# Patient Record
Sex: Male | Born: 1940
Health system: Southern US, Community
[De-identification: ages and names within clinical notes are randomized; demographics above are authoritative.]

## PROBLEM LIST (undated history)

## (undated) DIAGNOSIS — Z950 Presence of cardiac pacemaker: Secondary | ICD-10-CM

## (undated) DIAGNOSIS — K635 Polyp of colon: Secondary | ICD-10-CM

## (undated) DIAGNOSIS — K5790 Diverticulosis of intestine, part unspecified, without perforation or abscess without bleeding: Secondary | ICD-10-CM

## (undated) DIAGNOSIS — I4891 Unspecified atrial fibrillation: Secondary | ICD-10-CM

## (undated) DIAGNOSIS — N184 Chronic kidney disease, stage 4 (severe): Secondary | ICD-10-CM

## (undated) DIAGNOSIS — N400 Enlarged prostate without lower urinary tract symptoms: Secondary | ICD-10-CM

## (undated) DIAGNOSIS — C649 Malignant neoplasm of unspecified kidney, except renal pelvis: Secondary | ICD-10-CM

## (undated) DIAGNOSIS — E785 Hyperlipidemia, unspecified: Secondary | ICD-10-CM

## (undated) DIAGNOSIS — K219 Gastro-esophageal reflux disease without esophagitis: Secondary | ICD-10-CM

## (undated) DIAGNOSIS — C679 Malignant neoplasm of bladder, unspecified: Secondary | ICD-10-CM

## (undated) DIAGNOSIS — E8729 Other acidosis: Secondary | ICD-10-CM

## (undated) DIAGNOSIS — I10 Essential (primary) hypertension: Secondary | ICD-10-CM

## (undated) DIAGNOSIS — I447 Left bundle-branch block, unspecified: Secondary | ICD-10-CM

## (undated) DIAGNOSIS — I2510 Atherosclerotic heart disease of native coronary artery without angina pectoris: Secondary | ICD-10-CM

## (undated) DIAGNOSIS — I495 Sick sinus syndrome: Secondary | ICD-10-CM

## (undated) DIAGNOSIS — I480 Paroxysmal atrial fibrillation: Secondary | ICD-10-CM

## (undated) HISTORY — DX: Benign prostatic hyperplasia without lower urinary tract symptoms: N40.0

## (undated) HISTORY — DX: Essential (primary) hypertension: I10

## (undated) HISTORY — DX: Gastro-esophageal reflux disease without esophagitis: K21.9

## (undated) HISTORY — DX: Polyp of colon: K63.5

## (undated) HISTORY — DX: Malignant neoplasm of bladder, unspecified: C67.9

## (undated) HISTORY — PX: BOWEL RESECTION: SHX1257

## (undated) HISTORY — PX: ROBOT ASSISTED LAPAROSCOPIC COMPLETE CYSTECT ILEAL CONDUIT: SHX5139

## (undated) HISTORY — DX: Diverticulosis of intestine, part unspecified, without perforation or abscess without bleeding: K57.90

## (undated) HISTORY — DX: Malignant neoplasm of unspecified kidney, except renal pelvis: C64.9

## (undated) HISTORY — DX: Unspecified atrial fibrillation: I48.91

## (undated) HISTORY — DX: Hyperlipidemia, unspecified: E78.5

## (undated) HISTORY — PX: APPENDECTOMY: SHX54

---

## 2001-08-11 ENCOUNTER — Ambulatory Visit (HOSPITAL_COMMUNITY): Admission: RE | Admit: 2001-08-11 | Discharge: 2001-08-11 | Payer: Self-pay | Admitting: Gastroenterology

## 2003-09-24 ENCOUNTER — Encounter: Admission: RE | Admit: 2003-09-24 | Discharge: 2003-09-24 | Payer: Self-pay | Admitting: Internal Medicine

## 2005-01-25 HISTORY — PX: NEPHRECTOMY: SHX65

## 2006-01-25 DIAGNOSIS — C649 Malignant neoplasm of unspecified kidney, except renal pelvis: Secondary | ICD-10-CM

## 2006-01-25 HISTORY — PX: BLADDER SURGERY: SHX569

## 2006-01-25 HISTORY — DX: Malignant neoplasm of unspecified kidney, except renal pelvis: C64.9

## 2006-01-27 ENCOUNTER — Observation Stay (HOSPITAL_COMMUNITY): Admission: RE | Admit: 2006-01-27 | Discharge: 2006-01-28 | Payer: Self-pay | Admitting: Urology

## 2006-01-27 ENCOUNTER — Encounter (INDEPENDENT_AMBULATORY_CARE_PROVIDER_SITE_OTHER): Payer: Self-pay | Admitting: Specialist

## 2006-03-23 ENCOUNTER — Encounter: Admission: RE | Admit: 2006-03-23 | Discharge: 2006-03-23 | Payer: Self-pay | Admitting: Urology

## 2006-04-22 ENCOUNTER — Encounter (INDEPENDENT_AMBULATORY_CARE_PROVIDER_SITE_OTHER): Payer: Self-pay | Admitting: Specialist

## 2006-04-22 ENCOUNTER — Ambulatory Visit (HOSPITAL_BASED_OUTPATIENT_CLINIC_OR_DEPARTMENT_OTHER): Admission: RE | Admit: 2006-04-22 | Discharge: 2006-04-22 | Payer: Self-pay | Admitting: Urology

## 2006-04-23 ENCOUNTER — Inpatient Hospital Stay (HOSPITAL_COMMUNITY): Admission: AD | Admit: 2006-04-23 | Discharge: 2006-04-24 | Payer: Self-pay | Admitting: Urology

## 2006-07-19 ENCOUNTER — Encounter (INDEPENDENT_AMBULATORY_CARE_PROVIDER_SITE_OTHER): Payer: Self-pay | Admitting: Urology

## 2006-07-19 ENCOUNTER — Inpatient Hospital Stay (HOSPITAL_COMMUNITY): Admission: RE | Admit: 2006-07-19 | Discharge: 2006-07-22 | Payer: Self-pay | Admitting: Urology

## 2006-07-27 ENCOUNTER — Ambulatory Visit (HOSPITAL_COMMUNITY): Admission: RE | Admit: 2006-07-27 | Discharge: 2006-07-27 | Payer: Self-pay | Admitting: Urology

## 2006-07-28 ENCOUNTER — Emergency Department (HOSPITAL_COMMUNITY): Admission: EM | Admit: 2006-07-28 | Discharge: 2006-07-29 | Payer: Self-pay | Admitting: Emergency Medicine

## 2006-08-10 ENCOUNTER — Ambulatory Visit (HOSPITAL_COMMUNITY): Admission: RE | Admit: 2006-08-10 | Discharge: 2006-08-10 | Payer: Self-pay | Admitting: Urology

## 2006-08-24 ENCOUNTER — Ambulatory Visit (HOSPITAL_COMMUNITY): Admission: RE | Admit: 2006-08-24 | Discharge: 2006-08-24 | Payer: Self-pay | Admitting: Urology

## 2007-12-06 ENCOUNTER — Ambulatory Visit: Payer: Self-pay | Admitting: Internal Medicine

## 2007-12-11 ENCOUNTER — Telehealth: Payer: Self-pay | Admitting: Internal Medicine

## 2007-12-13 ENCOUNTER — Encounter: Payer: Self-pay | Admitting: Internal Medicine

## 2007-12-13 ENCOUNTER — Ambulatory Visit: Payer: Self-pay | Admitting: Internal Medicine

## 2007-12-15 ENCOUNTER — Encounter: Payer: Self-pay | Admitting: Internal Medicine

## 2009-01-16 DIAGNOSIS — K219 Gastro-esophageal reflux disease without esophagitis: Secondary | ICD-10-CM | POA: Diagnosis present

## 2009-01-25 HISTORY — PX: NEPHROSTOMY: SHX1014

## 2009-01-29 DIAGNOSIS — E785 Hyperlipidemia, unspecified: Secondary | ICD-10-CM | POA: Diagnosis present

## 2010-01-25 HISTORY — PX: OTHER SURGICAL HISTORY: SHX169

## 2010-06-09 NOTE — H&P (Signed)
NAME:  Jordan Oconnell, Jordan Oconnell             ACCOUNT NO.:  [id number]   MEDICAL RECORD NO.:  [id number]          PATIENT TYPE:  INP   LOCATION:  1228                         FACILITY:  [facility]   PHYSICIAN:  Domingo Pulse, M.D.  DATE OF BIRTH:  Apr 02, 1940   DATE OF ADMISSION:  07/19/2006  DATE OF DISCHARGE:                              HISTORY & PHYSICAL   CHIEF COMPLAINT:  1. Renal mass.  2. History of bladder cancer.   HISTORY OF PRESENT ILLNESS:  Mr. Dottery is a 70 year old gentleman who  has a history of bladder cancer.  He is status post resection and  treatment with BCG treatment.  His most recent resection was negative  for any residual tumor.  The patient has had a staging evaluation which  revealed the presence of a small tumor on the upper pole of the left  kidney which is worrisome for a renal cell carcinoma.  He is admitted  this hospitalization for surgical management.   The patient states he has been doing well with no recent changes in his  medical history.  He has not had any significant hematuria or trouble  urinating.  He has not had any back or flank pain.   ALLERGIES:  None.   MEDICATIONS:  1. Lisinopril 20 mg daily.  2. Prilosec 20 mg daily.  3. Detrol 4 mg daily.  4. Potassium gluconate 550 mg daily.  5. Ibuprofen as needed.   PAST MEDICAL HISTORY:  1. Bladder cancer.  2. Hypertension.  3. Gastroesophageal reflux disease.   PAST SURGICAL HISTORY:  1. TURBT.  2. Rotator cuff surgery.   FAMILY MEDICAL HISTORY:  Significant for colon cancer in his father and  ovarian cancer in his mother.  There is no history of GU malignancy or  kidney stones.   SOCIAL HISTORY:  The patient denies any current or past use of tobacco  products.  Denies any alcohol.   REVIEW OF SYSTEMS:  Multisystem review was performed is as above in the  history of present illness.  Otherwise reviewed and negative.   EXAMINATION ON ADMISSION:  VITAL SIGNS:  Vital signs were reviewed.   The  patient is afebrile with stable vital signs.  GENERAL:  Well-developed, well-nourished white male in no acute  distress.  HEENT:  Normocephalic, atraumatic.  NECK:  Supple without lymphadenopathy.  CARDIOVASCULAR:  Regular rate and rhythm.  PULMONARY:  Clear to auscultation bilaterally.  ABDOMEN:  Soft, nontender, nondistended.  Normoactive bowel sounds.  GENITOURINARY:  Reveals a normal circumcised phallus.  Both testicles  are descended bilaterally.  EXTREMITIES:  There is no cyanosis, clubbing or edema.  PSYCHIATRIC:  Alert and oriented x3.   IMAGING STUDIES:  CT scan reveals presence of an approximately 2-1/2 cm  upper pole lesion in the left kidney which is enhancing.  There is also  mild left hydronephrosis.  There is some mild left bladder wall  thickening which has decreased in size.  Otherwise negative.   ASSESSMENT/PLAN:  [age]-year-old gentleman with left renal mass,  history of bladder cancer, hypertension, gastroesophageal reflux  disease.   PLAN:  1. The patient  will be taken to the operating room for an attempt at      left partial nephrectomy.  The patient be admitted postoperatively      for routine care.     ______________________________  Peterson Lombard, MD      Domingo Pulse, M.D.  Electronically Signed    JH/MEDQ  D:  07/19/2006  T:  07/19/2006  Job:  [id number]

## 2010-06-09 NOTE — Op Note (Signed)
NAME:  Jordan Oconnell, Jordan Oconnell             ACCOUNT NO.:  [id number]   MEDICAL RECORD NO.:  [id number]          PATIENT TYPE:  INP   LOCATION:  1228                         FACILITY:  [facility]   PHYSICIAN:  Domingo Pulse, M.D.  DATE OF BIRTH:  28-Jun-1940   DATE OF PROCEDURE:  07/19/2006  DATE OF DISCHARGE:                               OPERATIVE REPORT   PREOPERATIVE DIAGNOSIS:  Left renal mass.   POSTOPERATIVE DIAGNOSES:  1. Left renal mass.  2. Bladder neck contracture.   PROCEDURES:  1. Cystoscopy.  2. Dilation of bladder neck contracture.  3. Left partial nephrectomy.   SURGEON:  Domingo Pulse, M.D.   ASSISTANT:  Peterson Lombard, MD.   ANESTHESIA:  General.   SPECIMEN:  Left renal mass for frozen and permanent analysis.   ESTIMATED BLOOD LOSS:  300 mL.   COMPLICATIONS:  None.   DRAINS:  18-French coude catheter, JP to bulb suction.   DISPOSITION:  Stable to post anesthesia care unit.   INDICATIONS FOR PROCEDURE:  Jordan Oconnell is a 70 year old gentleman who  has a history of bladder cancer.  He is status post TURBT with induction  of BCG.  His most recent re-resection was negative for any evidence of  tumor.  As part of his staging evaluation, he was found to have an  approximate 2.5 cm lesion in the medial aspect of the upper pole left  kidney which enhanced and was worrisome for a renal cell carcinoma.  He  was brought to the operating room today for surgical management of his  renal mass.  Full benefits and risks of the procedure were explained and  he has agreed to proceed.  Consent was obtained.   DESCRIPTION OF PROCEDURE:  The patient was brought to the operating room  and was properly identified.  Time-out was performed to confirm the  correct patient, procedure and side.  He was administered general  anesthesia given preoperative antibiotics.  Attempts at placement of  Foley catheter were unsuccessful.  It appeared that the catheter was  becoming blocked at the level  of the bladder neck.  Attempts with a  coude catheter also met with resistance.  We then performed flexible  cystoscopy to assess the urethra and bladder neck.  The anterior urethra  was normal.  Posterior urethra and bladder neck did exhibit a bladder  neck contracture which was approximately 14-French.  This would not  accept the scope.  We then placed a sensor wire under direct vision into  the bladder and the scope was removed.  We then serially dilated the  bladder neck using the male sounds over the wire to 28-French.  The  cystoscope was then reintroduced into the bladder.  We did obtain an  adequate dilation with minimal trauma.  We then performed a cystoscopic  examination of the bladder, given the patient's history of bladder  cancer.  There were no obvious recurrence of the tumor.  There were post  resection changes in the left lateral wall extending posteriorly.  There  did not appear to be any other evidence of tumor.  At this  point, the  cystoscope was removed and a coude catheter was placed easily with  return of clear urine.   The patient was then positioned in the left flank position and properly  padded to prevent compartment syndrome or peripheral neuropathy.  He was  then prepped and draped in the usual sterile manner.  We then began the  partial nephrectomy.  An incision was made just over the eleventh rib.  Dissection was carried down through the subcutaneous tissue and onto the  eleventh rib.  The 3 cm of the rib was freed, cleared off and the rib  was divided with the rib cutter.  We then continued through the  remaining layers.  Peritoneum was swept medially and we entered the  retroperitoneal space.  Kidney was identified as the retroperitoneal  space was developed.  We entered Gerota's fascia and dissected the  perinephric fat off of the kidney.  The kidney was mobilized inferiorly.  We were able to identify the mass coming off the medial aspect of the  upper  pole of the kidney.  This appeared to be about 3 cm in size.  The  adrenal gland and surrounding fat were reflected medially off of the  mass.  We then scored the surrounding capsule with the Bovie cautery.  The mass was then enucleated using the Bovie to remove the mass.  Bleeders were controlled with cautery as well as 3-0 Vicryl suture.  We  then sent the mass to pathology for frozen section.  All margins were  negative.  Preliminary results were consistent with a clear cell renal  cell carcinoma.  We then treated the resection bed with the argon beam.  Hemostasis was excellent at this point.  We then applied FloSeal,  Surgicel and the kidney edges were loosely reapproximated using 2-0  Vicryl over the Surgicel FloSeal bolster.  Tisseel was then applied over  the resection site.  The kidney was examined for several minutes with  excellent hemostasis.  The kidney was then returned to its anatomic  position.  The wound was filled with irrigant and on deep inspiration of  the lung, we did note a small pleurotomy.  This was oversewn using a 3-0  Vicryl suture to close after evacuating all the air in the pleural  space.  The irrigant test was then repeated without any further evidence  of pleural violation.  We then placed a 10 French Jackson-Pratt drain  through a separate stab incision and this was placed in the  retroperitoneum.  The incision was then closed in three layers.  First  layer with a 0 Vicryl.  Then the remaining internal and external layers  were closed with #1 PDS.  The wound was irrigated and the skin was  closed with staples.  Sterile dressing was applied.  The patient was  awoken from anesthesia and transported to the recovery room in stable  condition.  There was no complications.  Please note Dr. Amalia Hailey was  present and participated in all aspects of this procedure as he was  primary surgeon.     ______________________________  Peterson Lombard, MD      Domingo Pulse, M.D.  Electronically Signed    JH/MEDQ  D:  07/19/2006  T:  07/19/2006  Job:  [id number]

## 2010-06-09 NOTE — Discharge Summary (Signed)
NAME:  Jordan Oconnell, Jordan Oconnell             ACCOUNT NO.:  [id number]   MEDICAL RECORD NO.:  [id number]          PATIENT TYPE:  INP   LOCATION:  1426                         FACILITY:  [facility]   PHYSICIAN:  Domingo Pulse, M.D.  DATE OF BIRTH:  05-30-1940   DATE OF ADMISSION:  07/19/2006  DATE OF DISCHARGE:  07/22/2006                               DISCHARGE SUMMARY   ADMISSION DIAGNOSES:  1. Left renal mass.  2. History of bladder cancer.   DISCHARGE DIAGNOSES:  1. Left renal mass.  2. History of bladder cancer.   PROCEDURE PERFORMED:  1. Left partial nephrectomy  2. Cystoscopy with dilation of bladder neck contracture   HISTORY OF PRESENT ILLNESS:  For full details please see dictated  history and physical.  Briefly, Jordan Oconnell is a 70 year old male has a  history of bladder cancer as well as a left-sided renal mass.  He has  been treated with TURBT and BCG for his bladder cancer.  The most recent  resection was negative for evidence of residual tumor.  He has also had  a small approximately 2.5-cm tumor in the upper pole of the left kidney.  He is admitted this hospitalization for management of the renal mass.   HOSPITAL COURSE:  The patient was brought to the hospital on 06/24 and  taken to the operating room for left partial nephrectomy.  The patient  had difficulty with placement of Foley catheter and was found to have a  bladder neck contracture on cystoscopy.  This was dilated and a Foley  catheter was placed.  Cystoscopy at that time did not reveal any  evidence of recurrent tumor.  The patient underwent uneventful open left  partial nephrectomy.  He did have a small pneumothorax postop which was  managed conservatively with resolution on chest x-ray.  His  postoperative course was uncomplicated.  Diet was slowly advanced. He  ambulated without difficulty.  Pain was well controlled.  He did have  continued small amount of drainage from his a JP drain. It was elected  to leave  this in at discharge.   At the time of discharge the patient was afebrile with stable vital  signs.  GENERAL: He was in no acute distress.  HEART regular.  LUNGS clear.  ABDOMEN was soft, nontender.  INCISION was clean, dry and intact with staples in place.  The JP drain  had minimal amount of serous appearing fluid.  EXTREMITIES:  There is no cyanosis, clubbing or edema   DISCHARGE INSTRUCTIONS:  Mr. Yeck was discharged to home in stable and  improved condition.  He is instructed to resume his previous diet.  Instructed not to lift anything heavier than a gallon of milk for  approximately 6 weeks, instructed not to drive for 4 weeks.  Instructed  otherwise to increase his activity slowly and shower.  Instructed how to  record his a JP output and was instructed to call with any significant  increase in drainage amounts.  Was also instructed to call with any  fever greater than 101 or increasing redness or drainage from his  incision or  uncontrolled abdominal or flank pain, nausea, vomiting.  He  understood and agreed to all these instructions.   He will return to see Dr. Amalia Hailey in 1 week.   MEDICATIONS AT DISCHARGE:  1. Lisinopril 20 mg daily  2. Prilosec 20 mg daily.  3. Detrol LA 4 mg daily.  4. Potassium gluconate daily.  5. Ibuprofen as needed.  6. Percocet as needed for pain.  7. Thorazine as needed for hiccups.  8. Laxatives such as Dulcolax or milk of magnesia as needed.   Final pathology was clear cell renal cell carcinoma grade 4/4 with  negative margins.     ______________________________  Peterson Lombard, MD      Domingo Pulse, M.D.  Electronically Signed    JH/MEDQ  D:  07/22/2006  T:  07/22/2006  Job:  [id number]

## 2010-06-09 NOTE — Discharge Summary (Signed)
NAME:  Jordan Oconnell, Jordan Oconnell             ACCOUNT NO.:  [id number]   MEDICAL RECORD NO.:  [id number]          PATIENT TYPE:  INP   LOCATION:  1426                         FACILITY:  [facility]   PHYSICIAN:  Domingo Pulse, M.D.  DATE OF BIRTH:  06-09-1940   DATE OF ADMISSION:  07/19/2006  DATE OF DISCHARGE:  07/22/2006                               DISCHARGE SUMMARY   DISCHARGE DIAGNOSES:  1. Renal cell carcinoma, left kidney.  2. History of bladder cancer.  3. Hypertension.  4. Esophageal reflux disease.   PROCEDURE:  Left partial nephrectomy done on July 19, 2006.   HISTORY:  This 70 year old male has a history of bladder cancer.  As  part of his staging, he was found to have a tumor in the upper pole of  the left kidney felt to be suspicious for renal cell carcinoma.  The  patient has had a successful resection of the tumor and was treated with  BCG therapy.  He had a second look procedure with additional biopsies  taken with no signs of recurrent tumor.  His followup studies have not  shown any change in the size of the kidney tumor.  He is now to have  this resected.  We did give consideration to doing this laparoscopically  but since it is quite high on the left upper pole, we felt it was best  that this was done with an open procedure.  Full informed consent was  obtained.  The patient had the procedure done on the day of admission is  to be admitted following that procedure.   PAST MEDICAL HISTORY:  Is remarkable for:  1. Hypertension.  2. Esophageal reflux disease.   MEDICATIONS:  1. Lisinopril 20 mg daily.  .  Prilosec 20 mg daily.  1. Detrol LA 4 mg daily.  2. Potassium 550 mg daily.  3. Occasional ibuprofen.   Aside from his TURBT, he has had rotator cuff surgery.   FAMILY HISTORY:  Colon cancer and ovarian cancer.   SOCIAL HISTORY:  Unremarkable.  He does not use tobacco or alcohol.   REVIEW OF SYSTEMS:  Was negative and is on the office chart.   The patient was  taken to the operating room, where he underwent  successful exploration with removal of the left renal mass.  As part of  the procedure, he was found to have a tight bladder neck contracture  which was successfully dilated.  Cystoscopic examination showed no  evidence of recurrent tumor.  There were some inflammatory changes in  the area where the patient previously was resected but it was not felt  that there was any tumor present.  The patient's postoperative course  was unremarkable.  We noted on his followup chest x-ray there was a small pneumothorax but  this has begun to resolve.  An additional chest x-ray showed that this  was better and he has one followup x-ray which has not yet been read.  His laboratory studies were unremarkable.  His blood loss was minimally  and he did not require transfusion.  He had no changes in his  electrolytes.  The patient's Jackson-Pratt drain was minimal.  At one point there was a  notation from nursing that there had been a lot of drainage, however, I  checked with the patient and he assured me that there was no change and  this appears to have been placed in error.  In order to be certain that  there was no uncontrolled leakage from his kidney, he will be sent home  with the JP drain so that can be carefully monitored.  It appears that  the drainage right now is less than 100 mL per shift and certainly this  can be removed soon.  The drainage appears to be serous in nature with  no evidence of blood or urine.  The patient's vital signs remained  stable throughout.  He had excellent oxygenation on room air and we felt  that he was ready for discharge by July 22, 2006.  His pain was well-  controlled.  He had no problems with his incision.  He had not yet moved  his bowels, but there was really no signs of problems with that either.  The patient was sent home on July 22, 2006.   He has a prescription for Percocet.  I have also given him Thorazine  for  some hiccups that he has had since the procedure most likely due to some  irritation of the diaphragm, when he was operated upon.   1. He was given instructions in how to use the Jackson-Pratt drain.  2. He will be allowed to shower and resume regular activities,      although he will not do any heavy lifting or driving for several      weeks.  3. He was given prescriptions for the Thorazine and the Percocet as      noted above.  His other medications remain the same including      lisinopril, Prilosec Detrol, potassium, and his over-the-counter      ibuprofen.  4. He has also been given instructions about the use of laxatives.      Domingo Pulse, M.D.  Electronically Signed     RJE/MEDQ  D:  07/22/2006  T:  07/22/2006  Job:  [id number]

## 2010-06-09 NOTE — Op Note (Signed)
NAME:  Jordan Oconnell, Jordan Oconnell             ACCOUNT NO.:  [id number]   MEDICAL RECORD NO.:  [id number]          PATIENT TYPE:  AMB   LOCATION:  DAY                          FACILITY:  [facility]   PHYSICIAN:  Domingo Pulse, M.D.  DATE OF BIRTH:  May 11, 1940   DATE OF PROCEDURE:  DATE OF DISCHARGE:                               OPERATIVE REPORT   PREOPERATIVE DIAGNOSIS:  Urinoma, status post left partial nephrectomy.   POSTOPERATIVE DIAGNOSIS:  Urinoma, status post left partial nephrectomy.   PROCEDURES:  1. Cystoscopy.  2. Left retrograde.  3. Left double-J catheter insertion.   SURGEON:  Domingo Pulse, M.D.   ANESTHESIA:  General.   COMPLICATIONS:  None.   DRAIN:  6-French x 26 cm double-J catheter.   BRIEF HISTORY:  This 70 year old male underwent a recent left partial  nephrectomy.  He had very modest JP drainage although it was noted to  have increased slightly before discharge and for that reason was sent  home with a JP drain in place.  Over the past few days the JP output has  increased significantly, suggesting that the patient has a urinoma.  A  CT scan was obtained, which showed that there did appear to be urinary  extravasation from the upper pole of the kidney coming out through the  JP drain.  The patient is now to undergo stent placement to decompress  the kidney and, hopefully, eliminate these problems.  Consideration was  given to the possible need for a percutaneous nephrostomy, but it was  felt that the had double-J would be easier and would, hopefully, allow  that to dry up and heal.  The patient understands the risks and benefits  of the procedure and gave full informed consent.   PROCEDURE:  After successful induction of general anesthesia, the  patient was placed in the dorsal lithotomy position, prepped with  Betadine and draped in the usual sterile fashion.  Cystoscopy was  formed.  The urethra was visualized in it entirety and was found to be  normal.   Beyond the verumontanum there was a wide-open prostatic fossa.  At the time of the nephrectomy the patient has a bladder neck  contracture following previous TUR biopsies of the prostatic urethra;  however, that area was successfully dilated.  At this point in time it  is wide-open with no obstruction.  The bladder was carefully inspected.  No tumors or stones could be seen.  The right ureteral orifice was  normal in configuration and location.  The left orifice was in an area  that had been involved by that the patient's previous bladder cancer.  That ureter was wide-open and scarred in a somewhat opened position and  because of the previous resection was somewhat irregular in its  appearance.  The area where the patient had had his most recent  resection was healing nicely and no signs of recurrent tumor could be  identified.  The retrograde study was then performed.  A 6-French  ureteral catheter was inserted into the ureter and contrast was  injected.  The ureter was wide open and slightly dilated  from the  previous transurethral surgery.  The collecting system showed that there  was a small leak out of the upper pole.  A guidewire was passed up to  the kidney and was directed towards the lower pole.  The wire was not  directed towards the upper pole out of concern that it might lead to  perforation or extravasation.  A sensor wire was utilized to minimize  trauma.  The 6-French x 26 cm double-J catheter was then passed up over  that guidewire and coiled normally within the lower pole segment.  The  bladder was drained.  The patient's staples were removed and the Steri-  Strips were applied.  He tolerated the procedure well, was taken to the  recovery room in good condition.  He will be monitored carefully for  this over the next week or so.  If there is no real decrease in the  Jackson-Pratt output, a nephrostomy tube will be placed in order to  decompress the kidney further.  It is  certainly felt to be high likely  unlikely that any exploration would be required.      Domingo Pulse, M.D.  Electronically Signed     RJE/MEDQ  D:  07/27/2006  T:  07/27/2006  Job:  [id number]

## 2010-06-12 NOTE — Op Note (Signed)
NAME:  Jordan Oconnell, Jordan Oconnell             ACCOUNT NO.:  [id number]   MEDICAL RECORD NO.:  [id number]          PATIENT TYPE:  AMB   LOCATION:  [facility]                         FACILITY:  [facility]   PHYSICIAN:  Domingo Pulse, M.D.  DATE OF BIRTH:  [birth year]   DATE OF PROCEDURE:  04/22/2006  DATE OF DISCHARGE:                               OPERATIVE REPORT   PREOPERATIVE DIAGNOSIS:  History of transitional cell carcinoma of the  bladder, status post completion of BCG therapy.   POSTOPERATIVE DIAGNOSIS:  History of transitional cell carcinoma of the  bladder, status post completion of BCG therapy.   PROCEDURE:  Repeat transurethral resection of bladder tumor including  resection of prostatic urethra for purposes of restaging.   SURGEON:  Domingo Pulse, M.D.   ANESTHESIA:  General.   COMPLICATIONS:  None.   DRAINS:  A 20 Foley catheter.   BRIEF HISTORY:  This 70 year old male was found to have a fairly  extensive tumor on the left-hand side of the bladder.  He underwent  successful resection of that area.  He has been treated with BCG therapy  with the plan to repeat the resection of that region to determine if  there is any residual tumor present.  The patient did have a CT scan  done at the time of the initial surgery and there was no evidence of  adenopathy, but there certainly was irregularity noted on the bladder  itself.  It was unclear at that time whether this represented any  residual tumor that had not been resected or if it was simply artifact  from the TURBT itself.  An incidental finding of a small lesion on the  upper pole of the left kidney was also noted; this has been placed on  hold until a decision is made as to whether that the patient will  require additional surgery on the bladder.  He is now to undergo a  repeat TURBT including resection of portions of the prostatic urethra  and deep resection of the muscle.  If the patient is found to have  muscle-invasive  disease, he knows that he will have to consider a  cystectomy.  If the patient has involvement of the prostatic urethra,  cystectomy will also be indicated.  If on the other hand there is no  evidence of invasive tumor, he may require additional BCG therapy and  will require a separate procedure to handle the upper pole lesion on the  left-hand side.  The patient understands the risks and benefits of the  procedure including the above-described plan.  He gave full informed  consent.   PROCEDURE:  After successful induction of general anesthesia, the  patient was placed in the dorsal position, prepped with Betadine and  draped in the usual sterile fashion.  The Olympus continuous-flow  resectoscope sheath was inserted using a Timberlake obturator.  The  Stern-McCarthy resectoscope was then inserted with a 12-degree lens in  place.  Inspection showed that the right ureter was normal in its  appearance.  The left ureter was found close to the area of somewhat  irregular tissue  where previous resection had been performed.  The  patient did not have any pedunculated tumor or frond-like masses, but  there were some flat lesions that were certainly red and irregular and  felt to be suspicious for possible carcinoma in situ.  All suspicious  areas were resected with much of the resection going deep into the  muscle.  The area of highest suspicion was on the right-hand side  laterally; this area was fully resected.  Additional chips were also  taken in the prostatic urethra for staging.  Hemostasis was obtained  with the electrocautery.  The chips were all sent for analysis with the  determination being whether there is evidence of tumor in the muscle.  The patient had a 20-French Foley catheter placed; if this drains  normally and he prefers, he may go home this afternoon.  Otherwise we  will keep him overnight and consider removal of the Foley catheter  tomorrow if the urine is clear.            ______________________________  Domingo Pulse, M.D.  Electronically Signed     RJE/MEDQ  D:  04/22/2006  T:  04/22/2006  Job:  [id number]   cc:   Burnard Bunting, M.D.  Fax: [phone]

## 2010-06-12 NOTE — Procedures (Signed)
[place]  Patient:    EDD, ROWDEN Visit Number: [id number] MRN: [id number]          Service Type: END Location: ENDO Attending Physician:  Sherrin Daisy Dictated by:   Joyice Faster. [name], M.D. Proc. Date: 08/11/01 Admit Date:  08/11/2001 Discharge Date: 08/11/2001   CC:         Richard A. Reynaldo Minium, M.D.   Procedure Report  DATE OF BIRTH:  [birth year]  PROCEDURE PERFORMED:  Colonoscopy.  ENDOSCOPIST:  Joyice Faster. [name], M.D.  MEDICATIONS USED:  Fentanyl 80 mcg, Versed 8 mg IV.  INSTRUMENT:  INDICATIONS:  Strong family history of colon cancer.  Brothers, mother and father both had colon cancer.  This is done as a five year follow-up.  DESCRIPTION OF PROCEDURE:  The procedure had been explained to the patient and consent obtained.  With the patient in the left lateral decubitus position, the Olympus pediatric video colonoscope was inserted and advanced under direct visualization.  The prep was excellent and we were able to advance to the cecum using abdominal pressure and position changes.  The ileocecal valve and appendiceal orifice were seen.  The scope was withdrawn.  The cecum, ascending colon, hepatic flexure, transverse colon, splenic flexure, descending and sigmoid colon were seen well upon withdrawal.  No polyps or other lesions seen.  No significant diverticular disease.  Scope withdrawn, patient tolerated the procedure well.  ASSESSMENT:  Strong family history of colon cancer, but no polyps on this procedure.  PLAN:  Will recommend yearly Hemoccults and a five-year follow-up colonoscopy. Dictated by:   Joyice Faster. [name], M.D. Attending Physician:  Sherrin Daisy DD:  08/11/01 TD:  08/16/01 Job: [id number]

## 2010-06-12 NOTE — Op Note (Signed)
NAME:  Jordan Oconnell, Babar Kingstyn             ACCOUNT NO.:  [id number]   MEDICAL RECORD NO.:  [id number]          PATIENT TYPE:  INP   LOCATION:  [id number]                         FACILITY:  [facility]   PHYSICIAN:  Domingo Pulse, M.D.  DATE OF BIRTH:  1940-11-18   DATE OF PROCEDURE:  04/22/2006  DATE OF DISCHARGE:  04/24/2006                               OPERATIVE REPORT   PREOPERATIVE DIAGNOSIS:  History of transurethral resection of bladder  tumor with possible residual tumor.   POSTOPERATIVE DIAGNOSIS:  History of transurethral resection of bladder  tumor with possible residual tumor.   PROCEDURE:  TURBT including TUR of the prostatic fossa.   SURGEON:  Alona Bene, M.D.   ANESTHESIA:  General.   COMPLICATIONS:  None.   DRAINS:  A Foley catheter.   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  CT scan showed no  adenopathy but did show some thickening of the bladder wall as well as  an unexpected finding of a tumor in the upper pole of the left kidney.  The patient is now to undergo repeat TURBT to make sure all residual  tumor has been resected.  He understands the risks and benefits of the  procedure and gave full informed consent.   PROCEDURE:  After successful induction of general anesthesia the patient  was placed in the dorsal lithotomy position, prepped with Betadine and  draped in the usual sterile fashion.  The urethra was dilated to 30-  [place] with [employer] sounds.  The continuous flow resectoscope was then  inserted using a Timberlake obturator.  The resectoscope was inserted.  The patient had inflammatory areas within the left side of the bladder  that might represent simple BCG effect, but certainly could be recurrent  carcinoma in situ.  This area was carefully inspected and full specimens  were sent.  This also included some resection as part of the prostatic  fossa.   All the area was  carefully fulgurated. A Foley catheter was placed.  The  patient will have 23-hour observation status.  He will return to see me  in follow-up for a discussion of the renal mass as well as review of the  pathology.           ______________________________  Domingo Pulse, M.D.  Electronically Signed     RJE/MEDQ  D:  05/19/2006  T:  05/19/2006  Job:  [id number]   cc:   Burnard Bunting, M.D.  Fax: [phone]

## 2010-06-12 NOTE — Op Note (Signed)
NAME:  Jordan Oconnell, Jordan Oconnell             ACCOUNT NO.:  [id number]   MEDICAL RECORD NO.:  [id number]          PATIENT TYPE:  AMB   LOCATION:  [facility]                         FACILITY[facility]   PHYSICIAN:  Domingo Pulse, M.D.  DATE OF BIRTH:  1940-06-01   DATE OF PROCEDURE:  01/27/2006  DATE OF DISCHARGE:                               OPERATIVE REPORT   PREOPERATIVE DIAGNOSIS:  Transitional cell carcinoma of the bladder.   POSTOPERATIVE DIAGNOSIS:  Transitional cell carcinoma of the bladder.   PROCEDURE:  Cystoscopy and transurethral resection bladder tumor.   SURGEON:  Domingo Pulse, MD   ANESTHESIA:  General.   COMPLICATIONS:  None.   DRAINS:  20-French Foley catheter for administration of mitomycin C.   HISTORY:  This 70 year old male developed gross hematuria. As far as his  evaluation, cystoscopy was performed which revealed fairly extensive  tumor on the left-hand side of the bladder, coming up close to the  bladder neck and also appearing to involve the area of the right ureter.  The patient is now to undergo TURBT.  He understands the risks and  benefits of the procedure and gave full informed consent.   PROCEDURE:  After successful induction of general anesthesia, the  patient was placed in the dorsal lithotomy position, prepped with  Betadine and draped in the usual sterile fashion.  Because of concerns  that this would be close the obturator nerve, it was requested that  anesthesia administered muscle relaxants.  The urethra was dilated to 30-  [place] with [employer] sounds.  The continuous flow resectoscope sheath  was then inserted using a Timberlake obturator.  The Iglesias  resectoscope was then inserted with the 12 degrees lens in place.  Careful inspection showed that the right ureter was normal in its  appearance.  The remainder of the bladder appeared quite normal. The  only area in question that was irregular was this fairly extensive air  on the left-hand side.   This did seem to involve the area of the bladder  neck slightly, although there was not significant extension down into  the prostatic fossa.  The left ureter was difficult to identify as it  did appear to be somewhat covered by tumor.  The prostate itself was not  particularly enlarge and the distance between the bladder neck and  verumontanum was quite small.  The patient underwent successful  resection of all the tumor. This was taken down to a flat base and it  did not have the appearance of muscle invasion, as it appears to be very  superficial.  There did appear to be some superficial involvement  extending towards the bladder neck and where necessary, the resection  did extend slightly into the prostate, however, formal resection of  prostate was not performed since the patient did have concerns about  incontinence and ejaculatory dysfunction, and did not wish to have this  done.  It may prove to be necessary in the future depending on pathology  report and follow-up cystoscopy, but was not felt to be required at this  time.  At the end of  the procedure all chips were irrigated from the  bladder.  The area was fulgurated and some of fulguration did extend  slightly into the prostatic fossa, but as noted above the prostate was  left intact as much as possible.  The final inspection showed no  residual tumor was left.  The left ureter was difficult to identify and  will require project imaging studies for further evaluation.  The right  ureter, however, was completely normal.  The hemostasis was acceptable.  The cystoscope was removed.  A 20-French catheter was inserted.  Mitomycin was instilled as we  allowed it to indwell, and then the patient will be left with a catheter  and leg bag.  We sent him home with Lorcet Plus, Pyridium Plus and  Septra DS.  He will return in follow-up for review of pathology report  as well as the initiation of BCG therapy.            ______________________________  Domingo Pulse, M.D.  Electronically Signed     RJE/MEDQ  D:  01/27/2006  T:  01/27/2006  Job:  [id number]   cc:   Burnard Bunting, M.D.  Fax: [phone]

## 2010-09-04 ENCOUNTER — Inpatient Hospital Stay (HOSPITAL_COMMUNITY): Payer: Medicare Other

## 2010-09-04 ENCOUNTER — Inpatient Hospital Stay (HOSPITAL_COMMUNITY)
Admission: AD | Admit: 2010-09-04 | Discharge: 2010-09-05 | DRG: 310 | Disposition: A | Discharge status: Home / Self Care | Payer: Medicare Other | Source: Ambulatory Visit | Attending: Internal Medicine | Admitting: Internal Medicine

## 2010-09-04 DIAGNOSIS — N189 Chronic kidney disease, unspecified: Secondary | ICD-10-CM | POA: Diagnosis present

## 2010-09-04 DIAGNOSIS — Z79899 Other long term (current) drug therapy: Secondary | ICD-10-CM

## 2010-09-04 DIAGNOSIS — K219 Gastro-esophageal reflux disease without esophagitis: Secondary | ICD-10-CM | POA: Diagnosis present

## 2010-09-04 DIAGNOSIS — I129 Hypertensive chronic kidney disease with stage 1 through stage 4 chronic kidney disease, or unspecified chronic kidney disease: Secondary | ICD-10-CM | POA: Diagnosis present

## 2010-09-04 DIAGNOSIS — R0602 Shortness of breath: Secondary | ICD-10-CM

## 2010-09-04 DIAGNOSIS — Z8551 Personal history of malignant neoplasm of bladder: Secondary | ICD-10-CM

## 2010-09-04 DIAGNOSIS — I4891 Unspecified atrial fibrillation: Principal | ICD-10-CM | POA: Diagnosis present

## 2010-09-04 DIAGNOSIS — Z85528 Personal history of other malignant neoplasm of kidney: Secondary | ICD-10-CM

## 2010-09-04 LAB — D-DIMER, QUANTITATIVE: D-Dimer, Quant: 3.85 ug/mL-FEU — ABNORMAL HIGH (ref 0.00–0.48)

## 2010-09-04 LAB — BASIC METABOLIC PANEL
CO2: 25 mEq/L (ref 19–32)
Calcium: 9.8 mg/dL (ref 8.4–10.5)
Glucose, Bld: 113 mg/dL — ABNORMAL HIGH (ref 70–99)
Potassium: 4.7 mEq/L (ref 3.5–5.1)

## 2010-09-04 LAB — HEPATIC FUNCTION PANEL
Albumin: 3.5 g/dL (ref 3.5–5.2)
Indirect Bilirubin: 0.5 mg/dL (ref 0.3–0.9)

## 2010-09-04 LAB — TSH: TSH: 1.381 u[IU]/mL (ref 0.350–4.500)

## 2010-09-04 LAB — APTT: aPTT: 32 seconds (ref 24–37)

## 2010-09-04 LAB — CBC
HCT: 44.1 % (ref 39.0–52.0)
MCV: 88.9 fL (ref 78.0–100.0)
RDW: 12.8 % (ref 11.5–15.5)

## 2010-09-04 LAB — T4, FREE: Free T4: 1.26 ng/dL (ref 0.80–1.80)

## 2010-09-04 MED ORDER — TECHNETIUM TO 99M ALBUMIN AGGREGATED
6.0000 | Freq: Once | INTRAVENOUS | Status: AC | PRN
  Administered 2010-09-04: 6 via INTRAVENOUS

## 2010-09-04 MED ORDER — XENON XE 133 GAS
10.0000 | GAS_FOR_INHALATION | Freq: Once | RESPIRATORY_TRACT | Status: AC | PRN
  Administered 2010-09-04: 10 via RESPIRATORY_TRACT

## 2010-09-05 LAB — URINALYSIS, MICROSCOPIC ONLY
Bilirubin Urine: NEGATIVE
Glucose, UA: NEGATIVE mg/dL
Ketones, ur: NEGATIVE mg/dL
Nitrite: NEGATIVE
Specific Gravity, Urine: 1.013 (ref 1.005–1.030)
pH: 6 (ref 5.0–8.0)

## 2010-09-08 HISTORY — PX: NM MYOCAR PERF WALL MOTION: HXRAD629

## 2010-09-08 HISTORY — PX: US ECHOCARDIOGRAPHY: HXRAD669

## 2010-09-11 NOTE — Discharge Summary (Signed)
Jordan Oconnell, Jordan Oconnell             ACCOUNT NO.:  [id number]  MEDICAL RECORD NO.:  [id number]  LOCATION:  [id number]                         FACILITY:  [facility]  PHYSICIAN:  Burnard Bunting, M.D.  DATE OF BIRTH:  01-May-1940  DATE OF ADMISSION:  09/04/2010 DATE OF DISCHARGE:  09/05/2010                              DISCHARGE SUMMARY   DIAGNOSES AT THE TIME OF DISCHARGE: 1. Paroxysmal atrial fibrillation with a rapid ventricular response,     currently converted to normal sinus rhythm. 2. Essential hypertension. 3. Chronic kidney disease. 4. Renal cell carcinoma, status post partial nephrectomy, followed at     [facility]:  [name] is a pleasant 70 year old gentleman well known to myself with renal cell carcinoma and reoccurrence, hypertension, presenting at this time with breathlessness and found to be in atrial fibrillation with rapid ventricular response in the office.  Symptoms have been going on for 1-2 weeks precipitated by recurring after a protracted road trip up and down the [employer] by car and 20-mile bike ride.  He has had no chest pain, nausea, or vomiting, but does relate to be breathless.  He was seen in the office, found to be in atrial fibrillation with rapid ventricular response, and is admitted for further management.  For details, please see our electronically printed record/H and P placed on the chart and sent over from the hospital.  LABORATORY DATA:  Nuclear medicine pulmonary ventilation scan very low probability, no evidence of PE.  Chest x-ray, no active cardiopulmonary disease.  Urinalysis:  Large leukocytes, negative nitrites, 3-6 red cells.  T3 of 1.38 and T4 of 1.26.  CBC:  Hemoglobin 15.2, hematocrit 44, white blood count is 12.0, and platelet count 298,000.  Chemistry: Sodium 135, potassium 4.7, chloride 100, CO2 of 26, glucose 113, BUN 34, creatinine 1.81, calcium 9.8.  HOSPITAL COURSE:  The patient was admitted, placed on IV  Cardizem, and ultimately following 2 boluses using an upward titration converted during the early morning hours to sinus rhythm.  He has remained in sinus rhythm since that time and has been converted to oral Cardizem. We did perform a V/Q scan given his history of malignancy with no evidence of pulmonary embolus.  Thyroid function testing was negative. Further testing to include echocardiogram and Myoview will be conducted in the outpatient setting.  He has been seen by Cardiology in consultation, [place] and [facility] and they have approved discharge with again further to occur in the outpatient setting.  He is ambulating without difficulty and back to baseline with no concerns.  DISCHARGE MEDICATIONS: 1. Aspirin 81 mg daily. 2. Diltiazem CD 180 one daily. 3. Lisinopril 20 mg daily. 4. K-Dur 20 daily. 5. Prilosec 20 daily. 6. Over-the-counter vitamin D.  Again, he will follow up with [facility] and Vascular within the next week for his outpatient testing.  He is currently no restrictions. He is discharged in improved and stable condition, further outpatient. He does have some pyuria, but he is currently asymptomatic, therefore fluids will be encouraged.          ______________________________ Burnard Bunting, M.D.     RA/MEDQ  D:  09/05/2010  T:  09/05/2010  Job:  [id number]  cc:   [facility]  Electronically Signed by Burnard Bunting M.D. on 09/11/2010 10:50:47 AM

## 2010-11-10 LAB — BASIC METABOLIC PANEL
BUN: 24 — ABNORMAL HIGH
CO2: 29
Calcium: 9.1
Chloride: 94 — ABNORMAL LOW
Creatinine, Ser: 1.56 — ABNORMAL HIGH
GFR calc Af Amer: 54 — ABNORMAL LOW
GFR calc non Af Amer: 45 — ABNORMAL LOW
Glucose, Bld: 120 — ABNORMAL HIGH
Potassium: 4.9
Sodium: 129 — ABNORMAL LOW

## 2010-11-10 LAB — DIFFERENTIAL
Basophils Absolute: 0.1
Basophils Relative: 0
Eosinophils Absolute: 1 — ABNORMAL HIGH
Eosinophils Relative: 8 — ABNORMAL HIGH
Lymphocytes Relative: 11 — ABNORMAL LOW
Lymphs Abs: 1.4
Monocytes Absolute: 0.8 — ABNORMAL HIGH
Monocytes Relative: 7
Neutro Abs: 9 — ABNORMAL HIGH
Neutrophils Relative %: 74

## 2010-11-10 LAB — POCT CARDIAC MARKERS
CKMB, poc: 1.7
CKMB, poc: 3.3
Myoglobin, poc: 174
Operator id: 1192
Troponin i, poc: <0.05

## 2010-11-10 LAB — CBC
HCT: 38 — ABNORMAL LOW
Hemoglobin: 13.2
MCHC: 34.8
MCV: 86.8
Platelets: 346
RBC: 4.38
RDW: 13.2
WBC: 12.2 — ABNORMAL HIGH

## 2010-11-10 LAB — D-DIMER, QUANTITATIVE: D-Dimer, Quant: 1.33 — ABNORMAL HIGH

## 2010-11-11 LAB — CBC
HCT: 38.4 — ABNORMAL LOW
HCT: 39.8
HCT: 47.2
Hemoglobin: 13.6
Hemoglobin: 16
MCHC: 34.6
MCV: 87.6
MCV: 87.6
Platelets: 182
RDW: 13.3
RDW: 13.6
WBC: 12.3 — ABNORMAL HIGH

## 2010-11-11 LAB — BASIC METABOLIC PANEL
BUN: 10
CO2: 28
Chloride: 104
Chloride: 107
Chloride: 110
GFR calc non Af Amer: 60 — ABNORMAL LOW
GFR calc non Af Amer: >60
Glucose, Bld: 104 — ABNORMAL HIGH
Glucose, Bld: 172 — ABNORMAL HIGH
Glucose, Bld: 188 — ABNORMAL HIGH
Potassium: 3.9
Potassium: 4
Potassium: 4.8
Sodium: 140
Sodium: 143

## 2010-11-11 LAB — TYPE AND SCREEN

## 2012-02-25 ENCOUNTER — Encounter: Payer: Self-pay | Admitting: Internal Medicine

## 2012-03-24 ENCOUNTER — Ambulatory Visit: Payer: BC Managed Care – PPO | Admitting: Internal Medicine

## 2012-04-10 ENCOUNTER — Ambulatory Visit: Payer: BC Managed Care – PPO | Admitting: Internal Medicine

## 2012-05-08 ENCOUNTER — Ambulatory Visit: Payer: BC Managed Care – PPO | Admitting: Internal Medicine

## 2012-05-19 ENCOUNTER — Encounter: Payer: Self-pay | Admitting: Internal Medicine

## 2012-05-19 ENCOUNTER — Ambulatory Visit (INDEPENDENT_AMBULATORY_CARE_PROVIDER_SITE_OTHER): Payer: Medicare Other | Admitting: Internal Medicine

## 2012-05-19 VITALS — BP 138/70 | HR 80 | Ht 72.0 in | Wt 194.8 lb

## 2012-05-19 DIAGNOSIS — R195 Other fecal abnormalities: Secondary | ICD-10-CM

## 2012-05-19 DIAGNOSIS — Z800 Family history of malignant neoplasm of digestive organs: Secondary | ICD-10-CM

## 2012-05-19 MED ORDER — MOVIPREP 100 G PO SOLR: 1.0000 | Freq: Once | ORAL | Status: DC

## 2012-05-19 NOTE — Progress Notes (Signed)
HISTORY OF PRESENT ILLNESS:  Jordan Oconnell is a 72 y.o. male with hypertension, bladder cancer, GERD, hyperlipidemia, atrial for ablation, and hyperplastic colon polyps. The patient is sent today for Hemoccult-positive stool as part of routine annual evaluation. Last saw the patient in November of 2009 for screening colonoscopy. That examination revealed colon polyps x3 and sigmoid diverticulosis. The polyps were hyperplastic and followup in 5 years recommended due to a family history of colon cancer in his parent at age 13 and sibling in the 104s patient underwent his routine annual by the way she in January. His chronic medical problems are stable. Aside from the Hemoccult-positive stool, all other laboratories were unremarkable loading normal hemoglobin of 15.8. His GI review of systems is remarkably unremarkable. In particular, no change in bowel habits, bleeding, abdominal pain, or weight loss. He does have a history of hemorrhoids for which he has rarely seen red blood on the tissue with wiping. 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  REVIEW OF SYSTEMS:  All non-GI ROS negative on extensive review  Past Medical History  Diagnosis Date  . Bladder cancer   . Hypertension   . GERD (gastroesophageal reflux disease)   . Renal cell carcinoma 2008    left  . BPH (benign prostatic hyperplasia)   . Hyperlipidemia   . A-fib     with ventricular response  . Diverticulosis   . Colon polyps     Past Surgical History  Procedure Laterality Date  . Bladder surgery  2008    transurethral resection/resection of prostatic urethra  . Nephrectomy  2007    partial, left  . Nephrostomy  2011    stent  . Laparoscopic surgery  2012    laser   (?)    Social History Jordan Oconnell  reports that he has never smoked. He has never used smokeless tobacco. He reports that  drinks alcohol. He reports that he does not use illicit drugs.  family history includes Breast cancer in his  daughter; Colon cancer in his brother, father, and mother; Crohn's disease in his son; and Ovarian cancer in his mother.  Allergies  Allergen Reactions  . Doxycycline Hives       PHYSICAL EXAMINATION: Vital signs: BP 138/70  Pulse 80  Ht 6' (1.829 m)  Wt 194 lb 12.8 oz (88.361 kg)  BMI 26.41 kg/m2 General: Well-developed, well-nourished, no acute distress HEENT: Sclerae are anicteric, conjunctiva pink. Oral mucosa intact Lungs: Clear Heart: Regular Abdomen: soft, nontender, nondistended, no obvious ascites, no peritoneal signs, normal bowel sounds. No organomegaly. Extremities: No edema Psychiatric: alert and oriented x3. Cooperative   ASSESSMENT:  #1. A symptomatically Hemoccult-positive stool with normal hemoglobin #2. Personal history of hyperplastic colon polyps #3. Strong family history of colon cancer in his parent at 13 and sibling at 67 #4. Multiple general medical problems. Stable  PLAN:  #1. Schedule colonoscopy.The nature of the procedure, as well as the risks, benefits, and alternatives were carefully and thoroughly reviewed with the patient. Ample time for discussion and questions allowed. The patient understood, was satisfied, and agreed to proceed. The Movi prep prescribed. Patient instructed on his use

## 2012-05-19 NOTE — Patient Instructions (Addendum)
You have been scheduled for an endoscopy and colonoscopy with propofol. Please follow the written instructions given to you at your visit today. Please pick up your prep at the pharmacy within the next 1-3 days. If you use inhalers (even only as needed), please bring them with you on the day of your procedure.    

## 2012-05-22 ENCOUNTER — Encounter: Payer: Self-pay | Admitting: Internal Medicine

## 2012-06-24 ENCOUNTER — Other Ambulatory Visit: Payer: Self-pay | Admitting: Cardiovascular Disease

## 2012-07-04 ENCOUNTER — Ambulatory Visit (AMBULATORY_SURGERY_CENTER): Payer: Medicare Other | Admitting: Internal Medicine

## 2012-07-04 ENCOUNTER — Encounter: Payer: Self-pay | Admitting: Internal Medicine

## 2012-07-04 VITALS — BP 132/73 | HR 60 | Temp 97.9°F | Resp 16 | Ht 72.0 in | Wt 194.0 lb

## 2012-07-04 DIAGNOSIS — D126 Benign neoplasm of colon, unspecified: Secondary | ICD-10-CM

## 2012-07-04 DIAGNOSIS — Z1211 Encounter for screening for malignant neoplasm of colon: Secondary | ICD-10-CM

## 2012-07-04 DIAGNOSIS — R195 Other fecal abnormalities: Secondary | ICD-10-CM

## 2012-07-04 DIAGNOSIS — Z800 Family history of malignant neoplasm of digestive organs: Secondary | ICD-10-CM

## 2012-07-04 MED ORDER — SODIUM CHLORIDE 0.9 % IV SOLN: 500.0000 mL | INTRAVENOUS | Status: DC

## 2012-07-04 NOTE — Progress Notes (Signed)
Called to room to assist during endoscopic procedure.  Patient ID and intended procedure confirmed with present staff. Received instructions for my participation in the procedure from the performing physician.  

## 2012-07-04 NOTE — Op Note (Signed)
[facility]  [address] [place], [id number]   COLONOSCOPY PROCEDURE REPORT  PATIENT: Jordan Oconnell, Jordan Oconnell  MR#: [id number] BIRTHDATE: 04-10-1940 , 72  yrs. old GENDER: Male ENDOSCOPIST: Eustace Quail, MD REFERRED [id number] Program Recall PROCEDURE DATE:  07/04/2012 PROCEDURE:   Colonoscopy with snare polypectomy    x 1 ASA CLASS:   Class II INDICATIONS:Patient's immediate family history of colon cancer (parent 71; sibling 48's) and Patient's personal history of colon polyps (multiple prior colonoscopies; last exam here 11-2007  with small nonadenomas). MEDICATIONS: MAC sedation, administered by CRNA and propofol (Diprivan) 300mg  IV  DESCRIPTION OF PROCEDURE:   After the risks benefits and alternatives of the procedure were thoroughly explained, informed consent was obtained.  A digital rectal exam revealed no abnormalities of the rectum.   The [id number]  endoscope was introduced through the anus and advanced to the cecum, which was identified by both the appendix and ileocecal valve. No adverse events experienced.   The quality of the prep was excellent, using MoviPrep  The instrument was then slowly withdrawn as the colon was fully examined.      COLON FINDINGS: A diminutive polyp was found in the ascending colon. A polypectomy was performed with a cold snare.  The resection was complete and the polyp tissue was completely retrieved.   Mild diverticulosis was noted The finding was in the left colon.   The colon mucosa was otherwise normal.  Retroflexed views revealed internal hemorrhoids. The time to cecum=2 minutes 32 seconds. Withdrawal time=15 minutes 36 seconds.  The scope was withdrawn and the procedure completed. COMPLICATIONS: There were no complications.  ENDOSCOPIC IMPRESSION: 1.   Diminutive polyp was found in the ascending colon; polypectomy was performed with a cold snare 2.   Mild diverticulosis was noted in the left  colon 3.   The colon mucosa was otherwise normal  RECOMMENDATIONS: 1. Follow up colonoscopy in 5 years   eSigned:  Eustace Quail, MD 07/04/2012 1:49 PM   cc: Burnard Bunting, MD and The Patient   PATIENT NAME:  Jordan Oconnell, Jordan Oconnell MR#: [id number]

## 2012-07-04 NOTE — Progress Notes (Signed)
Patient did not have preoperative order for IV antibiotic SSI prophylaxis. (G8918)  Patient did not experience any of the following events: a burn prior to discharge; a fall within the facility; wrong site/side/patient/procedure/implant event; or a hospital transfer or hospital admission upon discharge from the facility. (G8907)  

## 2012-07-04 NOTE — Patient Instructions (Addendum)
YOU HAD AN ENDOSCOPIC PROCEDURE TODAY AT THE [facility]: Refer to the procedure report that was given to you for any specific questions about what was found during the examination.  If the procedure report does not answer your questions, please call your gastroenterologist to clarify.  If you requested that your care partner not be given the details of your procedure findings, then the procedure report has been included in a sealed envelope for you to review at your convenience later.  YOU SHOULD EXPECT: Some feelings of bloating in the abdomen. Passage of more gas than usual.  Walking can help get rid of the air that was put into your GI tract during the procedure and reduce the bloating. If you had a lower endoscopy (such as a colonoscopy or flexible sigmoidoscopy) you may notice spotting of blood in your stool or on the toilet paper. If you underwent a bowel prep for your procedure, then you may not have a normal bowel movement for a few days.  DIET: Your first meal following the procedure should be a light meal and then it is ok to progress to your normal diet.  A half-sandwich or bowl of soup is an example of a good first meal.  Heavy or fried foods are harder to digest and may make you feel nauseous or bloated.  Likewise meals heavy in dairy and vegetables can cause extra gas to form and this can also increase the bloating.  Drink plenty of fluids but you should avoid alcoholic beverages for 24 hours.  ACTIVITY: Your care partner should take you home directly after the procedure.  You should plan to take it easy, moving slowly for the rest of the day.  You can resume normal activity the day after the procedure however you should NOT DRIVE or use heavy machinery for 24 hours (because of the sedation medicines used during the test).    SYMPTOMS TO REPORT IMMEDIATELY: A gastroenterologist can be reached at any hour.  During normal business hours, 8:30 AM to 5:00 PM Monday through Friday,  call [phone].  After hours and on weekends, please call the GI answering service at [phone] who will take a message and have the physician on call contact you.   Following lower endoscopy (colonoscopy or flexible sigmoidoscopy):  Excessive amounts of blood in the stool  Significant tenderness or worsening of abdominal pains  Swelling of the abdomen that is new, acute  Fever of 100F or higher  Following upper endoscopy (EGD)  Vomiting of blood or coffee ground material  New chest pain or pain under the shoulder blades  Painful or persistently difficult swallowing  New shortness of breath  Fever of 100F or higher  Black, tarry-looking stools  FOLLOW UP: If any biopsies were taken you will be contacted by phone or by letter within the next 1-3 weeks.  Call your gastroenterologist if you have not heard about the biopsies in 3 weeks.  Our staff will call the home number listed on your records the next business day following your procedure to check on you and address any questions or concerns that you may have at that time regarding the information given to you following your procedure. This is a courtesy call and so if there is no answer at the home number and we have not heard from you through the emergency physician on call, we will assume that you have returned to your regular daily activities without incident.  SIGNATURES/CONFIDENTIALITY: You and/or your care   partner have signed paperwork which will be entered into your electronic medical record.  These signatures attest to the fact that that the information above on your After Visit Summary has been reviewed and is understood.  Full responsibility of the confidentiality of this discharge information lies with you and/or your care-partner.   Follow up colonoscopy in 5 years

## 2012-07-04 NOTE — Progress Notes (Signed)
A/ox3 pleased with MAC, report toA/ox3 pleased with MAC, report toWendy Therapist, sports

## 2012-07-05 ENCOUNTER — Telehealth: Payer: Self-pay | Admitting: *Deleted

## 2012-07-05 NOTE — Telephone Encounter (Signed)
  Follow up Call-  Call back number 07/04/2012  Post procedure Call Back phone  # [phone]  Permission to leave phone message Yes     Patient questions:  Do you have a fever, pain , or abdominal swelling? no Pain Score  0 *  Have you tolerated food without any problems? yes  Have you been able to return to your normal activities? yes  Do you have any questions about your discharge instructions: Diet   no Medications  no Follow up visit  no  Do you have questions or concerns about your Care? no  Actions: * If pain score is 4 or above: No action needed, pain <4.

## 2012-07-10 ENCOUNTER — Encounter: Payer: Self-pay | Admitting: Internal Medicine

## 2012-08-25 ENCOUNTER — Encounter: Payer: Self-pay | Admitting: *Deleted

## 2012-08-31 ENCOUNTER — Ambulatory Visit (INDEPENDENT_AMBULATORY_CARE_PROVIDER_SITE_OTHER): Payer: Medicare Other | Admitting: Cardiovascular Disease

## 2012-08-31 ENCOUNTER — Encounter: Payer: Self-pay | Admitting: Cardiovascular Disease

## 2012-08-31 VITALS — BP 144/84 | HR 80 | Resp 14 | Ht 73.0 in | Wt 192.2 lb

## 2012-08-31 DIAGNOSIS — I4891 Unspecified atrial fibrillation: Secondary | ICD-10-CM

## 2012-08-31 DIAGNOSIS — F4321 Adjustment disorder with depressed mood: Secondary | ICD-10-CM

## 2012-08-31 DIAGNOSIS — C689 Malignant neoplasm of urinary organ, unspecified: Secondary | ICD-10-CM

## 2012-08-31 DIAGNOSIS — I10 Essential (primary) hypertension: Secondary | ICD-10-CM

## 2012-08-31 NOTE — Patient Instructions (Addendum)
Your physician recommends that you schedule a follow-up appointment in: One year.  

## 2012-09-03 DIAGNOSIS — I480 Paroxysmal atrial fibrillation: Secondary | ICD-10-CM | POA: Insufficient documentation

## 2012-09-03 DIAGNOSIS — C689 Malignant neoplasm of urinary organ, unspecified: Secondary | ICD-10-CM | POA: Insufficient documentation

## 2012-09-03 DIAGNOSIS — F4320 Adjustment disorder, unspecified: Secondary | ICD-10-CM | POA: Insufficient documentation

## 2012-09-03 DIAGNOSIS — I10 Essential (primary) hypertension: Secondary | ICD-10-CM | POA: Insufficient documentation

## 2012-09-03 DIAGNOSIS — I4891 Unspecified atrial fibrillation: Secondary | ICD-10-CM | POA: Insufficient documentation

## 2012-09-03 DIAGNOSIS — F4321 Adjustment disorder with depressed mood: Secondary | ICD-10-CM | POA: Insufficient documentation

## 2012-09-03 NOTE — Assessment & Plan Note (Signed)
Clearly this is the dominant issue in his life at this time. My perception is that he will spend more time in [place] with his late daughter's family. He mentions taking a medical leave of absence from work.

## 2012-09-03 NOTE — Progress Notes (Signed)
Patient ID: Jordan Oconnell, male   DOB: December 20, 1940, 72 y.o.   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  He has two major negative events in his life. His daughter has succumbed to breast cancer. He has been diagnosed with recurrent urothelial cancer. From a cardiovascular standpoint he is doing very well. He is still walking to 5 miles per day,  until recently was playing tennis.  He is clearly grief stricken and cried today, which is quite unusual for this stoic gentleman.    Allergies  Allergen Reactions  . Doxycycline Hives  . Flomax (Tamsulosin Hcl)     Dizzy     Current Outpatient Prescriptions  Medication Sig Dispense Refill  . ALPRAZolam (XANAX) 0.25 MG tablet Take 0.25 mg by mouth at bedtime as needed for sleep.      Marland Kitchen aspirin 81 MG tablet Take 162 mg by mouth daily.      Marland Kitchen diltiazem (TIAZAC) 180 MG 24 hr capsule Take 180 mg by mouth daily.      . flecainide (TAMBOCOR) 50 MG tablet TAKE ONE TABLET BY MOUTH TWICE DAILY  60 tablet  11  . lisinopril (PRINIVIL,ZESTRIL) 20 MG tablet Take 20 mg by mouth daily.      Marland Kitchen omeprazole (PRILOSEC) 20 MG capsule Take 20 mg by mouth daily.       No current facility-administered medications for this visit.    Past Medical History  Diagnosis Date  . Bladder cancer   . Hypertension   . GERD (gastroesophageal reflux disease)   . Renal cell carcinoma 2008    left  . BPH (benign prostatic hyperplasia)   . A-fib     with ventricular response  . Diverticulosis   . Colon polyps   . Hyperlipidemia     pt denies    Past Surgical History  Procedure Laterality Date  . Bladder surgery  2008    transurethral resection/resection of prostatic urethra  . Nephrectomy  2007    partial, left  . Nephrostomy  2011   stent  . Laparoscopic surgery  2012    laser   (?)  . US echocardiography  0000000    mild diastolic dysfunction,mild dilated LA,mild MR,AI,mildly dilated aortic root  . Nm myocar perf wall motion  09/08/2010    Normal    Family History  Problem Relation Age of Onset  . Ovarian cancer Mother   . Colon cancer Mother   . Colon cancer Father   . Crohn's disease Son   . Breast cancer Daughter   . Colon cancer Brother     History   Social History  . Marital Status: Divorced    Spouse Name: N/A    Number of Children: 2  . Years of Education: N/A   Occupational History  . teacher    Social History Main Topics  . Smoking status: Never Smoker   . Smokeless tobacco: Never Used  . Alcohol Use: Yes     Comment: occasionally  . Drug Use: No  . Sexually Active: Not on file   Other Topics Concern  . Not on file   Social History Narrative  . No narrative on file    Review of systems: The patient specifically denies any chest pain at rest or with exertion, dyspnea at rest or with exertion, orthopnea,  paroxysmal nocturnal dyspnea, syncope, palpitations, focal neurological deficits, intermittent claudication, lower extremity edema, unexplained weight gain, cough, hemoptysis or wheezing.  The patient also denies abdominal pain, nausea, vomiting, dysphagia, diarrhea, constipation, polyuria, polydipsia, dysuria, hematuria, frequency, urgency, abnormal bleeding or bruising, fever, chills, unexpected weight changes, mood swings, change in skin or hair texture, change in voice quality, auditory or visual problems, allergic reactions or rashes, new musculoskeletal complaints other than usual "aches and pains".   PHYSICAL EXAM BP 144/84  Pulse 80  Resp 14  Ht 6\' 1"  (1.854 m)  Wt 192 lb 3.2 oz (87.181 kg)  BMI 25.36 kg/m2  General: Alert, oriented x3, no distress Head: no evidence of trauma, PERRL, EOMI, no exophtalmos or lid lag, no myxedema, no xanthelasma; normal ears, nose and  oropharynx Neck: normal jugular venous pulsations and no hepatojugular reflux; brisk carotid pulses without delay and no carotid bruits Chest: clear to auscultation, no signs of consolidation by percussion or palpation, normal fremitus, symmetrical and full respiratory excursions Cardiovascular: normal position and quality of the apical impulse, regular rhythm, normal first and second heart sounds, no murmurs, rubs or gallops Abdomen: no tenderness or distention, no masses by palpation, no abnormal pulsatility or arterial bruits, normal bowel sounds, no hepatosplenomegaly Extremities: no clubbing, cyanosis or edema; 2+ radial, ulnar and brachial pulses bilaterally; 2+ right femoral, posterior tibial and dorsalis pedis pulses; 2+ left femoral, posterior tibial and dorsalis pedis pulses; no subclavian or femoral bruits Neurological: grossly nonfocal   EKG:  sinus rhythm minor ST segment changes  BMET    Component Value Date/Time   NA 135 09/04/2010 1341   K 4.7 09/04/2010 1341   CL 100 09/04/2010 1341   CO2 25 09/04/2010 1341   GLUCOSE 113* 09/04/2010 1341   BUN 34* 09/04/2010 1341   CREATININE 1.81* 09/04/2010 1341   CALCIUM 9.8 09/04/2010 1341   GFRNONAA 37* 09/04/2010 1341   GFRAA 45* 09/04/2010 1341     ASSESSMENT AND PLAN HTN (hypertension) Excellent control.  Urothelial cancer This is not his first recurrence, but it came as a significant blow since he had thought of himself as being free of cancer for the last 3 years.   Grief reaction Clearly this is the dominant issue in his life at this time. My perception is that he will spend more time in [place] with his late daughter's family. He mentions taking a medical leave of absence from work.  Atrial fibrillation, paroxysmal No recent symptomatic recurrences. Note that he is over 68 years old and is taking a potentially pro arrhythmic agent. He is very active and has no symptoms of angina pectoris. He has undergone stress testing within  the last couple of years with normal results. He has normal left ventricular systolic function and no significant valvular or myocardial problems. Think it is still safe to continue flecainide which has served him well now for a number of years. He is encouraged report any symptoms of chest pain or exertional dyspnea promptly. His CHADS2Vasc2 score is intermediate. It is only slightly elevated secondary to hypertension. His blood pressure is excellent on low dose of anti-hypertensives (one agent actually prescribed as rate control medication). As far as his age concerned from a biological standpoint he is considerably younger. Will continue aspirin for stroke prophylaxis.    Orders Placed This Encounter  Procedures  . EKG 12-Lead     Jordan Oconnell  Sanda Klein, MD, [facility] [phone] office [phone] pager

## 2012-09-03 NOTE — Assessment & Plan Note (Signed)
Excellent control.   

## 2012-09-03 NOTE — Assessment & Plan Note (Signed)
No recent symptomatic recurrences. Note that he is over 72 years old and is taking a potentially pro arrhythmic agent. He is very active and has no symptoms of angina pectoris. He has undergone stress testing within the last couple of years with normal results. He has normal left ventricular systolic function and no significant valvular or myocardial problems. Think it is still safe to continue flecainide which has served him well now for a number of years. He is encouraged report any symptoms of chest pain or exertional dyspnea promptly. His CHADS2Vasc2 score is intermediate. It is only slightly elevated secondary to hypertension. His blood pressure is excellent on low dose of anti-hypertensives (one agent actually prescribed as rate control medication). As far as his age concerned from a biological standpoint he is considerably younger. Will continue aspirin for stroke prophylaxis.

## 2012-09-03 NOTE — Assessment & Plan Note (Signed)
This is not his first recurrence, but it came as a significant blow since he had thought of himself as being free of cancer for the last 3 years.

## 2012-09-14 ENCOUNTER — Ambulatory Visit (HOSPITAL_COMMUNITY)
Admission: RE | Admit: 2012-09-14 | Discharge: 2012-09-14 | Disposition: A | Discharge status: Home / Self Care | Payer: Medicare Other | Source: Ambulatory Visit | Attending: Cardiology | Admitting: Cardiology

## 2012-09-14 ENCOUNTER — Telehealth: Payer: Self-pay | Admitting: Cardiovascular Disease

## 2012-09-14 DIAGNOSIS — I4891 Unspecified atrial fibrillation: Secondary | ICD-10-CM | POA: Insufficient documentation

## 2012-09-14 NOTE — Telephone Encounter (Signed)
Please call asap-Needs an echo today or tomorrow-needs this before he can start chemo-they want him to start chemo next week.

## 2012-09-14 NOTE — Telephone Encounter (Signed)
L. Dorene Ar, NP notified as Dr. Sallyanne Kuster is out of the office today.  VO for echo asap.  Order placed and scheduling notified.  Available appts today at 2pm and tomorrow 7am, 8am, 9am, 1pm.  Returned call and pt informed message received.  Informed of available appts.  Pt wanted to schedule today at 2pm.  Pt also informed of change in billing process r/t hospital outpatient dept.  Pt informed RN can give him the code to give to his insurance company to find out how much out-of-pocket he will have to pay.  Pt stated he wanted to schedule appt and will call [facility] to see if he can get it there.  Pt will call back if he's canceling.  Scheduling notified and appt set for today at 2pm.

## 2012-09-14 NOTE — Progress Notes (Signed)
2D Echo Performed 09/14/2012    [name], RCS

## 2012-10-16 ENCOUNTER — Encounter (HOSPITAL_COMMUNITY): Payer: Self-pay | Admitting: Emergency Medicine

## 2012-10-16 ENCOUNTER — Emergency Department (HOSPITAL_COMMUNITY)
Admission: EM | Admit: 2012-10-16 | Discharge: 2012-10-16 | Disposition: A | Discharge status: Home / Self Care | Payer: Medicare Other | Attending: Emergency Medicine | Admitting: Emergency Medicine

## 2012-10-16 DIAGNOSIS — C649 Malignant neoplasm of unspecified kidney, except renal pelvis: Secondary | ICD-10-CM | POA: Insufficient documentation

## 2012-10-16 DIAGNOSIS — I4891 Unspecified atrial fibrillation: Secondary | ICD-10-CM | POA: Insufficient documentation

## 2012-10-16 DIAGNOSIS — Z9221 Personal history of antineoplastic chemotherapy: Secondary | ICD-10-CM | POA: Insufficient documentation

## 2012-10-16 DIAGNOSIS — I10 Essential (primary) hypertension: Secondary | ICD-10-CM | POA: Insufficient documentation

## 2012-10-16 DIAGNOSIS — E860 Dehydration: Secondary | ICD-10-CM | POA: Insufficient documentation

## 2012-10-16 DIAGNOSIS — N390 Urinary tract infection, site not specified: Secondary | ICD-10-CM

## 2012-10-16 DIAGNOSIS — Z792 Long term (current) use of antibiotics: Secondary | ICD-10-CM | POA: Insufficient documentation

## 2012-10-16 DIAGNOSIS — K219 Gastro-esophageal reflux disease without esophagitis: Secondary | ICD-10-CM | POA: Insufficient documentation

## 2012-10-16 DIAGNOSIS — Z860100 Personal history of colon polyps, unspecified: Secondary | ICD-10-CM | POA: Insufficient documentation

## 2012-10-16 DIAGNOSIS — Z7982 Long term (current) use of aspirin: Secondary | ICD-10-CM | POA: Insufficient documentation

## 2012-10-16 DIAGNOSIS — Z8551 Personal history of malignant neoplasm of bladder: Secondary | ICD-10-CM | POA: Insufficient documentation

## 2012-10-16 DIAGNOSIS — Z79899 Other long term (current) drug therapy: Secondary | ICD-10-CM | POA: Insufficient documentation

## 2012-10-16 LAB — URINALYSIS, ROUTINE W REFLEX MICROSCOPIC
Bilirubin Urine: NEGATIVE
Glucose, UA: NEGATIVE mg/dL
Nitrite: NEGATIVE
Specific Gravity, Urine: 1.016 (ref 1.005–1.030)
pH: 6.5 (ref 5.0–8.0)

## 2012-10-16 LAB — COMPREHENSIVE METABOLIC PANEL
ALT: 44 U/L (ref 0–53)
Albumin: 2.7 g/dL — ABNORMAL LOW (ref 3.5–5.2)
BUN: 43 mg/dL — ABNORMAL HIGH (ref 6–23)
Calcium: 9 mg/dL (ref 8.4–10.5)
Chloride: 91 mEq/L — ABNORMAL LOW (ref 96–112)
Creatinine, Ser: 1.67 mg/dL — ABNORMAL HIGH (ref 0.50–1.35)
Potassium: 4.7 mEq/L (ref 3.5–5.1)
Sodium: 127 mEq/L — ABNORMAL LOW (ref 135–145)
Total Bilirubin: 0.3 mg/dL (ref 0.3–1.2)
Total Protein: 6.5 g/dL (ref 6.0–8.3)

## 2012-10-16 LAB — CBC WITH DIFFERENTIAL/PLATELET
Basophils Absolute: 0 10*3/uL (ref 0.0–0.1)
Eosinophils Absolute: 0 10*3/uL (ref 0.0–0.7)
HCT: 36.6 % — ABNORMAL LOW (ref 39.0–52.0)
Lymphocytes Relative: 11 % — ABNORMAL LOW (ref 12–46)
Lymphs Abs: 1.1 10*3/uL (ref 0.7–4.0)
MCHC: 33.6 g/dL (ref 30.0–36.0)
Neutro Abs: 8 10*3/uL — ABNORMAL HIGH (ref 1.7–7.7)
RDW: 13.4 % (ref 11.5–15.5)

## 2012-10-16 LAB — URINE MICROSCOPIC-ADD ON

## 2012-10-16 MED ORDER — CIPROFLOXACIN HCL 500 MG PO TABS: 500.0000 mg | ORAL_TABLET | Freq: Two times a day (BID) | ORAL | Status: DC

## 2012-10-16 MED ORDER — SODIUM CHLORIDE 0.9 % IV BOLUS (SEPSIS)
1000.0000 mL | Freq: Once | INTRAVENOUS | Status: AC
  Administered 2012-10-16: 1000 mL via INTRAVENOUS

## 2012-10-16 MED ORDER — CIPROFLOXACIN HCL 500 MG PO TABS
500.0000 mg | ORAL_TABLET | Freq: Once | ORAL | Status: AC
  Administered 2012-10-16: 500 mg via ORAL
  Filled 2012-10-16: qty 1

## 2012-10-16 NOTE — Progress Notes (Signed)
EDCM spoke to patient at bedside.  As per patient, he lives alone but has many friends.  Patient reports he does not have any medical equipment at home.  He also reports he is able to walk, feed and dress himself without difficulty.  He currently does not have a home health agency coming to his home.  Patient reports he has a daughter named Claiborne Billings who will be coming to see him on Thursday, and son named Higher education careers adviser.  Patient reports that if he is discharged he will stay with his good friend [name] or she will stay with him.  Patient's friend [name] at bedside confirmed this was true.  EDCM offered support to patient and his friend.  No further needs at this time.

## 2012-10-16 NOTE — ED Notes (Signed)
Pt has been unable to eat or drink in last week. Called oncologist (kidney cancer) at [facility] and they advised that he come in to evaluate for dehydration. Hypotensive- 96/48. Has been lying flat for approx. 1 week. Last chemo was 9/10. Pt gets 4 different kinds of chemo every two weeks. Due on Wednesday this week. Also has ulcerations in mouth that are painful.

## 2012-10-16 NOTE — ED Notes (Signed)
Bed: WA14 Expected date:  Expected time:  Means of arrival:  Comments: No bed, no monitor 

## 2012-10-16 NOTE — ED Provider Notes (Signed)
CSN: [id number]     Arrival date & time 10/16/12  1808 History   First MD Initiated Contact with Patient 10/16/12 1854     Chief Complaint  Patient presents with  . Cancer  . Dehydration   (Consider location/radiation/quality/duration/timing/severity/associated sxs/prior Treatment) HPI This is a 72 year old male with a history of bladder cancer who presents with dysphasia and dehydration. Patient states that he has not felt well since his last chemotherapy treatment which was approximately 10 days ago. He reports a low-grade fever last Monday to 100.2. He endorses chills since that time but no other documented fevers. He reports decreased by mouth intake and mouth soreness. He is taking Magic mouthwash. He has a history of urinary tract infections and endorses frequency without dysuria. He otherwise denies any chest pain, shortness of breath, abdominal pain, focal weakness or numbness. Past Medical History  Diagnosis Date  . Bladder cancer   . Hypertension   . GERD (gastroesophageal reflux disease)   . Renal cell carcinoma 2008    left  . BPH (benign prostatic hyperplasia)   . A-fib     with ventricular response  . Diverticulosis   . Colon polyps   . Hyperlipidemia     pt denies  . Cancer of kidney    Past Surgical History  Procedure Laterality Date  . Bladder surgery  2008    transurethral resection/resection of prostatic urethra  . Nephrectomy  2007    partial, left  . Nephrostomy  2011    stent  . Laparoscopic surgery  2012    laser   (?)  . US echocardiography  0000000    mild diastolic dysfunction,mild dilated LA,mild MR,AI,mildly dilated aortic root  . Nm myocar perf wall motion  09/08/2010    Normal   Family History  Problem Relation Age of Onset  . Ovarian cancer Mother   . Colon cancer Mother   . Colon cancer Father   . Crohn's disease Son   . Breast cancer Daughter   . Colon cancer Brother    History  Substance Use Topics  . Smoking status: Never Smoker    . Smokeless tobacco: Never Used  . Alcohol Use: Yes     Comment: occasionally    Review of Systems  Constitutional: Positive for fever and appetite change.  HENT: Positive for mouth sores.   Respiratory: Negative.  Negative for chest tightness and shortness of breath.   Cardiovascular: Negative.  Negative for chest pain.  Gastrointestinal: Negative.  Negative for abdominal pain.  Genitourinary: Positive for frequency. Negative for dysuria.  Musculoskeletal: Negative for gait problem.  Skin: Negative for rash.  Neurological: Negative for headaches.  All other systems reviewed and are negative.    Allergies  Doxycycline and Flomax  Home Medications   Current Outpatient Rx  Name  Route  Sig  Dispense  Refill  . Alum & Mag Hydroxide-Simeth (MAGIC MOUTHWASH W/LIDOCAINE) SOLN   Oral   Take 2 mLs by mouth every 6 (six) hours.         Marland Kitchen aspirin 81 MG tablet   Oral   Take 162 mg by mouth daily.         Marland Kitchen diltiazem (TIAZAC) 180 MG 24 hr capsule   Oral   Take 180 mg by mouth daily.         . flecainide (TAMBOCOR) 50 MG tablet      TAKE ONE TABLET BY MOUTH TWICE DAILY   60 tablet   11   .  lisinopril (PRINIVIL,ZESTRIL) 20 MG tablet   Oral   Take 20 mg by mouth daily.         Marland Kitchen omeprazole (PRILOSEC) 20 MG capsule   Oral   Take 20 mg by mouth daily.         . ciprofloxacin (CIPRO) 500 MG tablet   Oral   Take 1 tablet (500 mg total) by mouth every 12 (twelve) hours.   14 tablet   0    BP 134/62  Pulse 73  Temp(Src) 101 F (38.3 C) (Rectal)  Resp 23  SpO2 95% Physical Exam  Nursing note and vitals reviewed. Constitutional: He is oriented to person, place, and time. He appears well-developed and well-nourished. No distress.  HENT:  Head: Normocephalic and atraumatic.  Mucositis noted in the oral cavity, no thrush noted  Eyes: Pupils are equal, round, and reactive to light.  Neck: Neck supple.  Cardiovascular: Normal rate, regular rhythm and normal  heart sounds.   No murmur heard. Pulmonary/Chest: Effort normal and breath sounds normal. No respiratory distress. He has no wheezes.  Abdominal: Soft. Bowel sounds are normal. There is no tenderness. There is no rebound.  Musculoskeletal: He exhibits no edema.  Lymphadenopathy:    He has cervical adenopathy.  Neurological: He is alert and oriented to person, place, and time.  Skin: Skin is warm and dry. No rash noted.  Psychiatric: He has a normal mood and affect.    ED Course  Procedures (including critical care time) Labs Review Labs Reviewed  CBC WITH DIFFERENTIAL - Abnormal; Notable for the following:    Hemoglobin 12.3 (*)    HCT 36.6 (*)    Neutrophils Relative % 81 (*)    Lymphocytes Relative 11 (*)    Neutro Abs 8.0 (*)    All other components within normal limits  COMPREHENSIVE METABOLIC PANEL - Abnormal; Notable for the following:    Sodium 127 (*)    Chloride 91 (*)    Glucose, Bld 112 (*)    BUN 43 (*)    Creatinine, Ser 1.67 (*)    Albumin 2.7 (*)    GFR calc non Af Amer 39 (*)    GFR calc Af Amer 45 (*)    All other components within normal limits  URINALYSIS, ROUTINE W REFLEX MICROSCOPIC - Abnormal; Notable for the following:    APPearance TURBID (*)    Hgb urine dipstick LARGE (*)    Protein, ur 30 (*)    Leukocytes, UA LARGE (*)    All other components within normal limits  URINE MICROSCOPIC-ADD ON - Abnormal; Notable for the following:    Bacteria, UA MANY (*)    All other components within normal limits  CULTURE, BLOOD (ROUTINE X 2)  CULTURE, BLOOD (ROUTINE X 2)  URINE CULTURE   Imaging Review No results found.  MDM   1. Urinary tract infection   2. Dehydration    This a 72 year old male who presents with decreased by mouth intake, dehydration, and mouth sores. Initial vital signs were notable for a blood pressure of 96/48. Patient has a history of a remote fever one week ago but denies any fevers since then and is afebrile here.  Basic  labwork was obtained including blood cultures. Lab work is notable for evidence of UTI in the urine. Patient has a history of UTIs and susceptible to Cipro in the past.  Patient was given 2 L of fluid. He was given one dose of aspirin here. Otherwise lab  work is remarkable for a sodium of 127. Creatinine is at baseline.  Low NA may be secondary to dehydration. Blood pressure improved to within normal range while in the ER. Patient had improvement of his symptoms. Patient would like to be discharged home. He is to followup with his primary Dr. and continue Magic mouthwash for symptomatic relief of his mucositis.  After history, exam, and medical workup I feel the patient has been appropriately medically screened and is safe for discharge home. Pertinent diagnoses were discussed with the patient. Patient was given return precautions.    Merryl Hacker, MD 10/16/12 2340

## 2012-10-18 LAB — URINE CULTURE

## 2012-10-20 ENCOUNTER — Telehealth (HOSPITAL_COMMUNITY): Payer: Self-pay | Admitting: Emergency Medicine

## 2012-10-20 NOTE — ED Notes (Signed)
Post ED Visit - Positive Culture Follow-up  Culture report reviewed by antimicrobial stewardship pharmacist: []  Wes Dulaney, Pharm.D., BCPS [x]  Heide Guile, Pharm.D., BCPS []  Alycia Rossetti, Pharm.D., BCPS []  [place], Pharm.D., BCPS, AAHIVP []  Legrand Como, Pharm.D., BCPS, AAHIVP  Positive urine culture Treated with Cipro, organism sensitive to the same and no further patient follow-up is required at this time.  Kylie A Holland 10/20/2012, 1:06 PM

## 2012-10-23 LAB — CULTURE, BLOOD (ROUTINE X 2): Culture: NO GROWTH

## 2012-11-14 ENCOUNTER — Encounter: Payer: Self-pay | Admitting: Cardiovascular Disease

## 2012-11-19 ENCOUNTER — Encounter (HOSPITAL_COMMUNITY): Payer: Self-pay | Admitting: Emergency Medicine

## 2012-11-19 ENCOUNTER — Emergency Department (HOSPITAL_COMMUNITY)
Admission: EM | Admit: 2012-11-19 | Discharge: 2012-11-19 | Disposition: A | Discharge status: Home / Self Care | Payer: Medicare Other | Attending: Emergency Medicine | Admitting: Emergency Medicine

## 2012-11-19 ENCOUNTER — Emergency Department (HOSPITAL_COMMUNITY): Payer: Medicare Other

## 2012-11-19 DIAGNOSIS — I10 Essential (primary) hypertension: Secondary | ICD-10-CM | POA: Insufficient documentation

## 2012-11-19 DIAGNOSIS — I4891 Unspecified atrial fibrillation: Secondary | ICD-10-CM | POA: Insufficient documentation

## 2012-11-19 DIAGNOSIS — R5381 Other malaise: Secondary | ICD-10-CM | POA: Insufficient documentation

## 2012-11-19 DIAGNOSIS — Z8551 Personal history of malignant neoplasm of bladder: Secondary | ICD-10-CM | POA: Insufficient documentation

## 2012-11-19 DIAGNOSIS — R531 Weakness: Secondary | ICD-10-CM

## 2012-11-19 DIAGNOSIS — K5900 Constipation, unspecified: Secondary | ICD-10-CM | POA: Insufficient documentation

## 2012-11-19 DIAGNOSIS — Z7982 Long term (current) use of aspirin: Secondary | ICD-10-CM | POA: Insufficient documentation

## 2012-11-19 DIAGNOSIS — E876 Hypokalemia: Secondary | ICD-10-CM

## 2012-11-19 DIAGNOSIS — Z79899 Other long term (current) drug therapy: Secondary | ICD-10-CM | POA: Insufficient documentation

## 2012-11-19 DIAGNOSIS — Z87448 Personal history of other diseases of urinary system: Secondary | ICD-10-CM | POA: Insufficient documentation

## 2012-11-19 DIAGNOSIS — Z860100 Personal history of colon polyps, unspecified: Secondary | ICD-10-CM | POA: Insufficient documentation

## 2012-11-19 DIAGNOSIS — Z85528 Personal history of other malignant neoplasm of kidney: Secondary | ICD-10-CM | POA: Insufficient documentation

## 2012-11-19 DIAGNOSIS — IMO0002 Reserved for concepts with insufficient information to code with codable children: Secondary | ICD-10-CM | POA: Insufficient documentation

## 2012-11-19 LAB — POCT I-STAT, CHEM 8
Chloride: 89 mEq/L — ABNORMAL LOW (ref 96–112)
Glucose, Bld: 154 mg/dL — ABNORMAL HIGH (ref 70–99)
HCT: 35 % — ABNORMAL LOW (ref 39.0–52.0)
Hemoglobin: 11.9 g/dL — ABNORMAL LOW (ref 13.0–17.0)
Potassium: 3.4 mEq/L — ABNORMAL LOW (ref 3.5–5.1)
Sodium: 127 mEq/L — ABNORMAL LOW (ref 135–145)

## 2012-11-19 LAB — CBC
HCT: 26.4 % — ABNORMAL LOW (ref 39.0–52.0)
Hemoglobin: 9.2 g/dL — ABNORMAL LOW (ref 13.0–17.0)
RDW: 14.8 % (ref 11.5–15.5)
WBC: 7.7 10*3/uL (ref 4.0–10.5)

## 2012-11-19 LAB — OCCULT BLOOD, POC DEVICE: Fecal Occult Bld: NEGATIVE

## 2012-11-19 MED ORDER — FENTANYL CITRATE 0.05 MG/ML IJ SOLN: 50.0000 ug | INTRAMUSCULAR | Status: DC | PRN

## 2012-11-19 MED ORDER — ONDANSETRON HCL 4 MG/2ML IJ SOLN: 4.0000 mg | Freq: Once | INTRAMUSCULAR | Status: DC

## 2012-11-19 MED ORDER — POLYETHYLENE GLYCOL 3350 17 G PO PACK: 17.0000 g | PACK | Freq: Every day | ORAL | Status: DC

## 2012-11-19 MED ORDER — FLEET ENEMA 7-19 GM/118ML RE ENEM
1.0000 | ENEMA | Freq: Once | RECTAL | Status: AC
  Administered 2012-11-19: 1 via RECTAL
  Filled 2012-11-19: qty 1

## 2012-11-19 MED ORDER — POTASSIUM CHLORIDE CRYS ER 20 MEQ PO TBCR
40.0000 meq | EXTENDED_RELEASE_TABLET | Freq: Once | ORAL | Status: AC
  Administered 2012-11-19: 40 meq via ORAL
  Filled 2012-11-19: qty 2

## 2012-11-19 MED ORDER — SODIUM CHLORIDE 0.9 % IV SOLN
INTRAVENOUS | Status: DC
  Administered 2012-11-19: 1000 mL via INTRAVENOUS

## 2012-11-19 NOTE — ED Notes (Signed)
Pt has kidney and bladder cancer has been in intensive treatment,  He feels very weak and hasn't had bowel movement in 3 days,  He drank magnesium citrate and has had no relief,

## 2012-11-19 NOTE — ED Notes (Signed)
Attempting to get urine, patient has unrinal at bedside. MD is aware of unsuccessful attempts a this time, will cont to monitor

## 2012-11-19 NOTE — ED Notes (Signed)
Pt. i-stat Chem 8 results, Potassium 3.4, Hemoglobin 11.9, and Sodium 127. RN,[place] made aware.

## 2012-11-19 NOTE — ED Provider Notes (Signed)
CSN: [id number]     Arrival date & time 11/19/12  [id number] History   First MD Initiated Contact with Patient 11/19/12 [phone]     Chief Complaint  Patient presents with  . Weakness   (Consider location/radiation/quality/duration/timing/severity/associated sxs/prior Treatment) HPI History provided by patient. Presents with generalized weakness and no bowel movement in 3 days. Complains of discomfort related to constipation but denies any abdominal pain. No nausea vomiting. Is followed at [facility] for renal cancer/kidney cancer. No hematuria or difficulty urinating. Patient requesting an enema something to help him have bowel movement. No fevers or chills. He tried magnesium citrate at home without relief and presents here. Last chemotherapy 8 days ago. Past Medical History  Diagnosis Date  . Bladder cancer   . Hypertension   . GERD (gastroesophageal reflux disease)   . Renal cell carcinoma 2008    left  . BPH (benign prostatic hyperplasia)   . A-fib     with ventricular response  . Diverticulosis   . Colon polyps   . Hyperlipidemia     pt denies  . Cancer of kidney    Past Surgical History  Procedure Laterality Date  . Bladder surgery  2008    transurethral resection/resection of prostatic urethra  . Nephrectomy  2007    partial, left  . Nephrostomy  2011    stent  . Laparoscopic surgery  2012    laser   (?)  . US echocardiography  0000000    mild diastolic dysfunction,mild dilated LA,mild MR,AI,mildly dilated aortic root  . Nm myocar perf wall motion  09/08/2010    Normal   Family History  Problem Relation Age of Onset  . Ovarian cancer Mother   . Colon cancer Mother   . Colon cancer Father   . Crohn's disease Son   . Breast cancer Daughter   . Colon cancer Brother    History  Substance Use Topics  . Smoking status: Never Smoker   . Smokeless tobacco: Never Used  . Alcohol Use: Yes     Comment: occasionally    Review of Systems  Constitutional: Negative for fever and  chills.  Respiratory: Negative for shortness of breath.   Cardiovascular: Negative for chest pain.  Gastrointestinal: Positive for constipation. Negative for vomiting.  Genitourinary: Negative for dysuria.  Musculoskeletal: Negative for back pain and neck pain.  Skin: Negative for rash.  Neurological: Negative for dizziness and headaches.  All other systems reviewed and are negative.    Allergies  Doxycycline and Flomax  Home Medications   Current Outpatient Rx  Name  Route  Sig  Dispense  Refill  . aspirin 81 MG tablet   Oral   Take 162 mg by mouth every morning.          Marland Kitchen dexamethasone (DECADRON) 4 MG tablet   Oral   Take 2 tablets by mouth 2 (two) times daily. Take the day after chemo and for two days following Neulasta injection         . diltiazem (TIAZAC) 180 MG 24 hr capsule   Oral   Take 180 mg by mouth every morning.          . escitalopram (LEXAPRO) 10 MG tablet   Oral   Take 1 tablet by mouth every morning.         . flecainide (TAMBOCOR) 50 MG tablet   Oral   Take 50 mg by mouth 2 (two) times daily.         Marland Kitchen  lisinopril (PRINIVIL,ZESTRIL) 20 MG tablet   Oral   Take 20 mg by mouth every morning.           BP 106/70  Pulse 86  Resp 22  SpO2 100% Physical Exam  Constitutional: He is oriented to person, place, and time. He appears well-developed and well-nourished.  HENT:  Head: Normocephalic and atraumatic.  Eyes: EOM are normal. Pupils are equal, round, and reactive to light. No scleral icterus.  Neck: Neck supple.  Cardiovascular: Normal rate, regular rhythm and intact distal pulses.   Pulmonary/Chest: Effort normal and breath sounds normal. No respiratory distress. He exhibits no tenderness.  Abdominal: Soft. Bowel sounds are normal. He exhibits no distension. There is no rebound and no guarding.  Mild tenderness suprapubic but no tenderness otherwise.  Musculoskeletal: Normal range of motion. He exhibits no edema.  Neurological: He  is alert and oriented to person, place, and time.  Skin: Skin is warm and dry.  Pale    ED Course  Procedures (including critical care time) Labs Review Labs Reviewed  CBC - Abnormal; Notable for the following:    RBC 3.19 (*)    Hemoglobin 9.2 (*)    HCT 26.4 (*)    Platelets 147 (*)    All other components within normal limits  POCT I-STAT, CHEM 8 - Abnormal; Notable for the following:    Sodium 127 (*)    Potassium 3.4 (*)    Chloride 89 (*)    Glucose, Bld 154 (*)    Calcium, Ion 1.07 (*)    Hemoglobin 11.9 (*)    HCT 35.0 (*)    All other components within normal limits  URINALYSIS, ROUTINE W REFLEX MICROSCOPIC  OCCULT BLOOD, POC DEVICE   Imaging Review Dg Abd Acute W/chest  11/19/2012   *RADIOLOGY REPORT*  Clinical Data: Constipation for 2-3 days.  ACUTE ABDOMEN SERIES (ABDOMEN 2 VIEW & CHEST 1 VIEW)  Comparison: Chest radiograph performed 09/04/2010, and CT of the abdomen performed 06/23/2007  Findings: The lungs are well-aerated.  Minimal right midlung atelectasis is noted.  There is no evidence of focal opacification, pleural effusion or pneumothorax.  The cardiomediastinal silhouette is within normal limits.  A linear density along the right hemithorax is thought to reflect a skin fold.  The visualized bowel gas pattern is unremarkable.  Scattered stool and air are seen within the colon; there is no evidence of small bowel dilatation to suggest obstruction.  No free intra-abdominal air is identified on the provided decubitus view.  No acute osseous abnormalities are seen; the sacroiliac joints are unremarkable in appearance.  IMPRESSION:  1.  Unremarkable bowel gas pattern; no free intra-abdominal air seen. 2.  Minimal right midlung atelectasis noted.   Original Report Authenticated By: Santa Lighter, M.D.    IV fluids Fleet enema with small bowel movement  7:15 AM on recheck, continues to deny any significant abdominal pain,  States he feels uncomfortable and Would like  to rest but declines admission. Labs pending.  Labs reviewed, is anemic without clinical evidence of active bleeding, has low sodium, potassium, possible dehydration, cont IVFs, potassium provided.  ABD exam remains s/nt/nd.  PT having small BMs, he continues to declines admit, plan discharge home and close PCP f/u. He agrees to strict return precautions, I encouraged miralax and inc fiber in his diet.  MDM  Dx: dehydration, low potassium, dehydration  IVFs, Potassium Treated for constipation Serial evaluations VS and nurses notes revoewed   Teressa Lower, MD 11/20/12 1046

## 2013-03-26 ENCOUNTER — Other Ambulatory Visit (HOSPITAL_COMMUNITY): Payer: Self-pay | Admitting: Internal Medicine

## 2013-03-26 ENCOUNTER — Ambulatory Visit (HOSPITAL_COMMUNITY)
Admission: RE | Admit: 2013-03-26 | Discharge: 2013-03-26 | Disposition: A | Discharge status: Home / Self Care | Payer: Medicare Other | Source: Ambulatory Visit | Attending: Internal Medicine | Admitting: Internal Medicine

## 2013-03-26 ENCOUNTER — Encounter (HOSPITAL_COMMUNITY): Payer: Self-pay

## 2013-03-26 DIAGNOSIS — R52 Pain, unspecified: Secondary | ICD-10-CM

## 2013-03-26 DIAGNOSIS — Z8559 Personal history of malignant neoplasm of other urinary tract organ: Secondary | ICD-10-CM | POA: Insufficient documentation

## 2013-04-20 ENCOUNTER — Ambulatory Visit (HOSPITAL_COMMUNITY)
Admission: RE | Admit: 2013-04-20 | Discharge: 2013-04-20 | Disposition: A | Discharge status: Home / Self Care | Payer: Medicare Other | Source: Ambulatory Visit | Attending: Internal Medicine | Admitting: Internal Medicine

## 2013-04-20 DIAGNOSIS — D649 Anemia, unspecified: Secondary | ICD-10-CM | POA: Insufficient documentation

## 2013-04-20 LAB — PREPARE RBC (CROSSMATCH)

## 2013-04-20 LAB — ABO/RH: ABO/RH(D): O POS

## 2013-04-20 MED ORDER — FUROSEMIDE 10 MG/ML IJ SOLN
20.0000 mg | Freq: Once | INTRAMUSCULAR | Status: AC
  Administered 2013-04-20: 20 mg via INTRAVENOUS
  Filled 2013-04-20 (×2): qty 2

## 2013-04-20 NOTE — Discharge Instructions (Signed)
Blood Transfusion  A blood transfusion replaces your blood or some of its parts. Blood is replaced when you have lost blood because of surgery, an accident, or for severe blood conditions like anemia. You can donate blood to be used on yourself if you have a planned surgery. If you lose blood during that surgery, your own blood can be given back to you. Any blood given to you is checked to make sure it matches your blood type. Your temperature, blood pressure, and heart rate (vital signs) will be checked often.  GET HELP RIGHT AWAY IF:   You feel sick to your stomach (nauseous) or throw up (vomit).  You have watery poop (diarrhea).  You have shortness of breath or trouble breathing.  You have blood in your pee (urine) or have dark colored pee.  You have chest pain or tightness.  Your eyes or skin turn yellow (jaundice).  You have a temperature by mouth above 102 F (38.9 C), not controlled by medicine.  You start to shake and have chills.  You develop a a red rash (hives) or feel itchy.  You develop lightheadedness or feel confused.  You develop back, joint, or muscle pain.  You do not feel hungry (lost appetite).  You feel tired, restless, or nervous.  You develop belly (abdominal) cramps. Document Released: 04/09/2008 Document Revised: 04/05/2011 Document Reviewed: 04/09/2008 Exit[employer] Patient Information 2014 [employer].  

## 2013-04-20 NOTE — Progress Notes (Signed)
Ok to give Lasix with history of dizziness with Flomax

## 2013-04-21 LAB — TYPE AND SCREEN
ABO/RH(D): O POS
Antibody Screen: NEGATIVE
Unit division: 0
Unit division: 0

## 2013-06-06 DIAGNOSIS — F329 Major depressive disorder, single episode, unspecified: Secondary | ICD-10-CM | POA: Diagnosis present

## 2013-07-16 ENCOUNTER — Other Ambulatory Visit: Payer: Self-pay

## 2013-07-16 MED ORDER — FLECAINIDE ACETATE 50 MG PO TABS: 50.0000 mg | ORAL_TABLET | Freq: Two times a day (BID) | ORAL | Status: DC

## 2013-07-16 NOTE — Telephone Encounter (Signed)
Rx was sent to pharmacy electronically. 

## 2013-07-17 ENCOUNTER — Inpatient Hospital Stay (HOSPITAL_COMMUNITY): Payer: Medicare Other

## 2013-07-17 ENCOUNTER — Inpatient Hospital Stay (HOSPITAL_COMMUNITY)
Admission: EM | Admit: 2013-07-17 | Discharge: 2013-07-27 | DRG: 640 | Disposition: A | Discharge status: Skilled Nursing Facility | Payer: Medicare Other | Attending: Internal Medicine | Admitting: Internal Medicine

## 2013-07-17 ENCOUNTER — Encounter (HOSPITAL_COMMUNITY): Payer: Self-pay | Admitting: Emergency Medicine

## 2013-07-17 ENCOUNTER — Emergency Department (HOSPITAL_COMMUNITY): Payer: Medicare Other

## 2013-07-17 DIAGNOSIS — Z803 Family history of malignant neoplasm of breast: Secondary | ICD-10-CM

## 2013-07-17 DIAGNOSIS — E875 Hyperkalemia: Secondary | ICD-10-CM | POA: Diagnosis present

## 2013-07-17 DIAGNOSIS — C674 Malignant neoplasm of posterior wall of bladder: Secondary | ICD-10-CM

## 2013-07-17 DIAGNOSIS — Z85528 Personal history of other malignant neoplasm of kidney: Secondary | ICD-10-CM

## 2013-07-17 DIAGNOSIS — K5730 Diverticulosis of large intestine without perforation or abscess without bleeding: Secondary | ICD-10-CM | POA: Diagnosis present

## 2013-07-17 DIAGNOSIS — C679 Malignant neoplasm of bladder, unspecified: Secondary | ICD-10-CM | POA: Diagnosis present

## 2013-07-17 DIAGNOSIS — K209 Esophagitis, unspecified without bleeding: Secondary | ICD-10-CM | POA: Diagnosis present

## 2013-07-17 DIAGNOSIS — Z800 Family history of malignant neoplasm of digestive organs: Secondary | ICD-10-CM

## 2013-07-17 DIAGNOSIS — F4321 Adjustment disorder with depressed mood: Secondary | ICD-10-CM

## 2013-07-17 DIAGNOSIS — E785 Hyperlipidemia, unspecified: Secondary | ICD-10-CM | POA: Diagnosis present

## 2013-07-17 DIAGNOSIS — N3020 Other chronic cystitis without hematuria: Secondary | ICD-10-CM | POA: Diagnosis present

## 2013-07-17 DIAGNOSIS — R040 Epistaxis: Secondary | ICD-10-CM | POA: Diagnosis present

## 2013-07-17 DIAGNOSIS — K208 Other esophagitis without bleeding: Secondary | ICD-10-CM | POA: Diagnosis present

## 2013-07-17 DIAGNOSIS — R042 Hemoptysis: Secondary | ICD-10-CM | POA: Diagnosis present

## 2013-07-17 DIAGNOSIS — Z860100 Personal history of colon polyps, unspecified: Secondary | ICD-10-CM

## 2013-07-17 DIAGNOSIS — K5900 Constipation, unspecified: Secondary | ICD-10-CM | POA: Diagnosis present

## 2013-07-17 DIAGNOSIS — F4320 Adjustment disorder, unspecified: Secondary | ICD-10-CM

## 2013-07-17 DIAGNOSIS — Z7982 Long term (current) use of aspirin: Secondary | ICD-10-CM

## 2013-07-17 DIAGNOSIS — I10 Essential (primary) hypertension: Secondary | ICD-10-CM | POA: Diagnosis present

## 2013-07-17 DIAGNOSIS — R066 Hiccough: Secondary | ICD-10-CM | POA: Diagnosis present

## 2013-07-17 DIAGNOSIS — R195 Other fecal abnormalities: Secondary | ICD-10-CM

## 2013-07-17 DIAGNOSIS — Z905 Acquired absence of kidney: Secondary | ICD-10-CM

## 2013-07-17 DIAGNOSIS — E860 Dehydration: Secondary | ICD-10-CM | POA: Diagnosis present

## 2013-07-17 DIAGNOSIS — R55 Syncope and collapse: Secondary | ICD-10-CM | POA: Diagnosis present

## 2013-07-17 DIAGNOSIS — D649 Anemia, unspecified: Secondary | ICD-10-CM | POA: Diagnosis present

## 2013-07-17 DIAGNOSIS — D500 Iron deficiency anemia secondary to blood loss (chronic): Secondary | ICD-10-CM

## 2013-07-17 DIAGNOSIS — E878 Other disorders of electrolyte and fluid balance, not elsewhere classified: Secondary | ICD-10-CM

## 2013-07-17 DIAGNOSIS — E871 Hypo-osmolality and hyponatremia: Principal | ICD-10-CM | POA: Diagnosis present

## 2013-07-17 DIAGNOSIS — I4891 Unspecified atrial fibrillation: Secondary | ICD-10-CM | POA: Diagnosis present

## 2013-07-17 DIAGNOSIS — K219 Gastro-esophageal reflux disease without esophagitis: Secondary | ICD-10-CM | POA: Diagnosis present

## 2013-07-17 DIAGNOSIS — Z681 Body mass index (BMI) 19 or less, adult: Secondary | ICD-10-CM

## 2013-07-17 DIAGNOSIS — D509 Iron deficiency anemia, unspecified: Secondary | ICD-10-CM | POA: Diagnosis present

## 2013-07-17 DIAGNOSIS — I480 Paroxysmal atrial fibrillation: Secondary | ICD-10-CM | POA: Diagnosis present

## 2013-07-17 DIAGNOSIS — C689 Malignant neoplasm of urinary organ, unspecified: Secondary | ICD-10-CM

## 2013-07-17 DIAGNOSIS — E43 Unspecified severe protein-calorie malnutrition: Secondary | ICD-10-CM | POA: Diagnosis present

## 2013-07-17 DIAGNOSIS — K449 Diaphragmatic hernia without obstruction or gangrene: Secondary | ICD-10-CM | POA: Diagnosis present

## 2013-07-17 DIAGNOSIS — Z8041 Family history of malignant neoplasm of ovary: Secondary | ICD-10-CM

## 2013-07-17 DIAGNOSIS — Z79899 Other long term (current) drug therapy: Secondary | ICD-10-CM

## 2013-07-17 DIAGNOSIS — J189 Pneumonia, unspecified organism: Secondary | ICD-10-CM | POA: Diagnosis not present

## 2013-07-17 LAB — BASIC METABOLIC PANEL
BUN: 72 mg/dL — ABNORMAL HIGH (ref 6–23)
BUN: 69 mg/dL — ABNORMAL HIGH (ref 6–23)
CALCIUM: 10.7 mg/dL — AB (ref 8.4–10.5)
CALCIUM: 9.9 mg/dL (ref 8.4–10.5)
CO2: 20 mEq/L (ref 19–32)
CO2: 20 mEq/L (ref 19–32)
Chloride: 86 mEq/L — ABNORMAL LOW (ref 96–112)
Chloride: 90 mEq/L — ABNORMAL LOW (ref 96–112)
Creatinine, Ser: 1.32 mg/dL (ref 0.50–1.35)
Creatinine, Ser: 1.28 mg/dL (ref 0.50–1.35)
GFR calc Af Amer: 62 mL/min — ABNORMAL LOW (ref >90)
GFR calc non Af Amer: 51 mL/min — ABNORMAL LOW (ref >90)
GFR, EST AFRICAN AMERICAN: 60 mL/min — AB (ref >90)
GFR, EST NON AFRICAN AMERICAN: 53 mL/min — AB (ref >90)
Glucose, Bld: 125 mg/dL — ABNORMAL HIGH (ref 70–99)
Glucose, Bld: 100 mg/dL — ABNORMAL HIGH (ref 70–99)
POTASSIUM: 6.8 meq/L — AB (ref 3.7–5.3)
POTASSIUM: 6.4 meq/L — AB (ref 3.7–5.3)
SODIUM: 123 meq/L — AB (ref 137–147)
SODIUM: 124 meq/L — AB (ref 137–147)

## 2013-07-17 LAB — URINALYSIS, ROUTINE W REFLEX MICROSCOPIC
BILIRUBIN URINE: NEGATIVE
Glucose, UA: NEGATIVE mg/dL
KETONES UR: NEGATIVE mg/dL
Nitrite: NEGATIVE
PROTEIN: 100 mg/dL — AB
Specific Gravity, Urine: 1.012 (ref 1.005–1.030)
UROBILINOGEN UA: 0.2 mg/dL (ref 0.0–1.0)
pH: 7.5 (ref 5.0–8.0)

## 2013-07-17 LAB — CBC WITH DIFFERENTIAL/PLATELET
BASOS PCT: 0 % (ref 0–1)
Basophils Absolute: 0 10*3/uL (ref 0.0–0.1)
EOS ABS: 0.1 10*3/uL (ref 0.0–0.7)
Eosinophils Relative: 1 % (ref 0–5)
HCT: 32.7 % — ABNORMAL LOW (ref 39.0–52.0)
Hemoglobin: 10.8 g/dL — ABNORMAL LOW (ref 13.0–17.0)
Lymphocytes Relative: 14 % (ref 12–46)
Lymphs Abs: 1.4 10*3/uL (ref 0.7–4.0)
MCH: 28.6 pg (ref 26.0–34.0)
MCHC: 33 g/dL (ref 30.0–36.0)
MCV: 86.7 fL (ref 78.0–100.0)
MONOS PCT: 5 % (ref 3–12)
Monocytes Absolute: 0.5 10*3/uL (ref 0.1–1.0)
Neutro Abs: 8.6 10*3/uL — ABNORMAL HIGH (ref 1.7–7.7)
Neutrophils Relative %: 80 % — ABNORMAL HIGH (ref 43–77)
PLATELETS: 165 10*3/uL (ref 150–400)
RBC: 3.77 MIL/uL — ABNORMAL LOW (ref 4.22–5.81)
RDW: 14.9 % (ref 11.5–15.5)
WBC: 10.7 10*3/uL — ABNORMAL HIGH (ref 4.0–10.5)

## 2013-07-17 LAB — I-STAT CG4 LACTIC ACID, ED: Lactic Acid, Venous: 3.62 mmol/L — ABNORMAL HIGH (ref 0.5–2.2)

## 2013-07-17 LAB — URINE MICROSCOPIC-ADD ON

## 2013-07-17 LAB — POTASSIUM: Potassium: 6.4 mEq/L — ABNORMAL HIGH (ref 3.7–5.3)

## 2013-07-17 MED ORDER — SODIUM CHLORIDE 0.9 % IV BOLUS (SEPSIS)
1000.0000 mL | Freq: Once | INTRAVENOUS | Status: AC
  Administered 2013-07-17: 1000 mL via INTRAVENOUS

## 2013-07-17 MED ORDER — SODIUM CHLORIDE 0.9 % IV SOLN
INTRAVENOUS | Status: DC
  Administered 2013-07-17 – 2013-07-19 (×7): via INTRAVENOUS

## 2013-07-17 MED ORDER — IOHEXOL 300 MG/ML  SOLN
100.0000 mL | Freq: Once | INTRAMUSCULAR | Status: AC | PRN
  Administered 2013-07-17: 100 mL via INTRAVENOUS

## 2013-07-17 MED ORDER — ASPIRIN 81 MG PO CHEW
162.0000 mg | CHEWABLE_TABLET | Freq: Every morning | ORAL | Status: DC
  Administered 2013-07-18 – 2013-07-24 (×7): 162 mg via ORAL
  Filled 2013-07-17 (×9): qty 2

## 2013-07-17 MED ORDER — PANTOPRAZOLE SODIUM 40 MG PO TBEC
40.0000 mg | DELAYED_RELEASE_TABLET | Freq: Every day | ORAL | Status: DC
  Administered 2013-07-18 – 2013-07-24 (×7): 40 mg via ORAL
  Filled 2013-07-17 (×10): qty 1

## 2013-07-17 MED ORDER — FLECAINIDE ACETATE 50 MG PO TABS
50.0000 mg | ORAL_TABLET | Freq: Two times a day (BID) | ORAL | Status: DC
  Administered 2013-07-17 – 2013-07-27 (×20): 50 mg via ORAL
  Filled 2013-07-17 (×24): qty 1

## 2013-07-17 MED ORDER — METHYLPREDNISOLONE SODIUM SUCC 125 MG IJ SOLR
80.0000 mg | Freq: Four times a day (QID) | INTRAMUSCULAR | Status: DC
  Administered 2013-07-17 – 2013-07-18 (×3): 80 mg via INTRAVENOUS
  Filled 2013-07-17 (×7): qty 1.28

## 2013-07-17 MED ORDER — ENOXAPARIN SODIUM 40 MG/0.4ML ~~LOC~~ SOLN
40.0000 mg | Freq: Every morning | SUBCUTANEOUS | Status: DC
  Administered 2013-07-18 – 2013-07-23 (×6): 40 mg via SUBCUTANEOUS
  Filled 2013-07-17 (×8): qty 0.4

## 2013-07-17 MED ORDER — SODIUM POLYSTYRENE SULFONATE 15 GM/60ML PO SUSP
15.0000 g | Freq: Once | ORAL | Status: AC
  Administered 2013-07-17: 15 g via ORAL
  Filled 2013-07-17: qty 60

## 2013-07-17 MED ORDER — MIRTAZAPINE 7.5 MG PO TABS
7.5000 mg | ORAL_TABLET | Freq: Every day | ORAL | Status: DC
  Administered 2013-07-17 – 2013-07-26 (×10): 7.5 mg via ORAL
  Filled 2013-07-17 (×11): qty 1

## 2013-07-17 MED ORDER — CHLORPROMAZINE HCL 25 MG PO TABS
25.0000 mg | ORAL_TABLET | Freq: Three times a day (TID) | ORAL | Status: DC | PRN
  Administered 2013-07-17 – 2013-07-18 (×3): 25 mg via ORAL
  Filled 2013-07-17 (×4): qty 1

## 2013-07-17 MED ORDER — ESCITALOPRAM OXALATE 10 MG PO TABS
10.0000 mg | ORAL_TABLET | Freq: Every morning | ORAL | Status: DC
  Administered 2013-07-18 – 2013-07-27 (×10): 10 mg via ORAL
  Filled 2013-07-17 (×13): qty 1

## 2013-07-17 MED ORDER — CIPROFLOXACIN IN D5W 400 MG/200ML IV SOLN
400.0000 mg | Freq: Two times a day (BID) | INTRAVENOUS | Status: DC
  Administered 2013-07-17 – 2013-07-18 (×2): 400 mg via INTRAVENOUS
  Filled 2013-07-17 (×3): qty 200

## 2013-07-17 MED ORDER — SODIUM CHLORIDE 0.9 % IV BOLUS (SEPSIS)
500.0000 mL | Freq: Once | INTRAVENOUS | Status: AC
  Administered 2013-07-17: 500 mL via INTRAVENOUS

## 2013-07-17 NOTE — Progress Notes (Signed)
  CARE MANAGEMENT ED NOTE 07/17/2013  Patient:  Jordan Oconnell, Jordan Oconnell   Account Number:  [id number]  Date Initiated:  07/17/2013  Documentation initiated by:  Livia Snellen  Subjective/Objective Assessment:   Patient presents to ED with syncopal episode     Subjective/Objective Assessment Detail:   sodium 123, potassium 6.8 BUN 72.     Action/Plan:   Action/Plan Detail:   Anticipated DC Date:       Status Recommendation to Physician:   Result of Recommendation:    Other ED Services  Consult Working [place]  CM consult  Other    Choice offered to / List presented to:  C-1 Patient         [place]    Status of service:  Completed, signed off  ED Comments:   ED Comments Detail:  EDCM spoke to patient at bedside.  Patient reports he lives at home.  Patient stated his son and daughter in law live with him. As per patient, he was in [facility] for rehab after a surgery he had at [facility] and was discharged from [facility] on May 12 2013.  When he went home he has [place] for home health services.  Patient is interested in have physical therapy at home with [facility] upon discharge. Patient's son is Thomasjames [phone] and patient's partners name is [name] [phone].  Patient reports he has a walker , cane, bedside commode at home.  Patient states he uses his bedside commode in the shower for a shower chair.  Patient stated, "I am safe and well taken care of at home."  Patient confirms his pcp is Dr. Reynaldo Minium. [facility] provided patient with a list of home health agencies in [place] highlighting [place].  EDCM explained to patient that he may receive a visiting RN, PT, OT, and social worker if needed.  Patient thankful for assistance. No further EDCM needs at this time.

## 2013-07-17 NOTE — ED Notes (Signed)
Patient refused blood draw for second blood culture.RNs Lanelle Bal and [place] made aware.

## 2013-07-17 NOTE — Progress Notes (Signed)
Utilization Review completed.  Amy Ferrero RN CM  

## 2013-07-17 NOTE — ED Notes (Signed)
Per pt, passed out in bathroom. Fall witnessed and son caught fall.  Pt recently had surgery for bladder/kidney cancer.  Pt has stopped pain meds and was originally having constipation.  LBM yesterday.  Per friend, states he has not been eating or drinking well.  No n/v/d

## 2013-07-17 NOTE — Consult Note (Signed)
CARDIOLOGY CONSULT NOTE  Patient ID: Jordan Oconnell MRN: [id number] DOB/AGE: 02-04-1940 73 y.o.  Admit date: 07/17/2013 Primary Physician [name], MD Primary Cardiologist Dr. Sallyanne Kuster Chief Complaint   Syncope  HPI:  The patient presents with an episode of sycnope.  He has a history of atrial fibrillation with RVR.   Atrial fib has been paroxysmal.  He was treated with flecainide.  He has had usual evaluation with negative stress testing in 2012 and mild concentric hypertrophy and mild AS on echo 08/2012.  He has had a recent complicated history secondary to urethral cancer with hospitalizations and debilitation.    Of note he is found to have a sodium of 123 with potassium elevated at 6.4.  He also has a urinary tract infection.    baskically in bed since last week     Progressively weaker over  Constipation     Got up ok this am  Got u to front room  Felt presycnoepal      Staring eyes open  20 seconds    Recent mild orthostasis but no severe   No again    Past Medical History  Diagnosis Date  . Bladder cancer   . Hypertension   . GERD (gastroesophageal reflux disease)   . Renal cell carcinoma 2008    left  . BPH (benign prostatic hyperplasia)   . A-fib     with ventricular response  . Diverticulosis   . Colon polyps   . Hyperlipidemia     pt denies  . Cancer of kidney     Past Surgical History  Procedure Laterality Date  . Bladder surgery  2008    transurethral resection/resection of prostatic urethra  . Nephrectomy  2007    partial, left  . Nephrostomy  2011    stent  . Laparoscopic surgery  2012    laser   (?)  . US echocardiography  0000000    mild diastolic dysfunction,mild dilated LA,mild MR,AI,mildly dilated aortic root  . Nm myocar perf wall motion  09/08/2010    Normal  . Robot assisted laparoscopic complete cystect ileal conduit    . Appendectomy    . Bowel resection      Allergies  Allergen Reactions  . Doxycycline Hives  . Flomax  [Tamsulosin Hcl] Other (See Comments)    Dizzy    Prescriptions prior to admission  Medication Sig Dispense Refill  . ALPRAZolam (XANAX) 0.25 MG tablet Take 0.25 mg by mouth 3 (three) times daily as needed for anxiety.      Marland Kitchen aspirin 81 MG tablet Take 162 mg by mouth every morning.       . chlorproMAZINE (THORAZINE) 25 MG tablet Take 25 mg by mouth 3 (three) times daily as needed for hiccoughs.      . diltiazem (TIAZAC) 180 MG 24 hr capsule Take 180 mg by mouth every morning.       . enoxaparin (LOVENOX) 40 MG/0.4ML injection Inject 40 mg into the skin every morning.       . escitalopram (LEXAPRO) 10 MG tablet Take 1 tablet by mouth every morning.      . ferrous fumarate (HEMOCYTE - 106 MG FE) 325 (106 FE) MG TABS tablet Take 1 tablet by mouth every evening.      . flecainide (TAMBOCOR) 50 MG tablet Take 50 mg by mouth 2 (two) times daily.      . mirtazapine (REMERON) 7.5 MG tablet Take 7.5 mg by mouth at bedtime.      Marland Kitchen  omeprazole (PRILOSEC) 20 MG capsule Take 20 mg by mouth every morning.      . polyethylene glycol (MIRALAX / GLYCOLAX) packet Take 17 g by mouth daily as needed for mild constipation.      . senna (SENOKOT) 8.6 MG TABS tablet Take 2 tablets by mouth 2 (two) times daily.      . traMADol (ULTRAM) 50 MG tablet Take 50 mg by mouth every 6 (six) hours as needed for moderate pain.      . vitamin C (ASCORBIC ACID) 500 MG tablet Take 500 mg by mouth every morning.       Family History  Problem Relation Age of Onset  . Ovarian cancer Mother   . Colon cancer Mother   . Colon cancer Father   . Crohn's disease Son   . Breast cancer Daughter   . Colon cancer Brother     History   Social History  . Marital Status: Divorced    Spouse Name: N/A    Number of Children: 2  . Years of Education: N/A   Occupational History  . teacher    Social History Main Topics  . Smoking status: Never Smoker   . Smokeless tobacco: Never Used  . Alcohol Use: Yes     Comment: occasionally    . Drug Use: No  . Sexual Activity: Not on file   Other Topics Concern  . Not on file   Social History Narrative  . No narrative on file     ROS:    As stated in the HPI and negative for all other systems.  Physical Exam: Blood pressure 126/67, pulse 96, temperature 97.5 F (36.4 C), temperature source Oral, resp. rate 18, height 6\' 1"  (1.854 m), weight 145 lb 15.1 oz (66.2 kg), SpO2 100.00%.  GENERAL:  Frail appearing HEENT:  Pupils equal round and reactive, fundi not visualized, oral mucosa unremarkable NECK:  No jugular venous distention, waveform within normal limits, carotid upstroke brisk and symmetric, no bruits, no thyromegaly LYMPHATICS:  No cervical, inguinal adenopathy LUNGS:  Clear to auscultation bilaterally BACK:  No CVA tenderness CHEST:  Unremarkable HEART:  PMI not displaced or sustained,S1 and S2 within normal limits, no S3, no S4, no clicks, no rubs, no murmurs ABD:  Flat, positive bowel sounds normal in frequency in pitch, no bruits, no rebound, no guarding, no midline pulsatile mass, no hepatomegaly, no splenomegaly, urostomy EXT:  2 plus pulses throughout, no edema, no cyanosis no clubbing SKIN:  No rashes no nodules NEURO:  Cranial nerves II through XII grossly intact, motor grossly intact throughout, diffuse muscle wasting PSYCH:  Cognitively intact, oriented to person place and time  Labs: Lab Results  Component Value Date   BUN 72* 07/17/2013   Lab Results  Component Value Date   CREATININE 1.32 07/17/2013   Lab Results  Component Value Date   NA 123* 07/17/2013   K 6.4* 07/17/2013   CL 86* 07/17/2013   CO2 20 07/17/2013   No results found for this basename: TROPONINI   Lab Results  Component Value Date   WBC 10.7* 07/17/2013   HGB 10.8* 07/17/2013   HCT 32.7* 07/17/2013   MCV 86.7 07/17/2013   PLT 165 07/17/2013     Radiology:   CXR: Cardiac silhouette is normal in size. Aorta is mildly uncoiled no mediastinal or hilar masses. Lungs are clear.  No pleural effusion.  No pneumothorax.  Bony thorax is demineralized but grossly intact.   EKG:  NSR, rate  84, incomplete RBBB, LAFB, no acute ST T wave changes.  No change from previous.    ASSESSMENT AND PLAN:   SYNCOPE:    Head CT is pending.  Given the description of the event along with evidence of dehydration and marked electrolyte abnormalities, I am less inclined to think that this is a primary arrhythmic event.  He will be monitored in hospital on telemetry.  I will continue the flecainide.  Check orthostatic BPs.    ATRIAL FIB:    As above.    SignedMinus Breeding 07/17/2013, 6:11 PM

## 2013-07-17 NOTE — H&P (Addendum)
PCP:   Jordan Lyons, MD   Chief Complaint:  syncope  HPI: This is a 36 white male with history of recent surgery for urothelial carcinoma and a prolonged difficult postoperative period. He has had a stay both at [facility] and at the nursing home and back to [facility] and now has been home for couple of weeks. His oral intake of liquids has been good but has not been eating as many solids. He's had some significant constipation has been resistant to medications. This morning, he got up and was walking and suddenly became weak and was helped to the floor by his son. He had what appeared to be a staring episode for 20 seconds or so but had no incontinence or jerking movements. He had no prodrome of dizziness pain or other symptoms. His son reported his heart was very fast right after this episode. He had no vomiting. he's had no fevers or chills or sweats. At the present he feels fairly well. He notes no visual change or headache. He has no pain anywhere. He is breathing well. He notes no chest pain. He does feel somewhat hungry. He has no numbness or focal weakness. He's not had syncope in the past. He does have a history of paroxysmal atrial fibrillation and is maintained on diltiazem and flecainide chronically. The last cardiac note I can find is from 2013. An echocardiogram from 2012 showed an preserved ejection fraction and a mildly dilated left atrium. Overall he was making progress up until this episode.  he has had a recurrence of the hiccups which he has been fighting with  Review of Systems:  Review of Systems -  Past Medical History: Past Medical History  Diagnosis Date  . Bladder cancer, urothelial cancer with nephrectomy and diverting urostomy 2014    . Hypertension   . GERD (gastroesophageal reflux disease)   . Renal cell carcinoma 2008    left  . BPH (benign prostatic hyperplasia)   . A-fib     with ventricular response  . Diverticulosis   . Colon polyps   . Hyperlipidemia     pt  denies  . Cancer of kidney    Past Surgical History  Procedure Laterality Date  . Bladder surgery  2008    transurethral resection/resection of prostatic urethra  . Nephrectomy  2007    partial, left  . Nephrostomy  2011    stent  . Laparoscopic surgery  2012    laser   (?)  . US echocardiography  0000000    mild diastolic dysfunction,mild dilated LA,mild MR,AI,mildly dilated aortic root  . Nm myocar perf wall motion  09/08/2010    Normal  . Robot assisted laparoscopic complete cystect ileal conduit    . Appendectomy    . Bowel resection Cystourethroscopy W/Insertion/Exchange Ureteral Stent 08/24/2012 Procedure: CYSTOURETHROSCOPY WITH INSERTION / EXCHANGE URETERAL STENT; Surgeon: Raquel Sarna, MD; Location: [facility]; Service: Urology; Laterality: Bilateral; Cystourethroscopy W/Ureteral Cathization W/Wo Retrograde Pyelogram 08/24/2012 Procedure: CYSTOURETHROSCOPY WITH URETERAL CATHETERIZATION W/WO RETROGRADE PYELOGRAM; Surgeon: Raquel Sarna, MD; Location: [place]; Service: Urology; Laterality: Bilateral; Cystectomy W/Ureteroileal Conduit/Sigmoid Bladder & Lymphadenectomy 02/26/2013 Procedure: CYSTECTOMY WITH REVERSE 7 URINARY DIVERSION; Surgeon: Lysle Rubens, MD; Location: [place]; Service: Urology; Laterality: N/A; ERASEpidural Prostatectomy Retropubic 02/26/2013 Procedure: PROSTATECTOMY; Surgeon: Lysle Rubens, MD; Location: [place]; Service: Urology; Laterality: N/A; Ureterectomy W/Bladder Cuff 02/26/2013 Procedure: URETERECTOMY, WITH BLADDER CUFF; Surgeon: Lysle Rubens, MD; Location: [place]; Service: Urology; Laterality: Bilateral; Abdominal  Lymphadenectomy 02/26/2013 Procedure: ABDOMINAL LYMPHADENECTOMY; Surgeon: Lysle Rubens, MD; Location: [place]; Service: Urology; Laterality: Bilateral; Creation Ileal Conduit Bricker Operation 06/25/2013 Procedure: REVERSE 7 URINARY DIVERSION; Surgeon: Lysle Rubens, MD; Location: [place];  Service: Urology; Laterality: Bilateral; Appendectomy 06/25/2013 Procedure: APPENDECTOMY; Surgeon: Lysle Rubens, MD; Location: [place]; Service: Urology; Laterality: N/A; Cystourethroscopy W/Insertion/Exchange Ureteral Stent 06/25/2013 Procedure: URETERAL STENT; Surgeon: Lysle Rubens, MD; Location: [place]; Service: Urology; Laterality: Bilateral; Lysis Adhesions 06/25/2013 Procedure: ENTEROLYSIS; Surgeon: Lysle Rubens, MD; Location: [place]; Service: Urology; Laterality: N/A; Urological history #1 Urothelial carcinoma of the bladder  -TURBT (03/2006): chronic cystitis  -TURBT (11/2007): papillary hyperplasia  -BCG  -TURBT (05/2008): Ta, low grade, prostatic urethral involvement  -BCG  -TURBT (10/2008): Ta, low grade  -BCG  -TURBT (08/24/2012) Ta, high grade, 2 foci - PET/CT (A999333) hypermetabolic activity in right pelvic sidewall nodes and prostate gland -Cystectomy + bilateral ureterectomy + reverse 7 diversion (02/26/2013) [id number](2/38)[id number]  #2 Left renal pelvic and ureteral urothelial carcinoma  -left ureteroscopy with laser fulguration (09/2009)low grade Ta  -left percutaneous gemcitabine (10/2009) six week induction course. -left ureteroscopy (08/26/2011) negative -left ureteroscopy (08/24/2012) multifocal recurrence in ureter, HG, laser ablation, incomplete -left ureterectomy (02/26/2013)   #3 Right ureteral urothelial carcinoma  -right ureteroscopy (08/26/2011) negative -right ureteroscopy (08/24/2012) multifocal recurrence in ureter, HG, laser ablation, incomplete  -right ureterectomy (02/26/2013)   #3 Clear cell renal cell carcinoma  -Left partial nephrectomy (06/2006): stage/grade unknown        Medications: Prior to Admission medications   Medication Sig Start Date End Date Taking? Authorizing Provider  ALPRAZolam (XANAX) 0.25 MG tablet Take 0.25 mg by mouth 3 (three) times daily as needed for anxiety.   Yes Historical Provider, MD  aspirin 81 MG tablet Take  162 mg by mouth every morning.    Yes Historical Provider, MD  chlorproMAZINE (THORAZINE) 25 MG tablet Take 25 mg by mouth 3 (three) times daily as needed for hiccoughs.   Yes Historical Provider, MD  diltiazem (TIAZAC) 180 MG 24 hr capsule Take 180 mg by mouth every morning.    Yes Historical Provider, MD  enoxaparin (LOVENOX) 40 MG/0.4ML injection Inject 40 mg into the skin every morning.    Yes Historical Provider, MD  escitalopram (LEXAPRO) 10 MG tablet Take 1 tablet by mouth every morning. 11/08/12  Yes Historical Provider, MD  ferrous fumarate (HEMOCYTE - 106 MG FE) 325 (106 FE) MG TABS tablet Take 1 tablet by mouth every evening.   Yes Historical Provider, MD  flecainide (TAMBOCOR) 50 MG tablet Take 50 mg by mouth 2 (two) times daily.   Yes Historical Provider, MD  mirtazapine (REMERON) 7.5 MG tablet Take 7.5 mg by mouth at bedtime.   Yes Historical Provider, MD  omeprazole (PRILOSEC) 20 MG capsule Take 20 mg by mouth every morning.   Yes Historical Provider, MD  polyethylene glycol (MIRALAX / GLYCOLAX) packet Take 17 g by mouth daily as needed for mild constipation.   Yes Historical Provider, MD  senna (SENOKOT) 8.6 MG TABS tablet Take 2 tablets by mouth 2 (two) times daily.   Yes Historical Provider, MD  traMADol (ULTRAM) 50 MG tablet Take 50 mg by mouth every 6 (six) hours as needed for moderate pain.   Yes Historical Provider, MD  vitamin C (ASCORBIC ACID) 500 MG tablet Take 500 mg by mouth every morning.   Yes Historical Provider, MD    Allergies:   Allergies  Allergen Reactions  .  Doxycycline Hives  . Flomax [Tamsulosin Hcl] Other (See Comments)    Dizzy     Social History:  reports that he has never smoked. He has never used smokeless tobacco. He reports that he drinks alcohol. He reports that he does not use illicit drugs. He is divorced with 2 children. He's worked as a Psychologist, counselling at [employer]   Family History: Family History  Problem Relation Age  of Onset  . Ovarian cancer Mother   . Colon cancer Mother   . Colon cancer Father   . Crohn's disease Son   . Breast cancer Daughter   . Colon cancer Brother     Physical Exam: Filed Vitals:   07/17/13 1500 07/17/13 1515 07/17/13 1545 07/17/13 1615  BP:   134/74 126/67  Pulse: 88 87 87 96  Temp:    97.5 F (36.4 C)  TempSrc:      Resp: 24 23 17 18   Height:    6\' 1"  (1.854 m)  Weight:    66.2 kg (145 lb 15.1 oz)  SpO2:    100%   General appearance: thin and a bit frail but nontoxic  Face is symmetric extraocular movements are intact , no nystagmus , gaze is conjugate  Neck: no adenopathy, no carotid bruit, no JVD and thyroid not enlarged, symmetric, no tenderness/mass/nodules no evidence of oral trauma is present  Resp: clear with no wheezes rales or rhonchi. No accessory muscles are in use Cardio: regular and distant and what appears to be sinus rhythm  GI:  right-sided well-healed urostomy is present soft, non-tender; bowel sounds normal; no masses,  no organomegaly, well-healed scars are present  Extremities: extremities normal, atraumatic, no cyanosis or edema Pulses: 2+ and symmetric Neurologic: awake,  Alert and oriented X 3,  Good grip bilaterally. Speech is clear and fluent. No tremor is present.      Labs on Admission:   Recent Labs  07/17/13 1106 07/17/13 1341  NA 123*  --   K 6.8* 6.4*  CL 86*  --   CO2 20  --   GLUCOSE 125*  --   BUN 72*  --   CREATININE 1.32  --   CALCIUM 10.7*  --       Recent Labs  07/17/13 1106  WBC 10.7*  NEUTROABS 8.6*  HGB 10.8*  HCT 32.7*  MCV 86.7  PLT 165   Results for Hantz, Jordan Oconnell (MRN [id number]) as of 07/17/2013 17:46  Ref. Range 07/17/2013 11:46  Color, Urine Latest Range: YELLOW  YELLOW  APPearance Latest Range: CLEAR  TURBID (A)  Specific Gravity, Urine Latest Range: 1.005-1.030  1.012  pH Latest Range: 5.0-8.0  7.5  Glucose Latest Range: NEGATIVE mg/dL NEGATIVE  Bilirubin Urine Latest Range: NEGATIVE   NEGATIVE  Ketones, ur Latest Range: NEGATIVE mg/dL NEGATIVE  Protein Latest Range: NEGATIVE mg/dL 100 (A)  Urobilinogen, UA Latest Range: 0.0-1.0 mg/dL 0.2  Nitrite Latest Range: NEGATIVE  NEGATIVE  Leukocytes, UA Latest Range: NEGATIVE  LARGE (A)  Hgb urine dipstick Latest Range: NEGATIVE  MODERATE (A)  WBC, UA Latest Range: <3 WBC/hpf FIELD OBSCURED BY WBC'S  Bacteria, UA Latest Range: RARE  MANY (A)                Radiological Exams on Admission: Dg Chest Portable 1 View  07/17/2013   CLINICAL DATA:  Loss of consciousness.  Hypertension.  EXAM: PORTABLE CHEST - 1 VIEW  COMPARISON:  11/19/2012.  FINDINGS: Cardiac silhouette is  normal in size. Aorta is mildly uncoiled no mediastinal or hilar masses. Lungs are clear. No pleural effusion. No pneumothorax.  Bony thorax is demineralized but grossly intact.  IMPRESSION: No active disease.   Electronically Signed   By: Lajean Manes M.D.   On: 07/17/2013 12:28   Orders placed during the hospital encounter of 07/17/13  . EKG 12-LEAD: 17-Jul-2013 10:31:00  Sinus rhythm  Prolonged PR interval Incomplete RBBB and LAFB Low voltage, precordial leads Consider anterior infarct since last good quality tracing, new Prolonged PR and incomplete RBBB and LAFB and and and and   .    .   .   .   .   .   .    Labs from [facility] 07/02/13 showed a Na of 132 and K of 3.8 On June 2, Na was 127 and K was 5.3  Assessment/Plan Principal Problem:   Syncope and collapse: This is somewhat of an unusual presentation. Clearly is weak and deconditioned and this may have led to the difficulty. This does not appear to be a pure orthostatic event. The fast heart rate noted by his son make the possibility of a vasovagal episode less likely. He is on some proarrhythmic potential medications for his A. fib. His EKG is fairly unremarkable but does have some conduction delays. This could be a neurogenic syncope as well. A noncontrast CT of the head seems reasonable with  his history of malignancy recently. He is on monitor and is going to be seen by cardiology. His marked electrolyte difficulties may also be playing a role. His neurologic exam is unremarkablen at  the present and I will get not get neurology involved yet. Active Problems:   HTN (hypertension): Blood pressure seems okay. I will check orthostatics   Atrial fibrillation: He is in sinus rhythm   Hyperkalemia: This is quite exaggerated and a repeat will be checked. He could have a cortisol deficiency.   Hyponatremia: This is also quite exaggerated, it was down about 3 weeks ago at [facility] Need to see thyroid and Cortisol status Will draw a cortisol now (6pm) and with the stress of illness, I would expect it to be elevated. Will cover with steroids    Dehydration: will hydrate, BUN is up quite a bit UTI: Rx Cipro 500 BID IV Anemia: a bit worse, follow serially Constipation   Sohan Potvin ALAN 07/17/2013, 5:32 PM

## 2013-07-17 NOTE — ED Provider Notes (Signed)
CSN: [id number]     Arrival date & time 07/17/13  1003 History   First MD Initiated Contact with Patient 07/17/13 1033     Chief Complaint  Patient presents with  . Loss of Consciousness     (Consider location/radiation/quality/duration/timing/severity/associated sxs/prior Treatment) Patient is a 73 y.o. male presenting with syncope. The history is provided by the patient and a friend.  Loss of Consciousness  The patient was sitting on commode, felt weak, stood up and passed out. His son was with him at the time, and assisted him to the floor. The patient was unconscious for 20-30 seconds. He has had decreased appetite for several days, decreased stooling, and anorexia. He had surgery about 3 weeks ago for complications related to prostate cancer. For this, he is followed at [facility]. He denies recent fever, chills, cough, shortness of breath, abdominal pain, back pain or paresthesia. He's taking his regular medications, without relief. He's tried cutting back on narcotic pain medicine because of a sensation of constipation. There are no other known modifying factors.   Past Medical History  Diagnosis Date  . Bladder cancer   . Hypertension   . GERD (gastroesophageal reflux disease)   . Renal cell carcinoma 2008    left  . BPH (benign prostatic hyperplasia)   . A-fib     with ventricular response  . Diverticulosis   . Colon polyps   . Hyperlipidemia     pt denies  . Cancer of kidney    Past Surgical History  Procedure Laterality Date  . Bladder surgery  2008    transurethral resection/resection of prostatic urethra  . Nephrectomy  2007    partial, left  . Nephrostomy  2011    stent  . Laparoscopic surgery  2012    laser   (?)  . US echocardiography  0000000    mild diastolic dysfunction,mild dilated LA,mild MR,AI,mildly dilated aortic root  . Nm myocar perf wall motion  09/08/2010    Normal  . Robot assisted laparoscopic complete cystect ileal conduit    .  Appendectomy    . Bowel resection     Family History  Problem Relation Age of Onset  . Ovarian cancer Mother   . Colon cancer Mother   . Colon cancer Father   . Crohn's disease Son   . Breast cancer Daughter   . Colon cancer Brother    History  Substance Use Topics  . Smoking status: Never Smoker   . Smokeless tobacco: Never Used  . Alcohol Use: Yes     Comment: occasionally    Review of Systems  Cardiovascular: Positive for syncope.  All other systems reviewed and are negative.     Allergies  Doxycycline and Flomax  Home Medications   Prior to Admission medications   Medication Sig Start Date End Date Taking? Authorizing Provider  ALPRAZolam (XANAX) 0.25 MG tablet Take 0.25 mg by mouth 3 (three) times daily as needed for anxiety.   Yes Historical Provider, MD  aspirin 81 MG tablet Take 162 mg by mouth every morning.    Yes Historical Provider, MD  chlorproMAZINE (THORAZINE) 25 MG tablet Take 25 mg by mouth 3 (three) times daily as needed for hiccoughs.   Yes Historical Provider, MD  diltiazem (TIAZAC) 180 MG 24 hr capsule Take 180 mg by mouth every morning.    Yes Historical Provider, MD  enoxaparin (LOVENOX) 40 MG/0.4ML injection Inject 40 mg into the skin every morning.    Yes Historical  Provider, MD  escitalopram (LEXAPRO) 10 MG tablet Take 1 tablet by mouth every morning. 11/08/12  Yes Historical Provider, MD  ferrous fumarate (HEMOCYTE - 106 MG FE) 325 (106 FE) MG TABS tablet Take 1 tablet by mouth every evening.   Yes Historical Provider, MD  flecainide (TAMBOCOR) 50 MG tablet Take 50 mg by mouth 2 (two) times daily.   Yes Historical Provider, MD  mirtazapine (REMERON) 7.5 MG tablet Take 7.5 mg by mouth at bedtime.   Yes Historical Provider, MD  omeprazole (PRILOSEC) 20 MG capsule Take 20 mg by mouth every morning.   Yes Historical Provider, MD  polyethylene glycol (MIRALAX / GLYCOLAX) packet Take 17 g by mouth daily as needed for mild constipation.   Yes Historical  Provider, MD  senna (SENOKOT) 8.6 MG TABS tablet Take 2 tablets by mouth 2 (two) times daily.   Yes Historical Provider, MD  traMADol (ULTRAM) 50 MG tablet Take 50 mg by mouth every 6 (six) hours as needed for moderate pain.   Yes Historical Provider, MD  vitamin C (ASCORBIC ACID) 500 MG tablet Take 500 mg by mouth every morning.   Yes Historical Provider, MD   BP 136/59  Pulse 87  Temp(Src) 98 F (36.7 C) (Oral)  Resp 18  SpO2 100% Physical Exam  Nursing note and vitals reviewed. Constitutional: He is oriented to person, place, and time. He appears well-developed.  Frail, elderly  HENT:  Head: Normocephalic and atraumatic.  Right Ear: External ear normal.  Left Ear: External ear normal.  Eyes: Conjunctivae and EOM are normal. Pupils are equal, round, and reactive to light.  Neck: Normal range of motion and phonation normal. Neck supple.  Cardiovascular: Normal rate, regular rhythm, normal heart sounds and intact distal pulses.   Pulmonary/Chest: Effort normal and breath sounds normal. He exhibits no bony tenderness.  Abdominal: Soft. There is no tenderness.  Musculoskeletal: Normal range of motion.  Neurological: He is alert and oriented to person, place, and time. No cranial nerve deficit or sensory deficit. He exhibits normal muscle tone. Coordination normal.  Skin: Skin is warm, dry and intact.  Psychiatric: He has a normal mood and affect. His behavior is normal. Judgment and thought content normal.    ED Course  Procedures (including critical care time)  Medications  0.9 %  sodium chloride infusion ( Intravenous New Bag/Given 07/17/13 1113)  sodium chloride 0.9 % bolus 500 mL (not administered)  sodium polystyrene (KAYEXALATE) 15 GM/60ML suspension 15 g (not administered)  chlorproMAZINE (THORAZINE) tablet 25 mg (not administered)  sodium chloride 0.9 % bolus 1,000 mL (1,000 mLs Intravenous New Bag/Given 07/17/13 1113)    Patient Vitals for the past 24 hrs:  BP Temp Temp  src Pulse Resp SpO2  07/17/13 1413 - 98 F (36.7 C) Oral - - -  07/17/13 1246 136/59 mmHg 97.7 F (36.5 C) Oral 87 18 100 %  07/17/13 1245 136/59 mmHg - - 88 - -  07/17/13 1230 133/68 mmHg - - 84 - -  07/17/13 1215 105/72 mmHg - - 87 - -  07/17/13 1200 139/69 mmHg - - 79 19 -  07/17/13 1145 137/72 mmHg - - 88 17 -  07/17/13 1130 133/72 mmHg - - 86 16 -  07/17/13 1115 125/71 mmHg - - 83 19 -  07/17/13 1100 126/60 mmHg - - 85 18 -  07/17/13 1045 122/73 mmHg - - 86 19 -  07/17/13 1030 116/69 mmHg - - 84 16 -  07/17/13 1010 101/59  mmHg 96.9 F (36.1 C) Axillary 98 18 100 %    12:42- initial potassium elevated, reported to not have hemolysis, will repeat. Creatinine, pending at this time. 12:52- Creat. normal  2:40 PM Reevaluation with update and discussion. After initial assessment and treatment, an updated evaluation reveals he is comfortable. He is thirsty. He is having hiccups and requests, Thorazine, which he is currently taking. Findings discussed with patient, all questions answered.Daleen Bo L    2:44 PM-Consult complete with Dr. Forde Dandy. Patient case explained and discussed. He agrees to admit patient for further evaluation and treatment. Call ended at 14:48  CRITICAL CARE Performed by: Richarda Blade Total critical care time: 45 minutes Critical care time was exclusive of separately billable procedures and treating other patients. Critical care was necessary to treat or prevent imminent or life-threatening deterioration. Critical care was time spent personally by me on the following activities: development of treatment plan with patient and/or surrogate as well as nursing, discussions with consultants, evaluation of patient's response to treatment, examination of patient, obtaining history from patient or surrogate, ordering and performing treatments and interventions, ordering and review of laboratory studies, ordering and review of radiographic studies, pulse oximetry and  re-evaluation of patient's condition.  Labs Review Labs Reviewed  CBC WITH DIFFERENTIAL - Abnormal; Notable for the following:    WBC 10.7 (*)    RBC 3.77 (*)    Hemoglobin 10.8 (*)    HCT 32.7 (*)    Neutrophils Relative % 80 (*)    Neutro Abs 8.6 (*)    All other components within normal limits  BASIC METABOLIC PANEL - Abnormal; Notable for the following:    Sodium 123 (*)    Potassium 6.8 (*)    Chloride 86 (*)    Glucose, Bld 125 (*)    BUN 72 (*)    Calcium 10.7 (*)    GFR calc non Af Amer 51 (*)    GFR calc Af Amer 60 (*)    All other components within normal limits  URINALYSIS, ROUTINE W REFLEX MICROSCOPIC - Abnormal; Notable for the following:    APPearance TURBID (*)    Hgb urine dipstick MODERATE (*)    Protein, ur 100 (*)    Leukocytes, UA LARGE (*)    All other components within normal limits  URINE MICROSCOPIC-ADD ON - Abnormal; Notable for the following:    Bacteria, UA MANY (*)    All other components within normal limits  POTASSIUM - Abnormal; Notable for the following:    Potassium 6.4 (*)    All other components within normal limits  I-STAT CG4 LACTIC ACID, ED - Abnormal; Notable for the following:    Lactic Acid, Venous 3.62 (*)    All other components within normal limits  URINE CULTURE  CULTURE, BLOOD (ROUTINE X 2)  CULTURE, BLOOD (ROUTINE X 2)    Imaging Review Dg Chest Portable 1 View  07/17/2013   CLINICAL DATA:  Loss of consciousness.  Hypertension.  EXAM: PORTABLE CHEST - 1 VIEW  COMPARISON:  11/19/2012.  FINDINGS: Cardiac silhouette is normal in size. Aorta is mildly uncoiled no mediastinal or hilar masses. Lungs are clear. No pleural effusion. No pneumothorax.  Bony thorax is demineralized but grossly intact.  IMPRESSION: No active disease.   Electronically Signed   By: Lajean Manes M.D.   On: 07/17/2013 12:28     EKG Interpretation   Date/Time:  Tuesday July 17 2013 10:31:00 EDT Ventricular Rate:  84 PR Interval:  224 QRS Duration:  114 QT Interval:  367 QTC Calculation: 434 R Axis:   -103 Text Interpretation:  Sinus rhythm Prolonged PR interval Incomplete RBBB  and LAFB Low voltage, precordial leads Consider anterior infarct since  last good quality tracing, new Prolonged PR and incomplete RBBB and LAFB  Confirmed by Eulis Foster  MD, Vira Agar [id number]) on 07/17/2013 1:14:51 PM      MDM   Final diagnoses:  Vasovagal syncope  Hyperkalemia  Hyponatremia  Hypochloremia  Hiccups    Syncope, with apparent dehydration. He was metabolic abnormalities, consistent with dehydration, complicated by hyperkaylemia. He has azotemia, without elevation of his creatinine. Doubt sepsis, serious bacterial infection, impending vascular collapse.  Nursing Notes Reviewed/ Care Coordinated Applicable Imaging Reviewed Interpretation of Laboratory Data incorporated into ED treatment   Plan: Admit, to PCP. Service, medical telemetry bed    Richarda Blade, MD 07/17/13 [phone]

## 2013-07-18 DIAGNOSIS — R066 Hiccough: Secondary | ICD-10-CM

## 2013-07-18 DIAGNOSIS — I10 Essential (primary) hypertension: Secondary | ICD-10-CM

## 2013-07-18 DIAGNOSIS — E860 Dehydration: Secondary | ICD-10-CM

## 2013-07-18 LAB — BASIC METABOLIC PANEL
BUN: 59 mg/dL — ABNORMAL HIGH (ref 6–23)
CALCIUM: 9.9 mg/dL (ref 8.4–10.5)
CO2: 20 mEq/L (ref 19–32)
Chloride: 94 mEq/L — ABNORMAL LOW (ref 96–112)
Creatinine, Ser: 1.05 mg/dL (ref 0.50–1.35)
GFR calc non Af Amer: 68 mL/min — ABNORMAL LOW (ref >90)
GFR, EST AFRICAN AMERICAN: 79 mL/min — AB (ref >90)
Glucose, Bld: 171 mg/dL — ABNORMAL HIGH (ref 70–99)
Potassium: 5.3 mEq/L (ref 3.7–5.3)
SODIUM: 130 meq/L — AB (ref 137–147)

## 2013-07-18 LAB — URINE CULTURE: Colony Count: >=100000

## 2013-07-18 LAB — CORTISOL: Cortisol, Plasma: 24 ug/dL

## 2013-07-18 MED ORDER — CIPROFLOXACIN IN D5W 400 MG/200ML IV SOLN
400.0000 mg | Freq: Two times a day (BID) | INTRAVENOUS | Status: DC
  Administered 2013-07-18: 400 mg via INTRAVENOUS
  Filled 2013-07-18 (×2): qty 200

## 2013-07-18 MED ORDER — CHLORPROMAZINE HCL 50 MG PO TABS
50.0000 mg | ORAL_TABLET | Freq: Three times a day (TID) | ORAL | Status: DC | PRN
  Administered 2013-07-18 – 2013-07-22 (×8): 50 mg via ORAL
  Filled 2013-07-18 (×6): qty 1

## 2013-07-18 MED ORDER — ENSURE COMPLETE PO LIQD
237.0000 mL | Freq: Three times a day (TID) | ORAL | Status: DC
  Administered 2013-07-18 – 2013-07-26 (×22): 237 mL via ORAL

## 2013-07-18 MED ORDER — MAGNESIUM CITRATE PO SOLN
1.0000 | Freq: Two times a day (BID) | ORAL | Status: DC | PRN
  Administered 2013-07-18 – 2013-07-20 (×3): 1 via ORAL

## 2013-07-18 MED ORDER — POLYETHYLENE GLYCOL 3350 17 G PO PACK
17.0000 g | PACK | Freq: Every day | ORAL | Status: DC
  Administered 2013-07-18 – 2013-07-19 (×2): 17 g via ORAL
  Filled 2013-07-18 (×3): qty 1

## 2013-07-18 MED ORDER — DILTIAZEM HCL 60 MG PO TABS
60.0000 mg | ORAL_TABLET | Freq: Three times a day (TID) | ORAL | Status: DC
  Administered 2013-07-18 – 2013-07-19 (×4): 60 mg via ORAL
  Filled 2013-07-18 (×7): qty 1

## 2013-07-18 NOTE — Progress Notes (Signed)
INITIAL NUTRITION ASSESSMENT  DOCUMENTATION CODES Per approved criteria  -Severe malnutrition in the context of chronic illness  Pt meets criteria for severe MALNUTRITION in the context of chronic illness as evidenced by 9.3% body weight loss in 3 months, PO intake <75% for > one month.   INTERVENTION: -Recommend Ensure Complete po TID, each supplement provides 350 kcal and 13 grams of protein -Provided pt with nutrition educations regarding high protein/kcal foods -RD to provide pt with nutrition supplement coupons -Will continue to monitor  NUTRITION DIAGNOSIS: Inadequate oral intake related to decreased appetite as evidenced by decreased PO intake, 9.3% body weight loss in 3 months.   Goal: Pt to meet >/= 90% of their estimated nutrition needs    Monitor:  Total protein/energy intake, labs, weights  Reason for Assessment: MST  73 y.o. male  Admitting Dx: Syncope and collapse  ASSESSMENT: This is a 10 white male with history of recent surgery for urothelial carcinoma and a prolonged difficult postoperative period. He has had a stay both at [facility] and at the nursing home and back to [facility] and now has been home for couple of weeks. His oral intake of liquids has been good but has not been eating as many solids. He's had some significant constipation has been resistant to medications  -Pt reported decreased appetite since surgery in early 06/2013 -Has been tolerating small amounts of soft foods, and drinks Ensure Complete three times daily. Consuming water, orange juice and coffee for fluids at home. Will order to continue to supplement regimen and provide supplement coupons -Endorsed unintentional wt loss, usual weight around 160-170 lbs. Provided pt with nutrition education handouts from the Academy of [employer]etics:  "Increasing Calories and Protein Nutrition Therapy" and "High Protein, High Calorie Recipes"  -Appetite improving during admit, pt noted to be a "picky" eater  but understood importance of adequate kcal/proteins. Appreciative of handout -Pt also w/constipation, which is likely also contributing to sub-optimal intake. Receiving Miralax  Height: Ht Readings from Last 1 Encounters:  07/17/13 6\' 1"  (1.854 m)    Weight: Wt Readings from Last 1 Encounters:  07/17/13 145 lb 15.1 oz (66.2 kg)    Ideal Body Weight: 184 lbs  % Ideal Body Weight: 79%  Wt Readings from Last 10 Encounters:  07/17/13 145 lb 15.1 oz (66.2 kg)  04/20/13 160 lb (72.576 kg)  08/31/12 192 lb 3.2 oz (87.181 kg)  07/04/12 194 lb (87.998 kg)  05/19/12 194 lb 12.8 oz (88.361 kg)    Usual Body Weight: 160-170  % Usual Body Weight: 91%  BMI:  Body mass index is 19.26 kg/(m^2).  Estimated Nutritional Needs: Kcal: 2000-2200 Protein: 100-110 gram Fluid: >/=2000 ml/daily  Skin: WDL  Diet Order: General  EDUCATION NEEDS: -Education needs addressed   Intake/Output Summary (Last 24 hours) at 07/18/13 1204 Last data filed at 07/18/13 1030  Gross per 24 hour  Intake 3112.92 ml  Output   1000 ml  Net 2112.92 ml    Last BM: 6/22   Labs:   Recent Labs Lab 07/17/13 1106 07/17/13 1341 07/18/13 0823  NA 123* 124* 130*  K 6.8* 6.4*  6.4* 5.3  CL 86* 90* 94*  CO2 20 20 20   BUN 72* 69* 59*  CREATININE 1.32 1.28 1.05  CALCIUM 10.7* 9.9 9.9  GLUCOSE 125* 100* 171*    CBG (last 3)  No results found for this basename: GLUCAP,  in the last 72 hours  Scheduled Meds: . aspirin  162 mg Oral  q morning - 10a  . ciprofloxacin  400 mg Intravenous Q12H  . diltiazem  60 mg Oral 3 times per day  . enoxaparin  40 mg Subcutaneous q morning - 10a  . escitalopram  10 mg Oral q morning - 10a  . flecainide  50 mg Oral BID  . mirtazapine  7.5 mg Oral QHS  . pantoprazole  40 mg Oral Daily  . polyethylene glycol  17 g Oral Daily    Continuous Infusions: . sodium chloride 125 mL/hr at 07/18/13 0415    Past Medical History  Diagnosis Date  . Bladder cancer   .  Hypertension   . GERD (gastroesophageal reflux disease)   . Renal cell carcinoma 2008    left  . BPH (benign prostatic hyperplasia)   . A-fib     with ventricular response  . Diverticulosis   . Colon polyps   . Hyperlipidemia     pt denies    Past Surgical History  Procedure Laterality Date  . Bladder surgery  2008    transurethral resection/resection of prostatic urethra  . Nephrectomy  2007    partial, left  . Nephrostomy  2011    stent  . Laparoscopic surgery  2012    laser   (?)  . US echocardiography  0000000    mild diastolic dysfunction,mild dilated LA,mild MR,AI,mildly dilated aortic root  . Nm myocar perf wall motion  09/08/2010    Normal  . Robot assisted laparoscopic complete cystect ileal conduit    . Appendectomy    . Bowel resection      Atlee Abide MS RD LDN Clinical Dietitian [id number]

## 2013-07-18 NOTE — Progress Notes (Signed)
Subjective: Had a good night. Some minor sweats. No fever. Rhythm stable on monitor. Weak but no pain. Still constipated. No CP or SOB  Objective: Vital signs in last 24 hours: Temp:  [96.9 F (36.1 C)-98.3 F (36.8 C)] 98 F (36.7 C) (06/24 0653) Pulse Rate:  [79-115] 86 (06/24 0653) Resp:  [15-24] 20 (06/24 0653) BP: (91-139)/(58-74) 131/72 mmHg (06/24 0653) SpO2:  [100 %] 100 % (06/24 0653) Weight:  [66.2 kg (145 lb 15.1 oz)] 66.2 kg (145 lb 15.1 oz) (06/23 1615)  Intake/Output from previous day: 06/23 0701 - 06/24 0700 In: 1672.9 [I.V.:1472.9; IV Piggyback:200] Out: 1000 [Urine:1000] Intake/Output this shift: Total I/O In: 1472.9 [I.V.:1472.9] Out: 600 [Urine:600]  Thin and a bit frail but lying flat without dyspnea. Oral membranes moist. Lungs clear ht regular abd soft NT, urostomy. extrems good pulses no edema. Awake, mentating well  Lab Results   Recent Labs  07/17/13 1106  WBC 10.7*  RBC 3.77*  HGB 10.8*  HCT 32.7*  MCV 86.7  MCH 28.6  RDW 14.9  PLT 165    Recent Labs  07/17/13 1106 07/17/13 1341  NA 123* 124*  K 6.8* 6.4*  6.4*  CL 86* 90*  CO2 20 20  GLUCOSE 125* 100*  BUN 72* 69*  CREATININE 1.32 1.28  CALCIUM 10.7* 9.9    Studies/Results: Ct Head W Wo Contrast  07/17/2013   CLINICAL DATA:  Syncope.  Urothelial carcinoma.  EXAM: CT HEAD WITHOUT AND WITH CONTRAST  TECHNIQUE: Contiguous axial images were obtained from the base of the skull through the vertex without and with intravenous contrast  CONTRAST:  119mL OMNIPAQUE IOHEXOL 300 MG/ML  SOLN  COMPARISON:  None.  FINDINGS: There is no evidence of acute cortical infarct, intracranial hemorrhage, mass, midline shift, or extra-axial fluid collection. There is mild generalized cerebral atrophy. 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 Postcontrast images are mildly degraded by motion. No abnormal enhancement is identified.  Orbits are unremarkable.  Mastoid air cells are clear. There is mild bilateral ethmoid air cell mucosal thickening.  IMPRESSION: No evidence of acute intracranial abnormality or mass/metastatic disease.   Electronically Signed   By: Logan Bores   On: 07/17/2013 20:31   Dg Chest Portable 1 View  07/17/2013   CLINICAL DATA:  Loss of consciousness.  Hypertension.  EXAM: PORTABLE CHEST - 1 VIEW  COMPARISON:  11/19/2012.  FINDINGS: Cardiac silhouette is normal in size. Aorta is mildly uncoiled no mediastinal or hilar masses. Lungs are clear. No pleural effusion. No pneumothorax.  Bony thorax is demineralized but grossly intact.  IMPRESSION: No active disease.   Electronically Signed   By: Lajean Manes M.D.   On: 07/17/2013 12:28    Scheduled Meds: . aspirin  162 mg Oral q morning - 10a  . ciprofloxacin  400 mg Intravenous Q12H  . enoxaparin  40 mg Subcutaneous q morning - 10a  . escitalopram  10 mg Oral q morning - 10a  . flecainide  50 mg Oral BID  . methylPREDNISolone (SOLU-MEDROL) injection  80 mg Intravenous 4 times per day  . mirtazapine  7.5 mg Oral QHS  . pantoprazole  40 mg Oral Daily   Continuous Infusions: . sodium chloride 125 mL/hr at 07/18/13 0415   PRN Meds:chlorproMAZINE  Assessment/Plan: SYNCOPE: CT head OK. No rhythm issues. Orthostatics to be done. HYPERKALEMIA/HYPONATREMIA; repeat labs pending DEHYDRATION: getting fluids, repeat labs pending ANEMIA: repeat labs pending AFIB: in SR, cards help appreciated  BLADDER CA : CT shows no intracranial spread HYPERTENSION: OK this AM CONSTIPATION: add cathartics   LOS: 1 day   Jordan Oconnell,Jordan Oconnell 07/18/2013, 6:59 AM

## 2013-07-18 NOTE — Care Management Note (Addendum)
    Page 1 of 2   07/22/2013     4:56:16 PM CARE MANAGEMENT NOTE 07/22/2013  Patient:  Jordan Oconnell, Jordan Oconnell   Account Number:  [id number]  Date Initiated:  07/18/2013  Documentation initiated by:  Dessa Phi  Subjective/Objective Assessment:   73 Y/O M ADMITTED W/SYNCOPE.FZ:[id number] CA,ILEAL CONDUIT.     Action/Plan:   FROM HOME W/[place].HAS PCP,HAS CANE,RW,3N1.USED [facility] IN PAST.   Anticipated DC Date:  07/22/2013   Anticipated DC Plan:  [place]  CM consult      Choice offered to / List presented to:  C-1 Patient        [place] arranged  [place]   Status of service:  Completed, signed off Medicare Important Message given?  YES (If response is "NO", the following Medicare IM given date fields will be blank) Date Medicare IM given:  07/20/2013 Date Additional Medicare IM given:    Discharge Disposition:  [place]  Per UR Regulation:  Reviewed for med. necessity/level of care/duration of stay  If discussed at [place] of Stay Meetings, dates discussed:    Comments:  07/22/13 09:10 CM spoke with pt to confirm [facility] as his choice for HHPT/SLP.  Pt confirms.  CM notified [facility] rep, Gerhard Perches of impending dishcarge today.  No other CM needs were communicated.  Mariane Masters, BSN, [place].  07/20/13 KATHY MAHABIR RN,BSN NCM [phone] AWAIT FINAL HHPT ORDER.[facility] ALREADY FOLLOWING.NOTED PALLIATIVE NOTE-RECOMMENDING GRIEF COUNSELING BY SW.SW NOTIFIED.  07/19/13 KATHY MAHABIR RN,BSN NCM [phone] PT-HH.SPOKE TO DTR IN LAW ON PHONE(PATIENT AGREED) ABOUT D/C PLANS.HER CONCERNS WERE OF HOSPICE SERVICES-I EXPLAINED THAT UNTIL THE DOCTOR HAD THAT DISCUSSION WITH PATIENT I COULDN'T HAVE THAT CONVERSATION WITH THE PATIENT.SHE VOICED UNDERSTANDING.INFORMED HER OF AWAITING PT CONS FOR RECOMMENDATIONS,& [facility] WAS CHOSEN BY PATIENT FOR HHC.NURSE UPDATED.  07/18/13  KATHY MAHABIR RN,BSN NCM [phone] RECOMMEND PT CONS.PATIENT CHOSE [facility] IF HHPT NEEDED, REP DEBBIE CONTACTED TO FOLLOW.

## 2013-07-18 NOTE — Progress Notes (Signed)
Patient ID: Jordan Oconnell, male   DOB: 01-12-1941, 73 y.o.   MRN: [id number] Labs are back Results for Jordan Oconnell, Jordan Oconnell (MRN [id number]) as of 07/18/2013 09:22  Ref. Range 07/17/2013 19:06 07/17/2013 20:18 07/18/2013 08:23  Sodium Latest Range: 137-147 mEq/L   130 (L)  Potassium Latest Range: 3.7-5.3 mEq/L   5.3  Chloride Latest Range: 96-112 mEq/L   94 (L)  CO2 Latest Range: 19-32 mEq/L   20  BUN Latest Range: 6-23 mg/dL   59 (H)  Creatinine Latest Range: 0.50-1.35 mg/dL   1.05  Calcium Latest Range: 8.4-10.5 mg/dL   9.9  GFR calc non Af Amer Latest Range: >90 mL/min   68 (L)  GFR calc Af Amer Latest Range: >90 mL/min   79 (L)  Glucose Latest Range: 70-99 mg/dL   171 (H)  Cortisol, Plasma No range found 24.0     Making progress. Cortisol is fine, stop Rx GLUCOSE is noted to be up  [place]

## 2013-07-18 NOTE — Progress Notes (Signed)
    Subjective:  Overall stable night.  No F/C.  Weak & constipated.   Hiccups  Objective:  Vital Signs in the last 24 hours: Temp:  [96.9 F (36.1 C)-98.3 F (36.8 C)] 98 F (36.7 C) (06/24 0653) Pulse Rate:  [79-115] 86 (06/24 0653) Resp:  [15-24] 20 (06/24 0653) BP: (91-139)/(58-74) 131/72 mmHg (06/24 0653) SpO2:  [100 %] 100 % (06/24 0653) Weight:  [145 lb 15.1 oz (66.2 kg)] 145 lb 15.1 oz (66.2 kg) (06/23 1615)  Intake/Output from previous day: 06/23 0701 - 06/24 0700 In: 2872.9 [I.V.:2472.9; IV Piggyback:400] Out: 1000 [Urine:1000] Intake/Output from this shift:    Physical Exam: General appearance: alert, cooperative and no distress Neck: no adenopathy, no carotid bruit and no JVD Lungs: clear to auscultation bilaterally and normal percussion bilaterally Heart: regular rate and rhythm, S1, S2 normal, no murmur, click, rub or gallop and normal apical impulse Abdomen: soft, non-tender; bowel sounds normal; no masses,  no organomegaly Extremities: extremities normal, atraumatic, no cyanosis or edema Pulses: 2+ and symmetric Neurologic: Grossly normal  Lab Results:  Recent Labs  07/17/13 1106  WBC 10.7*  HGB 10.8*  PLT 165    Recent Labs  07/17/13 1106 07/17/13 1341  NA 123* 124*  K 6.8* 6.4*  6.4*  CL 86* 90*  CO2 20 20  GLUCOSE 125* 100*  BUN 72* 69*  CREATININE 1.32 1.28   No results found for this basename: TROPONINI, CK, MB,  in the last 72 hours Hepatic Function Panel No results found for this basename: PROT, ALBUMIN, AST, ALT, ALKPHOS, BILITOT, BILIDIR, IBILI,  in the last 72 hours No results found for this basename: CHOL,  in the last 72 hours No results found for this basename: PROTIME,  in the last 72 hours  Imaging:   CT Head - OK  Cardiac Studies:  No new EKG today; Tele shows NSR  Assessment/Plan:  Principal Problem:   Syncope and collapse Active Problems:   HTN (hypertension)   Atrial fibrillation   Hyperkalemia  Hyponatremia   Bladder cancer   Dehydration  Remains in NSR.  Afib is an unlikely cause of syncope regardless. Some concern with HyperKalemia & Flecainide.  Watch for potential ventricular arrythmias. -- was on diltiazem @ home - needs AVN agent with flecainide.  Will restart @ low dose (BP is OK). (just not lon acting)  Treat underlying illness - if no arrhythmias on tele - consider OP 30 d Monitor.  Will follow.    LOS: 1 day    Jordan Oconnell,Jordan Oconnell 07/18/2013, 8:26 AM

## 2013-07-19 DIAGNOSIS — E43 Unspecified severe protein-calorie malnutrition: Secondary | ICD-10-CM | POA: Diagnosis present

## 2013-07-19 LAB — COMPREHENSIVE METABOLIC PANEL
ALT: 9 U/L (ref 0–53)
AST: 11 U/L (ref 0–37)
Albumin: 2.1 g/dL — ABNORMAL LOW (ref 3.5–5.2)
Alkaline Phosphatase: 44 U/L (ref 39–117)
BUN: 66 mg/dL — ABNORMAL HIGH (ref 6–23)
CO2: 20 mEq/L (ref 19–32)
Calcium: 8.5 mg/dL (ref 8.4–10.5)
Chloride: 99 mEq/L (ref 96–112)
Creatinine, Ser: 0.96 mg/dL (ref 0.50–1.35)
GFR calc Af Amer: >90 mL/min (ref >90)
GFR calc non Af Amer: 80 mL/min — ABNORMAL LOW (ref >90)
Glucose, Bld: 116 mg/dL — ABNORMAL HIGH (ref 70–99)
Potassium: 5.2 mEq/L (ref 3.7–5.3)
Sodium: 129 mEq/L — ABNORMAL LOW (ref 137–147)
Total Bilirubin: <0.2 mg/dL — ABNORMAL LOW (ref 0.3–1.2)
Total Protein: 4.4 g/dL — ABNORMAL LOW (ref 6.0–8.3)

## 2013-07-19 MED ORDER — DILTIAZEM HCL ER COATED BEADS 180 MG PO CP24
180.0000 mg | ORAL_CAPSULE | Freq: Every day | ORAL | Status: DC
  Administered 2013-07-19 – 2013-07-27 (×9): 180 mg via ORAL
  Filled 2013-07-19 (×10): qty 1

## 2013-07-19 MED ORDER — MENTHOL 3 MG MT LOZG
1.0000 | LOZENGE | OROMUCOSAL | Status: DC | PRN
  Administered 2013-07-19: 3 mg via ORAL
  Filled 2013-07-19: qty 9

## 2013-07-19 MED ORDER — OXYCODONE-ACETAMINOPHEN 5-325 MG PO TABS
1.0000 | ORAL_TABLET | Freq: Four times a day (QID) | ORAL | Status: DC | PRN
  Administered 2013-07-19 – 2013-07-26 (×8): 2 via ORAL
  Filled 2013-07-19 (×8): qty 2

## 2013-07-19 MED ORDER — DOCUSATE SODIUM 100 MG PO CAPS
100.0000 mg | ORAL_CAPSULE | Freq: Two times a day (BID) | ORAL | Status: DC
  Administered 2013-07-19 – 2013-07-27 (×16): 100 mg via ORAL
  Filled 2013-07-19 (×18): qty 1

## 2013-07-19 MED ORDER — POTASSIUM CHLORIDE CRYS ER 20 MEQ PO TBCR
20.0000 meq | EXTENDED_RELEASE_TABLET | Freq: Two times a day (BID) | ORAL | Status: DC
  Administered 2013-07-19 – 2013-07-22 (×7): 20 meq via ORAL
  Filled 2013-07-19 (×8): qty 1

## 2013-07-19 MED ORDER — CIPROFLOXACIN HCL 500 MG PO TABS
500.0000 mg | ORAL_TABLET | Freq: Two times a day (BID) | ORAL | Status: DC
  Administered 2013-07-19 – 2013-07-22 (×7): 500 mg via ORAL
  Filled 2013-07-19 (×9): qty 1

## 2013-07-19 NOTE — Plan of Care (Signed)
Problem: Phase III Progression Outcomes Goal: Voiding independently Outcome: Not Applicable Date Met:  07/19/13 Has Ileal conduit     

## 2013-07-19 NOTE — Progress Notes (Signed)
Subjective: No dizziness  No SOB Objective: Filed Vitals:   07/18/13 0653 07/18/13 1434 07/18/13 2102 07/19/13 0623  BP: 131/72 137/68 139/62 113/63  Pulse: 86 101 98 79  Temp: 98 F (36.7 C) 97.7 F (36.5 C) 98 F (36.7 C) 97.6 F (36.4 C)  TempSrc:  Oral Oral Oral  Resp: 20 20 20 18   Height:      Weight:      SpO2: 100% 100% 99% 99%   Weight change:   Intake/Output Summary (Last 24 hours) at 07/19/13 0708 Last data filed at 07/19/13 [id number]  Gross per 24 hour  Intake   4265 ml  Output   3650 ml  Net    615 ml    General: Alert, awake, oriented x3, in no acute distress Neck:  JVP is normal Heart: Regular rate and rhythm, without murmurs, rubs, gallops.  Lungs: Clear to auscultation.  No rales or wheezes. Exemities:  No edema.   Neuro: Grossly intact, nonfocal.  Tele:  SR  Few beats with aberrancy,  Lab Results: Results for orders placed during the hospital encounter of 07/17/13 (from the past 24 hour(s))  BASIC METABOLIC PANEL     Status: Abnormal   Collection Time    07/18/13  8:23 AM      Result Value Ref Range   Sodium 130 (*) 137 - 147 mEq/L   Potassium 5.3  3.7 - 5.3 mEq/L   Chloride 94 (*) 96 - 112 mEq/L   CO2 20  19 - 32 mEq/L   Glucose, Bld 171 (*) 70 - 99 mg/dL   BUN 59 (*) 6 - 23 mg/dL   Creatinine, Ser 1.05  0.50 - 1.35 mg/dL   Calcium 9.9  8.4 - 10.5 mg/dL   GFR calc non Af Amer 68 (*) >90 mL/min   GFR calc Af Amer 79 (*) >90 mL/min  COMPREHENSIVE METABOLIC PANEL     Status: Abnormal   Collection Time    07/19/13  4:13 AM      Result Value Ref Range   Sodium 129 (*) 137 - 147 mEq/L   Potassium 5.2  3.7 - 5.3 mEq/L   Chloride 99  96 - 112 mEq/L   CO2 20  19 - 32 mEq/L   Glucose, Bld 116 (*) 70 - 99 mg/dL   BUN 66 (*) 6 - 23 mg/dL   Creatinine, Ser 0.96  0.50 - 1.35 mg/dL   Calcium 8.5  8.4 - 10.5 mg/dL   Total Protein 4.4 (*) 6.0 - 8.3 g/dL   Albumin 2.1 (*) 3.5 - 5.2 g/dL   AST 11  0 - 37 U/L   ALT 9  0 - 53 U/L   Alkaline Phosphatase 44   39 - 117 U/L   Total Bilirubin <0.2 (*) 0.3 - 1.2 mg/dL   GFR calc non Af Amer 80 (*) >90 mL/min   GFR calc Af Amer >90  >90 mL/min    Studies/Results: No results found.  Medications: Reviewed   @PROBHOSP @  1/  Syncope  Remains asymptomatic.  No evid of arrhythmia.  Will make sure has f/u as outpatient  Encouraged hydration.    2.  Mild AS  3.  PAF  Remains in SR  Would continue flecandie and dilt  Switch to long acting   4.  Electrolyte abnormalities  Improved  Encouraged hydration  LOS: 2 days   Dorris Carnes 07/19/2013, 7:08 AM

## 2013-07-19 NOTE — Evaluation (Signed)
Physical Therapy Evaluation Patient Details Name: Jordan Oconnell MRN: [id number] DOB: [birth year] Today's Date: 07/19/2013   History of Present Illness  73 yo male admitted with syncope, collapse. Hx of bladder/kidney cancer-surgery, HTN, Afib. Recent d/c from [facility] 07/12/13  Clinical Impression  On eval, pt required supervision level assist for mobility-able to ambulate ~400 feet with walker. Dyspnea and fatigue with activity noted. Encouraged continued ambulation with nursing supervision during hospital stay.     Follow Up Recommendations Home health PT    Equipment Recommendations  None recommended by PT    Recommendations for Other Services OT consult     Precautions / Restrictions Precautions Precautions: Fall Restrictions Weight Bearing Restrictions: No      Mobility  Bed Mobility Overal bed mobility: Modified Independent                Transfers Overall transfer level: Needs assistance   Transfers: Sit to/from Stand Sit to Stand: Supervision            Ambulation/Gait Ambulation/Gait assistance: Supervision Ambulation Distance (Feet): 400 Feet Assistive device: Rolling walker (2 wheeled) Gait Pattern/deviations: Step-through pattern;Decreased stride length     General Gait Details: dyspnea 2/4. fatigues fairly easily.   Stairs            Wheelchair Mobility    Modified Rankin (Stroke Patients Only)       Balance                                             Pertinent Vitals/Pain No c/o pain    Home Living Family/patient expects to be discharged to:: Private residence Living Arrangements: Other relatives   Type of Home: House Home Access: Stairs to enter   Technical brewer of Steps: 1-2 [place]: One level;Other (Comment);Able to live on main level with bedroom/bathroom (loft) Home Equipment: Kasandra Knudsen - single point;Walker - 2 wheels;Bedside commode      Prior Function Level of Independence:  Independent with assistive device(s)         Comments: uses cane     Hand Dominance        Extremity/Trunk Assessment   Upper Extremity Assessment: Overall WFL for tasks assessed           Lower Extremity Assessment: Generalized weakness      Cervical / Trunk Assessment: Normal  Communication   Communication: No difficulties  Cognition Arousal/Alertness: Awake/alert Behavior During Therapy: WFL for tasks assessed/performed Overall Cognitive Status: Within Functional Limits for tasks assessed                      General Comments      Exercises General Exercises - Lower Extremity Hip ABduction/ADduction: AROM;10 reps;Standing Hip Flexion/Marching: AROM;10 reps;Standing Heel Raises: AROM;10 reps;Standing Mini-Sqauts: AROM;10 reps;Standing Other Exercises Other Exercises: sit tio stand transition without UE support x 5      Assessment/Plan    PT Assessment Patient needs continued PT services  PT Diagnosis Difficulty walking;Generalized weakness   PT Problem List Decreased activity tolerance;Decreased balance;Decreased mobility;Decreased strength  PT Treatment Interventions DME instruction;Gait training;Functional mobility training;Therapeutic activities;Therapeutic exercise;Patient/family education;Balance training   PT Goals (Current goals can be found in the Care Plan section) Acute Rehab PT Goals Patient Stated Goal: regain strength, stamina PT Goal Formulation: With patient Time For Goal Achievement: 07/26/13 Potential to Achieve Goals: Good    Frequency  Min 3X/week   Barriers to discharge        Co-evaluation               End of Session Equipment Utilized During Treatment: Gait belt Activity Tolerance: Patient limited by fatigue Patient left: in bed;with call bell/phone within reach           Time: 1135-1154 PT Time Calculation (min): 19 min   Charges:   PT Evaluation $Initial PT Evaluation Tier I: 1 Procedure PT  Treatments $Gait Training: 8-22 mins   PT G Codes:          Weston Anna, MPT Pager: [phone]

## 2013-07-19 NOTE — Progress Notes (Addendum)
Subjective: Still no BM. Took mag Citrate this AM. Still plagued by hiccoughs. Weak but no pain. Some PVC's on monitor. Walked a little yesterday  Objective: Vital signs in last 24 hours: Temp:  [97.6 F (36.4 C)-98 F (36.7 C)] 97.6 F (36.4 C) (06/25 0623) Pulse Rate:  [79-101] 79 (06/25 0623) Resp:  [18-20] 18 (06/25 0623) BP: (113-139)/(62-68) 113/63 mmHg (06/25 0623) SpO2:  [99 %-100 %] 99 % (06/25 0623)  Intake/Output from previous day: 06/24 0701 - 06/25 0700 In: 4265 [P.O.:1140; I.V.:2925; IV Piggyback:200] Out: [id number] [Urine:3650] Intake/Output this shift:    Frail but non toxic. Anicteric, moist oral membranes. Lungs clear Ht regular abd soft NT, urostomy. Awake, mentating well, pulses good no edema  Lab Results   Recent Labs  07/17/13 1106  WBC 10.7*  RBC 3.77*  HGB 10.8*  HCT 32.7*  MCV 86.7  MCH 28.6  RDW 14.9  PLT 165    Recent Labs  07/18/13 0823 07/19/13 0413  NA 130* 129*  K 5.3 5.2  CL 94* 99  CO2 20 20  GLUCOSE 171* 116*  BUN 59* 66*  CREATININE 1.05 0.96  CALCIUM 9.9 8.5   URINE, CLEAN CATCH Special Requests NONE Culture Setup Time 07/17/2013 14:23 Performed at [facility] >=100,000 COLONIES/ML Performed at [facility] Multiple bacterial morphotypes present, none predominant. Suggest appropriate recollection if clinically indicated. Performed at [facility] Report Status 07/18/2013 FINAL  Studies/Results: Ct Head W Wo Contrast  07/17/2013   CLINICAL DATA:  Syncope.  Urothelial carcinoma.  EXAM: CT HEAD WITHOUT AND WITH CONTRAST  TECHNIQUE: Contiguous axial images were obtained from the base of the skull through the vertex without and with intravenous contrast  CONTRAST:  132mL OMNIPAQUE IOHEXOL 300 MG/ML  SOLN  COMPARISON:  None.  FINDINGS: There is no evidence of acute cortical infarct, intracranial hemorrhage, mass, midline shift, or extra-axial fluid collection. There is mild generalized  cerebral atrophy. 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 Postcontrast images are mildly degraded by motion. No abnormal enhancement is identified.  Orbits are unremarkable. Mastoid air cells are clear. There is mild bilateral ethmoid air cell mucosal thickening.  IMPRESSION: No evidence of acute intracranial abnormality or mass/metastatic disease.   Electronically Signed   By: Logan Bores   On: 07/17/2013 20:31   Dg Chest Portable 1 View  07/17/2013   CLINICAL DATA:  Loss of consciousness.  Hypertension.  EXAM: PORTABLE CHEST - 1 VIEW  COMPARISON:  11/19/2012.  FINDINGS: Cardiac silhouette is normal in size. Aorta is mildly uncoiled no mediastinal or hilar masses. Lungs are clear. No pleural effusion. No pneumothorax.  Bony thorax is demineralized but grossly intact.  IMPRESSION: No active disease.   Electronically Signed   By: Lajean Manes M.D.   On: 07/17/2013 12:28    Scheduled Meds: . aspirin  162 mg Oral q morning - 10a  . ciprofloxacin  400 mg Intravenous Q12H  . diltiazem  180 mg Oral Daily  . enoxaparin  40 mg Subcutaneous q morning - 10a  . escitalopram  10 mg Oral q morning - 10a  . feeding supplement (ENSURE COMPLETE)  237 mL Oral TID WC  . flecainide  50 mg Oral BID  . mirtazapine  7.5 mg Oral QHS  . pantoprazole  40 mg Oral Daily  . polyethylene glycol  17 g Oral Daily   Continuous Infusions: . sodium chloride 125 mL/hr at 07/19/13 0630  PRN Meds:chlorproMAZINE, magnesium citrate  Assessment/Plan:   SYNCOPE: CT head OK. No rhythm issues of note. Has been up some without issues.  HYPERKALEMIA/HYPONATREMIA; K problems resolved. Na a bit low at 129 DEHYDRATION: improved ANEMIA: no new data AFIB: in SR, cards help appreciated , still on Rx BLADDER CA : CT shows no intracranial spread  HYPERTENSION: OK this AM  CONSTIPATION: no progress yet  UTI: mixed flora, change to PO cipro   LOS: 2 days   Jordan Oconnell  ALAN 07/19/2013, 7:34 AM

## 2013-07-20 ENCOUNTER — Inpatient Hospital Stay (HOSPITAL_COMMUNITY): Payer: Medicare Other

## 2013-07-20 DIAGNOSIS — E875 Hyperkalemia: Secondary | ICD-10-CM

## 2013-07-20 DIAGNOSIS — E43 Unspecified severe protein-calorie malnutrition: Secondary | ICD-10-CM

## 2013-07-20 LAB — CBC
HCT: 29.5 % — ABNORMAL LOW (ref 39.0–52.0)
HEMOGLOBIN: 9.3 g/dL — AB (ref 13.0–17.0)
MCH: 28.9 pg (ref 26.0–34.0)
MCHC: 31.5 g/dL (ref 30.0–36.0)
MCV: 91.6 fL (ref 78.0–100.0)
Platelets: 192 10*3/uL (ref 150–400)
RBC: 3.22 MIL/uL — AB (ref 4.22–5.81)
RDW: 15.8 % — ABNORMAL HIGH (ref 11.5–15.5)
WBC: 9.4 10*3/uL (ref 4.0–10.5)

## 2013-07-20 LAB — BASIC METABOLIC PANEL
BUN: 55 mg/dL — ABNORMAL HIGH (ref 6–23)
CHLORIDE: 102 meq/L (ref 96–112)
CO2: 23 meq/L (ref 19–32)
Calcium: 8.6 mg/dL (ref 8.4–10.5)
Creatinine, Ser: 0.99 mg/dL (ref 0.50–1.35)
GFR calc Af Amer: >90 mL/min (ref >90)
GFR calc non Af Amer: 79 mL/min — ABNORMAL LOW (ref >90)
Glucose, Bld: 135 mg/dL — ABNORMAL HIGH (ref 70–99)
Potassium: 4.4 mEq/L (ref 3.7–5.3)
SODIUM: 134 meq/L — AB (ref 137–147)

## 2013-07-20 LAB — OCCULT BLOOD X 1 CARD TO LAB, STOOL: FECAL OCCULT BLD: POSITIVE — AB

## 2013-07-20 MED ORDER — ALUM & MAG HYDROXIDE-SIMETH 200-200-20 MG/5ML PO SUSP
30.0000 mL | Freq: Three times a day (TID) | ORAL | Status: DC
Start: 2013-07-20 — End: 2013-07-27
  Administered 2013-07-20 – 2013-07-27 (×17): 30 mL via ORAL
  Filled 2013-07-20 (×18): qty 30

## 2013-07-20 MED ORDER — MAGNESIUM CITRATE PO SOLN: 1.0000 | Freq: Once | ORAL | Status: DC

## 2013-07-20 MED ORDER — POTASSIUM CHLORIDE IN NACL 20-0.9 MEQ/L-% IV SOLN
INTRAVENOUS | Status: DC
  Administered 2013-07-20 – 2013-07-27 (×13): via INTRAVENOUS
  Filled 2013-07-20 (×24): qty 1000

## 2013-07-20 MED ORDER — POLYETHYLENE GLYCOL 3350 17 G PO PACK
17.0000 g | PACK | Freq: Four times a day (QID) | ORAL | Status: DC
  Administered 2013-07-20 (×2): 17 g via ORAL
  Filled 2013-07-20 (×8): qty 1

## 2013-07-20 NOTE — Progress Notes (Signed)
    Subjective:  Main complaint is constipation No further syncope, nausea etc.  Objective:  Vital Signs in the last 24 hours: Temp:  [97.4 F (36.3 C)-97.8 F (36.6 C)] 97.4 F (36.3 C) (06/26 0658) Pulse Rate:  [80-91] 80 (06/26 0658) Resp:  [16-18] 16 (06/26 0658) BP: (107-130)/(58-65) 130/64 mmHg (06/26 0658) SpO2:  [97 %-100 %] 100 % (06/26 0658)  Intake/Output from previous day: 06/25 0701 - 06/26 0700 In: 2536.3 [P.O.:1080; I.V.:1456.3] Out: 2600 [Urine:2600] Intake/Output from this shift:    Physical Exam: General appearance: alert, cooperative and no distress Neck: no adenopathy, no carotid bruit and no JVD Lungs: clear to auscultation bilaterally and normal percussion bilaterally Heart: regular rate and rhythm, S1, S2 normal, no murmur, click, rub or gallop and normal apical impulse Abdomen: soft, non-tender; bowel sounds normal; no masses,  no organomegaly Extremities: extremities normal, atraumatic, no cyanosis or edema Pulses: 2+ and symmetric Neurologic: Grossly normal  Lab Results:  Recent Labs  07/17/13 1106  WBC 10.7*  HGB 10.8*  PLT 165    Recent Labs  07/18/13 0823 07/19/13 0413  NA 130* 129*  K 5.3 5.2  CL 94* 99  CO2 20 20  GLUCOSE 171* 116*  BUN 59* 66*  CREATININE 1.05 0.96   No results found for this basename: TROPONINI, CK, MB,  in the last 72 hours Hepatic Function Panel  Recent Labs  07/19/13 0413  PROT 4.4*  ALBUMIN 2.1*  AST 11  ALT 9  ALKPHOS 44  BILITOT <0.2*   No results found for this basename: CHOL,  in the last 72 hours No results found for this basename: PROTIME,  in the last 72 hours  Imaging:   No new imaging.  Cardiac Studies:  No new EKG today; Tele shows NSR  Assessment/Plan:  Principal Problem:   Syncope and collapse Active Problems:   HTN (hypertension)   Atrial fibrillation   Hyperkalemia   Hyponatremia   Bladder cancer   Dehydration   Protein-calorie malnutrition, severe   No  further Sx. electrolytes have improved.  Remains in NSR.  Afib is an unlikely cause of syncope regardless. Some concern with HyperKalemia & Flecainide.  Watch for potential ventricular arrythmias. -- was on diltiazem @ home - needs AVN agent with flecainide.  Back on PO Diltiazem  @ home dose.  Treat underlying illness - if no arrhythmias on tele - consider OP 30 d Monitor.  Will follow.    LOS: 3 days    HARDING,DAVID W 07/20/2013, 7:01 AM

## 2013-07-20 NOTE — Progress Notes (Signed)
NUTRITION FOLLOW UP/CONSULT  Intervention:   -Continue to recommend Ensure Complete po TID, each supplement provides 350 kcal and 13 grams of protein -Reviewed nutrition therapy to assist with constipation -Encouraged appetite, intake of high kcal/protein foods -Will continue to monitor   Nutrition Dx:   Inadequate oral intake related to decreased appetite as evidenced by decreased PO intake, 9.3% body weight loss in 3 months- progressing   Goal:   Pt to meet >/= 90% of their estimated nutrition needs-progessing   Monitor:   Total protein/energy intake, labs, weights, GI profile   Assessment:   6/24: -Pt reported decreased appetite since surgery in early 06/2013  -Has been tolerating small amounts of soft foods, and drinks Ensure Complete three times daily. Consuming water, orange juice and coffee for fluids at home. Will order to continue to supplement regimen and provide supplement coupons  -Endorsed unintentional wt loss, usual weight around 160-170 lbs. Provided pt with nutrition education handouts from the [employer]: "Increasing Calories and Protein Nutrition Therapy" and "High Protein, High Calorie Recipes"  -Appetite improving during admit, pt noted to be a "picky" eater but understood importance of adequate kcal/proteins. Appreciative of handout  -Pt also w/constipation, which is likely also contributing to sub-optimal intake. Receiving Miralax    6/25: -Received consult for further evaluation of nutritional status -Provided pt with nutritional supplement coupons for d/c needs -Pt reported fair appetite, has been eating 50-100% of meals. Drinking approximately 2 Ensure/day. Has also been consuming food from outside source brought in from family (milkshakes) -Continues with constipation. Reviewed intake of high fiber foods, fluids, and physical activity to assist with motility. Also receiving docusate and mag citrate -No additional questions at this  time, recommended to continue with nutrition supplement 2-3 times/daily upon d/c for nutrient replenishment   Height: Ht Readings from Last 1 Encounters:  07/17/13 6\' 1"  (1.854 m)    Weight Status:   Wt Readings from Last 1 Encounters:  07/17/13 145 lb 15.1 oz (66.2 kg)    Re-estimated needs:  Kcal: 2000-2200  Protein: 100-110 gram  Fluid: >/=2000 ml/daily  Skin: WDL  Diet Order: General   Intake/Output Summary (Last 24 hours) at 07/20/13 0925 Last data filed at 07/20/13 0658  Gross per 24 hour  Intake 2176.25 ml  Output   2600 ml  Net -423.75 ml    Last BM: 6/22   Labs:   Recent Labs Lab 07/18/13 0823 07/19/13 0413 07/20/13 0808  NA 130* 129* 134*  K 5.3 5.2 4.4  CL 94* 99 102  CO2 20 20 23   BUN 59* 66* 55*  CREATININE 1.05 0.96 0.99  CALCIUM 9.9 8.5 8.6  GLUCOSE 171* 116* 135*    CBG (last 3)  No results found for this basename: GLUCAP,  in the last 72 hours  Scheduled Meds: . alum & mag hydroxide-simeth  30 mL Oral TID  . aspirin  162 mg Oral q morning - 10a  . ciprofloxacin  500 mg Oral BID  . diltiazem  180 mg Oral Daily  . docusate sodium  100 mg Oral BID  . enoxaparin  40 mg Subcutaneous q morning - 10a  . escitalopram  10 mg Oral q morning - 10a  . feeding supplement (ENSURE COMPLETE)  237 mL Oral TID WC  . flecainide  50 mg Oral BID  . magnesium citrate  1 Bottle Oral Once  . mirtazapine  7.5 mg Oral QHS  . pantoprazole  40 mg Oral Daily  .  polyethylene glycol  17 g Oral QID  . potassium chloride  20 mEq Oral BID    Continuous Infusions: . 0.9 % NaCl with KCl 20 mEq / L 125 mL/hr at 07/20/13 [place] LDN Clinical Dietitian [id number]

## 2013-07-20 NOTE — Progress Notes (Signed)
Patient [id number] Jordan DILLOW      DOB: May 13, 1940      [id number]  Consult received for grief counseling after the loss of a daughter.  The Palliative Medicine Team social work position remains unfilled at this time will make a referral to hospital social worker for assessment and referal.  If we can assist with goals of care further please reconsult by calling [phone].  Will sign off at this time.  Melissa L. Lovena Le, MD MBA The Palliative Medicine Team at [facility] Phone: [phone] Pager: [phone]

## 2013-07-20 NOTE — Evaluation (Signed)
Occupational Therapy Evaluation Patient Details Name: Jordan Oconnell MRN: [id number] DOB: 1940-10-31 Today's Date: 07/20/2013    History of Present Illness 73 yo male admitted with syncope, collapse. Hx of bladder/kidney cancer-surgery, HTN, Afib. Recent d/c from [facility] 07/12/13   Clinical Impression   Pt doing well. Began education for energy conservation and safety with ADL. Will continue to follow while on acute for further education and progress ADL independence.     Follow Up Recommendations  No OT follow up    Equipment Recommendations  None recommended by OT    Recommendations for Other Services       Precautions / Restrictions Precautions Precautions: Fall Restrictions Weight Bearing Restrictions: No      Mobility Bed Mobility Overal bed mobility: Modified Independent                Transfers Overall transfer level: Needs assistance Equipment used: Rolling walker (2 wheeled) Transfers: Sit to/from Stand Sit to Stand: Supervision              Balance                                            ADL Overall ADL's : Needs assistance/impaired Eating/Feeding: Independent;Sitting   Grooming: Wash/dry hands;Set up;Sitting   Upper Body Bathing: Set up;Sitting   Lower Body Bathing: Min guard;Sit to/from stand   Upper Body Dressing : Set up;Sitting   Lower Body Dressing: Min guard;Sit to/from stand   Toilet Transfer: Supervision/safety;Ambulation;Comfort height toilet;Grab bars   Toileting- Clothing Manipulation and Hygiene: Min guard;Sit to/from stand         General ADL Comments: Discussed energy conservation techniques including not using really hot/warm water to bathe in the shower, ventilation with door open while showering, purse lip breathing and taking rest breaks PRN.      Vision                     Perception     Praxis      Pertinent Vitals/Pain 1-24 dyspnea with activity; encouraged PLB and taking  rest breaks.     Hand Dominance     Extremity/Trunk Assessment Upper Extremity Assessment Upper Extremity Assessment: Overall WFL for tasks assessed           Communication Communication Communication: No difficulties   Cognition Arousal/Alertness: Awake/alert Behavior During Therapy: WFL for tasks assessed/performed Overall Cognitive Status: Within Functional Limits for tasks assessed                     General Comments       Exercises       Shoulder Instructions      Home Living Family/patient expects to be discharged to:: Private residence Living Arrangements: Other relatives Available Help at Discharge: Family Type of Home: House Home Access: Stairs to enter [employer] of Steps: 1-2   Home Layout: One level;Other (Comment);Able to live on main level with bedroom/bathroom (loft)     Bathroom Shower/Tub: Occupational psychologist: Standard     Home Equipment: Cane - single point;Walker - 2 wheels;Bedside commode          Prior Functioning/Environment Level of Independence: Independent with assistive device(s)        Comments: uses cane    OT Diagnosis: Generalized weakness   OT Problem List: Decreased strength;Decreased knowledge of  use of DME or AE   OT Treatment/Interventions: Self-care/ADL training;Patient/family education;Therapeutic activities;DME and/or AE instruction    OT Goals(Current goals can be found in the care plan section) Acute Rehab OT Goals Patient Stated Goal: regain strength OT Goal Formulation: With patient Time For Goal Achievement: 08/03/13 Potential to Achieve Goals: Good  OT Frequency: Min 2X/week   Barriers to D/C:            Co-evaluation              End of Session Equipment Utilized During Treatment: Rolling walker  Activity Tolerance: Patient tolerated treatment well Patient left: in chair;with call bell/phone within reach   Time: 0900-0925 OT Time Calculation (min):  25 min Charges:  OT General Charges $OT Visit: 1 Procedure OT Evaluation $Initial OT Evaluation Tier I: 1 Procedure OT Treatments $Therapeutic Activity: 8-22 mins G-Codes:    Jules Schick [id number] 07/20/2013, 9:34 AM

## 2013-07-20 NOTE — Progress Notes (Signed)
Subjective: He has not had a bowel movement in at least a week, appetite is fair, no dyspnea or chest pain, weak with walking  Objective: Vital signs in last 24 hours: Temp:  [97.4 F (36.3 C)-97.8 F (36.6 C)] 97.4 F (36.3 C) (06/26 0658) Pulse Rate:  [80-91] 80 (06/26 0658) Resp:  [16-18] 16 (06/26 0658) BP: (107-130)/(58-65) 130/64 mmHg (06/26 0658) SpO2:  [97 %-100 %] 100 % (06/26 0658) Weight change:    Intake/Output from previous day: 06/25 0701 - 06/26 0700 In: 2536.3 [P.O.:1080; I.V.:1456.3] Out: 2600 [Urine:2600]   General appearance: alert, cooperative and no distress Resp: clear to auscultation bilaterally Cardio: regular rate and rhythm GI: soft, non-tender; bowel sounds normal; no masses,  no organomegaly and mild distension Extremities: extremities normal, atraumatic, no cyanosis or edema  Lab Results:  Recent Labs  07/17/13 1106  WBC 10.7*  HGB 10.8*  HCT 32.7*  PLT 165   BMET  Recent Labs  07/18/13 0823 07/19/13 0413  NA 130* 129*  K 5.3 5.2  CL 94* 99  CO2 20 20  GLUCOSE 171* 116*  BUN 59* 66*  CREATININE 1.05 0.96  CALCIUM 9.9 8.5   CMET CMP     Component Value Date/Time   NA 129* 07/19/2013 0413   K 5.2 07/19/2013 0413   CL 99 07/19/2013 0413   CO2 20 07/19/2013 0413   GLUCOSE 116* 07/19/2013 0413   BUN 66* 07/19/2013 0413   CREATININE 0.96 07/19/2013 0413   CALCIUM 8.5 07/19/2013 0413   PROT 4.4* 07/19/2013 0413   ALBUMIN 2.1* 07/19/2013 0413   AST 11 07/19/2013 0413   ALT 9 07/19/2013 0413   ALKPHOS 44 07/19/2013 0413   BILITOT <0.2* 07/19/2013 0413   GFRNONAA 80* 07/19/2013 0413   GFRAA >90 07/19/2013 0413    CBG (last 3)  No results found for this basename: GLUCAP,  in the last 72 hours  INR RESULTS:   Lab Results  Component Value Date   INR 1.00 09/04/2010     Studies/Results: No results found.  Medications: I have reviewed the patient's current medications.  Assessment/Plan: #1 Syncope: stable with no significant  arrhythmia on telemetry #2 Elevated BUN level: likely due to dehydration, less likely due to occult GI bleed. Will increase IVF and check stool hemoccult test. #3 Hiccups: chronic and will add a liquid antacid for this #4 Constipation: severe and will check abdomen X-rays, and adjust meds accordingly   LOS: 3 days   [name],[name] 07/20/2013, 7:47 AM

## 2013-07-21 LAB — BASIC METABOLIC PANEL
BUN: 43 mg/dL — AB (ref 6–23)
CHLORIDE: 105 meq/L (ref 96–112)
CO2: 24 mEq/L (ref 19–32)
CREATININE: 0.92 mg/dL (ref 0.50–1.35)
Calcium: 7.6 mg/dL — ABNORMAL LOW (ref 8.4–10.5)
GFR calc Af Amer: >90 mL/min (ref >90)
GFR calc non Af Amer: 81 mL/min — ABNORMAL LOW (ref >90)
Glucose, Bld: 111 mg/dL — ABNORMAL HIGH (ref 70–99)
Potassium: 5.2 mEq/L (ref 3.7–5.3)
Sodium: 136 mEq/L — ABNORMAL LOW (ref 137–147)

## 2013-07-21 LAB — GLUCOSE, CAPILLARY: Glucose-Capillary: 230 mg/dL — ABNORMAL HIGH (ref 70–99)

## 2013-07-21 MED ORDER — POLYETHYLENE GLYCOL 3350 17 G PO PACK
17.0000 g | PACK | Freq: Every day | ORAL | Status: DC
  Administered 2013-07-22 – 2013-07-27 (×5): 17 g via ORAL
  Filled 2013-07-21 (×6): qty 1

## 2013-07-21 MED ORDER — METOCLOPRAMIDE HCL 5 MG/5ML PO SOLN
5.0000 mg | Freq: Three times a day (TID) | ORAL | Status: DC
  Administered 2013-07-21 – 2013-07-24 (×8): 5 mg via ORAL
  Administered 2013-07-24: 09:00:00 via ORAL
  Administered 2013-07-25 – 2013-07-27 (×8): 5 mg via ORAL
  Filled 2013-07-21 (×21): qty 5

## 2013-07-21 NOTE — Progress Notes (Signed)
Subjective:  No recurrent syncope. Major complaint is hiccups. Not short of breath except when he is having hiccups. No chest pain  Objective:  Vital Signs in the last 24 hours: BP 135/61  Pulse 109  Temp(Src) 97.7 F (36.5 C) (Oral)  Resp 20  Ht 6\' 1"  (1.854 m)  Wt 66.2 kg (145 lb 15.1 oz)  BMI 19.26 kg/m2  SpO2 100%  Physical Exam: Pleasant male, polite who is having hiccups Lungs:  Clear  Cardiac:  Regular rhythm with irregular beats noted, normal S1 and S2, no S3 Extremities:  No edema present  Intake/Output from previous day: 06/26 0701 - 06/27 0700 In: 3776.3 [P.O.:1170; I.V.:2606.3] Out: 2178 [Urine:2175; Stool:3] Weight Filed Weights   07/17/13 1615  Weight: 66.2 kg (145 lb 15.1 oz)    Lab Results: Basic Metabolic Panel:  Recent Labs  07/20/13 0808 07/21/13 0715  NA 134* 136*  K 4.4 5.2  CL 102 105  CO2 23 24  GLUCOSE 135* 111*  BUN 55* 43*  CREATININE 0.99 0.92    CBC:  Recent Labs  07/20/13 0808  WBC 9.4  HGB 9.3*  HCT 29.5*  MCV 91.6  PLT 192    Telemetry: Appears in sinus with first degree AV block with PVCs noted  Assessment/Plan:  1. History of syncope with no recurrent arrhythmias 2. Atrial fibrillation 3. Treatment with flecainide and diltiazem  Recommendations:  Continue current antiarrhythmics. No further cardiology recommendations other than continued telemetry while he is in the hospital.     W. Doristine Church  MD [facility]  07/21/2013, 8:26 AM

## 2013-07-21 NOTE — Progress Notes (Signed)
Subjective: Having worsening of hiccups making it difficult to swallow or eat, constipation has resolved  Objective: Vital signs in last 24 hours: Temp:  [97.7 F (36.5 C)-97.9 F (36.6 C)] 97.7 F (36.5 C) (06/26 2055) Pulse Rate:  [100-109] 109 (06/26 2055) Resp:  [20] 20 (06/26 2055) BP: (124-135)/(61) 135/61 mmHg (06/26 2055) SpO2:  [100 %] 100 % (06/26 2055) Weight change:    Intake/Output from previous day: 06/26 0701 - 06/27 0700 In: 3776.3 [P.O.:1170; I.V.:2606.3] Out: 2178 [Urine:2175; Stool:3]   General appearance: alert, cooperative, mild distress and with frequent hiccups and regurgitation of ingested liquids Resp: clear to auscultation bilaterally Cardio: regular rate and rhythm GI: bilateral 1+ leg edema Extremities: extremities normal, atraumatic, no cyanosis or edema  Lab Results:  Recent Labs  07/20/13 0808  WBC 9.4  HGB 9.3*  HCT 29.5*  PLT 192   BMET  Recent Labs  07/20/13 0808 07/21/13 0715  NA 134* 136*  K 4.4 5.2  CL 102 105  CO2 23 24  GLUCOSE 135* 111*  BUN 55* 43*  CREATININE 0.99 0.92  CALCIUM 8.6 7.6*   CMET CMP     Component Value Date/Time   NA 136* 07/21/2013 0715   K 5.2 07/21/2013 0715   CL 105 07/21/2013 0715   CO2 24 07/21/2013 0715   GLUCOSE 111* 07/21/2013 0715   BUN 43* 07/21/2013 0715   CREATININE 0.92 07/21/2013 0715   CALCIUM 7.6* 07/21/2013 0715   PROT 4.4* 07/19/2013 0413   ALBUMIN 2.1* 07/19/2013 0413   AST 11 07/19/2013 0413   ALT 9 07/19/2013 0413   ALKPHOS 44 07/19/2013 0413   BILITOT <0.2* 07/19/2013 0413   GFRNONAA 81* 07/21/2013 0715   GFRAA >90 07/21/2013 0715    CBG (last 3)  No results found for this basename: GLUCAP,  in the last 72 hours  INR RESULTS:   Lab Results  Component Value Date   INR 1.00 09/04/2010     Studies/Results: Dg Abd 1 View  07/20/2013   CLINICAL DATA:  Constipation for 1 week  EXAM: ABDOMEN - 1 VIEW  COMPARISON:  CT abdomen 03/26/2013  FINDINGS: Moderate amount of stool  throughout the colon. There is no bowel dilatation to suggest obstruction. There is no evidence of pneumoperitoneum, portal venous gas or pneumatosis. There are no pathologic calcifications along the expected course of the ureters. Left ureteral stent is present. Multiple surgical clips along the pelvic side wall from prior cystectomy and prostatectomy. The osseous structures are unremarkable.  IMPRESSION: 1. Moderate amount of stool throughout the colon.   Electronically Signed   By: Kathreen Devoid   On: 07/20/2013 09:52    Medications: I have reviewed the patient's current medications.  Assessment/Plan: #1 Syncope: resolved and likely due to dehydration from dysphagia related to persistent hiccups. #2 Constipation: resolved #3 Hiccups: worsening and interfering with ability to maintain adequate nutrition intake and hydration, so will try to obtain a GI consult for this, may need EGD. Will also add reglan ac tid.    LOS: 4 days   [name],[name] 07/21/2013, 12:29 PM

## 2013-07-22 DIAGNOSIS — D649 Anemia, unspecified: Secondary | ICD-10-CM | POA: Diagnosis present

## 2013-07-22 DIAGNOSIS — R066 Hiccough: Secondary | ICD-10-CM | POA: Diagnosis present

## 2013-07-22 LAB — HEMOGLOBIN AND HEMATOCRIT, BLOOD
HCT: 24.5 % — ABNORMAL LOW (ref 39.0–52.0)
HEMOGLOBIN: 7.8 g/dL — AB (ref 13.0–17.0)

## 2013-07-22 LAB — BASIC METABOLIC PANEL
BUN: 34 mg/dL — ABNORMAL HIGH (ref 6–23)
CALCIUM: 8.2 mg/dL — AB (ref 8.4–10.5)
CO2: 23 meq/L (ref 19–32)
Chloride: 100 mEq/L (ref 96–112)
Creatinine, Ser: 1.08 mg/dL (ref 0.50–1.35)
GFR calc Af Amer: 76 mL/min — ABNORMAL LOW (ref >90)
GFR calc non Af Amer: 66 mL/min — ABNORMAL LOW (ref >90)
GLUCOSE: 134 mg/dL — AB (ref 70–99)
Potassium: 4.9 mEq/L (ref 3.7–5.3)
SODIUM: 134 meq/L — AB (ref 137–147)

## 2013-07-22 LAB — IRON AND TIBC
Iron: <10 ug/dL — ABNORMAL LOW (ref 42–135)
UIBC: 135 ug/dL (ref 125–400)

## 2013-07-22 MED ORDER — POLYETHYLENE GLYCOL 3350 17 G PO PACK: 17.0000 g | PACK | Freq: Two times a day (BID) | ORAL | Status: DC | PRN

## 2013-07-22 MED ORDER — METOCLOPRAMIDE HCL 5 MG/5ML PO SOLN: 5.0000 mg | Freq: Three times a day (TID) | ORAL | Status: DC

## 2013-07-22 MED ORDER — NYSTATIN 100000 UNIT/ML MT SUSP
5.0000 mL | Freq: Four times a day (QID) | OROMUCOSAL | Status: DC
  Administered 2013-07-22 – 2013-07-27 (×19): 500000 [IU] via ORAL
  Filled 2013-07-22 (×23): qty 5

## 2013-07-22 MED ORDER — ENSURE COMPLETE PO LIQD
237.0000 mL | Freq: Three times a day (TID) | ORAL | Status: DC
Start: 2013-07-22 — End: 2014-05-13

## 2013-07-22 NOTE — Progress Notes (Signed)
Patient has a walker at home. Patient does not need another walker. Thanks nursing. Lenise Herald

## 2013-07-22 NOTE — Progress Notes (Signed)
Lab came to draw blood from patient this morning and was unsuccessful. A second attempt was made to draw a BMET and the patient asked what the lab was for and I explained it was to show his electrolytes. He then asked what he latest hemoglbin was and I let him know it had not been drawn for two days. Patient demanded that his henglobin be drawn so I paged Dr. Philip Aspen who is on call for Dr. Reynaldo Minium and asked if it was okay to add on and he stated yes. Patient lab came back and MD was paged. Hemoglobin had dropped to 7.8. Patient is now refusing to go home until he is 100%. Let MD know and will follow up.

## 2013-07-22 NOTE — Progress Notes (Signed)
Met with Pt to provide grief counseling information.  Pt stated that he is currently receiving counseling through [facility].  He stated that, once he is d/c'd from the hospital, he intends to resume counseling through this company.  Pt's son called while CSW was in the room.  Son requesting that CSW call him.  Spoke with Pt's son via phone.  Pt's son stated that Pt is currently linked with [facility] and that he hopes his father will resume counseling there upon d/c.  Pt's son asking if a medical provider can discuss with Pt the reasons why his body isn't recovering as quickly as Pt would have hoped. CSW encouraged Pt's son to contact Pt's doctor to discuss his concerns.  In the meantime, CSW to notify RN to contact Pt's son to discuss Pt's son's concerns.  Pt's son happy with this arrangement.  CSW thanked Pt's son for his time.  RN aware that son is requesting a call.  No further CSW needs identified.   Bernita Raisin, [facility] Social Work [phone]

## 2013-07-22 NOTE — Progress Notes (Signed)
Subjective:  Hiccups resolved overnight. In in bed resting quietly now. No shortness of breath or chest pain.  Objective:  Vital Signs in the last 24 hours: BP 104/52  Pulse 104  Temp(Src) 99.7 F (37.6 C) (Oral)  Resp 21  Ht 6\' 1"  (1.854 m)  Wt 66.2 kg (145 lb 15.1 oz)  BMI 19.26 kg/m2  SpO2 94%  Physical Exam: Pleasant male in no acute distress  Lungs:  Clear  Cardiac:  Regular rhythm with irregular beats noted, normal S1 and S2, no S3 Extremities:  No edema present  Intake/Output from previous day: 06/27 0701 - 06/28 0700 In: 2340 [P.O.:840; I.V.:1500] Out: 6375 [Urine:6375] Weight Filed Weights   07/17/13 1615  Weight: 66.2 kg (145 lb 15.1 oz)    Lab Results: Basic Metabolic Panel:  Recent Labs  07/20/13 0808 07/21/13 0715  NA 134* 136*  K 4.4 5.2  CL 102 105  CO2 23 24  GLUCOSE 135* 111*  BUN 55* 43*  CREATININE 0.99 0.92    CBC:  Recent Labs  07/20/13 0808  WBC 9.4  HGB 9.3*  HCT 29.5*  MCV 91.6  PLT 192    Telemetry: Appears in sinus with first degree AV block   Assessment/Plan:  1. History of syncope with no recurrent arrhythmias 2. Atrial fibrillation 3. Treatment with flecainide and diltiazem  Recommendations:  Continue telemetry. No additional cardiac workup needed at this time.     Kerry Hough  MD [facility]  07/22/2013, 8:22 AM

## 2013-07-22 NOTE — Progress Notes (Signed)
Patient will be staying one more day to have repeat labs drawn in the am per MD.

## 2013-07-22 NOTE — Progress Notes (Signed)
Today the patient is having more difficulty speaking. This could be from recurrent thrush or a side effect of reglan. We will restart nystatin swish and swallow today, and consider stopping reglan if speech continues to be a problem.

## 2013-07-22 NOTE — Discharge Summary (Addendum)
Physician Discharge Summary  Patient ID: Jordan Oconnell MRN: [id number] DOB/AGE: [birth year] 74 y.o.  Admit date: 07/17/2013 Discharge date: 07/27/2013   Discharge Diagnoses:  Principal Problem:   Syncope and collapse Active Problems:   Bladder cancer   Dehydration   Protein-calorie malnutrition, severe   Anemia   Pneumonia, organism unspecified   Hemoptysis, unspecified   Pneumonia   Acute esophagitis   HTN (hypertension)   Atrial fibrillation   Hyperkalemia   Hyponatremia   Intractable hiccups   Discharged Condition: good  Hospital Course: This is a 73 white male with history of recent surgery for urothelial carcinoma and a prolonged difficult postoperative period. He has had a stay both at [facility] and at the nursing home and back to [facility] and now has been home for couple of weeks. His oral intake of liquids has been good but has not been eating as many solids. He's had some significant constipation has been resistant to medications. This morning, he got up and was walking and suddenly became weak and was helped to the floor by his son. He had what appeared to be a staring episode for 20 seconds or so but had no incontinence or jerking movements. He had no prodrome of dizziness pain or other symptoms. His son reported his heart was very fast right after this episode. He had no vomiting. he's had no fevers or chills or sweats.  At the time of admission he felt fairly well. He noted no visual change or headache. He had no pain anywhere and he had been breathing well. He notes no chest pain. He does feel somewhat hungry. He has no numbness or focal weakness. He's not had syncope in the past. He does have a history of paroxysmal atrial fibrillation and is maintained on diltiazem and flecainide chronically. The last cardiac note I can find is from 2013. An echocardiogram from 2012 showed an preserved ejection fraction and a mildly dilated left atrium. Overall he was making progress up until  this episode.  He has had a recurrence of the hiccups which he has been fighting with.  He was admitted to a telemetry bed for further evaluation.  He was seen by consultants from cardiology as well as palliative care (regarding the death of his daughter). Initial laboratory studies were significant for a serum sodium of 124 with potassium 6.4 and BUN 69 with creatinine 1.28, serum lactate level of 3.62 (0.5-2.2), and serum albumin 2.1. He had a random serum cortisol level that was normal, indicating no evidence of adrenal insufficiency. With IV fluids his electrolytes rapidly improved as well as his estimated renal function. Telemetry showed no evidence of a serious arrhythmia and he tended to stay in a sinus rhythm. He was troubled by persistent hiccups that interfered with his ability to maintain hydration and nutrition status, and this improved temporarily with the addition of reglan to his regimen. He was given a transfusion of PRBCs for iron deficiency anemia with hemoglobin in the 6 range. On the day of discharge he is to have an EGD for evaluation of anemia and persistent hiccups with hemoccult positive stool that showed moderate esophagitis. He also had a CT scan of the abdomen and pelvis that showed bibasilar airspace disease (right more than left) felt to be due to infection versus hemorrhage. He was also seen by a pulmonary medicine consultant who agree with starting broad spectrum antibiotics and did not recommend bronchoscopy. He had a PICC line placed for administration of IV antibiotics. He  had no complications from his hospitalization. He was also followed closely by physical therapy and occupational therapy personnel who noted that he had physical deconditioning, and that he was able to ambulate with a walker. On the day of discharge he felt fine with no dyspnea, fever, or hiccups, and his appetite had improved. Procedure during his hospitalization included:  CT head, CT abd/pelvis, EGD,  transfusion 2 units PRBC, PICC line placement. Consultant included: Glenetta Hew (card), Scarlette Shorts (GI), Jennet Maduro (pulm). There were no complications associated with his hospitalization.   Consults: cardiology and Palliative care medicine and GI Scarlette Shorts), cards Ula Lingo), pulm Michaelene Song Nelda Marseille).  Significant Diagnostic Studies:  Dg Chest 2 View  07/26/2013   CLINICAL DATA:  Pneumonia  EXAM: CHEST  2 VIEW  COMPARISON:  Chest radiograph July 17, 2013; CT abdomen and pelvis which included lung bases July 24, 2013  FINDINGS: There is patchy infiltrate in both lung bases with significant partial clearing on the right and a lesser degree of partial clearing on the left compared to recent prior study. Elsewhere lungs are clear. Heart size and pulmonary vascularity are within normal limits. No adenopathy. There is degenerative change in the thoracic spine.  IMPRESSION: There is consolidation in both lung bases, more on the right than on the left. There is dense some clearing compared to recent prior study, although there still remains moderate infiltrate, particularly in the right lower lobe.   Electronically Signed   By: Lowella Grip M.D.   On: 07/26/2013 14:20    Labs: Lab Results  Component Value Date   WBC 9.7 07/26/2013   HGB 8.8* 07/26/2013   HCT 26.8* 07/26/2013   MCV 86.5 07/26/2013   PLT 228 07/26/2013      Recent Labs Lab 07/26/13 0440  NA 138  K 4.4  CL 102  CO2 27  BUN 24*  CREATININE 0.92  CALCIUM 8.2*  GLUCOSE 93       Lab Results  Component Value Date   INR 1.00 09/04/2010     Recent Results (from the past 240 hour(s))  CULTURE, BLOOD (ROUTINE X 2)     Status: None   Collection Time    07/17/13 11:43 AM      Result Value Ref Range Status   Specimen Description BLOOD LEFT FOREARM   Final   Special Requests BOTTLES DRAWN AEROBIC ONLY 1 CC   Final   Culture  Setup Time     Final   Value: 07/17/2013 14:57     Performed at [facility]   Culture      Final   Value: NO GROWTH 5 DAYS     Performed at [facility]   Report Status 07/23/2013 FINAL   Final  URINE CULTURE     Status: None   Collection Time    07/17/13 11:46 AM      Result Value Ref Range Status   Specimen Description URINE, CLEAN CATCH   Final   Special Requests NONE   Final   Culture  Setup Time     Final   Value: 07/17/2013 14:23     Performed at [place]     Final   Value: >=100,000 COLONIES/ML     Performed at [facility]   Culture     Final   Value: Multiple bacterial morphotypes present, none predominant. Suggest appropriate recollection if clinically indicated.     Performed at [facility]  Report Status 07/18/2013 FINAL   Final  CULTURE, BLOOD (ROUTINE X 2)     Status: None   Collection Time    07/17/13 12:37 PM      Result Value Ref Range Status   Specimen Description BLOOD   Final   Special Requests     Final   Value: BOTTLES DRAWN AEROBIC AND ANAEROBIC RIGHT FOOT 3.5 CC   Culture  Setup Time     Final   Value: 07/18/2013 07:52     Performed at [facility]   Culture     Final   Value: NO GROWTH 5 DAYS     Performed at [facility]   Report Status 07/24/2013 FINAL   Final    Serum iron was less than 10, before transfusion hemoglobin was 6.2 with Hct 19.4, WBC 10.2, platelets 181, BUN 27 with creatinine 0.97  Hosptial EKGs showed a normal sinus rhythm with 1st degree AV block, LAFB, with delayed precordial R wave progression  Discharge Exam: Blood pressure 129/74, pulse 98, temperature 97.7 F (36.5 C), temperature source Oral, resp. rate 18, height 6\' 1"  (1.854 m), weight 66.2 kg (145 lb 15.1 oz), SpO2 98.00%.  Physical Exam: In general, the patient is an elderly white man who was comfortable lying partially upright in bed. HEENT exam was within normal limits, neck was supple without jugular venous distention or carotid bruit, chest had crackles lower 1/2 right hemithorax, heart  had a regular rate and rhythm without significant murmur or gallop, abdomen had normal bowel sounds no tenderness, he had bilateral trace leg edema. He was alert and well oriented with normal affect and could move all extremities well.  Disposition:  He will be discharged from the hospital to go to a skilled nursing home to complete IV antibiotic treatment and rehabilitation for extreme physical deconditioning with gait instability and severe protein calorie malnutrition.       Discharge Instructions   Call MD for:    Complete by:  As directed   Call for fever, chills, difficulty breathing, diarrhea or constipation, or any other concerning symptoms     Call MD for:    Complete by:  As directed   Call for fever, chills, dizziness, vomiting, fatigue, chest pain, or any other concerning symptoms.     Diet - low sodium heart healthy    Complete by:  As directed      Diet - low sodium heart healthy    Complete by:  As directed      Discharge instructions    Complete by:  As directed   Try to improve nutrition status by having frequent small meals, check body weights every day and call if  Weight increases or decreases by 3 pounds from todays weight, use a walker to safely get around     Discharge instructions    Complete by:  As directed   Use your walker at all times to prevent falls, take copy of hospital discharge summary to your planned postop visit at [facility].     Increase activity slowly    Complete by:  As directed      Increase activity slowly    Complete by:  As directed             Medication List    STOP taking these medications       aspirin 81 MG tablet     enoxaparin 40 MG/0.4ML injection  Commonly known as:  LOVENOX  TAKE these medications       0.9 % NaCl with KCl 20 mEq / L 20-0.9 MEQ/L-%  Inject 20 mL/hr into the vein continuous.     ALPRAZolam 0.25 MG tablet  Commonly known as:  XANAX  Take 0.25 mg by mouth 3 (three) times daily as  needed for anxiety.     chlorproMAZINE 25 MG tablet  Commonly known as:  THORAZINE  Take 25 mg by mouth 3 (three) times daily as needed for hiccoughs.     diltiazem 180 MG 24 hr capsule  Commonly known as:  TIAZAC  Take 180 mg by mouth every morning.     escitalopram 10 MG tablet  Commonly known as:  LEXAPRO  Take 1 tablet by mouth every morning.     feeding supplement (ENSURE COMPLETE) Liqd  Take 237 mLs by mouth 3 (three) times daily with meals.     ferrous fumarate 325 (106 FE) MG Tabs tablet  Commonly known as:  HEMOCYTE - 106 mg FE  Take 1 tablet (106 mg of iron total) by mouth 2 (two) times daily.     flecainide 50 MG tablet  Commonly known as:  TAMBOCOR  Take 50 mg by mouth 2 (two) times daily.     metoCLOPramide 5 MG/5ML solution  Commonly known as:  REGLAN  Take 5 mLs (5 mg total) by mouth every morning.     mirtazapine 7.5 MG tablet  Commonly known as:  REMERON  Take 7.5 mg by mouth at bedtime.     nystatin 100000 UNIT/ML suspension  Commonly known as:  MYCOSTATIN  Take 5 mLs (500,000 Units total) by mouth 4 (four) times daily.     omeprazole 20 MG capsule  Commonly known as:  PRILOSEC  Take 2 capsules (40 mg total) by mouth daily.     piperacillin-tazobactam 3.375 GM/50ML IVPB  Commonly known as:  ZOSYN  Inject 50 mLs (3.375 g total) into the vein every 8 (eight) hours.     polyethylene glycol packet  Commonly known as:  MIRALAX / GLYCOLAX  Take 17 g by mouth 2 (two) times daily as needed for mild constipation.     senna 8.6 MG Tabs tablet  Commonly known as:  SENOKOT  Take 2 tablets by mouth 2 (two) times daily.     traMADol 50 MG tablet  Commonly known as:  ULTRAM  Take 50 mg by mouth every 6 (six) hours as needed for moderate pain.     Vancomycin 750 MG/150ML Soln  Commonly known as:  VANCOCIN  Inject 150 mLs (750 mg total) into the vein every 12 (twelve) hours.     vitamin C 500 MG tablet  Commonly known as:  ASCORBIC ACID  Take 1 tablet  (500 mg total) by mouth 2 (two) times daily.       Follow-up Information   Follow up with ARONSON,RICHARD A, MD. Schedule an appointment as soon as possible for a visit in 1 week.   Specialty:  Internal Medicine   Contact information:   [facility] [place] [address] [phone]       Follow up with [facility] (home health physical therapy and speech therapy)    Contact information:   [address] [place] [address] [phone]          Specialty:    Contact information:          Signed: Donnajean Lopes 07/26/2013, 5:37 PM

## 2013-07-23 DIAGNOSIS — R195 Other fecal abnormalities: Secondary | ICD-10-CM

## 2013-07-23 DIAGNOSIS — D500 Iron deficiency anemia secondary to blood loss (chronic): Secondary | ICD-10-CM

## 2013-07-23 LAB — BASIC METABOLIC PANEL
BUN: 33 mg/dL — ABNORMAL HIGH (ref 6–23)
CO2: 21 meq/L (ref 19–32)
Calcium: 8 mg/dL — ABNORMAL LOW (ref 8.4–10.5)
Chloride: 102 mEq/L (ref 96–112)
Creatinine, Ser: 1.06 mg/dL (ref 0.50–1.35)
GFR calc Af Amer: 78 mL/min — ABNORMAL LOW (ref >90)
GFR calc non Af Amer: 67 mL/min — ABNORMAL LOW (ref >90)
GLUCOSE: 118 mg/dL — AB (ref 70–99)
POTASSIUM: 4.6 meq/L (ref 3.7–5.3)
Sodium: 136 mEq/L — ABNORMAL LOW (ref 137–147)

## 2013-07-23 LAB — CBC
HCT: 25 % — ABNORMAL LOW (ref 39.0–52.0)
HEMOGLOBIN: 8 g/dL — AB (ref 13.0–17.0)
MCH: 28.5 pg (ref 26.0–34.0)
MCHC: 32 g/dL (ref 30.0–36.0)
MCV: 89 fL (ref 78.0–100.0)
Platelets: UNDETERMINED 10*3/uL (ref 150–400)
RBC: 2.81 MIL/uL — AB (ref 4.22–5.81)
RDW: 16.4 % — ABNORMAL HIGH (ref 11.5–15.5)
WBC: 22 10*3/uL — ABNORMAL HIGH (ref 4.0–10.5)

## 2013-07-23 LAB — CULTURE, BLOOD (ROUTINE X 2): Culture: NO GROWTH

## 2013-07-23 NOTE — Progress Notes (Signed)
Clinical Social Work Department CLINICAL SOCIAL WORK PLACEMENT NOTE 07/23/2013  Patient:  Jordan Oconnell, Jordan Oconnell  Account Number:  [id number] Admit date:  07/17/2013  Clinical Social Worker:  Renold Genta  Date/time:  07/23/2013 03:17 PM  Clinical Social Work is seeking post-discharge placement for this patient at the following level of care:   SKILLED NURSING   (*CSW will update this form in Epic as items are completed)   07/23/2013  Patient/family provided with [place] Department of Clinical Social Work's list of facilities offering this level of care within the geographic area requested by the patient (or if unable, by the patient's family).  07/23/2013  Patient/family informed of their freedom to choose among providers that offer the needed level of care, that participate in Medicare, Medicaid or managed care program needed by the patient, have an available bed and are willing to accept the patient.  07/23/2013  Patient/family informed of [facility]' ownership interest in [facility], as well as of the fact that they are under no obligation to receive care at this facility.  PASARR submitted to EDS on 07/23/2013 PASARR number received on 07/23/2013  FL2 transmitted to all facilities in geographic area requested by pt/family on  07/23/2013 FL2 transmitted to all facilities within larger geographic area on   Patient informed that his/her managed care company has contracts with or will negotiate with  certain facilities, including the following:     Patient/family informed of bed offers received:  07/23/2013 Patient chooses bed at  Physician recommends and patient chooses bed at    Patient to be transferred to  on   Patient to be transferred to facility by  Patient and family notified of transfer on  Name of family member notified:    The following physician request were entered in Epic:   Additional Comments:   Raynaldo Opitz, [place] Social Worker cell #: [phone]

## 2013-07-23 NOTE — Progress Notes (Signed)
Subjective: He has frequent hiccups, voice is normal, feels pervasive weakness and is unable to ambulate more than a few feet with a walker.  Objective: Vital signs in last 24 hours: Temp:  [97.9 F (36.6 C)-98.9 F (37.2 C)] 98.9 F (37.2 C) (06/29 0654) Pulse Rate:  [96-107] 97 (06/29 0654) Resp:  [18-20] 18 (06/29 0654) BP: (113-131)/(45-57) 131/57 mmHg (06/29 0654) SpO2:  [95 %] 95 % (06/29 0654) Weight change:    Intake/Output from previous day: 06/28 0701 - 06/29 0700 In: 3230 [P.O.:480; I.V.:2750] Out: 4300 [Urine:4300]   General appearance: alert, cooperative, mild distress and with frequent hiccups Resp: clear to auscultation bilaterally Cardio: regular rate and rhythm GI: soft, non-tender; bowel sounds normal; no masses,  no organomegaly Extremities: bilateral 1+ leg edema  Lab Results:  Recent Labs  07/20/13 0808 07/22/13 0900  WBC 9.4  --   HGB 9.3* 7.8*  HCT 29.5* 24.5*  PLT 192  --    BMET  Recent Labs  07/21/13 0715 07/22/13 0900  NA 136* 134*  K 5.2 4.9  CL 105 100  CO2 24 23  GLUCOSE 111* 134*  BUN 43* 34*  CREATININE 0.92 1.08  CALCIUM 7.6* 8.2*   CMET CMP     Component Value Date/Time   NA 134* 07/22/2013 0900   K 4.9 07/22/2013 0900   CL 100 07/22/2013 0900   CO2 23 07/22/2013 0900   GLUCOSE 134* 07/22/2013 0900   BUN 34* 07/22/2013 0900   CREATININE 1.08 07/22/2013 0900   CALCIUM 8.2* 07/22/2013 0900   PROT 4.4* 07/19/2013 0413   ALBUMIN 2.1* 07/19/2013 0413   AST 11 07/19/2013 0413   ALT 9 07/19/2013 0413   ALKPHOS 44 07/19/2013 0413   BILITOT <0.2* 07/19/2013 0413   GFRNONAA 66* 07/22/2013 0900   GFRAA 76* 07/22/2013 0900    CBG (last 3)   Recent Labs  07/21/13 1804  GLUCAP 230*    INR RESULTS:   Lab Results  Component Value Date   INR 1.00 09/04/2010     Studies/Results: No results found.  Medications: I have reviewed the patient's current medications.  Assessment/Plan: #1 Syncope: stable and likely due to  dehydration #2 Anemia: moderate with repeat CBC and iron studies pending. Will again try to contact his GI physician for consultation today. #3 Failure to thrive: will plan on transfer to a SNF for planned shortterm rehabilitation.  LOS: 6 days   Oconnell,Jordan G 07/23/2013, 8:06 AM

## 2013-07-23 NOTE — Progress Notes (Signed)
Clinical Social Work Department BRIEF PSYCHOSOCIAL ASSESSMENT 07/23/2013  Patient:  CASMIER, TARDIF     Account Number:  [id number]     Admit date:  07/17/2013  Clinical Social Worker:  Renold Genta  Date/Time:  07/23/2013 03:11 PM  Referred by:  Physician  Date Referred:  07/23/2013 Referred for  SNF Placement   Other Referral:   Interview type:  Patient Other interview type:   friend, [name] at bedside & son, sahith whitehall via phone    PSYCHOSOCIAL DATA Living Status:  WITH ADULT CHILDREN Admitted from facility:   Level of care:   Primary support name:  Merlene Pulling (friend) h#: [phone] c#: [phone] Primary support relationship to patient:  FRIEND Degree of support available:   good    CURRENT CONCERNS Current Concerns  Post-Acute Placement   Other Concerns:    SOCIAL WORK ASSESSMENT / PLAN CSW received consult from Dr. Philip Aspen for SNF placement. CSW also reviewed PT evaluation recommending home health on Thurs 6/25 but when PT worked with patient today, Mon 6/29 - they recommended Home Health vs. SNF.   Assessment/plan status:  Information/Referral to [employer] Other assessment/ plan:   Information/referral to community resources:   CSW completed FL2 and faxed information out to [facility] - provided bed offers to patient & friend at bedside.    PATIENT'S/FAMILY'S RESPONSE TO PLAN OF CARE: Patient & friend informed CSW that patient's son, Lamere Ahlert. "Corky" has recently moved him & his family down from [place] to live with patient and keep an eye on him. Patient had been to [facility] in February and went to [facility] through April 18th. CSW spoke with Narda Rutherford @ [facility] to confirm that they would be able to take patient once balance patient has remaining has been paid off.    Patient & friend state that they would like to speak with the MD & will let CSW know tomorrow of discharge plan.       Raynaldo Opitz, [facility] Clinical Social Worker cell #: [phone]

## 2013-07-23 NOTE — Progress Notes (Signed)
OT Cancellation Note  Patient Details Name: Jordan Oconnell MRN: [id number] DOB: 25-Aug-1940   Cancelled Treatment:    Reason Eval/Treat Not Completed: Fatigue/lethargy limiting ability to participate Pt sleeping soundly- pt awoke for a few seconds and would drift back off. Per chart ( MD note)  plan is now SNF Will recheck on pt as time allows.  Betsy Pries 07/23/2013, 12:21 PM

## 2013-07-23 NOTE — Progress Notes (Signed)
Physical Therapy Treatment Patient Details Name: Jordan Oconnell MRN: [id number] DOB: October 11, 1940 Today's Date: 07/23/2013    History of Present Illness 73 yo male admitted with syncope, collapse. Hx of bladder/kidney cancer-surgery, HTN, Afib. Recent d/c from [facility] 07/12/13    PT Comments    Pt appears slightly weaker this session compared to last. Still able to ambulate ~400 feet with walker however pt required 2 brief standing rest breaks. Discussed d/c plan-pt stated he was undecided if he was going home or to rehab.   Follow Up Recommendations  SNF vs Home health PT;Supervision/Assistance - 24 hour (pt states he is undecided about d/c plan. May benefit from [place] rehab at SNF if assist available at home is questionable. )     Equipment Recommendations  None recommended by PT    Recommendations for Other Services OT consult     Precautions / Restrictions Precautions Precautions: Fall Restrictions Weight Bearing Restrictions: No    Mobility  Bed Mobility Overal bed mobility: Modified Independent                Transfers   Equipment used: Rolling walker (2 wheeled) Transfers: Sit to/from Stand Sit to Stand: Min guard         General transfer comment: close guard for safety. Sit to stand x 2. Pt stood up on 1st attempt for ~10-15 seconds before having to sit and rest briefly. Stood again and was able to continue on with ambulation  Ambulation/Gait Ambulation/Gait assistance: Min guard Ambulation Distance (Feet): 400 Feet Assistive device: Rolling walker (2 wheeled) Gait Pattern/deviations: Step-through pattern;Trunk flexed     General Gait Details: dyspnea 2/4. fatigues fairly easily. slower gait speed than on last session. 2 brief standing rest breaks.    Stairs            Wheelchair Mobility    Modified Rankin (Stroke Patients Only)       Balance                                    Cognition Arousal/Alertness:  Awake/alert Behavior During Therapy: WFL for tasks assessed/performed Overall Cognitive Status: Within Functional Limits for tasks assessed                      Exercises General Exercises - Lower Extremity Hip Flexion/Marching: AROM;10 reps;Standing Heel Raises: AROM;10 reps;Standing Mini-Sqauts: AROM;10 reps;Standing    General Comments        Pertinent Vitals/Pain Pt denied pain    Home Living                      Prior Function            PT Goals (current goals can now be found in the care plan section) Progress towards PT goals: Progressing toward goals    Frequency  Min 3X/week    PT Plan Current plan remains appropriate    Co-evaluation             End of Session Equipment Utilized During Treatment: Gait belt Activity Tolerance: Patient limited by fatigue Patient left: in bed;with call bell/phone within reach (pt requested return to bed) Made nurse tech aware of pt's request for pitcher refill.      Time: [id number] PT Time Calculation (min): 25 min  Charges:  $Gait Training: 8-22 mins $Therapeutic Activity: 8-22 mins  G Codes:      Weston Anna, MPT Pager: [phone]

## 2013-07-23 NOTE — Progress Notes (Signed)
[facility] is providing the following services: Patient declined rw - already has one at home.   If patient discharges after hours, please call [phone].   Linward Headland 07/23/2013, 3:14 PM

## 2013-07-23 NOTE — Progress Notes (Signed)
     No additional cardiac issues. Rhythm is stable. Will sign off.  Call for questions.     Thayer Headings, Brooke Bonito., MD, [facility] 07/23/2013, 7:59 AM [address],  [place] Pager [phone]

## 2013-07-23 NOTE — Consult Note (Signed)
Referring Provider: No ref. provider found Primary Care Physician:  [name], MD Primary Gastroenterologist:  Dr. Henrene Pastor  Reason for Consultation:  IDA and hiccups  HPI: Jordan Oconnell is a 73 y.o. male with hypertension, bladder cancer, GERD, hyperlipidemia, and atrial fibrillation.  Known to Dr. Henrene Pastor.  Last colonoscopy was in 06/2012 at which time he was found to have a diminutive polyp found in the ascending colon; pathology actually ended up identifying benign colorectal mucosa.  He also had diverticulosis in the left colon.  It was still recommended that he have another colonoscopy in 5 years from that time due to a family history of colon cancer in his mother and father as well as a brother diagnosed in his 38's.   Patient was admitted to hospital on 6/23 due to syncopal episode and weakness.  Had recent surgery for bladder CA with ileal conduit formation.  Hgb was 10.8 grams on admission.  He was going to be discharged yesterday, 6/28, but recheck of Hgb was 7.8 grams.  He was not transfused, but refused to be discharged.  Hgb today was 8.0 grams.  MCV is normal at 89 but iron studies are low with iron <10.  Three days ago patient was FOBT positive.  He says that he does see occasional bright red blood on the toilet paper when he has to strain for a BM.  Denies any dark/black stools.  Denies NSAID use.  He has bee battling hiccups since February (since one of his surgeries).  Says that he has the hiccups all day every day.  Has been taking omeprazole 20 mg daily for a while (is on pantoprazole 40 mg daily while he is here).  Recently Reglan TID and thorazine prn was added to help with the hiccups.  Was on iron daily prior to admission.   Past Medical History  Diagnosis Date  . Bladder cancer   . Hypertension   . GERD (gastroesophageal reflux disease)   . Renal cell carcinoma 2008    left  . BPH (benign prostatic hyperplasia)   . A-fib     with ventricular response  .  Diverticulosis   . Colon polyps   . Hyperlipidemia     pt denies    Past Surgical History  Procedure Laterality Date  . Bladder surgery  2008    transurethral resection/resection of prostatic urethra  . Nephrectomy  2007    partial, left  . Nephrostomy  2011    stent  . Laparoscopic surgery  2012    laser   (?)  . US echocardiography  0000000    mild diastolic dysfunction,mild dilated LA,mild MR,AI,mildly dilated aortic root  . Nm myocar perf wall motion  09/08/2010    Normal  . Robot assisted laparoscopic complete cystect ileal conduit    . Appendectomy    . Bowel resection      Prior to Admission medications   Medication Sig Start Date End Date Taking? Authorizing Provider  ALPRAZolam (XANAX) 0.25 MG tablet Take 0.25 mg by mouth 3 (three) times daily as needed for anxiety.   Yes Historical Provider, MD  aspirin 81 MG tablet Take 162 mg by mouth every morning.    Yes Historical Provider, MD  chlorproMAZINE (THORAZINE) 25 MG tablet Take 25 mg by mouth 3 (three) times daily as needed for hiccoughs.   Yes Historical Provider, MD  diltiazem (TIAZAC) 180 MG 24 hr capsule Take 180 mg by mouth every morning.    Yes Historical  Provider, MD  enoxaparin (LOVENOX) 40 MG/0.4ML injection Inject 40 mg into the skin every morning.    Yes Historical Provider, MD  escitalopram (LEXAPRO) 10 MG tablet Take 1 tablet by mouth every morning. 11/08/12  Yes Historical Provider, MD  ferrous fumarate (HEMOCYTE - 106 MG FE) 325 (106 FE) MG TABS tablet Take 1 tablet by mouth every evening.   Yes Historical Provider, MD  flecainide (TAMBOCOR) 50 MG tablet Take 50 mg by mouth 2 (two) times daily.   Yes Historical Provider, MD  mirtazapine (REMERON) 7.5 MG tablet Take 7.5 mg by mouth at bedtime.   Yes Historical Provider, MD  omeprazole (PRILOSEC) 20 MG capsule Take 20 mg by mouth every morning.   Yes Historical Provider, MD  senna (SENOKOT) 8.6 MG TABS tablet Take 2 tablets by mouth 2 (two) times daily.    Yes Historical Provider, MD  traMADol (ULTRAM) 50 MG tablet Take 50 mg by mouth every 6 (six) hours as needed for moderate pain.   Yes Historical Provider, MD  vitamin C (ASCORBIC ACID) 500 MG tablet Take 500 mg by mouth every morning.   Yes Historical Provider, MD  feeding supplement, ENSURE COMPLETE, (ENSURE COMPLETE) LIQD Take 237 mLs by mouth 3 (three) times daily with meals. 07/22/13   Leanna Battles, MD  metoCLOPramide (REGLAN) 5 MG/5ML solution Take 5 mLs (5 mg total) by mouth 3 (three) times daily before meals. 07/22/13   Leanna Battles, MD  polyethylene glycol [facility] / Floria Raveling) packet Take 17 g by mouth 2 (two) times daily as needed for mild constipation. 07/22/13   Leanna Battles, MD    Current Facility-Administered Medications  Medication Dose Route Frequency Provider Last Rate Last Dose  . 0.9 % NaCl with KCl 20 mEq/ L  infusion   Intravenous Continuous Leanna Battles, MD 125 mL/hr at 07/23/13 1003    . alum & mag hydroxide-simeth (MAALOX/MYLANTA) 200-200-20 MG/5ML suspension 30 mL  30 mL Oral TID Leanna Battles, MD   30 mL at 07/23/13 0959  . aspirin chewable tablet 162 mg  162 mg Oral q morning - Dillard, MD   162 mg at 07/23/13 0810  . chlorproMAZINE (THORAZINE) tablet 50 mg  50 mg Oral TID PRN Sheela Stack, MD   50 mg at 07/22/13 0900  . diltiazem (CARDIZEM CD) 24 hr capsule 180 mg  180 mg Oral Daily Fay Records, MD   180 mg at 07/23/13 0959  . docusate sodium (COLACE) capsule 100 mg  100 mg Oral BID Sheela Stack, MD   100 mg at 07/23/13 0959  . enoxaparin (LOVENOX) injection 40 mg  40 mg Subcutaneous q morning - Reading, MD   40 mg at 07/23/13 0810  . escitalopram (LEXAPRO) tablet 10 mg  10 mg Oral q morning - 10a Sheela Stack, MD   10 mg at 07/23/13 0959  . feeding supplement (ENSURE COMPLETE) (ENSURE COMPLETE) liquid 237 mL  237 mL Oral TID WC Hazle Coca, RD   237 mL at 07/23/13 1330  . flecainide (TAMBOCOR) tablet  50 mg  50 mg Oral BID Sheela Stack, MD   50 mg at 07/23/13 0810  . magnesium citrate solution 1 Bottle  1 Bottle Oral BID PRN Sheela Stack, MD   1 Bottle at 07/20/13 [phone]  . magnesium citrate solution 1 Bottle  1 Bottle Oral Once Leanna Battles, MD      . menthol-cetylpyridinium (CEPACOL) lozenge 3 mg  1 lozenge Oral PRN [name], MD   3 mg at 07/19/13 1423  . metoCLOPramide (REGLAN) 5 MG/5ML solution 5 mg  5 mg Oral TID [facility] Leanna Battles, MD   5 mg at 07/23/13 1329  . mirtazapine (REMERON) tablet 7.5 mg  7.5 mg Oral QHS Sheela Stack, MD   7.5 mg at 07/22/13 2059  . nystatin (MYCOSTATIN) 100000 UNIT/ML suspension 500,000 Units  5 mL Oral QID Leanna Battles, MD   500,000 Units at 07/23/13 1332  . oxyCODONE-acetaminophen (PERCOCET/ROXICET) 5-325 MG per tablet 1-2 tablet  1-2 tablet Oral Q6H PRN Velna Hatchet, MD   2 tablet at 07/22/13 0101  . pantoprazole (PROTONIX) EC tablet 40 mg  40 mg Oral Daily Sheela Stack, MD   40 mg at 07/23/13 0810  . polyethylene glycol (MIRALAX / GLYCOLAX) packet 17 g  17 g Oral Daily Leanna Battles, MD   17 g at 07/23/13 0959    Allergies as of 07/17/2013 - Review Complete 07/17/2013  Allergen Reaction Noted  . Doxycycline Hives 05/19/2012  . Flomax [tamsulosin hcl] Other (See Comments) 08/31/2012    Family History  Problem Relation Age of Onset  . Ovarian cancer Mother   . Colon cancer Mother   . Colon cancer Father   . Crohn's disease Son   . Breast cancer Daughter   . Colon cancer Brother     History   Social History  . Marital Status: Divorced    Spouse Name: N/A    Number of Children: 2  . Years of Education: N/A   Occupational History  . teacher    Social History Main Topics  . Smoking status: Never Smoker   . Smokeless tobacco: Never Used  . Alcohol Use: Yes     Comment: occasionally  . Drug Use: No  . Sexual Activity: Not on file   Other Topics Concern  . Not on file   Social History Narrative    . No narrative on file    Review of Systems: Ten point ROS is O/W negative except as mentioned in HPI.  Physical Exam: Vital signs in last 24 hours: Temp:  [97.9 F (36.6 C)-98.9 F (37.2 C)] 98.6 F (37 C) (06/29 1450) Pulse Rate:  [94-97] 94 (06/29 1450) Resp:  [18-20] 20 (06/29 1450) BP: (115-135)/(55-58) 135/58 mmHg (06/29 1450) SpO2:  [95 %-99 %] 99 % (06/29 1450) Last BM Date: 07/21/13 General:  Alert, Well-developed, well-nourished, pleasant and cooperative in NAD Head:  Normocephalic and atraumatic. Eyes:  Sclera clear, no icterus.  Conjunctiva pink. Ears:  Normal auditory acuity. Mouth:  No deformity or lesions.   Lungs:  Clear throughout to auscultation.  No wheezes, crackles, or rhonchi.  Heart:  Regular rate and rhythm; no murmurs, clicks, rubs, or gallops. Abdomen:  Soft, non-distended.  BS present.  Non-tender.  Ileal conduit noted in RLQ without bleeding.   Rectal:  Deferred  Msk:  Symmetrical without gross deformities. Pulses:  Normal pulses noted. Extremities:  Slight edema noted in B/L LE's. Neurologic:  Alert and  oriented x4;  grossly normal neurologically. Skin:  Intact without significant lesions or rashes. Psych:  Alert and cooperative. Normal mood and affect.  Intake/Output from previous day: 06/28 0701 - 06/29 0700 In: 3230 [P.O.:480; I.V.:2750] Out: 4300 [Urine:4300] Intake/Output this shift: Total I/O In: 360 [P.O.:360] Out: 1701 [Urine:1700; Stool:1]  Lab Results:  Recent Labs  07/22/13 0900 07/23/13 0748  WBC  --  22.0*  HGB 7.8* 8.0*  HCT  24.5* 25.0*  PLT  --  PLATELET CLUMPS NOTED ON SMEAR, UNABLE TO ESTIMATE   BMET  Recent Labs  07/21/13 0715 07/22/13 0900 07/23/13 0748  NA 136* 134* 136*  K 5.2 4.9 4.6  CL 105 100 102  CO2 24 23 21   GLUCOSE 111* 134* 118*  BUN 43* 34* 33*  CREATININE 0.92 1.08 1.06  CALCIUM 7.6* 8.2* 8.0*   IMPRESSION:  -IDA:  Recent 3 gram drop in Hgb.  Had a heme positive stool, but no overt  bleeding (occasional BRB with hard stool/straining). -Hiccups:  ? Source.   -Bladder CA s/p several surgeries recently with ileal conduit  PLAN: -Would consider transfusion particularly for Hgb <8 grams. -Will plan for EGD tomorrow, 6/30, to rule out any source of GI bleeding and cause for hiccups.  Will discontinue lovenox until after procedure (receives one dose daily at 10 am). -Continue iron supplementation upon discharge. -Would discharge him on higher dose PPI (omeprazole 40 mg instead of 20 mg).  Is taking pantoprazole 40 mg daily here. -Continue with Reglan 5 mg TID for now and thorazine prn.   ZEHR, JESSICA D.  07/23/2013, 4:19 PM  Pager number [phone]  GI ATTENDING  History, labs, x-rays reviewed. Agree with above H&P. Patient know to me for multiple prior colonoscopies due to family history. Last exam one year ago was negative for neoplasia. Problems with Urologic cancer. Anemic since at least last fall. Normocytic with low iron and + stool. Chronic hiccups as noted. As above, plan EGD in am. Transfuse as needed for fatigue and increase PPI to see if this helps hiccups. Thank you.  Docia Chuck. Geri Seminole., M.D. [facility] Division of Gastroenterology

## 2013-07-24 ENCOUNTER — Inpatient Hospital Stay (HOSPITAL_COMMUNITY): Payer: Medicare Other

## 2013-07-24 ENCOUNTER — Encounter (HOSPITAL_COMMUNITY): Payer: Self-pay | Admitting: *Deleted

## 2013-07-24 ENCOUNTER — Encounter (HOSPITAL_COMMUNITY)
Admission: EM | Disposition: A | Discharge status: Skilled Nursing Facility | Payer: Self-pay | Source: Home / Self Care | Attending: Internal Medicine

## 2013-07-24 ENCOUNTER — Encounter (HOSPITAL_COMMUNITY): Payer: Medicare Other | Admitting: Anesthesiology

## 2013-07-24 ENCOUNTER — Inpatient Hospital Stay (HOSPITAL_COMMUNITY): Payer: Medicare Other | Admitting: Anesthesiology

## 2013-07-24 HISTORY — PX: ESOPHAGOGASTRODUODENOSCOPY: SHX5428

## 2013-07-24 LAB — CBC
HEMATOCRIT: 19.4 % — AB (ref 39.0–52.0)
HEMOGLOBIN: 6.2 g/dL — AB (ref 13.0–17.0)
MCH: 28.3 pg (ref 26.0–34.0)
MCHC: 32 g/dL (ref 30.0–36.0)
MCV: 88.6 fL (ref 78.0–100.0)
Platelets: 181 10*3/uL (ref 150–400)
RBC: 2.19 MIL/uL — ABNORMAL LOW (ref 4.22–5.81)
RDW: 16.2 % — ABNORMAL HIGH (ref 11.5–15.5)
WBC: 10.2 10*3/uL (ref 4.0–10.5)

## 2013-07-24 LAB — BASIC METABOLIC PANEL
BUN: 27 mg/dL — AB (ref 6–23)
CHLORIDE: 105 meq/L (ref 96–112)
CO2: 24 meq/L (ref 19–32)
Calcium: 7.7 mg/dL — ABNORMAL LOW (ref 8.4–10.5)
Creatinine, Ser: 0.97 mg/dL (ref 0.50–1.35)
GFR calc Af Amer: >90 mL/min (ref >90)
GFR calc non Af Amer: 79 mL/min — ABNORMAL LOW (ref >90)
GLUCOSE: 84 mg/dL (ref 70–99)
POTASSIUM: 4.9 meq/L (ref 3.7–5.3)
Sodium: 137 mEq/L (ref 137–147)

## 2013-07-24 LAB — CULTURE, BLOOD (ROUTINE X 2): CULTURE: NO GROWTH

## 2013-07-24 LAB — HEMOGLOBIN AND HEMATOCRIT, BLOOD
HCT: 28.3 % — ABNORMAL LOW (ref 39.0–52.0)
Hemoglobin: 9 g/dL — ABNORMAL LOW (ref 13.0–17.0)

## 2013-07-24 LAB — PREPARE RBC (CROSSMATCH)

## 2013-07-24 SURGERY — EGD (ESOPHAGOGASTRODUODENOSCOPY)
Anesthesia: Monitor Anesthesia Care

## 2013-07-24 MED ORDER — FERROUS FUMARATE 325 (106 FE) MG PO TABS: 1.0000 | ORAL_TABLET | Freq: Two times a day (BID) | ORAL | Status: DC

## 2013-07-24 MED ORDER — SODIUM CHLORIDE 0.9 % IV SOLN: INTRAVENOUS | Status: DC

## 2013-07-24 MED ORDER — VITAMIN C 500 MG PO TABS: 500.0000 mg | ORAL_TABLET | Freq: Two times a day (BID) | ORAL | Status: DC

## 2013-07-24 MED ORDER — LIDOCAINE HCL 1 % IJ SOLN
INTRAMUSCULAR | Status: DC | PRN
  Administered 2013-07-24: 40 mg via INTRADERMAL

## 2013-07-24 MED ORDER — METOCLOPRAMIDE HCL 5 MG/5ML PO SOLN: 5.0000 mg | Freq: Every morning | ORAL | Status: DC

## 2013-07-24 MED ORDER — PROPOFOL 10 MG/ML IV BOLUS
INTRAVENOUS | Status: AC
  Filled 2013-07-24: qty 20

## 2013-07-24 MED ORDER — PANTOPRAZOLE SODIUM 40 MG PO TBEC
40.0000 mg | DELAYED_RELEASE_TABLET | Freq: Two times a day (BID) | ORAL | Status: DC
  Administered 2013-07-24 – 2013-07-27 (×6): 40 mg via ORAL
  Filled 2013-07-24 (×7): qty 1

## 2013-07-24 MED ORDER — PROPOFOL INFUSION 10 MG/ML OPTIME
INTRAVENOUS | Status: DC | PRN
  Administered 2013-07-24: 140 ug/kg/min via INTRAVENOUS

## 2013-07-24 MED ORDER — MIDAZOLAM HCL 2 MG/2ML IJ SOLN
INTRAMUSCULAR | Status: AC
  Filled 2013-07-24: qty 2

## 2013-07-24 MED ORDER — MIDAZOLAM HCL 5 MG/5ML IJ SOLN
INTRAMUSCULAR | Status: DC | PRN
  Administered 2013-07-24: 1 mg via INTRAVENOUS

## 2013-07-24 MED ORDER — LACTATED RINGERS IV SOLN
INTRAVENOUS | Status: DC | PRN
  Administered 2013-07-24: 14:00:00 via INTRAVENOUS

## 2013-07-24 MED ORDER — LIDOCAINE HCL (CARDIAC) 20 MG/ML IV SOLN
INTRAVENOUS | Status: AC
  Filled 2013-07-24: qty 5

## 2013-07-24 MED ORDER — OMEPRAZOLE 20 MG PO CPDR
40.0000 mg | DELAYED_RELEASE_CAPSULE | Freq: Every day | ORAL | Status: DC
Start: 2013-07-24 — End: 2021-02-20

## 2013-07-24 MED ORDER — NYSTATIN 100000 UNIT/ML MT SUSP: 5.0000 mL | Freq: Four times a day (QID) | OROMUCOSAL | Status: DC

## 2013-07-24 MED ORDER — BUTAMBEN-TETRACAINE-BENZOCAINE 2-2-14 % EX AERO
INHALATION_SPRAY | CUTANEOUS | Status: DC | PRN
  Administered 2013-07-24: 1 via TOPICAL

## 2013-07-24 NOTE — Progress Notes (Signed)
Patient is coughing up pinkish sputum this morning.

## 2013-07-24 NOTE — Op Note (Signed)
[facility] [place], [address]    ENDOSCOPY PROCEDURE REPORT PATIENT: Jordan Oconnell, Jordan Oconnell  MR#: [id number] BIRTHDATE: 11-26-1940 , 73  yrs. old GENDER: Male ENDOSCOPIST: Eustace Quail, MD REFERRED BY:  Leanna Battles, M.D. PROCEDURE DATE:  07/24/2013 PROCEDURE:  EGD, diagnostic ASA CLASS:     Class III INDICATIONS:  Iron deficiency anemia.   Heme positive stool. MEDICATIONS: MAC sedation, administered by CRNA and See Anesthesia Report. TOPICAL ANESTHETIC: Cetacaine Spray DESCRIPTION OF PROCEDURE: After the risks benefits and alternatives of the procedure were thoroughly explained, informed consent was obtained.  The [place] [id number] endoscope was introduced through the mouth and advanced to the second portion of the duodenum. Without limitations.  The instrument was slowly withdrawn as the mucosa was fully examined.  EXAM:The esophagus revealed erosive esophagitis involving the distal third.  There was linear ulceration and mucosal friability.  Benign stricture (large-caliber) at the gastroesophageal junction.  The stomach revealed moderate size sliding hiatal hernia but was otherwise normal.  The duodenum was normal.  Retroflexed views revealed a hiatal hernia.     The scope was then withdrawn from the patient and the procedure completed.  COMPLICATIONS: There were no complications. ENDOSCOPIC IMPRESSION: 1. Moderately severe erosive esophagitis (reflux-induced). Certainly explains Hemoccult-positive stool. May contribute to anemia, but would not account for significant drop in hemoglobin during this hospital stay. May also explain chronic hiccups.  RECOMMENDATIONS: 1.  Anti-reflux regimen to be followed 2.  Increase omeprazole from 20 mg daily to 40 mg twice a day 3. Noncontrast CT of the abdomen and pelvis. "Rule out hematoma"  Discussed with patient and daughter.  REPEAT EXAM:  eSigned:  Eustace Quail, MD 07/24/2013  2:21 PM   [id number] Reynaldo Minium, MD, Leanna Battles, MD, and The Patient  PATIENT NAME:  Jordan Oconnell, Jordan Oconnell MR#: [id number]

## 2013-07-24 NOTE — Transfer of Care (Signed)
Immediate Anesthesia Transfer of Care Note  Patient: Jordan Oconnell  Procedure(s) Performed: Procedure(s): ESOPHAGOGASTRODUODENOSCOPY (EGD) (N/A)  Patient Location: PACU and Endoscopy Unit  Anesthesia Type:MAC  Level of Consciousness: awake  Airway & Oxygen Therapy: Patient Spontanous Breathing and Patient connected to nasal cannula oxygen  Post-op Assessment: Report given to PACU RN, Post -op Vital signs reviewed and stable and Patient moving all extremities  Post vital signs: Reviewed and stable  Complications: No apparent anesthesia complications

## 2013-07-24 NOTE — Progress Notes (Signed)
Note that the patients case was discussed with his PCP, Dr. Reynaldo Minium, with a plan for discharge to home today after GI evaluation, with patient to hand carry a copy of hospital discharge summary to his planned followup visit with Dr. [name]. His son requests a direct transfer to [facility]. I will contact Dr. [name] to see if this is possible, and will leave a note on chart as to this issue.

## 2013-07-24 NOTE — Progress Notes (Signed)
GI ATTENDING  EGD showed erosive esophagitis (see report). CT scan did not demonstrate intra-abdominal hematoma.  Recommend high-dose PPI (ordered). PPI should heal erosive esophagitis and possibly hiccups. No further recommendations or plans from GI standpoint.  Discussed with patient and daughter (in person earlier and again by telephone tonight). Will sign off.  Docia Chuck. Geri Seminole., M.D. [facility] Division of Gastroenterology

## 2013-07-24 NOTE — Anesthesia Preprocedure Evaluation (Addendum)
Anesthesia Evaluation  Patient identified by MRN, date of birth, ID band Patient awake    Reviewed: Allergy & Precautions, H&P , NPO status , Patient's Chart, lab work & pertinent test results  Airway Mallampati: II TM Distance: >3 FB Neck ROM: Full    Dental  (+) Dental Advisory Given   Pulmonary neg pulmonary ROS,  breath sounds clear to auscultation        Cardiovascular hypertension, Pt. on medications + dysrhythmias Atrial Fibrillation + Valvular Problems/Murmurs AI and MR Rhythm:Regular Rate:Normal     Neuro/Psych negative neurological ROS  negative psych ROS   GI/Hepatic Neg liver ROS, GERD-  Medicated,  Endo/Other  negative endocrine ROS  Renal/GU Renal disease     Musculoskeletal negative musculoskeletal ROS (+)   Abdominal   Peds  Hematology  (+) anemia ,   Anesthesia Other Findings   Reproductive/Obstetrics negative OB ROS                         Anesthesia Physical Anesthesia Plan  ASA: III  Anesthesia Plan: MAC   Post-op Pain Management:    Induction: Intravenous  Airway Management Planned:   Additional Equipment:   Intra-op Plan:   Post-operative Plan:   Informed Consent: I have reviewed the patients History and Physical, chart, labs and discussed the procedure including the risks, benefits and alternatives for the proposed anesthesia with the patient or authorized representative who has indicated his/her understanding and acceptance.   Dental advisory given  Plan Discussed with: CRNA  Anesthesia Plan Comments:         Anesthesia Quick Evaluation

## 2013-07-24 NOTE — Progress Notes (Signed)
CRITICAL VALUE ALERT  Critical value received:  Hgb 6.2  Date of notification:  6/30  Time of notification:  0500  Critical value read back:Yes.    Nurse who received alert:  Virgina [place]   MD notified (1st page):    Time of first page:  [phone]  MD notified (2nd page):  Time of second page:  Responding MD: [place]   Time MD responded:  0518  No signs of active bleeding. MD on call made aware. No new orders placed. Will continue to monitor closely.

## 2013-07-24 NOTE — Progress Notes (Signed)
     [facility] Progress Note  Subjective:  Had a heme positive stool again yesterday, but no overt GI bleeding.  Spitting up some blood tinged sputum/phlegm since last evening.    Objective:  Vital signs in last 24 hours: Temp:  [98.1 F (36.7 C)-99.4 F (37.4 C)] 98.1 F (36.7 C) (06/30 0556) Pulse Rate:  [94-100] 100 (06/30 0556) Resp:  [18-20] 18 (06/30 0556) BP: (129-135)/(51-58) 130/56 mmHg (06/30 0556) SpO2:  [97 %-99 %] 98 % (06/30 0556) Last BM Date: 07/23/13 General:  Alert, Well-developed, in NAD Heart:  Regular rate and rhythm; no murmurs Pulm:  CTAB.  No W/R/R. Abdomen:  Soft, non-distended. Normal bowel sounds.  Non-tender.    Extremities:  Some edema noted in B/L LE's. Neurologic:  Alert and  oriented x4;  grossly normal neurologically. Psych:  Alert and cooperative. Normal mood and affect.  Intake/Output from previous day: 06/29 0701 - 06/30 0700 In: 3730 [P.O.:480; I.V.:3250] Out: 4176 [Urine:4175; Stool:1]  Lab Results:  Recent Labs  07/22/13 0900 07/23/13 0748 07/24/13 0430  WBC  --  22.0* 10.2  HGB 7.8* 8.0* 6.2*  HCT 24.5* 25.0* 19.4*  PLT  --  PLATELET CLUMPS NOTED ON SMEAR, UNABLE TO ESTIMATE 181   BMET  Recent Labs  07/22/13 0900 07/23/13 0748 07/24/13 0430  NA 134* 136* 137  K 4.9 4.6 4.9  CL 100 102 105  CO2 23 21 24   GLUCOSE 134* 118* 84  BUN 34* 33* 27*  CREATININE 1.08 1.06 0.97  CALCIUM 8.2* 8.0* 7.7*   Assessment / Plan: -IDA: Recent 3 gram drop in Hgb with 2 grams more decrease acutely overnight. Had a heme positive stool again yesterday, but no overt GI bleeding.  -Hiccups: ? Source.  -Bladder CA s/p several surgeries recently with ileal conduit   -Receiving two units of blood today.  I have asked his nurse to place a second IV site so that he can continue to be transfused while having EGD later today.  -Will plan for EGD today at 1300.  Lovenox is on hold. -Continue iron supplementation upon discharge.    -Would discharge him on higher dose PPI (omeprazole 40 mg instead of 20 mg). Is taking pantoprazole 40 mg daily here.  -Continue with Reglan 5 mg TID for now and thorazine prn.    LOS: 7 days   ZEHR, JESSICA D.  07/24/2013, 9:50 AM  Pager number [phone]  GI ATTENDING  Interval history and data reviewed. Agree with H&P as above. Significant drop in hemoglobin without obvious GI bleeding. Does complain of increased lower abdominal pressure. Daughter at bedside. Plan EGD today as previously discussed. The nature of the procedure, as well as the risks, benefits, and alternatives were carefully and thoroughly reviewed with the patient. Ample time for discussion and questions allowed. The patient understood, was satisfied, and agreed to proceed.. If EGD negative, would do a noncontrast enhanced CT of the abdomen and pelvis to rule out interval development of hematoma. Discussed with patient and daughter.  Docia Chuck. Geri Seminole., M.D. [facility] Division of Gastroenterology

## 2013-07-24 NOTE — Progress Notes (Signed)
Patient would like to be transferred to [facility]. Son Suhaas Gauer stated that he feels h feel more comfortable if he was under the care of Dr. [name] his oncology surgeon. Will notify Dr. Philip Aspen. Will continue to monitor patient.  Lenise Herald   Dr. [name] (surgeon) at [facility]

## 2013-07-24 NOTE — Progress Notes (Signed)
CSW spoke with patient & friend, [name] at bedside re: discharge plan. [name] states that plan is to either transfer to [facility] or discharge home and will go to [facility] for his appointment on Wednesday. CSW explained that if patient feels as though he needs to go to SNF after [facility] - he would need to go through his PCP.   No further CSW needs identified - CSW signing off.   Raynaldo Opitz, [facility] Clinical Social Worker cell #: [phone]

## 2013-07-24 NOTE — Anesthesia Postprocedure Evaluation (Signed)
Anesthesia Post Note  Patient: Jordan Oconnell  Procedure(s) Performed: Procedure(s) (LRB): ESOPHAGOGASTRODUODENOSCOPY (EGD) (N/A)  Anesthesia type: MAC  Patient location: PACU  Post pain: Pain level controlled  Post assessment: Post-op Vital signs reviewed  Last Vitals: BP 132/65  Pulse 83  Temp(Src) 36.7 C (Oral)  Resp 20  Ht 6\' 1"  (1.854 m)  Wt 145 lb 15.1 oz (66.2 kg)  BMI 19.26 kg/m2  SpO2 94%  Post vital signs: Reviewed  Level of consciousness: awake  Complications: No apparent anesthesia complications

## 2013-07-24 NOTE — Progress Notes (Signed)
OT Cancellation Note  Patient Details Name: Jordan Oconnell MRN: [id number] DOB: July 11, 1940   Cancelled Treatment:    Reason Eval/Treat Not Completed: Other (comment)  Pt is s/p endoscopy.  Will check back tomorrow as schedule allows.  [name],[name] 07/24/2013, 3:15 PM Lesle Chris, OTR/L [phone] 07/24/2013

## 2013-07-25 ENCOUNTER — Encounter (HOSPITAL_COMMUNITY): Payer: Self-pay | Admitting: Internal Medicine

## 2013-07-25 DIAGNOSIS — J189 Pneumonia, unspecified organism: Secondary | ICD-10-CM | POA: Diagnosis not present

## 2013-07-25 DIAGNOSIS — R042 Hemoptysis: Secondary | ICD-10-CM | POA: Diagnosis present

## 2013-07-25 LAB — BASIC METABOLIC PANEL
BUN: 24 mg/dL — AB (ref 6–23)
CALCIUM: 7.9 mg/dL — AB (ref 8.4–10.5)
CHLORIDE: 103 meq/L (ref 96–112)
CO2: 25 meq/L (ref 19–32)
CREATININE: 0.94 mg/dL (ref 0.50–1.35)
GFR calc Af Amer: >90 mL/min (ref >90)
GFR calc non Af Amer: 80 mL/min — ABNORMAL LOW (ref >90)
Glucose, Bld: 123 mg/dL — ABNORMAL HIGH (ref 70–99)
Potassium: 4.5 mEq/L (ref 3.7–5.3)
Sodium: 138 mEq/L (ref 137–147)

## 2013-07-25 LAB — TYPE AND SCREEN
ABO/RH(D): O POS
Antibody Screen: NEGATIVE
UNIT DIVISION: 0
Unit division: 0

## 2013-07-25 LAB — CBC
HEMATOCRIT: 24.4 % — AB (ref 39.0–52.0)
Hemoglobin: 8.3 g/dL — ABNORMAL LOW (ref 13.0–17.0)
MCH: 29.5 pg (ref 26.0–34.0)
MCHC: 34 g/dL (ref 30.0–36.0)
MCV: 86.8 fL (ref 78.0–100.0)
Platelets: 182 10*3/uL (ref 150–400)
RBC: 2.81 MIL/uL — ABNORMAL LOW (ref 4.22–5.81)
RDW: 16.7 % — AB (ref 11.5–15.5)
WBC: 8.3 10*3/uL (ref 4.0–10.5)

## 2013-07-25 MED ORDER — PIPERACILLIN-TAZOBACTAM 3.375 G IVPB
3.3750 g | Freq: Three times a day (TID) | INTRAVENOUS | Status: DC
  Administered 2013-07-25 – 2013-07-27 (×7): 3.375 g via INTRAVENOUS
  Filled 2013-07-25 (×8): qty 50

## 2013-07-25 MED ORDER — VANCOMYCIN HCL 10 G IV SOLR
1500.0000 mg | Freq: Once | INTRAVENOUS | Status: AC
  Administered 2013-07-25: 1500 mg via INTRAVENOUS
  Filled 2013-07-25: qty 1500

## 2013-07-25 MED ORDER — VANCOMYCIN HCL IN DEXTROSE 750-5 MG/150ML-% IV SOLN
750.0000 mg | Freq: Two times a day (BID) | INTRAVENOUS | Status: DC
  Administered 2013-07-25 – 2013-07-27 (×4): 750 mg via INTRAVENOUS
  Filled 2013-07-25 (×4): qty 150

## 2013-07-25 NOTE — Progress Notes (Signed)
Subjective: He is no longer having burping, and continues to have mild hemoptysis, no purulent sputum or fever or dyspnea Objective: Vital signs in last 24 hours: Temp:  [97.6 F (36.4 C)-98.8 F (37.1 C)] 97.6 F (36.4 C) (07/01 0646) Pulse Rate:  [80-96] 91 (07/01 0646) Resp:  [18-24] 18 (07/01 0646) BP: (116-150)/(50-81) 148/65 mmHg (07/01 0646) SpO2:  [93 %-98 %] 96 % (07/01 0646) Weight change:    Intake/Output from previous day: 06/30 0701 - 07/01 0700 In: 1764.6 [P.O.:600; I.V.:500; Blood:664.6] Out: 5900 [Urine:5900]   General appearance: alert, cooperative and no distress Resp: crackles lower 1/2 of right hemithorax Cardio: regular rate and rhythm GI: soft, non-tender; bowel sounds normal; no masses,  no organomegaly Extremities: extremities normal, atraumatic, no cyanosis or edema  Lab Results:  Recent Labs  07/24/13 0430 07/24/13 1927 07/25/13 0001  WBC 10.2  --  8.3  HGB 6.2* 9.0* 8.3*  HCT 19.4* 28.3* 24.4*  PLT 181  --  182   BMET  Recent Labs  07/24/13 0430 07/25/13 0001  NA 137 138  K 4.9 4.5  CL 105 103  CO2 24 25  GLUCOSE 84 123*  BUN 27* 24*  CREATININE 0.97 0.94  CALCIUM 7.7* 7.9*   CMET CMP     Component Value Date/Time   NA 138 07/25/2013 0001   K 4.5 07/25/2013 0001   CL 103 07/25/2013 0001   CO2 25 07/25/2013 0001   GLUCOSE 123* 07/25/2013 0001   BUN 24* 07/25/2013 0001   CREATININE 0.94 07/25/2013 0001   CALCIUM 7.9* 07/25/2013 0001   PROT 4.4* 07/19/2013 0413   ALBUMIN 2.1* 07/19/2013 0413   AST 11 07/19/2013 0413   ALT 9 07/19/2013 0413   ALKPHOS 44 07/19/2013 0413   BILITOT <0.2* 07/19/2013 0413   GFRNONAA 80* 07/25/2013 0001   GFRAA >90 07/25/2013 0001    CBG (last 3)  No results found for this basename: GLUCAP,  in the last 72 hours  INR RESULTS:   Lab Results  Component Value Date   INR 1.00 09/04/2010     Studies/Results: Ct Abdomen Pelvis Wo Contrast  07/24/2013   CLINICAL DATA:  Bladder cancer, question hematoma. History  of bladder cancer with several surgeries, recently with ileal conduit. Dropping hemoglobin. EGD on 07/24/2013 showed erosive esophagitis with ulceration and mucosal friability, with a moderate-sized hiatal hernia.  EXAM: CT ABDOMEN AND PELVIS WITHOUT CONTRAST  TECHNIQUE: Multidetector CT imaging of the abdomen and pelvis was performed following the standard protocol without IV contrast.  COMPARISON:  COMPARISON 03/26/2013  FINDINGS: Airspace opacities with some volume loss in the lingula. Right lower lobe airspace opacity may reflect pneumonia or pulmonary hemorrhage. Atelectasis in the left lower lobe. Small left and trace right pleural effusions.  Coronary atherosclerosis.  Low-density blood pool favoring anemia.  Small type 1 hiatal hernia.  Small hypodense lesions in the left hepatic lobe.  Accounting for the motion artifact, the gallbladder appears unremarkable. Adrenal glands normal.  There is stent extending from the left collecting system, crossing the midline in the dilated left ureter to the right collecting system, which thin extends caudad and down through end ileostomy site. The functional ureters are dilated and may actually represent components of an ileal pouch.  Prior nephrostomy tubes have been removed. Small nonobstructive bilateral renal calculi are observed in the collecting systems.  Bilateral perirenal stranding is increased compared to prior. No pathologic upper abdominal adenopathy is observed. Aortoiliac atherosclerotic vascular disease.  No pathologic pelvic adenopathy  is observed. Prior pelvic lymph node dissection noted.  No muscular compartment hematoma is identified. No ascites or unexpected hematoma in the abdomen or pelvis.  IMPRESSION: 1. No hematoma identified. Of note, there is airspace opacity in the right lower lobe which appears consolidated, and could represent pulmonary hemorrhage or pneumonia. 2. Scattered atelectasis in the left lung, with small left and trace right pleural  effusions. 3. Dilated functional ureters, some of which may actually represent ileum, with left-sided crossing to the right in joining near the right UPJ, with a right sac connecting to an ileostomy. Left double-J ureteral stent extends over into the right collecting system 4. Ancillary findings include hypodense left hepatic lobe lesions; coronary atherosclerosis; low-density blood pool favoring anemia; increased bilateral perirenal stranding ; and small nonobstructive bilateral renal calculi.   Electronically Signed   By: Sherryl Barters M.D.   On: 07/24/2013 16:31    Medications: I have reviewed the patient's current medications.  Assessment/Plan: #1 Anemia: hemoglobin continues to trend downward after transfusion, could be due to hemodilution or pulmonary hemorrhage. CT findings are nonspecific and could be due to pulmonary hemorrrhage or pneumonia. Lovenox and apirin were stopped yesterday. Will request pulmonary specialist evaluation today. Will start broad spectrum antibiotics for now. #2 Weakness: moderate with gait instability. Will plan on transfer to a SNF when the anemia and pulmonary issues have resolved.   LOS: 8 days   Ahnya Akre G 07/25/2013, 7:15 AM

## 2013-07-25 NOTE — Progress Notes (Signed)
ANTIBIOTIC CONSULT NOTE - INITIAL  Pharmacy Consult for vancomycin/zosyn Indication: rule out pneumonia  Allergies  Allergen Reactions  . Doxycycline Hives  . Flomax [Tamsulosin Hcl] Other (See Comments)    Dizzy     Patient Measurements: Height: 6\' 1"  (185.4 cm) Weight: 145 lb 15.1 oz (66.2 kg) IBW/kg (Calculated) : 79.9 Adjusted Body Weight:   Vital Signs: Temp: 97.6 F (36.4 C) (07/01 0646) Temp src: Oral (07/01 0646) BP: 148/65 mmHg (07/01 0646) Pulse Rate: 91 (07/01 0646) Intake/Output from previous day: 06/30 0701 - 07/01 0700 In: 1764.6 [P.O.:600; I.V.:500; Blood:664.6] Out: 5900 [Urine:5900] Intake/Output from this shift:    Labs:  Recent Labs  07/23/13 0748 07/24/13 0430 07/24/13 1927 07/25/13 0001  WBC 22.0* 10.2  --  8.3  HGB 8.0* 6.2* 9.0* 8.3*  PLT PLATELET CLUMPS NOTED ON SMEAR, UNABLE TO ESTIMATE 181  --  182  CREATININE 1.06 0.97  --  0.94   Estimated Creatinine Clearance: 64.6 ml/min (by C-G formula based on Cr of 0.94). No results found for this basename: VANCOTROUGH, Corlis Leak, VANCORANDOM, GENTTROUGH, GENTPEAK, GENTRANDOM, TOBRATROUGH, TOBRAPEAK, TOBRARND, AMIKACINPEAK, AMIKACINTROU, AMIKACIN,  in the last 72 hours   Microbiology: Recent Results (from the past 720 hour(s))  CULTURE, BLOOD (ROUTINE X 2)     Status: None   Collection Time    07/17/13 11:43 AM      Result Value Ref Range Status   Specimen Description BLOOD LEFT FOREARM   Final   Special Requests BOTTLES DRAWN AEROBIC ONLY 1 CC   Final   Culture  Setup Time     Final   Value: 07/17/2013 14:57     Performed at [facility]   Culture     Final   Value: NO GROWTH 5 DAYS     Performed at [facility]   Report Status 07/23/2013 FINAL   Final  URINE CULTURE     Status: None   Collection Time    07/17/13 11:46 AM      Result Value Ref Range Status   Specimen Description URINE, CLEAN CATCH   Final   Special Requests NONE   Final   Culture  Setup Time      Final   Value: 07/17/2013 14:23     Performed at [place]     Final   Value: >=100,000 COLONIES/ML     Performed at [facility]   Culture     Final   Value: Multiple bacterial morphotypes present, none predominant. Suggest appropriate recollection if clinically indicated.     Performed at [facility]   Report Status 07/18/2013 FINAL   Final  CULTURE, BLOOD (ROUTINE X 2)     Status: None   Collection Time    07/17/13 12:37 PM      Result Value Ref Range Status   Specimen Description BLOOD   Final   Special Requests     Final   Value: BOTTLES DRAWN AEROBIC AND ANAEROBIC RIGHT FOOT 3.5 CC   Culture  Setup Time     Final   Value: 07/18/2013 07:52     Performed at [facility]   Culture     Final   Value: NO GROWTH 5 DAYS     Performed at [facility]   Report Status 07/24/2013 FINAL   Final    Medical History: Past Medical History  Diagnosis Date  . Bladder cancer   . Hypertension   .  GERD (gastroesophageal reflux disease)   . Renal cell carcinoma 2008    left  . BPH (benign prostatic hyperplasia)   . A-fib     with ventricular response  . Diverticulosis   . Colon polyps   . Hyperlipidemia     pt denies   Assessment: 89 YOM admitted 6/23 with syncope (determined to be most likely from dehydration).  He has h/o urothelial carcinoma. CT from 6/30 revealed RLL airspace opacity possibly from pneumonia or pulmonary hemorrhage. EGD 6/30 = erosive esophagitis.  Starting antibiotics for possible pneumonia and consult pulmonary  7/1 >> vancomycin  >> 7/1 >> zosyn  >>    Tmax: afebrile WBCs: WNL Renal: SCr WNL, CrCL (N) = 44ml/min  6/23 blood: NG x 5 days 6/23 urine: >100K multiple morphologies  Goal of Therapy:  Vancomycin trough level 15-20 mcg/ml Adjust zosyn for renal function as indicated  Plan:   Vancomycin 1500mg  IV x 1 now then 750mg  IV q12h  Check vancomycin level as steady state as  indicated  Zosyn 3.375gm IV q8h over 4h infusion  Doreene Eland, PharmD, BCPS.   Pager: [phone] 07/25/2013,7:33 AM

## 2013-07-25 NOTE — Progress Notes (Addendum)
Occupational Therapy Treatment Patient Details Name: Jordan Oconnell MRN: [id number] DOB: [birth year] Today's Date: 07/25/2013    History of present illness 73 yo male admitted with syncope, collapse. Hx of bladder/kidney cancer-surgery, HTN, Afib. Recent d/c from [facility] 07/12/13   OT comments    Follow Up Recommendations  SNF    Equipment Recommendations  None recommended by OT    Recommendations for Other Services      Precautions / Restrictions Precautions Precautions: Fall       Mobility Bed Mobility Overal bed mobility: Modified Independent HOB raised                Transfers       Sit to Stand: Min guard         General transfer comment: for safety.      Balance                                   ADL       Grooming: Oral care;Sitting;Brushing hair;Wash/dry face          Toilet transfer:  Min A, 3:1 commode, ambulation                       General ADL Comments: ambulated to bathroom with cane and sat on 3:1 commode to perform grooming.Goal is for standing, but pt requested to sit.  Pt also has an energy conservation goal, and sitting is in line with this.    Min A ambulating.  Pt reported feeling good and moved feet to dance with cane.  Pt disconnected catheter from bag.  Will rehook once bag is emptied:  asked NT to do this ASAP      Vision                     Perception     Praxis      Cognition   Behavior During Therapy: [facility] for tasks assessed/performed Overall Cognitive Status: Within Functional Limits for tasks assessed                       Extremity/Trunk Assessment               Exercises     Shoulder Instructions       General Comments      Pertinent Vitals/ Pain       No c/o pain  Home Living                                          Prior Functioning/Environment              Frequency       Progress Toward Goals  OT Goals(current goals can  now be found in the care plan section)  Progress towards OT goals: Progressing toward goals     Plan      Co-evaluation                 End of Session     Activity Tolerance Patient tolerated treatment well   Patient Left in chair;with call bell/phone within reach;with chair alarm set;with family/visitor present   Nurse Communication          Time: [id number] OT Time Calculation (min): 18 min  Charges:  OT General Charges $OT Visit: 1 Procedure OT Treatments $Self Care/Home Management : 8-22 mins  Michael Ventresca 07/25/2013, 10:11 AM Lesle Chris, OTR/L [phone] 07/25/2013

## 2013-07-25 NOTE — Consult Note (Signed)
Name: Jordan Oconnell MRN: [id number] DOB: Dec 13, 1940    ADMISSION DATE:  07/17/2013 CONSULTATION DATE:  07/25/2013  REFERRING MD :  Reynaldo Minium PRIMARY SERVICE:  IM  CHIEF COMPLAINT:  Hemoptysis and PNA.  BRIEF PATIENT DESCRIPTION: 73 year old male with extensive PMH presenting with frequent infections since February but primarily severe malnutrition driving these frequent infection.  Patient has been coughing quite vigorously since this recent diagnosis of PNA and has been on lovenox.  On the day of presentation to PCCM reported a large blood clot in his nose then subsequently some blood mixed in with sputum.  No frank blood and seems to be improving at this point.  "Hemoptysis" was after an endoscopy.  SIGNIFICANT EVENTS / STUDIES:  7/1 hemoptysis  LINES / TUBES: PIV  CULTURES: Blood 6/23>>>NTD Urine 6/23>>>>100K colonies with multiple bacterial morphotypes.  ANTIBIOTICS: Vancomycin Start date unknown>>> Zosyn start date unknown>>>  PAST MEDICAL HISTORY :  Past Medical History  Diagnosis Date  . Bladder cancer   . Hypertension   . GERD (gastroesophageal reflux disease)   . Renal cell carcinoma 2008    left  . BPH (benign prostatic hyperplasia)   . A-fib     with ventricular response  . Diverticulosis   . Colon polyps   . Hyperlipidemia     pt denies   Past Surgical History  Procedure Laterality Date  . Bladder surgery  2008    transurethral resection/resection of prostatic urethra  . Nephrectomy  2007    partial, left  . Nephrostomy  2011    stent  . Laparoscopic surgery  2012    laser   (?)  . US echocardiography  0000000    mild diastolic dysfunction,mild dilated LA,mild MR,AI,mildly dilated aortic root  . Nm myocar perf wall motion  09/08/2010    Normal  . Robot assisted laparoscopic complete cystect ileal conduit    . Appendectomy    . Bowel resection    . Esophagogastroduodenoscopy N/A 07/24/2013    Procedure: ESOPHAGOGASTRODUODENOSCOPY (EGD);   Surgeon: [name], MD;  Location: [name];  Service: Endoscopy;  Laterality: N/A;   Prior to Admission medications   Medication Sig Start Date End Date Taking? Authorizing Provider  ALPRAZolam (XANAX) 0.25 MG tablet Take 0.25 mg by mouth 3 (three) times daily as needed for anxiety.   Yes Historical Provider, MD  aspirin 81 MG tablet Take 162 mg by mouth every morning.    Yes Historical Provider, MD  chlorproMAZINE (THORAZINE) 25 MG tablet Take 25 mg by mouth 3 (three) times daily as needed for hiccoughs.   Yes Historical Provider, MD  diltiazem (TIAZAC) 180 MG 24 hr capsule Take 180 mg by mouth every morning.    Yes Historical Provider, MD  enoxaparin (LOVENOX) 40 MG/0.4ML injection Inject 40 mg into the skin every morning.    Yes Historical Provider, MD  escitalopram (LEXAPRO) 10 MG tablet Take 1 tablet by mouth every morning. 11/08/12  Yes Historical Provider, MD  flecainide (TAMBOCOR) 50 MG tablet Take 50 mg by mouth 2 (two) times daily.   Yes Historical Provider, MD  mirtazapine (REMERON) 7.5 MG tablet Take 7.5 mg by mouth at bedtime.   Yes Historical Provider, MD  senna (SENOKOT) 8.6 MG TABS tablet Take 2 tablets by mouth 2 (two) times daily.   Yes Historical Provider, MD  traMADol (ULTRAM) 50 MG tablet Take 50 mg by mouth every 6 (six) hours as needed for moderate pain.   Yes Historical Provider,  MD  feeding supplement, ENSURE COMPLETE, (ENSURE COMPLETE) LIQD Take 237 mLs by mouth 3 (three) times daily with meals. 07/22/13   Leanna Battles, MD  ferrous fumarate (HEMOCYTE - 106 MG FE) 325 (106 FE) MG TABS tablet Take 1 tablet (106 mg of iron total) by mouth 2 (two) times daily. 07/24/13   Leanna Battles, MD  metoCLOPramide (REGLAN) 5 MG/5ML solution Take 5 mLs (5 mg total) by mouth every morning. 07/24/13   Leanna Battles, MD  nystatin (MYCOSTATIN) 100000 UNIT/ML suspension Take 5 mLs (500,000 Units total) by mouth 4 (four) times daily. 07/24/13   Leanna Battles, MD  omeprazole  (PRILOSEC) 20 MG capsule Take 2 capsules (40 mg total) by mouth daily. 07/24/13   Leanna Battles, MD  polyethylene glycol [facility] / Floria Raveling) packet Take 17 g by mouth 2 (two) times daily as needed for mild constipation. 07/22/13   Leanna Battles, MD  vitamin C (ASCORBIC ACID) 500 MG tablet Take 1 tablet (500 mg total) by mouth 2 (two) times daily. 07/24/13   Leanna Battles, MD   Allergies  Allergen Reactions  . Doxycycline Hives  . Flomax [Tamsulosin Hcl] Other (See Comments)    Dizzy     FAMILY HISTORY:  Family History  Problem Relation Age of Onset  . Ovarian cancer Mother   . Colon cancer Mother   . Colon cancer Father   . Crohn's disease Son   . Breast cancer Daughter   . Colon cancer Brother    SOCIAL HISTORY:  reports that he has never smoked. He has never used smokeless tobacco. He reports that he drinks alcohol. He reports that he does not use illicit drugs.  REVIEW OF SYSTEMS:   Constitutional: Negative for fever, chills, weight loss, malaise/fatigue and diaphoresis.  HENT: Negative for hearing loss, ear pain, nosebleeds, congestion, sore throat, neck pain, tinnitus and ear discharge.   Eyes: Negative for blurred vision, double vision, photophobia, pain, discharge and redness.  Respiratory: Negative for cough, hemoptysis, sputum production, shortness of breath, wheezing and stridor.   Cardiovascular: Negative for chest pain, palpitations, orthopnea, claudication, leg swelling and PND.  Gastrointestinal: Negative for heartburn, nausea, vomiting, abdominal pain, diarrhea, constipation, blood in stool and melena.  Genitourinary: Negative for dysuria, urgency, frequency, hematuria and flank pain.  Musculoskeletal: Negative for myalgias, back pain, joint pain and falls.  Skin: Negative for itching and rash.  Neurological: Negative for dizziness, tingling, tremors, sensory change, speech change, focal weakness, seizures, loss of consciousness, weakness and headaches.    Endo/Heme/Allergies: Negative for environmental allergies and polydipsia. Does not bruise/bleed easily.  SUBJECTIVE:   VITAL SIGNS: Temp:  [97.6 F (36.4 C)-98.8 F (37.1 C)] 98.2 F (36.8 C) (07/01 1328) Pulse Rate:  [80-93] 89 (07/01 1328) Resp:  [18-20] 18 (07/01 1328) BP: (130-148)/(50-80) 135/68 mmHg (07/01 1328) SpO2:  [95 %-97 %] 96 % (07/01 1328)  PHYSICAL EXAMINATION: General:  Chronically ill appearing emaciated man, resting comfortably in exam bed. Neuro:  Alert and interactive, moving all ext to command. HEENT:  [place]/AT, PERRL, EOM-I and MMM Neck:  Supple, -LAN and -thyromegally. Cardiovascular:  RRR, Nl S1/S2, -M/R/G. Lungs:  Bibasilar crackles, R>L. Abdomen:  Soft, NT, ND and +BS.  Scars noted. Musculoskeletal:  -edema and -tenderness. Skin:  Intact but thin.   Recent Labs Lab 07/23/13 0748 07/24/13 0430 07/25/13 0001  NA 136* 137 138  K 4.6 4.9 4.5  CL 102 105 103  CO2 21 24 25   BUN 33* 27* 24*  CREATININE 1.06 0.97 0.94  GLUCOSE 118* 84 123*    Recent Labs Lab 07/23/13 0748 07/24/13 0430 07/24/13 1927 07/25/13 0001  HGB 8.0* 6.2* 9.0* 8.3*  HCT 25.0* 19.4* 28.3* 24.4*  WBC 22.0* 10.2  --  8.3  PLT PLATELET CLUMPS NOTED ON SMEAR, UNABLE TO ESTIMATE 181  --  182   Ct Abdomen Pelvis Wo Contrast  07/24/2013   CLINICAL DATA:  Bladder cancer, question hematoma. History of bladder cancer with several surgeries, recently with ileal conduit. Dropping hemoglobin. EGD on 07/24/2013 showed erosive esophagitis with ulceration and mucosal friability, with a moderate-sized hiatal hernia.  EXAM: CT ABDOMEN AND PELVIS WITHOUT CONTRAST  TECHNIQUE: Multidetector CT imaging of the abdomen and pelvis was performed following the standard protocol without IV contrast.  COMPARISON:  COMPARISON 03/26/2013  FINDINGS: Airspace opacities with some volume loss in the lingula. Right lower lobe airspace opacity may reflect pneumonia or pulmonary hemorrhage. Atelectasis in the left  lower lobe. Small left and trace right pleural effusions.  Coronary atherosclerosis.  Low-density blood pool favoring anemia.  Small type 1 hiatal hernia.  Small hypodense lesions in the left hepatic lobe.  Accounting for the motion artifact, the gallbladder appears unremarkable. Adrenal glands normal.  There is stent extending from the left collecting system, crossing the midline in the dilated left ureter to the right collecting system, which thin extends caudad and down through end ileostomy site. The functional ureters are dilated and may actually represent components of an ileal pouch.  Prior nephrostomy tubes have been removed. Small nonobstructive bilateral renal calculi are observed in the collecting systems.  Bilateral perirenal stranding is increased compared to prior. No pathologic upper abdominal adenopathy is observed. Aortoiliac atherosclerotic vascular disease.  No pathologic pelvic adenopathy is observed. Prior pelvic lymph node dissection noted.  No muscular compartment hematoma is identified. No ascites or unexpected hematoma in the abdomen or pelvis.  IMPRESSION: 1. No hematoma identified. Of note, there is airspace opacity in the right lower lobe which appears consolidated, and could represent pulmonary hemorrhage or pneumonia. 2. Scattered atelectasis in the left lung, with small left and trace right pleural effusions. 3. Dilated functional ureters, some of which may actually represent ileum, with left-sided crossing to the right in joining near the right UPJ, with a right sac connecting to an ileostomy. Left double-J ureteral stent extends over into the right collecting system 4. Ancillary findings include hypodense left hepatic lobe lesions; coronary atherosclerosis; low-density blood pool favoring anemia; increased bilateral perirenal stranding ; and small nonobstructive bilateral renal calculi.   Electronically Signed   By: Sherryl Barters M.D.   On: 07/24/2013 16:31    ASSESSMENT /  PLAN:  73 year old male with extensive recent surgical history with multiple infections noted.  Now with pneumonia and hemoptysis as well as severe malnutrition that I suspect is driving much of his physical deterioration.    Hemoptysis: I am less than concerned about the hemoptysis.  Would not recommend any bronchoscopy for now. Plan: - D/C anticoagulation and place SCD's.  - No bronchoscopy.  - If recurs then will reconsider depending on amount of blood.  - Will need a f/u chest CT in 6-8 wks.  Pneumonia: HCAP. Plan: - Agree with vanc and zosyn.  - Reculture.  - May consider involving ID given frequent infection.  - F/U CT as above to follow up.  Malnutrition: I suspect this is a major culprit here. Plan: - Recommend involving a dietitian.  - I spent >20 minutes talking to patient and  family about importance of nutrition as I suspect he will make no significant improvement without strong nutritional support.   Rush Farmer, M.D. [facility] Pulmonary/Critical Care Medicine. Pager: [phone]. After hours pager: [phone].  07/25/2013, 5:07 PM

## 2013-07-25 NOTE — Progress Notes (Addendum)
Physical Therapy Treatment Patient Details Name: Jordan Oconnell MRN: [id number] DOB: [birth year] Today's Date: 07/25/2013    History of Present Illness 73 yo male admitted with syncope, collapse. Hx of bladder/kidney cancer-surgery, HTN, Afib. Recent d/c from [facility] 07/12/13    PT Comments    *Pt ambulated 400' with cane today, min A for balance, distance limited by fatigue. **  Follow Up Recommendations  SNF;Home health PT;Supervision/Assistance - 24 hour (pt states he is undecided about d/c plan. May benfit from [place] rehab at SNF if assist available at home is questionable. )     Equipment Recommendations  None recommended by PT    Recommendations for Other Services OT consult     Precautions / Restrictions Precautions Precautions: Fall Restrictions Weight Bearing Restrictions: No    Mobility  Bed Mobility Overal bed mobility: Modified Independent             General bed mobility comments: with rail  Transfers Overall transfer level: Needs assistance Equipment used: Straight cane Transfers: Sit to/from Stand Sit to Stand: Min guard         General transfer comment: for safety.    Ambulation/Gait Ambulation/Gait assistance: Min assist;Min guard Ambulation Distance (Feet): 400 Feet Assistive device: Straight cane     Gait velocity interpretation: at or above normal speed for age/gender General Gait Details: pt wanted to try [facility] today, rather than RW. Min assist for mild LOB x 3, including one episode of scissoring. Pt fatigued after walking.   Stairs            Wheelchair Mobility    Modified Rankin (Stroke Patients Only)       Balance                                    Cognition Arousal/Alertness: Awake/alert Behavior During Therapy: WFL for tasks assessed/performed Overall Cognitive Status: Within Functional Limits for tasks assessed                      Exercises      General Comments        Pertinent  Vitals/Pain *0/10 pain**    Home Living                      Prior Function            PT Goals (current goals can now be found in the care plan section) Acute Rehab PT Goals Patient Stated Goal: regain strength, stamina PT Goal Formulation: With patient Time For Goal Achievement: 07/26/13 Potential to Achieve Goals: Good Progress towards PT goals: Progressing toward goals    Frequency  Min 3X/week    PT Plan Current plan remains appropriate    Co-evaluation             End of Session Equipment Utilized During Treatment: Gait belt Activity Tolerance: Patient limited by fatigue Patient left: in bed;with call bell/phone within reach;with family/visitor present (pt requested return to bed)     Time: 1247-1300 PT Time Calculation (min): 13 min  Charges:  $Gait Training: 8-22 mins                    G Codes:      Jordan Oconnell 07/25/2013, 1:09 PM [phone]

## 2013-07-26 ENCOUNTER — Inpatient Hospital Stay (HOSPITAL_COMMUNITY): Payer: Medicare Other

## 2013-07-26 ENCOUNTER — Telehealth: Payer: Self-pay | Admitting: *Deleted

## 2013-07-26 DIAGNOSIS — J189 Pneumonia, unspecified organism: Secondary | ICD-10-CM

## 2013-07-26 DIAGNOSIS — K209 Esophagitis, unspecified without bleeding: Secondary | ICD-10-CM | POA: Diagnosis present

## 2013-07-26 LAB — BASIC METABOLIC PANEL
Anion gap: 9 (ref 5–15)
BUN: 24 mg/dL — ABNORMAL HIGH (ref 6–23)
CHLORIDE: 102 meq/L (ref 96–112)
CO2: 27 mEq/L (ref 19–32)
Calcium: 8.2 mg/dL — ABNORMAL LOW (ref 8.4–10.5)
Creatinine, Ser: 0.92 mg/dL (ref 0.50–1.35)
GFR calc Af Amer: >90 mL/min (ref >90)
GFR, EST NON AFRICAN AMERICAN: 81 mL/min — AB (ref >90)
GLUCOSE: 93 mg/dL (ref 70–99)
Potassium: 4.4 mEq/L (ref 3.7–5.3)
SODIUM: 138 meq/L (ref 137–147)

## 2013-07-26 LAB — CBC
HEMATOCRIT: 26.8 % — AB (ref 39.0–52.0)
Hemoglobin: 8.8 g/dL — ABNORMAL LOW (ref 13.0–17.0)
MCH: 28.4 pg (ref 26.0–34.0)
MCHC: 32.8 g/dL (ref 30.0–36.0)
MCV: 86.5 fL (ref 78.0–100.0)
Platelets: 228 10*3/uL (ref 150–400)
RBC: 3.1 MIL/uL — ABNORMAL LOW (ref 4.22–5.81)
RDW: 16.2 % — AB (ref 11.5–15.5)
WBC: 9.7 10*3/uL (ref 4.0–10.5)

## 2013-07-26 MED ORDER — PIPERACILLIN-TAZOBACTAM 3.375 G IVPB: 3.3750 g | Freq: Three times a day (TID) | INTRAVENOUS | Status: DC

## 2013-07-26 MED ORDER — VANCOMYCIN HCL IN DEXTROSE 750-5 MG/150ML-% IV SOLN: 750.0000 mg | Freq: Two times a day (BID) | INTRAVENOUS | Status: DC

## 2013-07-26 MED ORDER — SODIUM CHLORIDE 0.9 % IJ SOLN
10.0000 mL | INTRAMUSCULAR | Status: DC | PRN
  Administered 2013-07-27: 10 mL

## 2013-07-26 MED ORDER — POTASSIUM CHLORIDE IN NACL 20-0.9 MEQ/L-% IV SOLN: 20.0000 mL/h | INTRAVENOUS | Status: DC

## 2013-07-26 NOTE — Consult Note (Signed)
PULMONARY / CRITICAL CARE MEDICINE   Name: Jordan Oconnell MRN: [id number] DOB: [birth year]    ADMISSION DATE:  07/17/2013 CONSULTATION DATE: 7/02  REFERRING MD : Reynaldo Minium  PRIMARY SERVICE: IM  CHIEF COMPLAINT: Hemoptysis and PNA.  BRIEF PATIENT DESCRIPTION: 73 year old male with h/o hypertension, bladder cancer, GERD, hyperlipidemia, atrial fibrillation and recent surgery in early June for bladder CA with ileal conduit formation who was admitted on 6/23 after a syncopal episode and weakness. Was found to have pneumonia on CT abd pelvis 6/30. He had an endoscopy on 6/30 and developed epistaxis and "hemoptysis" afterwards. PCCM consulted on 7/01 and there was no evidence of frank blood hemoptysis and patient seemed to be improving. PCCM following up 7/02 for diagnosis of pneumonia.   SIGNIFICANT EVENTS / STUDIES:  6/30 CT Abd pelvis >> right lower lobe opacity suggesting pneumonia 7/01 Hemoptysis associated with endoscopy and possible epistaxis 7/02 PCCM to follow-up on pneumonia  LINES / TUBES: PIVS  CULTURES: Blood 6/23>>>NTD  Urine 6/23>>>>100K colonies with multiple bacterial morphotypes.  ANTIBIOTICS: Vancomycin 7/1 >>> Zosyn 7/1 >>>  SUBJECTIVE/OVERNIGHT: Patient afebrile and with scant amount of blood tinged sputum. Denies CP or SOB, appears to be in NAD on RA.   VITAL SIGNS: Temp:  [97.8 F (36.6 C)-98.2 F (36.8 C)] 97.8 F (36.6 C) (07/02 0459) Pulse Rate:  [85-91] 85 (07/02 0459) Resp:  [18] 18 (07/02 0459) BP: (134-154)/(68-78) 154/78 mmHg (07/02 0459) SpO2:  [94 %-99 %] 99 % (07/02 0459)   INTAKE / OUTPUT: Intake/Output     07/01 0701 - 07/02 0700 07/02 0701 - 07/03 0700   P.O.     I.V. (mL/kg) 80 (1.2)    Blood     IV Piggyback 850    Total Intake(mL/kg) 930 (14)    Urine (mL/kg/hr) 4450 (2.8)    Total Output 4450     Net -3520           PHYSICAL EXAMINATION: General: Chronically ill appearing male in NAD Neuro: A/Ox4, pleasant affect, follows  commands, MAEs HEENT: PERRL, no blood noted in mouth or nares Cardiovascular: RRR, no murmurs Lungs: CTA Abdomen: Soft, NT, ND and +BS. Scars noted Musculoskeletal: -edema and -tenderness.  Skin: Intact, multiple bruises of varying healing on UEs from IVs and blood draws  CXR: 6/23 no active disease  ASSESSMENT / PLAN:  73 year old male with extensive recent surgical history with multiple infections noted. Now with pneumonia and severe malnutrition most likely driving much of his physical deterioration.   Pneumonia: HCAP, recent hospitalization, afebrile, improving leukocytosis, resolved hemoptysis.  Question whether true hemoptysis vs epistaxis that pt experienced following endoscopy 6/30 ? Plan:  - Repeat CXR today - Follow-up in office in 6 weeks with Dr. Lake Bells for progression of pneumonia and f/u Chest CT (to be scheduled). - Agree with vanc and zosyn and PICC line   Pulmonary Critical Care to sign off at this time, please re-consult as needed.   Lalla Brothers ACNP student    Montey Hora, [place] Pgr: [phone]  or [phone]  Attending:  I have seen and examined the patient with nurse practitioner/resident and agree with the note above.   Hemoptysis resolved, think this may have been epistaxis based on history Have arranged f/u CT 6 weeks with office visit He knows to call us sooner if hemoptysis returns Antibiotics per primary team  Roselie Awkward, MD [place] [facility] Pager:  [phone] Cell: [phone] If no response, call [phone]   07/26/2013, 10:20 AM

## 2013-07-26 NOTE — Progress Notes (Signed)
Subjective: Strength is improved and he has been able to walk short distances with assistance, no dyspnea or hiccups today.   Objective: Vital signs in last 24 hours: Temp:  [97.8 F (36.6 C)-98.2 F (36.8 C)] 97.8 F (36.6 C) (07/02 0459) Pulse Rate:  [85-91] 85 (07/02 0459) Resp:  [18] 18 (07/02 0459) BP: (134-154)/(68-78) 154/78 mmHg (07/02 0459) SpO2:  [94 %-99 %] 99 % (07/02 0459) Weight change:    Intake/Output from previous day: 07/01 0701 - 07/02 0700 In: 930 [I.V.:80; IV Piggyback:850] Out: 4450 [Urine:4450]   General appearance: alert, cooperative and no distress Resp: crackles lower 1/2 right lung Cardio: regular rate and rhythm GI: soft, non-tender; bowel sounds normal; no masses,  no organomegaly and urinary diversion stoma has a normal appearance Extremities: extremities normal, atraumatic, no cyanosis or edema  Lab Results:  Recent Labs  07/25/13 0001 07/26/13 0440  WBC 8.3 9.7  HGB 8.3* 8.8*  HCT 24.4* 26.8*  PLT 182 228   BMET  Recent Labs  07/25/13 0001 07/26/13 0440  NA 138 138  K 4.5 4.4  CL 103 102  CO2 25 27  GLUCOSE 123* 93  BUN 24* 24*  CREATININE 0.94 0.92  CALCIUM 7.9* 8.2*   CMET CMP     Component Value Date/Time   NA 138 07/26/2013 0440   K 4.4 07/26/2013 0440   CL 102 07/26/2013 0440   CO2 27 07/26/2013 0440   GLUCOSE 93 07/26/2013 0440   BUN 24* 07/26/2013 0440   CREATININE 0.92 07/26/2013 0440   CALCIUM 8.2* 07/26/2013 0440   PROT 4.4* 07/19/2013 0413   ALBUMIN 2.1* 07/19/2013 0413   AST 11 07/19/2013 0413   ALT 9 07/19/2013 0413   ALKPHOS 44 07/19/2013 0413   BILITOT <0.2* 07/19/2013 0413   GFRNONAA 81* 07/26/2013 0440   GFRAA >90 07/26/2013 0440    CBG (last 3)  No results found for this basename: GLUCAP,  in the last 72 hours  INR RESULTS:   Lab Results  Component Value Date   INR 1.00 09/04/2010     Studies/Results: Ct Abdomen Pelvis Wo Contrast  07/24/2013   CLINICAL DATA:  Bladder cancer, question hematoma. History of  bladder cancer with several surgeries, recently with ileal conduit. Dropping hemoglobin. EGD on 07/24/2013 showed erosive esophagitis with ulceration and mucosal friability, with a moderate-sized hiatal hernia.  EXAM: CT ABDOMEN AND PELVIS WITHOUT CONTRAST  TECHNIQUE: Multidetector CT imaging of the abdomen and pelvis was performed following the standard protocol without IV contrast.  COMPARISON:  COMPARISON 03/26/2013  FINDINGS: Airspace opacities with some volume loss in the lingula. Right lower lobe airspace opacity may reflect pneumonia or pulmonary hemorrhage. Atelectasis in the left lower lobe. Small left and trace right pleural effusions.  Coronary atherosclerosis.  Low-density blood pool favoring anemia.  Small type 1 hiatal hernia.  Small hypodense lesions in the left hepatic lobe.  Accounting for the motion artifact, the gallbladder appears unremarkable. Adrenal glands normal.  There is stent extending from the left collecting system, crossing the midline in the dilated left ureter to the right collecting system, which thin extends caudad and down through end ileostomy site. The functional ureters are dilated and may actually represent components of an ileal pouch.  Prior nephrostomy tubes have been removed. Small nonobstructive bilateral renal calculi are observed in the collecting systems.  Bilateral perirenal stranding is increased compared to prior. No pathologic upper abdominal adenopathy is observed. Aortoiliac atherosclerotic vascular disease.  No pathologic pelvic adenopathy is  observed. Prior pelvic lymph node dissection noted.  No muscular compartment hematoma is identified. No ascites or unexpected hematoma in the abdomen or pelvis.  IMPRESSION: 1. No hematoma identified. Of note, there is airspace opacity in the right lower lobe which appears consolidated, and could represent pulmonary hemorrhage or pneumonia. 2. Scattered atelectasis in the left lung, with small left and trace right pleural  effusions. 3. Dilated functional ureters, some of which may actually represent ileum, with left-sided crossing to the right in joining near the right UPJ, with a right sac connecting to an ileostomy. Left double-J ureteral stent extends over into the right collecting system 4. Ancillary findings include hypodense left hepatic lobe lesions; coronary atherosclerosis; low-density blood pool favoring anemia; increased bilateral perirenal stranding ; and small nonobstructive bilateral renal calculi.   Electronically Signed   By: Sherryl Barters M.D.   On: 07/24/2013 16:31    Medications: I have reviewed the patient's current medications.  Assessment/Plan: #1 Anemia: stabilized after transfusion #2 Pneumonia: stable and I appreciated pulmonary consultant evaluation. Will place PICC line today, then possibly transfer to a SNF for shortterm rehab and completion of IV antibiotics as early as tomorrow. #3 Protein calorie malnutrition: severe and improving with resolution of hiccups and improved food intake  LOS: 9 days   Kiel Cockerell G 07/26/2013, 7:35 AM

## 2013-07-26 NOTE — Telephone Encounter (Signed)
Message copied by Inge Rise on Thu Jul 26, 2013  5:16 PM ------      Message from: Simonne Maffucci B      Created: Thu Jul 26, 2013 10:09 AM       Please arrange f/u with me (elam) in 6 weeks with a noncontrast CT chest prior to that visit for f/u pneumonia      thanks ------

## 2013-07-26 NOTE — Progress Notes (Signed)
NUTRITION FOLLOW UP/CONSULT  Intervention:   -Continue to recommend Ensure Complete po TID, each supplement provides 350 kcal and 13 grams of protein -Reviewed nutrition therapy for high kcal/protein foods with pt's family -Provided pt and pt's family with supplement recipes -Family to continue to bring in outside foods pt can tolerate -Will continue to monitor    Nutrition Dx:   Inadequate oral intake related to decreased appetite as evidenced by decreased PO intake, 9.3% body weight loss in 3 months- improving   Goal:   Pt to meet >/= 90% of their estimated nutrition needs- improving   Monitor:   Total protein/energy intake, labs, weights, GI profile   Assessment:   6/24: -Pt reported decreased appetite since surgery in early 06/2013  -Has been tolerating small amounts of soft foods, and drinks Ensure Complete three times daily. Consuming water, orange juice and coffee for fluids at home. Will order to continue to supplement regimen and provide supplement coupons  -Endorsed unintentional wt loss, usual weight around 160-170 lbs. Provided pt with nutrition education handouts from the [employer]: "Increasing Calories and Protein Nutrition Therapy" and "High Protein, High Calorie Recipes"  -Appetite improving during admit, pt noted to be a "picky" eater but understood importance of adequate kcal/proteins. Appreciative of handout  -Pt also w/constipation, which is likely also contributing to sub-optimal intake. Receiving Miralax    6/25: -Received consult for further evaluation of nutritional status -Provided pt with nutritional supplement coupons for d/c needs -Pt reported fair appetite, has been eating 50-100% of meals. Drinking approximately 2 Ensure/day. Has also been consuming food from outside source brought in from family (milkshakes) -Continues with constipation. Reviewed intake of high fiber foods, fluids, and physical activity to assist with motility.  Also receiving docusate and mag citrate -No additional questions at this time, recommended to continue with nutrition supplement 2-3 times/daily upon d/c for nutrient replenishment  7/02: -Recommend new weight as pt without new since admit, and suspect improvement in weight status with increased intake -Pt reported tolerating 2-3 Ensure Complete daily. Provided pt with supplement recipes for additional ideas on incorporating high kcal/protein foods -Family has been bringing in food for pt since 7/01 as pt unable to tolerate hospital foods. Food log in pt's room indicate family providing variety of protein sources and starches. Encouraged pt to inform RN of outside meals being brought to pt for food safety purposes -Daughter expressed concern for diet upon d/c. Reviewed suggestions to improve taste, improve appetite, suggestions for incorporating high kcal/protein foods, and goal estimated intake  Height: Ht Readings from Last 1 Encounters:  07/17/13 6\' 1"  (1.854 m)    Weight Status:   Wt Readings from Last 1 Encounters:  07/17/13 145 lb 15.1 oz (66.2 kg)    Re-estimated needs using usual body weight of 155 lbs as pt w/out updated weight: Kcal: 2000-2200  Protein: 85-100 gram  Fluid: >/=2000 ml/daily  Skin: WDL  Diet Order: General   Intake/Output Summary (Last 24 hours) at 07/26/13 1428 Last data filed at 07/26/13 0549  Gross per 24 hour  Intake    930 ml  Output   3700 ml  Net  -2770 ml    Last BM: 6/30   Labs:   Recent Labs Lab 07/24/13 0430 07/25/13 0001 07/26/13 0440  NA 137 138 138  K 4.9 4.5 4.4  CL 105 103 102  CO2 24 25 27   BUN 27* 24* 24*  CREATININE 0.97 0.94 0.92  CALCIUM 7.7* 7.9* 8.2*  GLUCOSE 84 123* 93    CBG (last 3)  No results found for this basename: GLUCAP,  in the last 72 hours  Scheduled Meds: . alum & mag hydroxide-simeth  30 mL Oral TID  . diltiazem  180 mg Oral Daily  . docusate sodium  100 mg Oral BID  . escitalopram  10 mg  Oral q morning - 10a  . feeding supplement (ENSURE COMPLETE)  237 mL Oral TID WC  . flecainide  50 mg Oral BID  . magnesium citrate  1 Bottle Oral Once  . metoCLOPramide  5 mg Oral TID AC  . mirtazapine  7.5 mg Oral QHS  . nystatin  5 mL Oral QID  . pantoprazole  40 mg Oral BID  . piperacillin-tazobactam (ZOSYN)  IV  3.375 g Intravenous 3 times per day  . polyethylene glycol  17 g Oral Daily  . vancomycin  750 mg Intravenous Q12H    Continuous Infusions: . 0.9 % NaCl with KCl 20 mEq / L 125 mL/hr at 07/26/13 [place] MS RD LDN Clinical Dietitian [id number]

## 2013-07-26 NOTE — Telephone Encounter (Signed)
Order has been placed for CT. Called pt and LMTCB x1 to schedule OV.

## 2013-07-26 NOTE — Progress Notes (Signed)
Peripherally Inserted Central Catheter/Midline Placement  The IV Nurse has discussed with the patient and/or persons authorized to consent for the patient, the purpose of this procedure and the potential benefits and risks involved with this procedure.  The benefits include less needle sticks, lab draws from the catheter and patient may be discharged home with the catheter.  Risks include, but not limited to, infection, bleeding, blood clot (thrombus formation), and puncture of an artery; nerve damage and irregular heat beat.  Alternatives to this procedure were also discussed.  PICC/Midline Placement Documentation        Darlyn Read 07/26/2013, 6:38 PM

## 2013-07-26 NOTE — Progress Notes (Signed)
CSW spoke with patient & son at bedside re: discharge planning. Patient now agreeable with plan for SNF - hoping to go to [facility], patient & SNF to make resolution from a previous balance from stay in April. Patient to get a PICC line and receive IV antibiotics. CSW provided other SNF bed offers to patient & son incase [facility] is not an option.   CSW will follow-up in the morning.   Raynaldo Opitz, [facility] Clinical Social Worker cell #: [phone]

## 2013-07-26 NOTE — Progress Notes (Signed)
Assumed care of patient at 2300. Received report from Reid Hope King, Therapist, sports. Agree with previous assessment. Will continue to monitor. Blanchard Kelch, RN

## 2013-07-26 NOTE — Progress Notes (Signed)
Occupational Therapy Treatment Patient Details Name: Jordan Oconnell MRN: [id number] DOB: [birth year] Today's Date: 07/26/2013    History of present illness 73 yo male admitted with syncope, collapse. Hx of bladder/kidney cancer-surgery, HTN, Afib. Recent d/c from [facility] 07/12/13   OT comments  Pt able to verbalize 2 energy conservation techniques with OT today. Worked on functional transfers into the bathroom and pt sat on [facility] for grooming which he requested as one of the ways to conserve his energy.   Follow Up Recommendations  SNF;Supervision/Assistance - 24 hour    Equipment Recommendations  None recommended by OT    Recommendations for Other Services      Precautions / Restrictions Precautions Precautions: Fall       Mobility Bed Mobility Overal bed mobility: Modified Independent                Transfers   Equipment used: Straight cane Transfers: Sit to/from Stand Sit to Stand: Min guard         General transfer comment: tends to have weight shifted posteriorly and mostly on his heels.    Balance                                   ADL       Grooming: Oral care;Sitting;Wash/dry face;Set up                   Toilet Transfer: Minimal assistance;Ambulation;BSC Toilet Transfer Details (indicate cue type and reason): with use of cane to transfer onto [facility] to perform grooming           General ADL Comments: Pt able to verbalize 2 energy conservation techniques on his own. Min assist with cane for functional transfer into bathroom to [facility] to sit for grooming. Pt min guard to stand but a little unsteady with weight mostly on heels upon standing.       Vision                     Perception     Praxis      Cognition   Behavior During Therapy: WFL for tasks assessed/performed Overall Cognitive Status: Within Functional Limits for tasks assessed                       Extremity/Trunk Assessment                Exercises     Shoulder Instructions       General Comments      Pertinent Vitals/ Pain       No complaint of  Home Living                                          Prior Functioning/Environment              Frequency Min 2X/week     Progress Toward Goals  OT Goals(current goals can now be found in the care plan section)  Progress towards OT goals: Progressing toward goals     Plan Discharge plan remains appropriate    Co-evaluation                 End of Session Equipment Utilized During Treatment: Other (comment) (cane)   Activity Tolerance Patient tolerated treatment well  Patient Left in chair;with call bell/phone within reach;with chair alarm set   Nurse Communication          Time: [id number] OT Time Calculation (min): 18 min  Charges: OT General Charges $OT Visit: 1 Procedure OT Treatments $Therapeutic Activity: 8-22 mins  Jules Schick [id number] 07/26/2013, 11:22 AM

## 2013-07-27 LAB — BASIC METABOLIC PANEL
ANION GAP: 9 (ref 5–15)
BUN: 23 mg/dL (ref 6–23)
CO2: 25 meq/L (ref 19–32)
Calcium: 8.5 mg/dL (ref 8.4–10.5)
Chloride: 104 mEq/L (ref 96–112)
Creatinine, Ser: 0.91 mg/dL (ref 0.50–1.35)
GFR calc Af Amer: >90 mL/min (ref >90)
GFR calc non Af Amer: 81 mL/min — ABNORMAL LOW (ref >90)
GLUCOSE: 104 mg/dL — AB (ref 70–99)
Potassium: 4.7 mEq/L (ref 3.7–5.3)
SODIUM: 138 meq/L (ref 137–147)

## 2013-07-27 LAB — CBC
HCT: 30 % — ABNORMAL LOW (ref 39.0–52.0)
HEMOGLOBIN: 9.8 g/dL — AB (ref 13.0–17.0)
MCH: 28.4 pg (ref 26.0–34.0)
MCHC: 32.7 g/dL (ref 30.0–36.0)
MCV: 87 fL (ref 78.0–100.0)
PLATELETS: 270 10*3/uL (ref 150–400)
RBC: 3.45 MIL/uL — ABNORMAL LOW (ref 4.22–5.81)
RDW: 15.9 % — ABNORMAL HIGH (ref 11.5–15.5)
WBC: 10.4 10*3/uL (ref 4.0–10.5)

## 2013-07-27 LAB — VANCOMYCIN, TROUGH: VANCOMYCIN TR: 13.6 ug/mL (ref 10.0–20.0)

## 2013-07-27 MED ORDER — HEPARIN SOD (PORK) LOCK FLUSH 100 UNIT/ML IV SOLN
250.0000 [IU] | INTRAVENOUS | Status: AC | PRN
  Administered 2013-07-27: 250 [IU]

## 2013-07-27 NOTE — Progress Notes (Signed)
Physical Therapy Treatment Patient Details Name: Jordan Oconnell MRN: [id number] DOB: 1940/11/19 Today's Date: 07/27/2013    History of Present Illness 73 yo male admitted with syncope, collapse. Hx of bladder/kidney cancer-surgery, HTN, Afib. Recent d/c from [facility] 07/12/13    PT Comments    Pt feeling better.  Assisted OOB to amb in hallway then back to bed.  Pt demon limited activity tolerance and fatigues quickly.  Pt insisted he amb with cane vs walker which required increased assist due to decreased balance.  Follow Up Recommendations  SNF (pt has agreed to SNF)     Equipment Recommendations       Recommendations for Other Services       Precautions / Restrictions Precautions Precautions: Fall Restrictions Weight Bearing Restrictions: No    Mobility  Bed Mobility Overal bed mobility: Modified Independent                Transfers Overall transfer level: Needs assistance Equipment used: Straight cane   Sit to Stand: Min guard         General transfer comment: tends to have weight shifted posteriorly and mostly on his heels.  Ambulation/Gait Ambulation/Gait assistance: Min assist;Min guard Ambulation Distance (Feet): 245 Feet Assistive device: Straight cane Gait Pattern/deviations: Step-to pattern;Trunk flexed     General Gait Details: limited activity tolerance as pt fatigues quickly.  Mod unsteady with cane and pt declined to use RW.     Stairs            Wheelchair Mobility    Modified Rankin (Stroke Patients Only)       Balance                                    Cognition                            Exercises      General Comments        Pertinent Vitals/Pain     Home Living                      Prior Function            PT Goals (current goals can now be found in the care plan section) Progress towards PT goals: Progressing toward goals    Frequency  Min 3X/week    PT Plan       Co-evaluation             End of Session Equipment Utilized During Treatment: Gait belt Activity Tolerance: Patient limited by fatigue Patient left: in bed;with call bell/phone within reach;with family/visitor present     Time: 1002-1030 PT Time Calculation (min): 28 min  Charges:  $Gait Training: 8-22 mins $Therapeutic Activity: 8-22 mins                    G Codes:      Rica Koyanagi  PTA [facility]  Acute  Rehab Pager      [phone]

## 2013-07-27 NOTE — Progress Notes (Signed)
Report called to Lurline Hare LPN @ [facility].

## 2013-07-27 NOTE — Progress Notes (Addendum)
ANTIBIOTIC CONSULT NOTE - Follow-up  Pharmacy Consult for vancomycin/zosyn Indication: rule out pneumonia  Allergies  Allergen Reactions  . Doxycycline Hives  . Flomax [Tamsulosin Hcl] Other (See Comments)    Dizzy     Patient Measurements: Height: 6\' 1"  (185.4 cm) Weight: 145 lb 15.1 oz (66.2 kg) IBW/kg (Calculated) : 79.9 Adjusted Body Weight:   Vital Signs: Temp: 97.8 F (36.6 C) (07/03 0550) Temp src: Oral (07/03 0550) BP: 142/74 mmHg (07/03 0550) Pulse Rate: 88 (07/03 0550) Intake/Output from previous day: 07/02 0701 - 07/03 0700 In: 9375 [I.V.:8875; IV Piggyback:500] Out: [id number] [Urine:5150] Intake/Output from this shift: Total I/O In: -  Out: 900 [Urine:900]  Labs:  Recent Labs  07/25/13 0001 07/26/13 0440 07/27/13 0815  WBC 8.3 9.7 10.4  HGB 8.3* 8.8* 9.8*  PLT 182 228 270  CREATININE 0.94 0.92 0.91   Estimated Creatinine Clearance: 66.7 ml/min (by C-G formula based on Cr of 0.91).  Recent Labs  07/27/13 0815  [place] 13.6     Microbiology: Recent Results (from the past 720 hour(s))  CULTURE, BLOOD (ROUTINE X 2)     Status: None   Collection Time    07/17/13 11:43 AM      Result Value Ref Range Status   Specimen Description BLOOD LEFT FOREARM   Final   Special Requests BOTTLES DRAWN AEROBIC ONLY 1 CC   Final   Culture  Setup Time     Final   Value: 07/17/2013 14:57     Performed at [facility]   Culture     Final   Value: NO GROWTH 5 DAYS     Performed at [facility]   Report Status 07/23/2013 FINAL   Final  URINE CULTURE     Status: None   Collection Time    07/17/13 11:46 AM      Result Value Ref Range Status   Specimen Description URINE, CLEAN CATCH   Final   Special Requests NONE   Final   Culture  Setup Time     Final   Value: 07/17/2013 14:23     Performed at [facility]     Final   Value: >=100,000 COLONIES/ML     Performed at [facility]   Culture     Final   Value:  Multiple bacterial morphotypes present, none predominant. Suggest appropriate recollection if clinically indicated.     Performed at [facility]   Report Status 07/18/2013 FINAL   Final  CULTURE, BLOOD (ROUTINE X 2)     Status: None   Collection Time    07/17/13 12:37 PM      Result Value Ref Range Status   Specimen Description BLOOD   Final   Special Requests     Final   Value: BOTTLES DRAWN AEROBIC AND ANAEROBIC RIGHT FOOT 3.5 CC   Culture  Setup Time     Final   Value: 07/18/2013 07:52     Performed at [facility]   Culture     Final   Value: NO GROWTH 5 DAYS     Performed at [facility]   Report Status 07/24/2013 FINAL   Final    Medical History: Past Medical History  Diagnosis Date  . Bladder cancer   . Hypertension   . GERD (gastroesophageal reflux disease)   . Renal cell carcinoma 2008    left  . BPH (benign prostatic hyperplasia)   . A-fib  with ventricular response  . Diverticulosis   . Colon polyps   . Hyperlipidemia     pt denies   Assessment: 58 YOM admitted 6/23 with syncope (determined to be most likely from dehydration).  He has h/o urothelial carcinoma. CT from 6/30 revealed RLL airspace opacity possibly from pneumonia or pulmonary hemorrhage. EGD 6/30 = erosive esophagitis.  Starting antibiotics for possible pneumonia and consult pulmonary  7/1 >> vancomycin  >> 7/1 >> zosyn  >>    Tmax: afebrile WBCs: WNL Renal: SCr WNL, CrCL (N) = 104ml/min  6/23 blood: NG x 5 days 6/23 urine: >100K multiple morphologies  Drug level / dose changes info: 7/3: 0730 VT = 13.6 mcg/ml on 750mg  IV q12h (prior to 5th overall dose)  Goal of Therapy:  Vancomycin trough level 15-20 mcg/ml Adjust zosyn for renal function as indicated  Plan:  Day #3 vancomycin/zosyn  Continue Vancomycin 750mg  IV q12h, trough slightly subtherapeutic but anticipate concentrations will increase to low end goal with subsequent doses  Continue Zosyn 3.375gm IV  q8h over 4h infusion  Can be changed to 3.375gm IV q6h over 27min infusion at discharge, if wish to continue or change to cefepime 1gm IV q8h or 2gm IV q12h.  Doreene Eland, PharmD, BCPS.   Pager: [phone] 07/27/2013,9:31 AM

## 2013-07-27 NOTE — Progress Notes (Signed)
Patient is set to discharge to [facility] today. Patient & friend, [name] aware. Discharge packet given to RN, Arbie Cookey. Patient to transport to SNF via friend's car.   Clinical Social Work Department CLINICAL SOCIAL WORK PLACEMENT NOTE 07/27/2013  Patient:  Jordan Oconnell, Jordan Oconnell  Account Number:  [id number] Admit date:  07/17/2013  Clinical Social Worker:  Renold Genta  Date/time:  07/23/2013 03:17 PM  Clinical Social Work is seeking post-discharge placement for this patient at the following level of care:   SKILLED NURSING   (*CSW will update this form in Epic as items are completed)   07/23/2013  Patient/family provided with [facility] Department of Clinical Social Work's list of facilities offering this level of care within the geographic area requested by the patient (or if unable, by the patient's family).  07/23/2013  Patient/family informed of their freedom to choose among providers that offer the needed level of care, that participate in Medicare, Medicaid or managed care program needed by the patient, have an available bed and are willing to accept the patient.  07/23/2013  Patient/family informed of [facility]' ownership interest in [facility], as well as of the fact that they are under no obligation to receive care at this facility.  PASARR submitted to EDS on 07/23/2013 PASARR number received on 07/23/2013  FL2 transmitted to all facilities in geographic area requested by pt/family on  07/23/2013 FL2 transmitted to all facilities within larger geographic area on   Patient informed that his/her managed care company has contracts with or will negotiate with  certain facilities, including the following:     Patient/family informed of bed offers received:  07/23/2013 Patient chooses bed at [facility] Physician recommends and patient chooses bed at    Patient to be transferred to [facility] on   07/27/2013 Patient to be transferred to facility by friend, Rockie Neighbours car Patient and family notified of transfer on 07/27/2013 Name of family member notified:  patient's friend, [name] & son, Quentel Whary.  The following physician request were entered in Epic:   Additional Comments:   Raynaldo Opitz, [place] Social Worker cell #: [phone]

## 2013-07-27 NOTE — Progress Notes (Signed)
Subjective: Patient feels okay. No sob. Did cough up some what appears to be blood tinged mucous earlier this a.m. Which i have inspected.  Feels ready to go to snf.  Objective: Vital signs in last 24 hours: Temp:  [97.7 F (36.5 C)-97.9 F (36.6 C)] 97.8 F (36.6 C) (07/03 0550) Pulse Rate:  [88-98] 88 (07/03 0550) Resp:  [18] 18 (07/03 0550) BP: (123-142)/(69-74) 142/74 mmHg (07/03 0550) SpO2:  [95 %-98 %] 97 % (07/03 0550) Weight change:  Last BM Date: 07/24/13  Intake/Output from previous day: 07/02 0701 - 07/03 0700 In: 9375 [I.V.:8875; IV Piggyback:500] Out: 5150 [Urine:5150] Intake/Output this shift: Total I/O In: -  Out: 900 [Urine:900]  General appearance: alert, cooperative and appears stated age Resp: clear to auscultation bilaterally, decreased at the bases. Cardio: regular rate and rhythm, S1, S2 normal, no murmur, click, rub or gallop GI: soft, non-tender; bowel sounds normal; no masses,  no organomegaly Extremities: extremities normal, atraumatic, no cyanosis or edema Neurologic: Grossly normal   Lab Results:  Recent Labs  07/26/13 0440 07/27/13 0815  WBC 9.7 10.4  HGB 8.8* 9.8*  HCT 26.8* 30.0*  PLT 228 270   BMET  Recent Labs  07/26/13 0440 07/27/13 0815  NA 138 138  K 4.4 4.7  CL 102 104  CO2 27 25  GLUCOSE 93 104*  BUN 24* 23  CREATININE 0.92 0.91  CALCIUM 8.2* 8.5   CMET CMP     Component Value Date/Time   NA 138 07/27/2013 0815   K 4.7 07/27/2013 0815   CL 104 07/27/2013 0815   CO2 25 07/27/2013 0815   GLUCOSE 104* 07/27/2013 0815   BUN 23 07/27/2013 0815   CREATININE 0.91 07/27/2013 0815   CALCIUM 8.5 07/27/2013 0815   PROT 4.4* 07/19/2013 0413   ALBUMIN 2.1* 07/19/2013 0413   AST 11 07/19/2013 0413   ALT 9 07/19/2013 0413   ALKPHOS 44 07/19/2013 0413   BILITOT <0.2* 07/19/2013 0413   GFRNONAA 81* 07/27/2013 0815   GFRAA >90 07/27/2013 0815     Studies/Results: Dg Chest 2 View  07/26/2013   CLINICAL DATA:  Pneumonia  EXAM: CHEST  2 VIEW   COMPARISON:  Chest radiograph July 17, 2013; CT abdomen and pelvis which included lung bases July 24, 2013  FINDINGS: There is patchy infiltrate in both lung bases with significant partial clearing on the right and a lesser degree of partial clearing on the left compared to recent prior study. Elsewhere lungs are clear. Heart size and pulmonary vascularity are within normal limits. No adenopathy. There is degenerative change in the thoracic spine.  IMPRESSION: There is consolidation in both lung bases, more on the right than on the left. There is dense some clearing compared to recent prior study, although there still remains moderate infiltrate, particularly in the right lower lobe.   Electronically Signed   By: Lowella Grip M.D.   On: 07/26/2013 14:20    Medications: I have reviewed the patient's current medications.  Marland Kitchen alum & mag hydroxide-simeth  30 mL Oral TID  . diltiazem  180 mg Oral Daily  . docusate sodium  100 mg Oral BID  . escitalopram  10 mg Oral q morning - 10a  . feeding supplement (ENSURE COMPLETE)  237 mL Oral TID WC  . flecainide  50 mg Oral BID  . magnesium citrate  1 Bottle Oral Once  . metoCLOPramide  5 mg Oral TID AC  . mirtazapine  7.5 mg Oral QHS  .  nystatin  5 mL Oral QID  . pantoprazole  40 mg Oral BID  . piperacillin-tazobactam (ZOSYN)  IV  3.375 g Intravenous 3 times per day  . polyethylene glycol  17 g Oral Daily  . vancomycin  750 mg Intravenous Q12H     Assessment/Plan:  Principal Problem:   Syncope and collapse-resolved. Active Problems:   HTN (hypertension)-stable.   Atrial fibrillation-off anticoagulation for now.   Hyperkalemia-resolved.   Hyponatremia-resolved.   Bladder cancer-urostomy working well.   Dehydration-resolved.   Protein-calorie malnutrition, severe-continue to encourage nutritional intake.   Intractable hiccups-none now.   Anemia-moderate. Not worse today.   Pneumonia, organism unspecified-continue course of antibiotics, PICC  line.  No changes suggested to meds as they are listed in D/C summary by dr. Philip Aspen done yesterday. See this d/c summary for d/c meds and plan.   Hemoptysis, unspecified   Pneumonia-see above   Acute esophagitis-doubt the blood he is producing is from this.   LOS: 10 days   Jerlyn Ly, MD 07/27/2013, 10:04 AM

## 2013-07-31 NOTE — Telephone Encounter (Signed)
lmtcb to make OV after CT.  CT scheduled for 09/11/2013

## 2013-08-01 ENCOUNTER — Telehealth: Payer: Self-pay | Admitting: *Deleted

## 2013-08-01 DIAGNOSIS — C679 Malignant neoplasm of bladder, unspecified: Secondary | ICD-10-CM

## 2013-08-01 NOTE — Telephone Encounter (Signed)
Pt scheduled appt to see BQ 09/11/13

## 2013-08-01 NOTE — Addendum Note (Signed)
Addended by: Parke Poisson E on: 08/01/2013 04:55 PM   Modules accepted: Orders

## 2013-08-01 NOTE — Telephone Encounter (Signed)
Message copied by Rinaldo Ratel on Wed Aug 01, 2013  4:41 PM ------      Message from: Simonne Maffucci B      Created: Wed Aug 01, 2013  4:22 PM       Hi,            I am supposed to see this guy in 6 weeks.  His urologist from [facility] called me today asking that we add on a CT abdomen/pelvis with contrast when we do a CT of his chest at that time.  Indication: bladder and ureter cancer.  Please add this on to his current study.            Thanks      B ------

## 2013-08-01 NOTE — Telephone Encounter (Signed)
Called spoke with Rose at CT > pt is scheduled for his CT Chest on 8.18.15.  Per Rose, the CT Abd/pelvis can be done same day but an additional order will need to be placed. This has been done, Kalman Shan is aware.  Will sign and forward to BQ as FYI.

## 2013-08-01 NOTE — Telephone Encounter (Signed)
Rose w/ CT called again Pt will need a BMET for these tests.  She will call pt once she contacts [facility] to help with precertification. Order for BMET placed.

## 2013-08-27 ENCOUNTER — Other Ambulatory Visit: Payer: Self-pay | Admitting: Dermatology

## 2013-09-11 ENCOUNTER — Ambulatory Visit (INDEPENDENT_AMBULATORY_CARE_PROVIDER_SITE_OTHER)
Admission: RE | Admit: 2013-09-11 | Discharge: 2013-09-11 | Disposition: A | Discharge status: Home / Self Care | Payer: Medicare Other | Source: Ambulatory Visit | Attending: Pulmonary Disease | Admitting: Pulmonary Disease

## 2013-09-11 ENCOUNTER — Encounter: Payer: Self-pay | Admitting: Pulmonary Disease

## 2013-09-11 ENCOUNTER — Ambulatory Visit (INDEPENDENT_AMBULATORY_CARE_PROVIDER_SITE_OTHER): Payer: Medicare Other | Admitting: Pulmonary Disease

## 2013-09-11 VITALS — BP 124/66 | HR 89 | Ht 73.0 in | Wt 174.0 lb

## 2013-09-11 DIAGNOSIS — C671 Malignant neoplasm of dome of bladder: Secondary | ICD-10-CM

## 2013-09-11 DIAGNOSIS — R042 Hemoptysis: Secondary | ICD-10-CM

## 2013-09-11 DIAGNOSIS — C679 Malignant neoplasm of bladder, unspecified: Secondary | ICD-10-CM

## 2013-09-11 DIAGNOSIS — J189 Pneumonia, unspecified organism: Secondary | ICD-10-CM

## 2013-09-11 MED ORDER — IOHEXOL 300 MG/ML  SOLN
100.0000 mL | Freq: Once | INTRAMUSCULAR | Status: AC | PRN
  Administered 2013-09-11: 100 mL via INTRAVENOUS

## 2013-09-11 NOTE — Patient Instructions (Signed)
We will forward the CT report to Dr. [name] We will see you back on an as needed basis

## 2013-09-11 NOTE — Assessment & Plan Note (Signed)
Stable interval. We will forward the result of the CT Ab/pelvis to Dr. [name] who requested the study.

## 2013-09-11 NOTE — Assessment & Plan Note (Signed)
This has resolved.  From history, I think that this was only due to epistaxis.  Today's CT scan shows very slight scarring in the R lung related to his pneumonia, but this is not worrisome in appearance.  From my standpoint he can follow up with [place] on an as needed basis.

## 2013-09-11 NOTE — Progress Notes (Signed)
Subjective:    Patient ID: Jordan Oconnell, male    DOB: 1940/04/29, 73 y.o.   MRN: [id number]  Synopsis: Mr. Oppegard was seen in the hospital in 2015 for hemoptysis while he had HCAP after surgery for bladder cancer.  HPI  09/11/2013 ROV> MR. Lucker is here to seem again after his recent hospitalizeon.  He was discharged to a nursing home and he has gotten a lot stronger.  He is now exercising regularly.  He is actually walking 3 miles yesterday.  He walks in [place] often.  He has not coughed up blood but he has noted.  He has not had trouble breathing.  He has gained back about 25 pounds since his nadir low in the hospital.  Past Medical History  Diagnosis Date  . Bladder cancer   . Hypertension   . GERD (gastroesophageal reflux disease)   . Renal cell carcinoma 2008    left  . BPH (benign prostatic hyperplasia)   . A-fib     with ventricular response  . Diverticulosis   . Colon polyps   . Hyperlipidemia     pt denies     Review of Systems  Constitutional: Negative for fever, chills and fatigue.  HENT: Negative for postnasal drip, rhinorrhea and sinus pressure.   Respiratory: Negative for cough, shortness of breath and wheezing.   Cardiovascular: Negative for chest pain, palpitations and leg swelling.       Objective:   Physical Exam Filed Vitals:   09/11/13 1331  BP: 124/66  Pulse: 89  Height: 6\' 1"  (1.854 m)  Weight: 174 lb (78.926 kg)  SpO2: 100%   RA  Gen: well appearing, no acute distress HEENT: NCAT, EOMi, OP clear PULM: CTA B CV: RRR, no mgr, no JVD AB: BS+, soft, nontender, no hsm Ext: warm, no edema, no clubbing, no cyanosis Derm: no rash or skin breakdown Neuro: A&Ox4, MAEW  09/11/2013 CT chest > slight scar R base, other     Assessment & Plan:   Hemoptysis, unspecified This has resolved.  From history, I think that this was only due to epistaxis.  Today's CT scan shows very slight scarring in the R lung related to his pneumonia, but  this is not worrisome in appearance.  From my standpoint he can follow up with [place] on an as needed basis.  Bladder cancer Stable interval. We will forward the result of the CT Ab/pelvis to Dr. [name] who requested the study.    Updated Medication List Outpatient Encounter Prescriptions as of 09/11/2013  Medication Sig  . ALPRAZolam (XANAX) 0.25 MG tablet Take 0.25 mg by mouth 3 (three) times daily as needed for anxiety.  . baclofen (LIORESAL) 10 MG tablet Take 10 mg by mouth 2 (two) times daily.  . chlorproMAZINE (THORAZINE) 25 MG tablet Take 25 mg by mouth 3 (three) times daily as needed for hiccoughs.  . diltiazem (TIAZAC) 180 MG 24 hr capsule Take 180 mg by mouth every morning.   . escitalopram (LEXAPRO) 10 MG tablet Take 1 tablet by mouth every morning.  . feeding supplement, ENSURE COMPLETE, (ENSURE COMPLETE) LIQD Take 237 mLs by mouth 3 (three) times daily with meals.  . ferrous sulfate 325 (65 FE) MG tablet Take 325 mg by mouth 2 (two) times daily with a meal.  . flecainide (TAMBOCOR) 50 MG tablet Take 50 mg by mouth 2 (two) times daily.  . metoCLOPramide (REGLAN) 5 MG/5ML solution Take 5 mLs (5 mg total) by  mouth every morning.  . mirtazapine (REMERON) 7.5 MG tablet Take 7.5 mg by mouth at bedtime.  Marland Kitchen omeprazole (PRILOSEC) 20 MG capsule Take 2 capsules (40 mg total) by mouth daily.  Marland Kitchen senna (SENOKOT) 8.6 MG TABS tablet Take 2 tablets by mouth 2 (two) times daily.  . traMADol (ULTRAM) 50 MG tablet Take 50 mg by mouth every 6 (six) hours as needed for moderate pain.  . vitamin C (ASCORBIC ACID) 500 MG tablet Take 1 tablet (500 mg total) by mouth 2 (two) times daily.  Marland Kitchen nystatin (MYCOSTATIN) 100000 UNIT/ML suspension Take 5 mLs (500,000 Units total) by mouth 4 (four) times daily.  . [DISCONTINUED] ferrous fumarate (HEMOCYTE - 106 MG FE) 325 (106 FE) MG TABS tablet Take 1 tablet (106 mg of iron total) by mouth 2 (two) times daily.  . [DISCONTINUED] piperacillin-tazobactam (ZOSYN)  3.375 GM/50ML IVPB Inject 50 mLs (3.375 g total) into the vein every 8 (eight) hours.  . [DISCONTINUED] polyethylene glycol (MIRALAX / GLYCOLAX) packet Take 17 g by mouth 2 (two) times daily as needed for mild constipation.  . [DISCONTINUED] Potassium Chloride in NaCl (0.9 % NACL WITH KCL 20 MEQ / L) 20-0.9 MEQ/L-% Inject 20 mL/hr into the vein continuous.  . [DISCONTINUED] Vancomycin (VANCOCIN) 750 MG/150ML SOLN Inject 150 mLs (750 mg total) into the vein every 12 (twelve) hours.

## 2013-09-27 ENCOUNTER — Ambulatory Visit (INDEPENDENT_AMBULATORY_CARE_PROVIDER_SITE_OTHER): Payer: Medicare Other | Admitting: Cardiovascular Disease

## 2013-09-27 ENCOUNTER — Encounter: Payer: Self-pay | Admitting: Cardiovascular Disease

## 2013-09-27 VITALS — BP 144/86 | HR 89 | Resp 16 | Ht 73.0 in | Wt 176.0 lb

## 2013-09-27 DIAGNOSIS — I480 Paroxysmal atrial fibrillation: Secondary | ICD-10-CM

## 2013-09-27 DIAGNOSIS — I4891 Unspecified atrial fibrillation: Secondary | ICD-10-CM

## 2013-09-27 NOTE — Progress Notes (Signed)
Patient ID: Jordan Oconnell, male   DOB: 10/19/1940, 73 y.o.   MRN: [id number]     Reason for office visit Paroxysmal atrial fibrillation  Jordan Oconnell is recovering from multiple surgeries related to bladder cancer. He had initial urostomy performed in February, complicated by sepsis and requiring a one-month hospitalization fall but almost 2 months of rehabilitation. In June he had to undergo a second surgery for necrosis of the urostomy. Not long after returning home he had a syncopal event apparently related to dehydration and electrolyte abnormalities and he developed pneumonia. He was rehospitalized and then again went to rehabilitation until late July. His son and son's family have moved in with him. His son is a respiratory therapist and was actually present when Jordan Oconnell had a syncopal event.   Despite all the setbacks, he has returned to regular physical activity and he is now walking 3 miles a day at [facility]. After losing a lot of weight he has managed to gain most of it back..  As far as he knows during these hospitalizations he never had problems with atrial fibrillation. As not had any symptoms of rapid palpitations. He denies other cardiac complaints.  Allergies  Allergen Reactions  . Doxycycline Hives  . Flomax [Tamsulosin Hcl] Other (See Comments)    Dizzy     Current Outpatient Prescriptions  Medication Sig Dispense Refill  . ALPRAZolam (XANAX) 0.25 MG tablet Take 0.25 mg by mouth 3 (three) times daily as needed for anxiety.      . baclofen (LIORESAL) 10 MG tablet Take 10 mg by mouth 2 (two) times daily.      Marland Kitchen diltiazem (TIAZAC) 180 MG 24 hr capsule Take 180 mg by mouth every morning.       . escitalopram (LEXAPRO) 10 MG tablet Take 1 tablet by mouth every morning.      . feeding supplement, ENSURE COMPLETE, (ENSURE COMPLETE) LIQD Take 237 mLs by mouth 3 (three) times daily with meals.  60 Bottle  1  . ferrous sulfate 325 (65 FE) MG tablet Take 325 mg by mouth 2  (two) times daily with a meal.      . flecainide (TAMBOCOR) 50 MG tablet Take 50 mg by mouth 2 (two) times daily.      . mirtazapine (REMERON) 7.5 MG tablet Take 7.5 mg by mouth at bedtime.      Marland Kitchen omeprazole (PRILOSEC) 20 MG capsule Take 2 capsules (40 mg total) by mouth daily.  60 capsule  12  . senna (SENOKOT) 8.6 MG TABS tablet Take 2 tablets by mouth 2 (two) times daily.      . traMADol (ULTRAM) 50 MG tablet Take 50 mg by mouth every 6 (six) hours as needed for moderate pain.      . vitamin C (ASCORBIC ACID) 500 MG tablet Take 1 tablet (500 mg total) by mouth 2 (two) times daily.  100 tablet  2   No current facility-administered medications for this visit.    Past Medical History  Diagnosis Date  . Bladder cancer   . Hypertension   . GERD (gastroesophageal reflux disease)   . Renal cell carcinoma 2008    left  . BPH (benign prostatic hyperplasia)   . A-fib     with ventricular response  . Diverticulosis   . Colon polyps   . Hyperlipidemia     pt denies    Past Surgical History  Procedure Laterality Date  . Bladder surgery  2008  transurethral resection/resection of prostatic urethra  . Nephrectomy  2007    partial, left  . Nephrostomy  2011    stent  . Laparoscopic surgery  2012    laser   (?)  . US echocardiography  0000000    mild diastolic dysfunction,mild dilated LA,mild MR,AI,mildly dilated aortic root  . Nm myocar perf wall motion  09/08/2010    Normal  . Robot assisted laparoscopic complete cystect ileal conduit    . Appendectomy    . Bowel resection    . Esophagogastroduodenoscopy N/A 07/24/2013    Procedure: ESOPHAGOGASTRODUODENOSCOPY (EGD);  Surgeon: [name], MD;  Location: [name];  Service: Endoscopy;  Laterality: N/A;    Family History  Problem Relation Age of Onset  . Ovarian cancer Mother   . Colon cancer Mother   . Colon cancer Father   . Crohn's disease Son   . Breast cancer Daughter   . Colon cancer Brother     History    Social History  . Marital Status: Divorced    Spouse Name: N/A    Number of Children: 2  . Years of Education: N/A   Occupational History  . teacher    Social History Main Topics  . Smoking status: Never Smoker   . Smokeless tobacco: Never Used  . Alcohol Use: Yes     Comment: occasionally  . Drug Use: No  . Sexual Activity: Not on file   Other Topics Concern  . Not on file   Social History Narrative  . No narrative on file    Review of systems: The patient specifically denies any chest pain at rest or with exertion, dyspnea at rest or with exertion, orthopnea, paroxysmal nocturnal dyspnea, syncope, palpitations, focal neurological deficits, intermittent claudication, lower extremity edema, unexplained weight gain, cough, hemoptysis or wheezing.  The patient also denies abdominal pain, nausea, vomiting, dysphagia, diarrhea, constipation, polyuria, polydipsia, dysuria, hematuria, frequency, urgency, abnormal bleeding or bruising, fever, chills, unexpected weight changes, mood swings, change in skin or hair texture, change in voice quality, auditory or visual problems, allergic reactions or rashes, new musculoskeletal complaints other than usual "aches and pains".   PHYSICAL EXAM BP 144/86  Pulse 89  Resp 16  Ht 6\' 1"  (1.854 m)  Wt 176 lb (79.833 kg)  BMI 23.23 kg/m2  General: Alert, oriented x3, no distress Head: no evidence of trauma, PERRL, EOMI, no exophtalmos or lid lag, no myxedema, no xanthelasma; normal ears, nose and oropharynx Neck: normal jugular venous pulsations and no hepatojugular reflux; brisk carotid pulses without delay and no carotid bruits Chest: clear to auscultation, no signs of consolidation by percussion or palpation, normal fremitus, symmetrical and full respiratory excursions Cardiovascular: normal position and quality of the apical impulse, regular rhythm, normal first and second heart sounds, no murmurs, rubs or gallops Abdomen: no tenderness  or distention, no masses by palpation, no abnormal pulsatility or arterial bruits, normal bowel sounds, no hepatosplenomegaly. Urostomy Extremities: no clubbing, cyanosis or edema; 2+ radial, ulnar and brachial pulses bilaterally; 2+ right femoral, posterior tibial and dorsalis pedis pulses; 2+ left femoral, posterior tibial and dorsalis pedis pulses; no subclavian or femoral bruits Neurological: grossly nonfocal   EKG: NSR, LAFB  BMET    Component Value Date/Time   NA 138 07/27/2013 0815   K 4.7 07/27/2013 0815   CL 104 07/27/2013 0815   CO2 25 07/27/2013 0815   GLUCOSE 104* 07/27/2013 0815   BUN 23 07/27/2013 0815   CREATININE 0.91 07/27/2013 0815  CALCIUM 8.5 07/27/2013 0815   GFRNONAA 81* 07/27/2013 0815   GFRAA >90 07/27/2013 0815     ASSESSMENT AND PLAN  Atrial fibrillation, paroxysmal  No recent symptomatic recurrences. Note that he is over 58 years old and is taking a potentially pro arrhythmic agent. He is very active and has no symptoms of angina pectoris. He has undergone stress testing within the last couple of years with normal results. He has normal left ventricular systolic function and no significant valvular or myocardial problems. No evidence of proarrhythmia even when he had major electrolyte abnormalities and serious medical illness or His CHADS2Vasc2 score is intermediate. It is only slightly elevated secondary to hypertension.   Holli Humbles, MD, [place] [phone] office [phone] pager

## 2013-09-27 NOTE — Patient Instructions (Signed)
Dr. Croitoru recommends that you schedule a follow-up appointment in: 6 months    

## 2013-12-02 ENCOUNTER — Other Ambulatory Visit: Payer: Self-pay | Admitting: Cardiovascular Disease

## 2013-12-03 NOTE — Telephone Encounter (Signed)
E sent to pharmacy 

## 2014-03-22 ENCOUNTER — Telehealth: Payer: Self-pay | Admitting: Cardiovascular Disease

## 2014-03-27 ENCOUNTER — Other Ambulatory Visit: Payer: Self-pay | Admitting: Dermatology

## 2014-03-27 NOTE — Telephone Encounter (Signed)
Closed encounter °

## 2014-04-03 ENCOUNTER — Ambulatory Visit: Payer: Medicare Other | Admitting: Cardiovascular Disease

## 2014-05-13 ENCOUNTER — Ambulatory Visit (INDEPENDENT_AMBULATORY_CARE_PROVIDER_SITE_OTHER): Payer: Medicare Other | Admitting: Cardiovascular Disease

## 2014-05-13 ENCOUNTER — Encounter: Payer: Self-pay | Admitting: Cardiovascular Disease

## 2014-05-13 VITALS — BP 154/86 | HR 68 | Ht 73.0 in | Wt 213.4 lb

## 2014-05-13 DIAGNOSIS — I4891 Unspecified atrial fibrillation: Secondary | ICD-10-CM

## 2014-05-13 NOTE — Progress Notes (Signed)
Patient ID: ULYESS BOICE, male   DOB: Jul 24, 1940, 74 y.o.   MRN: [id number]      Cardiology Office Note   Date:  05/13/2014   ID:  Agron, Nicholes Sep 06, 1940, MRN [id number]  PCP:  [name], MD  Cardiologist:   Sanda Klein, MD   Chief Complaint  Patient presents with  . Follow-up    6 months:  No chest pain, SOB, edema or dizziness.  Weight has increased since last visit that he is concerned about.      History of Present Illness: LYNELL MANSOOR is a 74 y.o. male who presents for f/u for paroxysmal atrial fibrillation and mild HTN. Last year he had critical illness with multiorgan failure in the setting of sepsis. He has bounced back remarkably well from that. He is back to walking in [employer] 4 miles a day. He is slowly working back to his previous standard of more than 6 miles a day. He has gained back the weight he had lost and no longer appears malnourished. He has not had any bouts of atrial fibrillation or palpitations that he is aware of. He has not had bleeding problems. No syncope has occurred. He has developed a large abdominal incisional hernia in his right lower quadrant.  His son and daughter-in-law are planning to move back to [place] soon. He is moving out of this large home into a smaller one.    Past Medical History  Diagnosis Date  . Bladder cancer   . Hypertension   . GERD (gastroesophageal reflux disease)   . Renal cell carcinoma 2008    left  . BPH (benign prostatic hyperplasia)   . A-fib     with ventricular response  . Diverticulosis   . Colon polyps   . Hyperlipidemia     pt denies    Past Surgical History  Procedure Laterality Date  . Bladder surgery  2008    transurethral resection/resection of prostatic urethra  . Nephrectomy  2007    partial, left  . Nephrostomy  2011    stent  . Laparoscopic surgery  2012    laser   (?)  . US echocardiography  0000000    mild diastolic dysfunction,mild dilated LA,mild  MR,AI,mildly dilated aortic root  . Nm myocar perf wall motion  09/08/2010    Normal  . Robot assisted laparoscopic complete cystect ileal conduit    . Appendectomy    . Bowel resection    . Esophagogastroduodenoscopy N/A 07/24/2013    Procedure: ESOPHAGOGASTRODUODENOSCOPY (EGD);  Surgeon: [name], MD;  Location: [name];  Service: Endoscopy;  Laterality: N/A;     Current Outpatient Prescriptions  Medication Sig Dispense Refill  . ALPRAZolam (XANAX) 0.25 MG tablet Take 0.25 mg by mouth 3 (three) times daily as needed for anxiety.    . baclofen (LIORESAL) 10 MG tablet Take 10 mg by mouth 2 (two) times daily.    Marland Kitchen diltiazem (TIAZAC) 180 MG 24 hr capsule Take 180 mg by mouth every morning.     . escitalopram (LEXAPRO) 10 MG tablet Take 1 tablet by mouth every morning.    . ferrous sulfate 325 (65 FE) MG tablet Take 325 mg by mouth 2 (two) times daily with a meal.    . flecainide (TAMBOCOR) 50 MG tablet TAKE ONE TABLET BY MOUTH TWICE DAILY 60 tablet 6  . mirtazapine (REMERON) 7.5 MG tablet Take 7.5 mg by mouth at bedtime.    Marland Kitchen omeprazole (PRILOSEC) 20 MG  capsule Take 2 capsules (40 mg total) by mouth daily. 60 capsule 12  . senna (SENOKOT) 8.6 MG TABS tablet Take 2 tablets by mouth 2 (two) times daily.    . traMADol (ULTRAM) 50 MG tablet Take 50 mg by mouth every 6 (six) hours as needed for moderate pain.    . vitamin C (ASCORBIC ACID) 500 MG tablet Take 1 tablet (500 mg total) by mouth 2 (two) times daily. 100 tablet 2   No current facility-administered medications for this visit.    Allergies:   Doxycycline and Flomax    Social History:  The patient  reports that he has never smoked. He has never used smokeless tobacco. He reports that he drinks alcohol. He reports that he does not use illicit drugs.   Family History:  The patient's family history includes Breast cancer in his daughter; Colon cancer in his brother, father, and mother; Crohn's disease in his son; Ovarian cancer  in his mother.    ROS:  Please see the history of present illness.    The patient specifically denies any chest pain at rest or with exertion, dyspnea at rest or with exertion, orthopnea, paroxysmal nocturnal dyspnea, syncope, palpitations, focal neurological deficits, intermittent claudication, lower extremity edema, unexplained weight gain, cough, hemoptysis or wheezing.  The patient also denies abdominal pain, nausea, vomiting, dysphagia, diarrhea, constipation, polyuria, polydipsia, dysuria, hematuria, frequency, urgency, abnormal bleeding or bruising, fever, chills, unexpected weight changes, mood swings, change in skin or hair texture, change in voice quality, auditory or visual problems, allergic reactions or rashes, new musculoskeletal complaints other than usual "aches and pains".   All other systems are reviewed and negative.    PHYSICAL EXAM: VS:  BP 154/86 mmHg  Pulse 68  Ht 6\' 1"  (1.854 m)  Wt 213 lb 6.4 oz (96.798 kg)  BMI 28.16 kg/m2 , BMI Body mass index is 28.16 kg/(m^2).  General: Alert, oriented x3, no distress Head: no evidence of trauma, PERRL, EOMI, no exophtalmos or lid lag, no myxedema, no xanthelasma; normal ears, nose and oropharynx Neck: normal jugular venous pulsations and no hepatojugular reflux; brisk carotid pulses without delay and no carotid bruits Chest: clear to auscultation, no signs of consolidation by percussion or palpation, normal fremitus, symmetrical and full respiratory excursions Cardiovascular: normal position and quality of the apical impulse, regular rhythm, normal first and second heart sounds, no murmurs, rubs or gallops Abdomen: large nonincarcerated LLQ incisional hernia, urostomy RLQ, no masses by palpation, no abnormal pulsatility or arterial bruits, normal bowel sounds, no hepatosplenomegaly Extremities: no clubbing, cyanosis or edema; 2+ radial, ulnar and brachial pulses bilaterally; 2+ right femoral, posterior tibial and dorsalis pedis  pulses; 2+ left femoral, posterior tibial and dorsalis pedis pulses; no subclavian or femoral bruits Neurological: grossly nonfocal Psych: euthymic mood, full affect   EKG:  EKG is ordered today. The ekg ordered today demonstrates NSR, left axis/ LAFB, QRS 222 ms, QTc 480 ms   Recent Labs: 07/19/2013: ALT 9 07/27/2013: BUN 23; Creatinine 0.91; Hemoglobin 9.8*; Platelets 270; Potassium 4.7; Sodium 138    Lipid Panel No results found for: CHOL, TRIG, HDL, CHOLHDL, VLDL, LDLCALC, LDLDIRECT    Wt Readings from Last 3 Encounters:  05/13/14 213 lb 6.4 oz (96.798 kg)  09/27/13 176 lb (79.833 kg)  09/11/13 174 lb (78.926 kg)      ASSESSMENT AND PLAN:  Mr. Gailes has recovered well from his episode of critical illness/sepsis and has gained back the weight he has lost. He looks much  healthier and fitter. In fact he is moderately overweight. He is walking 4 miles a day and has no cardiac symptoms. He has not been aware of atrial fibrillation/palpitations. He has not had syncope, angina or exertional dyspnea.  His estimated embolic risk is low, mostly conferred by his age. The risk of bleeding is felt to be substantially higher than in the average patient since he has active problems with urological cancer and has had hematuria. He will continue taking aspirin for embolism prevention.   CHADSVasc score is now 3 - he just turned 74 a couple of weeks ago. Consider switching to anticoagulants in the future if the bleeding risk is felt to be lower/stable neoplasm.   Current medicines are reviewed at length with the patient today.  The patient does not have concerns regarding medicines.  The following changes have been made:  no change  Labs/ tests ordered today include:  Orders Placed This Encounter  Procedures  . EKG 12-Lead    Patient Instructions  Dr. Sallyanne Kuster recommends that you schedule a follow-up appointment in: 6 months.       Mikael Spray, MD  05/13/2014 12:36 PM     Sanda Klein, MD, [facility] [phone] office [phone] pager

## 2014-05-13 NOTE — Patient Instructions (Signed)
Dr. Croitoru recommends that you schedule a follow-up appointment in: 6 months    

## 2014-07-05 ENCOUNTER — Other Ambulatory Visit: Payer: Self-pay | Admitting: Cardiovascular Disease

## 2014-09-26 ENCOUNTER — Encounter: Payer: Self-pay | Admitting: Internal Medicine

## 2014-11-11 ENCOUNTER — Other Ambulatory Visit: Payer: Self-pay | Admitting: Cardiovascular Disease

## 2014-12-06 ENCOUNTER — Encounter: Payer: Self-pay | Admitting: Cardiovascular Disease

## 2014-12-06 ENCOUNTER — Ambulatory Visit (INDEPENDENT_AMBULATORY_CARE_PROVIDER_SITE_OTHER): Payer: Medicare Other | Admitting: Cardiovascular Disease

## 2014-12-06 VITALS — BP 146/90 | HR 66 | Ht 73.0 in | Wt 217.6 lb

## 2014-12-06 DIAGNOSIS — I10 Essential (primary) hypertension: Secondary | ICD-10-CM | POA: Diagnosis not present

## 2014-12-06 DIAGNOSIS — I480 Paroxysmal atrial fibrillation: Secondary | ICD-10-CM

## 2014-12-06 MED ORDER — FLECAINIDE ACETATE 50 MG PO TABS: 25.0000 mg | ORAL_TABLET | Freq: Two times a day (BID) | ORAL | Status: DC

## 2014-12-06 NOTE — Patient Instructions (Signed)
Your physician has recommended you make the following change in your medication: DECREASE FLECAINIDE TO 50 MG TABLETS - TAKE 1/2 TABLET TWICE A DAY.  Dr. Sallyanne Kuster recommends that you schedule a follow-up appointment in: [place]

## 2014-12-06 NOTE — Progress Notes (Signed)
Patient ID: Jordan Oconnell, male   DOB: [birth year], 74 y.o.   MRN: [id number]     Cardiology Office Note   Date:  12/07/2014   ID:  Jordan Oconnell Feb 25, 1940, MRN [id number]  PCP:  [name], MD  Cardiologist:   Sanda Klein, MD   Chief Complaint  Patient presents with  . Follow-up    no chest pain, no shortness of breath, no edema, no pain in legs, occassional cramping in legs, no lightheadedness, no dizziness      History of Present Illness: Jordan Oconnell is a 74 y.o. male who presents for  Follow-up for paroxysmal atrial fibrillation. He is doing better than his last appointment. He has gained some weight, appropriately. In fact he is now a little bit overweight. His urostomy is functioning well. 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 He works 3 hours a day. This has made a big difference in his morale.  He continues to walk 4 miles a day 3-5 days a week. He sees Dr. [name] at [facility] every 6 months for metastatic bladder cancer, bilateral ureteral cancer and kidney cancer, s/p  Cystectomy and bilateral ureterectomy in 2015.  He has a very large abdominal incisional hernia in the right lower quadrant , but this is not really producing untoward symptoms although he finds it unseemly at times.  He has not had any palpitations.  No further hematuria.  He has occasional tingling in his feet sounds neuropathic. Denies exertional dyspnea, chest pain, syncope , claudication, leg edema.  Past Medical History  Diagnosis Date  . Bladder cancer ([place])   . Hypertension   . GERD (gastroesophageal reflux disease)   . Renal cell carcinoma ([place]) 2008    left  . BPH (benign prostatic hyperplasia)   . A-fib ([place])     with ventricular response  . Diverticulosis   . Colon polyps   . Hyperlipidemia     pt denies    Past Surgical History  Procedure Laterality Date  . Bladder surgery  2008    transurethral  resection/resection of prostatic urethra  . Nephrectomy  2007    partial, left  . Nephrostomy  2011    stent  . Laparoscopic surgery  2012    laser   (?)  . US echocardiography  0000000    mild diastolic dysfunction,mild dilated LA,mild MR,AI,mildly dilated aortic root  . Nm myocar perf wall motion  09/08/2010    Normal  . Robot assisted laparoscopic complete cystect ileal conduit    . Appendectomy    . Bowel resection    . Esophagogastroduodenoscopy N/A 07/24/2013    Procedure: ESOPHAGOGASTRODUODENOSCOPY (EGD);  Surgeon: [name], MD;  Location: [name];  Service: Endoscopy;  Laterality: N/A;     Current Outpatient Prescriptions  Medication Sig Dispense Refill  . ALPRAZolam (XANAX) 0.25 MG tablet Take 0.25 mg by mouth 3 (three) times daily as needed for anxiety.    Marland Kitchen diltiazem (TIAZAC) 180 MG 24 hr capsule Take 180 mg by mouth every morning.     . escitalopram (LEXAPRO) 10 MG tablet Take 1 tablet by mouth every morning.    . flecainide (TAMBOCOR) 50 MG tablet Take 0.5 tablets (25 mg total) by mouth 2 (two) times daily. 30 tablet 11  . mirtazapine (REMERON) 7.5 MG tablet Take 7.5 mg by mouth at bedtime.    Marland Kitchen omeprazole (PRILOSEC) 20 MG capsule Take 2 capsules (40  mg total) by mouth daily. 60 capsule 12   No current facility-administered medications for this visit.    Allergies:   Doxycycline and Flomax    Social History:  The patient  reports that he has never smoked. He has never used smokeless tobacco. He reports that he drinks alcohol. He reports that he does not use illicit drugs.   Family History:  The patient's family history includes Breast cancer in his daughter; Colon cancer in his brother, father, and mother; Crohn's disease in his son; Ovarian cancer in his mother.    ROS:  Please see the history of present illness.    Otherwise, review of systems positive for none.   All other systems are reviewed and negative.    PHYSICAL EXAM: VS:  BP 146/90 mmHg   Pulse 66  Ht 6\' 1"  (1.854 m)  Wt 217 lb 9 oz (98.686 kg)  BMI 28.71 kg/m2 , BMI Body mass index is 28.71 kg/(m^2).  General: Alert, oriented x3, no distress Head: no evidence of trauma, PERRL, EOMI, no exophtalmos or lid lag, no myxedema, no xanthelasma; normal ears, nose and oropharynx Neck: normal jugular venous pulsations and no hepatojugular reflux; brisk carotid pulses without delay and no carotid bruits Chest: clear to auscultation, no signs of consolidation by percussion or palpation, normal fremitus, symmetrical and full respiratory excursions Cardiovascular: normal position and quality of the apical impulse, regular rhythm, normal first and second heart sounds, no murmurs, rubs or gallops Abdomen: no tenderness or distention, no masses by palpation, no abnormal pulsatility or arterial bruits, normal bowel sounds, no hepatosplenomegaly.  Large hernia in the right lower quadrant in the area of a previous surgical scar Extremities: no clubbing, cyanosis or edema; 2+ radial, ulnar and brachial pulses bilaterally; 2+ right femoral, posterior tibial and dorsalis pedis pulses; 2+ left femoral, posterior tibial and dorsalis pedis pulses; no subclavian or femoral bruits Neurological: grossly nonfocal Psych: euthymic mood, full affect   LABS:  hemoglobin 14.7,  Creatinine 1.6 , BUN 41 on July 27  EKG:  EKG is ordered today. The ekg ordered today demonstrates  Normal sinus rhythm, left anterior fascicular block   Wt Readings from Last 3 Encounters:  12/06/14 217 lb 9 oz (98.686 kg)  05/13/14 213 lb 6.4 oz (96.798 kg)  09/27/13 176 lb (79.833 kg)     ASSESSMENT AND PLAN:  1.  Paroxysmal atrial fibrillation with very infrequent events. Try to reduce his flecainide to 25 mg twice a day. His embolic risk is at least intermediate since he is now 74 years old. The diagnosis of hypertension is a little questionable. CHADS-Vasc score is 2-3. He is not on anticoagulation due to problems with  hematuria and Hemoccult positive stools , but these appear to have stabilized.  I think aspirin 81 mg daily as a good compromise for now , but I'm reluctant to recommend starting anticoagulants.  2.  Systolic hypertension , often situational. More recently his systolic blood pressure has been consistently over 140 and this may be related to his weight gain.  No change in meds recommended. Try to lose a little weight.  Might need to increase the diltiazem dose  3.  Widespread urothelial cancer , followed in the oncology clinic at [facility] by Dr. [name]  Current medicines are reviewed at length with the patient today.  The patient does not have concerns regarding medicines.   Labs/ tests ordered today include:  Orders Placed This Encounter  Procedures  . EKG 12-Lead  Patient Instructions  Your physician has recommended you make the following change in your medication: DECREASE FLECAINIDE TO 50 MG TABLETS - TAKE 1/2 TABLET TWICE A DAY.  Dr. Sallyanne Kuster recommends that you schedule a follow-up appointment in: ONE YEAR        SignedSanda Klein, MD  12/07/2014 9:21 AM    Sanda Klein, MD, [facility] [phone] office [phone] pager

## 2015-12-19 ENCOUNTER — Other Ambulatory Visit: Payer: Self-pay | Admitting: Cardiovascular Disease

## 2015-12-22 NOTE — Telephone Encounter (Signed)
Rx request sent to pharmacy.  

## 2016-01-30 ENCOUNTER — Encounter: Payer: Self-pay | Admitting: Cardiovascular Disease

## 2016-01-30 ENCOUNTER — Ambulatory Visit (INDEPENDENT_AMBULATORY_CARE_PROVIDER_SITE_OTHER): Payer: Medicare Other | Admitting: Cardiovascular Disease

## 2016-01-30 VITALS — BP 140/84 | HR 77 | Ht 73.0 in | Wt 220.0 lb

## 2016-01-30 DIAGNOSIS — I4891 Unspecified atrial fibrillation: Secondary | ICD-10-CM

## 2016-01-30 DIAGNOSIS — R030 Elevated blood-pressure reading, without diagnosis of hypertension: Secondary | ICD-10-CM

## 2016-01-30 DIAGNOSIS — C689 Malignant neoplasm of urinary organ, unspecified: Secondary | ICD-10-CM

## 2016-01-30 NOTE — Patient Instructions (Signed)
Dr Croitoru recommends that you schedule a follow-up appointment in 1 year. You will receive a reminder letter in the mail two months in advance. If you don't receive a letter, please call our office to schedule the follow-up appointment.  If you need a refill on your cardiac medications before your next appointment, please call your pharmacy. 

## 2016-01-30 NOTE — Progress Notes (Signed)
Cardiology Office Note    Date:  01/30/2016   ID:  Jordan Oconnell, DOB Jan 19, 1941, MRN [id number]  PCP:  Jordan Lyons, MD  Cardiologist:   Jordan Klein, MD   Chief Complaint  Patient presents with  . Follow-up    History of Present Illness:  Jordan Oconnell is a 76 y.o. male with paroxysmal atrial fibrillation and multiple sites of urothelial cancer involving his bladder ureters and renal pelvises bilaterally (history of partial left nephrectomy and complete cystectomy with urostomy. He last saw his urologist, Dr. Lorine Oconnell in September and received a generally good report, but still had one suspicious spot on his scans. He'll be going back to see his urologist later this month.  Jordan Oconnell remains very active and socially engaged. 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 He walks on a daily basis. He has not had any of the chest heaviness that seemed to be his only symptom of atrial fibrillation when initially diagnosed. He has not been aware of palpitations or irregularities in his heartbeat. He denies dyspnea, leg edema or exertional angina. He has not had any recent hematuria or other bleeding problems. He has not been able to take anticoagulation for atrial fibrillation due to recurrent episodes of urinary bleeding. He has never had a stroke or TIA.  Past Medical History:  Diagnosis Date  . A-fib ([place])    with ventricular response  . Bladder cancer ([place])   . BPH (benign prostatic hyperplasia)   . Colon polyps   . Diverticulosis   . GERD (gastroesophageal reflux disease)   . Hyperlipidemia    pt denies  . Hypertension   . Renal cell carcinoma ([place]) 2008   left    Past Surgical History:  Procedure Laterality Date  . APPENDECTOMY    . BLADDER SURGERY  2008   transurethral resection/resection of prostatic urethra  . BOWEL RESECTION    . ESOPHAGOGASTRODUODENOSCOPY N/A 07/24/2013   Procedure: ESOPHAGOGASTRODUODENOSCOPY (EGD);  Surgeon: Jordan Shipper, MD;  Location: [name];  Service: Endoscopy;  Laterality: N/A;  . laparoscopic surgery  2012   laser   (?)  . NEPHRECTOMY  2007   partial, left  . NEPHROSTOMY  2011   stent  . NM MYOCAR PERF WALL MOTION  09/08/2010   Normal  . ROBOT ASSISTED LAPAROSCOPIC COMPLETE CYSTECT ILEAL CONDUIT    . US ECHOCARDIOGRAPHY  4/49/6759   mild diastolic dysfunction,mild dilated LA,mild MR,AI,mildly dilated aortic root    Current Medications: Outpatient Medications Prior to Visit  Medication Sig Dispense Refill  . ALPRAZolam (XANAX) 0.25 MG tablet Take 0.25 mg by mouth at bedtime as needed for anxiety.     Marland Kitchen diltiazem (TIAZAC) 180 MG 24 hr capsule Take 180 mg by mouth every morning.     . escitalopram (LEXAPRO) 10 MG tablet Take 1 tablet by mouth every morning.    . flecainide (TAMBOCOR) 50 MG tablet TAKE ONE-HALF TABLET BY MOUTH TWICE DAILY 90 tablet 1  . mirtazapine (REMERON) 7.5 MG tablet Take 7.5 mg by mouth at bedtime.    Marland Kitchen omeprazole (PRILOSEC) 20 MG capsule Take 2 capsules (40 mg total) by mouth daily. (Patient taking differently: Take 20 mg by mouth daily. ) 60 capsule 12   No facility-administered medications prior to visit.      Allergies:   Doxycycline and Flomax [tamsulosin hcl]   Social History   Social History  . Marital  status: Divorced    Spouse name: N/A  . Number of children: 2  . Years of education: N/A   Occupational History  . teacher    Social History Main Topics  . Smoking status: Never Smoker  . Smokeless tobacco: Never Used  . Alcohol use Yes     Comment: occasionally  . Drug use: No  . Sexual activity: Not Asked   Other Topics Concern  . None   Social History Narrative  . None     Family History:  The patient's family history includes Breast cancer in his daughter; Colon cancer in his brother, father, and mother; Crohn's disease in his son; Ovarian cancer in his mother.   ROS:     Please see the history of present illness.    ROS All other systems reviewed and are negative.   PHYSICAL EXAM:   VS:  BP 140/84   Pulse 77   Ht 6\' 1"  (1.854 m)   Wt 220 lb (99.8 kg)   BMI 29.03 kg/m    GEN: Well nourished, well developed, in no acute distress  HEENT: normal  Neck: no JVD, carotid bruits, or masses Cardiac: RRR; no murmurs, rubs, or gallops,no edema  Respiratory:  clear to auscultation bilaterally, normal work of breathing GI: soft, nontender, nondistended, + BS. Right lower quadrant urostomy MS: no deformity or atrophy  Skin: warm and dry, no rash Neuro:  Alert and Oriented x 3, Strength and sensation are intact Psych: euthymic mood, full affect  Wt Readings from Last 3 Encounters:  01/30/16 220 lb (99.8 kg)  12/06/14 217 lb 9 oz (98.7 kg)  05/13/14 213 lb 6.4 oz (96.8 kg)      Studies/Labs Reviewed:   EKG:  EKG is ordered today.  The ekg ordered today demonstrates Sinus rhythm with mild first-degree AV block, incomplete right bundle branch block and left anterior fascicular block (PR 212 ms, QRS 100 ms, QTC 452 ms)  Recent Labs: Normal LFTs, creatinine 1.5, normal electrolytes, hemoglobin 15.5  Lipid Panel No results found for: CHOL, TRIG, HDL, CHOLHDL, VLDL, LDLCALC, LDLDIRECT  Additional studies/ records that were reviewed today include:  Notes from Urology, imaging studies.    ASSESSMENT:    1. Atrial fibrillation, unspecified type ([place])   2. Elevated BP without diagnosis of hypertension   3. Urothelial cancer ([place])      PLAN:  In order of problems listed above:  1. AFib: Asymptomatic on treatment with flecainide and diltiazem. CHADSVasc 2 (age only) and unable to take anticoagulants due to recurrent problems with hematuria. 2. Borderline elevation in BP: Avoid additional weight gain, try to lose some weight actually. Avoid sodium rich foods. 3.   Widespread urothelial cancer , followed in the oncology clinic at [facility] by Dr.  Dimas Oconnell    Medication Adjustments/Labs and Tests Ordered: Current medicines are reviewed at length with the patient today.  Concerns regarding medicines are outlined above.  Medication changes, Labs and Tests ordered today are listed in the Patient Instructions below. Patient Instructions  Dr Sallyanne Kuster recommends that you schedule a follow-up appointment in 1 year. You will receive a reminder letter in the mail two months in advance. If you don't receive a letter, please call our office to schedule the follow-up appointment.  If you need a refill on your cardiac medications before your next appointment, please call your pharmacy.    Signed, Jordan Klein, MD  01/30/2016 3:05 PM    [facility] [address], [place], [place]  [address] Phone: [phone]; Fax: [phone]

## 2016-05-31 ENCOUNTER — Telehealth: Payer: Self-pay | Admitting: Cardiovascular Disease

## 2016-05-31 DIAGNOSIS — Z01810 Encounter for preprocedural cardiovascular examination: Secondary | ICD-10-CM

## 2016-05-31 NOTE — Telephone Encounter (Signed)
Returned call to patient.   He states he is supposed to have surgery on May 28 w/ Dr. [name] @ [facility] to remove remainder of kidney. He states he needs an exercise stress test prior to this.   Message routed to MD

## 2016-05-31 NOTE — Telephone Encounter (Signed)
New Message  Pt states he is havign the remaining part his kidney removed and states he was told he needs to have a stress time complete before he goes in for his procedure. Please call back to discuss

## 2016-05-31 NOTE — Telephone Encounter (Signed)
Please schedule for exercise stress test first available. Do not stop diltiazem or flecainide please.

## 2016-06-01 NOTE — Telephone Encounter (Signed)
Stress test ordered LM for patient with info that MD ordered test and he should take all meds as directed Staff message sent to [facility] scheduling pool to arrange test for patient.

## 2016-06-04 ENCOUNTER — Telehealth (HOSPITAL_COMMUNITY): Payer: Self-pay

## 2016-06-04 NOTE — Telephone Encounter (Signed)
Encounter complete. 

## 2016-06-06 ENCOUNTER — Other Ambulatory Visit: Payer: Self-pay | Admitting: Cardiovascular Disease

## 2016-06-07 NOTE — Telephone Encounter (Signed)
Rx(s) sent to pharmacy electronically.  

## 2016-06-08 IMAGING — CR DG CHEST 2V
2 series · 2 of 2 positions shown · non-contrast
Comparison: Chest radiograph July 17, 2013; CT abdomen and pelvis
which included lung bases July 24, 2013

CLINICAL DATA: Pneumonia

EXAM:
CHEST  2 VIEW

[w chest lat]
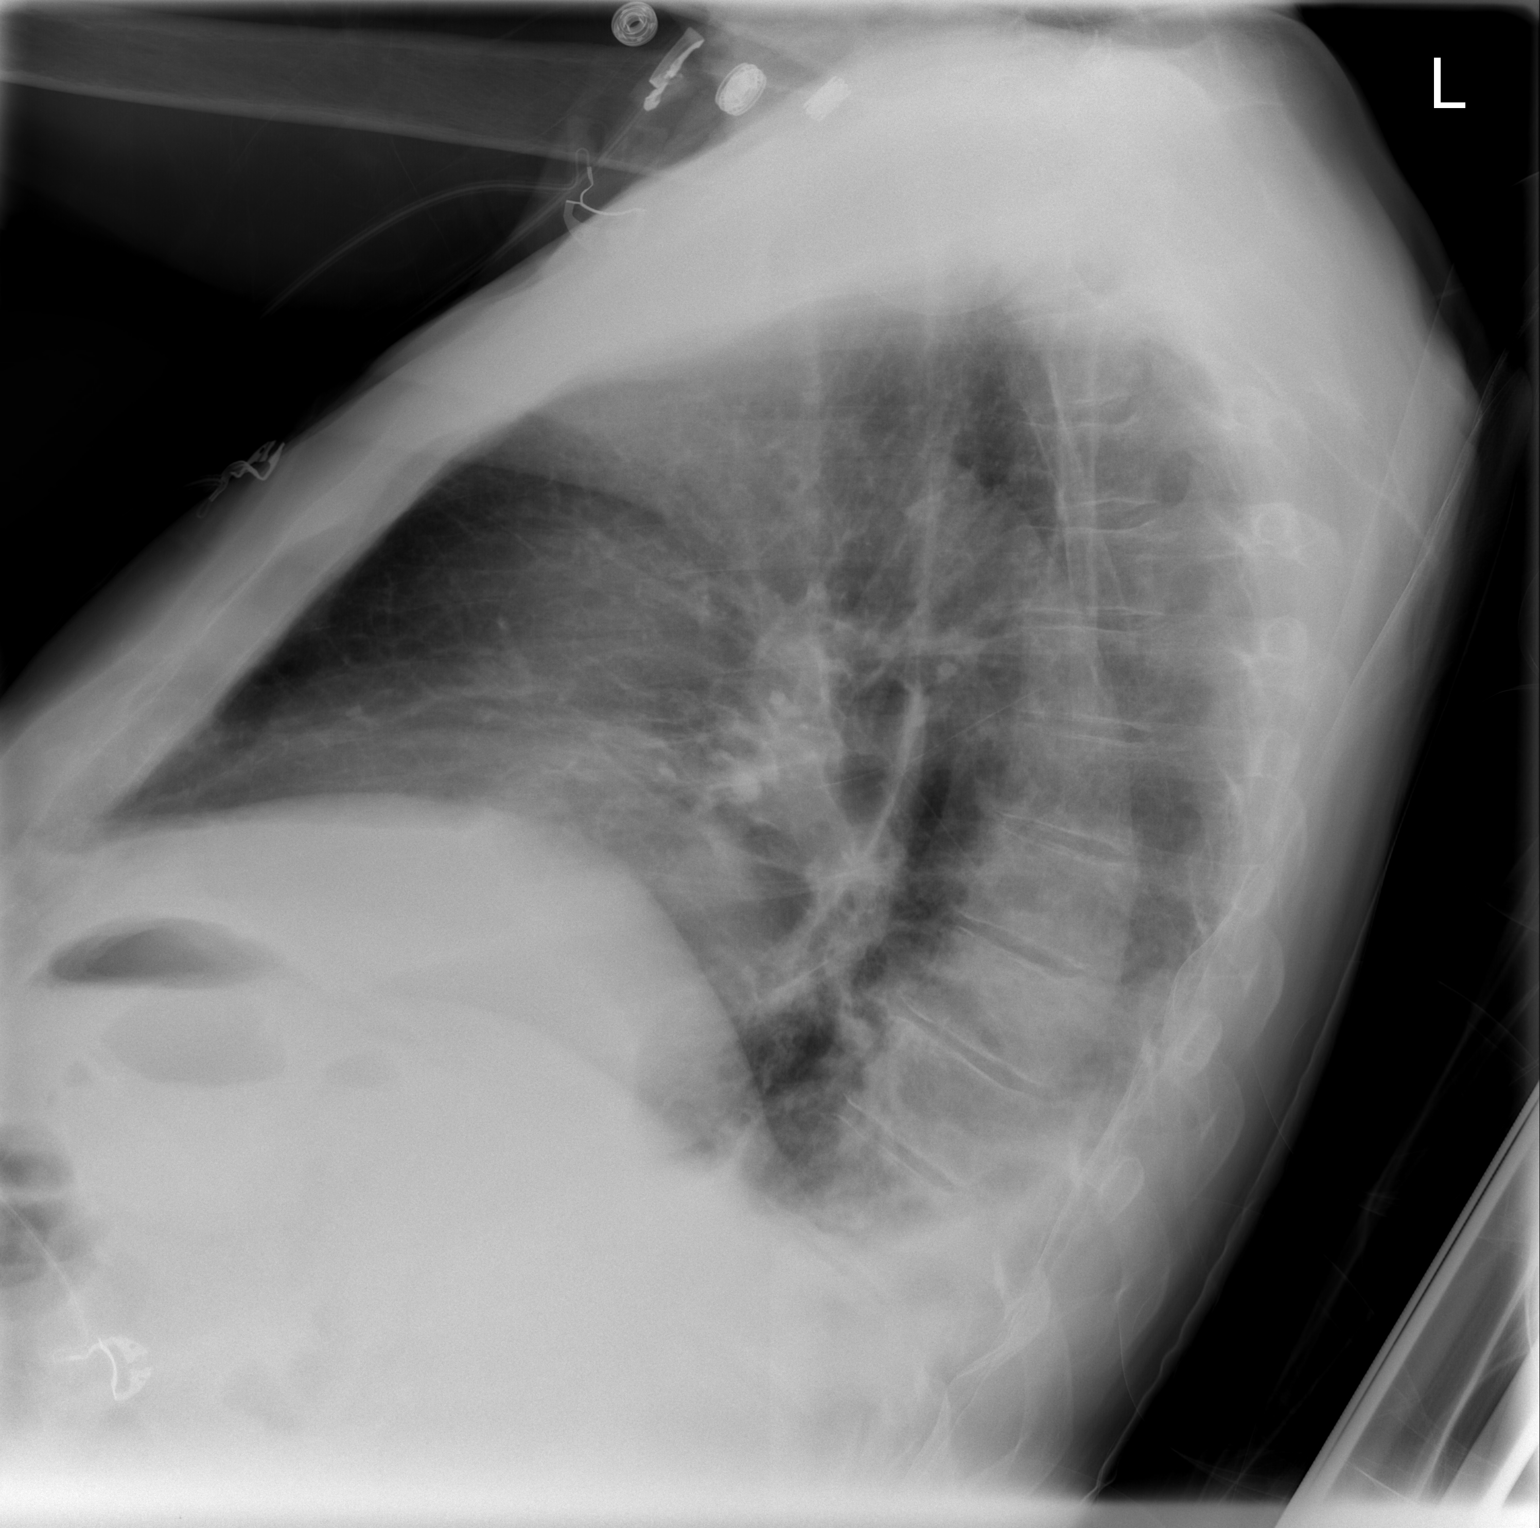

[view not recorded]
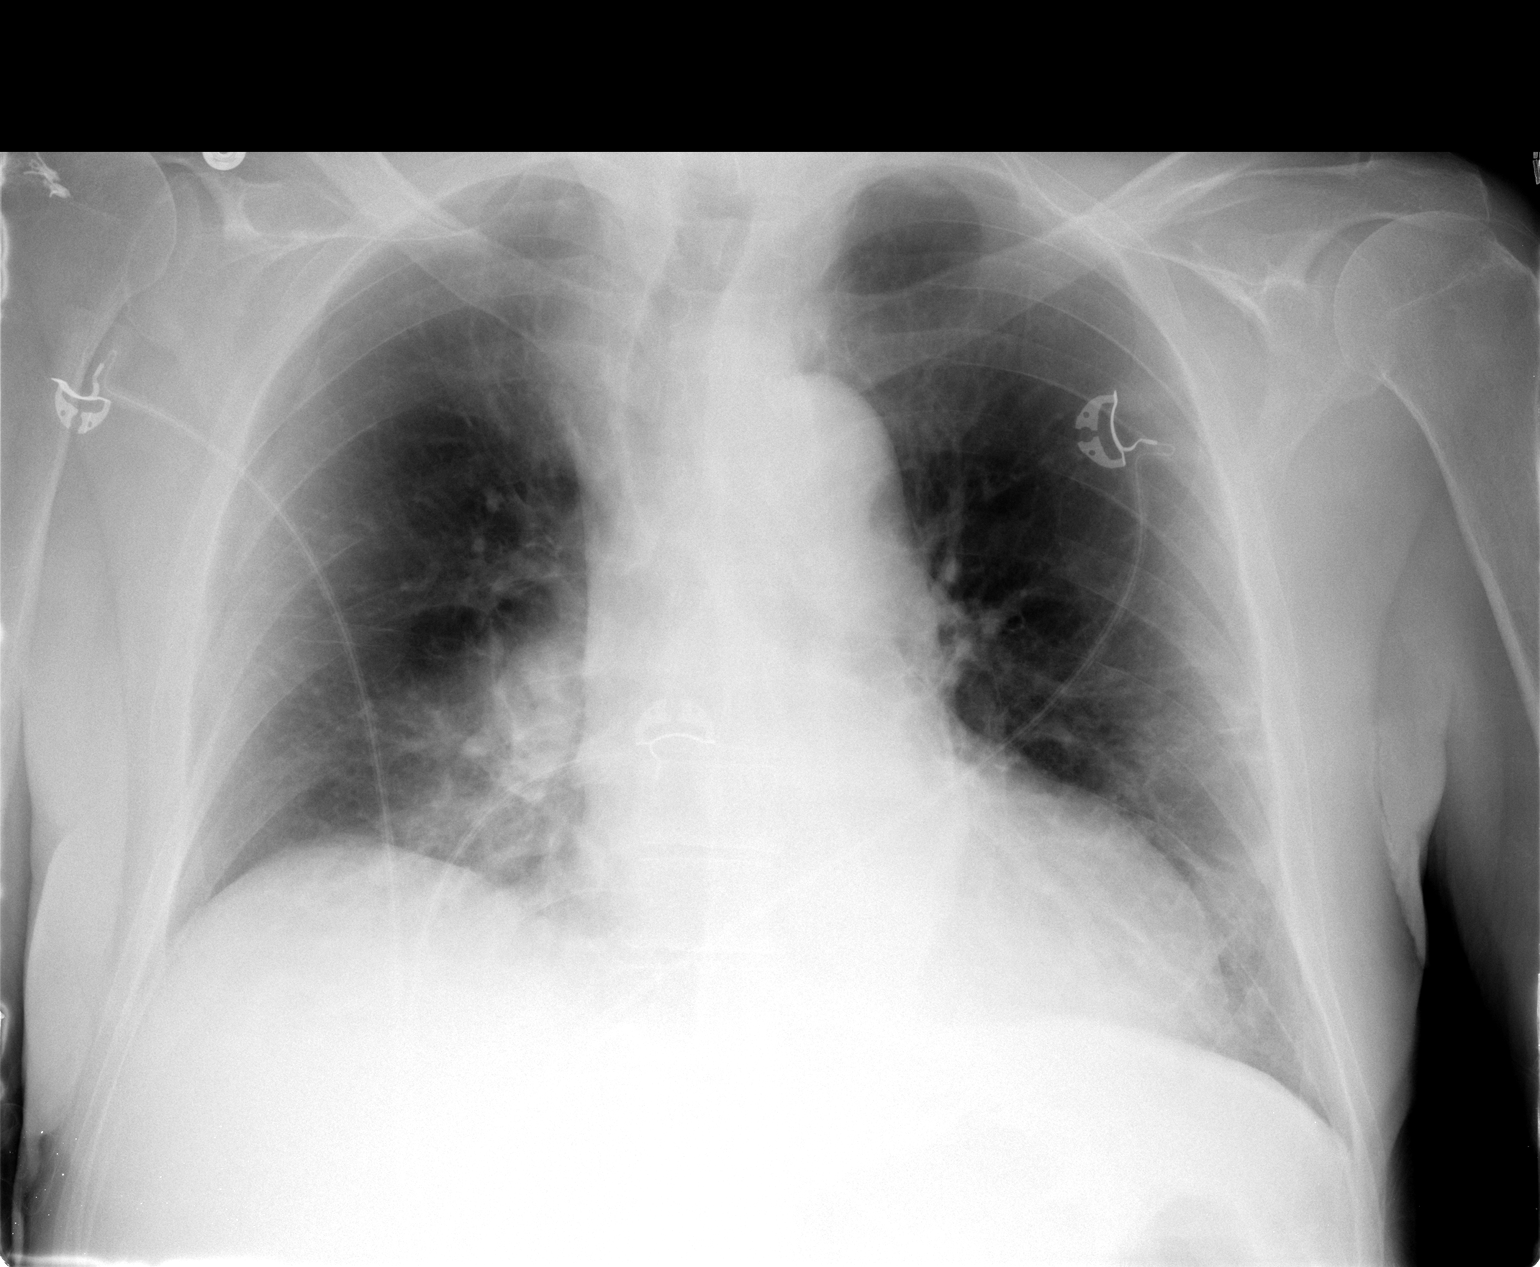

[2 of 2 positions shown; findings below may reference images not displayed]

FINDINGS: There is patchy infiltrate in both lung bases with significant
partial clearing on the right and a lesser degree of partial
clearing on the left compared to recent prior study. Elsewhere lungs
are clear. Heart size and pulmonary vascularity are within normal
limits. No adenopathy. There is degenerative change in the thoracic
spine.
IMPRESSION: There is consolidation in both lung bases, more on the right than on
the left. There is dense some clearing compared to recent prior
study, although there still remains moderate infiltrate,
particularly in the right lower lobe.

## 2016-06-09 ENCOUNTER — Ambulatory Visit (HOSPITAL_COMMUNITY)
Admission: RE | Admit: 2016-06-09 | Discharge: 2016-06-09 | Disposition: A | Discharge status: Home / Self Care | Payer: Medicare Other | Source: Ambulatory Visit | Attending: Cardiovascular Disease | Admitting: Cardiovascular Disease

## 2016-06-09 DIAGNOSIS — Z01810 Encounter for preprocedural cardiovascular examination: Secondary | ICD-10-CM | POA: Insufficient documentation

## 2016-06-09 LAB — EXERCISE TOLERANCE TEST
CHL RATE OF PERCEIVED EXERTION: 18
CSEPED: 9 min
CSEPHR: 90 %
Estimated workload: 10.4 METS
Exercise duration (sec): 7 s
MPHR: 143 {beats}/min
Peak HR: 130 {beats}/min
Rest HR: 78 {beats}/min

## 2016-06-11 ENCOUNTER — Telehealth: Payer: Self-pay | Admitting: Cardiovascular Disease

## 2016-06-11 NOTE — Telephone Encounter (Signed)
Notes recorded by Diana Eves, CMA on 06/11/2016 at 3:22 PM EDT Called patient with results. Patient verbalized understanding.  Patient requested copy of stress test be sent to Dr Rogelia Rohrer office and a copy for himself. Patient's copy left at front desk for pickup and test faxed to Dr Rogelia Rohrer office via [facility].

## 2016-06-11 NOTE — Telephone Encounter (Signed)
°  Follow Up   Pt is returning following up on results. Please call.

## 2016-07-02 ENCOUNTER — Telehealth: Payer: Self-pay | Admitting: Cardiovascular Disease

## 2016-07-02 NOTE — Telephone Encounter (Signed)
New message   Patient calling stating someone called him today - returning call back    No notation under encounter, notes, nor labs who had called patient

## 2016-07-02 NOTE — Telephone Encounter (Signed)
I returned Jordan Oconnell call - noted we didn't have anything recently on file as far as tests, results, appt recalls, etc - had spoken w Dr. Sallyanne Kuster and [place] and neither had called though Dr. Sallyanne Kuster mentioned sending clearance 2 weeks ago for procedure. Pt voiced thanks for return call, states he is doing well and had nephrectomy yesterday and recovering favorably from this. Asked me to pass along this piece of information to Dr. Sallyanne Kuster. Pt aware I will route msg, and if any needs or concerns to call.

## 2016-07-02 NOTE — Telephone Encounter (Signed)
Last interaction was the stress tests performed about 3 weeks ago, but I think we already called him with those results back then. I have not called him with other concerns. I'm glad he is doing well after his surgery. If you speak with him again, please send him my best wishes. MCr

## 2016-07-05 NOTE — Telephone Encounter (Signed)
Spoke w patient again to communicate Dr. Victorino December best wishes and support, patient verbalized appreciation for return call.

## 2016-09-22 DIAGNOSIS — Z936 Other artificial openings of urinary tract status: Secondary | ICD-10-CM

## 2017-02-01 ENCOUNTER — Encounter: Payer: Self-pay | Admitting: Cardiovascular Disease

## 2017-02-01 ENCOUNTER — Ambulatory Visit: Payer: Medicare Other | Admitting: Cardiovascular Disease

## 2017-02-01 VITALS — BP 142/70 | HR 84 | Ht 73.0 in | Wt 198.0 lb

## 2017-02-01 DIAGNOSIS — I480 Paroxysmal atrial fibrillation: Secondary | ICD-10-CM

## 2017-02-01 DIAGNOSIS — N184 Chronic kidney disease, stage 4 (severe): Secondary | ICD-10-CM | POA: Diagnosis not present

## 2017-02-01 DIAGNOSIS — C689 Malignant neoplasm of urinary organ, unspecified: Secondary | ICD-10-CM

## 2017-02-01 DIAGNOSIS — I350 Nonrheumatic aortic (valve) stenosis: Secondary | ICD-10-CM | POA: Insufficient documentation

## 2017-02-01 DIAGNOSIS — R030 Elevated blood-pressure reading, without diagnosis of hypertension: Secondary | ICD-10-CM | POA: Insufficient documentation

## 2017-02-01 NOTE — Patient Instructions (Signed)
Dr Croitoru recommends that you continue on your current medications as directed. Please refer to the Current Medication list given to you today.  Your physician has requested that you have an echocardiogram. Echocardiography is a painless test that uses sound waves to create images of your heart. It provides your doctor with information about the size and shape of your heart and how well your heart's chambers and valves are working. This procedure takes approximately one hour. There are no restrictions for this procedure. This will be performed at our [address] location - [address], [address].  Dr Croitoru recommends that you schedule a follow-up appointment in 12 months. You will receive a reminder letter in the mail two months in advance. If you don't receive a letter, please call our office to schedule the follow-up appointment.  If you need a refill on your cardiac medications before your next appointment, please call your pharmacy. 

## 2017-02-01 NOTE — Progress Notes (Signed)
Cardiology Office Note    Date:  02/01/2017   ID:  Oday, Ridings 1940/06/15, MRN [id number]  PCP:  Burnard Bunting, MD  Cardiologist:   Sanda Klein, MD   Chief Complaint  Patient presents with  . Follow-up    12 months    History of Present Illness:  Jordan Oconnell is a 77 y.o. male with paroxysmal atrial fibrillation and multiple sites of urothelial cancer involving his bladder ureters and renal pelvises bilaterally (history of partial left nephrectomy and complete cystectomy with urostomy.   He underwent a left nephrectomy in June 2018 for a 4 cm high-grade urothelial carcinoma. He last saw his urologist, Dr. [name] in September. His creatinine had abrupt increase to 4.3 from a baseline of about 2.5. Follow-up creatinine performed December 4 was 3.0  Prior to the winter snow he was walking 3 miles a day without restrictions. He went on vacation to [place] to see his family and while there had problems with weakness and even an episode of collapse, possibly an episode of pneumonia. He improved after treatment with azithromycin and is now back to baseline. He has lost about 20 pounds since last year.  He has not been controlled by palpitations and has not been aware of any atrial fibrillation. He was seen by at least 2 different medical professionals while in [place] and there was no mention of any rhythm irregularities.  He had a normal treadmill stress test last year. An echocardiogram in 2014 showed normal EF of 55-60% and mild aortic stenosis (mean gradient 10 mmHg, estimated aortic valve area around 1.6 cm), mildly dilated left atrium.  Past Medical History:  Diagnosis Date  . A-fib ([place])    with ventricular response  . Bladder cancer ([place])   . BPH (benign prostatic hyperplasia)   . Colon polyps   . Diverticulosis   . GERD (gastroesophageal reflux disease)   . Hyperlipidemia    pt denies  . Hypertension   . Renal cell carcinoma ([place]) 2008   left     Past Surgical History:  Procedure Laterality Date  . APPENDECTOMY    . BLADDER SURGERY  2008   transurethral resection/resection of prostatic urethra  . BOWEL RESECTION    . ESOPHAGOGASTRODUODENOSCOPY N/A 07/24/2013   Procedure: ESOPHAGOGASTRODUODENOSCOPY (EGD);  Surgeon: [name], MD;  Location: [name];  Service: Endoscopy;  Laterality: N/A;  . laparoscopic surgery  2012   laser   (?)  . NEPHRECTOMY  2007   partial, left  . NEPHROSTOMY  2011   stent  . NM MYOCAR PERF WALL MOTION  09/08/2010   Normal  . ROBOT ASSISTED LAPAROSCOPIC COMPLETE CYSTECT ILEAL CONDUIT    . US ECHOCARDIOGRAPHY  09/21/32   mild diastolic dysfunction,mild dilated LA,mild MR,AI,mildly dilated aortic root    Current Medications: Outpatient Medications Prior to Visit  Medication Sig Dispense Refill  . ALPRAZolam (XANAX) 0.25 MG tablet Take 0.25 mg by mouth at bedtime as needed for anxiety.     Marland Kitchen diltiazem (TIAZAC) 180 MG 24 hr capsule Take 180 mg by mouth every morning.     . escitalopram (LEXAPRO) 10 MG tablet Take 1 tablet by mouth every morning.    . flecainide (TAMBOCOR) 50 MG tablet TAKE ONE-HALF TABLET BY MOUTH TWICE DAILY 90 tablet 2  . hydrALAZINE (APRESOLINE) 50 MG tablet Take 50 mg by mouth 2 (two) times daily.    . mirtazapine (REMERON) 7.5 MG tablet Take 7.5 mg by mouth at bedtime.    Marland Kitchen  omeprazole (PRILOSEC) 20 MG capsule Take 2 capsules (40 mg total) by mouth daily. (Patient taking differently: Take 20 mg by mouth daily. ) 60 capsule 12   No facility-administered medications prior to visit.      Allergies:   Doxycycline and Flomax [tamsulosin hcl]   Social History   Socioeconomic History  . Marital status: Divorced    Spouse name: None  . Number of children: 2  . Years of education: None  . Highest education level: None  Social Needs  . Financial resource strain: None  . Food insecurity - worry: None  . Food insecurity - inability: None  . Transportation needs -  medical: None  . Transportation needs - non-medical: None  Occupational History  . Occupation: Pharmacist, hospital  Tobacco Use  . Smoking status: Never Smoker  . Smokeless tobacco: Never Used  Substance and Sexual Activity  . Alcohol use: Yes    Comment: occasionally  . Drug use: No  . Sexual activity: None  Other Topics Concern  . None  Social History Narrative  . None     Family History:  The patient's family history includes Breast cancer in his daughter; Colon cancer in his brother, father, and mother; Crohn's disease in his son; Ovarian cancer in his mother.   ROS:   Please see the history of present illness.    ROS All other systems reviewed and are negative.   PHYSICAL EXAM:   VS:  BP (!) 142/70   Pulse 84   Ht 6\' 1"  (1.854 m)   Wt 198 lb (89.8 kg)   BMI 26.12 kg/m     General: Alert, oriented x3, no distress, appears fit Head: no evidence of trauma, PERRL, EOMI, no exophtalmos or lid lag, no myxedema, no xanthelasma; normal ears, nose and oropharynx Neck: normal jugular venous pulsations and no hepatojugular reflux; brisk carotid pulses without delay and no carotid bruits Chest: clear to auscultation, no signs of consolidation by percussion or palpation, normal fremitus, symmetrical and full respiratory excursions Cardiovascular: normal position and quality of the apical impulse, regular rhythm, normal first and second heart sounds, no rubs or gallops, 3/6 mid peaking aortic ejection murmur, 1/6 decrescendo aortic insufficiency murmur heard best at the left lower sternal border Abdomen: no tenderness or distention, no masses by palpation, no abnormal pulsatility or arterial bruits, normal bowel sounds, no hepatosplenomegaly Extremities: no clubbing, cyanosis or edema; 2+ radial, ulnar and brachial pulses bilaterally; 2+ right femoral, posterior tibial and dorsalis pedis pulses; 2+ left femoral, posterior tibial and dorsalis pedis pulses; no subclavian or femoral  bruits Neurological: grossly nonfocal Psych: Normal mood and affect   Wt Readings from Last 3 Encounters:  02/01/17 198 lb (89.8 kg)  01/30/16 220 lb (99.8 kg)  12/06/14 217 lb 9 oz (98.7 kg)      Studies/Labs Reviewed:   EKG:  EKG is ordered today. It shows normal sinus rhythm with first-degree A-V block (PR 238 ms), minimal prolongation of the QRS at 104 ms, QTC 460 ms. In 2018 the QRS was 90 ms, and 2016 the QRS was 106 ms.  Recent Labs: 12/28/2016 creatinine 3.0, BUN 36, potassium 4.3, glucose 108 Hemoglobin 13.2 Additional studies/ records that were reviewed today include:  Notes from Dr. [name], [facility]  ASSESSMENT:    1. Paroxysmal atrial fibrillation (HCC)   2. Nonrheumatic aortic valve stenosis   3. Elevated blood pressure reading   4. CKD (chronic kidney disease) stage 4, GFR 15-29 ml/min (HCC)   5.  Urothelial cancer ([place])      PLAN:  In order of problems listed above:  1. AFib: Remains asymptomatic on well-tolerated treatment with flecainide and diltiazem. Minimal QRS interval prolongation is a chronic finding. Despite presence of prolonged PR interval has not had any evidence of higher grade AV block. CHADSVasc 2 (age only) and unable to take anticoagulants due to recurrent problems with hematuria. 2. AS: His murmur substantially louder than I recall and there is now also an accompanying murmur of aortic insufficiency. Recommended that he repeat an echocardiogram to compare with the one from 2014. 3. Borderline elevation in BP: Elevated blood pressure today is minimal and an isolated finding. Systolic blood pressure has been in the 110-120 range consistently at his other recent office visits and while he was being seen in [place]. 4. CKD4: Following right nephrectomy and partial left nephrectomy for widespread urothelial cancer. Creatinine seems to have stabilized around 3.0/GFR 20 5. Widespread urothelial cancer , followed in the oncology clinic at [facility] by Dr. [name], Nephrology Dr. [name].    Medication Adjustments/Labs and Tests Ordered: Current medicines are reviewed at length with the patient today.  Concerns regarding medicines are outlined above.  Medication changes, Labs and Tests ordered today are listed in the Patient Instructions below. Patient Instructions  Dr Sallyanne Kuster recommends that you continue on your current medications as directed. Please refer to the Current Medication list given to you today.  Your physician has requested that you have an echocardiogram. Echocardiography is a painless test that uses sound waves to create images of your heart. It provides your doctor with information about the size and shape of your heart and how well your heart's chambers and valves are working. This procedure takes approximately one hour. There are no restrictions for this procedure. >>This will be performed at our [facility] location - [address], [address]  Dr Sallyanne Kuster recommends that you schedule a follow-up appointment in 12 months. You will receive a reminder letter in the mail two months in advance. If you don't receive a letter, please call our office to schedule the follow-up appointment.  If you need a refill on your cardiac medications before your next appointment, please call your pharmacy.    Signed, Sanda Klein, MD  02/01/2017 9:22 AM    [facility] [place], [place], [place]  [address] Phone: [phone]; Fax: [phone]

## 2017-02-07 ENCOUNTER — Ambulatory Visit (HOSPITAL_COMMUNITY): Payer: Medicare Other | Attending: Cardiovascular Disease

## 2017-02-07 ENCOUNTER — Other Ambulatory Visit: Payer: Self-pay

## 2017-02-07 DIAGNOSIS — I253 Aneurysm of heart: Secondary | ICD-10-CM | POA: Insufficient documentation

## 2017-02-07 DIAGNOSIS — I350 Nonrheumatic aortic (valve) stenosis: Secondary | ICD-10-CM | POA: Insufficient documentation

## 2017-02-15 ENCOUNTER — Telehealth: Payer: Self-pay | Admitting: Cardiovascular Disease

## 2017-02-15 NOTE — Telephone Encounter (Signed)
Returned call to patient. Results reviewed. Patient verbalized understanding and agreed with plan.

## 2017-02-15 NOTE — Telephone Encounter (Signed)
Patient dropped by office stating that he never received a call regarding his echo results. Please advise.

## 2017-02-22 ENCOUNTER — Other Ambulatory Visit: Payer: Self-pay | Admitting: Cardiovascular Disease

## 2017-02-24 ENCOUNTER — Other Ambulatory Visit: Payer: Self-pay | Admitting: Family Medicine

## 2017-02-24 DIAGNOSIS — K469 Unspecified abdominal hernia without obstruction or gangrene: Secondary | ICD-10-CM

## 2017-02-28 ENCOUNTER — Ambulatory Visit
Admission: RE | Admit: 2017-02-28 | Discharge: 2017-02-28 | Disposition: A | Discharge status: Home / Self Care | Payer: Medicare Other | Source: Ambulatory Visit | Attending: Family Medicine | Admitting: Family Medicine

## 2017-02-28 DIAGNOSIS — K469 Unspecified abdominal hernia without obstruction or gangrene: Secondary | ICD-10-CM

## 2017-06-13 ENCOUNTER — Encounter: Payer: Self-pay | Admitting: Internal Medicine

## 2017-11-29 ENCOUNTER — Other Ambulatory Visit: Payer: Self-pay | Admitting: Cardiovascular Disease

## 2017-11-29 NOTE — Telephone Encounter (Signed)
Rx request sent to pharmacy.  

## 2018-03-17 ENCOUNTER — Encounter: Payer: Self-pay | Admitting: Cardiovascular Disease

## 2018-03-17 ENCOUNTER — Ambulatory Visit: Payer: Medicare Other | Admitting: Cardiovascular Disease

## 2018-03-17 VITALS — BP 144/69 | HR 80 | Ht 73.0 in | Wt 204.4 lb

## 2018-03-17 DIAGNOSIS — I480 Paroxysmal atrial fibrillation: Secondary | ICD-10-CM

## 2018-03-17 DIAGNOSIS — C689 Malignant neoplasm of urinary organ, unspecified: Secondary | ICD-10-CM

## 2018-03-17 DIAGNOSIS — I10 Essential (primary) hypertension: Secondary | ICD-10-CM

## 2018-03-17 DIAGNOSIS — I350 Nonrheumatic aortic (valve) stenosis: Secondary | ICD-10-CM

## 2018-03-17 DIAGNOSIS — N184 Chronic kidney disease, stage 4 (severe): Secondary | ICD-10-CM | POA: Diagnosis not present

## 2018-03-17 NOTE — Patient Instructions (Signed)
Medication Instructions:  NO CHANGE If you need a refill on your cardiac medications before your next appointment, please call your pharmacy.   Lab work: If you have labs (blood work) drawn today and your tests are completely normal, you will receive your results only by: Marland Kitchen MyChart Message (if you have MyChart) OR . A paper copy in the mail If you have any lab test that is abnormal or we need to change your treatment, we will call you to review the results.  Testing/Procedures: Your physician has requested that you have an echocardiogram. Echocardiography is a painless test that uses sound waves to create images of your heart. It provides your doctor with information about the size and shape of your heart and how well your heart's chambers and valves are working. This procedure takes approximately one hour. There are no restrictions for this procedure.  [place] 1 YEAR  Follow-Up: At [facility], you and your health needs are our priority.  As part of our continuing mission to provide you with exceptional heart care, we have created designated Provider Care Teams.  These Care Teams include your primary Cardiologist (physician) and Advanced Practice Providers (APPs -  Physician Assistants and Nurse Practitioners) who all work together to provide you with the care you need, when you need it. You will need a follow up appointment in 12 months AFTER ECHOCARDIOGRAM IS COMPLETE  Please call our office 2 months in advance to schedule this appointment.  You may see Sanda Klein, MD or one of the following Advanced Practice Providers on your designated Care Team: [place], [place] . Fabian Sharp, PA-C

## 2018-03-17 NOTE — Progress Notes (Signed)
Cardiology Office Note    Date:  03/17/2018   ID:  Jordan, Oconnell February 15, 1940, MRN [id number]  PCP:  Burnard Bunting, MD  Cardiologist:   Sanda Klein, MD   No chief complaint on file.   History of Present Illness:  Jordan Oconnell is a 78 y.o. male with paroxysmal atrial fibrillation and multiple sites of urothelial cancer involving his bladder, ureters and renal pelvises bilaterally (history of partial left nephrectomy and complete cystectomy with urostomy), chronic kidney disease with a baseline creatinine around 3.0..   Followed at [facility] by his urologist, Dr. Lorine Oconnell, most recent evaluation with no evidence of cancer recurrence by MRI in October 2019.  He saw his nephrologist, Dr. Morrison Oconnell the same day.  His blood pressure was 140/70, no changes were made to his medications.  He walks daily. The patient specifically denies any chest pain at rest exertion, dyspnea at rest or with exertion, orthopnea, paroxysmal nocturnal dyspnea, syncope, palpitations, focal neurological deficits, intermittent claudication, lower extremity edema, unexplained weight gain, cough, hemoptysis or wheezing.  He had a normal treadmill stress test last year. An echocardiogram in January 2019 showed normal EF of 60-65% and moderate aortic stenosis (mean gradient 2 39mmHg, estimated aortic valve area around 1.6 cm), mild AI, incidental note of atrial septal aneurysm.  The aortic stenosis had worsened compared to the echo from 2015 and the mean gradient was only 10 mmHg.  Past Medical History:  Diagnosis Date  . A-fib ([place])    with ventricular response  . Bladder cancer ([place])   . BPH (benign prostatic hyperplasia)   . Colon polyps   . Diverticulosis   . GERD (gastroesophageal reflux disease)   . Hyperlipidemia    pt denies  . Hypertension   . Renal cell carcinoma ([place]) 2008   left    Past Surgical History:  Procedure Laterality Date  . APPENDECTOMY    . BLADDER SURGERY  2008    transurethral resection/resection of prostatic urethra  . BOWEL RESECTION    . ESOPHAGOGASTRODUODENOSCOPY N/A 07/24/2013   Procedure: ESOPHAGOGASTRODUODENOSCOPY (EGD);  Surgeon: [name], MD;  Location: [name];  Service: Endoscopy;  Laterality: N/A;  . laparoscopic surgery  2012   laser   (?)  . NEPHRECTOMY  2007   partial, left  . NEPHROSTOMY  2011   stent  . NM MYOCAR PERF WALL MOTION  09/08/2010   Normal  . ROBOT ASSISTED LAPAROSCOPIC COMPLETE CYSTECT ILEAL CONDUIT    . US ECHOCARDIOGRAPHY  6/44/0347   mild diastolic dysfunction,mild dilated LA,mild MR,AI,mildly dilated aortic root    Current Medications: Outpatient Medications Prior to Visit  Medication Sig Dispense Refill  . ALPRAZolam (XANAX) 0.25 MG tablet Take 0.25 mg by mouth at bedtime as needed for anxiety.     Marland Kitchen diltiazem (TIAZAC) 180 MG 24 hr capsule Take 180 mg by mouth every morning.     . escitalopram (LEXAPRO) 10 MG tablet Take 1 tablet by mouth every morning.    . flecainide (TAMBOCOR) 50 MG tablet TAKE 1/2 (ONE-HALF) TABLET BY MOUTH TWICE DAILY 90 tablet 2  . hydrALAZINE (APRESOLINE) 50 MG tablet Take 50 mg by mouth 2 (two) times daily.    . mirtazapine (REMERON) 7.5 MG tablet Take 7.5 mg by mouth at bedtime.    Marland Kitchen omeprazole (PRILOSEC) 20 MG capsule Take 2 capsules (40 mg total) by mouth daily. (Patient taking differently: Take 20 mg by mouth daily. ) 60 capsule 12   No facility-administered  medications prior to visit.      Allergies:   Amoxicillin; Tamsulosin; Doxycycline; and Flomax [tamsulosin hcl]   Social History   Socioeconomic History  . Marital status: Divorced    Spouse name: Not on file  . Number of children: 2  . Years of education: Not on file  . Highest education level: Not on file  Occupational History  . Occupation: Pharmacist, hospital  Social Needs  . Financial resource strain: Not on file  . Food insecurity:    Worry: Not on file    Inability: Not on file  . Transportation needs:     Medical: Not on file    Non-medical: Not on file  Tobacco Use  . Smoking status: Never Smoker  . Smokeless tobacco: Never Used  Substance and Sexual Activity  . Alcohol use: Yes    Comment: occasionally  . Drug use: No  . Sexual activity: Not on file  Lifestyle  . Physical activity:    Days per week: Not on file    Minutes per session: Not on file  . Stress: Not on file  Relationships  . Social connections:    Talks on phone: Not on file    Gets together: Not on file    Attends religious service: Not on file    Active member of club or organization: Not on file    Attends meetings of clubs or organizations: Not on file    Relationship status: Not on file  Other Topics Concern  . Not on file  Social History Narrative  . Not on file     Family History:  The patient's family history includes Breast cancer in his daughter; Colon cancer in his brother, father, and mother; Crohn's disease in his son; Ovarian cancer in his mother.   ROS:   Please see the history of present illness.    ROS all other systems are reviewed and are negative PHYSICAL EXAM:   VS:  BP (!) 152/60   Pulse 80   Ht 6\' 1"  (1.854 m)   Wt 204 lb 6.4 oz (92.7 kg)   BMI 26.97 kg/m      General: Alert, oriented x3, no distress, overweight Head: no evidence of trauma, PERRL, EOMI, no exophtalmos or lid lag, no myxedema, no xanthelasma; normal ears, nose and oropharynx Neck: normal jugular venous pulsations and no hepatojugular reflux; brisk carotid pulses without delay and no carotid bruits Chest: clear to auscultation, no signs of consolidation by percussion or palpation, normal fremitus, symmetrical and full respiratory excursions Cardiovascular: normal position and quality of the apical impulse, regular rhythm, normal first and distinct second heart sounds, 3/6 early peaking systolic ejection murmur at the aortic focus, 1/6 decrescendo diastolic murmur at the left lower sternal border, no rubs or  gallops Abdomen: no tenderness or distention, no masses by palpation, no abnormal pulsatility or arterial bruits, normal bowel sounds, no hepatosplenomegaly.  Urostomy. Extremities: no clubbing, cyanosis or edema; 2+ radial, ulnar and brachial pulses bilaterally; 2+ right femoral, posterior tibial and dorsalis pedis pulses; 2+ left femoral, posterior tibial and dorsalis pedis pulses; no subclavian or femoral bruits Neurological: grossly nonfocal Psych: Normal mood and affect    Wt Readings from Last 3 Encounters:  03/17/18 204 lb 6.4 oz (92.7 kg)  02/01/17 198 lb (89.8 kg)  01/30/16 220 lb (99.8 kg)      Studies/Labs Reviewed:   EKG:  EKG is ordered today.  It shows normal sinus rhythm with first-degree AV block, poor R wave  progression left axis deviation just meeting criteria for left anterior fascicular block, QS pattern in V1 V2, QTC 435 ms.  There is stable minimal prolongation of the QRS duration.  Recent Labs: January 27, 2018 creatinine 2.9, Hemoglobin 14.2 Total cholesterol 153, HDL 38, LDL 94, triglycerides 103 Additional studies/ records that were reviewed today include:  Notes from Dr. [name] and Dr. Morrison Oconnell, [facility] Jan 2019  ASSESSMENT:    No diagnosis found.   PLAN:  In order of problems listed above:  1. AFib: He has not had symptomatic recurrence for years.  Remains asymptomatic on well-tolerated treatment with flecainide and diltiazem.  Mild first-degree AV block and minimum prolongation of the QRS complex are chronic and stable findings.  CHADSVasc 3 (age, HTN), but unable to take anticoagulants due to recurrent problems with hematuria. 2. AS: Moderate aortic stenosis, progressed that expected pacing since 2014.  Discussed the symptoms of exertional angina, exertional dyspnea, exertional syncope that would herald the need for intervention. 3. HTN: Mildly elevated systolic blood pressure, similar to findings when he saw his nephrologist a few months ago.  I  did not make any changes to his medications. 4. CKD4: Following right nephrectomy and partial left nephrectomy for widespread urothelial cancer.  Baseline creatinine seems to be around 3.0/GFR 20, stable. 5. Widespread urothelial cancer , followed in the oncology clinic at [facility] by Dr. [name], Nephrology Dr. Morrison Oconnell.  Good report at his follow-up MRI in October 2019.    Medication Adjustments/Labs and Tests Ordered: Current medicines are reviewed at length with the patient today.  Concerns regarding medicines are outlined above.  Medication changes, Labs and Tests ordered today are listed in the Patient Instructions below. There are no Patient Instructions on file for this visit.   Signed, Sanda Klein, MD  03/17/2018 10:06 AM    [facility] [address], [place], [place]  [address] Phone: [phone]; Fax: [phone]

## 2018-03-18 ENCOUNTER — Encounter: Payer: Self-pay | Admitting: Cardiovascular Disease

## 2018-08-27 ENCOUNTER — Other Ambulatory Visit: Payer: Self-pay | Admitting: Cardiovascular Disease

## 2019-02-14 ENCOUNTER — Ambulatory Visit: Payer: Medicare PPO | Attending: Internal Medicine

## 2019-02-14 DIAGNOSIS — Z23 Encounter for immunization: Secondary | ICD-10-CM

## 2019-02-14 NOTE — Progress Notes (Signed)
   Covid-19 Vaccination Clinic  Name:  Jordan Oconnell    MRN: [id number] DOB: 1940/12/24  02/14/2019  Jordan Oconnell was observed post Covid-19 immunization for 15 minutes without incidence. He was provided with Vaccine Information Sheet and instruction to access the V-Safe system.   Jordan Oconnell was instructed to call 911 with any severe reactions post vaccine: Marland Kitchen Difficulty breathing  . Swelling of your face and throat  . A fast heartbeat  . A bad rash all over your body  . Dizziness and weakness    Immunizations Administered    Name Date Dose VIS Date Route   Pfizer COVID-19 Vaccine 02/14/2019  1:27 PM 0.3 mL 01/05/2019 Intramuscular   Manufacturer: [place]   Lot: [id number]   [place]: [id number]

## 2019-02-20 ENCOUNTER — Other Ambulatory Visit: Payer: Self-pay | Admitting: Cardiovascular Disease

## 2019-02-24 ENCOUNTER — Ambulatory Visit: Payer: Medicare Other

## 2019-02-26 ENCOUNTER — Ambulatory Visit: Payer: Medicare Other

## 2019-03-04 ENCOUNTER — Ambulatory Visit: Payer: Medicare PPO | Attending: Internal Medicine

## 2019-03-04 DIAGNOSIS — Z23 Encounter for immunization: Secondary | ICD-10-CM | POA: Insufficient documentation

## 2019-03-04 NOTE — Progress Notes (Signed)
   Covid-19 Vaccination Clinic  Name:  Jordan Oconnell    MRN: [id number] DOB: 20-Jan-1941  03/04/2019  [name] was observed post Covid-19 immunization for 15 minutes without incidence. He was provided with Vaccine Information Sheet and instruction to access the V-Safe system.   [name] was instructed to call 911 with any severe reactions post vaccine: Marland Kitchen Difficulty breathing  . Swelling of your face and throat  . A fast heartbeat  . A bad rash all over your body  . Dizziness and weakness    Immunizations Administered    Name Date Dose VIS Date Route   Pfizer COVID-19 Vaccine 03/04/2019 10:40 AM 0.3 mL 01/05/2019 Intramuscular   Manufacturer: [place]   Lot: [id number]   [place]: [id number]

## 2019-03-27 ENCOUNTER — Other Ambulatory Visit: Payer: Self-pay | Admitting: Cardiovascular Disease

## 2019-04-04 ENCOUNTER — Ambulatory Visit (HOSPITAL_COMMUNITY): Payer: Medicare PPO | Attending: Cardiovascular Disease

## 2019-04-04 ENCOUNTER — Other Ambulatory Visit: Payer: Self-pay

## 2019-04-04 DIAGNOSIS — I350 Nonrheumatic aortic (valve) stenosis: Secondary | ICD-10-CM | POA: Insufficient documentation

## 2019-04-20 ENCOUNTER — Ambulatory Visit: Payer: Medicare PPO | Admitting: Cardiovascular Disease

## 2019-04-20 DIAGNOSIS — H40023 Open angle with borderline findings, high risk, bilateral: Secondary | ICD-10-CM | POA: Diagnosis not present

## 2019-04-20 DIAGNOSIS — H35033 Hypertensive retinopathy, bilateral: Secondary | ICD-10-CM | POA: Diagnosis not present

## 2019-04-20 DIAGNOSIS — H25813 Combined forms of age-related cataract, bilateral: Secondary | ICD-10-CM | POA: Diagnosis not present

## 2019-05-09 ENCOUNTER — Encounter: Payer: Self-pay | Admitting: Cardiovascular Disease

## 2019-05-09 ENCOUNTER — Ambulatory Visit: Payer: Medicare PPO | Admitting: Cardiovascular Disease

## 2019-05-09 ENCOUNTER — Other Ambulatory Visit: Payer: Self-pay

## 2019-05-09 VITALS — BP 140/80 | HR 76 | Temp 97.2°F | Ht 72.0 in | Wt 201.0 lb

## 2019-05-09 DIAGNOSIS — Z5181 Encounter for therapeutic drug level monitoring: Secondary | ICD-10-CM

## 2019-05-09 DIAGNOSIS — I350 Nonrheumatic aortic (valve) stenosis: Secondary | ICD-10-CM | POA: Diagnosis not present

## 2019-05-09 DIAGNOSIS — C689 Malignant neoplasm of urinary organ, unspecified: Secondary | ICD-10-CM

## 2019-05-09 DIAGNOSIS — Z79899 Other long term (current) drug therapy: Secondary | ICD-10-CM | POA: Diagnosis not present

## 2019-05-09 DIAGNOSIS — N184 Chronic kidney disease, stage 4 (severe): Secondary | ICD-10-CM | POA: Diagnosis not present

## 2019-05-09 DIAGNOSIS — I480 Paroxysmal atrial fibrillation: Secondary | ICD-10-CM

## 2019-05-09 DIAGNOSIS — I10 Essential (primary) hypertension: Secondary | ICD-10-CM

## 2019-05-09 NOTE — Patient Instructions (Signed)
Medication Instructions:  No changes *If you need a refill on your cardiac medications before your next appointment, please call your pharmacy*   Lab Work: None ordered If you have labs (blood work) drawn today and your tests are completely normal, you will receive your results only by: . MyChart Message (if you have MyChart) OR . A paper copy in the mail If you have any lab test that is abnormal or we need to change your treatment, we will call you to review the results.   Testing/Procedures: Your physician has requested that you have an echocardiogram in 12 months. Echocardiography is a painless test that uses sound waves to create images of your heart. It provides your doctor with information about the size and shape of your heart and how well your heart's chambers and valves are working. You may receive an ultrasound enhancing agent through an IV if needed to better visualize your heart during the echo.This procedure takes approximately one hour. There are no restrictions for this procedure. This will take place at the [address], [address].     Follow-Up: At [facility], you and your health needs are our priority.  As part of our continuing mission to provide you with exceptional heart care, we have created designated Provider Care Teams.  These Care Teams include your primary Cardiologist (physician) and Advanced Practice Providers (APPs -  Physician Assistants and Nurse Practitioners) who all work together to provide you with the care you need, when you need it.  We recommend signing up for the patient portal called "MyChart".  Sign up information is provided on this After Visit Summary.  MyChart is used to connect with patients for Virtual Visits (Telemedicine).  Patients are able to view lab/test results, encounter notes, upcoming appointments, etc.  Non-urgent messages can be sent to your provider as well.   To learn more about what you can do with MyChart, go to  [email or web address].mychart.com.    Your next appointment:   12 month(s)  The format for your next appointment:   In Person  Provider:   You may see Mihai Croitoru, MD or one of the following Advanced Practice Providers on your designated Care Team:    Hao Meng, PA-C  Angela [facility], PA-C or   Krista Kroeger, PA-C   

## 2019-05-09 NOTE — Progress Notes (Signed)
Cardiology Office Note    Date:  05/09/2019   ID:  Oconnell, Jordan [birth year], MRN [id number]  PCP:  Burnard Bunting, MD  Cardiologist:   Sanda Klein, MD   Chief Complaint  Patient presents with  . Cardiac Valve Problem  . Atrial Fibrillation    History of Present Illness:  Jordan Oconnell is a 79 y.o. male with paroxysmal atrial fibrillation and multiple sites of urothelial cancer involving his bladder, ureters and renal pelvises bilaterally (history of partial left nephrectomy and complete cystectomy with urostomy), chronic kidney disease with a baseline creatinine around 2.8-3.0, with asymptomatic moderate to severe aortic stenosis.  He feels great.  He is walking 3.1 miles a day every day of the week without difficulty.  He denies shortness of breath, angina dizziness or syncope with activity.  He has rare brief palpitations.  These have actually not bother him in months.  He denies leg edema or claudication, focal neurological complaints or any bleeding problems.  Followed at [facility] by his urologist, Dr. [name], most recent evaluation in June 2020 with no evidence of cancer recurrence by imaging studies.  His creatinine has been stable at 2.8-3.0 over the last couple of years.  He had a normal treadmill stress test in 2018.   His follow-up echocardiogram in March of this year shows preserved LVEF at 60 to 65%, but aortic stenosis has worsened: Peak gradient 57, mean gradient 37, estimated valve area 1 cm.  Dimensionless index 0.25.  At least mild aortic regurgitation.  Mild dilation of the ascending aorta at 41 mm.  Past Medical History:  Diagnosis Date  . A-fib ([place])    with ventricular response  . Bladder cancer ([place])   . BPH (benign prostatic hyperplasia)   . Colon polyps   . Diverticulosis   . GERD (gastroesophageal reflux disease)   . Hyperlipidemia    pt denies  . Hypertension   . Renal cell carcinoma ([place]) 2008   left    Past Surgical History:   Procedure Laterality Date  . APPENDECTOMY    . BLADDER SURGERY  2008   transurethral resection/resection of prostatic urethra  . BOWEL RESECTION    . ESOPHAGOGASTRODUODENOSCOPY N/A 07/24/2013   Procedure: ESOPHAGOGASTRODUODENOSCOPY (EGD);  Surgeon: [name], MD;  Location: [name];  Service: Endoscopy;  Laterality: N/A;  . laparoscopic surgery  2012   laser   (?)  . NEPHRECTOMY  2007   partial, left  . NEPHROSTOMY  2011   stent  . NM MYOCAR PERF WALL MOTION  09/08/2010   Normal  . ROBOT ASSISTED LAPAROSCOPIC COMPLETE CYSTECT ILEAL CONDUIT    . US ECHOCARDIOGRAPHY  4/43/1540   mild diastolic dysfunction,mild dilated LA,mild MR,AI,mildly dilated aortic root    Current Medications: Outpatient Medications Prior to Visit  Medication Sig Dispense Refill  . ALPRAZolam (XANAX) 0.25 MG tablet Take 0.25 mg by mouth at bedtime as needed for anxiety.     Marland Kitchen diltiazem (TIAZAC) 180 MG 24 hr capsule Take 180 mg by mouth every morning.     . escitalopram (LEXAPRO) 10 MG tablet Take 1 tablet by mouth every morning.    . flecainide (TAMBOCOR) 50 MG tablet Take 1/2 (one-half) tablet by mouth twice daily 30 tablet 1  . hydrALAZINE (APRESOLINE) 50 MG tablet Take 50 mg by mouth 2 (two) times daily.    . mirtazapine (REMERON) 7.5 MG tablet Take 7.5 mg by mouth at bedtime.    Marland Kitchen omeprazole (PRILOSEC) 20 MG  capsule Take 2 capsules (40 mg total) by mouth daily. (Patient taking differently: Take 20 mg by mouth daily. ) 60 capsule 12   No facility-administered medications prior to visit.     Allergies:   Amoxicillin, Tamsulosin, Doxycycline, and Flomax [tamsulosin hcl]   Social History   Socioeconomic History  . Marital status: Divorced    Spouse name: Not on file  . Number of children: 2  . Years of education: Not on file  . Highest education level: Not on file  Occupational History  . Occupation: Pharmacist, hospital  Tobacco Use  . Smoking status: Never Smoker  . Smokeless tobacco: Never Used   Substance and Sexual Activity  . Alcohol use: Yes    Comment: occasionally  . Drug use: No  . Sexual activity: Not on file  Other Topics Concern  . Not on file  Social History Narrative  . Not on file   Social Determinants of Health   Financial Resource Strain:   . Difficulty of Paying Living Expenses:   Food Insecurity:   . Worried About Charity fundraiser in the Last Year:   . Arboriculturist in the Last Year:   Transportation Needs:   . Film/video editor (Medical):   Marland Kitchen Lack of Transportation (Non-Medical):   Physical Activity:   . Days of Exercise per Week:   . Minutes of Exercise per Session:   Stress:   . Feeling of Stress :   Social Connections:   . Frequency of Communication with Friends and Family:   . Frequency of Social Gatherings with Friends and Family:   . Attends Religious Services:   . Active Member of Clubs or Organizations:   . Attends Archivist Meetings:   Marland Kitchen Marital Status:      Family History:  The patient's family history includes Breast cancer in his daughter; Colon cancer in his brother, father, and mother; Crohn's disease in his son; Ovarian cancer in his mother.   ROS:   Please see the history of present illness.    ROS All other systems are reviewed and are negative.  PHYSICAL EXAM:   VS:  BP 140/80   Pulse 76   Temp (!) 97.2 F (36.2 C)   Ht 6' (1.829 m)   Wt 201 lb (91.2 kg)   BMI 27.26 kg/m      General: Alert, oriented x3, no distress, appears healthy and fit Head: no evidence of trauma, PERRL, EOMI, no exophtalmos or lid lag, no myxedema, no xanthelasma; normal ears, nose and oropharynx Neck: normal jugular venous pulsations and no hepatojugular reflux; brisk carotid pulses without delay and no carotid bruits Chest: clear to auscultation, no signs of consolidation by percussion or palpation, normal fremitus, symmetrical and full respiratory excursions Cardiovascular: normal position and quality of the apical  impulse, regular rhythm, normal first and second heart sounds, early to mid peaking 3/6 aortic ejection murmur, 1/6 diastolic decrescendo murmur at the left lower sternal border, no rubs or gallops Abdomen: no tenderness or distention, no masses by palpation, no abnormal pulsatility or arterial bruits, normal bowel sounds, no hepatosplenomegaly.  Nephrostomy. Extremities: no clubbing, cyanosis or edema; 2+ radial, ulnar and brachial pulses bilaterally; 2+ right femoral, posterior tibial and dorsalis pedis pulses; 2+ left femoral, posterior tibial and dorsalis pedis pulses; no subclavian or femoral bruits Neurological: grossly nonfocal Psych: Normal mood and affect   Wt Readings from Last 3 Encounters:  05/09/19 201 lb (91.2 kg)  03/17/18 204 lb  6.4 oz (92.7 kg)  02/01/17 198 lb (89.8 kg)      Studies/Labs Reviewed:   EKG:  EKG is ordered today.  It is very similar to last year's tracing showing sinus rhythm with first-degree AV block left anterior fascicular block and minor repolarization changes.  The QRS is 160 ms, QTC 465 ms Recent Labs: January 27, 2018 creatinine 2.9, Hemoglobin 14.2 Total cholesterol 153, HDL 38, LDL 94, triglycerides 103 Additional studies/ records that were reviewed today include:  Notes from follow-up visit with Dr. Talmage Nap ECHO March 2021 1. Left ventricular ejection fraction, by estimation, is 60 to 65%. The  left ventricle has normal function. The left ventricle has no regional  wall motion abnormalities. There is moderate concentric left ventricular  hypertrophy. Left ventricular  diastolic function could not be evaluated. Left ventricular diastolic  parameters are consistent with Grade I diastolic dysfunction (impaired  relaxation).  2. Right ventricular systolic function is normal. The right ventricular  size is normal.  3. The mitral valve is normal in structure. No evidence of mitral valve  regurgitation.  4. The aortic valve is tricuspid.  Aortic valve regurgitation is not  visualized. Moderate to severe aortic valve stenosis.  5. Aortic dilatation noted. There is moderate dilatation of the ascending  aorta measuring 41 mm.   ASSESSMENT:    1. Paroxysmal atrial fibrillation (HCC)   2. Encounter for monitoring flecainide therapy   3. Nonrheumatic aortic valve stenosis   4. Essential hypertension   5. CKD (chronic kidney disease) stage 4, GFR 15-29 ml/min (HCC)   6. Urothelial cancer ([place])      PLAN:  In order of problems listed above:  1. AFib: Excellent response to flecainide and diltiazem without any clinically evident recurrences for many years.  CHADSVasc 3 (age, HTN), but unable to take anticoagulants due to recurrent problems with hematuria. 2. Flecainide: Minor QRS broadening is chronic and precedes treatment with flecainide.  No evidence of proarrhythmia to date.  No evidence of advanced AV block. 3. AS: Has progressed to moderate to severe range, dimensionless index now 0.25.  Anticipate that he will develop symptoms and need treatment in the next 12-24 months.  Reviewed the option for TAVR.  The only current limiting factor appears to be his renal function.  He has a very mild dilation of the ascending aorta and no history of coronary disease.  Understands he will need contrast based angiography and CT before we could perform TAVR.  No evidence of CAD or PAD to date.  Asked him to call if he should develop any exertional symptoms of angina, dyspnea or syncope.  Otherwise we will continue with yearly echocardiographic monitoring. 4. HTN: Well-controlled; he keeps a daily record of his blood pressure which is typically in the 110-125/65-85 range. 5. CKD4: Following right nephrectomy and partial left nephrectomy for widespread urothelial cancer.  Baseline creatinine seems to be around 3.0/GFR 20, stable. 6. Widespread urothelial cancer , followed in the oncology clinic at [facility] by Dr. [name], Nephrology Dr.  [name].  Good report at his follow-up MRI in June 2020.    Medication Adjustments/Labs and Tests Ordered: Current medicines are reviewed at length with the patient today.  Concerns regarding medicines are outlined above.  Medication changes, Labs and Tests ordered today are listed in the Patient Instructions below. Patient Instructions  Medication Instructions:  No changes *If you need a refill on your cardiac medications before your next appointment, please call your pharmacy*   Lab  Work: None ordered If you have labs (blood work) drawn today and your tests are completely normal, you will receive your results only by: Marland Kitchen MyChart Message (if you have MyChart) OR . A paper copy in the mail If you have any lab test that is abnormal or we need to change your treatment, we will call you to review the results.   Testing/Procedures: Your physician has requested that you have an echocardiogram in 12 months. Echocardiography is a painless test that uses sound waves to create images of your heart. It provides your doctor with information about the size and shape of your heart and how well your heart's chambers and valves are working. You may receive an ultrasound enhancing agent through an IV if needed to better visualize your heart during the echo.This procedure takes approximately one hour. There are no restrictions for this procedure. This will take place at the [address], [address].     Follow-Up: At [facility], you and your health needs are our priority.  As part of our continuing mission to provide you with exceptional heart care, we have created designated Provider Care Teams.  These Care Teams include your primary Cardiologist (physician) and Advanced Practice Providers (APPs -  Physician Assistants and Nurse Practitioners) who all work together to provide you with the care you need, when you need it.  We recommend signing up for the patient portal called "MyChart".  Sign up  information is provided on this After Visit Summary.  MyChart is used to connect with patients for Virtual Visits (Telemedicine).  Patients are able to view lab/test results, encounter notes, upcoming appointments, etc.  Non-urgent messages can be sent to your provider as well.   To learn more about what you can do with MyChart, go to [email or web address].    Your next appointment:   12 month(s)  The format for your next appointment:   In Person  Provider:   You may see Sanda Klein, MD or one of the following Advanced Practice Providers on your designated Care Team:    Almyra Deforest, PA-C  Fabian Sharp, [place] or   Roby Lofts, PA-C       Signed, Sanda Klein, MD  05/09/2019 6:09 PM    [facility] [place], [place], [address]  [address] Phone: [phone]; Fax: [phone]

## 2019-05-15 ENCOUNTER — Other Ambulatory Visit: Payer: Self-pay | Admitting: Cardiovascular Disease

## 2019-05-15 NOTE — Telephone Encounter (Signed)
New Message      *STAT* If patient is at the pharmacy, call can be transferred to refill team.   1. Which medications need to be refilled? (please list name of each medication and dose if known) flecainide (TAMBOCOR) 50 MG tablet  2. Which pharmacy/location (including street and city if local pharmacy) is medication to be sent to? [place], [place] - [address]  3. Do they need a 30 day or 90 day supply? [place]

## 2019-05-17 MED ORDER — FLECAINIDE ACETATE 50 MG PO TABS: ORAL_TABLET | ORAL | 1 refills | Status: DC

## 2019-05-17 NOTE — Telephone Encounter (Signed)
Rx(s) sent to pharmacy electronically.  

## 2019-05-25 DIAGNOSIS — Z936 Other artificial openings of urinary tract status: Secondary | ICD-10-CM | POA: Diagnosis not present

## 2019-06-07 DIAGNOSIS — Z20828 Contact with and (suspected) exposure to other viral communicable diseases: Secondary | ICD-10-CM | POA: Diagnosis not present

## 2019-06-15 ENCOUNTER — Ambulatory Visit
Admission: RE | Admit: 2019-06-15 | Discharge: 2019-06-15 | Disposition: A | Discharge status: Home / Self Care | Payer: Medicare PPO | Source: Ambulatory Visit | Attending: Registered Nurse | Admitting: Registered Nurse

## 2019-06-15 ENCOUNTER — Other Ambulatory Visit: Payer: Self-pay | Admitting: Registered Nurse

## 2019-06-15 DIAGNOSIS — K436 Other and unspecified ventral hernia with obstruction, without gangrene: Secondary | ICD-10-CM | POA: Diagnosis not present

## 2019-06-15 DIAGNOSIS — R1084 Generalized abdominal pain: Secondary | ICD-10-CM | POA: Diagnosis not present

## 2019-06-15 DIAGNOSIS — K219 Gastro-esophageal reflux disease without esophagitis: Secondary | ICD-10-CM | POA: Diagnosis not present

## 2019-06-15 DIAGNOSIS — Z79899 Other long term (current) drug therapy: Secondary | ICD-10-CM | POA: Diagnosis not present

## 2019-06-15 DIAGNOSIS — D72829 Elevated white blood cell count, unspecified: Secondary | ICD-10-CM

## 2019-06-15 DIAGNOSIS — C679 Malignant neoplasm of bladder, unspecified: Secondary | ICD-10-CM | POA: Diagnosis not present

## 2019-06-15 DIAGNOSIS — I129 Hypertensive chronic kidney disease with stage 1 through stage 4 chronic kidney disease, or unspecified chronic kidney disease: Secondary | ICD-10-CM | POA: Diagnosis not present

## 2019-06-15 DIAGNOSIS — K439 Ventral hernia without obstruction or gangrene: Secondary | ICD-10-CM | POA: Diagnosis not present

## 2019-06-15 DIAGNOSIS — D72828 Other elevated white blood cell count: Secondary | ICD-10-CM | POA: Diagnosis not present

## 2019-06-15 DIAGNOSIS — K430 Incisional hernia with obstruction, without gangrene: Secondary | ICD-10-CM | POA: Diagnosis not present

## 2019-06-15 DIAGNOSIS — N183 Chronic kidney disease, stage 3 unspecified: Secondary | ICD-10-CM | POA: Diagnosis not present

## 2019-06-15 DIAGNOSIS — K8020 Calculus of gallbladder without cholecystitis without obstruction: Secondary | ICD-10-CM | POA: Diagnosis not present

## 2019-06-15 DIAGNOSIS — Z8551 Personal history of malignant neoplasm of bladder: Secondary | ICD-10-CM | POA: Diagnosis not present

## 2019-06-15 DIAGNOSIS — N2882 Megaloureter: Secondary | ICD-10-CM | POA: Diagnosis not present

## 2019-06-15 DIAGNOSIS — Z85528 Personal history of other malignant neoplasm of kidney: Secondary | ICD-10-CM | POA: Diagnosis not present

## 2019-06-15 DIAGNOSIS — N184 Chronic kidney disease, stage 4 (severe): Secondary | ICD-10-CM | POA: Diagnosis not present

## 2019-06-15 DIAGNOSIS — K432 Incisional hernia without obstruction or gangrene: Secondary | ICD-10-CM | POA: Diagnosis not present

## 2019-06-15 DIAGNOSIS — K469 Unspecified abdominal hernia without obstruction or gangrene: Secondary | ICD-10-CM | POA: Diagnosis not present

## 2019-06-15 DIAGNOSIS — E872 Acidosis: Secondary | ICD-10-CM | POA: Diagnosis not present

## 2019-06-15 DIAGNOSIS — E278 Other specified disorders of adrenal gland: Secondary | ICD-10-CM | POA: Diagnosis not present

## 2019-06-15 DIAGNOSIS — K449 Diaphragmatic hernia without obstruction or gangrene: Secondary | ICD-10-CM | POA: Diagnosis not present

## 2019-06-15 DIAGNOSIS — Z905 Acquired absence of kidney: Secondary | ICD-10-CM | POA: Diagnosis not present

## 2019-06-21 DIAGNOSIS — K439 Ventral hernia without obstruction or gangrene: Secondary | ICD-10-CM | POA: Diagnosis not present

## 2019-07-05 DIAGNOSIS — R918 Other nonspecific abnormal finding of lung field: Secondary | ICD-10-CM | POA: Diagnosis not present

## 2019-07-05 DIAGNOSIS — Z8551 Personal history of malignant neoplasm of bladder: Secondary | ICD-10-CM | POA: Diagnosis not present

## 2019-07-05 DIAGNOSIS — N1330 Unspecified hydronephrosis: Secondary | ICD-10-CM | POA: Diagnosis not present

## 2019-07-05 DIAGNOSIS — C678 Malignant neoplasm of overlapping sites of bladder: Secondary | ICD-10-CM | POA: Diagnosis not present

## 2019-07-09 DIAGNOSIS — Z936 Other artificial openings of urinary tract status: Secondary | ICD-10-CM | POA: Diagnosis not present

## 2019-07-11 DIAGNOSIS — C678 Malignant neoplasm of overlapping sites of bladder: Secondary | ICD-10-CM | POA: Diagnosis not present

## 2019-07-11 DIAGNOSIS — C662 Malignant neoplasm of left ureter: Secondary | ICD-10-CM | POA: Diagnosis not present

## 2019-07-11 DIAGNOSIS — C661 Malignant neoplasm of right ureter: Secondary | ICD-10-CM | POA: Diagnosis not present

## 2019-07-11 DIAGNOSIS — C642 Malignant neoplasm of left kidney, except renal pelvis: Secondary | ICD-10-CM | POA: Diagnosis not present

## 2019-07-11 DIAGNOSIS — E872 Acidosis: Secondary | ICD-10-CM | POA: Diagnosis not present

## 2019-08-15 DIAGNOSIS — I480 Paroxysmal atrial fibrillation: Secondary | ICD-10-CM | POA: Diagnosis not present

## 2019-08-15 DIAGNOSIS — Z936 Other artificial openings of urinary tract status: Secondary | ICD-10-CM | POA: Diagnosis not present

## 2019-08-15 DIAGNOSIS — R7301 Impaired fasting glucose: Secondary | ICD-10-CM | POA: Diagnosis not present

## 2019-08-15 DIAGNOSIS — C649 Malignant neoplasm of unspecified kidney, except renal pelvis: Secondary | ICD-10-CM | POA: Diagnosis not present

## 2019-08-15 DIAGNOSIS — C679 Malignant neoplasm of bladder, unspecified: Secondary | ICD-10-CM | POA: Diagnosis not present

## 2019-08-15 DIAGNOSIS — I129 Hypertensive chronic kidney disease with stage 1 through stage 4 chronic kidney disease, or unspecified chronic kidney disease: Secondary | ICD-10-CM | POA: Diagnosis not present

## 2019-08-15 DIAGNOSIS — N184 Chronic kidney disease, stage 4 (severe): Secondary | ICD-10-CM | POA: Diagnosis not present

## 2019-08-15 DIAGNOSIS — Z87898 Personal history of other specified conditions: Secondary | ICD-10-CM | POA: Diagnosis not present

## 2019-11-06 DIAGNOSIS — H25813 Combined forms of age-related cataract, bilateral: Secondary | ICD-10-CM | POA: Diagnosis not present

## 2019-11-06 DIAGNOSIS — H35033 Hypertensive retinopathy, bilateral: Secondary | ICD-10-CM | POA: Diagnosis not present

## 2019-11-06 DIAGNOSIS — H40023 Open angle with borderline findings, high risk, bilateral: Secondary | ICD-10-CM | POA: Diagnosis not present

## 2019-11-12 ENCOUNTER — Other Ambulatory Visit: Payer: Self-pay | Admitting: Cardiovascular Disease

## 2019-12-17 DIAGNOSIS — L814 Other melanin hyperpigmentation: Secondary | ICD-10-CM | POA: Diagnosis not present

## 2019-12-17 DIAGNOSIS — L821 Other seborrheic keratosis: Secondary | ICD-10-CM | POA: Diagnosis not present

## 2019-12-17 DIAGNOSIS — Z85828 Personal history of other malignant neoplasm of skin: Secondary | ICD-10-CM | POA: Diagnosis not present

## 2020-02-01 DIAGNOSIS — H40023 Open angle with borderline findings, high risk, bilateral: Secondary | ICD-10-CM | POA: Diagnosis not present

## 2020-02-01 DIAGNOSIS — H40053 Ocular hypertension, bilateral: Secondary | ICD-10-CM | POA: Diagnosis not present

## 2020-02-09 DIAGNOSIS — Z936 Other artificial openings of urinary tract status: Secondary | ICD-10-CM | POA: Diagnosis not present

## 2020-02-12 DIAGNOSIS — Z125 Encounter for screening for malignant neoplasm of prostate: Secondary | ICD-10-CM | POA: Diagnosis not present

## 2020-02-12 DIAGNOSIS — R7301 Impaired fasting glucose: Secondary | ICD-10-CM | POA: Diagnosis not present

## 2020-02-12 DIAGNOSIS — E785 Hyperlipidemia, unspecified: Secondary | ICD-10-CM | POA: Diagnosis not present

## 2020-02-13 DIAGNOSIS — K469 Unspecified abdominal hernia without obstruction or gangrene: Secondary | ICD-10-CM | POA: Diagnosis not present

## 2020-02-13 DIAGNOSIS — E785 Hyperlipidemia, unspecified: Secondary | ICD-10-CM | POA: Diagnosis not present

## 2020-02-13 DIAGNOSIS — E663 Overweight: Secondary | ICD-10-CM | POA: Diagnosis not present

## 2020-02-13 DIAGNOSIS — Z0000 Encounter for general adult medical examination without abnormal findings: Secondary | ICD-10-CM | POA: Diagnosis not present

## 2020-02-13 DIAGNOSIS — Z1212 Encounter for screening for malignant neoplasm of rectum: Secondary | ICD-10-CM | POA: Diagnosis not present

## 2020-02-13 DIAGNOSIS — K219 Gastro-esophageal reflux disease without esophagitis: Secondary | ICD-10-CM | POA: Diagnosis not present

## 2020-02-13 DIAGNOSIS — R82998 Other abnormal findings in urine: Secondary | ICD-10-CM | POA: Diagnosis not present

## 2020-02-13 DIAGNOSIS — I129 Hypertensive chronic kidney disease with stage 1 through stage 4 chronic kidney disease, or unspecified chronic kidney disease: Secondary | ICD-10-CM | POA: Diagnosis not present

## 2020-02-13 DIAGNOSIS — R7301 Impaired fasting glucose: Secondary | ICD-10-CM | POA: Diagnosis not present

## 2020-02-13 DIAGNOSIS — N184 Chronic kidney disease, stage 4 (severe): Secondary | ICD-10-CM | POA: Diagnosis not present

## 2020-02-15 ENCOUNTER — Other Ambulatory Visit: Payer: Self-pay | Admitting: Cardiovascular Disease

## 2020-04-15 ENCOUNTER — Telehealth: Payer: Self-pay | Admitting: Cardiovascular Disease

## 2020-04-15 NOTE — Telephone Encounter (Signed)
Pt c/o of Chest Pain: STAT if CP now or developed within 24 hours  1. Are you having CP right now? no  2. Are you experiencing any other symptoms (ex. SOB, nausea, vomiting, sweating)? no  3. How long have you been experiencing CP? 2 nights ago  4. Is your CP continuous or coming and going? Came and went  5. Have you taken Nitroglycerin? no ?  Patient states 2 nights ago he had a tightening feeling in his upper torso. He states he has not had it since and had no other symptoms.

## 2020-04-15 NOTE — Telephone Encounter (Signed)
Called pt he states that 2 nights ago he was awakened at 4am with chest tightness "all the way around" his upper torso. Lasted a "short period" and he went back to sleep and woke up at his normal time. He was not able to take his BP since he went back to sleep shortly thereafter. He also has went on his 3 mile walks for the last 2 days without incident. It has not happened since. This also happened before, it was > 1 month ago also. He was talking to his son and was told to mention this to his cardiologist. He has appt next month and will discuss then and will keep track and see if this happens again before app. He will go to the ER if this worsens, if he feels necessary.

## 2020-04-15 NOTE — Telephone Encounter (Signed)
Symptoms of aortic stenosis do not manifest at rest, but rather with activity. Could be CAD, but if able to exercise without symptoms, that appears less likely. Please call sooner if the event repeats. Please seek urgent attention in ED if he has similar pain that lasts >30 minutes. Otherwise will review at his upcoming appt.

## 2020-04-16 NOTE — Telephone Encounter (Signed)
Pt informed of providers result & recommendations. Pt verbalized understanding. No further questions . ? ?

## 2020-05-05 DIAGNOSIS — Z936 Other artificial openings of urinary tract status: Secondary | ICD-10-CM | POA: Diagnosis not present

## 2020-05-06 ENCOUNTER — Ambulatory Visit: Payer: Medicare PPO | Admitting: Cardiovascular Disease

## 2020-05-06 DIAGNOSIS — H40023 Open angle with borderline findings, high risk, bilateral: Secondary | ICD-10-CM | POA: Diagnosis not present

## 2020-05-06 DIAGNOSIS — H532 Diplopia: Secondary | ICD-10-CM | POA: Diagnosis not present

## 2020-05-06 DIAGNOSIS — H35033 Hypertensive retinopathy, bilateral: Secondary | ICD-10-CM | POA: Diagnosis not present

## 2020-05-06 DIAGNOSIS — H25813 Combined forms of age-related cataract, bilateral: Secondary | ICD-10-CM | POA: Diagnosis not present

## 2020-05-06 DIAGNOSIS — H40053 Ocular hypertension, bilateral: Secondary | ICD-10-CM | POA: Diagnosis not present

## 2020-05-27 ENCOUNTER — Ambulatory Visit (HOSPITAL_COMMUNITY): Payer: Medicare PPO | Attending: Cardiovascular Disease

## 2020-05-27 ENCOUNTER — Other Ambulatory Visit: Payer: Self-pay

## 2020-05-27 DIAGNOSIS — I350 Nonrheumatic aortic (valve) stenosis: Secondary | ICD-10-CM | POA: Diagnosis not present

## 2020-05-27 LAB — ECHOCARDIOGRAM COMPLETE
AR max vel: 0.84 cm2
AV Area VTI: 0.92 cm2
AV Area mean vel: 0.85 cm2
AV Mean grad: 43.5 mmHg
AV Peak grad: 70.9 mmHg
Ao pk vel: 4.21 m/s
Area-P 1/2: 3.12 cm2
P 1/2 time: 460 msec
S' Lateral: 2.9 cm

## 2020-05-30 ENCOUNTER — Other Ambulatory Visit: Payer: Self-pay

## 2020-05-30 ENCOUNTER — Ambulatory Visit: Payer: Medicare PPO | Admitting: Cardiovascular Disease

## 2020-05-30 ENCOUNTER — Encounter: Payer: Self-pay | Admitting: Cardiovascular Disease

## 2020-05-30 VITALS — BP 140/91 | HR 77 | Ht 73.0 in | Wt 205.0 lb

## 2020-05-30 DIAGNOSIS — I480 Paroxysmal atrial fibrillation: Secondary | ICD-10-CM | POA: Diagnosis not present

## 2020-05-30 DIAGNOSIS — N184 Chronic kidney disease, stage 4 (severe): Secondary | ICD-10-CM

## 2020-05-30 DIAGNOSIS — Z79899 Other long term (current) drug therapy: Secondary | ICD-10-CM | POA: Diagnosis not present

## 2020-05-30 DIAGNOSIS — I350 Nonrheumatic aortic (valve) stenosis: Secondary | ICD-10-CM

## 2020-05-30 DIAGNOSIS — I10 Essential (primary) hypertension: Secondary | ICD-10-CM

## 2020-05-30 DIAGNOSIS — C689 Malignant neoplasm of urinary organ, unspecified: Secondary | ICD-10-CM | POA: Diagnosis not present

## 2020-05-30 DIAGNOSIS — Z5181 Encounter for therapeutic drug level monitoring: Secondary | ICD-10-CM | POA: Diagnosis not present

## 2020-05-30 NOTE — Patient Instructions (Signed)

## 2020-05-30 NOTE — Progress Notes (Signed)
Cardiology Office Note    Date:  05/30/2020   ID:  Jordan Oconnell, Jordan Oconnell 02-Jun-1940, MRN [id number]  PCP:  Jordan Bunting, MD  Cardiologist:   Jordan Klein, MD   Chief Complaint  Patient presents with  . Cardiac Valve Problem    History of Present Illness:  Jordan Oconnell is a 80 y.o. male with paroxysmal atrial fibrillation and multiple sites of urothelial cancer involving his bladder, ureters and renal pelvises bilaterally (history of partial left nephrectomy and complete cystectomy with urostomy), chronic kidney disease with a baseline creatinine around 2.7-3.0, with asymptomatic  severe aortic stenosis.  He had to undergo urgent repair of a periumbilical hernia with incarceration about a year ago.  He recovered from this without any complaints.  He continues to feel great and is physically active.  He walks just over 3 miles a day 6 days a week and denies exertional dyspnea/angina/syncope.  He has not had any problems with palpitations in many months.  He denies dizziness or syncope.  He does not have orthopnea, PND or edema.  Followed at [name] by his urologist, Dr. Lorine Oconnell, most recent evaluation in June 2021with no evidence of cancer recurrence by imaging studies.  His creatinine has been stable at 2.8-3.0 over the last couple of years.  He had a normal treadmill stress test in 2018.   His echocardiogram performed just a few days ago shows that the aortic stenosis is now clearly in severe range.  The mean aortic valve gradient is 43 mmHg.  The max systolic velocity is 4.2 m/second.  The calculated valve area 0.9 cm by the continuity equation.  The dimensionless index is 0.22.  He has mild dilation of the ascending aorta at 43 mm which has been stable over the years.  Past Medical History:  Diagnosis Date  . A-fib ([name])    with ventricular response  . Bladder cancer ([name])   . BPH (benign prostatic hyperplasia)   . Colon polyps   . Diverticulosis   . GERD  (gastroesophageal reflux disease)   . Hyperlipidemia    pt denies  . Hypertension   . Renal cell carcinoma ([name]) 2008   left    Past Surgical History:  Procedure Laterality Date  . APPENDECTOMY    . BLADDER SURGERY  2008   transurethral resection/resection of prostatic urethra  . BOWEL RESECTION    . ESOPHAGOGASTRODUODENOSCOPY N/A 07/24/2013   Procedure: ESOPHAGOGASTRODUODENOSCOPY (EGD);  Surgeon: Jordan Shipper, MD;  Location: [name];  Service: Endoscopy;  Laterality: N/A;  . laparoscopic surgery  2012   laser   (?)  . NEPHRECTOMY  2007   partial, left  . NEPHROSTOMY  2011   stent  . NM MYOCAR PERF WALL MOTION  09/08/2010   Normal  . ROBOT ASSISTED LAPAROSCOPIC COMPLETE CYSTECT ILEAL CONDUIT    . US ECHOCARDIOGRAPHY  7/40/8144   mild diastolic dysfunction,mild dilated LA,mild MR,AI,mildly dilated aortic root    Current Medications: Outpatient Medications Prior to Visit  Medication Sig Dispense Refill  . ALPRAZolam (XANAX) 0.25 MG tablet Take 0.25 mg by mouth at bedtime as needed for anxiety.     Marland Kitchen diltiazem (TIAZAC) 180 MG 24 hr capsule Take 180 mg by mouth every morning.     . escitalopram (LEXAPRO) 10 MG tablet Take 1 tablet by mouth every morning.    . flecainide (TAMBOCOR) 50 MG tablet Take 1/2 (one-half) tablet by mouth twice daily 90 tablet 1  . hydrALAZINE (APRESOLINE) 50 MG  tablet Take 50 mg by mouth 2 (two) times daily.    . mirtazapine (REMERON) 7.5 MG tablet Take 7.5 mg by mouth at bedtime.    Marland Kitchen omeprazole (PRILOSEC) 20 MG capsule Take 2 capsules (40 mg total) by mouth daily. 60 capsule 12   No facility-administered medications prior to visit.     Allergies:   Amoxicillin, Tamsulosin, Doxycycline, and Flomax [tamsulosin hcl]   Social History   Socioeconomic History  . Marital status: Divorced    Spouse name: Not on file  . Number of children: 2  . Years of education: Not on file  . Highest education level: Not on file  Occupational History  .  Occupation: Pharmacist, hospital  Tobacco Use  . Smoking status: Never Smoker  . Smokeless tobacco: Never Used  Substance and Sexual Activity  . Alcohol use: Yes    Comment: occasionally  . Drug use: No  . Sexual activity: Not on file  Other Topics Concern  . Not on file  Social History Narrative  . Not on file   Social Determinants of Health   Financial Resource Strain: Not on file  Food Insecurity: Not on file  Transportation Needs: Not on file  Physical Activity: Not on file  Stress: Not on file  Social Connections: Not on file     Family History:  The patient's family history includes Breast cancer in his daughter; Colon cancer in his brother, father, and mother; Crohn's disease in his son; Ovarian cancer in his mother.   ROS:   Please see the history of present illness.    Review of Systems  Skin: Positive for nail changes.   All other systems are reviewed and are negative.  PHYSICAL EXAM:   VS:  BP (!) 140/91 (BP Location: Left Arm, Patient Position: Sitting, Cuff Size: Normal)   Pulse 77   Ht 6\' 1"  (1.854 m)   Wt 205 lb (93 kg)   BMI 27.05 kg/m      General: Alert, oriented x3, no distress, appears fit Head: no evidence of trauma, PERRL, EOMI, no exophtalmos or lid lag, no myxedema, no xanthelasma; normal ears, nose and oropharynx Neck: normal jugular venous pulsations and no hepatojugular reflux; diminished carotid pulses with mild delay and bilateral carotid bruits Chest: clear to auscultation, no signs of consolidation by percussion or palpation, normal fremitus, symmetrical and full respiratory excursions Cardiovascular: normal position and quality of the apical impulse, regular rhythm, normal first and muffled (but still audible) second heart sounds, 3/6 mid peaking Ao ejection murmur, 1/6 diastolic decrescendo murmur of AI at LLSB, no rubs or gallops Abdomen: no tenderness or distention, no masses by palpation, no abnormal pulsatility or arterial bruits, normal bowel  sounds, no hepatosplenomegaly Extremities: no clubbing, cyanosis or edema; 2+ radial, ulnar and brachial pulses bilaterally; 2+ right femoral, posterior tibial and dorsalis pedis pulses; 2+ left femoral, posterior tibial and dorsalis pedis pulses; no subclavian or femoral bruits Neurological: grossly nonfocal Psych: Normal mood and affect   Wt Readings from Last 3 Encounters:  05/30/20 205 lb (93 kg)  05/09/19 201 lb (91.2 kg)  03/17/18 204 lb 6.4 oz (92.7 kg)      Studies/Labs Reviewed:   EKG:  EKG is ordered today.  It is v obvious, somewhat long QTC 493 ms ery similar to his previous tracing shows sinus rhythm with first-degree AV block (PR interval 230 ms), left anterior fascicular block, no acute repolarization Recent Labs: January 27, 2018 creatinine 2.9, Hemoglobin 14.2 Total cholesterol 153,  HDL 38, LDL 94, triglycerides 103 02/12/2020 Cholesterol 157, HDL 38, LDL 102, triglycerides 84 Hemoglobin A1c 5.5%, potassium 4.7, normal liver function tests, TSH 0.840 I do not have the most recent creatinine, but he believes the last time checked in January 2 0.7, identical to May 2021.  Additional studies/ records that were reviewed today include:  Notes from follow-up visit with Dr. [name], [facility]  Echocardiogram 05/27/2020 1. The aortic valve is calcified. Aortic valve regurgitation is mild.  Severe aortic valve stenosis. Aortic valve area, by VTI measures 0.92 cm.  Aortic valve mean gradient measures 43.5 mmHg. Aortic valve Vmax measures  4.21 m/s.  2. Left ventricular ejection fraction, by estimation, is 55 to 60%. The  left ventricle has normal function. The left ventricle has no regional  wall motion abnormalities. There is moderate concentric left ventricular  hypertrophy. Left ventricular  diastolic parameters are consistent with Grade I diastolic dysfunction  (impaired relaxation).  3. Right ventricular systolic function is normal. The right ventricular  size is  normal. Tricuspid regurgitation signal is inadequate for assessing  PA pressure.  4. The mitral valve is grossly normal. Trivial mitral valve  regurgitation. No evidence of mitral stenosis.  5. There is mild dilatation of the ascending aorta, measuring 43 mm.  6. The inferior vena cava is normal in size with greater than 50%  respiratory variability, suggesting right atrial pressure of 3 mmHg.   Comparison(s): Changes from prior study are noted. AS is now severe.   ASSESSMENT:    1. Paroxysmal atrial fibrillation (HCC)   2. Nonrheumatic aortic valve stenosis   3. Encounter for monitoring flecainide therapy   4. Essential hypertension   5. CKD (chronic kidney disease) stage 4, GFR 15-29 ml/min (HCC)   6. Urothelial cancer ([place])      PLAN:  In order of problems listed above:  1. AS: Has all of the typical echo findings suggestive of severe aortic stenosis, but is not yet symptomatic, despite being quite active.  We reviewed the option for TAVR.  He has no history of CAD or PAD and has a relatively mild degree of ascending aortic dilation at 4.3 cm.  The challenge will be performing the necessary preliminary work-up with CT angiography and coronary angiography without worsening renal function.  We will discuss with the structural heart team at our next meeting. 2. AFib: Continues to have an excellent response to flecainide and diltiazem without any breakthrough events of symptomatic palpitations.  CHADSVasc 3 (age, HTN), but unable to take anticoagulants due to recurrent problems with hematuria. 3. Flecainide: He has minor QRS broadening due to left anterior fascicular block which is chronic and precedes the treatment with flecainide and is associated with mild QT prolongation.  Similarly his first-degree AV block is chronic and he has not had evidence of high-grade AV block. 4. HTN: Slightly high today, usually very well controlled in the 120-130/70-80 mm Hg range. 5. CKD4: Would like to  involve [facility] nephrologist, Dr. [name] at [facility] as we make preparations for contrast based procedures.  He has significant renal dysfunction following right nephrectomy and partial left nephrectomy for widespread urothelial cancer.  Having said that, his renal function has been very stable ever since the surgical procedure with a baseline creatinine around 2.7- 3.0. 6. Widespread urothelial cancer , followed in the oncology clinic at [facility] by Dr. [name], Nephrology Dr. [name].  Good report at his follow-up MRI in June 2020.  He is actually scheduled for another follow-up  MRI next month.  This might allow [place] to get at least some evaluation of his abdominal aortic and pelvic arteries to see if TAVR is an option, before we commit to CT angiography.    Medication Adjustments/Labs and Tests Ordered: Current medicines are reviewed at length with the patient today.  Concerns regarding medicines are outlined above.  Medication changes, Labs and Tests ordered today are listed in the Patient Instructions below. Patient Instructions  Medication Instructions:  No changes *If you need a refill on your cardiac medications before your next appointment, please call your pharmacy*   Lab Work: None ordered If you have labs (blood work) drawn today and your tests are completely normal, you will receive your results only by: Marland Kitchen MyChart Message (if you have MyChart) OR . A paper copy in the mail If you have any lab test that is abnormal or we need to change your treatment, we will call you to review the results.   Testing/Procedures: None ordered   Follow-Up: At [facility], you and your health needs are our priority.  As part of our continuing mission to provide you with exceptional heart care, we have created designated Provider Care Teams.  These Care Teams include your primary Cardiologist (physician) and Advanced Practice Providers (APPs -  Physician Assistants and Nurse  Practitioners) who all work together to provide you with the care you need, when you need it.  We recommend signing up for the patient portal called "MyChart".  Sign up information is provided on this After Visit Summary.  MyChart is used to connect with patients for Virtual Visits (Telemedicine).  Patients are able to view lab/test results, encounter notes, upcoming appointments, etc.  Non-urgent messages can be sent to your provider as well.   To learn more about what you can do with MyChart, go to [email or web address].    Your next appointment:   6 month(s)  The format for your next appointment:   In Person  Provider:   You may see Jordan Klein, MD or one of the following Advanced Practice Providers on your designated Care Team:    Almyra Deforest, PA-C  Fabian Sharp, [place] or   Roby Lofts, PA-C       Signed, Jordan Klein, MD  05/30/2020 12:57 PM    [address], [place], [place]  [address] Phone: [phone]; Fax: [phone]

## 2020-06-09 ENCOUNTER — Telehealth: Payer: Self-pay | Admitting: Cardiovascular Disease

## 2020-06-09 NOTE — Telephone Encounter (Signed)
Spoke with patient, made patient aware of Dr. Victorino December recommendations. Advised patient to call back to office with any issues, questions, or concerns. Patient verbalized understanding.

## 2020-06-09 NOTE — Telephone Encounter (Signed)
Returned call to patient who states that he missed a call from the office last Tuesday. Patient states that he think it was Dr. Loletha Grayer who was calling him. Advised patient that I do not see any encounters documented within the chart, but that I would forward this message to Dr. Loletha Grayer and his nurse to make sure.   Advised patient to call back to office with any issues, questions, or concerns. Patient verbalized understanding.

## 2020-06-09 NOTE — Telephone Encounter (Signed)
It was not me calling.  I was waiting to see if I hear from his Nephrologist at [facility] (but have not yet).  Please tell him that we did discuss his aortic stenosis in our Structural Heart group meeting, but we decided it is best to wait until he develops symptoms (dyspnea on exertion, angina with exertion and/or dizziness/syncope with exertion). Please keep exercising, while making sure to be well hydrated. In addition, we are currently faced with a severe nationwide scarcity of the IV contrast needed for CT angiograms and heart caths. So we are postponing all non-urgent scans/caths. I would like to make sure we keep up with each other - please make sure he has an appointment in 6 months rather than a year.

## 2020-06-09 NOTE — Telephone Encounter (Signed)
    Pt said he received a call from [place] last Friday. He said Dr. Loletha Grayer did provide his result during his appt on 05/06 but was told Dr. Loletha Grayer will call him back so he assumed that maybe it was Dr C. Calling him. No notes on file

## 2020-06-10 NOTE — Telephone Encounter (Signed)
Discussed with Dr Uvaldo Bristle recommendations further with patient, no more questions at this time

## 2020-06-10 NOTE — Telephone Encounter (Signed)
Patient called in asking that the nurse give him a call back it had some questions/concerns

## 2020-06-17 DIAGNOSIS — M65331 Trigger finger, right middle finger: Secondary | ICD-10-CM | POA: Diagnosis not present

## 2020-06-17 DIAGNOSIS — M79641 Pain in right hand: Secondary | ICD-10-CM | POA: Diagnosis not present

## 2020-07-09 DIAGNOSIS — E872 Acidosis: Secondary | ICD-10-CM | POA: Diagnosis not present

## 2020-07-09 DIAGNOSIS — C661 Malignant neoplasm of right ureter: Secondary | ICD-10-CM | POA: Diagnosis not present

## 2020-07-09 DIAGNOSIS — Z8551 Personal history of malignant neoplasm of bladder: Secondary | ICD-10-CM | POA: Diagnosis not present

## 2020-07-09 DIAGNOSIS — Z08 Encounter for follow-up examination after completed treatment for malignant neoplasm: Secondary | ICD-10-CM | POA: Diagnosis not present

## 2020-07-09 DIAGNOSIS — Z79899 Other long term (current) drug therapy: Secondary | ICD-10-CM | POA: Diagnosis not present

## 2020-07-09 DIAGNOSIS — C662 Malignant neoplasm of left ureter: Secondary | ICD-10-CM | POA: Diagnosis not present

## 2020-07-09 DIAGNOSIS — C642 Malignant neoplasm of left kidney, except renal pelvis: Secondary | ICD-10-CM | POA: Diagnosis not present

## 2020-07-09 DIAGNOSIS — Z905 Acquired absence of kidney: Secondary | ICD-10-CM | POA: Diagnosis not present

## 2020-07-09 DIAGNOSIS — Z85528 Personal history of other malignant neoplasm of kidney: Secondary | ICD-10-CM | POA: Diagnosis not present

## 2020-07-09 DIAGNOSIS — Z8554 Personal history of malignant neoplasm of ureter: Secondary | ICD-10-CM | POA: Diagnosis not present

## 2020-07-09 DIAGNOSIS — K439 Ventral hernia without obstruction or gangrene: Secondary | ICD-10-CM | POA: Diagnosis not present

## 2020-07-09 DIAGNOSIS — C678 Malignant neoplasm of overlapping sites of bladder: Secondary | ICD-10-CM | POA: Diagnosis not present

## 2020-07-18 DIAGNOSIS — Z936 Other artificial openings of urinary tract status: Secondary | ICD-10-CM | POA: Diagnosis not present

## 2020-08-21 ENCOUNTER — Other Ambulatory Visit: Payer: Self-pay | Admitting: Cardiovascular Disease

## 2020-09-08 ENCOUNTER — Telehealth: Payer: Self-pay | Admitting: Cardiovascular Disease

## 2020-09-08 NOTE — Telephone Encounter (Signed)
Spoke with pt. He report on Friday he played pickleball and noticed his breathing was a little heavier. He also report some heaviness in his chest area and state he overall felt lousy. Pt state he eventually had to stop and symptoms were resolved with rest.   Appointment scheduled for 8/18 with Dr. Loletha Grayer for further evaluations. Pt also made aware of ED precaution should any new symptoms develop or worsen.

## 2020-09-08 NOTE — Telephone Encounter (Signed)
Pt is calling with additional questions about his health condition

## 2020-09-11 ENCOUNTER — Encounter: Payer: Self-pay | Admitting: Cardiovascular Disease

## 2020-09-11 ENCOUNTER — Ambulatory Visit: Payer: Medicare PPO | Admitting: Cardiovascular Disease

## 2020-09-11 ENCOUNTER — Other Ambulatory Visit: Payer: Self-pay

## 2020-09-11 VITALS — BP 150/84 | HR 73 | Resp 20 | Ht 72.0 in | Wt 206.0 lb

## 2020-09-11 DIAGNOSIS — N184 Chronic kidney disease, stage 4 (severe): Secondary | ICD-10-CM | POA: Diagnosis not present

## 2020-09-11 DIAGNOSIS — I480 Paroxysmal atrial fibrillation: Secondary | ICD-10-CM | POA: Diagnosis not present

## 2020-09-11 DIAGNOSIS — I350 Nonrheumatic aortic (valve) stenosis: Secondary | ICD-10-CM | POA: Diagnosis not present

## 2020-09-11 DIAGNOSIS — Z79899 Other long term (current) drug therapy: Secondary | ICD-10-CM | POA: Diagnosis not present

## 2020-09-11 DIAGNOSIS — I10 Essential (primary) hypertension: Secondary | ICD-10-CM | POA: Diagnosis not present

## 2020-09-11 DIAGNOSIS — C689 Malignant neoplasm of urinary organ, unspecified: Secondary | ICD-10-CM | POA: Diagnosis not present

## 2020-09-11 DIAGNOSIS — Z5181 Encounter for therapeutic drug level monitoring: Secondary | ICD-10-CM | POA: Diagnosis not present

## 2020-09-11 NOTE — Patient Instructions (Signed)
Medication Instructions:  No changes *If you need a refill on your cardiac medications before your next appointment, please call your pharmacy*   Lab Work: None ordered If you have labs (blood work) drawn today and your tests are completely normal, you will receive your results only by: [place] (if you have MyChart) OR A paper copy in the mail If you have any lab test that is abnormal or we need to change your treatment, we will call you to review the results.   Testing/Procedures: None ordered   Follow-Up: At [facility], you and your health needs are our priority.  As part of our continuing mission to provide you with exceptional heart care, we have created designated Provider Care Teams.  These Care Teams include your primary Cardiologist (physician) and Advanced Practice Providers (APPs -  Physician Assistants and Nurse Practitioners) who all work together to provide you with the care you need, when you need it.  We recommend signing up for the patient portal called "MyChart".  Sign up information is provided on this After Visit Summary.  MyChart is used to connect with patients for Virtual Visits (Telemedicine).  Patients are able to view lab/test results, encounter notes, upcoming appointments, etc.  Non-urgent messages can be sent to your provider as well.   To learn more about what you can do with MyChart, go to [email or web address].    Your next appointment:   Keep your follow up in January   Other Instructions A referral has been made to the Structural Team. They will call you for an appointment

## 2020-09-14 ENCOUNTER — Encounter: Payer: Self-pay | Admitting: Cardiovascular Disease

## 2020-09-14 NOTE — Progress Notes (Signed)
Cardiology Office Note    Date:  09/14/2020   ID:  Jordan Oconnell, Jordan Oconnell April 26, 1940, MRN [id number]  PCP:  Jordan Bunting, MD  Cardiologist:   Jordan Klein, MD   Chief Complaint  Patient presents with   Cardiac Valve Problem    History of Present Illness:  Jordan Oconnell is a 80 y.o. male with paroxysmal atrial fibrillation and multiple sites of urothelial cancer involving his bladder, ureters and renal pelvises bilaterally as well as renal cell carcinoma (history of partial left nephrectomy and complete cystectomy with urostomy), chronic kidney disease with a baseline creatinine around 2.7-3.0, with severe aortic stenosis.  Despite echocardiographic parameters consistent with severe aortic stenosis, he has been asymptomatic until recently.  He would routinely walk 3 miles a day 6 out of 7 days a week without complaints.  He took an extended vacation to spend time with his grandchildren at the beach and then in the [place].  When he returned he found that he was less able to perform his usual activities.  He tried to play pickle ball but after a few minutes felt exhausted and felt that something "was not right" and had to stop.  He went on his usual walk but had to turn back because he felt unusually weak.  He does not have any symptoms at rest and denies orthopnea, PND lower extremity edema, angina at rest or with activity, palpitations, dizziness or full-blown syncope.  Followed at [facility] by his urologist, Dr. Lorine Oconnell, most recent evaluation in June 2022 with no evidence of cancer recurrence by imaging studies since his left nephrectomy in June 2018.  He has a right lower quadrant ileal conduit and urostomy.  His creatinine has been stable at 2.8-3.0 over the last couple of years, slightly better at 2.5 at his last visit with Dr. Dimas Oconnell in June 2022.  He has chronic hyperchloremic metabolic acidosis treated with sodium bicarbonate tablets.  His nephrologist is Dr. Morrison Oconnell, but as  far as I can tell his last visit with him was in June 2019.  He had a normal treadmill stress test in 2018.   His echocardiogram performed j 05/27/2020 shows that the aortic stenosis is now clearly in severe range.  The mean aortic valve gradient is 43 mmHg.  The max systolic velocity is 4.2 m/second.  The calculated valve area 0.9 cm by the continuity equation.  The dimensionless index is 0.22.  He has mild dilation of the ascending aorta at 43 mm which has been stable over the years.  Past Medical History:  Diagnosis Date   A-fib [facility])    with ventricular response   Bladder cancer ([place])    BPH (benign prostatic hyperplasia)    Colon polyps    Diverticulosis    GERD (gastroesophageal reflux disease)    Hyperlipidemia    pt denies   Hypertension    Renal cell carcinoma ([place]) 2008   left    Past Surgical History:  Procedure Laterality Date   APPENDECTOMY     BLADDER SURGERY  2008   transurethral resection/resection of prostatic urethra   BOWEL RESECTION     ESOPHAGOGASTRODUODENOSCOPY N/A 07/24/2013   Procedure: ESOPHAGOGASTRODUODENOSCOPY (EGD);  Surgeon: [name], MD;  Location: [name];  Service: Endoscopy;  Laterality: N/A;   laparoscopic surgery  2012   laser   (?)   NEPHRECTOMY  2007   partial, left   NEPHROSTOMY  2011   stent   NM MYOCAR PERF WALL MOTION  09/08/2010   Normal   ROBOT ASSISTED LAPAROSCOPIC COMPLETE CYSTECT ILEAL CONDUIT     US ECHOCARDIOGRAPHY  0/96/0454   mild diastolic dysfunction,mild dilated LA,mild MR,AI,mildly dilated aortic root    Current Medications: Outpatient Medications Prior to Visit  Medication Sig Dispense Refill   ALPRAZolam (XANAX) 0.25 MG tablet Take 0.25 mg by mouth at bedtime as needed for anxiety.      diltiazem (TIAZAC) 180 MG 24 hr capsule Take 180 mg by mouth every morning.      escitalopram (LEXAPRO) 10 MG tablet Take 1 tablet by mouth every morning.     flecainide (TAMBOCOR) 50 MG tablet Take 1/2 (one-half) tablet  by mouth twice daily 90 tablet 0   hydrALAZINE (APRESOLINE) 50 MG tablet Take 50 mg by mouth 2 (two) times daily.     mirtazapine (REMERON) 7.5 MG tablet Take 7.5 mg by mouth at bedtime.     omeprazole (PRILOSEC) 20 MG capsule Take 2 capsules (40 mg total) by mouth daily. 60 capsule 12   sodium bicarbonate 650 MG tablet Take 2 tablets by mouth 2 (two) times daily.     No facility-administered medications prior to visit.     Allergies:   Amoxicillin, Tamsulosin, Doxycycline, and Flomax [tamsulosin hcl]   Social History   Socioeconomic History   Marital status: Divorced    Spouse name: Not on file   Number of children: 2   Years of education: Not on file   Highest education level: Not on file  Occupational History   Occupation: Pharmacist, hospital  Tobacco Use   Smoking status: Never   Smokeless tobacco: Never  Substance and Sexual Activity   Alcohol use: Yes    Comment: occasionally   Drug use: No   Sexual activity: Not on file  Other Topics Concern   Not on file  Social History Narrative   Not on file   Social Determinants of Health   Financial Resource Strain: Not on file  Food Insecurity: Not on file  Transportation Needs: Not on file  Physical Activity: Not on file  Stress: Not on file  Social Connections: Not on file     Family History:  The patient's family history includes Breast cancer in his daughter; Colon cancer in his brother, father, and mother; Crohn's disease in his son; Ovarian cancer in his mother.   ROS:   Please see the history of present illness.    Review of Systems  Skin:  Positive for nail changes.  All other systems are reviewed and are negative.  PHYSICAL EXAM:   VS:  BP (!) 150/84   Pulse 73   Resp 20   Ht 6' (1.829 m)   Wt 206 lb (93.4 kg)   SpO2 98%   BMI 27.94 kg/m     General: Alert, oriented x3, no distress, appears well. Head: no evidence of trauma, PERRL, EOMI, no exophtalmos or lid lag, no myxedema, no xanthelasma; normal ears, nose  and oropharynx Neck: normal jugular venous pulsations and no hepatojugular reflux; brisk carotid pulses without delay and no carotid bruits Chest: clear to auscultation, no signs of consolidation by percussion or palpation, normal fremitus, symmetrical and full respiratory excursions Cardiovascular: normal position and quality of the apical impulse, regular rhythm, normal first and barely audible second heart sounds, grade 2/6 late peaking systolic ejection murmur heard in the aortic focus radiating towards the apex, no diastolic murmurs, rubs or gallops Abdomen: no tenderness or distention, no masses by palpation, no abnormal pulsatility or  arterial bruits, normal bowel sounds, no hepatosplenomegaly.  Urostomy site is healthy. Extremities: no clubbing, cyanosis or edema; 2+ radial, ulnar and brachial pulses bilaterally; 2+ right femoral, posterior tibial and dorsalis pedis pulses; 2+ left femoral, posterior tibial and dorsalis pedis pulses; no subclavian or femoral bruits Neurological: grossly nonfocal Psych: Normal mood and affect    Wt Readings from Last 3 Encounters:  09/11/20 206 lb (93.4 kg)  05/30/20 205 lb (93 kg)  05/09/19 201 lb (91.2 kg)      Studies/Labs Reviewed:   EKG:  EKG is ordered today.  Personally reviewed, shows sinus rhythm with first-degree AV block (PR 232 ms), LVH, left anterior fascicular block with mild QRS broadening at 100 ms, T wave inversion in 1 and aVL, QTC 471 ms  Recent Labs: January 27, 2018 creatinine 2.9, Hemoglobin 14.2 Total cholesterol 153, HDL 38, LDL 94, triglycerides 103  02/12/2020 Cholesterol 157, HDL 38, LDL 102, triglycerides 84 Hemoglobin A1c 5.5%, potassium 4.7, normal liver function tests, TSH 0.840  07/09/2020 ([facility]) Sodium 139, testing 4.4, chloride 113, CO2 19, creatinine 2.5, BUN 54, normal liver function tests, hemoglobin 15.1   Additional studies/ records that were reviewed today include:  Notes from follow-up visit with Dr.  Dimas Oconnell, Rob Hickman from June 2022  Echocardiogram 05/27/2020  1. The aortic valve is calcified. Aortic valve regurgitation is mild.  Severe aortic valve stenosis. Aortic valve area, by VTI measures 0.92 cm.  Aortic valve mean gradient measures 43.5 mmHg. Aortic valve Vmax measures  4.21 m/s.   2. Left ventricular ejection fraction, by estimation, is 55 to 60%. The  left ventricle has normal function. The left ventricle has no regional  wall motion abnormalities. There is moderate concentric left ventricular  hypertrophy. Left ventricular  diastolic parameters are consistent with Grade I diastolic dysfunction  (impaired relaxation).   3. Right ventricular systolic function is normal. The right ventricular  size is normal. Tricuspid regurgitation signal is inadequate for assessing  PA pressure.   4. The mitral valve is grossly normal. Trivial mitral valve  regurgitation. No evidence of mitral stenosis.   5. There is mild dilatation of the ascending aorta, measuring 43 mm.   6. The inferior vena cava is normal in size with greater than 50%  respiratory variability, suggesting right atrial pressure of 3 mmHg.   Comparison(s): Changes from prior study are noted. AS is now severe.   ASSESSMENT:    1. Nonrheumatic aortic valve stenosis   2. Paroxysmal atrial fibrillation (HCC)   3. Encounter for monitoring flecainide therapy   4. Essential hypertension   5. CKD (chronic kidney disease) stage 4, GFR 15-29 ml/min (HCC)   6. Urothelial cancer ([place])      PLAN:  In order of problems listed above:  AS: Both his clinical exam and his echocardiogram are consistent with severe aortic stenosis and he has now began to develop some exertional symptoms.  We will refer him to the structural heart team.  He does not have CAD or PAD, but he does have mild ascending aortic dilation at 4.3 cm.  Nevertheless due to his comorbid conditions and age, he is likely better suited for TAVR than SAVR.  The biggest  problem will be performing his prerequisite CT angiogram and cardiac catheterization without worsening kidney function.  We will send this note to his nephrologist, Dr. Morrison Oconnell and to Dr. Dimas [facility]connell at [facility].  Plan aggressive preprocedural hydration and using the minimal amount of necessary contrast.  AFib: Its been a  very long time since he has had any breakthrough symptoms on treatment with flecainide and diltiazem.  This treatment has worked very well for him.  Despite increased embolic risk, CHADSVasc 3 (age, HTN), we have not prescribed anticoagulants due to his problems with recurrent hematuria.  Consider evaluation for watchman after he has TAVR. Flecainide: He has chronic mild formulation of the QRS complex due to left anterior fascicular block and mild first-degree AV block, both of which proceed treatment with night.  He has not had any evidence of ventricular arrhythmia or of high-grade AV block.  He would be at increased risk for AV block with TAVR.   HTN: His blood pressure is a little high today, but I do not think we should increase antihypertensive medical patient's since he now has symptoms of severe aortic stenosis. CKD4: Would like to involve [facility] nephrolog[facility], Dr. Morrison Oconnell at [facility] as we make preparations for contrast based procedures.  He has significant renal dysfunction following right nephrectomy and partial left nephrectomy for widespread urothelial cancer.  Renal function has been very stable and in fact with most recent creatinine of 2.5 which is is better than previous baseline which was estimated at 2.7-3.0.  He does have chronic metabolic acidosis and takes sodium bicarbonate supplements. Widespread urothelial cancer , followed in the oncology clinic at [facility] by Dr. Dimas Oconnell, Nephrology Dr. Morrison Oconnell.  Good report at his follow-up MRI in June 2022.  CT of the abdomen and pelvis without contrast was performed in June 2022 at [facility]  Report is notable for the presence of  "severe coronary atherosclerotic calcifications" and reports "no abdominal aortic aneurysm" without any mention of atherosclerosis in the abdominal and pelvic arteries.    Medication Adjustments/Labs and Tests Ordered: Current medicines are reviewed at length with the patient today.  Concerns regarding medicines are outlined above.  Medication changes, Labs and Tests ordered today are listed in the Patient Instructions below. Patient Instructions  Medication Instructions:  No changes *If you need a refill on your cardiac medications before your next appointment, please call your pharmacy*   Lab Work: None ordered If you have labs (blood work) drawn today and your tests are completely normal, you will receive your results only by: [place] (if you have MyChart) OR A paper copy in the mail If you have any lab test that is abnormal or we need to change your treatment, we will call you to review the results.   Testing/Procedures: None ordered   Follow-Up: At [facility], you and your health needs are our priority.  As part of our continuing mission to provide you with exceptional heart care, we have created designated Provider Care Teams.  These Care Teams include your primary Cardiologist (physician) and Advanced Practice Providers (APPs -  Physician Assistants and Nurse Practitioners) who all work together to provide you with the care you need, when you need it.  We recommend signing up for the patient portal called "MyChart".  Sign up information is provided on this After Visit Summary.  MyChart is used to connect with patients for Virtual Visits (Telemedicine).  Patients are able to view lab/test results, encounter notes, upcoming appointments, etc.  Non-urgent messages can be sent to your provider as well.   To learn more about what you can do with MyChart, go to [email or web address].    Your next appointment:   Keep your follow up in January   Other Instructions A  referral has been made to the Structural Team. They  will call you for an appointment   Signed, Jordan Klein, MD  09/14/2020 8:38 PM    [address], [place], [place]  [address] Phone: [phone]; Fax: [phone]

## 2020-09-18 DIAGNOSIS — H6522 Chronic serous otitis media, left ear: Secondary | ICD-10-CM | POA: Diagnosis not present

## 2020-09-18 DIAGNOSIS — H90A32 Mixed conductive and sensorineural hearing loss, unilateral, left ear with restricted hearing on the contralateral side: Secondary | ICD-10-CM | POA: Diagnosis not present

## 2020-09-18 DIAGNOSIS — H90A21 Sensorineural hearing loss, unilateral, right ear, with restricted hearing on the contralateral side: Secondary | ICD-10-CM | POA: Diagnosis not present

## 2020-09-18 DIAGNOSIS — J3489 Other specified disorders of nose and nasal sinuses: Secondary | ICD-10-CM | POA: Diagnosis not present

## 2020-09-18 DIAGNOSIS — H903 Sensorineural hearing loss, bilateral: Secondary | ICD-10-CM | POA: Diagnosis not present

## 2020-09-18 DIAGNOSIS — Z974 Presence of external hearing-aid: Secondary | ICD-10-CM | POA: Diagnosis not present

## 2020-09-18 DIAGNOSIS — H6982 Other specified disorders of Eustachian tube, left ear: Secondary | ICD-10-CM | POA: Diagnosis not present

## 2020-09-23 DIAGNOSIS — H25813 Combined forms of age-related cataract, bilateral: Secondary | ICD-10-CM | POA: Diagnosis not present

## 2020-09-23 DIAGNOSIS — H40053 Ocular hypertension, bilateral: Secondary | ICD-10-CM | POA: Diagnosis not present

## 2020-09-23 DIAGNOSIS — H40023 Open angle with borderline findings, high risk, bilateral: Secondary | ICD-10-CM | POA: Diagnosis not present

## 2020-09-23 DIAGNOSIS — H35033 Hypertensive retinopathy, bilateral: Secondary | ICD-10-CM | POA: Diagnosis not present

## 2020-10-01 ENCOUNTER — Ambulatory Visit: Payer: Medicare PPO | Admitting: Cardiovascular Disease

## 2020-10-01 ENCOUNTER — Encounter: Payer: Self-pay | Admitting: Cardiovascular Disease

## 2020-10-01 ENCOUNTER — Other Ambulatory Visit: Payer: Self-pay

## 2020-10-01 VITALS — BP 146/100 | HR 93 | Ht 73.0 in | Wt 203.4 lb

## 2020-10-01 DIAGNOSIS — I480 Paroxysmal atrial fibrillation: Secondary | ICD-10-CM

## 2020-10-01 DIAGNOSIS — N184 Chronic kidney disease, stage 4 (severe): Secondary | ICD-10-CM | POA: Diagnosis not present

## 2020-10-01 DIAGNOSIS — I350 Nonrheumatic aortic (valve) stenosis: Secondary | ICD-10-CM

## 2020-10-01 LAB — CBC
Hematocrit: 46.2 % (ref 37.5–51.0)
Hemoglobin: 15.8 g/dL (ref 13.0–17.7)
MCH: 31 pg (ref 26.6–33.0)
MCHC: 34.2 g/dL (ref 31.5–35.7)
MCV: 91 fL (ref 79–97)
Platelets: 136 10*3/uL — ABNORMAL LOW (ref 150–450)
RBC: 5.09 x10E6/uL (ref 4.14–5.80)
RDW: 14.2 % (ref 11.6–15.4)
WBC: 6.4 10*3/uL (ref 3.4–10.8)

## 2020-10-01 LAB — BASIC METABOLIC PANEL
BUN/Creatinine Ratio: 22 (ref 10–24)
BUN: 51 mg/dL — ABNORMAL HIGH (ref 8–27)
CO2: 24 mmol/L (ref 20–29)
Calcium: 10 mg/dL (ref 8.6–10.2)
Chloride: 108 mmol/L — ABNORMAL HIGH (ref 96–106)
Creatinine, Ser: 2.35 mg/dL — ABNORMAL HIGH (ref 0.76–1.27)
Glucose: 101 mg/dL — ABNORMAL HIGH (ref 65–99)
Potassium: 4.9 mmol/L (ref 3.5–5.2)
Sodium: 140 mmol/L (ref 134–144)
eGFR: 27 mL/min/{1.73_m2} — ABNORMAL LOW (ref >59)

## 2020-10-01 NOTE — Patient Instructions (Signed)
Medication Instructions:  Your physician recommends that you continue on your current medications as directed. Please refer to the Current Medication list given to you today.  *If you need a refill on your cardiac medications before your next appointment, please call your pharmacy*   Lab Work: Lab work to be done today--CBC and BMP If you have labs (blood work) drawn today and your tests are completely normal, you will receive your results only by: [place] (if you have MyChart) OR A paper copy in the mail If you have any lab test that is abnormal or we need to change your treatment, we will call you to review the results.   Testing/Procedures: Your physician has requested that you have a cardiac catheterization. Cardiac catheterization is used to diagnose and/or treat various heart conditions. Doctors may recommend this procedure for a number of different reasons. The most common reason is to evaluate chest pain. Chest pain can be a symptom of coronary artery disease (CAD), and cardiac catheterization can show whether plaque is narrowing or blocking your heart's arteries. This procedure is also used to evaluate the valves, as well as measure the blood flow and oxygen levels in different parts of your heart. For further information please visit [email or web address]. Please follow instruction sheet, as given. Scheduled for 9/12.  You will be admitted to the hospital for observation on 9/11   Follow-Up: At [facility], you and your health needs are our priority.  As part of our continuing mission to provide you with exceptional heart care, we have created designated Provider Care Teams.  These Care Teams include your primary Cardiologist (physician) and Advanced Practice Providers (APPs -  Physician Assistants and Nurse Practitioners) who all work together to provide you with the care you need, when you need it.  We recommend signing up for the patient portal called "MyChart".  Sign up  information is provided on this After Visit Summary.  MyChart is used to connect with patients for Virtual Visits (Telemedicine).  Patients are able to view lab/test results, encounter notes, upcoming appointments, etc.  Non-urgent messages can be sent to your provider as well.   To learn more about what you can do with MyChart, go to [email or web address].      Other Instructions Admitting will call you on 9/11 with information regarding admission later that day.

## 2020-10-01 NOTE — H&P (View-Only) (Signed)
[facility]   MULTIDISCIPLINARY HEART VALVE TEAM  Date:  10/01/2020   ID:  LA DIBELLA, DOB January 28, 1940, MRN [id number]  PCP:  Burnard Bunting, MD   Chief Complaint  Patient presents with   Aortic Stenosis      HISTORY OF PRESENT ILLNESS: Jordan Oconnell is a 80 y.o. male who presents for evaluation of severe aortic stenosis, referred by Dr Sallyanne Kuster.  The patient has a history of paroxysmal atrial fibrillation, chronic kidney disease stage IV, and severe aortic stenosis.  The patient has been followed for several years with aortic stenosis and only recently has become symptomatic.  He has been physically active over time.  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  He also has a history of renal cell carcinoma with partial left nephrectomy.  He has a right lower quadrant ileal conduit and urostomy.  The patient was first told of having a heart murmur before one of his surgeries at [facility] several years ago. He has been followed closely by Dr Sallyanne Kuster. He was asymptomatic until recent months. He has noticed exertional dyspnea with walking, and also with playing pickle ball. He has been able to keep walking regularly without symptoms on most days. However, he recently developed substernal chest pressure one day during his walk. He has walked since then without problems. The patient reports that he has been active all of his life. He otherwise is doing very well and denies lightheadedness, syncope, orthopnea, or PND.    Past Medical History:  Diagnosis Date   A-fib [facility])    with ventricular response   Bladder cancer ([place])    BPH (benign prostatic hyperplasia)    Colon polyps    Diverticulosis    GERD (gastroesophageal reflux disease)    Hyperlipidemia    pt denies   Hypertension    Renal cell carcinoma ([place]) 2008   left    Current Outpatient Medications  Medication Sig Dispense Refill    ALPRAZolam (XANAX) 0.25 MG tablet Take 0.25 mg by mouth at bedtime as needed for anxiety.      diltiazem (TIAZAC) 180 MG 24 hr capsule Take 180 mg by mouth every morning.      escitalopram (LEXAPRO) 10 MG tablet Take 1 tablet by mouth every morning.     flecainide (TAMBOCOR) 50 MG tablet Take 1/2 (one-half) tablet by mouth twice daily 90 tablet 0   fluticasone (FLONASE) 50 MCG/ACT nasal spray Instill 2 puffs each nostril every night.     hydrALAZINE (APRESOLINE) 50 MG tablet Take 50 mg by mouth 2 (two) times daily.     latanoprost (XALATAN) 0.005 % ophthalmic solution INSTILL 1 DROP INTO EACH EYE AT BEDTIME     mirtazapine (REMERON) 7.5 MG tablet Take 7.5 mg by mouth at bedtime.     omeprazole (PRILOSEC) 20 MG capsule Take 2 capsules (40 mg total) by mouth daily. 60 capsule 12   sodium bicarbonate 650 MG tablet Take 2 tablets by mouth 2 (two) times daily.     No current facility-administered medications for this visit.    ALLERGIES:   Amoxicillin, Tamsulosin, Doxycycline, and Flomax [tamsulosin hcl]   SOCIAL HISTORY:  The patient  reports that he has never smoked. He has never used smokeless tobacco. He reports current alcohol use. He reports that he does not use drugs.   FAMILY HISTORY:  The patient's family history includes Breast cancer in his daughter; Colon cancer in  his brother, father, and mother; Crohn's disease in his son; Ovarian cancer in his mother.   REVIEW OF SYSTEMS:  Positive for mild gait instability that he feels is age-related, trigger finger.   All other systems are reviewed and negative.   PHYSICAL EXAM: VS:  BP (!) 146/100   Pulse 93   Ht 6\' 1"  (1.854 m)   Wt 203 lb 6.4 oz (92.3 kg)   SpO2 96%   BMI 26.84 kg/m  , BMI Body mass index is 26.84 kg/m. GEN: Well nourished, well developed, in no acute distress HEENT: normal Neck: No JVD. carotids 2+ with bilateral bruits Cardiac: The heart is RRR with 3/6 late peaking crescendo decrescendo murmur at the right upper  sternal border no edema. Pedal pulses 2+ = bilaterally  Respiratory:  clear to auscultation bilaterally GI: soft, nontender, nondistended, + BS MS: no deformity or atrophy Skin: warm and dry, no rash Neuro:  Strength and sensation are intact Psych: euthymic mood, full affect  EKG:  EKG from 09/11/2020 reviewed and demonstrates NSR with LAFB, HR 73 bpm  RECENT LABS: No results found for requested labs within last 8760 hours.  No results found for requested labs within last 8760 hours.   CrCl cannot be calculated (Patient's most recent lab result is older than the maximum 21 days allowed.).   Wt Readings from Last 3 Encounters:  10/01/20 203 lb 6.4 oz (92.3 kg)  09/11/20 206 lb (93.4 kg)  05/30/20 205 lb (93 kg)     CARDIAC STUDIES: Echo:  IMPRESSIONS     1. The aortic valve is calcified. Aortic valve regurgitation is mild.  Severe aortic valve stenosis. Aortic valve area, by VTI measures 0.92 cm.  Aortic valve mean gradient measures 43.5 mmHg. Aortic valve Vmax measures  4.21 m/s.   2. Left ventricular ejection fraction, by estimation, is 55 to 60%. The  left ventricle has normal function. The left ventricle has no regional  wall motion abnormalities. There is moderate concentric left ventricular  hypertrophy. Left ventricular  diastolic parameters are consistent with Grade I diastolic dysfunction  (impaired relaxation).   3. Right ventricular systolic function is normal. The right ventricular  size is normal. Tricuspid regurgitation signal is inadequate for assessing  PA pressure.   4. The mitral valve is grossly normal. Trivial mitral valve  regurgitation. No evidence of mitral stenosis.   5. There is mild dilatation of the ascending aorta, measuring 43 mm.   6. The inferior vena cava is normal in size with greater than 50%  respiratory variability, suggesting right atrial pressure of 3 mmHg.   Comparison(s): Changes from prior study are noted. AS is now severe.    FINDINGS   Left Ventricle: Left ventricular ejection fraction, by estimation, is 55  to 60%. The left ventricle has normal function. The left ventricle has no  regional wall motion abnormalities. Global longitudinal strain performed  but not reported based on  interpreter judgement due to suboptimal tracking. The left ventricular  internal cavity size was normal in size. There is moderate concentric left  ventricular hypertrophy. Left ventricular diastolic parameters are  consistent with Grade I diastolic  dysfunction (impaired relaxation).   Right Ventricle: The right ventricular size is normal. No increase in  right ventricular wall thickness. Right ventricular systolic function is  normal. Tricuspid regurgitation signal is inadequate for assessing PA  pressure.   Left Atrium: Left atrial size was normal in size.   Right Atrium: Right atrial size was normal in  size.   Pericardium: Trivial pericardial effusion is present. Presence of  pericardial fat pad.   Mitral Valve: The mitral valve is grossly normal. Trivial mitral valve  regurgitation. No evidence of mitral valve stenosis.   Tricuspid Valve: The tricuspid valve is grossly normal. Tricuspid valve  regurgitation is mild . No evidence of tricuspid stenosis.   Aortic Valve: The aortic valve is calcified. Aortic valve regurgitation is  mild. Aortic regurgitation PHT measures 460 msec. Severe aortic stenosis  is present. Aortic valve mean gradient measures 43.5 mmHg. Aortic valve  peak gradient measures 70.9 mmHg.  Aortic valve area, by VTI measures 0.92 cm.   Pulmonic Valve: The pulmonic valve was grossly normal. Pulmonic valve  regurgitation is not visualized. No evidence of pulmonic stenosis.   Aorta: The aortic root is normal in size and structure. There is mild  dilatation of the ascending aorta, measuring 43 mm.   Venous: The inferior vena cava is normal in size with greater than 50%  respiratory variability,  suggesting right atrial pressure of 3 mmHg.   IAS/Shunts: The atrial septum is grossly normal.      LEFT VENTRICLE  PLAX 2D  LVIDd:         4.50 cm  Diastology  LVIDs:         2.90 cm  LV e' medial:    3.32 cm/s  LV PW:         1.60 cm  LV E/e' medial:  17.8  LV IVS:        1.60 cm  LV e' lateral:   3.15 cm/s  LVOT diam:     2.30 cm  LV E/e' lateral: 18.8  LV SV:         85  LV SV Index:   40       2D Longitudinal Strain  LVOT Area:     4.15 cm 2D Strain GLS (A2C):   -18.1 %                          2D Strain GLS (A3C):   -18.1 %                          2D Strain GLS (A4C):   -10.2 %                          2D Strain GLS Avg:     -15.5 %   RIGHT VENTRICLE  RV Basal diam:  3.70 cm  RV S prime:     11.70 cm/s  TAPSE (M-mode): 1.9 cm   LEFT ATRIUM             Index       RIGHT ATRIUM           Index  LA diam:        4.40 cm 2.06 cm/m  RA Pressure: 3.00 mmHg  LA Vol (A2C):   55.3 ml 25.90 ml/m RA Area:     14.40 cm  LA Vol (A4C):   35.8 ml 16.77 ml/m RA Volume:   29.60 ml  13.86 ml/m  LA Biplane Vol: 44.5 ml 20.84 ml/m   AORTIC VALVE  AV Area (Vmax):    0.84 cm  AV Area (Vmean):   0.85 cm  AV Area (VTI):     0.92 cm  AV Vmax:  421.03 cm/s  AV Vmean:          286.080 cm/s  AV VTI:            0.930 m  AV Peak Grad:      70.9 mmHg  AV Mean Grad:      43.5 mmHg  LVOT Vmax:         85.50 cm/s  LVOT Vmean:        58.300 cm/s  LVOT VTI:          0.205 m  LVOT/AV VTI ratio: 0.22  AI PHT:            460 msec     AORTA  Ao Root diam: 4.00 cm  Ao Asc diam:  4.30 cm   MITRAL VALVE               TRICUSPID VALVE                             Estimated RAP:  3.00 mmHg     MV E velocity: 59.10 cm/s  SHUNTS  MV A velocity: 95.20 cm/s  Systemic VTI:  0.20 m  MV E/A ratio:  0.62        Systemic Diam: 2.30 cm     STS RISK CALCULATOR: Isolated AVR: Risk of Mortality: 1.706% Renal Failure: 4.919% Permanent Stroke: 0.996% Prolonged Ventilation: 6.852% DSW  Infection: 0.112% Reoperation: 5.006% Morbidity or Mortality: 16.762% Short Length of Stay: 26.380% Long Length of Stay: 5.690%  ASSESSMENT AND PLAN: 46.  80 year old male with history of bladder and renal cell cancer, stage IV chronic kidney disease, presenting with severe, stage D1 aortic stenosis associated with New York Heart Association functional class II symptoms of exertional dyspnea and chest pressure.  I have reviewed the natural history of aortic stenosis with the patient and their family members who are present today. We have discussed the limitations of medical therapy and the poor prognosis associated with symptomatic aortic stenosis. We have reviewed potential treatment options, including palliative medical therapy, conventional surgical aortic valve replacement, and transcatheter aortic valve replacement. We discussed treatment options in the context of the patient's specific comorbid medical conditions.  TAVR is an appropriate treatment option in this functional, elderly gentleman with comorbid medical problems outlined above.   The patient's 2D echo is personally reviewed and demonstrates calcification and restriction of the aortic valve with a mean transvalvular gradient of 44 mmHg.  The peak transaortic velocity is 4.2 m/s.  LV function is preserved with an LVEF of 55 to 60%.  The aortic valve dimensionless index is 0.22.  Calculated valve area 0.8 cm.  All of the echo parameters are consistent with severe aortic stenosis.  The patient will require right and left heart catheterization to assess hemodynamics and coronary anatomy prior to TAVR. I have reviewed the risks, indications, and alternatives to cardiac catheterization, possible angioplasty, and stenting with the patient. Risks include but are not limited to bleeding, infection, vascular injury, stroke, myocardial infection, arrhythmia, kidney injury, radiation-related injury in the case of prolonged fluoroscopy use,  emergency cardiac surgery, and death. The patient understands the risks of serious complication is 1-2 in 6503 with diagnostic cardiac cath and 1-2% or less with angioplasty/stenting.  Attempts will be made to minimize his risk of acute kidney injury on a background of his chronic kidney disease.  I will admit him to the hospital the night before the procedure for fluid  hydration.  Iodinated contrast will be minimized to the extent possible.  Once he has completed cardiac catheterization, he will need to undergo CT angiography studies of the chest, abdomen, and pelvis, as well as a gated CTA of the heart.  This study can generally be performed with renal protocol to minimize contrast as well.  This CT study will need to be staged at least 1 week after his cardiac catheterization is performed.  Once his CT angiography and cardiac catheterization studies are completed, the patient will be referred for formal cardiac surgical consultation as part of a multidisciplinary approach to his care.  As part of his evaluation today, I demonstrated a TAVR procedural animation, reviewed the procedural steps, and discussed expected recovery and outcomes.  Deatra James 10/01/2020 9:42 AM     [facility] [address] [place] [address]  [phone] (office) [phone] (fax)

## 2020-10-01 NOTE — Progress Notes (Signed)
[facility]   MULTIDISCIPLINARY HEART VALVE TEAM  Date:  10/01/2020   ID:  Jordan Oconnell, DOB 11-Oct-1940, MRN [id number]  PCP:  Burnard Bunting, MD   Chief Complaint  Patient presents with   Aortic Stenosis      HISTORY OF PRESENT ILLNESS: Jordan Oconnell is a 80 y.o. male who presents for evaluation of severe aortic stenosis, referred by Dr Sallyanne Kuster.  The patient has a history of paroxysmal atrial fibrillation, chronic kidney disease stage IV, and severe aortic stenosis.  The patient has been followed for several years with aortic stenosis and only recently has become symptomatic.  He has been physically active over time.  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  He also has a history of renal cell carcinoma with partial left nephrectomy.  He has a right lower quadrant ileal conduit and urostomy.  The patient was first told of having a heart murmur before one of his surgeries at [facility] several years ago. He has been followed closely by Dr Sallyanne Kuster. He was asymptomatic until recent months. He has noticed exertional dyspnea with walking, and also with playing pickle ball. He has been able to keep walking regularly without symptoms on most days. However, he recently developed substernal chest pressure one day during his walk. He has walked since then without problems. The patient reports that he has been active all of his life. He otherwise is doing very well and denies lightheadedness, syncope, orthopnea, or PND.    Past Medical History:  Diagnosis Date   A-fib [facility])    with ventricular response   Bladder cancer ([place])    BPH (benign prostatic hyperplasia)    Colon polyps    Diverticulosis    GERD (gastroesophageal reflux disease)    Hyperlipidemia    pt denies   Hypertension    Renal cell carcinoma ([place]) 2008   left    Current Outpatient Medications  Medication Sig Dispense Refill    ALPRAZolam (XANAX) 0.25 MG tablet Take 0.25 mg by mouth at bedtime as needed for anxiety.      diltiazem (TIAZAC) 180 MG 24 hr capsule Take 180 mg by mouth every morning.      escitalopram (LEXAPRO) 10 MG tablet Take 1 tablet by mouth every morning.     flecainide (TAMBOCOR) 50 MG tablet Take 1/2 (one-half) tablet by mouth twice daily 90 tablet 0   fluticasone (FLONASE) 50 MCG/ACT nasal spray Instill 2 puffs each nostril every night.     hydrALAZINE (APRESOLINE) 50 MG tablet Take 50 mg by mouth 2 (two) times daily.     latanoprost (XALATAN) 0.005 % ophthalmic solution INSTILL 1 DROP INTO EACH EYE AT BEDTIME     mirtazapine (REMERON) 7.5 MG tablet Take 7.5 mg by mouth at bedtime.     omeprazole (PRILOSEC) 20 MG capsule Take 2 capsules (40 mg total) by mouth daily. 60 capsule 12   sodium bicarbonate 650 MG tablet Take 2 tablets by mouth 2 (two) times daily.     No current facility-administered medications for this visit.    ALLERGIES:   Amoxicillin, Tamsulosin, Doxycycline, and Flomax [tamsulosin hcl]   SOCIAL HISTORY:  The patient  reports that he has never smoked. He has never used smokeless tobacco. He reports current alcohol use. He reports that he does not use drugs.   FAMILY HISTORY:  The patient's family history includes Breast cancer in his daughter; Colon cancer in  his brother, father, and mother; Crohn's disease in his son; Ovarian cancer in his mother.   REVIEW OF SYSTEMS:  Positive for mild gait instability that he feels is age-related, trigger finger.   All other systems are reviewed and negative.   PHYSICAL EXAM: VS:  BP (!) 146/100   Pulse 93   Ht 6\' 1"  (1.854 m)   Wt 203 lb 6.4 oz (92.3 kg)   SpO2 96%   BMI 26.84 kg/m  , BMI Body mass index is 26.84 kg/m. GEN: Well nourished, well developed, in no acute distress HEENT: normal Neck: No JVD. carotids 2+ with bilateral bruits Cardiac: The heart is RRR with 3/6 late peaking crescendo decrescendo murmur at the right upper  sternal border no edema. Pedal pulses 2+ = bilaterally  Respiratory:  clear to auscultation bilaterally GI: soft, nontender, nondistended, + BS MS: no deformity or atrophy Skin: warm and dry, no rash Neuro:  Strength and sensation are intact Psych: euthymic mood, full affect  EKG:  EKG from 09/11/2020 reviewed and demonstrates NSR with LAFB, HR 73 bpm  RECENT LABS: No results found for requested labs within last 8760 hours.  No results found for requested labs within last 8760 hours.   CrCl cannot be calculated (Patient's most recent lab result is older than the maximum 21 days allowed.).   Wt Readings from Last 3 Encounters:  10/01/20 203 lb 6.4 oz (92.3 kg)  09/11/20 206 lb (93.4 kg)  05/30/20 205 lb (93 kg)     CARDIAC STUDIES: Echo:  IMPRESSIONS     1. The aortic valve is calcified. Aortic valve regurgitation is mild.  Severe aortic valve stenosis. Aortic valve area, by VTI measures 0.92 cm.  Aortic valve mean gradient measures 43.5 mmHg. Aortic valve Vmax measures  4.21 m/s.   2. Left ventricular ejection fraction, by estimation, is 55 to 60%. The  left ventricle has normal function. The left ventricle has no regional  wall motion abnormalities. There is moderate concentric left ventricular  hypertrophy. Left ventricular  diastolic parameters are consistent with Grade I diastolic dysfunction  (impaired relaxation).   3. Right ventricular systolic function is normal. The right ventricular  size is normal. Tricuspid regurgitation signal is inadequate for assessing  PA pressure.   4. The mitral valve is grossly normal. Trivial mitral valve  regurgitation. No evidence of mitral stenosis.   5. There is mild dilatation of the ascending aorta, measuring 43 mm.   6. The inferior vena cava is normal in size with greater than 50%  respiratory variability, suggesting right atrial pressure of 3 mmHg.   Comparison(s): Changes from prior study are noted. AS is now severe.    FINDINGS   Left Ventricle: Left ventricular ejection fraction, by estimation, is 55  to 60%. The left ventricle has normal function. The left ventricle has no  regional wall motion abnormalities. Global longitudinal strain performed  but not reported based on  interpreter judgement due to suboptimal tracking. The left ventricular  internal cavity size was normal in size. There is moderate concentric left  ventricular hypertrophy. Left ventricular diastolic parameters are  consistent with Grade I diastolic  dysfunction (impaired relaxation).   Right Ventricle: The right ventricular size is normal. No increase in  right ventricular wall thickness. Right ventricular systolic function is  normal. Tricuspid regurgitation signal is inadequate for assessing PA  pressure.   Left Atrium: Left atrial size was normal in size.   Right Atrium: Right atrial size was normal in  size.   Pericardium: Trivial pericardial effusion is present. Presence of  pericardial fat pad.   Mitral Valve: The mitral valve is grossly normal. Trivial mitral valve  regurgitation. No evidence of mitral valve stenosis.   Tricuspid Valve: The tricuspid valve is grossly normal. Tricuspid valve  regurgitation is mild . No evidence of tricuspid stenosis.   Aortic Valve: The aortic valve is calcified. Aortic valve regurgitation is  mild. Aortic regurgitation PHT measures 460 msec. Severe aortic stenosis  is present. Aortic valve mean gradient measures 43.5 mmHg. Aortic valve  peak gradient measures 70.9 mmHg.  Aortic valve area, by VTI measures 0.92 cm.   Pulmonic Valve: The pulmonic valve was grossly normal. Pulmonic valve  regurgitation is not visualized. No evidence of pulmonic stenosis.   Aorta: The aortic root is normal in size and structure. There is mild  dilatation of the ascending aorta, measuring 43 mm.   Venous: The inferior vena cava is normal in size with greater than 50%  respiratory variability,  suggesting right atrial pressure of 3 mmHg.   IAS/Shunts: The atrial septum is grossly normal.      LEFT VENTRICLE  PLAX 2D  LVIDd:         4.50 cm  Diastology  LVIDs:         2.90 cm  LV e' medial:    3.32 cm/s  LV PW:         1.60 cm  LV E/e' medial:  17.8  LV IVS:        1.60 cm  LV e' lateral:   3.15 cm/s  LVOT diam:     2.30 cm  LV E/e' lateral: 18.8  LV SV:         85  LV SV Index:   40       2D Longitudinal Strain  LVOT Area:     4.15 cm 2D Strain GLS (A2C):   -18.1 %                          2D Strain GLS (A3C):   -18.1 %                          2D Strain GLS (A4C):   -10.2 %                          2D Strain GLS Avg:     -15.5 %   RIGHT VENTRICLE  RV Basal diam:  3.70 cm  RV S prime:     11.70 cm/s  TAPSE (M-mode): 1.9 cm   LEFT ATRIUM             Index       RIGHT ATRIUM           Index  LA diam:        4.40 cm 2.06 cm/m  RA Pressure: 3.00 mmHg  LA Vol (A2C):   55.3 ml 25.90 ml/m RA Area:     14.40 cm  LA Vol (A4C):   35.8 ml 16.77 ml/m RA Volume:   29.60 ml  13.86 ml/m  LA Biplane Vol: 44.5 ml 20.84 ml/m   AORTIC VALVE  AV Area (Vmax):    0.84 cm  AV Area (Vmean):   0.85 cm  AV Area (VTI):     0.92 cm  AV Vmax:  421.03 cm/s  AV Vmean:          286.080 cm/s  AV VTI:            0.930 m  AV Peak Grad:      70.9 mmHg  AV Mean Grad:      43.5 mmHg  LVOT Vmax:         85.50 cm/s  LVOT Vmean:        58.300 cm/s  LVOT VTI:          0.205 m  LVOT/AV VTI ratio: 0.22  AI PHT:            460 msec     AORTA  Ao Root diam: 4.00 cm  Ao Asc diam:  4.30 cm   MITRAL VALVE               TRICUSPID VALVE                             Estimated RAP:  3.00 mmHg     MV E velocity: 59.10 cm/s  SHUNTS  MV A velocity: 95.20 cm/s  Systemic VTI:  0.20 m  MV E/A ratio:  0.62        Systemic Diam: 2.30 cm     STS RISK CALCULATOR: Isolated AVR: Risk of Mortality: 1.706% Renal Failure: 4.919% Permanent Stroke: 0.996% Prolonged Ventilation: 6.852% DSW  Infection: 0.112% Reoperation: 5.006% Morbidity or Mortality: 16.762% Short Length of Stay: 26.380% Long Length of Stay: 5.690%  ASSESSMENT AND PLAN: 57.  80 year old male with history of bladder and renal cell cancer, stage IV chronic kidney disease, presenting with severe, stage D1 aortic stenosis associated with New York Heart Association functional class II symptoms of exertional dyspnea and chest pressure.  I have reviewed the natural history of aortic stenosis with the patient and their family members who are present today. We have discussed the limitations of medical therapy and the poor prognosis associated with symptomatic aortic stenosis. We have reviewed potential treatment options, including palliative medical therapy, conventional surgical aortic valve replacement, and transcatheter aortic valve replacement. We discussed treatment options in the context of the patient's specific comorbid medical conditions.  TAVR is an appropriate treatment option in this functional, elderly gentleman with comorbid medical problems outlined above.   The patient's 2D echo is personally reviewed and demonstrates calcification and restriction of the aortic valve with a mean transvalvular gradient of 44 mmHg.  The peak transaortic velocity is 4.2 m/s.  LV function is preserved with an LVEF of 55 to 60%.  The aortic valve dimensionless index is 0.22.  Calculated valve area 0.8 cm.  All of the echo parameters are consistent with severe aortic stenosis.  The patient will require right and left heart catheterization to assess hemodynamics and coronary anatomy prior to TAVR. I have reviewed the risks, indications, and alternatives to cardiac catheterization, possible angioplasty, and stenting with the patient. Risks include but are not limited to bleeding, infection, vascular injury, stroke, myocardial infection, arrhythmia, kidney injury, radiation-related injury in the case of prolonged fluoroscopy use,  emergency cardiac surgery, and death. The patient understands the risks of serious complication is 1-2 in 9924 with diagnostic cardiac cath and 1-2% or less with angioplasty/stenting.  Attempts will be made to minimize his risk of acute kidney injury on a background of his chronic kidney disease.  I will admit him to the hospital the night before the procedure for fluid  hydration.  Iodinated contrast will be minimized to the extent possible.  Once he has completed cardiac catheterization, he will need to undergo CT angiography studies of the chest, abdomen, and pelvis, as well as a gated CTA of the heart.  This study can generally be performed with renal protocol to minimize contrast as well.  This CT study will need to be staged at least 1 week after his cardiac catheterization is performed.  Once his CT angiography and cardiac catheterization studies are completed, the patient will be referred for formal cardiac surgical consultation as part of a multidisciplinary approach to his care.  As part of his evaluation today, I demonstrated a TAVR procedural animation, reviewed the procedural steps, and discussed expected recovery and outcomes.  Deatra James 10/01/2020 9:42 AM     [facility] [address] [name] [address]  [phone] (office) [phone] (fax)

## 2020-10-03 DIAGNOSIS — Z936 Other artificial openings of urinary tract status: Secondary | ICD-10-CM | POA: Diagnosis not present

## 2020-10-05 ENCOUNTER — Encounter: Payer: Self-pay | Admitting: Cardiovascular Disease

## 2020-10-05 ENCOUNTER — Encounter (HOSPITAL_COMMUNITY): Payer: Self-pay | Admitting: Cardiovascular Disease

## 2020-10-05 ENCOUNTER — Observation Stay (HOSPITAL_COMMUNITY)
Admission: RE | Admit: 2020-10-05 | Discharge: 2020-10-06 | Disposition: A | Discharge status: Home / Self Care | Payer: Medicare PPO | Attending: Cardiovascular Disease | Admitting: Cardiovascular Disease

## 2020-10-05 DIAGNOSIS — I129 Hypertensive chronic kidney disease with stage 1 through stage 4 chronic kidney disease, or unspecified chronic kidney disease: Secondary | ICD-10-CM | POA: Diagnosis not present

## 2020-10-05 DIAGNOSIS — N184 Chronic kidney disease, stage 4 (severe): Secondary | ICD-10-CM | POA: Diagnosis not present

## 2020-10-05 DIAGNOSIS — Z85528 Personal history of other malignant neoplasm of kidney: Secondary | ICD-10-CM | POA: Diagnosis not present

## 2020-10-05 DIAGNOSIS — Z79899 Other long term (current) drug therapy: Secondary | ICD-10-CM | POA: Diagnosis not present

## 2020-10-05 DIAGNOSIS — I350 Nonrheumatic aortic (valve) stenosis: Secondary | ICD-10-CM

## 2020-10-05 DIAGNOSIS — I2510 Atherosclerotic heart disease of native coronary artery without angina pectoris: Secondary | ICD-10-CM | POA: Diagnosis not present

## 2020-10-05 DIAGNOSIS — Z8551 Personal history of malignant neoplasm of bladder: Secondary | ICD-10-CM | POA: Insufficient documentation

## 2020-10-05 DIAGNOSIS — Z20822 Contact with and (suspected) exposure to covid-19: Secondary | ICD-10-CM | POA: Diagnosis not present

## 2020-10-05 HISTORY — DX: Nonrheumatic aortic (valve) stenosis: I35.0

## 2020-10-05 LAB — CBC WITH DIFFERENTIAL/PLATELET
Abs Immature Granulocytes: 0.02 10*3/uL (ref 0.00–0.07)
Basophils Absolute: 0.1 10*3/uL (ref 0.0–0.1)
Basophils Relative: 1 %
Eosinophils Absolute: 0.2 10*3/uL (ref 0.0–0.5)
Eosinophils Relative: 5 %
HCT: 44.1 % (ref 39.0–52.0)
Hemoglobin: 13.9 g/dL (ref 13.0–17.0)
Immature Granulocytes: 0 %
Lymphocytes Relative: 16 %
Lymphs Abs: 0.8 10*3/uL (ref 0.7–4.0)
MCH: 30.1 pg (ref 26.0–34.0)
MCHC: 31.5 g/dL (ref 30.0–36.0)
MCV: 95.5 fL (ref 80.0–100.0)
Monocytes Absolute: 0.6 10*3/uL (ref 0.1–1.0)
Monocytes Relative: 12 %
Neutro Abs: 3.5 10*3/uL (ref 1.7–7.7)
Neutrophils Relative %: 66 %
Platelets: 107 10*3/uL — ABNORMAL LOW (ref 150–400)
RBC: 4.62 MIL/uL (ref 4.22–5.81)
RDW: 13.8 % (ref 11.5–15.5)
WBC: 5.2 10*3/uL (ref 4.0–10.5)
nRBC: 0 % (ref 0.0–0.2)

## 2020-10-05 LAB — COMPREHENSIVE METABOLIC PANEL
ALT: 18 U/L (ref 0–44)
AST: 18 U/L (ref 15–41)
Albumin: 3.5 g/dL (ref 3.5–5.0)
Alkaline Phosphatase: 38 U/L (ref 38–126)
Anion gap: 11 (ref 5–15)
BUN: 44 mg/dL — ABNORMAL HIGH (ref 8–23)
CO2: 23 mmol/L (ref 22–32)
Calcium: 9.8 mg/dL (ref 8.9–10.3)
Chloride: 104 mmol/L (ref 98–111)
Creatinine, Ser: 2.49 mg/dL — ABNORMAL HIGH (ref 0.61–1.24)
GFR, Estimated: 25 mL/min — ABNORMAL LOW (ref >60)
Glucose, Bld: 103 mg/dL — ABNORMAL HIGH (ref 70–99)
Potassium: 4.2 mmol/L (ref 3.5–5.1)
Sodium: 138 mmol/L (ref 135–145)
Total Bilirubin: 0.7 mg/dL (ref 0.3–1.2)
Total Protein: 6.3 g/dL — ABNORMAL LOW (ref 6.5–8.1)

## 2020-10-05 MED ORDER — ALPRAZOLAM 0.25 MG PO TABS: 0.2500 mg | ORAL_TABLET | Freq: Two times a day (BID) | ORAL | Status: DC | PRN

## 2020-10-05 MED ORDER — DILTIAZEM HCL ER COATED BEADS 180 MG PO CP24
180.0000 mg | ORAL_CAPSULE | Freq: Every day | ORAL | Status: DC
  Administered 2020-10-06: 180 mg via ORAL
  Filled 2020-10-05 (×2): qty 1

## 2020-10-05 MED ORDER — LATANOPROST 0.005 % OP SOLN
1.0000 [drp] | Freq: Every day | OPHTHALMIC | Status: DC
  Administered 2020-10-05: 1 [drp] via OPHTHALMIC
  Filled 2020-10-05: qty 2.5

## 2020-10-05 MED ORDER — ALPRAZOLAM 0.25 MG PO TABS
0.2500 mg | ORAL_TABLET | Freq: Every day | ORAL | Status: DC
  Administered 2020-10-05: 0.25 mg via ORAL
  Filled 2020-10-05: qty 1

## 2020-10-05 MED ORDER — ACETAMINOPHEN 325 MG PO TABS: 650.0000 mg | ORAL_TABLET | ORAL | Status: DC | PRN

## 2020-10-05 MED ORDER — ESCITALOPRAM OXALATE 10 MG PO TABS
10.0000 mg | ORAL_TABLET | Freq: Every morning | ORAL | Status: DC
  Administered 2020-10-06: 10 mg via ORAL
  Filled 2020-10-05: qty 1

## 2020-10-05 MED ORDER — SODIUM CHLORIDE 0.9 % IV SOLN: INTRAVENOUS | Status: DC

## 2020-10-05 MED ORDER — HYDRALAZINE HCL 50 MG PO TABS
50.0000 mg | ORAL_TABLET | Freq: Two times a day (BID) | ORAL | Status: DC
  Administered 2020-10-06: 50 mg via ORAL
  Filled 2020-10-05 (×2): qty 1

## 2020-10-05 MED ORDER — ZOLPIDEM TARTRATE 5 MG PO TABS
5.0000 mg | ORAL_TABLET | Freq: Every evening | ORAL | Status: DC | PRN
Start: 2020-10-05 — End: 2020-10-06

## 2020-10-05 MED ORDER — SODIUM BICARBONATE 650 MG PO TABS
1300.0000 mg | ORAL_TABLET | Freq: Two times a day (BID) | ORAL | Status: DC
  Administered 2020-10-06: 1300 mg via ORAL
  Filled 2020-10-05 (×3): qty 2

## 2020-10-05 MED ORDER — ENOXAPARIN SODIUM 40 MG/0.4ML IJ SOSY
40.0000 mg | PREFILLED_SYRINGE | INTRAMUSCULAR | Status: DC
  Administered 2020-10-05: 40 mg via SUBCUTANEOUS
  Filled 2020-10-05: qty 0.4

## 2020-10-05 MED ORDER — FLUTICASONE PROPIONATE 50 MCG/ACT NA SUSP
2.0000 | Freq: Every morning | NASAL | Status: DC
  Filled 2020-10-05: qty 16

## 2020-10-05 MED ORDER — NITROGLYCERIN 0.4 MG SL SUBL: 0.4000 mg | SUBLINGUAL_TABLET | SUBLINGUAL | Status: DC | PRN

## 2020-10-05 MED ORDER — ONDANSETRON HCL 4 MG/2ML IJ SOLN: 4.0000 mg | Freq: Four times a day (QID) | INTRAMUSCULAR | Status: DC | PRN

## 2020-10-05 MED ORDER — MIRTAZAPINE 7.5 MG PO TABS
7.5000 mg | ORAL_TABLET | Freq: Every day | ORAL | Status: DC
  Administered 2020-10-05: 7.5 mg via ORAL
  Filled 2020-10-05 (×2): qty 1

## 2020-10-05 MED ORDER — SODIUM CHLORIDE 0.9 % IV SOLN
INTRAVENOUS | Status: DC
Start: 2020-10-06 — End: 2020-10-06

## 2020-10-05 MED ORDER — ASPIRIN 81 MG PO CHEW
81.0000 mg | CHEWABLE_TABLET | ORAL | Status: AC
  Administered 2020-10-06: 81 mg via ORAL
  Filled 2020-10-05: qty 1

## 2020-10-05 MED ORDER — SODIUM CHLORIDE 0.9% FLUSH: 3.0000 mL | Freq: Two times a day (BID) | INTRAVENOUS | Status: DC

## 2020-10-05 MED ORDER — PANTOPRAZOLE SODIUM 40 MG PO TBEC
40.0000 mg | DELAYED_RELEASE_TABLET | Freq: Every day | ORAL | Status: DC
  Administered 2020-10-06: 40 mg via ORAL
  Filled 2020-10-05 (×2): qty 1

## 2020-10-05 MED ORDER — FLECAINIDE ACETATE 50 MG PO TABS
25.0000 mg | ORAL_TABLET | Freq: Two times a day (BID) | ORAL | Status: DC
  Administered 2020-10-05 – 2020-10-06 (×2): 25 mg via ORAL
  Filled 2020-10-05 (×2): qty 1

## 2020-10-05 MED ORDER — SODIUM CHLORIDE 0.9 % IV SOLN: 250.0000 mL | INTRAVENOUS | Status: DC | PRN

## 2020-10-05 MED ORDER — SODIUM CHLORIDE 0.9% FLUSH: 3.0000 mL | INTRAVENOUS | Status: DC | PRN

## 2020-10-05 NOTE — H&P (Addendum)
[facility]   MULTIDISCIPLINARY HEART VALVE TEAM  Date:  10/05/2020   ID:  YOUSUF Oconnell, DOB Jun 11, 1940, MRN [id number]  PCP:  [facility] Bunting, MD   No chief complaint on file.     HISTORY OF PRESENT ILLNESS: [facility] is a 80 y.o. male who presents for evaluation of severe aortic stenosis, referred by Dr [facility].  The patient has a history of paroxysmal atrial fibrillation, chronic kidney disease stage IV, and severe aortic stenosis.  The patient has been followed for several years with aortic stenosis and only recently has become symptomatic.  He has been physically active over time.  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  He also has a history of renal cell carcinoma with partial left nephrectomy.  He has a right lower quadrant ileal conduit and urostomy.  The patient was first told of having a heart murmur before one of his surgeries at [facility] several years ago. He has been followed closely by Dr [facility]. He was asymptomatic until recent months. He has noticed exertional dyspnea with walking, and also with playing pickle ball. He has been able to keep walking regularly without symptoms on most days. However, he recently developed substernal chest pressure one day during his walk. He has walked since then without problems. The patient reports that he has been active all of his life. He otherwise is doing very well and denies lightheadedness, syncope, orthopnea, or PND.    Past Medical History:  Diagnosis Date   A-fib [facility])    with ventricular response   Bladder cancer (HCC)    BPH (benign prostatic hyperplasia)    Colon polyps    Diverticulosis    GERD (gastroesophageal reflux disease)    Hyperlipidemia    pt denies   Hypertension    Renal cell carcinoma ([place]) 2008   left   Severe aortic stenosis 10/05/2020    No current facility-administered medications for this encounter.     ALLERGIES:   Amoxicillin, Doxycycline, and Flomax [tamsulosin hcl]   SOCIAL HISTORY:  The patient  reports that he has never smoked. He has never used smokeless tobacco. He reports current alcohol use. He reports that he does not use drugs.   FAMILY HISTORY:  The patient's family history includes Breast cancer in his daughter; Colon cancer in his brother, father, and mother; Crohn's disease in his son; Ovarian cancer in his mother.   REVIEW OF SYSTEMS:  Positive for mild gait instability that he feels is age-related, trigger finger.   All other systems are reviewed and negative.   PHYSICAL EXAM: VS:  BP (!) 154/89 (BP Location: Right Arm)   Pulse 75   Temp 97.8 F (36.6 C) (Oral)   Resp (!) 22   Ht 6\' 1"  (1.854 m)   Wt 94.1 kg   SpO2 97%   BMI 27.37 kg/m  , BMI Body mass index is 27.37 kg/m. GEN: Well nourished, well developed, in no acute distress HEENT: normal Neck: No JVD. carotids 2+ with bilateral bruits Cardiac: The heart is RRR with 3/6 late peaking crescendo decrescendo murmur at the right upper sternal border no edema. Pedal pulses 2+ = bilaterally  Respiratory:  clear to auscultation bilaterally GI: soft, nontender, nondistended, + BS MS: no deformity or atrophy Skin: warm and dry, no rash Neuro:  Strength and sensation are intact Psych: euthymic mood, full affect  EKG:  EKG from 09/11/2020 reviewed and demonstrates  NSR with LAFB, HR 73 bpm  RECENT LABS: 10/01/2020: BUN 51; Creatinine, Ser 2.35; Hemoglobin 15.8; Platelets 136; Potassium 4.9; Sodium 140  No results found for requested labs within last 8760 hours.   Estimated Creatinine Clearance: 27.9 mL/min (A) (by C-G formula based on SCr of 2.35 mg/dL (H)).   Wt Readings from Last 3 Encounters:  10/05/20 94.1 kg  10/01/20 92.3 kg  09/11/20 93.4 kg     CARDIAC STUDIES: Echo:  IMPRESSIONS     1. The aortic valve is calcified. Aortic valve regurgitation is mild.  Severe aortic valve stenosis. Aortic  valve area, by VTI measures 0.92 cm.  Aortic valve mean gradient measures 43.5 mmHg. Aortic valve Vmax measures  4.21 m/s.   2. Left ventricular ejection fraction, by estimation, is 55 to 60%. The  left ventricle has normal function. The left ventricle has no regional  wall motion abnormalities. There is moderate concentric left ventricular  hypertrophy. Left ventricular  diastolic parameters are consistent with Grade I diastolic dysfunction  (impaired relaxation).   3. Right ventricular systolic function is normal. The right ventricular  size is normal. Tricuspid regurgitation signal is inadequate for assessing  PA pressure.   4. The mitral valve is grossly normal. Trivial mitral valve  regurgitation. No evidence of mitral stenosis.   5. There is mild dilatation of the ascending aorta, measuring 43 mm.   6. The inferior vena cava is normal in size with greater than 50%  respiratory variability, suggesting right atrial pressure of 3 mmHg.   Comparison(s): Changes from prior study are noted. AS is now severe.   FINDINGS   Left Ventricle: Left ventricular ejection fraction, by estimation, is 55  to 60%. The left ventricle has normal function. The left ventricle has no  regional wall motion abnormalities. Global longitudinal strain performed  but not reported based on  interpreter judgement due to suboptimal tracking. The left ventricular  internal cavity size was normal in size. There is moderate concentric left  ventricular hypertrophy. Left ventricular diastolic parameters are  consistent with Grade I diastolic  dysfunction (impaired relaxation).   Right Ventricle: The right ventricular size is normal. No increase in  right ventricular wall thickness. Right ventricular systolic function is  normal. Tricuspid regurgitation signal is inadequate for assessing PA  pressure.   Left Atrium: Left atrial size was normal in size.   Right Atrium: Right atrial size was normal in size.    Pericardium: Trivial pericardial effusion is present. Presence of  pericardial fat pad.   Mitral Valve: The mitral valve is grossly normal. Trivial mitral valve  regurgitation. No evidence of mitral valve stenosis.   Tricuspid Valve: The tricuspid valve is grossly normal. Tricuspid valve  regurgitation is mild . No evidence of tricuspid stenosis.   Aortic Valve: The aortic valve is calcified. Aortic valve regurgitation is  mild. Aortic regurgitation PHT measures 460 msec. Severe aortic stenosis  is present. Aortic valve mean gradient measures 43.5 mmHg. Aortic valve  peak gradient measures 70.9 mmHg.  Aortic valve area, by VTI measures 0.92 cm.   Pulmonic Valve: The pulmonic valve was grossly normal. Pulmonic valve  regurgitation is not visualized. No evidence of pulmonic stenosis.   Aorta: The aortic root is normal in size and structure. There is mild  dilatation of the ascending aorta, measuring 43 mm.   Venous: The inferior vena cava is normal in size with greater than 50%  respiratory variability, suggesting right atrial pressure of 3 mmHg.   IAS/Shunts:  The atrial septum is grossly normal.      LEFT VENTRICLE  PLAX 2D  LVIDd:         4.50 cm  Diastology  LVIDs:         2.90 cm  LV e' medial:    3.32 cm/s  LV PW:         1.60 cm  LV E/e' medial:  17.8  LV IVS:        1.60 cm  LV e' lateral:   3.15 cm/s  LVOT diam:     2.30 cm  LV E/e' lateral: 18.8  LV SV:         85  LV SV Index:   40       2D Longitudinal Strain  LVOT Area:     4.15 cm 2D Strain GLS (A2C):   -18.1 %                          2D Strain GLS (A3C):   -18.1 %                          2D Strain GLS (A4C):   -10.2 %                          2D Strain GLS Avg:     -15.5 %   RIGHT VENTRICLE  RV Basal diam:  3.70 cm  RV S prime:     11.70 cm/s  TAPSE (M-mode): 1.9 cm   LEFT ATRIUM             Index       RIGHT ATRIUM           Index  LA diam:        4.40 cm 2.06 cm/m  RA Pressure: 3.00 mmHg  LA Vol  (A2C):   55.3 ml 25.90 ml/m RA Area:     14.40 cm  LA Vol (A4C):   35.8 ml 16.77 ml/m RA Volume:   29.60 ml  13.86 ml/m  LA Biplane Vol: 44.5 ml 20.84 ml/m   AORTIC VALVE  AV Area (Vmax):    0.84 cm  AV Area (Vmean):   0.85 cm  AV Area (VTI):     0.92 cm  AV Vmax:           421.03 cm/s  AV Vmean:          286.080 cm/s  AV VTI:            0.930 m  AV Peak Grad:      70.9 mmHg  AV Mean Grad:      43.5 mmHg  LVOT Vmax:         85.50 cm/s  LVOT Vmean:        58.300 cm/s  LVOT VTI:          0.205 m  LVOT/AV VTI ratio: 0.22  AI PHT:            460 msec     AORTA  Ao Root diam: 4.00 cm  Ao Asc diam:  4.30 cm   MITRAL VALVE               TRICUSPID VALVE  Estimated RAP:  3.00 mmHg     MV E velocity: 59.10 cm/s  SHUNTS  MV A velocity: 95.20 cm/s  Systemic VTI:  0.20 m  MV E/A ratio:  0.62        Systemic Diam: 2.30 cm     STS RISK CALCULATOR: Isolated AVR: Risk of Mortality: 1.706% Renal Failure: 4.919% Permanent Stroke: 0.996% Prolonged Ventilation: 6.852% DSW Infection: 0.112% Reoperation: 5.006% Morbidity or Mortality: 16.762% Short Length of Stay: 26.380% Long Length of Stay: 5.690%  ASSESSMENT AND PLAN: 65.  80 year old male with history of bladder and renal cell cancer, stage IV chronic kidney disease, presenting with severe, stage D1 aortic stenosis associated with [place] Heart Association functional class II symptoms of exertional dyspnea and chest pressure.  I have reviewed the natural history of aortic stenosis with the patient and their family members who are present today. We have discussed the limitations of medical therapy and the poor prognosis associated with symptomatic aortic stenosis. We have reviewed potential treatment options, including palliative medical therapy, conventional surgical aortic valve replacement, and transcatheter aortic valve replacement. We discussed treatment options in the context of the  patient's specific comorbid medical conditions.  TAVR is an appropriate treatment option in this functional, elderly gentleman with comorbid medical problems outlined above.   The patient's 2D echo is personally reviewed and demonstrates calcification and restriction of the aortic valve with a mean transvalvular gradient of 44 mmHg.  The peak transaortic velocity is 4.2 m/s.  LV function is preserved with an LVEF of 55 to 60%.  The aortic valve dimensionless index is 0.22.  Calculated valve area 0.8 cm.  All of the echo parameters are consistent with severe aortic stenosis.  The patient will require right and left heart catheterization to assess hemodynamics and coronary anatomy prior to TAVR. I have reviewed the risks, indications, and alternatives to cardiac catheterization, possible angioplasty, and stenting with the patient. Risks include but are not limited to bleeding, infection, vascular injury, stroke, myocardial infection, arrhythmia, kidney injury, radiation-related injury in the case of prolonged fluoroscopy use, emergency cardiac surgery, and death. The patient understands the risks of serious complication is 1-2 in 6962 with diagnostic cardiac cath and 1-2% or less with angioplasty/stenting.  Attempts will be made to minimize his risk of acute kidney injury on a background of his chronic kidney disease.  I will admit him to the hospital the night before the procedure for fluid hydration.  Iodinated contrast will be minimized to the extent possible.  Once he has completed cardiac catheterization, he will need to undergo CT angiography studies of the chest, abdomen, and pelvis, as well as a gated CTA of the heart.  This study can generally be performed with renal protocol to minimize contrast as well.  This CT study will need to be staged at least 1 week after his cardiac catheterization is performed.  Once his CT angiography and cardiac catheterization studies are completed, the patient will be  referred for formal cardiac surgical consultation as part of a multidisciplinary approach to his care.  As part of his evaluation today, I demonstrated a TAVR procedural animation, reviewed the procedural steps, and discussed expected recovery and outcomes.  [facility] 10/05/2020 5:08 PM     [facility] [address] [place] [address]  [phone] (office) [phone] (fax)   Agree with above.  Sherren Mocha 10/06/2020 7:05 AM

## 2020-10-06 ENCOUNTER — Other Ambulatory Visit: Payer: Self-pay

## 2020-10-06 ENCOUNTER — Encounter (HOSPITAL_COMMUNITY): Payer: Self-pay | Admitting: Cardiovascular Disease

## 2020-10-06 ENCOUNTER — Encounter (HOSPITAL_COMMUNITY)
Admission: RE | Disposition: A | Discharge status: Home / Self Care | Payer: Medicare PPO | Source: Home / Self Care | Attending: Cardiovascular Disease

## 2020-10-06 DIAGNOSIS — I129 Hypertensive chronic kidney disease with stage 1 through stage 4 chronic kidney disease, or unspecified chronic kidney disease: Secondary | ICD-10-CM | POA: Diagnosis not present

## 2020-10-06 DIAGNOSIS — I350 Nonrheumatic aortic (valve) stenosis: Secondary | ICD-10-CM

## 2020-10-06 DIAGNOSIS — I2510 Atherosclerotic heart disease of native coronary artery without angina pectoris: Secondary | ICD-10-CM

## 2020-10-06 DIAGNOSIS — Z85528 Personal history of other malignant neoplasm of kidney: Secondary | ICD-10-CM | POA: Diagnosis not present

## 2020-10-06 DIAGNOSIS — N184 Chronic kidney disease, stage 4 (severe): Secondary | ICD-10-CM | POA: Diagnosis not present

## 2020-10-06 DIAGNOSIS — Z79899 Other long term (current) drug therapy: Secondary | ICD-10-CM | POA: Diagnosis not present

## 2020-10-06 DIAGNOSIS — Z8551 Personal history of malignant neoplasm of bladder: Secondary | ICD-10-CM | POA: Diagnosis not present

## 2020-10-06 DIAGNOSIS — Z20822 Contact with and (suspected) exposure to covid-19: Secondary | ICD-10-CM | POA: Diagnosis not present

## 2020-10-06 HISTORY — PX: RIGHT HEART CATH AND CORONARY ANGIOGRAPHY: CATH118264

## 2020-10-06 LAB — POCT I-STAT 7, (LYTES, BLD GAS, ICA,H+H)
Acid-base deficit: 3 mmol/L — ABNORMAL HIGH (ref 0.0–2.0)
Acid-base deficit: 4 mmol/L — ABNORMAL HIGH (ref 0.0–2.0)
Bicarbonate: 21 mmol/L (ref 20.0–28.0)
Bicarbonate: 22.9 mmol/L (ref 20.0–28.0)
Calcium, Ion: 1.27 mmol/L (ref 1.15–1.40)
Calcium, Ion: 1.28 mmol/L (ref 1.15–1.40)
HCT: 41 % (ref 39.0–52.0)
HCT: 42 % (ref 39.0–52.0)
Hemoglobin: 13.9 g/dL (ref 13.0–17.0)
Hemoglobin: 14.3 g/dL (ref 13.0–17.0)
O2 Saturation: 70 %
O2 Saturation: 97 %
Potassium: 4.2 mmol/L (ref 3.5–5.1)
Potassium: 4.3 mmol/L (ref 3.5–5.1)
Sodium: 140 mmol/L (ref 135–145)
Sodium: 141 mmol/L (ref 135–145)
TCO2: 22 mmol/L (ref 22–32)
TCO2: 24 mmol/L (ref 22–32)
pCO2 arterial: 35.6 mmHg (ref 32.0–48.0)
pCO2 arterial: 40.9 mmHg (ref 32.0–48.0)
pH, Arterial: 7.355 (ref 7.350–7.450)
pH, Arterial: 7.379 (ref 7.350–7.450)
pO2, Arterial: 39 mmHg — CL (ref 83.0–108.0)
pO2, Arterial: 91 mmHg (ref 83.0–108.0)

## 2020-10-06 LAB — BASIC METABOLIC PANEL WITH GFR
Anion gap: 7 (ref 5–15)
BUN: 43 mg/dL — ABNORMAL HIGH (ref 8–23)
CO2: 23 mmol/L (ref 22–32)
Calcium: 9.1 mg/dL (ref 8.9–10.3)
Chloride: 108 mmol/L (ref 98–111)
Creatinine, Ser: 2.33 mg/dL — ABNORMAL HIGH (ref 0.61–1.24)
GFR, Estimated: 27 mL/min — ABNORMAL LOW
Glucose, Bld: 110 mg/dL — ABNORMAL HIGH (ref 70–99)
Potassium: 4.3 mmol/L (ref 3.5–5.1)
Sodium: 138 mmol/L (ref 135–145)

## 2020-10-06 LAB — SARS CORONAVIRUS 2 BY RT PCR (HOSPITAL ORDER, PERFORMED IN ~~LOC~~ HOSPITAL LAB): SARS Coronavirus 2: NEGATIVE

## 2020-10-06 SURGERY — RIGHT HEART CATH AND CORONARY ANGIOGRAPHY
Anesthesia: LOCAL

## 2020-10-06 MED ORDER — SODIUM CHLORIDE 0.9% FLUSH: 3.0000 mL | INTRAVENOUS | Status: DC | PRN

## 2020-10-06 MED ORDER — SODIUM CHLORIDE 0.9 % WEIGHT BASED INFUSION: 1.0000 mL/kg/h | INTRAVENOUS | Status: DC

## 2020-10-06 MED ORDER — SODIUM CHLORIDE 0.9 % IV SOLN: 250.0000 mL | INTRAVENOUS | Status: DC | PRN

## 2020-10-06 MED ORDER — VERAPAMIL HCL 2.5 MG/ML IV SOLN
INTRAVENOUS | Status: AC
  Filled 2020-10-06: qty 2

## 2020-10-06 MED ORDER — HYDRALAZINE HCL 20 MG/ML IJ SOLN: 10.0000 mg | INTRAMUSCULAR | Status: AC | PRN

## 2020-10-06 MED ORDER — HEPARIN SODIUM (PORCINE) 1000 UNIT/ML IJ SOLN
INTRAMUSCULAR | Status: DC | PRN
  Administered 2020-10-06: 5000 [IU] via INTRAVENOUS

## 2020-10-06 MED ORDER — ENOXAPARIN SODIUM 30 MG/0.3ML IJ SOSY: 30.0000 mg | PREFILLED_SYRINGE | INTRAMUSCULAR | Status: DC

## 2020-10-06 MED ORDER — HEPARIN (PORCINE) IN NACL 1000-0.9 UT/500ML-% IV SOLN
INTRAVENOUS | Status: AC
  Filled 2020-10-06: qty 1000

## 2020-10-06 MED ORDER — SODIUM CHLORIDE 0.9% FLUSH: 3.0000 mL | Freq: Two times a day (BID) | INTRAVENOUS | Status: DC

## 2020-10-06 MED ORDER — MIDAZOLAM HCL 2 MG/2ML IJ SOLN
INTRAMUSCULAR | Status: AC
  Filled 2020-10-06: qty 2

## 2020-10-06 MED ORDER — IOHEXOL 350 MG/ML SOLN
INTRAVENOUS | Status: DC | PRN
  Administered 2020-10-06: 30 mL

## 2020-10-06 MED ORDER — HEPARIN (PORCINE) IN NACL 1000-0.9 UT/500ML-% IV SOLN
INTRAVENOUS | Status: DC | PRN
  Administered 2020-10-06 (×3): 500 mL

## 2020-10-06 MED ORDER — HEPARIN SODIUM (PORCINE) 1000 UNIT/ML IJ SOLN
INTRAMUSCULAR | Status: AC
  Filled 2020-10-06: qty 1

## 2020-10-06 MED ORDER — MIDAZOLAM HCL 2 MG/2ML IJ SOLN
INTRAMUSCULAR | Status: DC | PRN
  Administered 2020-10-06 (×2): 1 mg via INTRAVENOUS

## 2020-10-06 MED ORDER — LABETALOL HCL 5 MG/ML IV SOLN: 10.0000 mg | INTRAVENOUS | Status: AC | PRN

## 2020-10-06 MED ORDER — LIDOCAINE HCL (PF) 1 % IJ SOLN
INTRAMUSCULAR | Status: DC | PRN
  Administered 2020-10-06: 4 mL

## 2020-10-06 MED ORDER — VERAPAMIL HCL 2.5 MG/ML IV SOLN
INTRAVENOUS | Status: DC | PRN
  Administered 2020-10-06: 10 mL via INTRA_ARTERIAL

## 2020-10-06 MED ORDER — FENTANYL CITRATE (PF) 100 MCG/2ML IJ SOLN
INTRAMUSCULAR | Status: AC
  Filled 2020-10-06: qty 2

## 2020-10-06 MED ORDER — LIDOCAINE HCL (PF) 1 % IJ SOLN
INTRAMUSCULAR | Status: AC
  Filled 2020-10-06: qty 30

## 2020-10-06 MED ORDER — FENTANYL CITRATE (PF) 100 MCG/2ML IJ SOLN
INTRAMUSCULAR | Status: DC | PRN
  Administered 2020-10-06: 25 ug via INTRAVENOUS

## 2020-10-06 SURGICAL SUPPLY — 13 items
CATH 5FR JL3.5 JR4 ANG PIG MP (CATHETERS) ×2 IMPLANT
CATH BALLN WEDGE 5F 110CM (CATHETERS) ×2 IMPLANT
CATH LAUNCHER 5F EBU3.5 (CATHETERS) ×2 IMPLANT
DEVICE RAD COMP TR BAND LRG (VASCULAR PRODUCTS) ×2 IMPLANT
GLIDESHEATH SLEND SS 6F .021 (SHEATH) ×2 IMPLANT
GUIDEWIRE INQWIRE 1.5J.035X260 (WIRE) ×1 IMPLANT
INQWIRE 1.5J .035X260CM (WIRE) ×2
KIT HEART LEFT (KITS) ×2 IMPLANT
PACK CARDIAC CATHETERIZATION (CUSTOM PROCEDURE TRAY) ×2 IMPLANT
SHEATH GLIDE SLENDER 4/5FR (SHEATH) ×2 IMPLANT
SHEATH PROBE COVER 6X72 (BAG) ×2 IMPLANT
TRANSDUCER W/STOPCOCK (MISCELLANEOUS) ×2 IMPLANT
TUBING CIL FLEX 10 FLL-RA (TUBING) ×2 IMPLANT

## 2020-10-06 NOTE — Interval H&P Note (Signed)
History and Physical Interval Note:  10/06/2020 7:04 AM  [facility]  has presented today for surgery, with the diagnosis of aortic stenosis.  The various methods of treatment have been discussed with the patient and family. After consideration of risks, benefits and other options for treatment, the patient has consented to  Procedure(s): RIGHT/LEFT HEART CATH AND CORONARY ANGIOGRAPHY (N/A) as a surgical intervention.  The patient's history has been reviewed, patient examined, no change in status, stable for surgery.  I have reviewed the patient's chart and labs.  Questions were answered to the patient's satisfaction.     Sherren Mocha

## 2020-10-06 NOTE — Progress Notes (Signed)
  [facility]   MULTIDISCIPLINARY HEART VALVE TEAM  Pt underwent pre TAVR cardiac catheterization today.  I spoke with the pt in regards to next steps in TAVR evaluation.  The pt is due for TAVR CT scans and will require clearance from his nephrologist, Dr [name] at [facility], before he can proceed due to Creatinine >2.0 and GFR less than 30. Per review of Care Everywhere the pt was last seen in October 2019. I advised the pt that he needs to contact [facility] and arrange follow-up with nephrology. Pt agreed with plan.   The pt is also due to follow-up with his dentist for routine cleaning and evaluation.  The pt plans to call his dentist office tomorrow to get an appointment scheduled.

## 2020-10-06 NOTE — Discharge Summary (Signed)
[place] VALVE TEAM  Discharge Summary    Patient ID: Jordan [facility] MRN: [id number]; DOB: March 03, 1940  Admit date: 10/05/2020 Discharge date: 10/06/2020  Primary Care Provider: Burnard Bunting, MD  Primary Cardiologist: Sanda Klein, MD   Discharge Diagnoses    Principal Problem:   Severe aortic stenosis   Allergies Allergies  Allergen Reactions   Amoxicillin Other (See Comments)    Acute interstitial nephritis   Doxycycline Hives   Flomax [Tamsulosin Hcl] Other (See Comments)    Dizzy     Diagnostic Studies/Procedures   10/06/20 RIGHT HEART CATH AND CORONARY ANGIOGRAPHY   Conclusion      Ramus lesion is 60% stenosed.   1st Diag lesion is 90% stenosed.   2nd Diag lesion is 50% stenosed.   Prox LAD to Mid LAD lesion is 30% stenosed.   Mid Cx lesion is 30% stenosed.   1.  Nonobstructive coronary artery disease with separate LAD and left circumflex ostia, patent LAD with mild nonobstructive plaquing, severe ostial diagonal stenoses, patent left circumflex with moderate intermediate branch stenosis and no other significant stenoses, patent nondominant RCA 2.  Known severe aortic stenosis with heavy calcification and restriction of the aortic valve leaflets seen on plain fluoroscopy 3.  Normal right heart hemodynamics with preserved cardiac output, normal pulmonary pressures, and normal LVEDP   Recommendation: 4 hours of postprocedural IV fluid, discharge from the hospital today, staged CTA scans for TAVR planning as an outpatient, cardiac surgical evaluation after CAT scans are done, medical therapy for coronary artery disease.   _____________    History of Present Illness     Jordan [facility] is a 80 y.o. 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    Hospital Course     Consultants: none   L/RHC showed nonobstructive coronary artery disease with separate LAD and left circumflex ostia, patent LAD with mild nonobstructive plaquing, severe ostial diagonal stenoses, patent left circumflex with moderate intermediate branch stenosis and no other significant stenoses, patent nondominant RCA. There was heavy calcification and restriction of the aortic valve leaflets seen on plain fluoroscopy, normal right heart hemodynamics with preserved cardiac output, normal pulmonary pressures, and normal LVEDP. He received IV hydration overnight and post cath. Creat 2.33 today which is around his baseline. Patient has been instructed to follow up with outpatient nephrologist at [facility] (who he hasn't seen since 10/2017) to provide clearance for pre TAVR CT studies.  _____________  Discharge Vitals Blood pressure 135/69, pulse 70, temperature (!) 97.3 F (36.3 C), temperature source Oral, resp. rate 19, height 6\' 1"  (1.854 m), weight 90.9 kg, SpO2 94 %.  Filed Weights   10/05/20 1651 10/06/20 0613  Weight: 94.1 kg 90.9 kg    Labs & Radiologic Studies    CBC Recent Labs    10/05/20 1802  WBC 5.2  NEUTROABS 3.5  HGB 13.9  HCT 44.1  MCV 95.5  PLT 673*   Basic Metabolic Panel Recent Labs    10/05/20 1802 10/06/20 0502  NA 138 138  K 4.2 4.3  CL 104 108  CO2 23 23  GLUCOSE 103* 110*  BUN 44* 43*  CREATININE 2.49* 2.33*  CALCIUM 9.8 9.1   Liver Function Tests Recent Labs    10/05/20 1802  AST 18  ALT 18  ALKPHOS 38  BILITOT 0.7  PROT 6.3*  ALBUMIN 3.5   No results for input(s): LIPASE, AMYLASE in the last 72 hours. Cardiac Enzymes No results for input(s): CKTOTAL, CKMB, CKMBINDEX, TROPONINI in the last 72 hours. BNP Invalid input(s): POCBNP D-Dimer No  results for input(s): DDIMER in the last 72 hours. Hemoglobin A1C No results for input(s): HGBA1C in the last 72 hours. Fasting Lipid Panel No results for input(s): CHOL, HDL, LDLCALC, TRIG, CHOLHDL, LDLDIRECT in the last 72 hours. Thyroid Function Tests No results for input(s): TSH, T4TOTAL, T3FREE, THYROIDAB in the last 72 hours.  Invalid input(s): FREET3 _____________  CARDIAC CATHETERIZATION  Result Date: 10/06/2020   Ramus lesion is 60% stenosed.   1st Diag lesion is 90% stenosed.   2nd Diag lesion is 50% stenosed.   Prox LAD to Mid LAD lesion is 30% stenosed.   Mid Cx lesion is 30% stenosed. 1.  Nonobstructive coronary artery disease with separate LAD and left circumflex ostia, patent LAD with mild nonobstructive plaquing, severe ostial diagonal stenoses, patent left circumflex with moderate intermediate branch stenosis and no other significant stenoses, patent nondominant RCA 2.  Known severe aortic stenosis with heavy calcification and restriction of the aortic valve leaflets seen on plain fluoroscopy 3.  Normal right heart hemodynamics with preserved cardiac output, normal pulmonary pressures, and normal LVEDP Recommendation: 4 hours of postprocedural IV fluid, discharge from the hospital today, staged CTA scans for TAVR planning as an outpatient, cardiac surgical evaluation after CAT scans are done, medical therapy for coronary artery disease.   Disposition   Pt is being discharged home today in good condition.  Follow-up Plans & Appointments       Discharge Medications   Allergies as of 10/06/2020       Reactions   Amoxicillin Other (See Comments)   Acute interstitial nephritis   Doxycycline Hives   Flomax [tamsulosin Hcl] Other (See Comments)   Dizzy        Medication List     STOP taking these medications    ibuprofen 200 MG tablet Commonly known as: ADVIL       TAKE these medications    acetaminophen 500 MG tablet Commonly known as: TYLENOL Take 1,000  mg by mouth 3 (three) times daily as needed (pain).   ALPRAZolam 0.25 MG tablet Commonly known as: XANAX Take 0.25 mg by mouth at bedtime.   diltiazem 180 MG 24 hr capsule Commonly known as: CARDIZEM CD Take 180 mg by mouth daily.   escitalopram 10 MG tablet Commonly known as: LEXAPRO Take 10 mg by mouth every morning.   flecainide 50 MG tablet Commonly known as: TAMBOCOR Take 1/2 (one-half) tablet by mouth twice daily   fluticasone 50 MCG/ACT nasal spray Commonly known as: FLONASE Place 2 sprays into both nostrils in the morning.   hydrALAZINE 50 MG tablet Commonly known as: APRESOLINE Take 50 mg by mouth 2 (two) times daily.   latanoprost 0.005 % ophthalmic solution Commonly known as: XALATAN INSTILL 1 DROP INTO EACH EYE AT BEDTIME   mirtazapine 15 MG tablet Commonly known as: REMERON Take 7.5 mg by mouth at bedtime.   omeprazole 20 MG capsule Commonly known as: PRILOSEC Take 2 capsules (40 mg total) by mouth daily. What changed: how much to take   sodium bicarbonate 650 MG tablet Take 1,300 mg by  mouth 2 (two) times daily.         Outstanding Labs/Studies   none  Duration of Discharge Encounter   Greater than 30 minutes including physician time.  Signed, Angelena Form, PA-C 10/06/2020, 2:12 PM [phone]

## 2020-10-09 DIAGNOSIS — J069 Acute upper respiratory infection, unspecified: Secondary | ICD-10-CM | POA: Diagnosis not present

## 2020-10-09 DIAGNOSIS — R051 Acute cough: Secondary | ICD-10-CM | POA: Diagnosis not present

## 2020-10-10 DIAGNOSIS — Z1152 Encounter for screening for COVID-19: Secondary | ICD-10-CM | POA: Diagnosis not present

## 2020-10-10 DIAGNOSIS — J029 Acute pharyngitis, unspecified: Secondary | ICD-10-CM | POA: Diagnosis not present

## 2020-10-10 DIAGNOSIS — R5383 Other fatigue: Secondary | ICD-10-CM | POA: Diagnosis not present

## 2020-10-10 DIAGNOSIS — R051 Acute cough: Secondary | ICD-10-CM | POA: Diagnosis not present

## 2020-10-10 DIAGNOSIS — J069 Acute upper respiratory infection, unspecified: Secondary | ICD-10-CM | POA: Diagnosis not present

## 2020-10-10 DIAGNOSIS — N184 Chronic kidney disease, stage 4 (severe): Secondary | ICD-10-CM | POA: Diagnosis not present

## 2020-10-10 DIAGNOSIS — R0981 Nasal congestion: Secondary | ICD-10-CM | POA: Diagnosis not present

## 2020-10-10 DIAGNOSIS — I350 Nonrheumatic aortic (valve) stenosis: Secondary | ICD-10-CM | POA: Diagnosis not present

## 2020-10-14 ENCOUNTER — Other Ambulatory Visit: Payer: Self-pay

## 2020-10-14 NOTE — Progress Notes (Signed)
Attempted to enter [facility] Nephrology referral in [place] but I could not locate correct office.  I will fax the pt's records.

## 2020-10-15 ENCOUNTER — Encounter (HOSPITAL_COMMUNITY): Payer: Medicare PPO

## 2020-10-15 ENCOUNTER — Ambulatory Visit (HOSPITAL_COMMUNITY): Payer: Medicare PPO

## 2020-10-17 NOTE — Progress Notes (Signed)
Referral/ otes faxed to Dr. Anthony Sar office.  Fax: [phone]

## 2020-10-22 ENCOUNTER — Encounter (HOSPITAL_COMMUNITY): Payer: Medicare PPO

## 2020-10-22 ENCOUNTER — Telehealth: Payer: Self-pay

## 2020-10-22 DIAGNOSIS — N184 Chronic kidney disease, stage 4 (severe): Secondary | ICD-10-CM

## 2020-10-22 DIAGNOSIS — I350 Nonrheumatic aortic (valve) stenosis: Secondary | ICD-10-CM

## 2020-10-22 NOTE — Telephone Encounter (Signed)
Received fax from [place] that they could schedule next available that would be months. Referral faxed to [facility] at [phone] Called patient to inform him a referral to local nephrologist has been placed.  Left message to call back to confirm.

## 2020-10-25 DIAGNOSIS — Z23 Encounter for immunization: Secondary | ICD-10-CM | POA: Diagnosis not present

## 2020-10-27 DIAGNOSIS — H90A32 Mixed conductive and sensorineural hearing loss, unilateral, left ear with restricted hearing on the contralateral side: Secondary | ICD-10-CM | POA: Diagnosis not present

## 2020-10-27 DIAGNOSIS — H90A21 Sensorineural hearing loss, unilateral, right ear, with restricted hearing on the contralateral side: Secondary | ICD-10-CM | POA: Diagnosis not present

## 2020-10-27 DIAGNOSIS — H6522 Chronic serous otitis media, left ear: Secondary | ICD-10-CM | POA: Diagnosis not present

## 2020-10-27 DIAGNOSIS — H6982 Other specified disorders of Eustachian tube, left ear: Secondary | ICD-10-CM | POA: Diagnosis not present

## 2020-10-30 NOTE — Telephone Encounter (Signed)
The patient is scheduled for Nephrology evaluation 11/03/2020. Per request, will tentatively arrange scans after appointment and send instructions.

## 2020-10-31 ENCOUNTER — Ambulatory Visit (HOSPITAL_COMMUNITY)
Admission: RE | Admit: 2020-10-31 | Discharge: 2020-10-31 | Disposition: A | Discharge status: Home / Self Care | Payer: Medicare PPO | Source: Ambulatory Visit | Attending: Cardiovascular Disease | Admitting: Cardiovascular Disease

## 2020-10-31 ENCOUNTER — Other Ambulatory Visit: Payer: Self-pay

## 2020-10-31 DIAGNOSIS — I350 Nonrheumatic aortic (valve) stenosis: Secondary | ICD-10-CM | POA: Diagnosis not present

## 2020-10-31 NOTE — Progress Notes (Signed)
Carotid artery duplex has been completed. Preliminary results can be found in CV Proc through chart review.   10/31/20 10:07 AM [facility]nell RVT

## 2020-11-03 DIAGNOSIS — N390 Urinary tract infection, site not specified: Secondary | ICD-10-CM | POA: Diagnosis not present

## 2020-11-03 DIAGNOSIS — E872 Acidosis, unspecified: Secondary | ICD-10-CM | POA: Diagnosis not present

## 2020-11-03 DIAGNOSIS — N184 Chronic kidney disease, stage 4 (severe): Secondary | ICD-10-CM | POA: Diagnosis not present

## 2020-11-03 DIAGNOSIS — I350 Nonrheumatic aortic (valve) stenosis: Secondary | ICD-10-CM | POA: Diagnosis not present

## 2020-11-03 DIAGNOSIS — Z9889 Other specified postprocedural states: Secondary | ICD-10-CM | POA: Diagnosis not present

## 2020-11-03 DIAGNOSIS — I129 Hypertensive chronic kidney disease with stage 1 through stage 4 chronic kidney disease, or unspecified chronic kidney disease: Secondary | ICD-10-CM | POA: Diagnosis not present

## 2020-11-04 ENCOUNTER — Other Ambulatory Visit: Payer: Self-pay

## 2020-11-04 DIAGNOSIS — I350 Nonrheumatic aortic (valve) stenosis: Secondary | ICD-10-CM

## 2020-11-04 DIAGNOSIS — N289 Disorder of kidney and ureter, unspecified: Secondary | ICD-10-CM

## 2020-11-06 DIAGNOSIS — H6522 Chronic serous otitis media, left ear: Secondary | ICD-10-CM | POA: Diagnosis not present

## 2020-11-06 DIAGNOSIS — H6982 Other specified disorders of Eustachian tube, left ear: Secondary | ICD-10-CM | POA: Diagnosis not present

## 2020-11-06 MED ORDER — METOPROLOL TARTRATE 25 MG PO TABS: ORAL_TABLET | ORAL | 0 refills | Status: DC

## 2020-11-06 NOTE — Addendum Note (Signed)
Addended by: Harland German A on: 11/06/2020 03:51 PM   Modules accepted: Orders

## 2020-11-07 NOTE — Telephone Encounter (Signed)
Reviewed instructions for upcoming appointments in detail with patient. He was grateful for call and agrees with plan.

## 2020-11-12 ENCOUNTER — Encounter: Payer: Medicare PPO | Admitting: Surgery

## 2020-11-17 ENCOUNTER — Other Ambulatory Visit: Payer: Self-pay | Admitting: Cardiovascular Disease

## 2020-11-20 ENCOUNTER — Other Ambulatory Visit: Payer: Self-pay

## 2020-11-20 ENCOUNTER — Ambulatory Visit (HOSPITAL_COMMUNITY)
Admission: RE | Admit: 2020-11-20 | Discharge: 2020-11-20 | Disposition: A | Discharge status: Home / Self Care | Payer: Medicare PPO | Source: Ambulatory Visit | Attending: Cardiovascular Disease | Admitting: Cardiovascular Disease

## 2020-11-20 DIAGNOSIS — N289 Disorder of kidney and ureter, unspecified: Secondary | ICD-10-CM

## 2020-11-20 DIAGNOSIS — I350 Nonrheumatic aortic (valve) stenosis: Secondary | ICD-10-CM | POA: Insufficient documentation

## 2020-11-20 DIAGNOSIS — K8020 Calculus of gallbladder without cholecystitis without obstruction: Secondary | ICD-10-CM | POA: Diagnosis not present

## 2020-11-20 DIAGNOSIS — K449 Diaphragmatic hernia without obstruction or gangrene: Secondary | ICD-10-CM | POA: Diagnosis not present

## 2020-11-20 DIAGNOSIS — I517 Cardiomegaly: Secondary | ICD-10-CM | POA: Diagnosis not present

## 2020-11-20 LAB — BASIC METABOLIC PANEL
Anion gap: 9 (ref 5–15)
BUN: 50 mg/dL — ABNORMAL HIGH (ref 8–23)
CO2: 20 mmol/L — ABNORMAL LOW (ref 22–32)
Calcium: 8.8 mg/dL — ABNORMAL LOW (ref 8.9–10.3)
Chloride: 106 mmol/L (ref 98–111)
Creatinine, Ser: 2.27 mg/dL — ABNORMAL HIGH (ref 0.61–1.24)
GFR, Estimated: 28 mL/min — ABNORMAL LOW (ref >60)
Glucose, Bld: 107 mg/dL — ABNORMAL HIGH (ref 70–99)
Potassium: 4.3 mmol/L (ref 3.5–5.1)
Sodium: 135 mmol/L (ref 135–145)

## 2020-11-20 MED ORDER — IOHEXOL 350 MG/ML SOLN
80.0000 mL | Freq: Once | INTRAVENOUS | Status: AC | PRN
  Administered 2020-11-20: 80 mL via INTRAVENOUS

## 2020-11-20 MED ORDER — SODIUM CHLORIDE 0.9 % WEIGHT BASED INFUSION
3.0000 mL/kg/h | INTRAVENOUS | Status: AC
  Administered 2020-11-20: 3 mL/kg/h via INTRAVENOUS

## 2020-11-20 MED ORDER — SODIUM CHLORIDE 0.9 % WEIGHT BASED INFUSION: 1.0000 mL/kg/h | INTRAVENOUS | Status: DC

## 2020-11-21 ENCOUNTER — Telehealth: Payer: Self-pay

## 2020-11-21 NOTE — Telephone Encounter (Signed)
I contacted the pt and made him aware of incidental findings noted on TAVR CT scans.  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 Urologic consultation is recommended for further clinical evaluation.   The pt has a significant urologic history and is followed by Dr Sherre Lain ([facility]) at [facility].  I have called Dr Rogelia Rohrer office and left a message on Myrtie Hawk voicemail to review CT in [place] and arrange follow-up for the pt.  The pt is aware of this plan.  Currently the pt's is scheduled with Dr Cyndia Bent for TAVR evaluation on 12/24/20.

## 2020-11-24 NOTE — Telephone Encounter (Signed)
CT scan results also forwarded to Dr Pearson Grippe for review.

## 2020-11-30 DIAGNOSIS — S91302A Unspecified open wound, left foot, initial encounter: Secondary | ICD-10-CM | POA: Diagnosis not present

## 2020-12-04 ENCOUNTER — Other Ambulatory Visit: Payer: Self-pay

## 2020-12-04 ENCOUNTER — Other Ambulatory Visit: Payer: Medicare PPO | Admitting: *Deleted

## 2020-12-04 DIAGNOSIS — N289 Disorder of kidney and ureter, unspecified: Secondary | ICD-10-CM

## 2020-12-04 DIAGNOSIS — I350 Nonrheumatic aortic (valve) stenosis: Secondary | ICD-10-CM | POA: Diagnosis not present

## 2020-12-04 LAB — BASIC METABOLIC PANEL
BUN/Creatinine Ratio: 21 (ref 10–24)
BUN: 49 mg/dL — ABNORMAL HIGH (ref 8–27)
CO2: 21 mmol/L (ref 20–29)
Calcium: 10 mg/dL (ref 8.6–10.2)
Chloride: 107 mmol/L — ABNORMAL HIGH (ref 96–106)
Creatinine, Ser: 2.31 mg/dL — ABNORMAL HIGH (ref 0.76–1.27)
Glucose: 124 mg/dL — ABNORMAL HIGH (ref 70–99)
Potassium: 4.4 mmol/L (ref 3.5–5.2)
Sodium: 143 mmol/L (ref 134–144)
eGFR: 28 mL/min/{1.73_m2} — ABNORMAL LOW (ref >59)

## 2020-12-04 NOTE — Telephone Encounter (Signed)
Messages have been left today with Dr Adin Hector office as well as Dr Rogelia Rohrer office in regards to incidental findings noted on 10/27 CTA.  Will await return calls to determine next steps.

## 2020-12-05 NOTE — Telephone Encounter (Signed)
This encounter was created in error - please disregard.

## 2020-12-05 NOTE — Telephone Encounter (Signed)
Spoke with Caryl Pina, Dr. Adin Hector nurse. She reports Dr. Joelyn Oms personally called and spoke with the patient this morning. Caryl Pina is unaware of specifics during the conversation, but they did place an ASAP referral back to Dr. [name] for evaluation. She states she will ensure an appointment is made.

## 2020-12-24 ENCOUNTER — Ambulatory Visit: Payer: Medicare PPO

## 2020-12-24 ENCOUNTER — Encounter: Payer: Medicare PPO | Admitting: Surgery

## 2021-01-07 DIAGNOSIS — C661 Malignant neoplasm of right ureter: Secondary | ICD-10-CM | POA: Diagnosis not present

## 2021-01-07 DIAGNOSIS — I350 Nonrheumatic aortic (valve) stenosis: Secondary | ICD-10-CM | POA: Diagnosis not present

## 2021-01-07 DIAGNOSIS — C662 Malignant neoplasm of left ureter: Secondary | ICD-10-CM | POA: Diagnosis not present

## 2021-01-20 ENCOUNTER — Encounter: Payer: Self-pay | Admitting: Surgery

## 2021-01-20 ENCOUNTER — Other Ambulatory Visit: Payer: Self-pay

## 2021-01-20 ENCOUNTER — Institutional Professional Consult (permissible substitution): Payer: Medicare PPO | Admitting: Surgery

## 2021-01-20 VITALS — BP 192/94 | HR 75 | Resp 20 | Ht 73.0 in | Wt 204.0 lb

## 2021-01-20 DIAGNOSIS — I350 Nonrheumatic aortic (valve) stenosis: Secondary | ICD-10-CM

## 2021-01-20 NOTE — Progress Notes (Signed)
Pre Surgical Assessment: 5 M Walk Test  20M=16.87ft  5 Meter Walk Test- trial 1: 6.86 seconds 5 Meter Walk Test- trial 2: 6.47 seconds 5 Meter Walk Test- trial 3: 4.36 seconds 5 Meter Walk Test Average: 5.89 seconds

## 2021-01-20 NOTE — Progress Notes (Signed)
Patient ID: Jordan Oconnell, male   DOB: Oct 25, 1940, 80 y.o.   MRN: [id number]  [facility]IDISCIPLIN[facility] VALVE CLINIC         [address]       [place] [address]             [phone]          CARDIOTHORACIC SURGERY CONSULTATION REPORT  PCP is Burnard Bunting, MD Referring Provider is Dr. Sherren Mocha Primary Cardiologist is Sanda Klein, MD  Reason for consultation:  Severe aortic stenosis  HPI:  The patient is an 80 year old gentleman with a history of hypertension, hyperlipidemia, urothelial cancer status post cystectomy and bilateral ureterectomy in 2015 with ileal conduit followed by left nephrectomy in 2018, stage IV chronic kidney disease with 1 remaining kidney, paroxysmal atrial fibrillation not on anticoagulation due to recurrent hematuria, and severe aortic stenosis that has been followed for several years.  He is a very active 80 year old and walks at least 3 miles 6 days/week.  He was asymptomatic until recently but now notes some exertional upper chest tightness and shortness of breath over the past couple months.  He denies any fatigue.  He denies orthopnea.  Has had no peripheral edema.  He denies any dizziness or syncope.  He is divorced and here today with his friend Jordan Oconnell. Past Medical History:  Diagnosis Date   A-fib [facility])    with ventricular response   Bladder cancer ([place])    BPH (benign prostatic hyperplasia)    Colon polyps    Diverticulosis    GERD (gastroesophageal reflux disease)    Hyperlipidemia    pt denies   Hypertension    Renal cell carcinoma ([facility]) 2008   left   Severe aortic stenosis 10/05/2020    Past Surgical History:  Procedure Laterality Date   APPENDECTOMY     BLADDER SURGERY  2008   transurethral resection/resection of prostatic urethra   BOWEL RESECTION     ESOPHAGOGASTRODUODENOSCOPY N/A 07/24/2013   Procedure: ESOPHAGOGASTRODUODENOSCOPY (EGD);  Surgeon: [name], MD;   Location: [name];  Service: Endoscopy;  Laterality: N/A;   laparoscopic surgery  2012   laser   (?)   NEPHRECTOMY  2007   partial, left   NEPHROSTOMY  2011   stent   NM MYOCAR PERF WALL MOTION  09/08/2010   Normal   RIGHT HEART CATH AND CORONARY ANGIOGRAPHY N/A 10/06/2020   Procedure: RIGHT HEART CATH AND CORONARY ANGIOGRAPHY;  Surgeon: Sherren Mocha, MD;  Location: [facility];  Service: Cardiovascular;  Laterality: N/A;   ROBOT ASSISTED LAPAROSCOPIC COMPLETE CYSTECT ILEAL CONDUIT     US ECHOCARDIOGRAPHY  1/51/7616   mild diastolic dysfunction,mild dilated LA,mild MR,AI,mildly dilated aortic root    Family History  Problem Relation Age of Onset   Ovarian cancer Mother    Colon cancer Mother    Colon cancer Father    Crohn's disease Son    Breast cancer Daughter    Colon cancer Brother     Social History   Socioeconomic History   Marital status: Divorced    Spouse name: Not on file   Number of children: 2   Years of education: Not on file   Highest education level: Not on file  Occupational History   Occupation: Pharmacist, hospital  Tobacco Use   Smoking status: Never   Smokeless tobacco: Never  Substance and Sexual Activity   Alcohol use: Yes    Comment: occasionally  Drug use: No   Sexual activity: Not on file  Other Topics Concern   Not on file  Social History Narrative   Not on file   Social Determinants of Health   Financial Resource Strain: Not on file  Food Insecurity: Not on file  Transportation Needs: Not on file  Physical Activity: Not on file  Stress: Not on file  Social Connections: Not on file  Intimate Partner Violence: Not on file    Prior to Admission medications   Medication Sig Start Date End Date Taking? Authorizing Provider  ALPRAZolam (XANAX) 0.25 MG tablet Take 0.25 mg by mouth at bedtime.   Yes [provider]  diltiazem (CARDIZEM CD) 180 MG 24 hr capsule Take 180 mg by mouth daily. 09/29/20  Yes [provider]   escitalopram (LEXAPRO) 10 MG tablet Take 10 mg by mouth every morning. 11/08/12  Yes [provider]  flecainide (TAMBOCOR) 50 MG tablet Take 1/2 (one-half) tablet by mouth twice daily 11/17/20  Yes Croitoru, Mihai, MD  hydrALAZINE (APRESOLINE) 50 MG tablet Take 50 mg by mouth 2 (two) times daily.   Yes [provider]  latanoprost (XALATAN) 0.005 % ophthalmic solution INSTILL 1 DROP INTO EACH EYE AT BEDTIME 09/08/20  Yes [provider]  mirtazapine (REMERON) 15 MG tablet Take 7.5 mg by mouth at bedtime. 08/09/20  Yes [provider]  omeprazole (PRILOSEC) 20 MG capsule Take 2 capsules (40 mg total) by mouth daily. Patient taking differently: Take 20 mg by mouth daily. 07/24/13  Yes Leanna Battles, MD  sodium bicarbonate 650 MG tablet Take 1,300 mg by mouth 2 (two) times daily. 09/01/20  Yes [provider]    Current Outpatient Medications  Medication Sig Dispense Refill   ALPRAZolam (XANAX) 0.25 MG tablet Take 0.25 mg by mouth at bedtime.     diltiazem (CARDIZEM CD) 180 MG 24 hr capsule Take 180 mg by mouth daily.     escitalopram (LEXAPRO) 10 MG tablet Take 10 mg by mouth every morning.     flecainide (TAMBOCOR) 50 MG tablet Take 1/2 (one-half) tablet by mouth twice daily 90 tablet 0   hydrALAZINE (APRESOLINE) 50 MG tablet Take 50 mg by mouth 2 (two) times daily.     latanoprost (XALATAN) 0.005 % ophthalmic solution INSTILL 1 DROP INTO EACH EYE AT BEDTIME     mirtazapine (REMERON) 15 MG tablet Take 7.5 mg by mouth at bedtime.     omeprazole (PRILOSEC) 20 MG capsule Take 2 capsules (40 mg total) by mouth daily. (Patient taking differently: Take 20 mg by mouth daily.) 60 capsule 12   sodium bicarbonate 650 MG tablet Take 1,300 mg by mouth 2 (two) times daily.     No current facility-administered medications for this visit.    Allergies  Allergen Reactions   Amoxicillin Other (See Comments)    Acute interstitial nephritis   Doxycycline Hives    Flomax [Tamsulosin Hcl] Other (See Comments)    Dizzy       Review of Systems:   General:  normal appetite, normal energy, no weight gain, no weight loss, no fever  Cardiac:  + chest pain with exertion, no chest pain at rest, +SOB with moderate exertion, no resting SOB, no PND, no orthopnea, no palpitations, no arrhythmia, + atrial fibrillation, no LE edema, no dizzy spells, no syncope  Respiratory:  + exertional shortness of breath, no home oxygen, no productive cough, no dry cough, no bronchitis, no wheezing, no hemoptysis, no asthma, no  pain with inspiration or cough, no sleep apnea, no CPAP at night  GI:   no difficulty swallowing, no reflux, no frequent heartburn, no hiatal hernia, no abdominal pain, no constipation, no diarrhea, no hematochezia, no hematemesis, no melena  GU:   no dysuria,  no frequency, no urinary tract infection, + hematuria, no enlarged prostate, no kidney stones, + kidney disease  Vascular:  no pain suggestive of claudication, no pain in feet, no leg cramps, no varicose veins, no DVT, no non-healing foot ulcer  Neuro:   no stroke, no TIA's, no seizures, no headaches, no temporary blindness one eye,  no slurred speech, no peripheral neuropathy, no chronic pain, no instability of gait, no memory/cognitive dysfunction  Musculoskeletal: no arthritis, no joint swelling, no myalgias, no difficulty walking, normal mobility   Skin:   no rash, no itching, no skin infections, no pressure sores or ulcerations  Psych:   no anxiety, no depression, no nervousness, no unusual recent stress  Eyes:   no blurry vision, no floaters, no recent vision changes, no glasses or contacts  ENT:   + hearing loss, no loose or painful teeth, no dentures, last saw dentist October 2022  Hematologic:  + easy bruising, no abnormal bleeding, no clotting disorder, no frequent epistaxis  Endocrine:  no diabetes, does not check CBG's at home     Physical Exam:   BP (!) 192/94 (BP Location: Left  Arm, Patient Position: Sitting, Cuff Size: Normal)    Oconnell 74    Resp 20    Ht 6\' 1"  (1.854 m)    Wt 92.5 kg    SpO2 97% Comment: RA   BMI 26.91 kg/m   General:  Fit, well-appearing  HEENT:  Unremarkable, NCAT, PERLA, EOMI  Neck:   no JVD, no bruits, no adenopathy   Chest:   clear to auscultation, symmetrical breath sounds, no wheezes, no rhonchi   CV:   RRR, 3/6 systolic murmur RSB, no diastolic murmur  Abdomen:  soft, non-tender, no masses   Extremities:  warm, well-perfused, pulses palpable at ankle, no lower extremity edema  Rectal/GU  Deferred  Neuro:   Grossly non-focal and symmetrical throughout  Skin:   Clean and dry, no rashes, no breakdown  Diagnostic Tests:  ECHOCARDIOGRAM REPORT         Patient Name:   Hunter GRAE CANNATA Date of Exam: 05/27/2020  Medical Rec #:  [id number]         Height:       72.0 in  Accession #:    [id number]        Weight:       201.0 lb  Date of Birth:  November 23, 1940         BSA:          2.135 m  Patient Age:    71 years          BP:           130/80 mmHg  Patient Gender: M                 HR:           73 bpm.  Exam Location:  [address]   Procedure: 2D Echo, 3D Echo, Cardiac Doppler, Color Doppler and Strain  Analysis   Indications:    I35 Aortic stenosis     History:        Patient has prior history of Echocardiogram examinations,  most  recent 04/04/2019. Aortic Valve Disease, Arrythmias:Atrial                  Fibrillation, Signs/Symptoms:Syncope; Risk  Factors:Hypertension.                  CKD stage 4. Bladder cancer. Anemia.     Sonographer:    Jessee Avers, RDCS  Referring Phys: [place]     1. The aortic valve is calcified. Aortic valve regurgitation is mild.  Severe aortic valve stenosis. Aortic valve area, by VTI measures 0.92 cm.  Aortic valve mean gradient measures 43.5 mmHg. Aortic valve Vmax measures  4.21 m/s.   2. Left ventricular ejection fraction, by estimation, is 55 to  60%. The  left ventricle has normal function. The left ventricle has no regional  wall motion abnormalities. There is moderate concentric left ventricular  hypertrophy. Left ventricular  diastolic parameters are consistent with Grade I diastolic dysfunction  (impaired relaxation).   3. Right ventricular systolic function is normal. The right ventricular  size is normal. Tricuspid regurgitation signal is inadequate for assessing  PA pressure.   4. The mitral valve is grossly normal. Trivial mitral valve  regurgitation. No evidence of mitral stenosis.   5. There is mild dilatation of the ascending aorta, measuring 43 mm.   6. The inferior vena cava is normal in size with greater than 50%  respiratory variability, suggesting right atrial pressure of 3 mmHg.   Comparison(s): Changes from prior study are noted. AS is now severe.   FINDINGS   Left Ventricle: Left ventricular ejection fraction, by estimation, is 55  to 60%. The left ventricle has normal function. The left ventricle has no  regional wall motion abnormalities. Global longitudinal strain performed  but not reported based on  interpreter judgement due to suboptimal tracking. The left ventricular  internal cavity size was normal in size. There is moderate concentric left  ventricular hypertrophy. Left ventricular diastolic parameters are  consistent with Grade I diastolic  dysfunction (impaired relaxation).   Right Ventricle: The right ventricular size is normal. No increase in  right ventricular wall thickness. Right ventricular systolic function is  normal. Tricuspid regurgitation signal is inadequate for assessing PA  pressure.   Left Atrium: Left atrial size was normal in size.   Right Atrium: Right atrial size was normal in size.   Pericardium: Trivial pericardial effusion is present. Presence of  pericardial fat pad.   Mitral Valve: The mitral valve is grossly normal. Trivial mitral valve  regurgitation. No  evidence of mitral valve stenosis.   Tricuspid Valve: The tricuspid valve is grossly normal. Tricuspid valve  regurgitation is mild . No evidence of tricuspid stenosis.   Aortic Valve: The aortic valve is calcified. Aortic valve regurgitation is  mild. Aortic regurgitation PHT measures 460 msec. Severe aortic stenosis  is present. Aortic valve mean gradient measures 43.5 mmHg. Aortic valve  peak gradient measures 70.9 mmHg.  Aortic valve area, by VTI measures 0.92 cm.   Pulmonic Valve: The pulmonic valve was grossly normal. Pulmonic valve  regurgitation is not visualized. No evidence of pulmonic stenosis.   Aorta: The aortic root is normal in size and structure. There is mild  dilatation of the ascending aorta, measuring 43 mm.   Venous: The inferior vena cava is normal in size with greater than 50%  respiratory variability, suggesting right atrial pressure of 3 mmHg.   IAS/Shunts: The atrial septum is grossly normal.  LEFT VENTRICLE  PLAX 2D  LVIDd:         4.50 cm  Diastology  LVIDs:         2.90 cm  LV e' medial:    3.32 cm/s  LV PW:         1.60 cm  LV E/e' medial:  17.8  LV IVS:        1.60 cm  LV e' lateral:   3.15 cm/s  LVOT diam:     2.30 cm  LV E/e' lateral: 18.8  LV SV:         85  LV SV Index:   40       2D Longitudinal Strain  LVOT Area:     4.15 cm 2D Strain GLS (A2C):   -18.1 %                          2D Strain GLS (A3C):   -18.1 %                          2D Strain GLS (A4C):   -10.2 %                          2D Strain GLS Avg:     -15.5 %   RIGHT VENTRICLE  RV Basal diam:  3.70 cm  RV S prime:     11.70 cm/s  TAPSE (M-mode): 1.9 cm   LEFT ATRIUM             Index       RIGHT ATRIUM           Index  LA diam:        4.40 cm 2.06 cm/m  RA Pressure: 3.00 mmHg  LA Vol (A2C):   55.3 ml 25.90 ml/m RA Area:     14.40 cm  LA Vol (A4C):   35.8 ml 16.77 ml/m RA Volume:   29.60 ml  13.86 ml/m  LA Biplane Vol: 44.5 ml 20.84 ml/m   AORTIC VALVE  AV Area  (Vmax):    0.84 cm  AV Area (Vmean):   0.85 cm  AV Area (VTI):     0.92 cm  AV Vmax:           421.03 cm/s  AV Vmean:          286.080 cm/s  AV VTI:            0.930 m  AV Peak Grad:      70.9 mmHg  AV Mean Grad:      43.5 mmHg  LVOT Vmax:         85.50 cm/s  LVOT Vmean:        58.300 cm/s  LVOT VTI:          0.205 m  LVOT/AV VTI ratio: 0.22  AI PHT:            460 msec     AORTA  Ao Root diam: 4.00 cm  Ao Asc diam:  4.30 cm   MITRAL VALVE               TRICUSPID VALVE                             Estimated RAP:  3.00 mmHg     MV  E velocity: 59.10 cm/s  SHUNTS  MV A velocity: 95.20 cm/s  Systemic VTI:  0.20 m  MV E/A ratio:  0.62        Systemic Diam: 2.30 cm   Eleonore Chiquito MD  Electronically signed by Eleonore Chiquito MD  Signature Date/Time: 05/27/2020/2:39:57 PM         Final     Physicians  Panel Physicians Referring Physician Case Authorizing Physician  Sherren Mocha, MD (Primary)     Procedures  RIGHT HEART CATH AND CORONARY ANGIOGRAPHY   Conclusion      Ramus lesion is 60% stenosed.   1st Diag lesion is 90% stenosed.   2nd Diag lesion is 50% stenosed.   Prox LAD to Mid LAD lesion is 30% stenosed.   Mid Cx lesion is 30% stenosed.   1.  Nonobstructive coronary artery disease with separate LAD and left circumflex ostia, patent LAD with mild nonobstructive plaquing, severe ostial diagonal stenoses, patent left circumflex with moderate intermediate branch stenosis and no other significant stenoses, patent nondominant RCA 2.  Known severe aortic stenosis with heavy calcification and restriction of the aortic valve leaflets seen on plain fluoroscopy 3.  Normal right heart hemodynamics with preserved cardiac output, normal pulmonary pressures, and normal LVEDP   Recommendation: 4 hours of postprocedural IV fluid, discharge from the hospital today, staged CTA scans for TAVR planning as an outpatient, cardiac surgical evaluation after CAT scans are done, medical  therapy for coronary artery disease.   Procedural Details  Technical Details INDICATION: Severe symptomatic aortic stenosis.  Mr. Nuttall is an 80 year old gentleman with stage IV chronic kidney disease who was recently evaluated for treatment options for severe aortic stenosis.  He was brought in last night for prehydration for cardiac catheterization.  Creatinine is found to be stable.  He presents for right and left heart catheterization today.  PROCEDURAL DETAILS: Ultrasound guidance is used to access the right brachial vein via a front wall puncture.  Ultrasound images are digitally captured and stored in the patient's chart.  A 4/5 French slender sheath is inserted.  The right wrist was then prepped, draped, and anesthetized with 1% lidocaine. Using ultrasound guidance a 5/6 French Slender sheath was placed in the right radial artery. Intra-arterial verapamil was administered through the radial artery sheath. IV heparin was administered after a JR4 catheter was advanced into the central aorta. A Swan-Ganz catheter was used for the right heart catheterization. Standard protocol was followed for recording of right heart pressures and sampling of oxygen saturations. Fick cardiac output was calculated. Standard Judkins catheters were used for selective coronary angiography.  Attempts were not made to cross the aortic valve.  The right subclavian artery is tortuous and catheter manipulation was difficult.  There were no immediate procedural complications. The patient was transferred to the post catheterization recovery area for further monitoring.      Estimated blood loss <50 mL.   During this procedure medications were administered to achieve and maintain moderate conscious sedation while the patient's heart rate, blood pressure, and oxygen saturation were continuously monitored and I was present face-to-face 100% of this time.   Medications (Filter: Administrations occurring from 0722 to 0833 on  10/06/20)  important  Continuous medications are totaled by the amount administered until 10/06/20 0833.   Heparin (Porcine) in NaCl 1000-0.9 UT/500ML-% SOLN (mL) Total volume:  1,500 mL Date/Time Rate/Dose/Volume Action   10/06/20 0739 500 mL Given   0739 500 mL Given   0748 500 mL Given  fentaNYL (SUBLIMAZE) injection (mcg) Total dose:  25 mcg Date/Time Rate/Dose/Volume Action   10/06/20 0748 25 mcg Given    midazolam (VERSED) injection (mg) Total dose:  2 mg Date/Time Rate/Dose/Volume Action   10/06/20 0748 1 mg Given   0756 1 mg Given    lidocaine (PF) (XYLOCAINE) 1 % injection (mL) Total volume:  4 mL Date/Time Rate/Dose/Volume Action   10/06/20 0748 4 mL Given    Radial Cocktail/Verapamil only (mL) Total volume:  10 mL Date/Time Rate/Dose/Volume Action   10/06/20 0754 10 mL Given    heparin sodium (porcine) injection (Units) Total dose:  5,000 Units Date/Time Rate/Dose/Volume Action   10/06/20 0801 5,000 Units Given    iohexol (OMNIPAQUE) 350 MG/ML injection (mL) Total volume:  30 mL Date/Time Rate/Dose/Volume Action   10/06/20 0831 30 mL Given    acetaminophen (TYLENOL) tablet 650 mg (mg) Total dose:  Cannot be calculated* Dosing weight:  94.1 *Administration dose not documented Date/Time Rate/Dose/Volume Action   10/06/20 0722 *Not included in total MAR Hold    ALPRAZolam (XANAX) tablet 0.25 mg (mg) Total dose:  Cannot be calculated* Dosing weight:  94.1 *Administration dose not documented Date/Time Rate/Dose/Volume Action   10/06/20 0722 *Not included in total MAR Hold    ALPRAZolam (XANAX) tablet 0.25 mg (mg) Total dose:  Cannot be calculated* *Administration dose not documented Date/Time Rate/Dose/Volume Action   10/06/20 0722 *Not included in total MAR Hold    diltiazem (CARDIZEM CD) 24 hr capsule 180 mg (mg) Total dose:  Cannot be calculated* Dosing weight:  94.1 *Administration dose not documented Date/Time Rate/Dose/Volume Action    10/06/20 0722 *Not included in total MAR Hold    enoxaparin (LOVENOX) injection 40 mg (mg) Total dose:  Cannot be calculated* Dosing weight:  94.1 *Administration dose not documented Date/Time Rate/Dose/Volume Action   10/06/20 0722 *Not included in total MAR Hold    escitalopram (LEXAPRO) tablet 10 mg (mg) Total dose:  Cannot be calculated* *Administration dose not documented Date/Time Rate/Dose/Volume Action   10/06/20 0722 *Not included in total MAR Hold    flecainide (TAMBOCOR) tablet 25 mg (mg) Total dose:  Cannot be calculated* *Administration dose not documented Date/Time Rate/Dose/Volume Action   10/06/20 0722 *Not included in total MAR Hold    fluticasone (FLONASE) 50 MCG/ACT nasal spray 2 spray (spray) Total dose:  Cannot be calculated* *Administration dose not documented Date/Time Rate/Dose/Volume Action   10/06/20 0722 *Not included in total MAR Hold    hydrALAZINE (APRESOLINE) tablet 50 mg (mg) Total dose:  Cannot be calculated* Dosing weight:  94.1 *Administration dose not documented Date/Time Rate/Dose/Volume Action   10/06/20 0722 *Not included in total MAR Hold    latanoprost (XALATAN) 0.005 % ophthalmic solution 1 drop (drop) Total dose:  Cannot be calculated* *Administration dose not documented Date/Time Rate/Dose/Volume Action   10/06/20 0722 *Not included in total MAR Hold    mirtazapine (REMERON) tablet 7.5 mg (mg) Total dose:  Cannot be calculated* Dosing weight:  94.1 *Administration dose not documented Date/Time Rate/Dose/Volume Action   10/06/20 0722 *Not included in total MAR Hold    nitroGLYCERIN (NITROSTAT) SL tablet 0.4 mg (mg) Total dose:  Cannot be calculated* Dosing weight:  94.1 *Administration dose not documented Date/Time Rate/Dose/Volume Action   10/06/20 0722 *Not included in total MAR Hold    ondansetron (ZOFRAN) injection 4 mg (mg) Total dose:  Cannot be calculated* Dosing weight:  94.1 *Administration dose not  documented Date/Time Rate/Dose/Volume Action   10/06/20 0722 *Not included in total [facility]  Hold    pantoprazole (PROTONIX) EC tablet 40 mg (mg) Total dose:  Cannot be calculated* Dosing weight:  94.1 *Administration dose not documented Date/Time Rate/Dose/Volume Action   10/06/20 0722 *Not included in total MAR Hold    sodium bicarbonate tablet 1,300 mg (mg) Total dose:  Cannot be calculated* Dosing weight:  94.1 *Administration dose not documented Date/Time Rate/Dose/Volume Action   10/06/20 0722 *Not included in total MAR Hold    zolpidem (AMBIEN) tablet 5 mg (mg) Total dose:  Cannot be calculated* Dosing weight:  94.1 *Administration dose not documented Date/Time Rate/Dose/Volume Action   10/06/20 0722 *Not included in total MAR Hold    Sedation Time  Sedation Time Physician-1: 27 minutes 29 seconds Contrast  Medication Name Total Dose  iohexol (OMNIPAQUE) 350 MG/ML injection 30 mL   Radiation/Fluoro  Fluoro time: 7.3 (min) DAP: 12519 (mGycm2) Cumulative Air Kerma: 369 (mGy) Coronary Findings  Diagnostic Dominance: Left Left Main  Vessel is large. The vessel exhibits minimal luminal irregularities.    Left Anterior Descending  There is mild diffuse disease throughout the vessel. The LAD reaches the apex. The LAD is patent throughout its course with mild diffuse nonobstructive plaquing, calcified vessel. The first diagonal is very small in caliber and has a tight ostial stenosis of 90%. The second diagonal is a moderate caliber vessel with 50 to 60% ostial stenosis.  Prox LAD to Mid LAD lesion is 30% stenosed.    First Diagonal Branch  Vessel is small in size.  1st Diag lesion is 90% stenosed.    Second Diagonal Branch  2nd Diag lesion is 50% stenosed.    Ramus Intermedius  Ramus lesion is 60% stenosed.    Left Circumflex  There is mild diffuse disease throughout the vessel. The circumflex is a large, dominant vessel. There is an intermediate branch with  moderate diffuse 60% stenosis. The AV circumflex and its OM/PLA/PDA branches are all patent with mild nonobstructive plaquing.  Mid Cx lesion is 30% stenosed.    Right Coronary Artery  Vessel is small. There is mild diffuse disease throughout the vessel. The vessel is moderately calcified. Small, nondominant vessel    Intervention   No interventions have been documented.   Coronary Diagrams  Diagnostic Dominance: Left Intervention  Implants     No implant documentation for this case.   Syngo Images   Show images for CARDIAC CATHETERIZATION Images on Long Term Storage   Show images for Miguel, Christiana Link to Procedure Log  Procedure Log    Hemo Data  Flowsheet Row Most Recent Value  Fick Cardiac Output 5.69 L/min  Fick Cardiac Output Index 2.61 (L/min)/BSA  RA A Wave 6 mmHg  RA V Wave 5 mmHg  RA Mean 5 mmHg  RV Systolic Pressure 26 mmHg  RV Diastolic Pressure 1 mmHg  RV EDP 4 mmHg  PA Systolic Pressure 26 mmHg  PA Diastolic Pressure 9 mmHg  PA Mean 18 mmHg  PW A Wave 16 mmHg  PW V Wave 15 mmHg  PW Mean 14 mmHg  AO Systolic Pressure 829 mmHg  AO Diastolic Pressure 80 mmHg  AO Mean 111 mmHg  QP/QS 1  TPVR Index 6.91 HRUI  TSVR Index 43.39 HRUI  PVR SVR Ratio 0.04  TPVR/TSVR Ratio 0.16    ADDENDUM REPORT: 11/20/2020 12:45   CLINICAL DATA:  Severe Aortic Stenosis.   EXAM: Cardiac TAVR CT   TECHNIQUE: A non-contrast, gated CT scan was obtained with axial slices of 3 mm through the  heart for aortic valve calcium scoring. A 100 kV retrospective, gated, contrast cardiac scan was obtained. Gantry rotation speed was 250 msecs and collimation was 0.6 mm. Nitroglycerin was not given. The 3D data set was reconstructed in 5% intervals of the 0-95% of the R-R cycle. Systolic and diastolic phases were analyzed on a dedicated workstation using MPR, MIP, and VRT modes. The patient received 100 cc of contrast.   FINDINGS: Image quality: Excellent.   Noise  artifact is: Limited.   Valve Morphology: The aortic valve is tricuspid. There are severe calcifications and restricted movement in systole consistent with severe aortic stenosis. There is acquired fusion of the RCC/LCC with bulky commissural calcification.   Aortic Valve Calcium score: 6064   Aortic annular dimension:   Phase assessed: 20%   Annular area: 614 mm2   Annular perimeter: 90.4 mm   Max diameter: 31.6 mm   Min diameter: 25.6 mm   Annular and subannular calcification: Trivial annular calcium under the RCC and LCC.   Optimal coplanar projection: LAO 17 CAU 14   Coronary Artery Height above Annulus:   Left Main: 13.2 mm   Right Coronary: 18.9 mm   Sinus of Valsalva Measurements:   Non-coronary: 41 mm   Right-coronary: 43 mm   Left-coronary: 43 mm   Cusp to cusp (RCC to [facility]): 46 mm.   Sinus of Valsalva Height:   Non-coronary: 31.3 mm   Right-coronary: 29.3 mm   Left-coronary: 24.7 mm   Sinotubular Junction: 36 mm   Ascending Thoracic Aorta: 42 mm   Coronary Arteries: Normal coronary origin. Left dominance. The study was performed without use of NTG and is insufficient for plaque evaluation. Please refer to recent cardiac catheterization for coronary assessment. Coronary calcifications noted in the LAD/LCX.   Cardiac Morphology:   Right Atrium: Right atrial size is within normal limits.   Right Ventricle: The right ventricular cavity is within normal limits.   Left Atrium: Left atrial size is normal in size with no left atrial appendage filling defect.   Left Ventricle: The ventricular cavity size is within normal limits. There are no stigmata of prior infarction. There is no abnormal filling defect. Normal left ventricular function, EF=53%. No regional wall motion abnormalities.   Pulmonary arteries: Normal in size without proximal filling defect.   Pulmonary veins: Normal pulmonary venous drainage.   Pericardium: Normal thickness  with no significant effusion or calcium present.   Mitral Valve: The mitral valve is normal structure without significant calcification.   Extra-cardiac findings: See attached radiology report for non-cardiac structures.   IMPRESSION: 1. Tricuspid aortic valve with acquired fusion of the RCC/LCC. Bulky calcification noted at the commissure between the RCC/LCC.   2. Severe aortic stenosis (calcium score 6064).   3. Annular measurements appropriate for 29 mm S3 TAVR (615 mm2).   4. Trivial annular calcification.   5. Sufficient coronary to annulus distance.   6. Optimal Fluoroscopic Angle for Delivery: LAO 17 CAU 14   7. 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   [place] Bells T. Audie Box, MD     Electronically Signed   By: Eleonore Chiquito M.D.   On: 11/20/2020 12:45    Addended by Geralynn Rile, MD on 11/20/2020 12:47 PM   Study Result  Narrative & Impression  EXAM: OVER-READ INTERPRETATION  CT CHEST   The following report is an over-read performed by radiologist Dr. Vinnie Langton of [facility], [place] on 11/20/2020. This over-read  does not include interpretation of cardiac or coronary anatomy or pathology. The coronary calcium score/coronary CTA interpretation by the cardiologist is attached.   COMPARISON:  Chest CT 09/11/2013.   FINDINGS: Extracardiac findings will be described separately under dictation for contemporaneously obtained CTA chest, abdomen and pelvis.   IMPRESSION: Please see separate dictation for contemporaneously obtained CTA chest, abdomen and pelvis dated 11/20/2020 for full description of relevant extracardiac findings.   Electronically Signed: By: Vinnie Langton M.D. On: 11/20/2020 11:22    Narrative & Impression  CLINICAL DATA:  80 year old male with history of severe aortic stenosis. Preprocedural study prior to potential transcatheter aortic valve replacement (TAVR) procedure.    EXAM: CT ANGIOGRAPHY CHEST, ABDOMEN AND PELVIS   TECHNIQUE: Multidetector CT imaging through the chest, abdomen and pelvis was performed using the standard protocol during bolus administration of intravenous contrast. Multiplanar reconstructed images and MIPs were obtained and reviewed to evaluate the vascular anatomy.   CONTRAST:  59mL OMNIPAQUE IOHEXOL 350 MG/ML SOLN   COMPARISON:  CT the abdomen and pelvis 06/15/2019. Chest CT 09/11/2013.   FINDINGS: CTA CHEST FINDINGS   Cardiovascular: Heart size is mildly enlarged. There is no significant pericardial fluid, thickening or pericardial calcification. There is aortic atherosclerosis, as well as atherosclerosis of the great vessels of the mediastinum and the coronary arteries, including calcified atherosclerotic plaque in the left anterior descending and left circumflex coronary arteries. Severe thickening and calcification of the aortic valve.   Mediastinum/Lymph Nodes: No pathologically enlarged mediastinal or hilar lymph nodes. Small hiatal hernia. No axillary lymphadenopathy.   Lungs/Pleura: No acute consolidative airspace disease. No pleural effusions. No suspicious appearing pulmonary nodules or masses are noted.   Musculoskeletal/Soft Tissues: Old healed fracture of the right mid clavicle. There are no aggressive appearing lytic or blastic lesions noted in the visualized portions of the skeleton.   CTA ABDOMEN AND PELVIS FINDINGS   Hepatobiliary: Multiple low-attenuation lesions in the liver are compatible with simple cysts, largest of which measures 1.8 cm in diameter between segments 2 and 4A. Other subcentimeter low-attenuation lesions in the liver, too small to characterize, but statistically likely to represent tiny cysts and/or biliary hamartomas. Small calcified gallstones lying dependently in the gallbladder. No findings to suggest an acute cholecystitis at this time.   Pancreas: No pancreatic mass. No  pancreatic ductal dilatation. No pancreatic or peripancreatic fluid collections or inflammatory changes.   Spleen: Unremarkable.   Adrenals/Urinary Tract: Status post left radical nephrectomy. Extensive cortical thinning in the right kidney, most severe in the lower pole. Profound urothelial thickening and mass-like soft tissue enhancement in the right renal collecting system and right renal pelvis best appreciated on axial image 170 of series 7, coronal image 72 of series 9 and sagittal image 96 of series 10 where the bulkiest portion of this measures approximately 2.6 x 1.5 x 2.6 cm. 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 In the proximal aspect of what appears to be the ileal conduit there are additional areas of soft tissue thickening and enhancement (example on axial image 180 of series 7), which could represent additional tumor deposits. No hydroureteronephrosis. Right adrenal gland is normal in appearance. Left adrenal gland appears mildly thickened and irregular, without definite dominant nodule. Status post radical cystoprostatectomy.   Stomach/Bowel: The appearance of the stomach is normal. There is no pathologic dilatation of small bowel or colon. Large ventral  hernia containing multiple loops of small bowel and a portion of the cecum.   Vascular/Lymphatic: Aortic atherosclerosis, with vascular findings and measurements pertinent to potential TAVR procedure, as detailed below. 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 No lymphadenopathy noted in the abdomen or pelvis.   Reproductive: Status post radical cystoprostatectomy.   Other: No significant volume of ascites.  No pneumoperitoneum.   Musculoskeletal: There are no aggressive appearing lytic or blastic lesions noted in the visualized portions of  the skeleton.   VASCULAR MEASUREMENTS PERTINENT TO TAVR:   AORTA:   Minimal Aortic Diameter-18 x 18 mm   Severity of Aortic Calcification-mild-to-moderate   RIGHT PELVIS:   Right Common Iliac Artery -   Minimal Diameter-10.0 x 9.9 mm   Tortuosity-severe   Calcification-moderate   Right External Iliac Artery -   Minimal Diameter-11.1 x 10.7 mm   Tortuosity-severe   Calcification-mild   Right Common Femoral Artery -   Minimal Diameter-9.7 x 10.2 mm   Tortuosity-mild   Calcification-mild   LEFT PELVIS:   Left Common Iliac Artery -   Minimal Diameter-13.7 x 10.0 mm   Tortuosity-severe   Calcification-moderate   Left External Iliac Artery -   Minimal Diameter-12.2 x 9.5 mm   Tortuosity-moderate to severe   Calcification-mild   Left Common Femoral Artery -   Minimal Diameter-13.4 x 9.5 mm   Tortuosity-mild   Calcification-mild   Review of the MIP images confirms the above findings.   IMPRESSION: 1. Vascular findings and measurements pertinent to potential TAVR procedure, as detailed above. 2. Severe thickening and calcification of the aortic valve, compatible with reported clinical history of severe aortic stenosis. 3. 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 Urologic consultation is recommended for further clinical evaluation. 4. Aortic atherosclerosis, in addition to two vessel coronary artery disease. 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 5. Mild cardiomegaly. 6. Cholelithiasis without evidence of acute cholecystitis at this time. 7. Status post left radical nephrectomy. 8. Large ventral hernia containing multiple loops of small bowel and a portion of the cecum. 9. Additional incidental findings, as above.      Electronically Signed   By: Vinnie Langton M.D.   On: 11/20/2020 12:14    STS RISK CALCULATOR: Isolated AVR: Risk of Mortality: 1.706% Renal Failure: 4.919% Permanent Stroke: 0.996% Prolonged Ventilation: 6.852% DSW Infection: 0.112% Reoperation: 5.006% Morbidity or Mortality: 16.762% Short Length of Stay: 26.380% Long Length of Stay: 5.690%  Impression:  This 80 year old gentleman has stage D, severe, symptomatic aortic stenosis with New York Heart Association class II symptoms of exertional shortness of breath and chest pressure consistent with chronic diastolic congestive heart failure.  He is an active gentleman and just recently started noticing symptoms.  I have personally reviewed his 2D echocardiogram, cardiac catheterization, and CTA studies.  His last echo was in May 2022 and showed a severely calcified aortic valve with a mean gradient of 43 mmHg consistent with severe aortic stenosis.  There is mild aortic insufficiency.  Left ventricular ejection fraction was 55 to 60%.  Cardiac catheterization in September 2022 showed nonobstructive coronary disease.  I agree that aortic valve replacement is indicated in this patient for relief of his symptoms and to prevent left ventricular deterioration.  His recent abdominal CT scan also  showed a urothelial mass in the right renal pelvis that will require further urologic work-up once his severe aortic stenosis is taken care of.  Given his age and other comorbid risk factors I think that transcatheter aortic valve replacement would be the best treatment for him.  His gated cardiac CTA shows anatomy suitable for TAVR using a 29 mm Edwards SAPIEN 3 valve.  His abdominal and pelvic CTA shows adequate pelvic vascular anatomy to allow transfemoral insertion on the right side.  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  The patient was counseled at length regarding treatment alternatives for management of severe symptomatic aortic stenosis. The risks and benefits of surgical intervention has been discussed in detail. Long-term prognosis with medical therapy was discussed. Alternative approaches such as conventional surgical aortic valve replacement, transcatheter aortic valve replacement, and palliative medical therapy were compared and contrasted at length. This discussion was placed in the context of the patient's own specific clinical presentation and past medical history. All of his questions have been addressed.   Following the decision to proceed with transcatheter aortic valve replacement, a discussion was held regarding what types of management strategies would be attempted intraoperatively in the event of life-threatening complications, including whether or not the patient would be considered a candidate for the use of cardiopulmonary bypass and/or conversion to open sternotomy for attempted surgical intervention.  He is 80 years old but still in very good condition and active and I think he would be a candidate for emergent sternotomy to manage any intraoperative complications.  The patient is aware of the fact that transient use of cardiopulmonary bypass may be necessary. The patient has been advised of a variety of complications that might develop including but not limited to risks of death, stroke, paravalvular leak, aortic dissection or other major vascular complications, aortic annulus rupture, device embolization, cardiac rupture or perforation, mitral regurgitation, acute myocardial infarction, arrhythmia, heart block or bradycardia requiring permanent pacemaker placement, congestive heart failure, respiratory failure, renal failure, pneumonia, infection, other late complications related to structural valve deterioration or migration, or other complications  that might ultimately cause a temporary or permanent loss of functional independence or other long term morbidity. The patient provides full informed consent for the procedure as described and all questions were answered.      Plan:  He will be scheduled for transfemoral TAVR using a SAPIEN 3 valve on Tuesday, 01/27/2021.  I spent 60 minutes performing this consultation and > 50% of this time was spent face to face counseling and coordinating the care of this patient's severe symptomatic aortic stenosis.   Gaye Pollack, MD 01/20/2021 4:48 PM

## 2021-01-21 ENCOUNTER — Encounter: Payer: Self-pay | Admitting: Surgery

## 2021-01-21 ENCOUNTER — Encounter: Payer: Medicare PPO | Admitting: Surgery

## 2021-01-21 ENCOUNTER — Other Ambulatory Visit: Payer: Self-pay

## 2021-01-21 DIAGNOSIS — I350 Nonrheumatic aortic (valve) stenosis: Secondary | ICD-10-CM

## 2021-01-22 NOTE — Pre-Procedure Instructions (Addendum)
Surgical Instructions    Your procedure is scheduled on Tuesday, January 27, 2021 at 9:15 AM.  Report to [facility] Main Entrance "A" at 7:15 A.M., then check in with the Admitting office.  Call this number if you have problems the morning of surgery:  [phone]   If you have any questions prior to your surgery date call [phone]: Open Monday-Friday 8am-4pm    Remember:  Do not eat or drink after midnight the night before your surgery   Continue taking all medications without change through the day before surgery. On the morning of surgery do not take any medications.  As of today, STOP taking any Aspirin (unless otherwise instructed by your surgeon) Aleve, Naproxen, Ibuprofen, Motrin, Advil, Goody's, BC's, all herbal medications, fish oil, and all vitamins.                     Do NOT Smoke (Tobacco/Vaping) or drink Alcohol 24 hours prior to your procedure.  If you use a CPAP at night, you may bring all equipment for your overnight stay.   Contacts, glasses, piercing's, hearing aid's, dentures or partials may not be worn into surgery, please bring cases for these belongings.    For patients admitted to the hospital, discharge time will be determined by your treatment team.   Patients discharged the day of surgery will not be allowed to drive home, and someone needs to stay with them for 24 hours.  NO VISITORS WILL BE ALLOWED IN PRE-OP WHERE PATIENTS GET READY FOR SURGERY.  ONLY 1 SUPPORT PERSON MAY BE PRESENT IN THE WAITING ROOM WHILE YOU ARE IN SURGERY.  IF YOU ARE TO BE ADMITTED, ONCE YOU ARE IN YOUR ROOM YOU WILL BE ALLOWED TWO (2) VISITORS.  Minor children may have two parents present. Special consideration for safety and communication needs will be reviewed on a case by case basis.   Special instructions:   [facility]- Preparing For Surgery  Before surgery, you can play an important role. Because skin is not sterile, your skin needs to be as free of germs as possible.  You can reduce the number of germs on your skin by washing with CHG (chlorahexidine gluconate) Soap before surgery.  CHG is an antiseptic cleaner which kills germs and bonds with the skin to continue killing germs even after washing.    Oral Hygiene is also important to reduce your risk of infection.  Remember - BRUSH YOUR TEETH THE MORNING OF SURGERY WITH YOUR REGULAR TOOTHPASTE  Please do not use if you have an allergy to CHG or antibacterial soaps. If your skin becomes reddened/irritated stop using the CHG.  Do not shave (including legs and underarms) for at least 48 hours prior to first CHG shower. It is OK to shave your face.  Please follow these instructions carefully.   Shower the NIGHT BEFORE SURGERY and the MORNING OF SURGERY  If you chose to wash your hair, wash your hair first as usual with your normal shampoo.  After you shampoo, rinse your hair and body thoroughly to remove the shampoo.  Use CHG Soap as you would any other liquid soap. You can apply CHG directly to the skin and wash gently with a scrungie or a clean washcloth.   Apply the CHG Soap to your body ONLY FROM THE NECK DOWN.  Do not use on open wounds or open sores. Avoid contact with your eyes, ears, mouth and genitals (private parts). Wash Face and genitals (private parts)  with your normal soap.   Wash thoroughly, paying special attention to the area where your surgery will be performed.  Thoroughly rinse your body with warm water from the neck down.  DO NOT shower/wash with your normal soap after using and rinsing off the CHG Soap.  Pat yourself dry with a CLEAN TOWEL.  Wear CLEAN PAJAMAS to bed the night before surgery  Place CLEAN SHEETS on your bed the night before your surgery  DO NOT SLEEP WITH PETS.   Day of Surgery: Shower with CHG soap. Do not wear jewelry. Do not wear lotions, powders, colognes, or deodorant. Do not shave 48 hours prior to surgery.  Men may shave face and neck. Do not bring  valuables to the hospital. [facility] is not responsible for any belongings or valuables. Wear Clean/Comfortable clothing the morning of surgery Remember to brush your teeth WITH YOUR REGULAR TOOTHPASTE.   Please read over the following fact sheets that you were given.   3 days prior to your procedure or After your COVID test   You are not required to quarantine however you are required to wear a well-fitting mask when you are out and around people not in your household. If your mask becomes wet or soiled, replace with a new one.   Wash your hands often with soap and water for 20 seconds or clean your hands with an alcohol-based hand sanitizer that contains at least 60% alcohol.   Do not share personal items.   Notify your provider:  o if you are in close contact with someone who has COVID  o or if you develop a fever of 100.4 or greater, sneezing, cough, sore throat, shortness of breath or body aches.

## 2021-01-23 ENCOUNTER — Encounter (HOSPITAL_COMMUNITY): Payer: Self-pay

## 2021-01-23 ENCOUNTER — Other Ambulatory Visit: Payer: Self-pay

## 2021-01-23 ENCOUNTER — Ambulatory Visit (HOSPITAL_COMMUNITY)
Admission: RE | Admit: 2021-01-23 | Discharge: 2021-01-23 | Disposition: A | Discharge status: Home / Self Care | Payer: Medicare PPO | Source: Ambulatory Visit | Attending: Cardiovascular Disease | Admitting: Cardiovascular Disease

## 2021-01-23 ENCOUNTER — Encounter (HOSPITAL_COMMUNITY)
Admission: RE | Admit: 2021-01-23 | Discharge: 2021-01-23 | Disposition: A | Discharge status: Home / Self Care | Payer: Medicare PPO | Source: Ambulatory Visit | Attending: Cardiovascular Disease | Admitting: Cardiovascular Disease

## 2021-01-23 VITALS — BP 145/95 | HR 102 | Temp 98.7°F | Resp 18 | Ht 73.0 in | Wt 203.0 lb

## 2021-01-23 DIAGNOSIS — I350 Nonrheumatic aortic (valve) stenosis: Secondary | ICD-10-CM

## 2021-01-23 DIAGNOSIS — Z20822 Contact with and (suspected) exposure to covid-19: Secondary | ICD-10-CM | POA: Insufficient documentation

## 2021-01-23 DIAGNOSIS — Z01818 Encounter for other preprocedural examination: Secondary | ICD-10-CM

## 2021-01-23 LAB — COMPREHENSIVE METABOLIC PANEL
ALT: 13 U/L (ref 0–44)
AST: 24 U/L (ref 15–41)
Albumin: 3.8 g/dL (ref 3.5–5.0)
Alkaline Phosphatase: 42 U/L (ref 38–126)
Anion gap: 10 (ref 5–15)
BUN: 39 mg/dL — ABNORMAL HIGH (ref 8–23)
CO2: 15 mmol/L — ABNORMAL LOW (ref 22–32)
Calcium: 9 mg/dL (ref 8.9–10.3)
Chloride: 112 mmol/L — ABNORMAL HIGH (ref 98–111)
Creatinine, Ser: 2.66 mg/dL — ABNORMAL HIGH (ref 0.61–1.24)
GFR, Estimated: 23 mL/min — ABNORMAL LOW (ref >60)
Glucose, Bld: 136 mg/dL — ABNORMAL HIGH (ref 70–99)
Potassium: 4.9 mmol/L (ref 3.5–5.1)
Sodium: 137 mmol/L (ref 135–145)
Total Bilirubin: 0.8 mg/dL (ref 0.3–1.2)
Total Protein: 6.6 g/dL (ref 6.5–8.1)

## 2021-01-23 LAB — BLOOD GAS, ARTERIAL
Acid-base deficit: 4.8 mmol/L — ABNORMAL HIGH (ref 0.0–2.0)
Bicarbonate: 18.9 mmol/L — ABNORMAL LOW (ref 20.0–28.0)
FIO2: 21
O2 Saturation: 97.5 %
Patient temperature: 37
pCO2 arterial: 30.1 mmHg — ABNORMAL LOW (ref 32.0–48.0)
pH, Arterial: 7.415 (ref 7.350–7.450)
pO2, Arterial: 99.3 mmHg (ref 83.0–108.0)

## 2021-01-23 LAB — CBC
HCT: 48.6 % (ref 39.0–52.0)
Hemoglobin: 14.9 g/dL (ref 13.0–17.0)
MCH: 30 pg (ref 26.0–34.0)
MCHC: 30.7 g/dL (ref 30.0–36.0)
MCV: 97.8 fL (ref 80.0–100.0)
Platelets: 140 10*3/uL — ABNORMAL LOW (ref 150–400)
RBC: 4.97 MIL/uL (ref 4.22–5.81)
RDW: 14.1 % (ref 11.5–15.5)
WBC: 6.5 10*3/uL (ref 4.0–10.5)
nRBC: 0 % (ref 0.0–0.2)

## 2021-01-23 LAB — URINALYSIS, ROUTINE W REFLEX MICROSCOPIC
Bilirubin Urine: NEGATIVE
Glucose, UA: NEGATIVE mg/dL
Ketones, ur: NEGATIVE mg/dL
Nitrite: POSITIVE — AB
Protein, ur: 100 mg/dL — AB
Specific Gravity, Urine: 1.01 (ref 1.005–1.030)
WBC, UA: >50 WBC/hpf — ABNORMAL HIGH (ref 0–5)
pH: 8 (ref 5.0–8.0)

## 2021-01-23 LAB — SARS CORONAVIRUS 2 (TAT 6-24 HRS): SARS Coronavirus 2: NEGATIVE

## 2021-01-23 LAB — SURGICAL PCR SCREEN
MRSA, PCR: NEGATIVE
Staphylococcus aureus: NEGATIVE

## 2021-01-23 LAB — TYPE AND SCREEN
ABO/RH(D): O POS
Antibody Screen: NEGATIVE

## 2021-01-23 LAB — BRAIN NATRIURETIC PEPTIDE: B Natriuretic Peptide: 720.2 pg/mL — ABNORMAL HIGH (ref 0.0–100.0)

## 2021-01-23 LAB — PROTIME-INR
INR: 0.9 (ref 0.8–1.2)
Prothrombin Time: 12.3 seconds (ref 11.4–15.2)

## 2021-01-23 MED ORDER — CIPROFLOXACIN HCL 500 MG PO TABS: 500.0000 mg | ORAL_TABLET | Freq: Two times a day (BID) | ORAL | 0 refills | Status: DC

## 2021-01-23 NOTE — Progress Notes (Signed)
patient voiced understanding of arrival time of 0715

## 2021-01-23 NOTE — Progress Notes (Signed)
PCP - Dr. Burnard Bunting Cardiologist - Dr. Donella Stade Croitoru Oncologist: Dr. [name] Nephrologist: Dr. Gustavo Lah  PPM/ICD - Denies  Chest x-ray - 01/23/21 EKG - 01/23/21 Stress Test - 06/09/16 ECHO - 05/27/20 Cardiac Cath - 10/06/20  Sleep Study - Denies  Diabetic : Denies  Blood Thinner Instructions: N/A Aspirin Instructions: N/A  ERAS Protcol - No   COVID TEST- 01/23/21 in PAT   Anesthesia review: Yes, cardiac hx  Patient denies shortness of breath, fever, cough and chest pain at PAT appointment   All instructions explained to the patient, with a verbal understanding of the material. Patient agrees to go over the instructions while at home for a better understanding. Patient also instructed to self quarantine after being tested for COVID-19. The opportunity to ask questions was provided.

## 2021-01-26 MED ORDER — POTASSIUM CHLORIDE 2 MEQ/ML IV SOLN
80.0000 meq | INTRAVENOUS | Status: DC
  Filled 2021-01-26: qty 40

## 2021-01-26 MED ORDER — MAGNESIUM SULFATE 50 % IJ SOLN
40.0000 meq | INTRAMUSCULAR | Status: DC
  Filled 2021-01-26: qty 9.85

## 2021-01-26 MED ORDER — CEFAZOLIN SODIUM-DEXTROSE 2-4 GM/100ML-% IV SOLN
2.0000 g | INTRAVENOUS | Status: DC
  Filled 2021-01-26 (×2): qty 100

## 2021-01-26 MED ORDER — NOREPINEPHRINE 4 MG/250ML-% IV SOLN
0.0000 ug/min | INTRAVENOUS | Status: AC
  Administered 2021-01-27: 1 ug/min via INTRAVENOUS
  Filled 2021-01-26: qty 250

## 2021-01-26 MED ORDER — HEPARIN 30,000 UNITS/1000 ML (OHS) CELLSAVER SOLUTION
Status: DC
  Filled 2021-01-26: qty 1000

## 2021-01-26 MED ORDER — VANCOMYCIN HCL IN DEXTROSE 1-5 GM/200ML-% IV SOLN
1000.0000 mg | INTRAVENOUS | Status: AC
  Administered 2021-01-27: 1000 mg via INTRAVENOUS
  Filled 2021-01-26: qty 200

## 2021-01-26 MED ORDER — DEXMEDETOMIDINE HCL IN NACL 400 MCG/100ML IV SOLN
0.1000 ug/kg/h | INTRAVENOUS | Status: AC
  Administered 2021-01-27: 1 ug/kg/h via INTRAVENOUS
  Filled 2021-01-26 (×2): qty 100

## 2021-01-27 ENCOUNTER — Other Ambulatory Visit: Payer: Self-pay

## 2021-01-27 ENCOUNTER — Encounter (HOSPITAL_COMMUNITY): Payer: Self-pay | Admitting: Cardiovascular Disease

## 2021-01-27 ENCOUNTER — Inpatient Hospital Stay (HOSPITAL_COMMUNITY)
Admission: RE | Admit: 2021-01-27 | Discharge: 2021-01-29 | DRG: 266 | Disposition: A | Discharge status: Home / Self Care | Payer: Medicare PPO | Attending: Cardiovascular Disease | Admitting: Cardiovascular Disease

## 2021-01-27 ENCOUNTER — Inpatient Hospital Stay (HOSPITAL_COMMUNITY): Payer: Medicare PPO

## 2021-01-27 ENCOUNTER — Inpatient Hospital Stay (HOSPITAL_COMMUNITY): Payer: Medicare PPO | Admitting: Anesthesiology

## 2021-01-27 ENCOUNTER — Encounter (HOSPITAL_COMMUNITY)
Admission: RE | Disposition: A | Discharge status: Home / Self Care | Payer: Self-pay | Source: Home / Self Care | Attending: Cardiovascular Disease

## 2021-01-27 ENCOUNTER — Other Ambulatory Visit: Payer: Self-pay | Admitting: Physician Assistant

## 2021-01-27 DIAGNOSIS — Z905 Acquired absence of kidney: Secondary | ICD-10-CM | POA: Diagnosis not present

## 2021-01-27 DIAGNOSIS — I440 Atrioventricular block, first degree: Secondary | ICD-10-CM | POA: Diagnosis present

## 2021-01-27 DIAGNOSIS — Z8551 Personal history of malignant neoplasm of bladder: Secondary | ICD-10-CM

## 2021-01-27 DIAGNOSIS — Z800 Family history of malignant neoplasm of digestive organs: Secondary | ICD-10-CM

## 2021-01-27 DIAGNOSIS — Z85528 Personal history of other malignant neoplasm of kidney: Secondary | ICD-10-CM

## 2021-01-27 DIAGNOSIS — I130 Hypertensive heart and chronic kidney disease with heart failure and stage 1 through stage 4 chronic kidney disease, or unspecified chronic kidney disease: Secondary | ICD-10-CM | POA: Diagnosis present

## 2021-01-27 DIAGNOSIS — I2510 Atherosclerotic heart disease of native coronary artery without angina pectoris: Secondary | ICD-10-CM | POA: Diagnosis present

## 2021-01-27 DIAGNOSIS — I447 Left bundle-branch block, unspecified: Secondary | ICD-10-CM | POA: Insufficient documentation

## 2021-01-27 DIAGNOSIS — D6959 Other secondary thrombocytopenia: Secondary | ICD-10-CM | POA: Diagnosis not present

## 2021-01-27 DIAGNOSIS — Z20822 Contact with and (suspected) exposure to covid-19: Secondary | ICD-10-CM | POA: Diagnosis present

## 2021-01-27 DIAGNOSIS — Z6826 Body mass index (BMI) 26.0-26.9, adult: Secondary | ICD-10-CM

## 2021-01-27 DIAGNOSIS — I4892 Unspecified atrial flutter: Secondary | ICD-10-CM | POA: Diagnosis not present

## 2021-01-27 DIAGNOSIS — Z79899 Other long term (current) drug therapy: Secondary | ICD-10-CM

## 2021-01-27 DIAGNOSIS — E785 Hyperlipidemia, unspecified: Secondary | ICD-10-CM | POA: Diagnosis present

## 2021-01-27 DIAGNOSIS — I480 Paroxysmal atrial fibrillation: Secondary | ICD-10-CM | POA: Diagnosis not present

## 2021-01-27 DIAGNOSIS — C689 Malignant neoplasm of urinary organ, unspecified: Secondary | ICD-10-CM | POA: Diagnosis present

## 2021-01-27 DIAGNOSIS — I444 Left anterior fascicular block: Secondary | ICD-10-CM | POA: Diagnosis present

## 2021-01-27 DIAGNOSIS — D631 Anemia in chronic kidney disease: Secondary | ICD-10-CM | POA: Diagnosis not present

## 2021-01-27 DIAGNOSIS — I5032 Chronic diastolic (congestive) heart failure: Secondary | ICD-10-CM | POA: Diagnosis present

## 2021-01-27 DIAGNOSIS — Z952 Presence of prosthetic heart valve: Secondary | ICD-10-CM | POA: Diagnosis not present

## 2021-01-27 DIAGNOSIS — E43 Unspecified severe protein-calorie malnutrition: Secondary | ICD-10-CM | POA: Diagnosis present

## 2021-01-27 DIAGNOSIS — N400 Enlarged prostate without lower urinary tract symptoms: Secondary | ICD-10-CM | POA: Diagnosis present

## 2021-01-27 DIAGNOSIS — I350 Nonrheumatic aortic (valve) stenosis: Secondary | ICD-10-CM

## 2021-01-27 DIAGNOSIS — E8729 Other acidosis: Secondary | ICD-10-CM | POA: Diagnosis not present

## 2021-01-27 DIAGNOSIS — Z006 Encounter for examination for normal comparison and control in clinical research program: Secondary | ICD-10-CM

## 2021-01-27 DIAGNOSIS — I10 Essential (primary) hypertension: Secondary | ICD-10-CM | POA: Diagnosis present

## 2021-01-27 DIAGNOSIS — N184 Chronic kidney disease, stage 4 (severe): Secondary | ICD-10-CM | POA: Diagnosis present

## 2021-01-27 DIAGNOSIS — E878 Other disorders of electrolyte and fluid balance, not elsewhere classified: Secondary | ICD-10-CM | POA: Diagnosis present

## 2021-01-27 DIAGNOSIS — C679 Malignant neoplasm of bladder, unspecified: Secondary | ICD-10-CM | POA: Diagnosis present

## 2021-01-27 DIAGNOSIS — Z880 Allergy status to penicillin: Secondary | ICD-10-CM

## 2021-01-27 DIAGNOSIS — K219 Gastro-esophageal reflux disease without esophagitis: Secondary | ICD-10-CM | POA: Diagnosis present

## 2021-01-27 DIAGNOSIS — I129 Hypertensive chronic kidney disease with stage 1 through stage 4 chronic kidney disease, or unspecified chronic kidney disease: Secondary | ICD-10-CM | POA: Diagnosis not present

## 2021-01-27 HISTORY — PX: TRANSCATHETER AORTIC VALVE REPLACEMENT, TRANSFEMORAL: SHX6400

## 2021-01-27 HISTORY — DX: Chronic kidney disease, stage 4 (severe): N18.4

## 2021-01-27 HISTORY — DX: Paroxysmal atrial fibrillation: I48.0

## 2021-01-27 HISTORY — PX: INTRAOPERATIVE TRANSTHORACIC ECHOCARDIOGRAM: SHX6523

## 2021-01-27 HISTORY — DX: Presence of prosthetic heart valve: Z95.2

## 2021-01-27 LAB — POCT I-STAT, CHEM 8
BUN: 45 mg/dL — ABNORMAL HIGH (ref 8–23)
BUN: 43 mg/dL — ABNORMAL HIGH (ref 8–23)
Calcium, Ion: 1.31 mmol/L (ref 1.15–1.40)
Calcium, Ion: 1.3 mmol/L (ref 1.15–1.40)
Chloride: 110 mmol/L (ref 98–111)
Chloride: 111 mmol/L (ref 98–111)
Creatinine, Ser: 2.3 mg/dL — ABNORMAL HIGH (ref 0.61–1.24)
Creatinine, Ser: 2.4 mg/dL — ABNORMAL HIGH (ref 0.61–1.24)
Glucose, Bld: 151 mg/dL — ABNORMAL HIGH (ref 70–99)
Glucose, Bld: 128 mg/dL — ABNORMAL HIGH (ref 70–99)
HCT: 39 % (ref 39.0–52.0)
HCT: 35 % — ABNORMAL LOW (ref 39.0–52.0)
Hemoglobin: 13.3 g/dL (ref 13.0–17.0)
Hemoglobin: 11.9 g/dL — ABNORMAL LOW (ref 13.0–17.0)
Potassium: 4.2 mmol/L (ref 3.5–5.1)
Potassium: 4.3 mmol/L (ref 3.5–5.1)
Sodium: 142 mmol/L (ref 135–145)
Sodium: 142 mmol/L (ref 135–145)
TCO2: 20 mmol/L — ABNORMAL LOW (ref 22–32)
TCO2: 19 mmol/L — ABNORMAL LOW (ref 22–32)

## 2021-01-27 LAB — ECHOCARDIOGRAM LIMITED
AR max vel: 2.16 cm2
AV Area VTI: 2.16 cm2
AV Area mean vel: 2.13 cm2
AV Mean grad: 3 mmHg
AV Peak grad: 5.8 mmHg
Ao pk vel: 1.2 m/s

## 2021-01-27 LAB — POCT ACTIVATED CLOTTING TIME
Activated Clotting Time: 138 seconds
Activated Clotting Time: 126 seconds
Activated Clotting Time: 272 seconds

## 2021-01-27 SURGERY — IMPLANTATION, AORTIC VALVE, TRANSCATHETER, FEMORAL APPROACH
Anesthesia: Monitor Anesthesia Care | Site: Chest | Laterality: Right

## 2021-01-27 MED ORDER — HEPARIN SODIUM (PORCINE) 1000 UNIT/ML IJ SOLN
INTRAMUSCULAR | Status: DC | PRN
  Administered 2021-01-27: 14000 [IU] via INTRAVENOUS

## 2021-01-27 MED ORDER — ASPIRIN 81 MG PO CHEW
81.0000 mg | CHEWABLE_TABLET | Freq: Every day | ORAL | Status: DC
  Administered 2021-01-28 – 2021-01-29 (×2): 81 mg via ORAL
  Filled 2021-01-27 (×2): qty 1

## 2021-01-27 MED ORDER — CHLORHEXIDINE GLUCONATE 0.12 % MT SOLN
OROMUCOSAL | Status: AC
  Filled 2021-01-27: qty 15

## 2021-01-27 MED ORDER — IOHEXOL 350 MG/ML SOLN
INTRAVENOUS | Status: DC | PRN
  Administered 2021-01-27: 30 mL

## 2021-01-27 MED ORDER — SODIUM CHLORIDE 0.9 % IV SOLN: 250.0000 mL | INTRAVENOUS | Status: DC

## 2021-01-27 MED ORDER — MORPHINE SULFATE (PF) 2 MG/ML IV SOLN: 1.0000 mg | INTRAVENOUS | Status: DC | PRN

## 2021-01-27 MED ORDER — HEPARIN (PORCINE) IN NACL 1000-0.9 UT/500ML-% IV SOLN
INTRAVENOUS | Status: AC
  Filled 2021-01-27: qty 500

## 2021-01-27 MED ORDER — CHLORHEXIDINE GLUCONATE 4 % EX LIQD: 60.0000 mL | Freq: Once | CUTANEOUS | Status: DC

## 2021-01-27 MED ORDER — LACTATED RINGERS IV SOLN: INTRAVENOUS | Status: DC

## 2021-01-27 MED ORDER — SODIUM CHLORIDE 0.9% FLUSH: 3.0000 mL | INTRAVENOUS | Status: DC | PRN

## 2021-01-27 MED ORDER — VANCOMYCIN HCL IN DEXTROSE 1-5 GM/200ML-% IV SOLN
1000.0000 mg | Freq: Once | INTRAVENOUS | Status: AC
  Administered 2021-01-28: 1000 mg via INTRAVENOUS
  Filled 2021-01-27: qty 200

## 2021-01-27 MED ORDER — NITROGLYCERIN IN D5W 200-5 MCG/ML-% IV SOLN: 0.0000 ug/min | INTRAVENOUS | Status: DC

## 2021-01-27 MED ORDER — OXYCODONE HCL 5 MG PO TABS: 5.0000 mg | ORAL_TABLET | ORAL | Status: DC | PRN

## 2021-01-27 MED ORDER — ACETAMINOPHEN 500 MG PO TABS: 1000.0000 mg | ORAL_TABLET | Freq: Once | ORAL | Status: DC

## 2021-01-27 MED ORDER — ACETAMINOPHEN 650 MG RE SUPP: 650.0000 mg | Freq: Four times a day (QID) | RECTAL | Status: DC | PRN

## 2021-01-27 MED ORDER — PHENYLEPHRINE HCL-NACL 20-0.9 MG/250ML-% IV SOLN: 0.0000 ug/min | INTRAVENOUS | Status: DC

## 2021-01-27 MED ORDER — ONDANSETRON HCL 4 MG/2ML IJ SOLN: 4.0000 mg | Freq: Four times a day (QID) | INTRAMUSCULAR | Status: DC | PRN

## 2021-01-27 MED ORDER — PROTAMINE SULFATE 10 MG/ML IV SOLN
INTRAVENOUS | Status: DC | PRN
  Administered 2021-01-27: 10 mg via INTRAVENOUS
  Administered 2021-01-27: 50 mg via INTRAVENOUS
  Administered 2021-01-27: 30 mg via INTRAVENOUS
  Administered 2021-01-27: 50 mg via INTRAVENOUS

## 2021-01-27 MED ORDER — FLECAINIDE ACETATE 50 MG PO TABS
25.0000 mg | ORAL_TABLET | Freq: Two times a day (BID) | ORAL | Status: DC
  Administered 2021-01-27 – 2021-01-29 (×5): 25 mg via ORAL
  Filled 2021-01-27 (×6): qty 1

## 2021-01-27 MED ORDER — SODIUM BICARBONATE 650 MG PO TABS
1300.0000 mg | ORAL_TABLET | Freq: Two times a day (BID) | ORAL | Status: DC
  Administered 2021-01-27 – 2021-01-29 (×4): 1300 mg via ORAL
  Filled 2021-01-27 (×4): qty 2

## 2021-01-27 MED ORDER — SODIUM CHLORIDE 0.9 % IV SOLN: INTRAVENOUS | Status: DC

## 2021-01-27 MED ORDER — SODIUM CHLORIDE 0.9 % IV SOLN: INTRAVENOUS | Status: AC

## 2021-01-27 MED ORDER — TRAMADOL HCL 50 MG PO TABS: 50.0000 mg | ORAL_TABLET | ORAL | Status: DC | PRN

## 2021-01-27 MED ORDER — CHLORHEXIDINE GLUCONATE 0.12 % MT SOLN
15.0000 mL | Freq: Once | OROMUCOSAL | Status: AC
  Administered 2021-01-27: 15 mL via OROMUCOSAL

## 2021-01-27 MED ORDER — DILTIAZEM HCL ER COATED BEADS 180 MG PO CP24
180.0000 mg | ORAL_CAPSULE | Freq: Every day | ORAL | Status: DC
  Administered 2021-01-27 – 2021-01-29 (×3): 180 mg via ORAL
  Filled 2021-01-27 (×3): qty 1

## 2021-01-27 MED ORDER — ACETAMINOPHEN 325 MG PO TABS: 650.0000 mg | ORAL_TABLET | Freq: Four times a day (QID) | ORAL | Status: DC | PRN

## 2021-01-27 MED ORDER — ESCITALOPRAM OXALATE 10 MG PO TABS
10.0000 mg | ORAL_TABLET | Freq: Every morning | ORAL | Status: DC
  Administered 2021-01-27 – 2021-01-29 (×3): 10 mg via ORAL
  Filled 2021-01-27 (×3): qty 1

## 2021-01-27 MED ORDER — LIDOCAINE HCL (PF) 1 % IJ SOLN
INTRAMUSCULAR | Status: AC
  Filled 2021-01-27: qty 30

## 2021-01-27 MED ORDER — ALPRAZOLAM 0.25 MG PO TABS
0.2500 mg | ORAL_TABLET | Freq: Every day | ORAL | Status: DC
  Administered 2021-01-27 – 2021-01-28 (×2): 0.25 mg via ORAL
  Filled 2021-01-27 (×2): qty 1

## 2021-01-27 MED ORDER — MIRTAZAPINE 15 MG PO TABS
7.5000 mg | ORAL_TABLET | Freq: Every day | ORAL | Status: DC
  Administered 2021-01-27 – 2021-01-28 (×2): 7.5 mg via ORAL
  Filled 2021-01-27 (×2): qty 1

## 2021-01-27 MED ORDER — PANTOPRAZOLE SODIUM 40 MG PO TBEC
40.0000 mg | DELAYED_RELEASE_TABLET | Freq: Every day | ORAL | Status: DC
  Administered 2021-01-27 – 2021-01-29 (×3): 40 mg via ORAL
  Filled 2021-01-27 (×3): qty 1

## 2021-01-27 MED ORDER — SODIUM CHLORIDE 0.9% FLUSH
3.0000 mL | Freq: Two times a day (BID) | INTRAVENOUS | Status: DC
  Administered 2021-01-28 – 2021-01-29 (×3): 3 mL via INTRAVENOUS

## 2021-01-27 MED ORDER — HEPARIN (PORCINE) IN NACL 1000-0.9 UT/500ML-% IV SOLN
INTRAVENOUS | Status: DC | PRN
  Administered 2021-01-27 (×2): 500 mL

## 2021-01-27 MED ORDER — CHLORHEXIDINE GLUCONATE 4 % EX LIQD: 30.0000 mL | CUTANEOUS | Status: DC

## 2021-01-27 MED ORDER — PROPOFOL 500 MG/50ML IV EMUL: INTRAVENOUS | Status: DC | PRN

## 2021-01-27 MED ORDER — HYDRALAZINE HCL 50 MG PO TABS
50.0000 mg | ORAL_TABLET | Freq: Two times a day (BID) | ORAL | Status: DC
  Administered 2021-01-27 – 2021-01-29 (×5): 50 mg via ORAL
  Filled 2021-01-27 (×5): qty 1

## 2021-01-27 MED ORDER — SODIUM CHLORIDE 0.9 % IV SOLN: 250.0000 mL | INTRAVENOUS | Status: DC | PRN

## 2021-01-27 MED ORDER — PROPOFOL 500 MG/50ML IV EMUL
INTRAVENOUS | Status: DC | PRN
Start: 2021-01-27 — End: 2021-01-27
  Administered 2021-01-27: 25 ug/kg/min via INTRAVENOUS

## 2021-01-27 MED ORDER — FENTANYL CITRATE (PF) 250 MCG/5ML IJ SOLN
INTRAMUSCULAR | Status: DC | PRN
  Administered 2021-01-27: 50 ug via INTRAVENOUS
  Administered 2021-01-27 (×2): 25 ug via INTRAVENOUS

## 2021-01-27 SURGICAL SUPPLY — 33 items
BAG SNAP BAND KOVER 36X36 (MISCELLANEOUS) ×6 IMPLANT
BLANKET WARM UNDERBOD FULL ACC (MISCELLANEOUS) ×3 IMPLANT
CABLE ADAPT PACING TEMP 12FT (ADAPTER) ×1 IMPLANT
CATH COMMANDER DELIVERY SYS 29 (CATHETERS) ×1 IMPLANT
CATH DIAG 6FR PIGTAIL ANGLED (CATHETERS) ×2 IMPLANT
CATH INFINITI 6F AL2 (CATHETERS) ×1 IMPLANT
CATH S G BIP PACING (CATHETERS) ×1 IMPLANT
CLOSURE MYNX CONTROL 6F/7F (Vascular Products) ×1 IMPLANT
CLOSURE PERCLOSE PROSTYLE (VASCULAR PRODUCTS) ×2 IMPLANT
CRIMPER (MISCELLANEOUS) ×1 IMPLANT
DEVICE INFLATION ATRION QL38 (MISCELLANEOUS) ×1 IMPLANT
GUIDEWIRE SAF TJ AMPL .035X180 (WIRE) ×1 IMPLANT
GUIDEWIRE SAFE TJ AMPLATZ EXST (WIRE) ×1 IMPLANT
KIT HEART LEFT (KITS) ×3 IMPLANT
KIT MICROPUNCTURE NIT STIFF (SHEATH) ×1 IMPLANT
KIT SAPIAN 3 ULTRA RESILIA 29 (Valve) ×1 IMPLANT
PACK CARDIAC CATHETERIZATION (CUSTOM PROCEDURE TRAY) ×3 IMPLANT
SHEATH BRITE TIP 7FR 35CM (SHEATH) ×1 IMPLANT
SHEATH INTRODUCER SET 29 (SHEATH) ×1 IMPLANT
SHEATH PINNACLE 6F 10CM (SHEATH) ×1 IMPLANT
SHEATH PINNACLE 8F 10CM (SHEATH) ×1 IMPLANT
SHEATH PROBE COVER 6X72 (BAG) ×1 IMPLANT
SHIELD RADPAD SCOOP 12X17 (MISCELLANEOUS) ×1 IMPLANT
SLEEVE REPOSITIONING LENGTH 30 (MISCELLANEOUS) ×1 IMPLANT
STOPCOCK MORSE 400PSI 3WAY (MISCELLANEOUS) ×6 IMPLANT
SYR MEDRAD MARK V 150ML (SYRINGE) ×1 IMPLANT
TRANSDUCER W/STOPCOCK (MISCELLANEOUS) ×6 IMPLANT
TUBING CONTRAST HIGH PRESS 48 (TUBING) ×1 IMPLANT
WIRE AMPLATZ SS-J .035X180CM (WIRE) ×1 IMPLANT
WIRE EMERALD 3MM-J .035X150CM (WIRE) ×1 IMPLANT
WIRE EMERALD 3MM-J .035X260CM (WIRE) ×1 IMPLANT
WIRE EMERALD ST .035X260CM (WIRE) ×1 IMPLANT
WIRE MICRO SET SILHO 5FR 7 (SHEATH) ×1 IMPLANT

## 2021-01-27 NOTE — Op Note (Signed)
[facility]   MULTIDISCIPLINARY HEART VALVE TEAM   TAVR OPERATIVE NOTE   Date of Procedure:  01/27/2021  Preoperative Diagnosis: Severe Aortic Stenosis   Postoperative Diagnosis: Same   Procedure:   Transcatheter Aortic Valve Replacement - Percutaneous Right Transfemoral Approach  Edwards Sapien 3 Ultra Resilia THV (size 29 mm, model # 9755RSL, serial # [id number])   Co-Surgeons:  Gaye Pollack, MD and Sherren Mocha, MD     Anesthesiologist:  Jenita Seashore, MD  Echocardiographer:  P. Johnsie Cancel, MD  Pre-operative Echo Findings: Severe aortic stenosis Normal left ventricular systolic function  Post-operative Echo Findings: No paravalvular leak Normal left ventricular systolic function   BRIEF CLINICAL NOTE AND INDICATIONS FOR SURGERY  This 81 year old gentleman has stage D, severe, symptomatic aortic stenosis with New York Heart Association class II symptoms of exertional shortness of breath and chest pressure consistent with chronic diastolic congestive heart failure.  He is an active gentleman and just recently started noticing symptoms.  I have personally reviewed his 2D echocardiogram, cardiac catheterization, and CTA studies.  His last echo was in May 2022 and showed a severely calcified aortic valve with a mean gradient of 43 mmHg consistent with severe aortic stenosis.  There is mild aortic insufficiency.  Left ventricular ejection fraction was 55 to 60%.  Cardiac catheterization in September 2022 showed nonobstructive coronary disease.  I agree that aortic valve replacement is indicated in this patient for relief of his symptoms and to prevent left ventricular deterioration.  His recent abdominal CT scan also showed a urothelial mass in the right renal pelvis that will require further urologic work-up once his severe aortic stenosis is taken care of.  Given his age and other comorbid risk factors I think that transcatheter aortic valve replacement would be the best  treatment for him.  His gated cardiac CTA shows anatomy suitable for TAVR using a 29 mm Edwards SAPIEN 3 valve.  His abdominal and pelvic CTA shows adequate pelvic vascular anatomy to allow transfemoral insertion on the right side.  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   The patient was counseled at length regarding treatment alternatives for management of severe symptomatic aortic stenosis. The risks and benefits of surgical intervention has been discussed in detail. Long-term prognosis with medical therapy was discussed. Alternative approaches such as conventional surgical aortic valve replacement, transcatheter aortic valve replacement, and palliative medical therapy were compared and contrasted at length. This discussion was placed in the context of the patient's own specific clinical presentation and past medical history. All of his questions have been addressed.    Following the decision to proceed with transcatheter aortic valve replacement, a discussion was held regarding what types of management strategies would be attempted intraoperatively in the event of life-threatening complications, including whether or not the patient would be considered a candidate for the use of cardiopulmonary bypass and/or conversion to open sternotomy for attempted surgical intervention.  He is 81 years old but still in very good condition and active and I think he would be a candidate for emergent sternotomy to manage any intraoperative complications.  The patient is aware of the fact that transient use of cardiopulmonary bypass may be necessary. The patient has been advised of a variety of complications that might develop including but not limited to risks of death, stroke, paravalvular leak, aortic dissection or other major  vascular complications,  aortic annulus rupture, device embolization, cardiac rupture or perforation, mitral regurgitation, acute myocardial infarction, arrhythmia, heart block or bradycardia requiring permanent pacemaker placement, congestive heart failure, respiratory failure, renal failure, pneumonia, infection, other late complications related to structural valve deterioration or migration, or other complications that might ultimately cause a temporary or permanent loss of functional independence or other long term morbidity. The patient provides full informed consent for the procedure as described and all questions were answered.     DETAILS OF THE OPERATIVE PROCEDURE  PREPARATION:    The patient was brought to the operating room on the above mentioned date and appropriate monitoring was established by the anesthesia team. The patient was placed in the supine position on the operating table.  Intravenous antibiotics were administered. The patient was monitored closely throughout the procedure under conscious sedation.   Baseline transthoracic echocardiogram was performed. The patient's abdomen and both groins were prepped and draped in a sterile manner. A time out procedure was performed.   PERIPHERAL ACCESS:    Using the modified Seldinger technique, femoral arterial and venous access was obtained with placement of 6 Fr sheaths on the left side.  A pigtail diagnostic catheter was passed through the left arterial sheath under fluoroscopic guidance into the aortic root.  A temporary transvenous pacemaker catheter was passed through the left femoral venous sheath under fluoroscopic guidance into the right ventricle.  The pacemaker was tested to ensure stable lead placement and pacemaker capture. Aortic root angiography was performed in order to determine the optimal angiographic angle for valve deployment.   TRANSFEMORAL ACCESS:   Percutaneous transfemoral access and sheath placement was performed using ultrasound  guidance.  The right common femoral artery was cannulated using a micropuncture needle and appropriate location was verified using hand injection angiogram.  A pair of Abbott Perclose percutaneous closure devices were placed and a 6 French sheath replaced into the femoral artery.  The patient was heparinized systemically and ACT verified > 250 seconds.    A 16 Fr transfemoral E-sheath was introduced into the right common femoral artery after progressively dilating over an Amplatz superstiff wire. An AL-2 catheter was used to direct a straight-tip exchange length wire across the native aortic valve into the left ventricle. This was exchanged out for a pigtail catheter and position was confirmed in the LV apex. Simultaneous LV and Ao pressures were recorded.  The pigtail catheter was exchanged for an Amplatz Extra-stiff wire in the LV apex.    BALLOON AORTIC VALVULOPLASTY:   Not performed   TRANSCATHETER HEART VALVE DEPLOYMENT:   An Edwards Sapien 3 Ultra transcatheter heart valve (size 29 mm) was prepared and crimped per manufacturer's guidelines, and the proper orientation of the valve is confirmed on the [employer] delivery system. The valve was advanced through the introducer sheath using normal technique until in an appropriate position in the abdominal aorta beyond the sheath tip. The balloon was then retracted and using the fine-tuning wheel was centered on the valve. The valve was then advanced across the aortic arch using appropriate flexion of the catheter. The valve was carefully positioned across the aortic valve annulus. The Commander catheter was retracted using normal technique. Once final position of the valve has been confirmed by angiographic assessment, the valve is deployed while temporarily holding ventilation and during rapid ventricular pacing to maintain systolic blood pressure < 50 mmHg and pulse pressure < 10 mmHg. The balloon inflation is held for >3 seconds after reaching  full deployment  volume. Once the balloon has fully deflated the balloon is retracted into the ascending aorta and valve function is assessed using echocardiography. There is felt to be no paravalvular leak and no central aortic insufficiency.  The patient's hemodynamic recovery following valve deployment is good.  The deployment balloon and guidewire are both removed.    PROCEDURE COMPLETION:   The sheath was removed and femoral artery closure performed.  Protamine was administered once femoral arterial repair was complete. The temporary pacemaker, pigtail catheters and femoral sheaths were removed with manual pressure used for hemostasis.  A Mynx femoral closure device was utilized following removal of the diagnostic sheath in the left femoral artery.  The patient tolerated the procedure well and is transported to the cath lab recovery area in stable condition. There were no immediate intraoperative complications. All sponge instrument and needle counts are verified correct at completion of the operation.   No blood products were administered during the operation.  The patient received a total of 30 mL of diluted intravenous contrast during the procedure.   Gaye Pollack, MD 01/27/2021

## 2021-01-27 NOTE — Anesthesia Procedure Notes (Signed)
Procedure Name: MAC Date/Time: 01/27/2021 9:54 AM Performed by: Mariea Clonts, CRNA Pre-anesthesia Checklist: Patient identified, Emergency Drugs available, Suction available, Patient being monitored and Timeout performed Oxygen Delivery Method: Simple face mask

## 2021-01-27 NOTE — Anesthesia Postprocedure Evaluation (Signed)
Anesthesia Post Note  Patient: [name]  Procedure(s) Performed: TRANSCATHETER AORTIC VALVE REPLACEMENT, TRANSFEMORAL (Right: Chest) INTRAOPERATIVE TRANSTHORACIC ECHOCARDIOGRAM     Patient location during evaluation: Nursing Unit Anesthesia Type: MAC Level of consciousness: awake and alert, patient cooperative and oriented Pain management: pain level controlled Vital Signs Assessment: post-procedure vital signs reviewed and stable Respiratory status: nonlabored ventilation, spontaneous breathing, respiratory function stable and patient connected to nasal cannula oxygen Cardiovascular status: blood pressure returned to baseline and stable Postop Assessment: no apparent nausea or vomiting and adequate PO intake Anesthetic complications: no   There were no known notable events for this encounter.  Last Vitals:  Vitals:   01/27/21 1250 01/27/21 1307  BP: 126/68 134/71  Pulse: (!) 50 (!) 51  Resp: 19 17  Temp:  36.6 C  SpO2: 95% 94%    Last Pain:  Vitals:   01/27/21 1307  TempSrc: Axillary  PainSc:                  Kaden Dunkel,E. Kimisha Eunice

## 2021-01-27 NOTE — Anesthesia Preprocedure Evaluation (Addendum)
Anesthesia Evaluation  Patient identified by MRN, date of birth, ID band Patient awake    Reviewed: Allergy & Precautions, NPO status , Patient's Chart, lab work & pertinent test results  History of Anesthesia Complications Negative for: history of anesthetic complications  Airway Mallampati: III  TM Distance: >3 FB Neck ROM: Full    Dental  (+) Chipped, Dental Advisory Given   Pulmonary neg pulmonary ROS,    breath sounds clear to auscultation       Cardiovascular hypertension,  Rhythm:Regular Rate:Normal + Systolic murmurs 03/938 Cath:   Ramus lesion is 60% stenosed.   1st Diag lesion is 90% stenosed.   2nd Diag lesion is 50% stenosed.   Prox LAD to Mid LAD lesion is 30% stenosed.   Mid Cx lesion is 30% stenosed.  05/2020 ECHO: 1. The aortic valve is calcified. Aortic valve regurgitation is mild. Severe AS. Aortic valve area, by VTI measures 0.92 cm, mean gradient measures 43.5 mmHg. Aortic valve Vmax measures 4.21 m/s.  EF 55-60%. LV has normal function, no regional wall motion abnormalities. Moderate concentric LVH. Grade I DD, Right ventricular systolic function is normal, mild TR   Neuro/Psych negative neurological ROS     GI/Hepatic Neg liver ROS, GERD  Controlled,  Endo/Other  negative endocrine ROS  Renal/GU Renal InsufficiencyRenal diseaseS/p nephrectomy for renal Ca   S/p bladder cancer    Musculoskeletal   Abdominal   Peds  Hematology negative hematology ROS (+)   Anesthesia Other Findings   Reproductive/Obstetrics                           Anesthesia Physical Anesthesia Plan  ASA: 3  Anesthesia Plan: MAC   Post-op Pain Management: Tylenol PO (pre-op)   Induction:   PONV Risk Score and Plan: 1 and Treatment may vary due to age or medical condition  Airway Management Planned: Natural Airway and Simple Face Mask  Additional Equipment: Arterial line  Intra-op  Plan:   Post-operative Plan:   Informed Consent: I have reviewed the patients History and Physical, chart, labs and discussed the procedure including the risks, benefits and alternatives for the proposed anesthesia with the patient or authorized representative who has indicated his/her understanding and acceptance.     Dental advisory given  Plan Discussed with: CRNA and Surgeon  Anesthesia Plan Comments:        Anesthesia Quick Evaluation

## 2021-01-27 NOTE — Discharge Instructions (Signed)

## 2021-01-27 NOTE — H&P (Signed)
[address]       [place] [address]             [phone]      Cardiothoracic Surgery Admission History and Physical   PCP is Burnard Bunting, MD Referring Provider is Dr. Sherren Mocha Primary Cardiologist is Sanda Klein, MD   Reason for admission:  Severe aortic stenosis   HPI:   The patient is an 81 year old gentleman with a history of hypertension, hyperlipidemia, urothelial cancer status post cystectomy and bilateral ureterectomy in 2015 with ileal conduit followed by left nephrectomy in 2018, stage IV chronic kidney disease with 1 remaining kidney, paroxysmal atrial fibrillation not on anticoagulation due to recurrent hematuria, and severe aortic stenosis that has been followed for several years.  He is a very active 81 year old and walks at least 3 miles 6 days/week.  He was asymptomatic until recently but now notes some exertional upper chest tightness and shortness of breath over the past couple months.  He denies any fatigue.  He denies orthopnea.  Has had no peripheral edema.  He denies any dizziness or syncope.   He is divorced and here today with his friend [name].     Past Medical History:  Diagnosis Date   A-fib [facility])      with ventricular response   Bladder cancer ([place])     BPH (benign prostatic hyperplasia)     Colon polyps     Diverticulosis     GERD (gastroesophageal reflux disease)     Hyperlipidemia      pt denies   Hypertension     Renal cell carcinoma ([place]) 2008    left   Severe aortic stenosis 10/05/2020           Past Surgical History:  Procedure Laterality Date   APPENDECTOMY       BLADDER SURGERY   2008    transurethral resection/resection of prostatic urethra   BOWEL RESECTION       ESOPHAGOGASTRODUODENOSCOPY N/A 07/24/2013    Procedure: ESOPHAGOGASTRODUODENOSCOPY (EGD);  Surgeon: [name], MD;  Location: [name];  Service: Endoscopy;  Laterality: N/A;   laparoscopic surgery   2012    laser   (?)    NEPHRECTOMY   2007    partial, left   NEPHROSTOMY   2011    stent   NM MYOCAR PERF WALL MOTION   09/08/2010    Normal   RIGHT HEART CATH AND CORONARY ANGIOGRAPHY N/A 10/06/2020    Procedure: RIGHT HEART CATH AND CORONARY ANGIOGRAPHY;  Surgeon: Sherren Mocha, MD;  Location: [facility];  Service: Cardiovascular;  Laterality: N/A;   ROBOT ASSISTED LAPAROSCOPIC COMPLETE CYSTECT ILEAL CONDUIT       US ECHOCARDIOGRAPHY   7/61/9509    mild diastolic dysfunction,mild dilated LA,mild MR,AI,mildly dilated aortic root           Family History  Problem Relation Age of Onset   Ovarian cancer Mother     Colon cancer Mother     Colon cancer Father     Crohn's disease Son     Breast cancer Daughter     Colon cancer Brother        Social History         Socioeconomic History   Marital status: Divorced      Spouse name: Not on file   Number of children: 2   Years of education: Not on file   Highest education level: Not  on file  Occupational History   Occupation: Pharmacist, hospital  Tobacco Use   Smoking status: Never   Smokeless tobacco: Never  Substance and Sexual Activity   Alcohol use: Yes      Comment: occasionally   Drug use: No   Sexual activity: Not on file  Other Topics Concern   Not on file  Social History Narrative   Not on file    Social Determinants of Health    Financial Resource Strain: Not on file  Food Insecurity: Not on file  Transportation Needs: Not on file  Physical Activity: Not on file  Stress: Not on file  Social Connections: Not on file  Intimate Partner Violence: Not on file             Prior to Admission medications   Medication Sig Start Date End Date Taking? Authorizing Provider  ALPRAZolam (XANAX) 0.25 MG tablet Take 0.25 mg by mouth at bedtime.     Yes [provider]  diltiazem (CARDIZEM CD) 180 MG 24 hr capsule Take 180 mg by mouth daily. 09/29/20   Yes [provider]  escitalopram (LEXAPRO) 10 MG tablet Take 10 mg by mouth  every morning. 11/08/12   Yes [provider]  flecainide (TAMBOCOR) 50 MG tablet Take 1/2 (one-half) tablet by mouth twice daily 11/17/20   Yes Croitoru, Mihai, MD  hydrALAZINE (APRESOLINE) 50 MG tablet Take 50 mg by mouth 2 (two) times daily.     Yes [provider]  latanoprost (XALATAN) 0.005 % ophthalmic solution INSTILL 1 DROP INTO EACH EYE AT BEDTIME 09/08/20   Yes [provider]  mirtazapine (REMERON) 15 MG tablet Take 7.5 mg by mouth at bedtime. 08/09/20   Yes [provider]  omeprazole (PRILOSEC) 20 MG capsule Take 2 capsules (40 mg total) by mouth daily. Patient taking differently: Take 20 mg by mouth daily. 07/24/13   Yes Leanna Battles, MD  sodium bicarbonate 650 MG tablet Take 1,300 mg by mouth 2 (two) times daily. 09/01/20   Yes [provider]            Current Outpatient Medications  Medication Sig Dispense Refill   ALPRAZolam (XANAX) 0.25 MG tablet Take 0.25 mg by mouth at bedtime.       diltiazem (CARDIZEM CD) 180 MG 24 hr capsule Take 180 mg by mouth daily.       escitalopram (LEXAPRO) 10 MG tablet Take 10 mg by mouth every morning.       flecainide (TAMBOCOR) 50 MG tablet Take 1/2 (one-half) tablet by mouth twice daily 90 tablet 0   hydrALAZINE (APRESOLINE) 50 MG tablet Take 50 mg by mouth 2 (two) times daily.       latanoprost (XALATAN) 0.005 % ophthalmic solution INSTILL 1 DROP INTO EACH EYE AT BEDTIME       mirtazapine (REMERON) 15 MG tablet Take 7.5 mg by mouth at bedtime.       omeprazole (PRILOSEC) 20 MG capsule Take 2 capsules (40 mg total) by mouth daily. (Patient taking differently: Take 20 mg by mouth daily.) 60 capsule 12   sodium bicarbonate 650 MG tablet Take 1,300 mg by mouth 2 (two) times daily.        No current facility-administered medications for this visit.           Allergies  Allergen Reactions   Amoxicillin Other (See Comments)      Acute interstitial nephritis   Doxycycline Hives   Flomax  [Tamsulosin Hcl] Other (See  Comments)      Dizzy            Review of Systems:               General:                      normal appetite, normal energy, no weight gain, no weight loss, no fever             Cardiac:                       + chest pain with exertion, no chest pain at rest, +SOB with moderate exertion, no resting SOB, no PND, no orthopnea, no palpitations, no arrhythmia, + atrial fibrillation, no LE edema, no dizzy spells, no syncope             Respiratory:                 + exertional shortness of breath, no home oxygen, no productive cough, no dry cough, no bronchitis, no wheezing, no hemoptysis, no asthma, no pain with inspiration or cough, no sleep apnea, no CPAP at night             GI:                               no difficulty swallowing, no reflux, no frequent heartburn, no hiatal hernia, no abdominal pain, no constipation, no diarrhea, no hematochezia, no hematemesis, no melena             GU:                              no dysuria,  no frequency, no urinary tract infection, + hematuria, no enlarged prostate, no kidney stones, + kidney disease             Vascular:                     no pain suggestive of claudication, no pain in feet, no leg cramps, no varicose veins, no DVT, no non-healing foot ulcer             Neuro:                         no stroke, no TIA's, no seizures, no headaches, no temporary blindness one eye,  no slurred speech, no peripheral neuropathy, no chronic pain, no instability of gait, no memory/cognitive dysfunction             Musculoskeletal:         no arthritis, no joint swelling, no myalgias, no difficulty walking, normal mobility              Skin:                            no rash, no itching, no skin infections, no pressure sores or ulcerations             Psych:                         no anxiety, no depression, no nervousness, no unusual recent stress             Eyes:  no blurry vision, no floaters, no recent  vision changes, no glasses or contacts             ENT:                            + hearing loss, no loose or painful teeth, no dentures, last saw dentist October 2022             Hematologic:               + easy bruising, no abnormal bleeding, no clotting disorder, no frequent epistaxis             Endocrine:                   no diabetes, does not check CBG's at home                            Physical Exam:               BP (!) 192/94 (BP Location: Left Arm, Patient Position: Sitting, Cuff Size: Normal)    Pulse 75    Resp 20    Ht 6\' 1"  (1.854 m)    Wt 92.5 kg    SpO2 97% Comment: RA   BMI 26.91 kg/m              General:                      Fit, well-appearing             HEENT:                       Unremarkable, NCAT, PERLA, EOMI             Neck:                           no JVD, no bruits, no adenopathy              Chest:                          clear to auscultation, symmetrical breath sounds, no wheezes, no rhonchi              CV:                              RRR, 3/6 systolic murmur RSB, no diastolic murmur             Abdomen:                    soft, non-tender, no masses              Extremities:                 warm, well-perfused, pulses palpable at ankle, no lower extremity edema             Rectal/GU                   Deferred             Neuro:  Grossly non-focal and symmetrical throughout             Skin:                            Clean and dry, no rashes, no breakdown   Diagnostic Tests:   ECHOCARDIOGRAM REPORT         Patient Name:   Jordan Oconnell Date of Exam: 05/27/2020  Medical Rec #:  [id number]         Height:       72.0 in  Accession #:    [id number]        Weight:       201.0 lb  Date of Birth:  [birth year]         BSA:          2.135 m  Patient Age:    25 years          BP:           130/80 mmHg  Patient Gender: M                 HR:           73 bpm.  Exam Location:  [facility]   Procedure: 2D Echo, 3D Echo, Cardiac  Doppler, Color Doppler and Strain  Analysis   Indications:    I35 Aortic stenosis     History:        Patient has prior history of Echocardiogram examinations,  most                  recent 04/04/2019. Aortic Valve Disease, Arrythmias:Atrial                  Fibrillation, Signs/Symptoms:Syncope; Risk  Factors:Hypertension.                  CKD stage 4. Bladder cancer. Anemia.     Sonographer:    Jessee Avers, RDCS  Referring Phys: [place]     1. The aortic valve is calcified. Aortic valve regurgitation is mild.  Severe aortic valve stenosis. Aortic valve area, by VTI measures 0.92 cm.  Aortic valve mean gradient measures 43.5 mmHg. Aortic valve Vmax measures  4.21 m/s.   2. Left ventricular ejection fraction, by estimation, is 55 to 60%. The  left ventricle has normal function. The left ventricle has no regional  wall motion abnormalities. There is moderate concentric left ventricular  hypertrophy. Left ventricular  diastolic parameters are consistent with Grade I diastolic dysfunction  (impaired relaxation).   3. Right ventricular systolic function is normal. The right ventricular  size is normal. Tricuspid regurgitation signal is inadequate for assessing  PA pressure.   4. The mitral valve is grossly normal. Trivial mitral valve  regurgitation. No evidence of mitral stenosis.   5. There is mild dilatation of the ascending aorta, measuring 43 mm.   6. The inferior vena cava is normal in size with greater than 50%  respiratory variability, suggesting right atrial pressure of 3 mmHg.   Comparison(s): Changes from prior study are noted. AS is now severe.   FINDINGS   Left Ventricle: Left ventricular ejection fraction, by estimation, is 55  to 60%. The left ventricle has normal function. The left ventricle has no  regional wall motion abnormalities. Global longitudinal strain performed  but not reported based on  interpreter judgement due to  suboptimal tracking.  The left ventricular  internal cavity size was normal in size. There is moderate concentric left  ventricular hypertrophy. Left ventricular diastolic parameters are  consistent with Grade I diastolic  dysfunction (impaired relaxation).   Right Ventricle: The right ventricular size is normal. No increase in  right ventricular wall thickness. Right ventricular systolic function is  normal. Tricuspid regurgitation signal is inadequate for assessing PA  pressure.   Left Atrium: Left atrial size was normal in size.   Right Atrium: Right atrial size was normal in size.   Pericardium: Trivial pericardial effusion is present. Presence of  pericardial fat pad.   Mitral Valve: The mitral valve is grossly normal. Trivial mitral valve  regurgitation. No evidence of mitral valve stenosis.   Tricuspid Valve: The tricuspid valve is grossly normal. Tricuspid valve  regurgitation is mild . No evidence of tricuspid stenosis.   Aortic Valve: The aortic valve is calcified. Aortic valve regurgitation is  mild. Aortic regurgitation PHT measures 460 msec. Severe aortic stenosis  is present. Aortic valve mean gradient measures 43.5 mmHg. Aortic valve  peak gradient measures 70.9 mmHg.  Aortic valve area, by VTI measures 0.92 cm.   Pulmonic Valve: The pulmonic valve was grossly normal. Pulmonic valve  regurgitation is not visualized. No evidence of pulmonic stenosis.   Aorta: The aortic root is normal in size and structure. There is mild  dilatation of the ascending aorta, measuring 43 mm.   Venous: The inferior vena cava is normal in size with greater than 50%  respiratory variability, suggesting right atrial pressure of 3 mmHg.   IAS/Shunts: The atrial septum is grossly normal.      LEFT VENTRICLE  PLAX 2D  LVIDd:         4.50 cm  Diastology  LVIDs:         2.90 cm  LV e' medial:    3.32 cm/s  LV PW:         1.60 cm  LV E/e' medial:  17.8  LV IVS:        1.60 cm  LV e'  lateral:   3.15 cm/s  LVOT diam:     2.30 cm  LV E/e' lateral: 18.8  LV SV:         85  LV SV Index:   40       2D Longitudinal Strain  LVOT Area:     4.15 cm 2D Strain GLS (A2C):   -18.1 %                          2D Strain GLS (A3C):   -18.1 %                          2D Strain GLS (A4C):   -10.2 %                          2D Strain GLS Avg:     -15.5 %   RIGHT VENTRICLE  RV Basal diam:  3.70 cm  RV S prime:     11.70 cm/s  TAPSE (M-mode): 1.9 cm   LEFT ATRIUM             Index       RIGHT ATRIUM           Index  LA diam:        4.40 cm 2.06 cm/m  RA Pressure: 3.00 mmHg  LA Vol (A2C):   55.3 ml 25.90 ml/m RA Area:     14.40 cm  LA Vol (A4C):   35.8 ml 16.77 ml/m RA Volume:   29.60 ml  13.86 ml/m  LA Biplane Vol: 44.5 ml 20.84 ml/m   AORTIC VALVE  AV Area (Vmax):    0.84 cm  AV Area (Vmean):   0.85 cm  AV Area (VTI):     0.92 cm  AV Vmax:           421.03 cm/s  AV Vmean:          286.080 cm/s  AV VTI:            0.930 m  AV Peak Grad:      70.9 mmHg  AV Mean Grad:      43.5 mmHg  LVOT Vmax:         85.50 cm/s  LVOT Vmean:        58.300 cm/s  LVOT VTI:          0.205 m  LVOT/AV VTI ratio: 0.22  AI PHT:            460 msec     AORTA  Ao Root diam: 4.00 cm  Ao Asc diam:  4.30 cm   MITRAL VALVE               TRICUSPID VALVE                             Estimated RAP:  3.00 mmHg     MV E velocity: 59.10 cm/s  SHUNTS  MV A velocity: 95.20 cm/s  Systemic VTI:  0.20 m  MV E/A ratio:  0.62        Systemic Diam: 2.30 cm   Eleonore Chiquito MD  Electronically signed by Eleonore Chiquito MD  Signature Date/Time: 05/27/2020/2:39:57 PM         Final       Physicians   Panel Physicians Referring Physician Case Authorizing Physician  Sherren Mocha, MD (Primary)        Procedures   RIGHT HEART CATH AND CORONARY ANGIOGRAPHY    Conclusion       Ramus lesion is 60% stenosed.   1st Diag lesion is 90% stenosed.   2nd Diag lesion is 50% stenosed.   Prox LAD to Mid LAD  lesion is 30% stenosed.   Mid Cx lesion is 30% stenosed.   1.  Nonobstructive coronary artery disease with separate LAD and left circumflex ostia, patent LAD with mild nonobstructive plaquing, severe ostial diagonal stenoses, patent left circumflex with moderate intermediate branch stenosis and no other significant stenoses, patent nondominant RCA 2.  Known severe aortic stenosis with heavy calcification and restriction of the aortic valve leaflets seen on plain fluoroscopy 3.  Normal right heart hemodynamics with preserved cardiac output, normal pulmonary pressures, and normal LVEDP   Recommendation: 4 hours of postprocedural IV fluid, discharge from the hospital today, staged CTA scans for TAVR planning as an outpatient, cardiac surgical evaluation after CAT scans are done, medical therapy for coronary artery disease.   Procedural Details   Technical Details INDICATION: Severe symptomatic aortic stenosis.  Mr. Liford is an 81 year old gentleman with stage IV chronic kidney disease who was recently evaluated for treatment options for severe aortic stenosis.  He was brought in last night for prehydration for cardiac catheterization.  Creatinine is found to  be stable.  He presents for right and left heart catheterization today.  PROCEDURAL DETAILS: Ultrasound guidance is used to access the right brachial vein via a front wall puncture.  Ultrasound images are digitally captured and stored in the patient's chart.  A 4/5 French slender sheath is inserted.  The right wrist was then prepped, draped, and anesthetized with 1% lidocaine. Using ultrasound guidance a 5/6 French Slender sheath was placed in the right radial artery. Intra-arterial verapamil was administered through the radial artery sheath. IV heparin was administered after a JR4 catheter was advanced into the central aorta. A Swan-Ganz catheter was used for the right heart catheterization. Standard protocol was followed for recording of right heart  pressures and sampling of oxygen saturations. Fick cardiac output was calculated. Standard Judkins catheters were used for selective coronary angiography.  Attempts were not made to cross the aortic valve.  The right subclavian artery is tortuous and catheter manipulation was difficult.  There were no immediate procedural complications. The patient was transferred to the post catheterization recovery area for further monitoring.      Estimated blood loss <50 mL.   During this procedure medications were administered to achieve and maintain moderate conscious sedation while the patient's heart rate, blood pressure, and oxygen saturation were continuously monitored and I was present face-to-face 100% of this time.    Medications (Filter: Administrations occurring from 0722 to 0833 on 10/06/20)  important  Continuous medications are totaled by the amount administered until 10/06/20 0833.    Heparin (Porcine) in NaCl 1000-0.9 UT/500ML-% SOLN (mL) Total volume:  1,500 mL Date/Time Rate/Dose/Volume Action    10/06/20 0739 500 mL Given    0739 500 mL Given    0748 500 mL Given      fentaNYL (SUBLIMAZE) injection (mcg) Total dose:  25 mcg Date/Time Rate/Dose/Volume Action    10/06/20 0748 25 mcg Given      midazolam (VERSED) injection (mg) Total dose:  2 mg Date/Time Rate/Dose/Volume Action    10/06/20 0748 1 mg Given    0756 1 mg Given      lidocaine (PF) (XYLOCAINE) 1 % injection (mL) Total volume:  4 mL Date/Time Rate/Dose/Volume Action    10/06/20 0748 4 mL Given      Radial Cocktail/Verapamil only (mL) Total volume:  10 mL Date/Time Rate/Dose/Volume Action    10/06/20 0754 10 mL Given      heparin sodium (porcine) injection (Units) Total dose:  5,000 Units Date/Time Rate/Dose/Volume Action    10/06/20 0801 5,000 Units Given      iohexol (OMNIPAQUE) 350 MG/ML injection (mL) Total volume:  30 mL Date/Time Rate/Dose/Volume Action    10/06/20 0831 30 mL Given       acetaminophen (TYLENOL) tablet 650 mg (mg) Total dose:  Cannot be calculated* Dosing weight:  94.1 *Administration dose not documented Date/Time Rate/Dose/Volume Action    10/06/20 0722 *Not included in total MAR Hold      ALPRAZolam (XANAX) tablet 0.25 mg (mg) Total dose:  Cannot be calculated* Dosing weight:  94.1 *Administration dose not documented Date/Time Rate/Dose/Volume Action    10/06/20 0722 *Not included in total MAR Hold      ALPRAZolam (XANAX) tablet 0.25 mg (mg) Total dose:  Cannot be calculated* *Administration dose not documented Date/Time Rate/Dose/Volume Action    10/06/20 0722 *Not included in total MAR Hold      diltiazem (CARDIZEM CD) 24 hr capsule 180 mg (mg) Total dose:  Cannot be calculated* Dosing weight:  94.1 *Administration  dose not documented Date/Time Rate/Dose/Volume Action    10/06/20 0722 *Not included in total MAR Hold      enoxaparin (LOVENOX) injection 40 mg (mg) Total dose:  Cannot be calculated* Dosing weight:  94.1 *Administration dose not documented Date/Time Rate/Dose/Volume Action    10/06/20 0722 *Not included in total MAR Hold      escitalopram (LEXAPRO) tablet 10 mg (mg) Total dose:  Cannot be calculated* *Administration dose not documented Date/Time Rate/Dose/Volume Action    10/06/20 0722 *Not included in total MAR Hold      flecainide (TAMBOCOR) tablet 25 mg (mg) Total dose:  Cannot be calculated* *Administration dose not documented Date/Time Rate/Dose/Volume Action    10/06/20 0722 *Not included in total MAR Hold      fluticasone (FLONASE) 50 MCG/ACT nasal spray 2 spray (spray) Total dose:  Cannot be calculated* *Administration dose not documented Date/Time Rate/Dose/Volume Action    10/06/20 0722 *Not included in total MAR Hold      hydrALAZINE (APRESOLINE) tablet 50 mg (mg) Total dose:  Cannot be calculated* Dosing weight:  94.1 *Administration dose not documented Date/Time Rate/Dose/Volume Action    10/06/20  0722 *Not included in total MAR Hold      latanoprost (XALATAN) 0.005 % ophthalmic solution 1 drop (drop) Total dose:  Cannot be calculated* *Administration dose not documented Date/Time Rate/Dose/Volume Action    10/06/20 0722 *Not included in total MAR Hold      mirtazapine (REMERON) tablet 7.5 mg (mg) Total dose:  Cannot be calculated* Dosing weight:  94.1 *Administration dose not documented Date/Time Rate/Dose/Volume Action    10/06/20 0722 *Not included in total MAR Hold      nitroGLYCERIN (NITROSTAT) SL tablet 0.4 mg (mg) Total dose:  Cannot be calculated* Dosing weight:  94.1 *Administration dose not documented Date/Time Rate/Dose/Volume Action    10/06/20 0722 *Not included in total MAR Hold      ondansetron (ZOFRAN) injection 4 mg (mg) Total dose:  Cannot be calculated* Dosing weight:  94.1 *Administration dose not documented Date/Time Rate/Dose/Volume Action    10/06/20 0722 *Not included in total MAR Hold      pantoprazole (PROTONIX) EC tablet 40 mg (mg) Total dose:  Cannot be calculated* Dosing weight:  94.1 *Administration dose not documented Date/Time Rate/Dose/Volume Action    10/06/20 0722 *Not included in total MAR Hold      sodium bicarbonate tablet 1,300 mg (mg) Total dose:  Cannot be calculated* Dosing weight:  94.1 *Administration dose not documented Date/Time Rate/Dose/Volume Action    10/06/20 0722 *Not included in total MAR Hold      zolpidem (AMBIEN) tablet 5 mg (mg) Total dose:  Cannot be calculated* Dosing weight:  94.1 *Administration dose not documented Date/Time Rate/Dose/Volume Action    10/06/20 0722 *Not included in total MAR Hold      Sedation Time   Sedation Time Physician-1: 27 minutes 29 seconds Contrast   Medication Name Total Dose  iohexol (OMNIPAQUE) 350 MG/ML injection 30 mL    Radiation/Fluoro   Fluoro time: 7.3 (min) DAP: 12519 (mGycm2) Cumulative Air Kerma: 369 (mGy) Coronary Findings   Diagnostic Dominance:  Left Left Main  Vessel is large. The vessel exhibits minimal luminal irregularities.    Left Anterior Descending  There is mild diffuse disease throughout the vessel. The LAD reaches the apex. The LAD is patent throughout its course with mild diffuse nonobstructive plaquing, calcified vessel. The first diagonal is very small in caliber and has a tight ostial stenosis of 90%. The second diagonal  is a moderate caliber vessel with 50 to 60% ostial stenosis.  Prox LAD to Mid LAD lesion is 30% stenosed.    First Diagonal Branch  Vessel is small in size.  1st Diag lesion is 90% stenosed.    Second Diagonal Branch  2nd Diag lesion is 50% stenosed.    Ramus Intermedius  Ramus lesion is 60% stenosed.    Left Circumflex  There is mild diffuse disease throughout the vessel. The circumflex is a large, dominant vessel. There is an intermediate branch with moderate diffuse 60% stenosis. The AV circumflex and its OM/PLA/PDA branches are all patent with mild nonobstructive plaquing.  Mid Cx lesion is 30% stenosed.    Right Coronary Artery  Vessel is small. There is mild diffuse disease throughout the vessel. The vessel is moderately calcified. Small, nondominant vessel    Intervention    No interventions have been documented.    Coronary Diagrams   Diagnostic Dominance: Left Intervention   Implants      No implant documentation for this case.    [employer]ng[employer] Images    Show images for CARDIAC CATHETERIZATION Images on Long Term Storage    Show images for Leonte, Horrigan Link to Procedure Log   Procedure Log    Hemo Data   Flowsheet Row Most Recent Value  Fick Cardiac Output 5.69 L/min  Fick Cardiac Output Index 2.61 (L/min)/BSA  RA A Wave 6 mmHg  RA V Wave 5 mmHg  RA Mean 5 mmHg  RV Systolic Pressure 26 mmHg  RV Diastolic Pressure 1 mmHg  RV EDP 4 mmHg  PA Systolic Pressure 26 mmHg  PA Diastolic Pressure 9 mmHg  PA Mean 18 mmHg  PW A Wave 16 mmHg  PW V Wave 15 mmHg  PW Mean  14 mmHg  AO Systolic Pressure 532 mmHg  AO Diastolic Pressure 80 mmHg  AO Mean 111 mmHg  QP/QS 1  TPVR Index 6.91 HRUI  TSVR Index 43.39 HRUI  PVR SVR Ratio 0.04  TPVR/TSVR Ratio 0.16      ADDENDUM REPORT: 11/20/2020 12:45   CLINICAL DATA:  Severe Aortic Stenosis.   EXAM: Cardiac TAVR CT   TECHNIQUE: A non-contrast, gated CT scan was obtained with axial slices of 3 mm through the heart for aortic valve calcium scoring. A 100 kV retrospective, gated, contrast cardiac scan was obtained. Gantry rotation speed was 250 msecs and collimation was 0.6 mm. Nitroglycerin was not given. The 3D data set was reconstructed in 5% intervals of the 0-95% of the R-R cycle. Systolic and diastolic phases were analyzed on a dedicated workstation using MPR, MIP, and VRT modes. The patient received 100 cc of contrast.   FINDINGS: Image quality: Excellent.   Noise artifact is: Limited.   Valve Morphology: The aortic valve is tricuspid. There are severe calcifications and restricted movement in systole consistent with severe aortic stenosis. There is acquired fusion of the RCC/LCC with bulky commissural calcification.   Aortic Valve Calcium score: 6064   Aortic annular dimension:   Phase assessed: 20%   Annular area: 614 mm2   Annular perimeter: 90.4 mm   Max diameter: 31.6 mm   Min diameter: 25.6 mm   Annular and subannular calcification: Trivial annular calcium under the RCC and LCC.   Optimal coplanar projection: LAO 17 CAU 14   Coronary Artery Height above Annulus:   Left Main: 13.2 mm   Right Coronary: 18.9 mm   Sinus of Valsalva Measurements:   Non-coronary: 41 mm  Right-coronary: 43 mm   Left-coronary: 43 mm   Cusp to cusp (RCC to [facility]): 46 mm.   Sinus of Valsalva Height:   Non-coronary: 31.3 mm   Right-coronary: 29.3 mm   Left-coronary: 24.7 mm   Sinotubular Junction: 36 mm   Ascending Thoracic Aorta: 42 mm   Coronary Arteries: Normal coronary  origin. Left dominance. The study was performed without use of NTG and is insufficient for plaque evaluation. Please refer to recent cardiac catheterization for coronary assessment. Coronary calcifications noted in the LAD/LCX.   Cardiac Morphology:   Right Atrium: Right atrial size is within normal limits.   Right Ventricle: The right ventricular cavity is within normal limits.   Left Atrium: Left atrial size is normal in size with no left atrial appendage filling defect.   Left Ventricle: The ventricular cavity size is within normal limits. There are no stigmata of prior infarction. There is no abnormal filling defect. Normal left ventricular function, EF=53%. No regional wall motion abnormalities.   Pulmonary arteries: Normal in size without proximal filling defect.   Pulmonary veins: Normal pulmonary venous drainage.   Pericardium: Normal thickness with no significant effusion or calcium present.   Mitral Valve: The mitral valve is normal structure without significant calcification.   Extra-cardiac findings: See attached radiology report for non-cardiac structures.   IMPRESSION: 1. Tricuspid aortic valve with acquired fusion of the RCC/LCC. Bulky calcification noted at the commissure between the RCC/LCC.   2. Severe aortic stenosis (calcium score 6064).   3. Annular measurements appropriate for 29 mm S3 TAVR (615 mm2).   4. Trivial annular calcification.   5. Sufficient coronary to annulus distance.   6. Optimal Fluoroscopic Angle for Delivery: LAO 17 CAU 14   7. 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   [place] Bells T. Audie Box, MD     Electronically Signed   By: Eleonore Chiquito M.D.   On: 11/20/2020 12:45    Addended by Geralynn Rile, MD on 11/20/2020 12:47 PM    Study Result   Narrative & Impression  EXAM: OVER-READ INTERPRETATION  CT CHEST   The following report is an over-read performed by radiologist  Dr. Vinnie Langton of [facility], [place] on 11/20/2020. This over-read does not include interpretation of cardiac or coronary anatomy or pathology. The coronary calcium score/coronary CTA interpretation by the cardiologist is attached.   COMPARISON:  Chest CT 09/11/2013.   FINDINGS: Extracardiac findings will be described separately under dictation for contemporaneously obtained CTA chest, abdomen and pelvis.   IMPRESSION: Please see separate dictation for contemporaneously obtained CTA chest, abdomen and pelvis dated 11/20/2020 for full description of relevant extracardiac findings.   Electronically Signed: By: Vinnie Langton M.D. On: 11/20/2020 11:22      Narrative & Impression  CLINICAL DATA:  81 year old male with history of severe aortic stenosis. Preprocedural study prior to potential transcatheter aortic valve replacement (TAVR) procedure.   EXAM: CT ANGIOGRAPHY CHEST, ABDOMEN AND PELVIS   TECHNIQUE: Multidetector CT imaging through the chest, abdomen and pelvis was performed using the standard protocol during bolus administration of intravenous contrast. Multiplanar reconstructed images and MIPs were obtained and reviewed to evaluate the vascular anatomy.   CONTRAST:  49mL OMNIPAQUE IOHEXOL 350 MG/ML SOLN   COMPARISON:  CT the abdomen and pelvis 06/15/2019. Chest CT 09/11/2013.   FINDINGS: CTA CHEST FINDINGS   Cardiovascular: Heart size is mildly enlarged. There is no significant pericardial fluid, thickening or pericardial calcification. There  is aortic atherosclerosis, as well as atherosclerosis of the great vessels of the mediastinum and the coronary arteries, including calcified atherosclerotic plaque in the left anterior descending and left circumflex coronary arteries. Severe thickening and calcification of the aortic valve.   Mediastinum/Lymph Nodes: No pathologically enlarged mediastinal or hilar lymph nodes. Small hiatal hernia. No  axillary lymphadenopathy.   Lungs/Pleura: No acute consolidative airspace disease. No pleural effusions. No suspicious appearing pulmonary nodules or masses are noted.   Musculoskeletal/Soft Tissues: Old healed fracture of the right mid clavicle. There are no aggressive appearing lytic or blastic lesions noted in the visualized portions of the skeleton.   CTA ABDOMEN AND PELVIS FINDINGS   Hepatobiliary: Multiple low-attenuation lesions in the liver are compatible with simple cysts, largest of which measures 1.8 cm in diameter between segments 2 and 4A. Other subcentimeter low-attenuation lesions in the liver, too small to characterize, but statistically likely to represent tiny cysts and/or biliary hamartomas. Small calcified gallstones lying dependently in the gallbladder. No findings to suggest an acute cholecystitis at this time.   Pancreas: No pancreatic mass. No pancreatic ductal dilatation. No pancreatic or peripancreatic fluid collections or inflammatory changes.   Spleen: Unremarkable.   Adrenals/Urinary Tract: Status post left radical nephrectomy. Extensive cortical thinning in the right kidney, most severe in the lower pole. Profound urothelial thickening and mass-like soft tissue enhancement in the right renal collecting system and right renal pelvis best appreciated on axial image 170 of series 7, coronal image 72 of series 9 and sagittal image 96 of series 10 where the bulkiest portion of this measures approximately 2.6 x 1.5 x 2.6 cm. 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 In the proximal aspect of what appears to be the ileal conduit there are additional areas of soft tissue thickening and enhancement (example on axial image 180 of series 7), which could represent additional tumor deposits. No hydroureteronephrosis. Right adrenal gland  is normal in appearance. Left adrenal gland appears mildly thickened and irregular, without definite dominant nodule. Status post radical cystoprostatectomy.   Stomach/Bowel: The appearance of the stomach is normal. There is no pathologic dilatation of small bowel or colon. Large ventral hernia containing multiple loops of small bowel and a portion of the cecum.   Vascular/Lymphatic: Aortic atherosclerosis, with vascular findings and measurements pertinent to potential TAVR procedure, as detailed below. 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 No lymphadenopathy noted in the abdomen or pelvis.   Reproductive: Status post radical cystoprostatectomy.   Other: No significant volume of ascites.  No pneumoperitoneum.   Musculoskeletal: There are no aggressive appearing lytic or blastic lesions noted in the visualized portions of the skeleton.   VASCULAR MEASUREMENTS PERTINENT TO TAVR:   AORTA:   Minimal Aortic Diameter-18 x 18 mm   Severity of Aortic Calcification-mild-to-moderate   RIGHT PELVIS:   Right Common Iliac Artery -   Minimal Diameter-10.0 x 9.9 mm   Tortuosity-severe   Calcification-moderate   Right External Iliac Artery -   Minimal Diameter-11.1 x 10.7 mm   Tortuosity-severe   Calcification-mild   Right Common Femoral Artery -   Minimal Diameter-9.7 x 10.2 mm   Tortuosity-mild   Calcification-mild   LEFT PELVIS:   Left Common Iliac Artery -   Minimal Diameter-13.7 x 10.0 mm   Tortuosity-severe   Calcification-moderate   Left External Iliac Artery -  Minimal Diameter-12.2 x 9.5 mm   Tortuosity-moderate to severe   Calcification-mild   Left Common Femoral Artery -   Minimal Diameter-13.4 x 9.5 mm   Tortuosity-mild   Calcification-mild   Review of the MIP images confirms the above findings.   IMPRESSION: 1. Vascular findings and measurements pertinent to potential TAVR procedure, as  detailed above. 2. Severe thickening and calcification of the aortic valve, compatible with reported clinical history of severe aortic stenosis. 3. 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 Urologic consultation is recommended for further clinical evaluation. 4. Aortic atherosclerosis, in addition to two vessel coronary artery disease. 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 5. Mild cardiomegaly. 6. Cholelithiasis without evidence of acute cholecystitis at this time. 7. Status post left radical nephrectomy. 8. Large ventral hernia containing multiple loops of small bowel and a portion of the cecum. 9. Additional incidental findings, as above.     Electronically Signed   By: Vinnie Langton M.D.   On: 11/20/2020 12:14      STS RISK CALCULATOR: Isolated AVR: Risk of Mortality: 1.706% Renal Failure: 4.919% Permanent Stroke: 0.996% Prolonged Ventilation: 6.852% DSW Infection: 0.112% Reoperation: 5.006% Morbidity or Mortality: 16.762% Short Length of Stay: 26.380% Long Length of Stay: 5.690%   Impression:   This 81 year old gentleman has stage D, severe, symptomatic aortic stenosis with New York Heart Association class II symptoms of exertional shortness of breath and chest pressure consistent with chronic diastolic congestive heart failure.  He is an active gentleman and just recently started noticing symptoms.  I have personally reviewed his 2D echocardiogram, cardiac catheterization, and CTA studies.  His last echo was in May 2022 and showed a severely calcified aortic valve with a mean gradient of 43 mmHg consistent with severe aortic stenosis.  There is mild aortic insufficiency.  Left ventricular ejection fraction was 55 to 60%.  Cardiac  catheterization in September 2022 showed nonobstructive coronary disease.  I agree that aortic valve replacement is indicated in this patient for relief of his symptoms and to prevent left ventricular deterioration.  His recent abdominal CT scan also showed a urothelial mass in the right renal pelvis that will require further urologic work-up once his severe aortic stenosis is taken care of.  Given his age and other comorbid risk factors I think that transcatheter aortic valve replacement would be the best treatment for him.  His gated cardiac CTA shows anatomy suitable for TAVR using a 29 mm Edwards SAPIEN 3 valve.  His abdominal and pelvic CTA shows adequate pelvic vascular anatomy to allow transfemoral insertion on the right side.  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   The patient was counseled at length regarding treatment alternatives for management of severe symptomatic aortic stenosis. The risks and benefits of surgical intervention has been discussed in detail. Long-term prognosis with medical therapy was discussed. Alternative approaches such as conventional surgical aortic valve replacement, transcatheter aortic valve replacement, and palliative medical therapy were compared and contrasted at length. This discussion was placed in the context of the patient's own specific clinical presentation and past medical history. All of his questions have been addressed.    Following the decision to  proceed with transcatheter aortic valve replacement, a discussion was held regarding what types of management strategies would be attempted intraoperatively in the event of life-threatening complications, including whether or not the patient would be considered a candidate for the use of cardiopulmonary bypass and/or conversion to open  sternotomy for attempted surgical intervention.  He is 81 years old but still in very good condition and active and I think he would be a candidate for emergent sternotomy to manage any intraoperative complications.  The patient is aware of the fact that transient use of cardiopulmonary bypass may be necessary. The patient has been advised of a variety of complications that might develop including but not limited to risks of death, stroke, paravalvular leak, aortic dissection or other major vascular complications, aortic annulus rupture, device embolization, cardiac rupture or perforation, mitral regurgitation, acute myocardial infarction, arrhythmia, heart block or bradycardia requiring permanent pacemaker placement, congestive heart failure, respiratory failure, renal failure, pneumonia, infection, other late complications related to structural valve deterioration or migration, or other complications that might ultimately cause a temporary or permanent loss of functional independence or other long term morbidity. The patient provides full informed consent for the procedure as described and all questions were answered.       Plan:   Transfemoral TAVR using a SAPIEN 3 valve .   Marland Kitchen     Gaye Pollack, MD

## 2021-01-27 NOTE — Transfer of Care (Signed)
Immediate Anesthesia Transfer of Care Note  Patient: Jordan Oconnell  Procedure(s) Performed: TRANSCATHETER AORTIC VALVE REPLACEMENT, TRANSFEMORAL (Right: Chest) INTRAOPERATIVE TRANSTHORACIC ECHOCARDIOGRAM  Patient Location: Cath Lab  Anesthesia Type:MAC  Level of Consciousness: awake, alert  and oriented  Airway & Oxygen Therapy: Patient Spontanous Breathing and Patient connected to nasal cannula oxygen  Post-op Assessment: Report given to RN and Post -op Vital signs reviewed and stable  Post vital signs: Reviewed and stable  Last Vitals:  Vitals Value Taken Time  BP 126/58 01/27/21 1145  Temp 36.4 C 01/27/21 1144  Pulse 52 01/27/21 1147  Resp 16 01/27/21 1147  SpO2 92 % 01/27/21 1147  Vitals shown include unvalidated device data.  Last Pain:  Vitals:   01/27/21 1144  TempSrc: Temporal  PainSc: Asleep         Complications: There were no known notable events for this encounter.

## 2021-01-27 NOTE — Progress Notes (Signed)
Mobility Specialist: Progress Note   01/27/21 1721  Mobility  Activity Ambulated in hall  Level of Assistance Contact guard assist, steadying assist  Assistive Device None  Distance Ambulated (ft) 470 ft  Mobility Ambulated with assistance in hallway  Mobility Response Tolerated well  Mobility performed by Mobility specialist  $Mobility charge 1 Mobility   Pre-Mobility: 68 HR, 97% SpO2 Post-Mobility: 77 HR, 177/76 BP, 99% SpO2  Pt independent to sit EOB and contact guard when standing. Pt to BR and then agreeable to ambulation, no c/o throughout. Pt back to bed after walk with call bell and phone at his side. Some bleeding noted on L groin, RN aware from earlier today.   [facility] Jordan Oconnell Mobility Specialist Mobility Specialist 4 [place]: [phone] Mobility Specialist 2 [place] and [place]: [phone]

## 2021-01-27 NOTE — Op Note (Signed)
[facility]ART [facility] VASCULAR CENTER   MULTIDISCIPLINARY HEART VALVE TEAM   TAVR OPERATIVE NOTE   Date of Procedure:  01/27/2021  Preoperative Diagnosis: Severe Aortic Stenosis   Postoperative Diagnosis: Same   Procedure:   Transcatheter Aortic Valve Replacement - Percutaneous  Transfemoral Approach  Edwards Sapien 3 Resilia THV (size 29 mm, serial # [id number])   Co-Surgeons:  Gaye Pollack, MD and Sherren Mocha, MD  Anesthesiologist:  Midge Minium, MD  Echocardiographer:  Jenkins Rouge, MD  Pre-operative Echo Findings: Severe aortic stenosis Normal left ventricular systolic function  Post-operative Echo Findings: No paravalvular leak Normal/unchanged left ventricular systolic function  BRIEF CLINICAL NOTE AND INDICATIONS FOR SURGERY  81 year old gentleman with hypertension, hyperlipidemia, and urothelial cancer status post cystectomy and bilateral ureterectomy in 2015.  He underwent ileal conduit and ultimately a left nephrectomy in 2018.  He has chronic stage IV kidney disease with 1 remaining kidney, paroxysmal atrial fibrillation intolerant to anticoagulation because of recurrent hematuria, and has developed severe symptomatic aortic stenosis.  He has developed exertional chest discomfort and shortness of breath with New York Heart Association functional class II symptoms.  After undergoing extensive heart team evaluation, he is referred for TAVR.  During the course of the patient's preoperative work up they have been evaluated comprehensively by a multidisciplinary team of specialists coordinated through the [facility] in the [facility].  They have been demonstrated to suffer from symptomatic severe aortic stenosis as noted above. The patient has been counseled extensively as to the relative risks and benefits of all options for the treatment of severe aortic stenosis including long term medical therapy, conventional surgery for  aortic valve replacement, and transcatheter aortic valve replacement.  The patient has been independently evaluated in formal cardiac surgical consultation by Dr Cyndia Bent, who deemed the patient appropriate for TAVR. Based upon review of all of the patient's preoperative diagnostic tests they are felt to be candidate for transcatheter aortic valve replacement using the transfemoral approach as an alternative to conventional surgery.    Following the decision to proceed with transcatheter aortic valve replacement, a discussion has been held regarding what types of management strategies would be attempted intraoperatively in the event of life-threatening complications, including whether or not the patient would be considered a candidate for the use of cardiopulmonary bypass and/or conversion to open sternotomy for attempted surgical intervention.  The patient has been advised of a variety of complications that might develop peculiar to this approach including but not limited to risks of death, stroke, paravalvular leak, aortic dissection or other major vascular complications, aortic annulus rupture, device embolization, cardiac rupture or perforation, acute myocardial infarction, arrhythmia, heart block or bradycardia requiring permanent pacemaker placement, congestive heart failure, respiratory failure, renal failure, pneumonia, infection, other late complications related to structural valve deterioration or migration, or other complications that might ultimately cause a temporary or permanent loss of functional independence or other long term morbidity.  The patient provides full informed consent for the procedure as described and all questions were answered preoperatively.  DETAILS OF THE OPERATIVE PROCEDURE  PREPARATION:   The patient is brought to the operating room on the above mentioned date and central monitoring was established by the anesthesia team including placement of a radial arterial line. The  patient is placed in the supine position on the operating table.  Intravenous antibiotics are administered. The patient is monitored closely throughout the procedure under conscious sedation.  Baseline transthoracic echocardiogram is performed. The  patient's chest, abdomen, both groins, and both lower extremities are prepared and draped in a sterile manner. A time out procedure is performed.   PERIPHERAL ACCESS:   Using ultrasound guidance, femoral arterial and venous access is obtained with placement of 6 Fr sheaths on the left side.  [place] images are digitally captured and stored in the patient's chart. A pigtail diagnostic catheter was passed through the femoral arterial sheath under fluoroscopic guidance into the aortic root.  A temporary transvenous pacemaker catheter was passed through the femoral venous sheath under fluoroscopic guidance into the right ventricle.  The pacemaker was tested to ensure stable lead placement and pacemaker capture. Aortic root angiography was performed in order to determine the optimal angiographic angle for valve deployment.  TRANSFEMORAL ACCESS:  A micropuncture technique is used to access the right femoral artery under fluoroscopic and ultrasound guidance.  2 Perclose devices are deployed at 10' and 2' positions to 'PreClose' the femoral artery. An 8 French sheath is placed and then an Amplatz Superstiff wire is advanced through the sheath. This is changed out for a 16 French transfemoral E-Sheath after progressively dilating over the Superstiff wire.  An AL 2 catheter was used to direct a straight-tip exchange length wire across the native aortic valve into the left ventricle. This was exchanged out for a pigtail catheter and position was confirmed in the LV apex. Simultaneous LV and Ao pressures were recorded.  The pigtail catheter was exchanged for an Amplatz Extra-stiff wire in the LV apex.    BALLOON AORTIC VALVULOPLASTY:  Not performed  TRANSCATHETER HEART VALVE  DEPLOYMENT:  An Edwards Sapien 3 transcatheter heart valve (size 29 mm) was prepared and crimped per manufacturer's guidelines, and the proper orientation of the valve is confirmed on the [employer] delivery system. The valve was advanced through the introducer sheath using normal technique until in an appropriate position in the abdominal aorta beyond the sheath tip. The balloon was then retracted and using the fine-tuning wheel was centered on the valve. The valve was then advanced across the aortic arch using appropriate flexion of the catheter. The valve was carefully positioned across the aortic valve annulus. The Commander catheter was retracted using normal technique. Once final position of the valve has been confirmed by angiographic assessment, the valve is deployed while temporarily holding ventilation and during rapid ventricular pacing to maintain systolic blood pressure < 50 mmHg and pulse pressure < 10 mmHg. The balloon inflation is held for >3 seconds after reaching full deployment volume. Once the balloon has fully deflated the balloon is retracted into the ascending aorta and valve function is assessed using echocardiography. The patient's hemodynamic recovery following valve deployment is good.  The deployment balloon and guidewire are both removed. Echo demostrated acceptable post-procedural gradients, stable mitral valve function, and no aortic insufficiency.    PROCEDURE COMPLETION:  The sheath was removed and femoral artery closure is performed using the 2 previously deployed Perclose devices.  Protamine is administered once femoral arterial repair was complete. The site is clear with no evidence of bleeding or hematoma after the sutures are tightened. The temporary pacemaker and pigtail catheters are removed. Mynx closure is used for contralateral femoral arterial hemostasis for the 6 Fr sheath.  The patient tolerated the procedure well and is transported to the recovery area in  stable condition. There were no immediate intraoperative complications. All sponge instrument and needle counts are verified correct at completion of the operation.   The patient received a total of  30 mL of intravenous contrast during the procedure.   Sherren Mocha, MD 01/27/2021 1:35 PM

## 2021-01-27 NOTE — Anesthesia Procedure Notes (Signed)
Arterial Line Insertion Start/End1/03/2021 8:41 AM, 01/27/2021 8:47 AM  Patient location: Pre-op. Preanesthetic checklist: patient identified, IV checked, risks and benefits discussed, surgical consent, monitors and equipment checked, pre-op evaluation and timeout performed Lidocaine 1% used for infiltration Right, radial was placed Catheter size: 20 G Hand hygiene performed , maximum sterile barriers used  and Seldinger technique used  Attempts: 1 (after CRNA) Procedure performed using ultrasound guided technique. Ultrasound Notes:anatomy identified, needle tip was noted to be adjacent to the nerve/plexus identified, no ultrasound evidence of intravascular and/or intraneural injection and image(s) printed for medical record Following insertion, dressing applied and Biopatch. Post procedure assessment: normal  Post procedure complications: second provider assisted. Patient tolerated the procedure well with no immediate complications.

## 2021-01-27 NOTE — Progress Notes (Signed)
°  [place] VALVE TEAM  Patient doing well s/p TAVR. He is hemodynamically stable. Groin sites stable. ECG with old 1st deg AV  and new LBBB. No evidence of high grade block. Arterial line discontinued and transferred to 4E. Plan for early ambulation after bedrest completed and hopeful discharge over the next 24-48 hours.   Angelena Form PA-C  [facility]  Pager [phone]

## 2021-01-27 NOTE — Interval H&P Note (Signed)
History and Physical Interval Note:  01/27/2021 10:01 AM  Jordan Oconnell  has presented today for surgery, with the diagnosis of Severe Aortic Stenosis.  The various methods of treatment have been discussed with the patient and family. After consideration of risks, benefits and other options for treatment, the patient has consented to  Procedure(s): TRANSCATHETER AORTIC VALVE REPLACEMENT, TRANSFEMORAL (Right) INTRAOPERATIVE TRANSTHORACIC ECHOCARDIOGRAM (N/A) as a surgical intervention.  The patient's history has been reviewed, patient examined, no change in status, stable for surgery.  I have reviewed the patient's chart and labs.  Questions were answered to the patient's satisfaction.     Gaye Pollack

## 2021-01-27 NOTE — Plan of Care (Signed)
°  Problem: Clinical Measurements: Goal: Will remain free from infection Outcome: Progressing Goal: Diagnostic test results will improve Outcome: Progressing   Problem: Activity: Goal: Risk for activity intolerance will decrease Outcome: Progressing   Problem: Nutrition: Goal: Adequate nutrition will be maintained Outcome: Progressing

## 2021-01-28 ENCOUNTER — Inpatient Hospital Stay (HOSPITAL_COMMUNITY): Payer: Medicare PPO

## 2021-01-28 DIAGNOSIS — Z952 Presence of prosthetic heart valve: Secondary | ICD-10-CM

## 2021-01-28 LAB — CBC
HCT: 39.7 % (ref 39.0–52.0)
Hemoglobin: 12.5 g/dL — ABNORMAL LOW (ref 13.0–17.0)
MCH: 29.9 pg (ref 26.0–34.0)
MCHC: 31.5 g/dL (ref 30.0–36.0)
MCV: 95 fL (ref 80.0–100.0)
Platelets: UNDETERMINED 10*3/uL (ref 150–400)
RBC: 4.18 MIL/uL — ABNORMAL LOW (ref 4.22–5.81)
RDW: 14.1 % (ref 11.5–15.5)
WBC: 6.6 10*3/uL (ref 4.0–10.5)
nRBC: 0 % (ref 0.0–0.2)

## 2021-01-28 LAB — BASIC METABOLIC PANEL
Anion gap: 8 (ref 5–15)
BUN: 43 mg/dL — ABNORMAL HIGH (ref 8–23)
CO2: 17 mmol/L — ABNORMAL LOW (ref 22–32)
Calcium: 8.6 mg/dL — ABNORMAL LOW (ref 8.9–10.3)
Chloride: 111 mmol/L (ref 98–111)
Creatinine, Ser: 2.35 mg/dL — ABNORMAL HIGH (ref 0.61–1.24)
GFR, Estimated: 27 mL/min — ABNORMAL LOW (ref >60)
Glucose, Bld: 94 mg/dL (ref 70–99)
Potassium: 4.3 mmol/L (ref 3.5–5.1)
Sodium: 136 mmol/L (ref 135–145)

## 2021-01-28 LAB — ECHOCARDIOGRAM COMPLETE
AR max vel: 1.53 cm2
AV Area VTI: 1.41 cm2
AV Area mean vel: 1.47 cm2
AV Mean grad: 11 mmHg
AV Peak grad: 17.5 mmHg
Ao pk vel: 2.09 m/s
Area-P 1/2: 5.09 cm2
Calc EF: 45.1 %
Height: 73 in
S' Lateral: 3.3 cm
Single Plane A2C EF: 47.1 %
Single Plane A4C EF: 45.2 %
Weight: 3252.23 oz

## 2021-01-28 LAB — MAGNESIUM: Magnesium: 1.8 mg/dL (ref 1.7–2.4)

## 2021-01-28 MED FILL — Heparin Sod (Porcine)-NaCl IV Soln 1000 Unit/500ML-0.9%: INTRAVENOUS | Qty: 500 | Status: AC

## 2021-01-28 MED FILL — Lidocaine HCl Local Preservative Free (PF) Inj 1%: INTRAMUSCULAR | Qty: 30 | Status: AC

## 2021-01-28 NOTE — Progress Notes (Signed)
Notified by CCMD that patient went into afib. Patient asymptomatic and resting. Will continue to monitor.

## 2021-01-28 NOTE — Progress Notes (Addendum)
[place] VALVE TEAM  Patient Name: Jordan Oconnell Date of Encounter: 01/28/2021  Admit date: 01/27/2021  Primary Care Provider: Burnard Bunting, MD [facility] Cardiologist: Sanda Klein, MD / Dr. Burt Knack & Dr. Cyndia Bent (TAVR) [facility] Electrophysiologist:  None   Hospital Problem List     Principal Problem:   S/P TAVR (transcatheter aortic valve replacement) Active Problems:   Urothelial cancer ([place])   Essential hypertension   PAF (paroxysmal atrial fibrillation) (HCC)   Bladder cancer ([place])   Protein-calorie malnutrition, severe (HCC)   CKD (chronic kidney disease) stage 4, GFR 15-29 ml/min (HCC)   Severe aortic stenosis     Subjective   Getting echo when seen. No complaints. Walked the halls with no issues.   Inpatient Medications    Scheduled Meds:  ALPRAZolam  0.25 mg Oral QHS   aspirin  81 mg Oral Daily   diltiazem  180 mg Oral Daily   escitalopram  10 mg Oral q morning   flecainide  25 mg Oral Q12H   hydrALAZINE  50 mg Oral BID   mirtazapine  7.5 mg Oral QHS   pantoprazole  40 mg Oral Daily   sodium bicarbonate  1,300 mg Oral BID   sodium chloride flush  3 mL Intravenous Q12H   Continuous Infusions:  sodium chloride 500 mL/hr at 01/27/21 1134   sodium chloride     sodium chloride     nitroGLYCERIN     PRN Meds: sodium chloride, acetaminophen **OR** acetaminophen, morphine injection, ondansetron (ZOFRAN) IV, oxyCODONE, sodium chloride flush, traMADol   Vital Signs    Vitals:   01/27/21 2324 01/28/21 0500 01/28/21 0514 01/28/21 0809  BP: (!) 142/70  (!) 164/79 (!) 143/74  Pulse: 76  73 72  Resp: 20  20 18   Temp: 98.1 F (36.7 C)  98.2 F (36.8 C) 98.5 F (36.9 C)  TempSrc: Oral  Oral Oral  SpO2: 92%  91% 95%  Weight:  92.2 kg    Height:        Intake/Output Summary (Last 24 hours) at 01/28/2021 1014 Last data filed at 01/28/2021 0700 Gross per 24 hour  Intake 1864.84 ml  Output 3820 ml   Net -1955.16 ml   Filed Weights   01/27/21 0731 01/27/21 0820 01/28/21 0500  Weight: (P) 92.1 kg 92.1 kg 92.2 kg    Physical Exam    GEN: Well nourished, well developed, in no acute distress.  HEENT: Grossly normal.  Neck: Supple, no JVD, carotid bruits, or masses. Cardiac: RRR, no murmurs, rubs, or gallops. No clubbing, cyanosis, edema.   Respiratory:  Respirations regular and unlabored, clear to auscultation bilaterally. GI: Soft, nontender, nondistended, BS + x 4. MS: no deformity or atrophy. Skin: warm and dry, no rash.  Groin sites clear without hematoma or ecchymosis  Neuro:  Strength and sensation are intact. Psych: AAOx3.  Normal affect.  Labs    CBC Recent Labs    01/27/21 1158 01/28/21 0432  WBC  --  6.6  HGB 11.9* 12.5*  HCT 35.0* 39.7  MCV  --  95.0  PLT  --  PLATELET CLUMPS NOTED ON SMEAR, UNABLE TO ESTIMATE   Basic Metabolic Panel Recent Labs    01/27/21 1158 01/28/21 0432  NA 142 136  K 4.3 4.3  CL 111 111  CO2  --  17*  GLUCOSE 128* 94  BUN 43* 43*  CREATININE 2.40* 2.35*  CALCIUM  --  8.6*  MG  --  1.8   Liver Function Tests No results for input(s): AST, ALT, ALKPHOS, BILITOT, PROT, ALBUMIN in the last 72 hours. No results for input(s): LIPASE, AMYLASE in the last 72 hours. Cardiac Enzymes No results for input(s): CKTOTAL, CKMB, CKMBINDEX, TROPONINI in the last 72 hours. BNP Invalid input(s): POCBNP D-Dimer No results for input(s): DDIMER in the last 72 hours. Hemoglobin A1C No results for input(s): HGBA1C in the last 72 hours. Fasting Lipid Panel No results for input(s): CHOL, HDL, LDLCALC, TRIG, CHOLHDL, LDLDIRECT in the last 72 hours. Thyroid Function Tests No results for input(s): TSH, T4TOTAL, T3FREE, THYROIDAB in the last 72 hours.  Invalid input(s): FREET3  Telemetry    Sinus with brief run of afib, widq QRS- Personally Reviewed  ECG    Sinus with 1st deg AV block and new LBBB  - Personally Reviewed  Radiology     ECHOCARDIOGRAM LIMITED  Result Date: 01/27/2021    ECHOCARDIOGRAM LIMITED REPORT   Patient Name:   Jordan Oconnell Date of Exam: 01/27/2021 Medical Rec #:  [id number]         Height:       73.0 in Accession #:    [id number]        Weight:       203.0 lb Date of Birth:  08-16-1940         BSA:          2.165 m Patient Age:    81 years          BP:           118/67 mmHg Patient Gender: M                 HR:           67 bpm. Exam Location:  Inpatient Procedure: Limited Echo, Color Doppler and Cardiac Doppler Indications:     Aortic Stenosis i35.0  History:         Patient has prior history of Echocardiogram examinations.  Sonographer:     Raquel Sarna Senior RDCS Referring Phys:  [place] Diagnosing Phys: Jenkins Rouge MD  Sonographer Comments: 38mm Edwards Sapien 3 Ultra TAVR Implanted IMPRESSIONS  1. Left ventricular ejection fraction, by estimation, is 50 to 55%. The left ventricle has low normal function. The left ventricle demonstrates global hypokinesis. The left ventricular internal cavity size was mildly dilated. There is moderate left ventricular hypertrophy. Left ventricular diastolic function could not be evaluated.  2. The mitral valve is abnormal. Trivial mitral valve regurgitation. Moderate mitral annular calcification.  3. Pre TAVR : Tri leaflet AV with severe stentosis and calcification mean gradient supine in cath lab 35 peak 61 mmHg AVA 0.56 cm2 trivial AR         Post TAVR: well seated 29 mm Sapian 3 Ultra valve with no PVL mean gradient 3 peak gradient 6 mmHg AVA 2.2 cm2 Suboptimal apical images . The aortic valve has been repaired/replaced. Aortic valve regurgitation is not visualized. FINDINGS  Left Ventricle: Left ventricular ejection fraction, by estimation, is 50 to 55%. The left ventricle has low normal function. The left ventricle demonstrates global hypokinesis. The left ventricular internal cavity size was mildly dilated. There is moderate left ventricular hypertrophy. Left  ventricular diastolic function could not be evaluated. Left Atrium: Left atrial size was not assessed. Right Atrium: Right atrial size was not assessed. Pericardium: There is no evidence of pericardial effusion. Mitral Valve: The mitral valve is abnormal. There is mild thickening of the  mitral valve leaflet(s). There is mild calcification of the mitral valve leaflet(s). Moderate mitral annular calcification. Trivial mitral valve regurgitation. Tricuspid Valve: The tricuspid valve is not assessed. Aortic Valve: Pre TAVR : Tri leaflet AV with severe stentosis and calcification mean gradient supine in cath lab 35 peak 61 mmHg AVA 0.56 cm2 trivial AR Post TAVR: well seated 29 mm Sapian 3 Ultra valve with no PVL mean gradient 3 peak gradient 6 mmHg AVA 2.2 cm2 Suboptimal apical images. The aortic valve has been repaired/replaced. Aortic valve regurgitation is not visualized. Aortic valve mean gradient  measures 3.0 mmHg. Aortic valve peak gradient measures 5.8 mmHg. Aortic valve area, by VTI measures 2.16 cm. Pulmonic Valve: The pulmonic valve was not assessed. IAS/Shunts: The interatrial septum was not assessed. LEFT VENTRICLE PLAX 2D LVOT diam:     2.10 cm LV SV:         57 LV SV Index:   26 LVOT Area:     3.46 cm  AORTIC VALVE AV Area (Vmax):    2.16 cm AV Area (Vmean):   2.13 cm AV Area (VTI):     2.16 cm AV Vmax:           120.00 cm/s AV Vmean:          88.100 cm/s AV VTI:            0.265 m AV Peak Grad:      5.8 mmHg AV Mean Grad:      3.0 mmHg LVOT Vmax:         75.00 cm/s LVOT Vmean:        54.300 cm/s LVOT VTI:          0.165 m LVOT/AV VTI ratio: 0.62  SHUNTS Systemic VTI:  0.16 m Systemic Diam: 2.10 cm Jenkins Rouge MD Electronically signed by Jenkins Rouge MD Signature Date/Time: 01/27/2021/12:05:23 PM    Final    Structural Heart Procedure  Result Date: 01/27/2021 See surgical note for result.   Cardiac Studies   TAVR OPERATIVE NOTE     Date of Procedure:                01/27/2021   Preoperative  Diagnosis:      Severe Aortic Stenosis    Postoperative Diagnosis:    Same    Procedure:        Transcatheter Aortic Valve Replacement - Percutaneous Right Transfemoral Approach             Edwards Sapien 3 Ultra Resilia THV (size 29 mm, model # 9755RSL, serial # [id number])              Co-Surgeons:                        Gaye Pollack, MD and Sherren Mocha, MD       Anesthesiologist:                  Jenita Seashore, MD   Echocardiographer:              Edmonia James, MD   Pre-operative Echo Findings: Severe aortic stenosis Normal left ventricular systolic function   Post-operative Echo Findings: No paravalvular leak Normal left ventricular systolic function  _____________________  Echo 01/28/21: completed but pending formal read at the time of discharge   Patient Profile     Jordan Oconnell is a 81 y.o. male with a history of of extensive urothelial cancer history s/p  cystectomy, bilateral ureterectomy in 2015 with ileal conduit followed by left nephrectomy in 2018, paroxysmal atrial fibrillation on Flecanide and diltiazem (not anticoagulated given recurrent hematuria), chronic hyperchloremic metabolic acidosis treated with sodium bicarbonate tablets, CKD stage IV, HTN, HLD and severe aortic stenosis who presented to [facility] on 01/27/21 for planned TAVR.   Assessment & Plan    Severe AS: s/p successful TAVR with a 29 mm Edwards Sapien 3 Ultra Resilia THV via the TF approach on 01/27/21. Post operative echo completed but pending formal read. Personally reviewed and looks like normal EF and valve function with a mean gradient of 10 mm hg and no PVL. Groin sites are stable. ECG with sinus with old 1st deg block but new LBBB. There has been no evidence of HAVB. Continue Asprin alone. Plan to keep one more day given conduction changes. Plan for DC home tomorrow with a Zio.   CAD: pre TAVR cath showed nonobstructive coronary artery disease with separate LAD and left circumflex ostia, patent LAD with  mild nonobstructive plaquing, severe ostial diagonal stenoses, patent left circumflex with moderate intermediate branch stenosis and no other significant stenoses, patent nondominant RCA. Continue medical therapy.   PAF: patient had a brief run of afib on tele. Continue flecainide and diltiazem. No OAC given ongoing issues with hematuria  HTN: BP has been a little elevated. Resumed on home meds. Continue to monitor.   Chronic hyperchloremic metabolic acidosis: continue home sodium bicarbonate tablets  CKD stage IV: creat 2.35 with a GFR of 27. This has remained stable.   Urologic disease: he has a history of urothelial cancer history s/p cystectomy, bilateral ureterectomy in 2015 with ileal conduit followed by left nephrectomy in 2018. Pre TAVR Ct scans showed a urothelial mass in the right renal pelvis, with additional areas of soft tissue thickening and enhancement within the right ileal conduit, concerning for recurrent urothelial neoplasm. He is closely followed by Dr. [name] with [facility] who plans for biopsy after TAVR.  Signed, Angelena Form, PA-C  01/28/2021, 10:14 AM  Pager [phone]  Patient seen, examined. Available data reviewed. Agree with findings, assessment, and plan as outlined by Nell Range, PA-C.  The patient is independently interviewed and examined this morning.  He is alert, oriented, elderly male in no distress.  Heart is regular rate and rhythm with a soft 1/6 systolic ejection murmur at the right upper sternal border.  Lungs are clear, abdomen is soft and nontender, bilateral groin sites are clear with no hematoma or ecchymosis, extremities have no pretibial edema.  Telemetry is reviewed and shows sinus rhythm with left bundle branch block.  There was a period of atrial fibrillation overnight.  I have recommended that we observe him on telemetry for another 24 hours considering his new left bundle branch block to make sure that he does not further prolong his QRS  or PR intervals.  As long as he is stable, I would anticipate hospital discharge tomorrow on an outpatient monitor.  We will review his echocardiogram today once it is completed.  Sherren Mocha, M.D. 01/28/2021 1:59 PM

## 2021-01-28 NOTE — Progress Notes (Signed)
°  [facility] VASCULAR CENTER   MULTIDISCIPLINARY HEART VALVE TEAM   Called to see thepatient for leg pain. Patient in no distress at the time of evaluation. Pain located in right lower, inner thigh with pinpoint tenderness with palpation. No evidence of bleeding, hematoma or other complication from procedure site. VSS. Recommended the use of heat pack and Tylenol.   Kathyrn Drown NP-C Structural Heart Team  Pager: [phone] Phone: [phone]

## 2021-01-28 NOTE — Progress Notes (Addendum)
Mobility Specialist: Progress Note   01/28/21 1602  Mobility  Activity Ambulated in hall  Level of Assistance Contact guard assist, steadying assist  Assistive Device None  Distance Ambulated (ft) 1000 ft  Mobility Ambulated with assistance in hallway  Mobility Response Tolerated well  Mobility performed by Mobility specialist  $Mobility charge 1 Mobility   Pre-Mobility: 71 HR Post-Mobility: 83 HR, 153/75 BP, 97% SpO2  Pt independent to sit EOB as well as to stand. Contact guard during ambulation d/t mild unsteadiness. Pt c/o discomfort in his LLE he said has persisted throughout the Jordan Muramoto, RN notified. Pt back to bed after walk with call bell and phone at his side.    [facility] Jordan Oconnell Mobility Specialist Mobility Specialist [address] [place]: [phone] Mobility Specialist [address] [place]: [phone]

## 2021-01-28 NOTE — Progress Notes (Signed)
Patient converted back to NSR.

## 2021-01-28 NOTE — Progress Notes (Signed)
CARDIAC REHAB PHASE I   PRE:  Rate/Rhythm: 72 SR LBBB    BP: sitting 127/60    SaO2:   MODE:  Ambulation: 940 ft   POST:  Rate/Rhythm: 97 SR    BP: sitting 145/64     SaO2: 95 RA  Pt ambulated with standby assist. No c/o, feels well. Increased distance. To recliner. Discussed restrictions, exercise, and CRPII. Will refer to [place]. Pt and daughter asked questions regarding apple watch to watch for afib. He sts he does not remember having hematuria with anticoagulant. Encouraged him to discuss with MD.  [phone]   [place], ACSM 01/28/2021 11:27 AM

## 2021-01-29 ENCOUNTER — Telehealth: Payer: Self-pay | Admitting: Cardiology

## 2021-01-29 ENCOUNTER — Inpatient Hospital Stay (HOSPITAL_COMMUNITY)
Admission: RE | Admit: 2021-01-29 | Discharge: 2021-01-29 | Disposition: A | Discharge status: Home / Self Care | Payer: Medicare PPO | Source: Home / Self Care | Attending: Physician Assistant | Admitting: Physician Assistant

## 2021-01-29 DIAGNOSIS — I447 Left bundle-branch block, unspecified: Secondary | ICD-10-CM

## 2021-01-29 DIAGNOSIS — I440 Atrioventricular block, first degree: Secondary | ICD-10-CM

## 2021-01-29 DIAGNOSIS — Z952 Presence of prosthetic heart valve: Secondary | ICD-10-CM

## 2021-01-29 DIAGNOSIS — I480 Paroxysmal atrial fibrillation: Secondary | ICD-10-CM

## 2021-01-29 LAB — BASIC METABOLIC PANEL
Anion gap: 8 (ref 5–15)
BUN: 40 mg/dL — ABNORMAL HIGH (ref 8–23)
CO2: 18 mmol/L — ABNORMAL LOW (ref 22–32)
Calcium: 8.5 mg/dL — ABNORMAL LOW (ref 8.9–10.3)
Chloride: 110 mmol/L (ref 98–111)
Creatinine, Ser: 2.27 mg/dL — ABNORMAL HIGH (ref 0.61–1.24)
GFR, Estimated: 28 mL/min — ABNORMAL LOW (ref >60)
Glucose, Bld: 102 mg/dL — ABNORMAL HIGH (ref 70–99)
Potassium: 4.2 mmol/L (ref 3.5–5.1)
Sodium: 136 mmol/L (ref 135–145)

## 2021-01-29 LAB — CBC
HCT: 37.1 % — ABNORMAL LOW (ref 39.0–52.0)
Hemoglobin: 11.9 g/dL — ABNORMAL LOW (ref 13.0–17.0)
MCH: 30.2 pg (ref 26.0–34.0)
MCHC: 32.1 g/dL (ref 30.0–36.0)
MCV: 94.2 fL (ref 80.0–100.0)
Platelets: 79 10*3/uL — ABNORMAL LOW (ref 150–400)
RBC: 3.94 MIL/uL — ABNORMAL LOW (ref 4.22–5.81)
RDW: 14 % (ref 11.5–15.5)
WBC: 6.3 10*3/uL (ref 4.0–10.5)
nRBC: 0 % (ref 0.0–0.2)

## 2021-01-29 MED ORDER — ASPIRIN 81 MG PO CHEW: 81.0000 mg | CHEWABLE_TABLET | Freq: Every day | ORAL | Status: DC

## 2021-01-29 NOTE — Progress Notes (Signed)
°  Transition of Care [facility]) Screening Note   Patient Details  Name: HALEN ANTENUCCI Date of Birth: 02-Oct-1940   Transition of Care [facility]) CM/SW Contact:    Dawayne Patricia, RN Phone Number: 01/29/2021, 11:56 AM    Transition of Care Department [facility] has reviewed patient and no TOC needs have been identified at this time. We will continue to monitor patient advancement through interdisciplinary progression rounds. If new patient transition needs arise, please place a TOC consult.

## 2021-01-29 NOTE — Progress Notes (Signed)
Notified by CCMD that patient's HR dropped to 30-40's. Patient also switching back and forth from afib/SR/SB with pauses. Possibly some aflutter??  Longest pause 2.36. EKG done and placed in chart. Patient asymptomatic. Patient unaware of heart rate elevating or dropping while sleeping.  Will continue to monitor

## 2021-01-29 NOTE — Telephone Encounter (Signed)
Pt had 2 runs of PAF tonight first run 54 to 97 for 90 sec the second lasted longer at 76 to 117.  I called the pt but no answer.  I left message to call us back.  - unable to have anticoagulation and flecainide and dilt stopped.  If y'all could call on the 6th of Jan if he does not call us back.

## 2021-01-29 NOTE — Discharge Summary (Signed)
[facility] VALVE TEAM  Discharge Summary    Patient ID: Jordan Oconnell MRN: [id number]; DOB: 1940-11-25  Admit date: 01/27/2021 Discharge date: 01/29/2021  Primary Care Provider: Burnard Bunting, MD  Primary Cardiologist: Sanda Klein, MD   Discharge Diagnoses    Principal Problem:   S/P TAVR (transcatheter aortic valve replacement) Active Problems:   Urothelial cancer ([place])   Essential hypertension   PAF (paroxysmal atrial fibrillation) (HCC)   Bladder cancer ([place])   Protein-calorie malnutrition, severe ([place])   CKD (chronic kidney disease) stage 4, GFR 15-29 ml/min (HCC)   Severe aortic stenosis   LBBB (left bundle branch block)   1st degree AV block   Allergies Allergies  Allergen Reactions   Amoxicillin Other (See Comments)    Acute interstitial nephritis   Doxycycline Hives   Flomax [Tamsulosin Hcl] Other (See Comments)    Dizzy     Diagnostic Studies/Procedures    TAVR OPERATIVE NOTE     Date of Procedure:                01/27/2021   Preoperative Diagnosis:      Severe Aortic Stenosis    Postoperative Diagnosis:    Same    Procedure:        Transcatheter Aortic Valve Replacement - Percutaneous Right Transfemoral Approach             Edwards Sapien 3 Ultra Resilia THV (size 29 mm, model # 9755RSL, serial # [id number])              Co-Surgeons:                        Gaye Pollack, MD and Sherren Mocha, MD       Anesthesiologist:                  Jenita Seashore, MD   Echocardiographer:              Edmonia James, MD   Pre-operative Echo Findings: Severe aortic stenosis Normal left ventricular systolic function   Post-operative Echo Findings: No paravalvular leak Normal left ventricular systolic function   _____________________   Echo 01/28/21:  IMPRESSIONS   1. The aortic valve has been repaired/replaced. There is a 29 mm Edwards  Ultra, stented (TAVR) valve present in the aortic position. Echo findings   are consistent with normal structure and function of the aortic valve  prosthesis. Mean gradient 43mmHg,  Vmax 2.0 m/s, DI 0.52. There is no paravalvular leak.   2. Left ventricular ejection fraction, by estimation, is 50 to 55%. The  left ventricle has low normal function. The left ventricle has no regional  wall motion abnormalities. There is moderate concentric left ventricular  hypertrophy. Left ventricular  diastolic parameters are consistent with Grade II diastolic dysfunction  (pseudonormalization). Elevated left atrial pressure.   3. Right ventricular systolic function is normal. The right ventricular  size is normal.   4. Left atrial size was moderately dilated.   5. The mitral valve is grossly normal. Mild mitral valve regurgitation.  Moderate mitral annular calcification.   6. Aortic dilatation noted. There is mild dilatation of the ascending  aorta, measuring 42 mm.   7. The inferior vena cava is normal in size with greater than 50%  respiratory variability, suggesting right atrial pressure of 3 mmHg.  History of Present Illness     Jordan Oconnell is a 81 y.o.  male with a history of extensive urothelial cancer history s/p cystectomy, bilateral ureterectomy in 2015 with ileal conduit followed by left nephrectomy in 2018, paroxysmal atrial fibrillation on Flecanide and diltiazem (not anticoagulated given recurrent hematuria), chronic hyperchloremic metabolic acidosis treated with sodium bicarbonate tablets, CKD stage IV, HTN, HLD and severe aortic stenosis who presented to [facility]27[facility]21 for planned TAVR.   The patient has been followed for several years with aortic stenosis and only recently has become symptomatic.  He has been physically active over time playing pickle ball and walking. He recently developed some exertional dyspnea and chest pressure. He was referred for sturcurual heart consultation. L/RHC showed nonobstructive coronary artery disease with separate LAD and  left circumflex ostia, patent LAD with mild nonobstructive plaquing, severe ostial diagonal stenoses, patent left circumflex with moderate intermediate branch stenosis and no other significant stenoses, patent nondominant RCA.  The patient has been evaluated by the multidisciplinary valve team and felt to have severe, symptomatic aortic stenosis and to be a suitable candidate for TAVR, which was set up for 01/27/2021.   Hospital Course     Consultants: EP   Severe AS: s/p successful TAVR with a 29 mm Edwards Sapien 3 Ultra Resilia THV via the TF approach on 01/27/21. Post operative echo showed EF 50-55%, normally functioning TAVR with a mean gradient of 10 mmHg and no PVL. Groin sites are stable. Continue Asprin alone. Plan for discharge home today with close follow up in the office next week.    PAF: he has been on flecainide and diltiazem in the outpatient setting. No OAC given ongoing issues with hematuria. Patient had recurrent afib/flutter while admitted. Additionally, he had some pauses up to ~3-4 seconds. All of this was asymptomatic. Given underlying conduction disease with 1st deg AV block, new LBBB after TAVR, recurrent afib with pauses, EP to consulted for recommendations. They recommended discontinuation of flecainide and ambulatory telemetry prior to discharge with a Zio Patch. Could potentially be a Watchman candidate in the future, but needs to stay of [place] until urologic biopsy completed. Will discuss in the outpatient setting.     CAD: pre TAVR cath showed nonobstructive coronary artery disease with separate LAD and left circumflex ostia, patent LAD with mild nonobstructive plaquing, severe ostial diagonal stenoses, patent left circumflex with moderate intermediate branch stenosis and no other significant stenoses, patent nondominant RCA. Continue medical therapy.    HTN: BP has been a little elevated, but normal on last check. Resumed on home meds.   Chronic hyperchloremic metabolic  acidosis: continue home sodium bicarbonate tablets   CKD stage IV: creat 2.27 with a GFR of 28. This has remained stable.    Urologic disease: he has a history of urothelial cancer history s/p cystectomy, bilateral ureterectomy in 2015 with ileal conduit followed by left nephrectomy in 2018. Pre TAVR CT scans showed a urothelial mass in the right renal pelvis, with additional areas of soft tissue thickening and enhancement within the right ileal conduit, concerning for recurrent urothelial neoplasm. He is closely followed by Dr. [name] with [facility] who plans for biopsy after TAVR.  Thrombocytopenia: Plts 79 at discharge. Likely reactive. Will continue to monitor.  _____________  Discharge Vitals Blood pressure 129/72, pulse 60, temperature 98.1 F (36.7 C), temperature source Oral, resp. rate 19, height 6\' 1"  (1.854 m), weight 91.3 kg, SpO2 97 %.  Filed Weights   01/27/21 0820 01/28/21 0500 01/29/21 0502  Weight: 92.1 kg 92.2 kg 91.3 kg    Labs &  Radiologic Studies    CBC Recent Labs    01/28/21 0432 01/29/21 0429  WBC 6.6 6.3  HGB 12.5* 11.9*  HCT 39.7 37.1*  MCV 95.0 94.2  PLT PLATELET CLUMPS NOTED ON SMEAR, UNABLE TO ESTIMATE 79*   Basic Metabolic Panel Recent Labs    01/28/21 0432 01/29/21 0429  NA 136 136  K 4.3 4.2  CL 111 110  CO2 17* 18*  GLUCOSE 94 102*  BUN 43* 40*  CREATININE 2.35* 2.27*  CALCIUM 8.6* 8.5*  MG 1.8  --    Liver Function Tests No results for input(s): AST, ALT, ALKPHOS, BILITOT, PROT, ALBUMIN in the last 72 hours. No results for input(s): LIPASE, AMYLASE in the last 72 hours. Cardiac Enzymes No results for input(s): CKTOTAL, CKMB, CKMBINDEX, TROPONINI in the last 72 hours. BNP Invalid input(s): POCBNP D-Dimer No results for input(s): DDIMER in the last 72 hours. Hemoglobin A1C No results for input(s): HGBA1C in the last 72 hours. Fasting Lipid Panel No results for input(s): CHOL, HDL, LDLCALC, TRIG, CHOLHDL, LDLDIRECT in the  last 72 hours. Thyroid Function Tests No results for input(s): TSH, T4TOTAL, T3FREE, THYROIDAB in the last 72 hours.  Invalid input(s): FREET3 _____________  DG Chest 2 View  Result Date: 01/23/2021 CLINICAL DATA:  Preop for transcatheter aortic valve replacement. EXAM: CHEST - 2 VIEW COMPARISON:  July 26, 2013. FINDINGS: The heart size and mediastinal contours are within normal limits. Both lungs are clear. The visualized skeletal structures are unremarkable. IMPRESSION: No active cardiopulmonary disease. Electronically Signed   By: Marijo Conception M.D.   On: 01/23/2021 16:34   ECHOCARDIOGRAM COMPLETE  Result Date: 01/28/2021    ECHOCARDIOGRAM REPORT   Patient Name:   Jordan Oconnell Date of Exam: 01/28/2021 Medical Rec #:  [id number]         Height:       73.0 in Accession #:    [id number]        Weight:       203.3 lb Date of Birth:  12/17/1940         BSA:          2.166 m Patient Age:    30 years          BP:           164/79 mmHg Patient Gender: M                 HR:           74 bpm. Exam Location:  Inpatient Procedure: 2D Echo, Cardiac Doppler and Color Doppler Indications:    S/p TAVR  History:        Patient has prior history of Echocardiogram examinations.                 Arrythmias:Atrial Fibrillation; Risk Factors:Hypertension.                 Aortic Valve: 29 mm Edwards Ultra, stented (TAVR) valve is                 present in the aortic position.  Sonographer:    Jyl Heinz Referring Phys: [id number] [place]  1. The aortic valve has been repaired/replaced. There is a 29 mm Edwards Ultra, stented (TAVR) valve present in the aortic position. Echo findings are consistent with normal structure and function of the aortic valve prosthesis. Mean gradient 15mmHg, Vmax 2.0 m/s, DI 0.52. There is no paravalvular leak.  2. Left  ventricular ejection fraction, by estimation, is 50 to 55%. The left ventricle has low normal function. The left ventricle has no regional wall motion  abnormalities. There is moderate concentric left ventricular hypertrophy. Left ventricular diastolic parameters are consistent with Grade II diastolic dysfunction (pseudonormalization). Elevated left atrial pressure.  3. Right ventricular systolic function is normal. The right ventricular size is normal.  4. Left atrial size was moderately dilated.  5. The mitral valve is grossly normal. Mild mitral valve regurgitation. Moderate mitral annular calcification.  6. Aortic dilatation noted. There is mild dilatation of the ascending aorta, measuring 42 mm.  7. The inferior vena cava is normal in size with greater than 50% respiratory variability, suggesting right atrial pressure of 3 mmHg. FINDINGS  Left Ventricle: Left ventricular ejection fraction, by estimation, is 50 to 55%. The left ventricle has low normal function. The left ventricle has no regional wall motion abnormalities. The left ventricular internal cavity size was normal in size. There is moderate concentric left ventricular hypertrophy. Left ventricular diastolic parameters are consistent with Grade II diastolic dysfunction (pseudonormalization). Elevated left atrial pressure. Right Ventricle: The right ventricular size is normal. No increase in right ventricular wall thickness. Right ventricular systolic function is normal. Left Atrium: Left atrial size was moderately dilated. Right Atrium: Right atrial size was normal in size. Pericardium: There is no evidence of pericardial effusion. Mitral Valve: The mitral valve is grossly normal. There is mild thickening of the mitral valve leaflet(s). There is mild calcification of the mitral valve leaflet(s). Moderate mitral annular calcification. Mild mitral valve regurgitation. Tricuspid Valve: The tricuspid valve is normal in structure. Tricuspid valve regurgitation is trivial. Aortic Valve: The aortic valve has been repaired/replaced. Aortic valve mean gradient measures 11.0 mmHg. Aortic valve peak gradient  measures 17.5 mmHg. Aortic valve area, by VTI measures 1.41 cm. There is a 29 mm Edwards Ultra, stented (TAVR) valve present in the aortic position. Echo findings are consistent with normal structure and function of the aortic valve prosthesis. Pulmonic Valve: The pulmonic valve was normal in structure. Pulmonic valve regurgitation is trivial. Aorta: Aortic dilatation noted. There is mild dilatation of the ascending aorta, measuring 42 mm. Venous: The inferior vena cava is normal in size with greater than 50% respiratory variability, suggesting right atrial pressure of 3 mmHg. IAS/Shunts: The atrial septum is grossly normal.  LEFT VENTRICLE PLAX 2D LVIDd:         5.00 cm      Diastology LVIDs:         3.30 cm      LV e' medial:    4.29 cm/s LV PW:         1.50 cm      LV E/e' medial:  26.1 LV IVS:        1.40 cm      LV e' lateral:   5.87 cm/s LVOT diam:     2.00 cm      LV E/e' lateral: 19.1 LV SV:         61 LV SV Index:   28 LVOT Area:     3.14 cm  LV Volumes (MOD) LV vol d, MOD A2C: 208.0 ml LV vol d, MOD A4C: 250.0 ml LV vol s, MOD A2C: 110.0 ml LV vol s, MOD A4C: 137.0 ml LV SV MOD A2C:     98.0 ml LV SV MOD A4C:     250.0 ml LV SV MOD BP:      106.9 ml RIGHT VENTRICLE  IVC RV Basal diam:  3.80 cm     IVC diam: 1.80 cm RV Mid diam:    2.90 cm RV S prime:     12.40 cm/s TAPSE (M-mode): 2.9 cm LEFT ATRIUM             Index        RIGHT ATRIUM           Index LA diam:        4.70 cm 2.17 cm/m   RA Area:     19.30 cm LA Vol (A2C):   71.9 ml 33.19 ml/m  RA Volume:   52.60 ml  24.28 ml/m LA Vol (A4C):   84.7 ml 39.10 ml/m LA Biplane Vol: 78.3 ml 36.14 ml/m  AORTIC VALVE AV Area (Vmax):    1.53 cm AV Area (Vmean):   1.47 cm AV Area (VTI):     1.41 cm AV Vmax:           209.00 cm/s AV Vmean:          160.500 cm/s AV VTI:            0.432 m AV Peak Grad:      17.5 mmHg AV Mean Grad:      11.0 mmHg LVOT Vmax:         101.60 cm/s LVOT Vmean:        75.150 cm/s LVOT VTI:          0.194 m LVOT/AV VTI  ratio: 0.45  AORTA Ao Root diam: 3.90 cm Ao Asc diam:  4.00 cm MITRAL VALVE MV Area (PHT): 5.09 cm     SHUNTS MV Decel Time: 149 msec     Systemic VTI:  0.19 m MV E velocity: 112.00 cm/s  Systemic Diam: 2.00 cm MV A velocity: 96.10 cm/s MV E/A ratio:  1.17 Gwyndolyn Kaufman MD Electronically signed by Gwyndolyn Kaufman MD Signature Date/Time: 01/28/2021/12:22:54 PM    Final    ECHOCARDIOGRAM LIMITED  Result Date: 01/27/2021    ECHOCARDIOGRAM LIMITED REPORT   Patient Name:   Jordan Oconnell Date of Exam: 01/27/2021 Medical Rec #:  [id number]         Height:       73.0 in Accession #:    [id number]        Weight:       203.0 lb Date of Birth:  Jan 31, 1940         BSA:          2.165 m Patient Age:    89 years          BP:           118/67 mmHg Patient Gender: M                 HR:           67 bpm. Exam Location:  Inpatient Procedure: Limited Echo, Color Doppler and Cardiac Doppler Indications:     Aortic Stenosis i35.0  History:         Patient has prior history of Echocardiogram examinations.  Sonographer:     Raquel Sarna Senior RDCS Referring Phys:  [place] Diagnosing Phys: Jenkins Rouge MD  Sonographer Comments: 39mm Edwards Sapien 3 Ultra TAVR Implanted IMPRESSIONS  1. Left ventricular ejection fraction, by estimation, is 50 to 55%. The left ventricle has low normal function. The left ventricle demonstrates global hypokinesis. The left ventricular internal cavity size was mildly dilated. There is moderate left ventricular  hypertrophy. Left ventricular diastolic function could not be evaluated.  2. The mitral valve is abnormal. Trivial mitral valve regurgitation. Moderate mitral annular calcification.  3. Pre TAVR : Tri leaflet AV with severe stentosis and calcification mean gradient supine in cath lab 35 peak 61 mmHg AVA 0.56 cm2 trivial AR         Post TAVR: well seated 29 mm Sapian 3 Ultra valve with no PVL mean gradient 3 peak gradient 6 mmHg AVA 2.2 cm2 Suboptimal apical images . The aortic valve has  been repaired/replaced. Aortic valve regurgitation is not visualized. FINDINGS  Left Ventricle: Left ventricular ejection fraction, by estimation, is 50 to 55%. The left ventricle has low normal function. The left ventricle demonstrates global hypokinesis. The left ventricular internal cavity size was mildly dilated. There is moderate left ventricular hypertrophy. Left ventricular diastolic function could not be evaluated. Left Atrium: Left atrial size was not assessed. Right Atrium: Right atrial size was not assessed. Pericardium: There is no evidence of pericardial effusion. Mitral Valve: The mitral valve is abnormal. There is mild thickening of the mitral valve leaflet(s). There is mild calcification of the mitral valve leaflet(s). Moderate mitral annular calcification. Trivial mitral valve regurgitation. Tricuspid Valve: The tricuspid valve is not assessed. Aortic Valve: Pre TAVR : Tri leaflet AV with severe stentosis and calcification mean gradient supine in cath lab 35 peak 61 mmHg AVA 0.56 cm2 trivial AR Post TAVR: well seated 29 mm Sapian 3 Ultra valve with no PVL mean gradient 3 peak gradient 6 mmHg AVA 2.2 cm2 Suboptimal apical images. The aortic valve has been repaired/replaced. Aortic valve regurgitation is not visualized. Aortic valve mean gradient  measures 3.0 mmHg. Aortic valve peak gradient measures 5.8 mmHg. Aortic valve area, by VTI measures 2.16 cm. Pulmonic Valve: The pulmonic valve was not assessed. IAS/Shunts: The interatrial septum was not assessed. LEFT VENTRICLE PLAX 2D LVOT diam:     2.10 cm LV SV:         57 LV SV Index:   26 LVOT Area:     3.46 cm  AORTIC VALVE AV Area (Vmax):    2.16 cm AV Area (Vmean):   2.13 cm AV Area (VTI):     2.16 cm AV Vmax:           120.00 cm/s AV Vmean:          88.100 cm/s AV VTI:            0.265 m AV Peak Grad:      5.8 mmHg AV Mean Grad:      3.0 mmHg LVOT Vmax:         75.00 cm/s LVOT Vmean:        54.300 cm/s LVOT VTI:          0.165 m LVOT/AV VTI  ratio: 0.62  SHUNTS Systemic VTI:  0.16 m Systemic Diam: 2.10 cm Jenkins Rouge MD Electronically signed by Jenkins Rouge MD Signature Date/Time: 01/27/2021/12:05:23 PM    Final    Structural Heart Procedure  Result Date: 01/27/2021 See surgical note for result.  Disposition   Pt is being discharged home today in good condition.  Follow-up Plans & Appointments     Follow-up Information     Eileen Stanford, PA-C. Go on 02/06/2021.   Specialties: Cardiology, Radiology Why: @ 12 pm, please arrive at least 10 minutes early. Contact information: [address] [place] [address] [phone]  Discharge Instructions     Amb Referral to Cardiac Rehabilitation   Complete by: As directed    Diagnosis: Valve Replacement   Valve: Aortic Comment - TAVR   After initial evaluation and assessments completed: Virtual Based Care may be provided alone or in conjunction with Phase 2 Cardiac Rehab based on patient barriers.: Yes       Discharge Medications   Allergies as of 01/29/2021       Reactions   Amoxicillin Other (See Comments)   Acute interstitial nephritis   Doxycycline Hives   Flomax [tamsulosin Hcl] Other (See Comments)   Dizzy        Medication List     STOP taking these medications    ciprofloxacin 500 MG tablet Commonly known as: Cipro   flecainide 50 MG tablet Commonly known as: TAMBOCOR       TAKE these medications    ALPRAZolam 0.25 MG tablet Commonly known as: XANAX Take 0.25 mg by mouth at bedtime.   aspirin 81 MG chewable tablet Chew 1 tablet (81 mg total) by mouth daily. Start taking on: January 30, 2021   diltiazem 180 MG 24 hr capsule Commonly known as: CARDIZEM CD Take 180 mg by mouth daily.   escitalopram 10 MG tablet Commonly known as: LEXAPRO Take 10 mg by mouth every morning.   hydrALAZINE 50 MG tablet Commonly known as: APRESOLINE Take 50 mg by mouth 2 (two) times daily.   latanoprost 0.005 %  ophthalmic solution Commonly known as: XALATAN Place 1 drop into both eyes at bedtime.   mirtazapine 15 MG tablet Commonly known as: REMERON Take 7.5 mg by mouth at bedtime.   omeprazole 20 MG capsule Commonly known as: PRILOSEC Take 2 capsules (40 mg total) by mouth daily. What changed: how much to take   sodium bicarbonate 650 MG tablet Take 1,300 mg by mouth 2 (two) times daily.        Outstanding Labs/Studies   CBC  Duration of Discharge Encounter   Greater than 30 minutes including physician time.  SignedAngelena Form, PA-C 01/29/2021, 1:57 PM [phone]

## 2021-01-29 NOTE — Plan of Care (Signed)
  Problem: Clinical Measurements: Goal: Will remain free from infection Outcome: Progressing Goal: Diagnostic test results will improve Outcome: Progressing Goal: Respiratory complications will improve Outcome: Progressing Goal: Cardiovascular complication will be avoided Outcome: Progressing   

## 2021-01-29 NOTE — Progress Notes (Signed)
Pt sts he was able to walk 900 ft earlier without difficulty, HR controlled per RN. Reviewed walking at home. Wann CES, ACSM 2:43 PM 01/29/2021

## 2021-01-29 NOTE — Telephone Encounter (Signed)
See note about a fib.

## 2021-01-29 NOTE — Progress Notes (Addendum)
[place] VALVE TEAM  Patient Name: Jordan Oconnell Date of Encounter: 01/29/2021  Admit date: 01/27/2021  Primary Care Provider: Burnard Bunting, MD [facility] Cardiologist: Sanda Klein, MD / Dr. Burt Knack & Dr. Cyndia Bent (TAVR) [facility] Electrophysiologist:  None   Hospital Problem List     Principal Problem:   S/P TAVR (transcatheter aortic valve replacement) Active Problems:   Urothelial cancer ([place])   Essential hypertension   PAF (paroxysmal atrial fibrillation) (HCC)   Bladder cancer ([place])   Protein-calorie malnutrition, severe (HCC)   CKD (chronic kidney disease) stage 4, GFR 15-29 ml/min (HCC)   Severe aortic stenosis     Subjective   Feeling well aside from not getting any sleep last night. Does not feel palpitations. No dizziness or syncope. Walked halls with no issues  Inpatient Medications    Scheduled Meds:  ALPRAZolam  0.25 mg Oral QHS   aspirin  81 mg Oral Daily   diltiazem  180 mg Oral Daily   escitalopram  10 mg Oral q morning   flecainide  25 mg Oral Q12H   hydrALAZINE  50 mg Oral BID   mirtazapine  7.5 mg Oral QHS   pantoprazole  40 mg Oral Daily   sodium bicarbonate  1,300 mg Oral BID   sodium chloride flush  3 mL Intravenous Q12H   Continuous Infusions:  sodium chloride 500 mL/hr at 01/27/21 1134   sodium chloride     sodium chloride     nitroGLYCERIN     PRN Meds: sodium chloride, acetaminophen **OR** acetaminophen, morphine injection, ondansetron (ZOFRAN) IV, oxyCODONE, sodium chloride flush, traMADol   Vital Signs    Vitals:   01/28/21 2341 01/29/21 0357 01/29/21 0502 01/29/21 0853  BP: 108/61 140/76  (!) 163/80  Pulse: 79 74  70  Resp: 16 20 20 19   Temp: 98.5 F (36.9 C) 98.6 F (37 C)  97.9 F (36.6 C)  TempSrc: Oral Oral  Oral  SpO2: 96% 95%  97%  Weight:   91.3 kg   Height:        Intake/Output Summary (Last 24 hours) at 01/29/2021 0942 Last data filed at 01/29/2021  0854 Gross per 24 hour  Intake 480 ml  Output 2625 ml  Net -2145 ml   Filed Weights   01/27/21 0820 01/28/21 0500 01/29/21 0502  Weight: 92.1 kg 92.2 kg 91.3 kg    Physical Exam    GEN: Well nourished, well developed, in no acute distress.  HEENT: Grossly normal.  Neck: Supple, no JVD or masses. Cardiac: irreg irreg. no murmurs, rubs, or gallops. No clubbing, cyanosis, edema.   Respiratory:  Respirations regular and unlabored, clear to auscultation bilaterally. GI: Soft, nontender, nondistended, BS + x 4. MS: no deformity or atrophy. Skin: warm and dry, no rash.  Groin sites clear without hematoma or ecchymosis. Urostomy bag in place Neuro:  Strength and sensation are intact. Psych: AAOx3.  Normal affect.  Labs    CBC Recent Labs    01/28/21 0432 01/29/21 0429  WBC 6.6 6.3  HGB 12.5* 11.9*  HCT 39.7 37.1*  MCV 95.0 94.2  PLT PLATELET CLUMPS NOTED ON SMEAR, UNABLE TO ESTIMATE 79*   Basic Metabolic Panel Recent Labs    01/28/21 0432 01/29/21 0429  NA 136 136  K 4.3 4.2  CL 111 110  CO2 17* 18*  GLUCOSE 94 102*  BUN 43* 40*  CREATININE 2.35* 2.27*  CALCIUM 8.6* 8.5*  MG 1.8  --  Liver Function Tests No results for input(s): AST, ALT, ALKPHOS, BILITOT, PROT, ALBUMIN in the last 72 hours. No results for input(s): LIPASE, AMYLASE in the last 72 hours. Cardiac Enzymes No results for input(s): CKTOTAL, CKMB, CKMBINDEX, TROPONINI in the last 72 hours. BNP Invalid input(s): POCBNP D-Dimer No results for input(s): DDIMER in the last 72 hours. Hemoglobin A1C No results for input(s): HGBA1C in the last 72 hours. Fasting Lipid Panel No results for input(s): CHOL, HDL, LDLCALC, TRIG, CHOLHDL, LDLDIRECT in the last 72 hours. Thyroid Function Tests No results for input(s): TSH, T4TOTAL, T3FREE, THYROIDAB in the last 72 hours.  Invalid input(s): FREET3  Telemetry    Afib/flutter with CVR and intermittent pauses up to 3-4 seconds- Personally Reviewed  ECG     Afib with LBBB, HR 68 - Personally Reviewed  Radiology    ECHOCARDIOGRAM COMPLETE  Result Date: 01/28/2021    ECHOCARDIOGRAM REPORT   Patient Name:   Jordan Oconnell Date of Exam: 01/28/2021 Medical Rec #:  [id number]         Height:       73.0 in Accession #:    [id number]        Weight:       203.3 lb Date of Birth:  06/21/1940         BSA:          2.166 m Patient Age:    81 years          BP:           164/79 mmHg Patient Gender: M                 HR:           74 bpm. Exam Location:  Inpatient Procedure: 2D Echo, Cardiac Doppler and Color Doppler Indications:    S/p TAVR  History:        Patient has prior history of Echocardiogram examinations.                 Arrythmias:Atrial Fibrillation; Risk Factors:Hypertension.                 Aortic Valve: 29 mm Edwards Ultra, stented (TAVR) valve is                 present in the aortic position.  Sonographer:    Jyl Heinz Referring Phys: [id number] [place]  1. The aortic valve has been repaired/replaced. There is a 29 mm Edwards Ultra, stented (TAVR) valve present in the aortic position. Echo findings are consistent with normal structure and function of the aortic valve prosthesis. Mean gradient 39mmHg, Vmax 2.0 m/s, DI 0.52. There is no paravalvular leak.  2. Left ventricular ejection fraction, by estimation, is 50 to 55%. The left ventricle has low normal function. The left ventricle has no regional wall motion abnormalities. There is moderate concentric left ventricular hypertrophy. Left ventricular diastolic parameters are consistent with Grade II diastolic dysfunction (pseudonormalization). Elevated left atrial pressure.  3. Right ventricular systolic function is normal. The right ventricular size is normal.  4. Left atrial size was moderately dilated.  5. The mitral valve is grossly normal. Mild mitral valve regurgitation. Moderate mitral annular calcification.  6. Aortic dilatation noted. There is mild dilatation of the ascending  aorta, measuring 42 mm.  7. The inferior vena cava is normal in size with greater than 50% respiratory variability, suggesting right atrial pressure of 3 mmHg. FINDINGS  Left Ventricle: Left ventricular ejection  fraction, by estimation, is 50 to 55%. The left ventricle has low normal function. The left ventricle has no regional wall motion abnormalities. The left ventricular internal cavity size was normal in size. There is moderate concentric left ventricular hypertrophy. Left ventricular diastolic parameters are consistent with Grade II diastolic dysfunction (pseudonormalization). Elevated left atrial pressure. Right Ventricle: The right ventricular size is normal. No increase in right ventricular wall thickness. Right ventricular systolic function is normal. Left Atrium: Left atrial size was moderately dilated. Right Atrium: Right atrial size was normal in size. Pericardium: There is no evidence of pericardial effusion. Mitral Valve: The mitral valve is grossly normal. There is mild thickening of the mitral valve leaflet(s). There is mild calcification of the mitral valve leaflet(s). Moderate mitral annular calcification. Mild mitral valve regurgitation. Tricuspid Valve: The tricuspid valve is normal in structure. Tricuspid valve regurgitation is trivial. Aortic Valve: The aortic valve has been repaired/replaced. Aortic valve mean gradient measures 11.0 mmHg. Aortic valve peak gradient measures 17.5 mmHg. Aortic valve area, by VTI measures 1.41 cm. There is a 29 mm Edwards Ultra, stented (TAVR) valve present in the aortic position. Echo findings are consistent with normal structure and function of the aortic valve prosthesis. Pulmonic Valve: The pulmonic valve was normal in structure. Pulmonic valve regurgitation is trivial. Aorta: Aortic dilatation noted. There is mild dilatation of the ascending aorta, measuring 42 mm. Venous: The inferior vena cava is normal in size with greater than 50% respiratory  variability, suggesting right atrial pressure of 3 mmHg. IAS/Shunts: The atrial septum is grossly normal.  LEFT VENTRICLE PLAX 2D LVIDd:         5.00 cm      Diastology LVIDs:         3.30 cm      LV e' medial:    4.29 cm/s LV PW:         1.50 cm      LV E/e' medial:  26.1 LV IVS:        1.40 cm      LV e' lateral:   5.87 cm/s LVOT diam:     2.00 cm      LV E/e' lateral: 19.1 LV SV:         61 LV SV Index:   28 LVOT Area:     3.14 cm  LV Volumes (MOD) LV vol d, MOD A2C: 208.0 ml LV vol d, MOD A4C: 250.0 ml LV vol s, MOD A2C: 110.0 ml LV vol s, MOD A4C: 137.0 ml LV SV MOD A2C:     98.0 ml LV SV MOD A4C:     250.0 ml LV SV MOD BP:      106.9 ml RIGHT VENTRICLE             IVC RV Basal diam:  3.80 cm     IVC diam: 1.80 cm RV Mid diam:    2.90 cm RV S prime:     12.40 cm/s TAPSE (M-mode): 2.9 cm LEFT ATRIUM             Index        RIGHT ATRIUM           Index LA diam:        4.70 cm 2.17 cm/m   RA Area:     19.30 cm LA Vol (A2C):   71.9 ml 33.19 ml/m  RA Volume:   52.60 ml  24.28 ml/m LA Vol (A4C):   84.7 ml 39.10 ml/m  LA Biplane Vol: 78.3 ml 36.14 ml/m  AORTIC VALVE AV Area (Vmax):    1.53 cm AV Area (Vmean):   1.47 cm AV Area (VTI):     1.41 cm AV Vmax:           209.00 cm/s AV Vmean:          160.500 cm/s AV VTI:            0.432 m AV Peak Grad:      17.5 mmHg AV Mean Grad:      11.0 mmHg LVOT Vmax:         101.60 cm/s LVOT Vmean:        75.150 cm/s LVOT VTI:          0.194 m LVOT/AV VTI ratio: 0.45  AORTA Ao Root diam: 3.90 cm Ao Asc diam:  4.00 cm MITRAL VALVE MV Area (PHT): 5.09 cm     SHUNTS MV Decel Time: 149 msec     Systemic VTI:  0.19 m MV E velocity: 112.00 cm/s  Systemic Diam: 2.00 cm MV A velocity: 96.10 cm/s MV E/A ratio:  1.17 Gwyndolyn Kaufman MD Electronically signed by Gwyndolyn Kaufman MD Signature Date/Time: 01/28/2021/12:22:54 PM    Final    ECHOCARDIOGRAM LIMITED  Result Date: 01/27/2021    ECHOCARDIOGRAM LIMITED REPORT   Patient Name:   Jordan Oconnell Date of Exam: 01/27/2021  Medical Rec #:  [id number]         Height:       73.0 in Accession #:    [id number]        Weight:       203.0 lb Date of Birth:  December 03, 1940         BSA:          2.165 m Patient Age:    35 years          BP:           118/67 mmHg Patient Gender: M                 HR:           67 bpm. Exam Location:  Inpatient Procedure: Limited Echo, Color Doppler and Cardiac Doppler Indications:     Aortic Stenosis i35.0  History:         Patient has prior history of Echocardiogram examinations.  Sonographer:     Raquel Sarna Senior RDCS Referring Phys:  [place] Diagnosing Phys: Jenkins Rouge MD  Sonographer Comments: 53mm Edwards Sapien 3 Ultra TAVR Implanted IMPRESSIONS  1. Left ventricular ejection fraction, by estimation, is 50 to 55%. The left ventricle has low normal function. The left ventricle demonstrates global hypokinesis. The left ventricular internal cavity size was mildly dilated. There is moderate left ventricular hypertrophy. Left ventricular diastolic function could not be evaluated.  2. The mitral valve is abnormal. Trivial mitral valve regurgitation. Moderate mitral annular calcification.  3. Pre TAVR : Tri leaflet AV with severe stentosis and calcification mean gradient supine in cath lab 35 peak 61 mmHg AVA 0.56 cm2 trivial AR         Post TAVR: well seated 29 mm Sapian 3 Ultra valve with no PVL mean gradient 3 peak gradient 6 mmHg AVA 2.2 cm2 Suboptimal apical images . The aortic valve has been repaired/replaced. Aortic valve regurgitation is not visualized. FINDINGS  Left Ventricle: Left ventricular ejection fraction, by estimation, is 50 to 55%. The left ventricle has low normal function. The left  ventricle demonstrates global hypokinesis. The left ventricular internal cavity size was mildly dilated. There is moderate left ventricular hypertrophy. Left ventricular diastolic function could not be evaluated. Left Atrium: Left atrial size was not assessed. Right Atrium: Right atrial size was not assessed.  Pericardium: There is no evidence of pericardial effusion. Mitral Valve: The mitral valve is abnormal. There is mild thickening of the mitral valve leaflet(s). There is mild calcification of the mitral valve leaflet(s). Moderate mitral annular calcification. Trivial mitral valve regurgitation. Tricuspid Valve: The tricuspid valve is not assessed. Aortic Valve: Pre TAVR : Tri leaflet AV with severe stentosis and calcification mean gradient supine in cath lab 35 peak 61 mmHg AVA 0.56 cm2 trivial AR Post TAVR: well seated 29 mm Sapian 3 Ultra valve with no PVL mean gradient 3 peak gradient 6 mmHg AVA 2.2 cm2 Suboptimal apical images. The aortic valve has been repaired/replaced. Aortic valve regurgitation is not visualized. Aortic valve mean gradient  measures 3.0 mmHg. Aortic valve peak gradient measures 5.8 mmHg. Aortic valve area, by VTI measures 2.16 cm. Pulmonic Valve: The pulmonic valve was not assessed. IAS/Shunts: The interatrial septum was not assessed. LEFT VENTRICLE PLAX 2D LVOT diam:     2.10 cm LV SV:         57 LV SV Index:   26 LVOT Area:     3.46 cm  AORTIC VALVE AV Area (Vmax):    2.16 cm AV Area (Vmean):   2.13 cm AV Area (VTI):     2.16 cm AV Vmax:           120.00 cm/s AV Vmean:          88.100 cm/s AV VTI:            0.265 m AV Peak Grad:      5.8 mmHg AV Mean Grad:      3.0 mmHg LVOT Vmax:         75.00 cm/s LVOT Vmean:        54.300 cm/s LVOT VTI:          0.165 m LVOT/AV VTI ratio: 0.62  SHUNTS Systemic VTI:  0.16 m Systemic Diam: 2.10 cm Jenkins Rouge MD Electronically signed by Jenkins Rouge MD Signature Date/Time: 01/27/2021/12:05:23 PM    Final    Structural Heart Procedure  Result Date: 01/27/2021 See surgical note for result.   Cardiac Studies   TAVR OPERATIVE NOTE     Date of Procedure:                01/27/2021   Preoperative Diagnosis:      Severe Aortic Stenosis    Postoperative Diagnosis:    Same    Procedure:        Transcatheter Aortic Valve Replacement -  Percutaneous Right Transfemoral Approach             Edwards Sapien 3 Ultra Resilia THV (size 29 mm, model # 9755RSL, serial # [id number])              Co-Surgeons:                        Gaye Pollack, MD and Sherren Mocha, MD       Anesthesiologist:                  Jenita Seashore, MD   Echocardiographer:              Edmonia James, MD  Pre-operative Echo Findings: Severe aortic stenosis Normal left ventricular systolic function   Post-operative Echo Findings: No paravalvular leak Normal left ventricular systolic function  _____________________  Echo 01/28/21:  IMPRESSIONS   1. The aortic valve has been repaired/replaced. There is a 29 mm Edwards  Ultra, stented (TAVR) valve present in the aortic position. Echo findings  are consistent with normal structure and function of the aortic valve  prosthesis. Mean gradient 29mmHg,  Vmax 2.0 m/s, DI 0.52. There is no paravalvular leak.   2. Left ventricular ejection fraction, by estimation, is 50 to 55%. The  left ventricle has low normal function. The left ventricle has no regional  wall motion abnormalities. There is moderate concentric left ventricular  hypertrophy. Left ventricular  diastolic parameters are consistent with Grade II diastolic dysfunction  (pseudonormalization). Elevated left atrial pressure.   3. Right ventricular systolic function is normal. The right ventricular  size is normal.   4. Left atrial size was moderately dilated.   5. The mitral valve is grossly normal. Mild mitral valve regurgitation.  Moderate mitral annular calcification.   6. Aortic dilatation noted. There is mild dilatation of the ascending  aorta, measuring 42 mm.   7. The inferior vena cava is normal in size with greater than 50%  respiratory variability, suggesting right atrial pressure of 3 mmHg.  Patient Profile     Jordan Oconnell is a 81 y.o. male with a history of of extensive urothelial cancer history s/p cystectomy, bilateral  ureterectomy in 2015 with ileal conduit followed by left nephrectomy in 2018, paroxysmal atrial fibrillation on Flecanide and diltiazem (not anticoagulated given recurrent hematuria), chronic hyperchloremic metabolic acidosis treated with sodium bicarbonate tablets, CKD stage IV, HTN, HLD and severe aortic stenosis who presented to [facility] on 01/27/21 for planned TAVR.   Assessment & Plan    Severe AS: s/p successful TAVR with a 29 mm Edwards Sapien 3 Ultra Resilia THV via the TF approach on 01/27/21. Post operative echo showed EF 50-55%, normally functioning TAVR with a mean gradient of 10 mmHg and no PVL. Groin sites are stable. ECG with sinus with old 1st deg block but new LBBB. Continue Asprin alone.   PAF: he has been on flecainide and diltiazem in the outpatient setting. No OAC given ongoing issues with hematuria. He is now back in afib/flutter. Also have some pauses up to ~3-4 seconds. All of this is asymptomatic. Given underlying conduction disease with 1st deg AV block, new LBBB after TAVR, recurrent afib with pauses, will ask EP to consult for recommendations. If EP sees and clears, potential discharge home this afternoon with Zio Patch. Could potentially be a Watchman candidate in the future, but needs to stay of [place] until urologic biopsy completed.   CAD: pre TAVR cath showed nonobstructive coronary artery disease with separate LAD and left circumflex ostia, patent LAD with mild nonobstructive plaquing, severe ostial diagonal stenoses, patent left circumflex with moderate intermediate branch stenosis and no other significant stenoses, patent nondominant RCA. Continue medical therapy.   HTN: BP has been a little elevated. Resumed on home meds. Can increase home hydralazine if remains high  Chronic hyperchloremic metabolic acidosis: continue home sodium bicarbonate tablets  CKD stage IV: creat 2.27 with a GFR of 28. This has remained stable.   Urologic disease: he has a history of urothelial cancer  history s/p cystectomy, bilateral ureterectomy in 2015 with ileal conduit followed by left nephrectomy in 2018. Pre TAVR Ct scans showed a urothelial mass in the  right renal pelvis, with additional areas of soft tissue thickening and enhancement within the right ileal conduit, concerning for recurrent urothelial neoplasm. He is closely followed by Dr. [name] with [facility] who plans for biopsy after TAVR.  Mable Fill, PA-C  01/29/2021, 9:42 AM  Pager [phone]  Patient seen, examined. Available data reviewed. Agree with findings, assessment, and plan as outlined by Nell Range, PA-C. Pt seen early am of 1/5. Alert, oriented, NAD. HEENT nl, lungs CTA, heart irregularly irregular with very soft SEM at the RUSB, abd: soft, NT, ext: no edema, BL groin sites clear. Echo reviewed and shwos normal TAVR valve function with mean gradient 10 mmHg and no PVL. Tele shows atrial fibrillation, intermittent pauses, and one episode of AF with RVR lasting approximately 4 minutes. With the patient's new LBBB, atrial fibrillation, and other issues above, will request EP consultation. At a minimum, the patient will be discharged on a live telemetry monitor.   Sherren Mocha, M.D. 01/30/2021 5:51 AM

## 2021-01-29 NOTE — Consult Note (Addendum)
Cardiology Consultation:   Patient ID: Jordan Oconnell MRN: [id number]; DOB: April 29, 1940  Admit date: 01/27/2021 Date of Consult: 01/29/2021  PCP:  Burnard Bunting, MD   [facility] Providers Cardiologist:  Sanda Klein, MD    Patient Profile:   Jordan Oconnell is a 81 y.o. male with a hx of  multiple sites of urothelial cancer involving his bladder, ureters and renal pelvises bilaterally as well as renal cell carcinoma (history of partial left nephrectomy and complete cystectomy with urostomy), CKD (IV, has chronic hyperchloremic metabolic acidosis treated with sodium bicarbonate tablets), HTN, HLD, VHD w/severe AS and Afib (not on a/c 2/2 recurrent hematuria, who is being seen 01/29/2021 for the evaluation of new LBBB and some pauses on telemetry post TAVR at the request of Dr. Burt Knack.  History of Present Illness:   Jordan Oconnell was admitted 01/27/21 to have TAVR procedure.  Post op has a new LBBB and planned to monitor further, he has been found to have PAFib with some pauses, most are < 3 seconds, has had 2 longer , 3.6 and 3.9 seconds, looks like most if not all are post conversion pauses, all asymptomatic.  Pre-hospitalization, pre-TAVR he had 1st degree AVblock, LAD was on flecainide and dilt for rate/rhythm control Post TAVR with 1st degree AVblock, broad LBBB QRS 224ms  Post TAVR echo yesterday LVEF remains preserved, 50-55%, no WMA, valve functioning well.  LABS K+ 4.2 BUN/Creat 40/2.27 (his baseline) WBC 6.3 H/H 11/37 Plts 79  He feels quite well, no CP, palpitations or any cardiac awareness, no SOB Has been ambulating in the hall without difficulty, no dizziness, near syncope or syncope, here or home  Past Medical History:  Diagnosis Date   Bladder cancer ([place])    BPH (benign prostatic hyperplasia)    CKD (chronic kidney disease), stage IV (HCC)    Colon polyps    Diverticulosis    GERD (gastroesophageal reflux disease)    Hyperlipidemia    pt denies    Hypertension    PAF (paroxysmal atrial fibrillation) ([place])    on Flecanide and diltiazem. 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 Burt Knack and Dr. Cyndia Bent   Severe aortic stenosis 10/05/2020    Past Surgical History:  Procedure Laterality Date   APPENDECTOMY     BLADDER SURGERY  2008   transurethral resection/resection of prostatic urethra   BOWEL RESECTION     ESOPHAGOGASTRODUODENOSCOPY N/A 07/24/2013   Procedure: ESOPHAGOGASTRODUODENOSCOPY (EGD);  Surgeon: [name], MD;  Location: [name];  Service: Endoscopy;  Laterality: N/A;   INTRAOPERATIVE TRANSTHORACIC ECHOCARDIOGRAM N/A 01/27/2021   Procedure: INTRAOPERATIVE TRANSTHORACIC ECHOCARDIOGRAM;  Surgeon: Sherren Mocha, MD;  Location: [place] [facility];  Service: Open Heart Surgery;  Laterality: N/A;   laparoscopic surgery  2012   laser   (?)   NEPHRECTOMY  2007   partial, left   NEPHROSTOMY  2011   stent   NM MYOCAR PERF WALL MOTION  09/08/2010   Normal   RIGHT HEART CATH AND CORONARY ANGIOGRAPHY N/A 10/06/2020   Procedure: RIGHT HEART CATH AND CORONARY ANGIOGRAPHY;  Surgeon: Sherren Mocha, MD;  Location: [place] [facility];  Service: Cardiovascular;  Laterality: N/A;   ROBOT ASSISTED LAPAROSCOPIC COMPLETE CYSTECT ILEAL CONDUIT     TRANSCATHETER AORTIC VALVE REPLACEMENT, TRANSFEMORAL Right 01/27/2021   Procedure:  TRANSCATHETER AORTIC VALVE REPLACEMENT, TRANSFEMORAL;  Surgeon: Sherren Mocha, MD;  Location: [place] [facility];  Service: Open Heart Surgery;  Laterality: Right;   US ECHOCARDIOGRAPHY  2/95/2841   mild diastolic dysfunction,mild dilated LA,mild MR,AI,mildly dilated aortic root     Home Medications:  Prior to Admission medications   Medication Sig Start Date End Date Taking? Authorizing Provider  ALPRAZolam (XANAX) 0.25 MG tablet Take 0.25 mg by mouth  at bedtime.   Yes [provider]  ciprofloxacin (CIPRO) 500 MG tablet Take 1 tablet (500 mg total) by mouth 2 (two) times daily. 01/23/21  Yes Kathyrn Drown D, NP  diltiazem (CARDIZEM CD) 180 MG 24 hr capsule Take 180 mg by mouth daily. 09/29/20  Yes [provider]  escitalopram (LEXAPRO) 10 MG tablet Take 10 mg by mouth every morning. 11/08/12  Yes [provider]  flecainide (TAMBOCOR) 50 MG tablet Take 1/2 (one-half) tablet by mouth twice daily 11/17/20  Yes Croitoru, Mihai, MD  hydrALAZINE (APRESOLINE) 50 MG tablet Take 50 mg by mouth 2 (two) times daily.   Yes [provider]  latanoprost (XALATAN) 0.005 % ophthalmic solution Place 1 drop into both eyes at bedtime. 09/08/20  Yes [provider]  mirtazapine (REMERON) 15 MG tablet Take 7.5 mg by mouth at bedtime. 08/09/20  Yes [provider]  omeprazole (PRILOSEC) 20 MG capsule Take 2 capsules (40 mg total) by mouth daily. Patient taking differently: Take 20 mg by mouth daily. 07/24/13  Yes Donnajean Lopes, MD  sodium bicarbonate 650 MG tablet Take 1,300 mg by mouth 2 (two) times daily. 09/01/20  Yes [provider]    Inpatient Medications: Scheduled Meds:  ALPRAZolam  0.25 mg Oral QHS   aspirin  81 mg Oral Daily   diltiazem  180 mg Oral Daily   escitalopram  10 mg Oral q morning   flecainide  25 mg Oral Q12H   hydrALAZINE  50 mg Oral BID   mirtazapine  7.5 mg Oral QHS   pantoprazole  40 mg Oral Daily   sodium bicarbonate  1,300 mg Oral BID   sodium chloride flush  3 mL Intravenous Q12H   Continuous Infusions:  sodium chloride 500 mL/hr at 01/27/21 1134   sodium chloride     sodium chloride     nitroGLYCERIN     PRN Meds: sodium chloride, acetaminophen **OR** acetaminophen, morphine injection, ondansetron (ZOFRAN) IV, oxyCODONE, sodium chloride flush, traMADol  Allergies:    Allergies  Allergen Reactions   Amoxicillin Other (See Comments)    Acute interstitial  nephritis   Doxycycline Hives   Flomax [Tamsulosin Hcl] Other (See Comments)    Dizzy     Social History:   Social History   Socioeconomic History   Marital status: Divorced    Spouse name: Not on file   Number of children: 2   Years of education: Not on file   Highest education level: Not on file  Occupational History   Occupation: Pharmacist, hospital  Tobacco Use   Smoking status: Never   Smokeless tobacco: Never  Vaping Use   Vaping Use: Never used  Substance and Sexual Activity   Alcohol use: Yes    Comment: occasionally   Drug use: No   Sexual activity: Not on file  Other Topics Concern   Not on file  Social History Narrative   Not on file   Social Determinants of Health   Financial Resource Strain: Not on file  Food Insecurity: Not on file  Transportation Needs: Not on file  Physical Activity: Not on file  Stress: Not on file  Social Connections: Not on file  Intimate Partner Violence: Not on file    Family History:   Family History  Problem Relation Age of Onset   Ovarian cancer Mother    Colon cancer Mother    Colon cancer Father    Crohn's disease Son    Breast cancer Daughter    Colon cancer Brother      ROS:  Please see the history of present illness.  All other ROS reviewed and negative.     Physical Exam/Data:   Vitals:   01/28/21 2341 01/29/21 0357 01/29/21 0502 01/29/21 0853  BP: 108/61 140/76  (!) 163/80  Pulse: 79 74  70  Resp: 16 20 20 19   Temp: 98.5 F (36.9 C) 98.6 F (37 C)  97.9 F (36.6 C)  TempSrc: Oral Oral  Oral  SpO2: 96% 95%  97%  Weight:   91.3 kg   Height:        Intake/Output Summary (Last 24 hours) at 01/29/2021 1018 Last data filed at 01/29/2021 0854 Gross per 24 hour  Intake 480 ml  Output 2625 ml  Net -2145 ml   Last 3 Weights 01/29/2021 01/28/2021 01/27/2021  Weight (lbs) 201 lb 3.2 oz 203 lb 4.2 oz 203 lb 0.7 oz  Weight (kg) 91.264 kg 92.2 kg 92.1 kg     Body mass index is 26.55 kg/m.  General:  Well nourished,  well developed, in no acute distress HEENT: normal Neck: no JVD Vascular: No carotid bruits; Distal pulses 2+ bilaterally Cardiac: irreg-irreg; soft SM, no gallops or rubs Lungs:  CTA b/l, no wheezing, rhonchi or rales  Abd: soft, nontender  Ext: no edema Musculoskeletal:  No deformities Skin: warm and dry  Neuro:  no focal abnormalities noted Psych:  Normal affect   EKG:  The EKG was personally reviewed and demonstrates:   SB 50bpm, 1st degree AVblock 273ms, LBBB 174ms SR 75bpm, 1st degree Avblock 213ms, LBBB 114ms  AFib 68bpm, LBBB 166ms  OLD SR 86bpm, 1st degree Avblock 252ms, LAD, poor ant R progression  Telemetry:  Telemetry was personally reviewed and demonstrates:   SR 60's-70's generally, PAFib controlled rates with some beat to beat slowing (<3seconds) and some post termination pauses longest are 3.6 and 3.9seconds  Relevant CV Studies:  01/28/21: TTE IMPRESSIONS   1. The aortic valve has been repaired/replaced. There is a 29 mm Edwards  Ultra, stented (TAVR) valve present in the aortic position. Echo findings  are consistent with normal structure and function of the aortic valve  prosthesis. Mean gradient 66mmHg,  Vmax 2.0 m/s, DI 0.52. There is no paravalvular leak.   2. Left ventricular ejection fraction, by estimation, is 50 to 55%. The  left ventricle has low normal function. The left ventricle has no regional  wall motion abnormalities. There is moderate concentric left ventricular  hypertrophy. Left ventricular  diastolic parameters are consistent with Grade II diastolic dysfunction  (pseudonormalization). Elevated left atrial pressure.   3. Right ventricular systolic function is normal. The right ventricular  size is normal.   4. Left atrial size was moderately dilated.   5. The mitral valve is grossly normal. Mild mitral valve regurgitation.  Moderate mitral annular calcification.   6. Aortic dilatation noted. There is mild dilatation of the ascending   aorta, measuring 42 mm.   7. The inferior vena cava is normal in size with greater than 50%  respiratory variability, suggesting right atrial pressure of 3 mmHg.    10/06/20 RIGHT HEART CATH AND CORONARY ANGIOGRAPHY    Conclusion   Ramus lesion is 60% stenosed.   1st Diag lesion is 90% stenosed.   2nd Diag lesion is 50% stenosed.   Prox LAD to Mid LAD lesion is 30% stenosed.   Mid Cx lesion is 30% stenosed.   1.  Nonobstructive coronary artery disease with separate LAD and left circumflex ostia, patent LAD with mild nonobstructive plaquing, severe ostial diagonal stenoses, patent left circumflex with moderate intermediate branch stenosis and no other significant stenoses, patent nondominant RCA 2.  Known severe aortic stenosis with heavy calcification and restriction of the aortic valve leaflets seen on plain fluoroscopy 3.  Normal right heart hemodynamics with preserved cardiac output, normal pulmonary pressures, and normal LVEDP   Recommendation: 4 hours of postprocedural IV fluid, discharge from the hospital today, staged CTA scans for TAVR planning as an outpatient, cardiac surgical evaluation after CAT scans are done, medical therapy for coronary artery disease.  Laboratory Data:  High Sensitivity Troponin:  No results for input(s): TROPONINIHS in the last 720 hours.   Chemistry Recent Labs  Lab 01/23/21 1000 01/27/21 1026 01/27/21 1158 01/28/21 0432 01/29/21 0429  NA 137   < > 142 136 136  K 4.9   < > 4.3 4.3 4.2  CL 112*   < > 111 111 110  CO2 15*  --   --  17* 18*  GLUCOSE 136*   < > 128* 94 102*  BUN 39*   < > 43* 43* 40*  CREATININE 2.66*   < > 2.40* 2.35* 2.27*  CALCIUM 9.0  --   --  8.6* 8.5*  MG  --   --   --  1.8  --   GFRNONAA 23*  --   --  27* 28*  ANIONGAP 10  --   --  8 8   < > = values in this interval not displayed.    Recent Labs  Lab 01/23/21 1000  PROT 6.6  ALBUMIN 3.8  AST 24  ALT 13  ALKPHOS 42  BILITOT 0.8   Lipids No results for  input(s): CHOL, TRIG, HDL, LABVLDL, LDLCALC, CHOLHDL in the last 168 hours.  Hematology Recent Labs  Lab 01/23/21 1000 01/27/21 1026 01/27/21 1158 01/28/21 0432 01/29/21 0429  WBC 6.5  --   --  6.6 6.3  RBC 4.97  --   --  4.18* 3.94*  HGB 14.9   < > 11.9* 12.5* 11.9*  HCT 48.6   < > 35.0* 39.7 37.1*  MCV 97.8  --   --  95.0 94.2  MCH 30.0  --   --  29.9 30.2  MCHC 30.7  --   --  31.5 32.1  RDW 14.1  --   --  14.1 14.0  PLT 140*  --   --  PLATELET CLUMPS NOTED ON SMEAR, UNABLE TO ESTIMATE 79*   < > = values in this interval not displayed.   Thyroid No results for input(s): TSH, FREET4 in the last 168 hours.  BNP Recent Labs  Lab 01/23/21 1000  BNP 720.2*    DDimer No results for input(s): DDIMER in the last 168 hours.   Radiology/Studies:     Assessment and Plan:   Paroxysmal Afib CHA2DS2Vasc is 3 Not on a/c as discussed above New LBBB He has some baseline conduction system disease pre-TAVR as well  Flecainide is no longer  an AAD option  I think PPM is reasonable given baseline conduction disease and some post termination pauses >3 seconds with new LBBB, though he is asymptomatic, and not in a need to do situation either  PLTS are low today, 79  AAD options Tikosyn, though his renal function would not allow he is on Remeron and Lexapro   The patient is open to any/all options including pacing if felt to be the better option for him.  I have asked he not eat and Dr. [name] will see him later today     Risk Assessment/Risk Scores:     For questions or updates, please contact [facility] Please consult [email or web address] for contact info under    Signed, Baldwin Jamaica, PA-C  01/29/2021 10:18 AM

## 2021-01-30 ENCOUNTER — Telehealth: Payer: Self-pay | Admitting: Physician Assistant

## 2021-01-30 NOTE — Telephone Encounter (Signed)
°  [place] VALVE TEAM   Patient contacted regarding discharge from [facility] on 1/5  Patient understands to follow up with a structural heart APP on 1/13 at [facility].  Patient understands discharge instructions? yes Patient understands medications and regimen? yes Patient understands to bring all medications to this visit? yes  Angelena Form PA-C  [facility]

## 2021-01-30 NOTE — Telephone Encounter (Signed)
We know he has been having afib. No need to call him! Thank  you so much Jordan Oconnell

## 2021-02-02 ENCOUNTER — Telehealth: Payer: Self-pay | Admitting: Internal Medicine

## 2021-02-02 NOTE — Telephone Encounter (Signed)
Paged by [employer] and reported patient had a 5s-pause about 6:46pm today. I contacted the patient and he told me he was asymptomatic and in a shower at that time. Patient was recommended to contact his cardiologist tomorrow morning and go to ER tonight if he becomes symptomatic. Patient verbally understood and appreciated our call.

## 2021-02-03 ENCOUNTER — Encounter: Payer: Self-pay | Admitting: Cardiology

## 2021-02-03 ENCOUNTER — Other Ambulatory Visit: Payer: Self-pay

## 2021-02-03 ENCOUNTER — Telehealth: Payer: Self-pay | Admitting: Physician Assistant

## 2021-02-03 ENCOUNTER — Encounter: Payer: Self-pay | Admitting: *Deleted

## 2021-02-03 ENCOUNTER — Ambulatory Visit: Payer: Medicare PPO | Admitting: Cardiology

## 2021-02-03 VITALS — BP 138/84 | HR 76 | Ht 73.0 in | Wt 198.6 lb

## 2021-02-03 DIAGNOSIS — N184 Chronic kidney disease, stage 4 (severe): Secondary | ICD-10-CM

## 2021-02-03 DIAGNOSIS — I480 Paroxysmal atrial fibrillation: Secondary | ICD-10-CM | POA: Diagnosis not present

## 2021-02-03 DIAGNOSIS — I447 Left bundle-branch block, unspecified: Secondary | ICD-10-CM | POA: Diagnosis not present

## 2021-02-03 DIAGNOSIS — H35033 Hypertensive retinopathy, bilateral: Secondary | ICD-10-CM | POA: Diagnosis not present

## 2021-02-03 DIAGNOSIS — H40023 Open angle with borderline findings, high risk, bilateral: Secondary | ICD-10-CM | POA: Diagnosis not present

## 2021-02-03 DIAGNOSIS — Z952 Presence of prosthetic heart valve: Secondary | ICD-10-CM | POA: Diagnosis not present

## 2021-02-03 DIAGNOSIS — H5213 Myopia, bilateral: Secondary | ICD-10-CM | POA: Diagnosis not present

## 2021-02-03 DIAGNOSIS — H25813 Combined forms of age-related cataract, bilateral: Secondary | ICD-10-CM | POA: Diagnosis not present

## 2021-02-03 NOTE — Telephone Encounter (Signed)
°  [facility] V[facility] CENTER   MULTIDISCIPLINARY HEART VALVE TEAM   He has a history of PAF on Flecainide and diltiazem. He was admitted for TAVR last week and developed a new LBBB after TAVR and converted into afib with asymptomatic 3-4 second pauses. He was seen by Dr. [name] during admission who recommended stopping Flecainide and discharging with a live telemetry monitor.  Monitor showed afib with a 5 sec pause yesterday around 6pm while showering. He was completely asymptomatic. I advised that he stop taking his dilt today but he already took it his morning. He has an opthalmology apt today @ 12:30pm and some errands to run and he was planning to drive. I asked him ask a friend to drive him.  Discussed case with Dr. [name] who asked to see him in the office this afternoon. Apt made for 2pm.   Angelena Form PA-C  [facility]

## 2021-02-03 NOTE — Progress Notes (Signed)
Electrophysiology Office Follow up Visit Note:    Date:  02/03/2021   ID:  Jordan Oconnell, DOB 1940/04/14, MRN [id number]  PCP:  Burnard Bunting, MD  [facility] Cardiologist:  Sanda Klein, MD  [facility] Electrophysiologist:  Vickie Epley, MD    Interval History:    Jordan Oconnell is a 81 y.o. male who presents for a follow up visit.   I met him when he was hospitalized January 29, 2021 for new left bundle branch block and atrial fibrillation after his TAVR.  His medical history also includes urothelial cancer, RCC, CKD 4, hypertension, hyperlipidemia, paroxysmal atrial fibrillation.  During the hospitalization pauses were observed lasting less than 4 seconds while in atrial fibrillation.  He also has tachycardic episodes while in atrial fibrillation.  He was discharged with a ZIO monitor which showed episodes of pauses in the heart rhythm lasting greater than 5 seconds alternating with A. fib with RVR.  He presents today to discuss pacemaker implant as part of the management strategy for his tachybradycardia syndrome.       Past Medical History:  Diagnosis Date   Bladder cancer ([place])    BPH (benign prostatic hyperplasia)    CKD (chronic kidney disease), stage IV (HCC)    Colon polyps    Diverticulosis    GERD (gastroesophageal reflux disease)    Hyperlipidemia    pt denies   Hypertension    PAF (paroxysmal atrial fibrillation) ([place])    on Flecanide and diltiazem. 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 Burt Knack and Dr. Cyndia Bent   Severe aortic stenosis 10/05/2020    Past Surgical History:  Procedure Laterality Date   APPENDECTOMY     BLADDER SURGERY  2008   transurethral resection/resection of prostatic urethra   BOWEL RESECTION     ESOPHAGOGASTRODUODENOSCOPY N/A 07/24/2013   Procedure:  ESOPHAGOGASTRODUODENOSCOPY (EGD);  Surgeon: [name], MD;  Location: [name];  Service: Endoscopy;  Laterality: N/A;   INTRAOPERATIVE TRANSTHORACIC ECHOCARDIOGRAM N/A 01/27/2021   Procedure: INTRAOPERATIVE TRANSTHORACIC ECHOCARDIOGRAM;  Surgeon: Sherren Mocha, MD;  Location: [place] [facility];  Service: Open Heart Surgery;  Laterality: N/A;   laparoscopic surgery  2012   laser   (?)   NEPHRECTOMY  2007   partial, left   NEPHROSTOMY  2011   stent   NM MYOCAR PERF WALL MOTION  09/08/2010   Normal   RIGHT HEART CATH AND CORONARY ANGIOGRAPHY N/A 10/06/2020   Procedure: RIGHT HEART CATH AND CORONARY ANGIOGRAPHY;  Surgeon: Sherren Mocha, MD;  Location: [place] [facility];  Service: Cardiovascular;  Laterality: N/A;   ROBOT ASSISTED LAPAROSCOPIC COMPLETE CYSTECT ILEAL CONDUIT     TRANSCATHETER AORTIC VALVE REPLACEMENT, TRANSFEMORAL Right 01/27/2021   Procedure: TRANSCATHETER AORTIC VALVE REPLACEMENT, TRANSFEMORAL;  Surgeon: Sherren Mocha, MD;  Location: [place] [facility];  Service: Open Heart Surgery;  Laterality: Right;   US ECHOCARDIOGRAPHY  5/63/8756   mild diastolic dysfunction,mild dilated LA,mild MR,AI,mildly dilated aortic root    Current Medications: Current Meds  Medication Sig   ALPRAZolam (XANAX) 0.25 MG tablet Take 0.25 mg by mouth at bedtime.   aspirin 81 MG chewable tablet Chew 1 tablet (81 mg total) by mouth daily.   escitalopram (LEXAPRO) 10 MG tablet Take 10 mg by mouth every morning.   hydrALAZINE (APRESOLINE)  50 MG tablet Take 50 mg by mouth 2 (two) times daily.   latanoprost (XALATAN) 0.005 % ophthalmic solution Place 1 drop into both eyes at bedtime.   mirtazapine (REMERON) 15 MG tablet Take 7.5 mg by mouth at bedtime.   omeprazole (PRILOSEC) 20 MG capsule Take 2 capsules (40 mg total) by mouth daily. (Patient taking differently: Take 20 mg by mouth daily.)   sodium bicarbonate 650 MG tablet Take 1,300 mg by mouth 2 (two) times daily.     Allergies:    Amoxicillin, Doxycycline, and Flomax [tamsulosin hcl]   Social History   Socioeconomic History   Marital status: Divorced    Spouse name: Not on file   Number of children: 2   Years of education: Not on file   Highest education level: Not on file  Occupational History   Occupation: Pharmacist, hospital  Tobacco Use   Smoking status: Never   Smokeless tobacco: Never  Vaping Use   Vaping Use: Never used  Substance and Sexual Activity   Alcohol use: Yes    Comment: occasionally   Drug use: No   Sexual activity: Not on file  Other Topics Concern   Not on file  Social History Narrative   Not on file   Social Determinants of Health   Financial Resource Strain: Not on file  Food Insecurity: Not on file  Transportation Needs: Not on file  Physical Activity: Not on file  Stress: Not on file  Social Connections: Not on file     Family History: The patient's family history includes Breast cancer in his daughter; Colon cancer in his brother, father, and mother; Crohn's disease in his son; Ovarian cancer in his mother.  ROS:   Please see the history of present illness.    All other systems reviewed and are negative.  EKGs/Labs/Other Studies Reviewed:    The following studies were reviewed today:   EKG:  The ekg ordered today demonstrates sinus rhythm, first-degree AV delay with a PR interval of 356 ms.  Left bundle branch block with a QRS duration of 154 ms.  Recent Labs: 01/23/2021: ALT 13; B Natriuretic Peptide 720.2 01/28/2021: Magnesium 1.8 01/29/2021: BUN 40; Creatinine, Ser 2.27; Hemoglobin 11.9; Platelets 79; Potassium 4.2; Sodium 136  Recent Lipid Panel No results found for: CHOL, TRIG, HDL, CHOLHDL, VLDL, LDLCALC, LDLDIRECT  Physical Exam:    VS:  BP 138/84    Pulse 76    Ht 6' 1"  (1.854 m)    Wt 198 lb 9.6 oz (90.1 kg)    SpO2 98%    BMI 26.20 kg/m     Wt Readings from Last 3 Encounters:  02/03/21 198 lb 9.6 oz (90.1 kg)  01/29/21 201 lb 3.2 oz (91.3 kg)  01/23/21 203 lb  (92.1 kg)     GEN:  Well nourished, well developed in no acute distress HEENT: Normal NECK: No JVD; No carotid bruits LYMPHATICS: No lymphadenopathy CARDIAC: RRR, no murmurs, rubs, gallops RESPIRATORY:  Clear to auscultation without rales, wheezing or rhonchi  ABDOMEN: Soft, non-tender, non-distended MUSCULOSKELETAL:  No edema; No deformity  SKIN: Warm and dry NEUROLOGIC:  Alert and oriented x 3 PSYCHIATRIC:  Normal affect        ASSESSMENT:    1. S/P TAVR (transcatheter aortic valve replacement)   2. PAF (paroxysmal atrial fibrillation) ([place])   3. LBBB (left bundle branch block)   4. CKD (chronic kidney disease) stage 4, GFR 15-29 ml/min (HCC)    PLAN:    In  order of problems listed above:  #Tachycardia-bradycardia syndrome #Left bundle branch block Patient has evidence of tachybradycardia syndrome on his ZIO monitor.  He has significant underlying conduction system disease which further complicates the management of his tachybradycardia syndrome.  Given the overall picture, I think a pacemaker is indicated to allow for better control of his wildly fluctuating heart rates.  I discussed the pacemaker implant procedure in detail during today's clinic appointment including the risk, recovery.  I will plan to implant a dual-chamber permanent pacemaker with a left bundle area lead with [employer].  Risks, benefits, alternatives to PPM implantation were discussed in detail with the patient today. The patient understands that the risks include but are not limited to bleeding, infection, pneumothorax, perforation, tamponade, vascular damage, renal failure, MI, stroke, death, and lead dislodgement and wishes to proceed.  We will therefore schedule device implantation at the next available time.  #Severe aortic stenosis post TAVR Prosthesis functioning appropriately.  Recovering well from his recent TAVR.  I did discuss his case with Dr. Burt Knack from the structural heart team who  is in agreement with the plan.  We will get him scheduled in the next week or so for his pacemaker.  He should avoid driving until the pacemaker is implanted.  This was discussed with the patient during today's visit.    Medication Adjustments/Labs and Tests Ordered: Current medicines are reviewed at length with the patient today.  Concerns regarding medicines are outlined above.  Orders Placed This Encounter  Procedures   CBC w/Diff   Basic Metabolic Panel (BMET)   EKG 12-Lead   No orders of the defined types were placed in this encounter.    Signed, Lars Mage, MD, [facility], [facility] 02/03/2021 10:34 PM    Electrophysiology [facility]

## 2021-02-03 NOTE — H&P (View-Only) (Signed)
Electrophysiology Office Follow up Visit Note:    Date:  02/03/2021   ID:  GEORGE HAGGART, DOB 09-Aug-1940, MRN [id number]  PCP:  Burnard Bunting, MD  [facility] Cardiologist:  Sanda Klein, MD  [facility] Electrophysiologist:  Vickie Epley, MD    Interval History:    Jordan Oconnell is a 81 y.o. male who presents for a follow up visit.   I met him when he was hospitalized January 29, 2021 for new left bundle branch block and atrial fibrillation after his TAVR.  His medical history also includes urothelial cancer, RCC, CKD 4, hypertension, hyperlipidemia, paroxysmal atrial fibrillation.  During the hospitalization pauses were observed lasting less than 4 seconds while in atrial fibrillation.  He also has tachycardic episodes while in atrial fibrillation.  He was discharged with a ZIO monitor which showed episodes of pauses in the heart rhythm lasting greater than 5 seconds alternating with A. fib with RVR.  He presents today to discuss pacemaker implant as part of the management strategy for his tachybradycardia syndrome.       Past Medical History:  Diagnosis Date   Bladder cancer ([place])    BPH (benign prostatic hyperplasia)    CKD (chronic kidney disease), stage IV (HCC)    Colon polyps    Diverticulosis    GERD (gastroesophageal reflux disease)    Hyperlipidemia    pt denies   Hypertension    PAF (paroxysmal atrial fibrillation) ([place])    on Flecanide and diltiazem. 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 Burt Knack and Dr. Cyndia Bent   Severe aortic stenosis 10/05/2020    Past Surgical History:  Procedure Laterality Date   APPENDECTOMY     BLADDER SURGERY  2008   transurethral resection/resection of prostatic urethra   BOWEL RESECTION     ESOPHAGOGASTRODUODENOSCOPY N/A 07/24/2013   Procedure:  ESOPHAGOGASTRODUODENOSCOPY (EGD);  Surgeon: [name], MD;  Location: [name];  Service: Endoscopy;  Laterality: N/A;   INTRAOPERATIVE TRANSTHORACIC ECHOCARDIOGRAM N/A 01/27/2021   Procedure: INTRAOPERATIVE TRANSTHORACIC ECHOCARDIOGRAM;  Surgeon: Sherren Mocha, MD;  Location: [facility];  Service: Open Heart Surgery;  Laterality: N/A;   laparoscopic surgery  2012   laser   (?)   NEPHRECTOMY  2007   partial, left   NEPHROSTOMY  2011   stent   NM MYOCAR PERF WALL MOTION  09/08/2010   Normal   RIGHT HEART CATH AND CORONARY ANGIOGRAPHY N/A 10/06/2020   Procedure: RIGHT HEART CATH AND CORONARY ANGIOGRAPHY;  Surgeon: Sherren Mocha, MD;  Location: [facility];  Service: Cardiovascular;  Laterality: N/A;   ROBOT ASSISTED LAPAROSCOPIC COMPLETE CYSTECT ILEAL CONDUIT     TRANSCATHETER AORTIC VALVE REPLACEMENT, TRANSFEMORAL Right 01/27/2021   Procedure: TRANSCATHETER AORTIC VALVE REPLACEMENT, TRANSFEMORAL;  Surgeon: Sherren Mocha, MD;  Location: [facility];  Service: Open Heart Surgery;  Laterality: Right;   US ECHOCARDIOGRAPHY  8/33/8250   mild diastolic dysfunction,mild dilated LA,mild MR,AI,mildly dilated aortic root    Current Medications: Current Meds  Medication Sig   ALPRAZolam (XANAX) 0.25 MG tablet Take 0.25 mg by mouth at bedtime.   aspirin 81 MG chewable tablet Chew 1 tablet (81 mg total) by mouth daily.   escitalopram (LEXAPRO) 10 MG tablet Take 10 mg by mouth every morning.   hydrALAZINE (APRESOLINE)  50 MG tablet Take 50 mg by mouth 2 (two) times daily.   latanoprost (XALATAN) 0.005 % ophthalmic solution Place 1 drop into both eyes at bedtime.   mirtazapine (REMERON) 15 MG tablet Take 7.5 mg by mouth at bedtime.   omeprazole (PRILOSEC) 20 MG capsule Take 2 capsules (40 mg total) by mouth daily. (Patient taking differently: Take 20 mg by mouth daily.)   sodium bicarbonate 650 MG tablet Take 1,300 mg by mouth 2 (two) times daily.     Allergies:    Amoxicillin, Doxycycline, and Flomax [tamsulosin hcl]   Social History   Socioeconomic History   Marital status: Divorced    Spouse name: Not on file   Number of children: 2   Years of education: Not on file   Highest education level: Not on file  Occupational History   Occupation: Pharmacist, hospital  Tobacco Use   Smoking status: Never   Smokeless tobacco: Never  Vaping Use   Vaping Use: Never used  Substance and Sexual Activity   Alcohol use: Yes    Comment: occasionally   Drug use: No   Sexual activity: Not on file  Other Topics Concern   Not on file  Social History Narrative   Not on file   Social Determinants of Health   Financial Resource Strain: Not on file  Food Insecurity: Not on file  Transportation Needs: Not on file  Physical Activity: Not on file  Stress: Not on file  Social Connections: Not on file     Family History: The patient's family history includes Breast cancer in his daughter; Colon cancer in his brother, father, and mother; Crohn's disease in his son; Ovarian cancer in his mother.  ROS:   Please see the history of present illness.    All other systems reviewed and are negative.  EKGs/Labs/Other Studies Reviewed:    The following studies were reviewed today:   EKG:  The ekg ordered today demonstrates sinus rhythm, first-degree AV delay with a PR interval of 356 ms.  Left bundle branch block with a QRS duration of 154 ms.  Recent Labs: 01/23/2021: ALT 13; B Natriuretic Peptide 720.2 01/28/2021: Magnesium 1.8 01/29/2021: BUN 40; Creatinine, Ser 2.27; Hemoglobin 11.9; Platelets 79; Potassium 4.2; Sodium 136  Recent Lipid Panel No results found for: CHOL, TRIG, HDL, CHOLHDL, VLDL, LDLCALC, LDLDIRECT  Physical Exam:    VS:  BP 138/84    Pulse 76    Ht 6' 1"  (1.854 m)    Wt 198 lb 9.6 oz (90.1 kg)    SpO2 98%    BMI 26.20 kg/m     Wt Readings from Last 3 Encounters:  02/03/21 198 lb 9.6 oz (90.1 kg)  01/29/21 201 lb 3.2 oz (91.3 kg)  01/23/21 203 lb  (92.1 kg)     GEN:  Well nourished, well developed in no acute distress HEENT: Normal NECK: No JVD; No carotid bruits LYMPHATICS: No lymphadenopathy CARDIAC: RRR, no murmurs, rubs, gallops RESPIRATORY:  Clear to auscultation without rales, wheezing or rhonchi  ABDOMEN: Soft, non-tender, non-distended MUSCULOSKELETAL:  No edema; No deformity  SKIN: Warm and dry NEUROLOGIC:  Alert and oriented x 3 PSYCHIATRIC:  Normal affect        ASSESSMENT:    1. S/P TAVR (transcatheter aortic valve replacement)   2. PAF (paroxysmal atrial fibrillation) ([place])   3. LBBB (left bundle branch block)   4. CKD (chronic kidney disease) stage 4, GFR 15-29 ml/min (HCC)    PLAN:    In  order of problems listed above:  #Tachycardia-bradycardia syndrome #Left bundle branch block Patient has evidence of tachybradycardia syndrome on his ZIO monitor.  He has significant underlying conduction system disease which further complicates the management of his tachybradycardia syndrome.  Given the overall picture, I think a pacemaker is indicated to allow for better control of his wildly fluctuating heart rates.  I discussed the pacemaker implant procedure in detail during today's clinic appointment including the risk, recovery.  I will plan to implant a dual-chamber permanent pacemaker with a left bundle area lead with [employer].  Risks, benefits, alternatives to PPM implantation were discussed in detail with the patient today. The patient understands that the risks include but are not limited to bleeding, infection, pneumothorax, perforation, tamponade, vascular damage, renal failure, MI, stroke, death, and lead dislodgement and wishes to proceed.  We will therefore schedule device implantation at the next available time.  #Severe aortic stenosis post TAVR Prosthesis functioning appropriately.  Recovering well from his recent TAVR.  I did discuss his case with Dr. Burt Knack from the structural heart team who  is in agreement with the plan.  We will get him scheduled in the next week or so for his pacemaker.  He should avoid driving until the pacemaker is implanted.  This was discussed with the patient during today's visit.    Medication Adjustments/Labs and Tests Ordered: Current medicines are reviewed at length with the patient today.  Concerns regarding medicines are outlined above.  Orders Placed This Encounter  Procedures   CBC w/Diff   Basic Metabolic Panel (BMET)   EKG 12-Lead   No orders of the defined types were placed in this encounter.    Signed, Lars Mage, MD, [facility], [facility] 02/03/2021 10:34 PM    Electrophysiology [facility]

## 2021-02-03 NOTE — Patient Instructions (Addendum)
Medication Instructions:  Your physician recommends that you continue on your current medications as directed. Please refer to the Current Medication list given to you today. *If you need a refill on your cardiac medications before your next appointment, please call your pharmacy*  Lab Work: CBC/diff, BMP If you have labs (blood work) drawn today and your tests are completely normal, you will receive your results only by: [place] (if you have MyChart) OR A paper copy in the mail If you have any lab test that is abnormal or we need to change your treatment, we will call you to review the results.  Testing/Procedures: Your physician has recommended that you have a pacemaker inserted. A pacemaker is a small device that is placed under the skin of your chest or abdomen to help control abnormal heart rhythms. This device uses electrical pulses to prompt the heart to beat at a normal rate. Pacemakers are used to treat heart rhythms that are too slow. Wire (leads) are attached to the pacemaker that goes into the chambers of you heart. This is done in the hospital and usually requires and overnight stay. Please see the instruction sheet given to you today for more information.   Follow-Up: At [facility], you and your health needs are our priority.  As part of our continuing mission to provide you with exceptional heart care, we have created designated Provider Care Teams.  These Care Teams include your primary Cardiologist (physician) and Advanced Practice Providers (APPs -  Physician Assistants and Nurse Practitioners) who all work together to provide you with the care you need, when you need it.  Your physician wants you to follow-up in: see instruction letter  We recommend signing up for the patient portal called "MyChart".  Sign up information is provided on this After Visit Summary.  MyChart is used to connect with patients for Virtual Visits (Telemedicine).  Patients are able to view  lab/test results, encounter notes, upcoming appointments, etc.  Non-urgent messages can be sent to your provider as well.   To learn more about what you can do with MyChart, go to [email or web address].    Any Other Special Instructions Will Be Listed Below (If Applicable).  Pacemaker Implantation, Adult Pacemaker implantation is a procedure to place a pacemaker inside the chest. A pacemaker is a small computer that sends electrical signals to the heart and helps the heart beat normally. A pacemaker also stores information about heart rhythms. You may need pacemaker implantation if you have: A slow heartbeat (bradycardia). Loss of consciousness that happens repeatedly (syncope) or repeated episodes of dizziness or light-headedness because of an irregular heart rate. Shortness of breath (dyspnea) due to heart problems. The pacemaker usually attaches to your heart through a wire called a lead. One or two leads may be needed. There are different types of pacemakers: Transvenous pacemaker. This type is placed under the skin or muscle of your upper chest area. The lead goes through a vein in the chest area to reach the inside of the heart. Epicardial pacemaker. This type is placed under the skin or muscle of your chest or abdomen. The lead goes through your chest to the outside of the heart. Tell a health care provider about: Any allergies you have. All medicines you are taking, including vitamins, herbs, eye drops, creams, and over-the-counter medicines. Any problems you or family members have had with anesthetic medicines. Any blood or bone disorders you have. Any surgeries you have had. Any medical conditions you have. Whether you  are pregnant or may be pregnant. What are the risks? Generally, this is a safe procedure. However, problems may occur, including: Infection. Bleeding. Failure of the pacemaker or the lead. Collapse of a lung or bleeding into a lung. Blood clot inside a blood  vessel with a lead. Damage to the heart. Infection inside the heart (endocarditis). Allergic reactions to medicines. What happens before the procedure? Staying hydrated Follow instructions from your health care provider about hydration, which may include: Up to 2 hours before the procedure - you may continue to drink clear liquids, such as water, clear fruit juice, black coffee, and plain tea.  Eating and drinking restrictions Follow instructions from your health care provider about eating and drinking, which may include: 8 hours before the procedure - stop eating heavy meals or foods, such as meat, fried foods, or fatty foods. 6 hours before the procedure - stop eating light meals or foods, such as toast or cereal. 6 hours before the procedure - stop drinking milk or drinks that contain milk. 2 hours before the procedure - stop drinking clear liquids. Medicines Ask your health care provider about: Changing or stopping your regular medicines. This is especially important if you are taking diabetes medicines or blood thinners. Taking medicines such as aspirin and ibuprofen. These medicines can thin your blood. Do not take these medicines unless your health care provider tells you to take them. Taking over-the-counter medicines, vitamins, herbs, and supplements. Tests You may have: A heart evaluation. This may include: An electrocardiogram (ECG). This involves placing patches on your skin to check your heart rhythm. A chest X-ray. An echocardiogram. This is a test that uses sound waves (ultrasound) to produce an image of the heart. A cardiac rhythm monitor. This is used to record your heart rhythm and any events for a longer period of time. Blood tests. Genetic testing. General instructions Do not use any products that contain nicotine or tobacco for at least 4 weeks before the procedure. These products include cigarettes, e-cigarettes, and chewing tobacco. If you need help quitting, ask  your health care provider. Ask your health care provider: How your surgery site will be marked. What steps will be taken to help prevent infection. These steps may include: Removing hair at the surgery site. Washing skin with a germ-killing soap. Receiving antibiotic medicine. Plan to have someone take you home from the hospital or clinic. If you will be going home right after the procedure, plan to have someone with you for 24 hours. What happens during the procedure? An IV will be inserted into one of your veins. You will be given one or more of the following: A medicine to help you relax (sedative). A medicine to numb the area (local anesthetic). A medicine to make you fall asleep (general anesthetic). The next steps vary depending on the type of pacemaker you will be getting. If you are getting a transvenous pacemaker: An incision will be made in your upper chest. A pocket will be made for the pacemaker. It may be placed under the skin or between layers of muscle. The lead will be inserted into a blood vessel that goes to the heart. While X-rays are taken by an imaging machine (fluoroscopy), the lead will be advanced through the vein to the inside of your heart. The other end of the lead will be tunneled under the skin and attached to the pacemaker. If you are getting an epicardial pacemaker: An incision will be made near your ribs or breastbone (sternum)  for the lead. The lead will be attached to the outside of your heart. Another incision will be made in your chest or upper abdomen to create a pocket for the pacemaker. The free end of the lead will be tunneled under the skin and attached to the pacemaker. The transvenous or epicardial pacemaker will be tested. Imaging studies may be done to check the lead position. The incisions will be closed with stitches (sutures), adhesive strips, or skin glue. Bandages (dressings) will be placed over the incisions. The procedure may vary  among health care providers and hospitals. What happens after the procedure? Your blood pressure, heart rate, breathing rate, and blood oxygen level will be monitored until you leave the hospital or clinic. You may be given antibiotics. You will be given pain medicine. An ECG and chest X-rays will be done. You may need to wear a continuous type of ECG (Holter monitor) to check your heart rhythm. Your health care provider will program the pacemaker. If you were given a sedative during the procedure, it can affect you for several hours. Do not drive or operate machinery until your health care provider says that it is safe. You will be given a pacemaker identification card. This card lists the implant date, device model, and manufacturer of your pacemaker. Summary A pacemaker is a small computer that sends electrical signals to the heart and helps the heart beat normally. There are different types of pacemakers. A pacemaker may be placed under the skin or muscle of your chest or abdomen. Follow instructions from your health care provider about eating and drinking and about taking medicines before the procedure. This information is not intended to replace advice given to you by your health care provider. Make sure you discuss any questions you have with your health care provider. Document Revised: 09/23/2020 Document Reviewed: 12/13/2018 Elsevier Patient Education  2022 [employer].

## 2021-02-04 ENCOUNTER — Ambulatory Visit: Payer: Medicare PPO | Admitting: Cardiovascular Disease

## 2021-02-04 LAB — CBC WITH DIFFERENTIAL/PLATELET
Basophils Absolute: 0.1 10*3/uL (ref 0.0–0.2)
Basos: 1 %
EOS (ABSOLUTE): 0.4 10*3/uL (ref 0.0–0.4)
Eos: 5 %
Hematocrit: 42.1 % (ref 37.5–51.0)
Hemoglobin: 13.6 g/dL (ref 13.0–17.7)
Immature Grans (Abs): 0 10*3/uL (ref 0.0–0.1)
Immature Granulocytes: 0 %
Lymphocytes Absolute: 1.2 10*3/uL (ref 0.7–3.1)
Lymphs: 16 %
MCH: 29.4 pg (ref 26.6–33.0)
MCHC: 32.3 g/dL (ref 31.5–35.7)
MCV: 91 fL (ref 79–97)
Monocytes Absolute: 0.6 10*3/uL (ref 0.1–0.9)
Monocytes: 7 %
Neutrophils Absolute: 5.2 10*3/uL (ref 1.4–7.0)
Neutrophils: 71 %
Platelets: 153 10*3/uL (ref 150–450)
RBC: 4.63 x10E6/uL (ref 4.14–5.80)
RDW: 13.1 % (ref 11.6–15.4)
WBC: 7.4 10*3/uL (ref 3.4–10.8)

## 2021-02-04 LAB — BASIC METABOLIC PANEL
BUN/Creatinine Ratio: 24 (ref 10–24)
BUN: 61 mg/dL — ABNORMAL HIGH (ref 8–27)
CO2: 16 mmol/L — ABNORMAL LOW (ref 20–29)
Calcium: 9.7 mg/dL (ref 8.6–10.2)
Chloride: 106 mmol/L (ref 96–106)
Creatinine, Ser: 2.57 mg/dL — ABNORMAL HIGH (ref 0.76–1.27)
Glucose: 100 mg/dL — ABNORMAL HIGH (ref 70–99)
Potassium: 5 mmol/L (ref 3.5–5.2)
Sodium: 138 mmol/L (ref 134–144)
eGFR: 24 mL/min/{1.73_m2} — ABNORMAL LOW (ref >59)

## 2021-02-05 ENCOUNTER — Telehealth: Payer: Self-pay | Admitting: Internal Medicine

## 2021-02-05 NOTE — Telephone Encounter (Signed)
Paged by [employer] re 6 sec pause while patient sleeping. Similar episodes previously documented. Mr. Reinheimer is scheduled for PPM next week.

## 2021-02-05 NOTE — Progress Notes (Signed)
[facility] [place]                                     Cardiology Office Note:    Date:  02/06/2021   ID:  Jordan Oconnell, DOB 11-14-1940, MRN [id number]  PCP:  Burnard Bunting, MD  [facility] Cardiologist:  Sanda Klein, MD / Dr. Burt Knack & Dr. Cyndia Bent (TAVR) [facility] Electrophysiologist:  Vickie Epley, MD   Referring MD: Burnard Bunting, MD   [facility] s/p TAVR   History of Present Illness:    Jordan Oconnell is a 81 y.o. male with a hx of extensive urothelial cancer history s/p cystectomy, bilateral ureterectomy in 2015 with ileal conduit followed by left nephrectomy in 2018, paroxysmal atrial fibrillation on Flecanide and diltiazem (not anticoagulated given recurrent hematuria), chronic hyperchloremic metabolic acidosis treated with sodium bicarbonate tablets, CKD stage IV, HTN, HLD and severe aortic stenosis s/p TAVR (01/27/21) who presents to clinic for follow up.  The patient has been followed for several years with aortic stenosis and only recently has become symptomatic.  He has been physically active over time playing pickle ball and walking. He recently developed some exertional dyspnea and chest pressure. He was referred for sturcurual heart consultation. L/RHC showed nonobstructive coronary artery disease with separate LAD and left circumflex ostia, patent LAD with mild nonobstructive plaquing, severe ostial diagonal stenoses, patent left circumflex with moderate intermediate branch stenosis and no other significant stenoses, patent nondominant RCA.  He was evaluated by the multidisciplinary valve team and underwent successful TAVR with a 29 mm Edwards Sapien 3 Ultra Resilia THV via the TF approach on 01/27/21. Post operative echo showed EF 50-55%, normally functioning TAVR with a mean gradient of 10 mmHg and no PVL. He was discharged on aspirin alone. He developed a new LBBB after TAVR and converted to atrial fib with  asymptomatic 3-4 second pauses. He was seen by Dr. [name] during admission who recommended stopping Flecainide and discharging with a live telemetry monitor.   His montior showed afib with a 5 and 6 second pauses. His diltiazem was discontinued. He was seen in the office by Dr. [name] and plan is for PPM on 1/19.  Today the patient presents to clinic for follow up. Here with long time teaching friend. No CP or SOB. No LE edema, orthopnea or PND. No dizziness or syncope. No blood in stool or urine. No palpitations.    Past Medical History:  Diagnosis Date   Bladder cancer ([place])    BPH (benign prostatic hyperplasia)    CKD (chronic kidney disease), stage IV (HCC)    Colon polyps    Diverticulosis    GERD (gastroesophageal reflux disease)    Hyperlipidemia    pt denies   Hypertension    PAF (paroxysmal atrial fibrillation) ([place])    on Flecanide and diltiazem. No OAC given recurrent hematuria   Renal cell carcinoma ([place]) 2008   left   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 Burt Knack and Dr. Cyndia Bent   Severe aortic stenosis 10/05/2020    Past Surgical History:  Procedure Laterality Date   APPENDECTOMY     BLADDER SURGERY  2008   transurethral resection/resection of prostatic urethra   BOWEL RESECTION     ESOPHAGOGASTRODUODENOSCOPY N/A 07/24/2013  Procedure: ESOPHAGOGASTRODUODENOSCOPY (EGD);  Surgeon: [name], MD;  Location: [name];  Service: Endoscopy;  Laterality: N/A;   INTRAOPERATIVE TRANSTHORACIC ECHOCARDIOGRAM N/A 01/27/2021   Procedure: INTRAOPERATIVE TRANSTHORACIC ECHOCARDIOGRAM;  Surgeon: Sherren Mocha, MD;  Location: [facility];  Service: Open Heart Surgery;  Laterality: N/A;   laparoscopic surgery  2012   laser   (?)   NEPHRECTOMY  2007   partial, left   NEPHROSTOMY  2011   stent   NM MYOCAR PERF WALL MOTION  09/08/2010   Normal   RIGHT HEART CATH AND CORONARY  ANGIOGRAPHY N/A 10/06/2020   Procedure: RIGHT HEART CATH AND CORONARY ANGIOGRAPHY;  Surgeon: Sherren Mocha, MD;  Location: [facility];  Service: Cardiovascular;  Laterality: N/A;   ROBOT ASSISTED LAPAROSCOPIC COMPLETE CYSTECT ILEAL CONDUIT     TRANSCATHETER AORTIC VALVE REPLACEMENT, TRANSFEMORAL Right 01/27/2021   Procedure: TRANSCATHETER AORTIC VALVE REPLACEMENT, TRANSFEMORAL;  Surgeon: Sherren Mocha, MD;  Location: [place] [facility];  Service: Open Heart Surgery;  Laterality: Right;   US ECHOCARDIOGRAPHY  6/38/7564   mild diastolic dysfunction,mild dilated LA,mild MR,AI,mildly dilated aortic root    Current Medications: Current Meds  Medication Sig   ALPRAZolam (XANAX) 0.25 MG tablet Take 0.25 mg by mouth at bedtime.   aspirin 81 MG chewable tablet Chew 1 tablet (81 mg total) by mouth daily.   azithromycin (ZITHROMAX) 500 MG tablet Take 1 tablet by mouth 1 hour before dental procedures and cleanings   escitalopram (LEXAPRO) 10 MG tablet Take 10 mg by mouth every morning.   hydrALAZINE (APRESOLINE) 50 MG tablet Take 50 mg by mouth 2 (two) times daily.   latanoprost (XALATAN) 0.005 % ophthalmic solution Place 1 drop into both eyes at bedtime.   mirtazapine (REMERON) 15 MG tablet Take 7.5 mg by mouth at bedtime.   omeprazole (PRILOSEC) 20 MG capsule Take 2 capsules (40 mg total) by mouth daily. (Patient taking differently: Take 20 mg by mouth daily.)   sodium bicarbonate 650 MG tablet Take 1,300 mg by mouth 2 (two) times daily.     Allergies:   Amoxicillin, Doxycycline, and Flomax [tamsulosin hcl]   Social History   Socioeconomic History   Marital status: Divorced    Spouse name: Not on file   Number of children: 2   Years of education: Not on file   Highest education level: Not on file  Occupational History   Occupation: Pharmacist, hospital  Tobacco Use   Smoking status: Never   Smokeless tobacco: Never  Vaping Use   Vaping Use: Never used  Substance and Sexual Activity    Alcohol use: Yes    Comment: occasionally   Drug use: No   Sexual activity: Not on file  Other Topics Concern   Not on file  Social History Narrative   Not on file   Social Determinants of Health   Financial Resource Strain: Not on file  Food Insecurity: Not on file  Transportation Needs: Not on file  Physical Activity: Not on file  Stress: Not on file  Social Connections: Not on file     Family History: The patient's family history includes Breast cancer in his daughter; Colon cancer in his brother, father, and mother; Crohn's disease in his son; Ovarian cancer in his mother.  ROS:   Please see the history of present illness.    All other systems reviewed and are negative.  EKGs/Labs/Other Studies Reviewed:    The following studies were reviewed today:  TAVR OPERATIVE NOTE  Date of Procedure:                01/27/2021   Preoperative Diagnosis:      Severe Aortic Stenosis    Postoperative Diagnosis:    Same    Procedure:        Transcatheter Aortic Valve Replacement - Percutaneous Right Transfemoral Approach             Edwards Sapien 3 Ultra Resilia THV (size 29 mm, model # 9755RSL, serial # [id number])              Co-Surgeons:                        Gaye Pollack, MD and Sherren Mocha, MD       Anesthesiologist:                  Jenita Seashore, MD   Echocardiographer:              Edmonia James, MD   Pre-operative Echo Findings: Severe aortic stenosis Normal left ventricular systolic function   Post-operative Echo Findings: No paravalvular leak Normal left ventricular systolic function   _____________________   Echo 01/28/21:  IMPRESSIONS   1. The aortic valve has been repaired/replaced. There is a 29 mm Edwards  Ultra, stented (TAVR) valve present in the aortic position. Echo findings  are consistent with normal structure and function of the aortic valve  prosthesis. Mean gradient 72mmHg,  Vmax 2.0 m/s, DI 0.52. There is no paravalvular leak.   2. Left  ventricular ejection fraction, by estimation, is 50 to 55%. The  left ventricle has low normal function. The left ventricle has no regional  wall motion abnormalities. There is moderate concentric left ventricular  hypertrophy. Left ventricular  diastolic parameters are consistent with Grade II diastolic dysfunction  (pseudonormalization). Elevated left atrial pressure.   3. Right ventricular systolic function is normal. The right ventricular  size is normal.   4. Left atrial size was moderately dilated.   5. The mitral valve is grossly normal. Mild mitral valve regurgitation.  Moderate mitral annular calcification.   6. Aortic dilatation noted. There is mild dilatation of the ascending  aorta, measuring 42 mm.   7. The inferior vena cava is normal in size with greater than 50%  respiratory variability, suggesting right atrial pressure of 3 mmHg.  EKG:  EKG is ordered today.  The ekg ordered today demonstrates afib with CVR  Recent Labs: 01/23/2021: ALT 13; B Natriuretic Peptide 720.2 01/28/2021: Magnesium 1.8 02/03/2021: BUN 61; Creatinine, Ser 2.57; Hemoglobin 13.6; Platelets 153; Potassium 5.0; Sodium 138  Recent Lipid Panel No results found for: CHOL, TRIG, HDL, CHOLHDL, VLDL, LDLCALC, LDLDIRECT   Risk Assessment/Calculations:    CHA2DS2-VASc Score = 4   This indicates a 4.8% annual risk of stroke. The patient's score is based upon: CHF History: 1 HTN History: 1 Diabetes History: 0 Stroke History: 0 Vascular Disease History: 0 Age Score: 2 Gender Score: 0      Physical Exam:    VS:  BP (!) 146/86    Pulse 86    Ht 6\' 1"  (1.854 m)    Wt 199 lb (90.3 kg)    SpO2 99%    BMI 26.25 kg/m     Wt Readings from Last 3 Encounters:  02/06/21 199 lb (90.3 kg)  02/03/21 198 lb 9.6 oz (90.1 kg)  01/29/21 201 lb 3.2 oz (91.3 kg)  GEN:  Well nourished, well developed in no acute distress HEENT: Normal NECK: No JVD LYMPHATICS: No lymphadenopathy CARDIAC: irreg irreg,  no  murmurs, rubs, gallops RESPIRATORY:  Clear to auscultation without rales, wheezing or rhonchi  ABDOMEN: Soft, non-tender, non-distended MUSCULOSKELETAL:  No edema; No deformity  SKIN: Warm and dry.  Groin sites clear without hematoma or ecchymosis  NEUROLOGIC:  Alert and oriented x 3 PSYCHIATRIC:  Normal affect   ASSESSMENT:    1. S/P TAVR (transcatheter aortic valve replacement)   2. PAF (paroxysmal atrial fibrillation) ([place])   3. Coronary artery disease involving native heart without angina pectoris, unspecified vessel or lesion type   4. Essential hypertension   5. CKD (chronic kidney disease) stage 4, GFR 15-29 ml/min (HCC)   6. Urothelial cancer ([place])    PLAN:    In order of problems listed above:  Severe AS s/p TAVR: doing well 1 week out from TAVR. Groin sites stable. ECG with no HAVB. SBE prophylaxis discussed; I have RX'd azithromycin due to a PCN allergy. I will see him back in 1 month with an echo    PAF: previously paroxysmal but now persistent. Not on [place] due to recurrent hematuria. Could potentially be a Watchman candidate in the future, but needs to stay of [facility] until urologic biopsy completed. Given [employer] today.   CAD: pre TAVR cath showed nonobstructive coronary artery disease with separate LAD and left circumflex ostia, patent LAD with mild nonobstructive plaquing, severe ostial diagonal stenoses, patent left circumflex with moderate intermediate branch stenosis and no other significant stenoses, patent nondominant RCA. Continue medical therapy.    HTN: BP borderline today. No changes made.    CKD stage IV: creat 2.57 on last check. This has remained stable.    Urologic disease: he has a history of urothelial cancer history s/p cystectomy, bilateral ureterectomy in 2015 with ileal conduit followed by left nephrectomy in 2018. Pre TAVR CT scans showed a urothelial mass in the right renal pelvis, with additional areas of soft tissue thickening and enhancement  within the right ileal conduit, concerning for recurrent urothelial neoplasm. He is closely followed by Dr. [name] with [facility] who plans for biopsy after TAVR.     Cardiac Rehabilitation Eligibility Assessment  The patient is ready to start cardiac rehabilitation from a cardiac standpoint.         Medication Adjustments/Labs and Tests Ordered: Current medicines are reviewed at length with the patient today.  Concerns regarding medicines are outlined above.  Orders Placed This Encounter  Procedures   EKG 12-Lead   Meds ordered this encounter  Medications   azithromycin (ZITHROMAX) 500 MG tablet    Sig: Take 1 tablet by mouth 1 hour before dental procedures and cleanings    Dispense:  12 tablet    Refill:  6    Patient Instructions  Medication Instructions:  Start Azithromycin 500 mg, take 1 tablet by mouth 1 hour prior to dental procedures and cleanings   *If you need a refill on your cardiac medications before your next appointment, please call your pharmacy*   Lab Work: None ordered   If you have labs (blood work) drawn today and your tests are completely normal, you will receive your results only by: MyChart Message (if you have MyChart) OR A paper copy in the mail If you have any lab test that is abnormal or we need to change your treatment, we will call you to review the results.   Testing/Procedures: None ordered  Follow-Up: Follow up as scheduled    Other Instructions None     Signed, Angelena Form, PA-C  02/06/2021 3:50 PM    [facility]

## 2021-02-06 ENCOUNTER — Other Ambulatory Visit: Payer: Self-pay

## 2021-02-06 ENCOUNTER — Encounter: Payer: Self-pay | Admitting: Physician Assistant

## 2021-02-06 ENCOUNTER — Ambulatory Visit: Payer: Medicare PPO | Admitting: Physician Assistant

## 2021-02-06 VITALS — BP 146/86 | HR 86 | Ht 73.0 in | Wt 199.0 lb

## 2021-02-06 DIAGNOSIS — Z952 Presence of prosthetic heart valve: Secondary | ICD-10-CM | POA: Diagnosis not present

## 2021-02-06 DIAGNOSIS — I480 Paroxysmal atrial fibrillation: Secondary | ICD-10-CM

## 2021-02-06 DIAGNOSIS — N184 Chronic kidney disease, stage 4 (severe): Secondary | ICD-10-CM | POA: Diagnosis not present

## 2021-02-06 DIAGNOSIS — I2510 Atherosclerotic heart disease of native coronary artery without angina pectoris: Secondary | ICD-10-CM | POA: Diagnosis not present

## 2021-02-06 DIAGNOSIS — I10 Essential (primary) hypertension: Secondary | ICD-10-CM

## 2021-02-06 DIAGNOSIS — C689 Malignant neoplasm of urinary organ, unspecified: Secondary | ICD-10-CM

## 2021-02-06 MED ORDER — AZITHROMYCIN 500 MG PO TABS: ORAL_TABLET | ORAL | 6 refills | Status: DC

## 2021-02-06 NOTE — Patient Instructions (Signed)
Medication Instructions:  Start Azithromycin 500 mg, take 1 tablet by mouth 1 hour prior to dental procedures and cleanings   *If you need a refill on your cardiac medications before your next appointment, please call your pharmacy*   Lab Work: None ordered   If you have labs (blood work) drawn today and your tests are completely normal, you will receive your results only by: [place] (if you have MyChart) OR A paper copy in the mail If you have any lab test that is abnormal or we need to change your treatment, we will call you to review the results.   Testing/Procedures: None ordered    Follow-Up: Follow up as scheduled    Other Instructions None

## 2021-02-09 ENCOUNTER — Telehealth (HOSPITAL_COMMUNITY): Payer: Self-pay

## 2021-02-09 DIAGNOSIS — Z125 Encounter for screening for malignant neoplasm of prostate: Secondary | ICD-10-CM | POA: Diagnosis not present

## 2021-02-09 DIAGNOSIS — E785 Hyperlipidemia, unspecified: Secondary | ICD-10-CM | POA: Diagnosis not present

## 2021-02-09 DIAGNOSIS — R7301 Impaired fasting glucose: Secondary | ICD-10-CM | POA: Diagnosis not present

## 2021-02-09 DIAGNOSIS — I10 Essential (primary) hypertension: Secondary | ICD-10-CM | POA: Diagnosis not present

## 2021-02-09 NOTE — Telephone Encounter (Signed)
Pt is not interested in the cardiac rehab program at this time. Closed referral. 

## 2021-02-11 ENCOUNTER — Telehealth: Payer: Self-pay | Admitting: Interventional Cardiology

## 2021-02-11 DIAGNOSIS — I480 Paroxysmal atrial fibrillation: Secondary | ICD-10-CM | POA: Diagnosis not present

## 2021-02-11 DIAGNOSIS — I447 Left bundle-branch block, unspecified: Secondary | ICD-10-CM | POA: Diagnosis not present

## 2021-02-11 DIAGNOSIS — Z952 Presence of prosthetic heart valve: Secondary | ICD-10-CM | POA: Diagnosis not present

## 2021-02-11 NOTE — Pre-Procedure Instructions (Signed)
Instructed patient on the following items: Arrival time 1330 Nothing to eat or drink after midnight No meds AM of procedure Responsible person to drive you home and stay with you for 24 hrs Wash with special soap night before and morning of procedure  

## 2021-02-11 NOTE — Telephone Encounter (Signed)
Paged by [employer] regarding back to back pauses. 1st was 7.7 seconds and the 2nd was 3.3 seconds. Similar episodes previously documented. [name] is scheduled for PPM tomorrow.

## 2021-02-12 ENCOUNTER — Encounter (HOSPITAL_COMMUNITY)
Admission: RE | Disposition: A | Discharge status: Home / Self Care | Payer: Self-pay | Source: Home / Self Care | Attending: Cardiology

## 2021-02-12 ENCOUNTER — Ambulatory Visit (HOSPITAL_COMMUNITY)
Admission: RE | Admit: 2021-02-12 | Discharge: 2021-02-13 | Disposition: A | Discharge status: Home / Self Care | Payer: Medicare PPO | Attending: Cardiology | Admitting: Cardiology

## 2021-02-12 ENCOUNTER — Other Ambulatory Visit: Payer: Self-pay

## 2021-02-12 DIAGNOSIS — I495 Sick sinus syndrome: Secondary | ICD-10-CM | POA: Insufficient documentation

## 2021-02-12 DIAGNOSIS — Z950 Presence of cardiac pacemaker: Secondary | ICD-10-CM | POA: Diagnosis present

## 2021-02-12 DIAGNOSIS — Z952 Presence of prosthetic heart valve: Secondary | ICD-10-CM | POA: Insufficient documentation

## 2021-02-12 DIAGNOSIS — I480 Paroxysmal atrial fibrillation: Secondary | ICD-10-CM | POA: Insufficient documentation

## 2021-02-12 DIAGNOSIS — E785 Hyperlipidemia, unspecified: Secondary | ICD-10-CM | POA: Insufficient documentation

## 2021-02-12 DIAGNOSIS — I447 Left bundle-branch block, unspecified: Secondary | ICD-10-CM | POA: Diagnosis not present

## 2021-02-12 DIAGNOSIS — I129 Hypertensive chronic kidney disease with stage 1 through stage 4 chronic kidney disease, or unspecified chronic kidney disease: Secondary | ICD-10-CM | POA: Insufficient documentation

## 2021-02-12 DIAGNOSIS — C679 Malignant neoplasm of bladder, unspecified: Secondary | ICD-10-CM | POA: Insufficient documentation

## 2021-02-12 DIAGNOSIS — N184 Chronic kidney disease, stage 4 (severe): Secondary | ICD-10-CM | POA: Diagnosis not present

## 2021-02-12 HISTORY — PX: PACEMAKER IMPLANT: EP1218

## 2021-02-12 SURGERY — PACEMAKER IMPLANT

## 2021-02-12 MED ORDER — LIDOCAINE HCL (PF) 1 % IJ SOLN
INTRAMUSCULAR | Status: AC
  Filled 2021-02-12: qty 30

## 2021-02-12 MED ORDER — FENTANYL CITRATE (PF) 100 MCG/2ML IJ SOLN
INTRAMUSCULAR | Status: AC
  Filled 2021-02-12: qty 2

## 2021-02-12 MED ORDER — HEPARIN (PORCINE) IN NACL 1000-0.9 UT/500ML-% IV SOLN
INTRAVENOUS | Status: AC
  Filled 2021-02-12: qty 500

## 2021-02-12 MED ORDER — SODIUM CHLORIDE 0.9 % IV SOLN: INTRAVENOUS | Status: DC

## 2021-02-12 MED ORDER — LIDOCAINE HCL (PF) 1 % IJ SOLN
INTRAMUSCULAR | Status: AC
  Filled 2021-02-12: qty 60

## 2021-02-12 MED ORDER — ONDANSETRON HCL 4 MG/2ML IJ SOLN: 4.0000 mg | Freq: Four times a day (QID) | INTRAMUSCULAR | Status: DC | PRN

## 2021-02-12 MED ORDER — MIDAZOLAM HCL 5 MG/5ML IJ SOLN
INTRAMUSCULAR | Status: AC
  Filled 2021-02-12: qty 5

## 2021-02-12 MED ORDER — ACETAMINOPHEN 325 MG PO TABS
325.0000 mg | ORAL_TABLET | ORAL | Status: DC | PRN
  Administered 2021-02-12: 325 mg via ORAL
  Administered 2021-02-12: 650 mg via ORAL
  Filled 2021-02-12 (×2): qty 2

## 2021-02-12 MED ORDER — POVIDONE-IODINE 10 % EX SWAB
2.0000 "application " | Freq: Once | CUTANEOUS | Status: AC
  Administered 2021-02-12: 2 via TOPICAL

## 2021-02-12 MED ORDER — MIDAZOLAM HCL 5 MG/5ML IJ SOLN
INTRAMUSCULAR | Status: DC | PRN
  Administered 2021-02-12 (×2): 1 mg via INTRAVENOUS

## 2021-02-12 MED ORDER — CHLORHEXIDINE GLUCONATE 4 % EX LIQD: 4.0000 "application " | Freq: Once | CUTANEOUS | Status: DC

## 2021-02-12 MED ORDER — SODIUM CHLORIDE 0.9 % IV SOLN
INTRAVENOUS | Status: AC
  Filled 2021-02-12: qty 2

## 2021-02-12 MED ORDER — PANTOPRAZOLE SODIUM 40 MG PO TBEC
40.0000 mg | DELAYED_RELEASE_TABLET | Freq: Every day | ORAL | Status: DC
  Administered 2021-02-13: 40 mg via ORAL
  Filled 2021-02-12: qty 1

## 2021-02-12 MED ORDER — MIRTAZAPINE 7.5 MG PO TABS
7.5000 mg | ORAL_TABLET | Freq: Every day | ORAL | Status: DC
Start: 2021-02-12 — End: 2021-02-13
  Administered 2021-02-12: 7.5 mg via ORAL
  Filled 2021-02-12 (×2): qty 1

## 2021-02-12 MED ORDER — VANCOMYCIN HCL IN DEXTROSE 1-5 GM/200ML-% IV SOLN
1000.0000 mg | INTRAVENOUS | Status: AC
  Administered 2021-02-12: 1000 mg via INTRAVENOUS

## 2021-02-12 MED ORDER — ALPRAZOLAM 0.25 MG PO TABS
0.2500 mg | ORAL_TABLET | Freq: Once | ORAL | Status: AC
  Administered 2021-02-12: 0.25 mg via ORAL
  Filled 2021-02-12: qty 1

## 2021-02-12 MED ORDER — SODIUM CHLORIDE 0.9 % IV SOLN
80.0000 mg | INTRAVENOUS | Status: AC
  Administered 2021-02-12: 80 mg

## 2021-02-12 MED ORDER — FENTANYL CITRATE (PF) 100 MCG/2ML IJ SOLN
INTRAMUSCULAR | Status: DC | PRN
  Administered 2021-02-12 (×2): 25 ug via INTRAVENOUS

## 2021-02-12 MED ORDER — VANCOMYCIN HCL IN DEXTROSE 1-5 GM/200ML-% IV SOLN
INTRAVENOUS | Status: AC
  Filled 2021-02-12: qty 200

## 2021-02-12 MED ORDER — LIDOCAINE HCL (PF) 1 % IJ SOLN
INTRAMUSCULAR | Status: DC | PRN
  Administered 2021-02-12: 30 mL
  Administered 2021-02-12: 60 mL

## 2021-02-12 MED ORDER — HEPARIN (PORCINE) IN NACL 1000-0.9 UT/500ML-% IV SOLN
INTRAVENOUS | Status: DC | PRN
  Administered 2021-02-12: 500 mL

## 2021-02-12 MED ORDER — HYDRALAZINE HCL 50 MG PO TABS
50.0000 mg | ORAL_TABLET | Freq: Two times a day (BID) | ORAL | Status: DC
  Administered 2021-02-12 – 2021-02-13 (×2): 50 mg via ORAL
  Filled 2021-02-12 (×2): qty 1

## 2021-02-12 MED ORDER — DILTIAZEM HCL ER COATED BEADS 180 MG PO CP24
180.0000 mg | ORAL_CAPSULE | Freq: Every day | ORAL | Status: DC
  Administered 2021-02-12: 180 mg via ORAL
  Filled 2021-02-12: qty 1

## 2021-02-12 SURGICAL SUPPLY — 13 items
CABLE SURGICAL S-101-97-12 (CABLE) ×2 IMPLANT
CATH SELECT PACE 669183 (CATHETERS) ×1 IMPLANT
CUTTER LV DELIVERY CATHETER 7 (MISCELLANEOUS) ×1 IMPLANT
LEAD INGEVITY 7841 52 (Lead) ×1 IMPLANT
LEAD INGEVITY 7842 59 (Lead) ×1 IMPLANT
PACEMAKER ACCOLADE DR-EL (Pacemaker) ×1 IMPLANT
PAD DEFIB RADIO PHYSIO CONN (PAD) ×2 IMPLANT
POUCH AIGIS-R ANTIBACT ICD (Mesh General) ×2 IMPLANT
POUCH AIGIS-R ANTIBACT ICD LRG (Mesh General) IMPLANT
SHEATH 7FR PRELUDE SNAP 13 (SHEATH) ×1 IMPLANT
SHEATH 8FR PRELUDE SNAP 13 (SHEATH) ×1 IMPLANT
TRAY PACEMAKER INSERTION (PACKS) ×2 IMPLANT
WIRE HI TORQ VERSACORE-J 145CM (WIRE) ×1 IMPLANT

## 2021-02-12 NOTE — Discharge Instructions (Signed)
° ° °  Supplemental Discharge Instructions for  Pacemaker/Defibrillator Patients  Tomorrow, 02/13/21, send in a device transmission  Activity No heavy lifting or vigorous activity with your left/right arm for 6 to 8 weeks.  Do not raise your left/right arm above your head for one week.  Gradually raise your affected arm as drawn below.             02/17/21                     02/18/21                    02/19/21                   02/20/21 __  NO DRIVING for   1 week  ; you may begin driving on   4/88/89  .  WOUND CARE Keep the wound area clean and dry.  Do not get this area wet , no showers for one week; you may shower on   02/20/21  . Tomorrow, 02/13/21, remove the arm sling Tomorrow, 02/13/21 remove the LARGE outer plastic bandage.  Underneath the plastic bandage there are steri strips (paper tapes), DO NOT remove these. The tape/steri-strips on your wound will fall off; do not pull them off.  No bandage is needed on the site.  DO  NOT apply any creams, oils, or ointments to the wound area. If you notice any drainage or discharge from the wound, any swelling or bruising at the site, or you develop a fever > 101? F after you are discharged home, call the office at once.  Special Instructions You are still able to use cellular telephones; use the ear opposite the side where you have your pacemaker/defibrillator.  Avoid carrying your cellular phone near your device. When traveling through airports, show security personnel your identification card to avoid being screened in the metal detectors.  Ask the security personnel to use the hand wand. Avoid arc welding equipment, MRI testing (magnetic resonance imaging), TENS units (transcutaneous nerve stimulators).  Call the office for questions about other devices. Avoid electrical appliances that are in poor condition or are not properly grounded. Microwave ovens are safe to be near or to operate.

## 2021-02-12 NOTE — Interval H&P Note (Signed)
History and Physical Interval Note:  02/12/2021 2:07 PM  Jordan Oconnell  has presented today for surgery, with the diagnosis of bradicardia.  The various methods of treatment have been discussed with the patient and family. After consideration of risks, benefits and other options for treatment, the patient has consented to  Procedure(s): PACEMAKER IMPLANT (N/A) as a surgical intervention.  The patient's history has been reviewed, patient examined, no change in status, stable for surgery.  I have reviewed the patient's chart and labs.  Questions were answered to the patient's satisfaction.     Jadine Brumley T Kateryn Marasigan

## 2021-02-13 ENCOUNTER — Encounter (HOSPITAL_COMMUNITY): Payer: Self-pay | Admitting: Cardiology

## 2021-02-13 ENCOUNTER — Ambulatory Visit (HOSPITAL_COMMUNITY): Payer: Medicare PPO

## 2021-02-13 DIAGNOSIS — I129 Hypertensive chronic kidney disease with stage 1 through stage 4 chronic kidney disease, or unspecified chronic kidney disease: Secondary | ICD-10-CM | POA: Diagnosis not present

## 2021-02-13 DIAGNOSIS — I480 Paroxysmal atrial fibrillation: Secondary | ICD-10-CM | POA: Diagnosis not present

## 2021-02-13 DIAGNOSIS — E785 Hyperlipidemia, unspecified: Secondary | ICD-10-CM | POA: Diagnosis not present

## 2021-02-13 DIAGNOSIS — N184 Chronic kidney disease, stage 4 (severe): Secondary | ICD-10-CM | POA: Diagnosis not present

## 2021-02-13 DIAGNOSIS — C679 Malignant neoplasm of bladder, unspecified: Secondary | ICD-10-CM | POA: Diagnosis not present

## 2021-02-13 DIAGNOSIS — I447 Left bundle-branch block, unspecified: Secondary | ICD-10-CM | POA: Diagnosis not present

## 2021-02-13 DIAGNOSIS — Z950 Presence of cardiac pacemaker: Secondary | ICD-10-CM | POA: Diagnosis not present

## 2021-02-13 DIAGNOSIS — I517 Cardiomegaly: Secondary | ICD-10-CM | POA: Diagnosis not present

## 2021-02-13 DIAGNOSIS — I495 Sick sinus syndrome: Secondary | ICD-10-CM | POA: Diagnosis not present

## 2021-02-13 DIAGNOSIS — Z952 Presence of prosthetic heart valve: Secondary | ICD-10-CM | POA: Diagnosis not present

## 2021-02-13 MED ORDER — DILTIAZEM HCL ER COATED BEADS 180 MG PO CP24
180.0000 mg | ORAL_CAPSULE | Freq: Two times a day (BID) | ORAL | Status: DC
  Administered 2021-02-13: 180 mg via ORAL
  Filled 2021-02-13: qty 1

## 2021-02-13 MED ORDER — DILTIAZEM HCL ER COATED BEADS 180 MG PO CP24
180.0000 mg | ORAL_CAPSULE | Freq: Two times a day (BID) | ORAL | 5 refills | Status: DC
Start: 2021-02-13 — End: 2021-07-29

## 2021-02-13 NOTE — Discharge Summary (Signed)
ELECTROPHYSIOLOGY PROCEDURE DISCHARGE SUMMARY    Patient ID: Jordan Oconnell,  MRN: [id number], DOB/AGE: March 27, 1940 81 y.o.  Admit date: 02/12/2021 Discharge date: 02/13/2021  Primary Care Physician: Burnard Bunting, MD  Primary Cardiologist: Dr. Sallyanne Kuster Electrophysiologist: Dr. [name] Structural: Dr. Burt Knack  Primary Discharge Diagnosis:  Tachycardia bradycardia syndrome  Secondary Discharge Diagnosis:  VHD S/p TAVR CKD (IV, has chronic hyperchloremic metabolic acidosis treated with sodium bicarbonate tablets) LBBB Bladder cancer Paroxysmal Afib CHA2DS2Vasc is 3 Not on a/c 2/2 chronic hematuria   Allergies  Allergen Reactions   Amoxicillin Other (See Comments)    Acute interstitial nephritis   Doxycycline Hives   Flomax [Tamsulosin Hcl] Other (See Comments)    Dizzy      Procedures This Admission:  1.  Implantation of a BSCi dual chamber PPM on 02/12/21 by Dr [name].  The patient received    [facility] MRI EL DR K481, serial [id number] There were no immediate post procedure complications. CXR on 02/13/21 demonstrated no pneumothorax status post device implantation.   Brief HPI: RUE TINNEL is a 81 y.o. male was noted post TAVR a few weeks ago to have asymptomatic post conversion pauses and new LBBB, planned to stop his Flecainide and monitor. He continued to have bradycardia with advanced heart block off AAD/rate limiting agents and planned for [facility]  Hospital Course:  The patient was admitted and underwent implantation of a PPM with details as outlined above.  He was monitored on telemetry overnight which demonstrated PAF w/RVR and AP pacing/sinus rhythm.  Left chest was without hematoma or ecchymosis.  The device was interrogated and found to be functioning normally.  CXR was obtained and demonstrated no pneumothorax status post device implantation.  Wound care, arm mobility, and restrictions were reviewed with the patient.  The patient feels well, denies any  CP/SOB, with minimal site discomfort.  He was examined by Dr. [name] and considered stable for discharge to home.   Follow up is in place, the patient interested in discussing watchman procedure, an appointment with Dr. [name] is in place (early) for further discussion on this.   Physical Exam: Vitals:   02/12/21 1932 02/12/21 2134 02/13/21 0434 02/13/21 0850  BP: 128/74 122/69 139/74 (!) 145/101  Pulse: 72  60 60  Resp: 16  13 15   Temp: 98.2 F (36.8 C)  98.2 F (36.8 C) 97.8 F (36.6 C)  TempSrc: Oral  Oral Oral  SpO2: 92%  92% 92%  Weight:      Height:        GEN- The patient is well appearing, alert and oriented x 3 today.   HEENT: normocephalic, atraumatic; sclera clear, conjunctiva pink; hearing intact; oropharynx clear; neck supple, no JVP Lungs- CTA b/l, normal work of breathing.  No wheezes, rales, rhonchi Heart- RRR, occ extrasystoles, no murmurs, rubs or gallops, PMI not laterally displaced GI- soft, non-tender, non-distended Extremities- no clubbing, cyanosis, or edema MS- no significant deformity or atrophy Skin- warm and dry, no rash or lesion, left chest without hematoma/ecchymosis Psych- euthymic mood, full affect Neuro- no gross deficits   Labs:   Lab Results  Component Value Date   WBC 7.4 02/03/2021   HGB 13.6 02/03/2021   HCT 42.1 02/03/2021   MCV 91 02/03/2021   PLT 153 02/03/2021   No results for input(s): NA, K, CL, CO2, BUN, CREATININE, CALCIUM, PROT, BILITOT, ALKPHOS, ALT, AST, GLUCOSE in the last 168 hours.  Invalid input(s): LABALBU  Discharge Medications:  Allergies as of  02/13/2021       Reactions   Amoxicillin Other (See Comments)   Acute interstitial nephritis   Doxycycline Hives   Flomax [tamsulosin Hcl] Other (See Comments)   Dizzy        Medication List     TAKE these medications    ALPRAZolam 0.25 MG tablet Commonly known as: XANAX Take 0.25 mg by mouth at bedtime.   aspirin 81 MG chewable tablet Chew 1 tablet  (81 mg total) by mouth daily.   azithromycin 500 MG tablet Commonly known as: Zithromax Take 1 tablet by mouth 1 hour before dental procedures and cleanings   diltiazem 180 MG 24 hr capsule Commonly known as: CARDIZEM CD Take 1 capsule (180 mg total) by mouth 2 (two) times daily. What changed: when to take this   escitalopram 10 MG tablet Commonly known as: LEXAPRO Take 10 mg by mouth every morning.   hydrALAZINE 50 MG tablet Commonly known as: APRESOLINE Take 50 mg by mouth 2 (two) times daily.   latanoprost 0.005 % ophthalmic solution Commonly known as: XALATAN Place 1 drop into both eyes at bedtime.   mirtazapine 15 MG tablet Commonly known as: REMERON Take 7.5 mg by mouth at bedtime.   omeprazole 20 MG capsule Commonly known as: PRILOSEC Take 2 capsules (40 mg total) by mouth daily. What changed: how much to take   sodium bicarbonate 650 MG tablet Take 1,300 mg by mouth 2 (two) times daily.               Discharge Care Instructions  (From admission, onward)           Start     Ordered   02/13/21 0000  Discharge wound care:       Comments: As in the AVS   02/13/21 1047            Disposition: home Discharge Instructions     Diet - low sodium heart healthy   Complete by: As directed    Discharge wound care:   Complete by: As directed    As in the AVS   Increase activity slowly   Complete by: As directed         Duration of Discharge Encounter: Greater than 30 minutes including physician time.  Venetia Night, PA-C 02/13/2021 10:58 AM

## 2021-02-16 DIAGNOSIS — I129 Hypertensive chronic kidney disease with stage 1 through stage 4 chronic kidney disease, or unspecified chronic kidney disease: Secondary | ICD-10-CM | POA: Diagnosis not present

## 2021-02-16 DIAGNOSIS — Z0000 Encounter for general adult medical examination without abnormal findings: Secondary | ICD-10-CM | POA: Diagnosis not present

## 2021-02-16 DIAGNOSIS — I480 Paroxysmal atrial fibrillation: Secondary | ICD-10-CM | POA: Diagnosis not present

## 2021-02-16 DIAGNOSIS — C649 Malignant neoplasm of unspecified kidney, except renal pelvis: Secondary | ICD-10-CM | POA: Diagnosis not present

## 2021-02-16 DIAGNOSIS — R7301 Impaired fasting glucose: Secondary | ICD-10-CM | POA: Diagnosis not present

## 2021-02-16 DIAGNOSIS — N184 Chronic kidney disease, stage 4 (severe): Secondary | ICD-10-CM | POA: Diagnosis not present

## 2021-02-16 DIAGNOSIS — I350 Nonrheumatic aortic (valve) stenosis: Secondary | ICD-10-CM | POA: Diagnosis not present

## 2021-02-16 DIAGNOSIS — E785 Hyperlipidemia, unspecified: Secondary | ICD-10-CM | POA: Diagnosis not present

## 2021-02-16 DIAGNOSIS — D692 Other nonthrombocytopenic purpura: Secondary | ICD-10-CM | POA: Diagnosis not present

## 2021-02-19 NOTE — Progress Notes (Signed)
[facility]                                     Cardiology Office Note:    Date:  02/20/2021   ID:  Jordan Oconnell, DOB 1940/11/09, MRN [id number]  PCP:  Burnard Bunting, MD  [facility] Cardiologist:  Sanda Klein, MD / Dr. Burt Knack & Dr. Cyndia Bent (TAVR) [facility] Electrophysiologist:  Vickie Epley, MD   Referring MD: Burnard Bunting, MD   1 month s/p TAVR   History of Present Illness:    Jordan Oconnell is a 81 y.o. male with a hx of extensive urothelial cancer history s/p cystectomy, bilateral ureterectomy in 2015 with ileal conduit followed by left nephrectomy in 2018, paroxysmal atrial fibrillation on Flecanide and diltiazem (not anticoagulated given recurrent hematuria), chronic hyperchloremic metabolic acidosis treated with sodium bicarbonate tablets, CKD stage IV, HTN, HLD severe aortic stenosis s/p TAVR (01/27/21) and tachy-brady s/p PPM (02/12/21) who presents to clinic for follow up.  The patient has been followed for several years with aortic stenosis and only recently has become symptomatic.  He has been physically active over time playing pickle ball and walking. He recently developed some exertional dyspnea and chest pressure. He was referred for sturcurual heart consultation. L/RHC showed nonobstructive coronary artery disease with separate LAD and left circumflex ostia, patent LAD with mild nonobstructive plaquing, severe ostial diagonal stenoses, patent left circumflex with moderate intermediate branch stenosis and no other significant stenoses, patent nondominant RCA.  He was evaluated by the multidisciplinary valve team and underwent successful TAVR with a 29 mm Edwards Sapien 3 Ultra Resilia THV via the TF approach on 01/27/21. Post operative echo showed EF 50-55%, normally functioning TAVR with a mean gradient of 10 mmHg and no PVL. He was discharged on aspirin alone. He developed a new LBBB after TAVR and  converted to atrial fib with asymptomatic 3-4 second pauses. He was seen by Dr. [name] during admission who recommended stopping Flecainide and discharging with a live telemetry monitor.   His montior showed afib with a 6-7 second pauses. His diltiazem was discontinued. He was seen in the office by Dr. [name] and underwent successful implantation of a [place] Sci Accolade PPM on 02/12/21.  Today the patient presents to clinic for follow up.  He is here alone.  He is doing great. No CP or SOB. No LE edema, orthopnea or PND. No dizziness or syncope. No blood in stool or urine. No palpitations.  He has a little bit of tenderness at his pacemaker site.  He would like to switch his care to [facility] urology for convenience.  We will refer him to Dr. Tresa Moore   Past Medical History:  Diagnosis Date   Bladder cancer [facility])    BPH (benign prostatic hyperplasia)    CKD (chronic kidney disease), stage IV (HCC)    Colon polyps    Diverticulosis    GERD (gastroesophageal reflux disease)    Hyperlipidemia    pt denies   Hypertension    PAF (paroxysmal atrial fibrillation) ([place])    on Flecanide and diltiazem. No OAC given recurrent hematuria   Renal cell carcinoma ([place]) 2008   left   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 Burt Knack and Dr. Cyndia Bent   Severe  aortic stenosis 10/05/2020    Past Surgical History:  Procedure Laterality Date   APPENDECTOMY     BLADDER SURGERY  2008   transurethral resection/resection of prostatic urethra   BOWEL RESECTION     ESOPHAGOGASTRODUODENOSCOPY N/A 07/24/2013   Procedure: ESOPHAGOGASTRODUODENOSCOPY (EGD);  Surgeon: [name], MD;  Location: [name];  Service: Endoscopy;  Laterality: N/A;   INTRAOPERATIVE TRANSTHORACIC ECHOCARDIOGRAM N/A 01/27/2021   Procedure: INTRAOPERATIVE TRANSTHORACIC ECHOCARDIOGRAM;  Surgeon: Sherren Mocha, MD;  Location: [facility];  Service:  Open Heart Surgery;  Laterality: N/A;   laparoscopic surgery  2012   laser   (?)   NEPHRECTOMY  2007   partial, left   NEPHROSTOMY  2011   stent   NM MYOCAR PERF WALL MOTION  09/08/2010   Normal   PACEMAKER IMPLANT N/A 02/12/2021   Procedure: PACEMAKER IMPLANT;  Surgeon: Vickie Epley, MD;  Location: [facility];  Service: Cardiovascular;  Laterality: N/A;   RIGHT HEART CATH AND CORONARY ANGIOGRAPHY N/A 10/06/2020   Procedure: RIGHT HEART CATH AND CORONARY ANGIOGRAPHY;  Surgeon: Sherren Mocha, MD;  Location: [facility];  Service: Cardiovascular;  Laterality: N/A;   ROBOT ASSISTED LAPAROSCOPIC COMPLETE CYSTECT ILEAL CONDUIT     TRANSCATHETER AORTIC VALVE REPLACEMENT, TRANSFEMORAL Right 01/27/2021   Procedure: TRANSCATHETER AORTIC VALVE REPLACEMENT, TRANSFEMORAL;  Surgeon: Sherren Mocha, MD;  Location: [facility];  Service: Open Heart Surgery;  Laterality: Right;   US ECHOCARDIOGRAPHY  3/42/8768   mild diastolic dysfunction,mild dilated LA,mild MR,AI,mildly dilated aortic root    Current Medications: Current Meds  Medication Sig   ALPRAZolam (XANAX) 0.25 MG tablet Take 0.25 mg by mouth at bedtime.   aspirin 81 MG chewable tablet Chew 1 tablet (81 mg total) by mouth daily.   azithromycin (ZITHROMAX) 500 MG tablet Take 1 tablet by mouth 1 hour before dental procedures and cleanings   diltiazem (CARDIZEM CD) 180 MG 24 hr capsule Take 1 capsule (180 mg total) by mouth 2 (two) times daily.   escitalopram (LEXAPRO) 10 MG tablet Take 10 mg by mouth every morning.   hydrALAZINE (APRESOLINE) 50 MG tablet Take 50 mg by mouth 2 (two) times daily.   latanoprost (XALATAN) 0.005 % ophthalmic solution Place 1 drop into both eyes at bedtime.   mirtazapine (REMERON) 15 MG tablet Take 7.5 mg by mouth at bedtime.   omeprazole (PRILOSEC) 20 MG capsule Take 20 mg by mouth daily.   sodium bicarbonate 650 MG tablet Take 1,300 mg by mouth 2 (two) times daily.     Allergies:    Amoxicillin, Doxycycline, and Flomax [tamsulosin hcl]   Social History   Socioeconomic History   Marital status: Divorced    Spouse name: Not on file   Number of children: 2   Years of education: Not on file   Highest education level: Not on file  Occupational History   Occupation: Pharmacist, hospital  Tobacco Use   Smoking status: Never   Smokeless tobacco: Never  Vaping Use   Vaping Use: Never used  Substance and Sexual Activity   Alcohol use: Yes    Comment: occasionally   Drug use: No   Sexual activity: Not on file  Other Topics Concern   Not on file  Social History Narrative   Not on file   Social Determinants of Health   Financial Resource Strain: Not on file  Food Insecurity: Not on file  Transportation Needs: Not on file  Physical Activity: Not on file  Stress: Not on file  Social Connections: Not on file     Family History: The patient's family history includes Breast cancer in his daughter; Colon cancer in his brother, father, and mother; Crohn's disease in his son; Ovarian cancer in his mother.  ROS:   Please see the history of present illness.    All other systems reviewed and are negative.  EKGs/Labs/Other Studies Reviewed:    The following studies were reviewed today:  TAVR OPERATIVE NOTE     Date of Procedure:                01/27/2021   Preoperative Diagnosis:      Severe Aortic Stenosis    Postoperative Diagnosis:    Same    Procedure:        Transcatheter Aortic Valve Replacement - Percutaneous Right Transfemoral Approach             Edwards Sapien 3 Ultra Resilia THV (size 29 mm, model # 9755RSL, serial # [id number])              Co-Surgeons:                        Gaye Pollack, MD and Sherren Mocha, MD       Anesthesiologist:                  Jenita Seashore, MD   Echocardiographer:              Edmonia James, MD   Pre-operative Echo Findings: Severe aortic stenosis Normal left ventricular systolic function   Post-operative Echo Findings: No  paravalvular leak Normal left ventricular systolic function   _____________________   Echo 01/28/21:  IMPRESSIONS   1. The aortic valve has been repaired/replaced. There is a 29 mm Edwards  Ultra, stented (TAVR) valve present in the aortic position. Echo findings  are consistent with normal structure and function of the aortic valve  prosthesis. Mean gradient 70mmHg,  Vmax 2.0 m/s, DI 0.52. There is no paravalvular leak.   2. Left ventricular ejection fraction, by estimation, is 50 to 55%. The  left ventricle has low normal function. The left ventricle has no regional  wall motion abnormalities. There is moderate concentric left ventricular  hypertrophy. Left ventricular  diastolic parameters are consistent with Grade II diastolic dysfunction  (pseudonormalization). Elevated left atrial pressure.   3. Right ventricular systolic function is normal. The right ventricular  size is normal.   4. Left atrial size was moderately dilated.   5. The mitral valve is grossly normal. Mild mitral valve regurgitation.  Moderate mitral annular calcification.   6. Aortic dilatation noted. There is mild dilatation of the ascending  aorta, measuring 42 mm.   7. The inferior vena cava is normal in size with greater than 50%  respiratory variability, suggesting right atrial pressure of 3 mmHg.  ____________________________  PACEMAKER IMPLANT   Conclusion   CONCLUSIONS:   1. Symptomatic tachycardia-bradycardia syndrome  2. Dual chamber permanent pacemaker implant with left bundle area led  3.  No early apparent complications.    Procedural Details   SURGEON:  Lars Mage, MD      PREPROCEDURE DIAGNOSES:   1. Tachycardia - bradycardia syndrome     POSTPROCEDURE DIAGNOSES:   1. Tachycardia - bradycardia syndrome      PROCEDURES:    1. Dual chamber permanent pacemaker implant      INTRODUCTION:  Jordan Oconnell is a  81 y.o. with severe AS now s/p TAVR who presents to the EP lab for  PPM implant because of symptomatic tachycardia-bradycardia syndrome.    DESCRIPTION OF PROCEDURE:  Informed written consent was obtained and the patient was brought to the electrophysiology lab in the fasting state. Prophylactic antibiotics were given. The patient was adequately sedated with intravenous Versed, and fentanyl as outlined in the nursing report.  The patient's left chest was prepped and draped in the usual sterile fashion by the EP lab staff.  The skin overlying the left deltopectoral region was infiltrated with lidocaine for local analgesia.  An incision was created over the left deltopectoral region.  A left prepectoral pocket was fashioned using a combination of sharp and blunt dissection.  Electrocautery was used to assure hemostasis.   Lead Placement: The left axillary vein was cannulated using ultrasound guidance.  I first passed the RA Lead to the RV septum to provide temporary pacing support for the duration of the left bundle lead placement. Next, through the left axillary vein a Wholey wire was passed to the RV. Over the wire, a septal sheath was passed to the mid septum. An RV lead (model Ingevity+ [id number], serial [id number]) was passed through the sheath and fixed to the RV septum.  It was advanced until there was evidence of capture of the left sided conduction system (stim-lateral QRS time 37ms). There it displayed excelling pacing (1.4 V at 0.79ms) and sensing thresholds (10.3 mV) with an acceptable impedance (930 ohms). The lead was secured to the pectoral fascia. Next, the right atrial lead (model Ingevity+ [id number], serial [id number]) was moved to the RA appendage. The patient was in atrial fibrillation with and sensing (4.7 mV) thresholds with an acceptable impedance (706 ohms). It was secured to the pectoral fascia. The pocket was irrigated with copious vancomycin solution.  The leads were then  connected to a pulse generator (model Accolade MRI EL DR [id number], serial [id number]). The pocket was  closed in layers of absorbable suture. EBL < 42mL. Steri-strips and a sterile dressing were applied.   During this procedure the patient is administered a total of Versed 2 mg and Fentanyl 50 mcg to achieve and maintain moderate conscious sedation.  The patient's heart rate, blood pressure, and oxygen saturation are monitored continuously during the procedure. I was present face-to-face 100% of the time sedation was administered.  Total sedation time 52 minutes.     CONCLUSIONS:   1. Symptomatic tachycardia-bradycardia syndrome  2. Dual chamber permanent pacemaker implant with left bundle area led  3.  No early apparent complications.   Lysbeth Galas T. [name], MD, [facility] Cardiac Electrophysiology    ___________________________  Echo 02/20/21 IMPRESSIONS   1. Abnormal septal motion . Left ventricular ejection fraction, by  estimation, is 55%. The left ventricle has normal function. The left  ventricle has no regional wall motion abnormalities. Left ventricular  diastolic parameters were normal.   2. Pacing wires in RA/RV. Right ventricular systolic function is normal.  The right ventricular size is normal.   3. Left atrial size was moderately dilated.   4. The mitral valve is abnormal. Mild mitral valve regurgitation. No  evidence of mitral stenosis.   5. Post TAVR with 29 mm Sapien 3 valve no significant PVL gradients have  increased since previous echo . The aortic valve has been  repaired/replaced. Aortic valve regurgitation is not visualized. No aortic  stenosis is present.   6. Aortic dilatation noted. There is moderate dilatation of the ascending  aorta, measuring 43 mm.   7. The inferior vena cava is normal in size with greater than 50%  respiratory variability, suggesting right atrial pressure of 3 mmHg.    EKG:  EKG is NOT ordered today.   Recent Labs: 01/23/2021: ALT 13; B Natriuretic Peptide 720.2 01/28/2021: Magnesium 1.8 02/03/2021: BUN 61; Creatinine, Ser 2.57;  Hemoglobin 13.6; Platelets 153; Potassium 5.0; Sodium 138  Recent Lipid Panel No results found for: CHOL, TRIG, HDL, CHOLHDL, VLDL, LDLCALC, LDLDIRECT   Risk Assessment/Calculations:    CHA2DS2-VASc Score = 4   This indicates a 4.8% annual risk of stroke. The patient's score is based upon: CHF History: 1 HTN History: 1 Diabetes History: 0 Stroke History: 0 Vascular Disease History: 0 Age Score: 2 Gender Score: 0      Physical Exam:    VS:  BP (!) 116/58 (BP Location: Left Arm, Patient Position: Sitting, Cuff Size: Normal)    Pulse 76    Ht 6\' 1"  (1.854 m)    Wt 197 lb 9.6 oz (89.6 kg)    SpO2 98%    BMI 26.07 kg/m     Wt Readings from Last 3 Encounters:  02/20/21 197 lb 9.6 oz (89.6 kg)  02/12/21 199 lb (90.3 kg)  02/06/21 199 lb (90.3 kg)     GEN:  Well nourished, well developed in no acute distress HEENT: Normal NECK: No JVD LYMPHATICS: No lymphadenopathy CARDIAC: irreg irreg,  no murmurs, rubs, gallops RESPIRATORY:  Clear to auscultation without rales, wheezing or rhonchi  ABDOMEN: Soft, non-tender, non-distended MUSCULOSKELETAL:  No edema; No deformity  SKIN: Warm and dry.   NEUROLOGIC:  Alert and oriented x 3 PSYCHIATRIC:  Normal affect   ASSESSMENT:    1. S/P TAVR (transcatheter aortic valve replacement)   2. Pacemaker   3. PAF (paroxysmal atrial fibrillation) ([place])   4. Coronary artery disease involving native heart without angina pectoris, unspecified vessel or lesion type   5. Essential hypertension   6. CKD (chronic kidney disease) stage 4, GFR 15-29 ml/min (HCC)   7. Urothelial cancer ([place])    PLAN:    In order of problems listed above:  Severe AS s/p TAVR: echo today shows EF 55%, normally functioning TAVR with a mean gradient of 15.4 mm hg and no PVL. Gradients are a little more elevated than previous but still in an acceptable range. He has NYHA class I symptoms. SBE prophylaxis discussed; I have RX'd azithromycin due to a PCN allergy. I will  see him back in 1 year with an echo.  Tachy-brady s/p PPM: follow up with Dr. [name]   PAF: not on [facility] due to recurrent hematuria. Could potentially be a [facility] candidate in the future, but needs to stay of [place] until urologic biopsy completed.    CAD: pre TAVR cath showed nonobstructive coronary artery disease with separate LAD and left circumflex ostia, patent LAD with mild nonobstructive plaquing, severe ostial diagonal stenoses, patent left circumflex with moderate intermediate branch stenosis and no other significant stenoses, patent nondominant RCA. Continue medical therapy.    HTN: BP well controlled. No changes made    CKD stage IV: creat 2.57 on last check. This has remained stable.    Urologic disease: he has a history of urothelial cancer history s/p cystectomy, bilateral ureterectomy in 2015 with ileal conduit followed by left nephrectomy in 2018. Pre TAVR CT scans showed a urothelial mass in the right renal pelvis, with additional areas of soft tissue thickening and enhancement within the  right ileal conduit, concerning for recurrent urothelial neoplasm. He is closely followed by Dr. [name] with [facility] who plans for biopsy after TAVR.   He would like to switch his care to [facility] urology for convenience.  We will refer him to Dr. Tresa Moore   Medication Adjustments/Labs and Tests Ordered: Current medicines are reviewed at length with the patient today.  Concerns regarding medicines are outlined above.  Orders Placed This Encounter  Procedures   Ambulatory referral to Urology   ECHOCARDIOGRAM COMPLETE   No orders of the defined types were placed in this encounter.   Patient Instructions  Medication Instructions:  Your physician recommends that you continue on your current medications as directed. Please refer to the Current Medication list given to you today.  *If you need a refill on your cardiac medications before your next appointment, please call your  pharmacy*   Lab Work: None ordered   If you have labs (blood work) drawn today and your tests are completely normal, you will receive your results only by: [place] (if you have MyChart) OR A paper copy in the mail If you have any lab test that is abnormal or we need to change your treatment, we will call you to review the results.   Testing/Procedures: Your physician has requested that you have an echocardiogram in 1 year . Echocardiography is a painless test that uses sound waves to create images of your heart. It provides your doctor with information about the size and shape of your heart and how well your hearts chambers and valves are working. This procedure takes approximately one hour. There are no restrictions for this procedure.    Follow-Up: Follow up as scheduled }   Your physician has referred you to see Dr. Alexis Frock at [facility]    Other Instructions None     Signed, Angelena Form, PA-C  02/20/2021 12:48 PM    [place]

## 2021-02-20 ENCOUNTER — Ambulatory Visit: Payer: Medicare PPO | Admitting: Physician Assistant

## 2021-02-20 ENCOUNTER — Encounter: Payer: Self-pay | Admitting: Physician Assistant

## 2021-02-20 ENCOUNTER — Ambulatory Visit (HOSPITAL_COMMUNITY): Payer: Medicare PPO | Attending: Cardiovascular Disease

## 2021-02-20 ENCOUNTER — Other Ambulatory Visit: Payer: Self-pay

## 2021-02-20 VITALS — BP 116/58 | HR 76 | Ht 73.0 in | Wt 197.6 lb

## 2021-02-20 DIAGNOSIS — Z952 Presence of prosthetic heart valve: Secondary | ICD-10-CM

## 2021-02-20 DIAGNOSIS — I2510 Atherosclerotic heart disease of native coronary artery without angina pectoris: Secondary | ICD-10-CM | POA: Diagnosis not present

## 2021-02-20 DIAGNOSIS — C689 Malignant neoplasm of urinary organ, unspecified: Secondary | ICD-10-CM | POA: Diagnosis not present

## 2021-02-20 DIAGNOSIS — I480 Paroxysmal atrial fibrillation: Secondary | ICD-10-CM

## 2021-02-20 DIAGNOSIS — Z950 Presence of cardiac pacemaker: Secondary | ICD-10-CM | POA: Diagnosis not present

## 2021-02-20 DIAGNOSIS — N184 Chronic kidney disease, stage 4 (severe): Secondary | ICD-10-CM

## 2021-02-20 DIAGNOSIS — I10 Essential (primary) hypertension: Secondary | ICD-10-CM

## 2021-02-20 LAB — ECHOCARDIOGRAM COMPLETE
AR max vel: 1.5 cm2
AV Area VTI: 1.35 cm2
AV Area mean vel: 1.44 cm2
AV Mean grad: 15.4 mmHg
AV Peak grad: 25.4 mmHg
Ao pk vel: 2.52 m/s
Area-P 1/2: 5.12 cm2
S' Lateral: 3.8 cm

## 2021-02-20 NOTE — Patient Instructions (Addendum)
Medication Instructions:  Your physician recommends that you continue on your current medications as directed. Please refer to the Current Medication list given to you today.  *If you need a refill on your cardiac medications before your next appointment, please call your pharmacy*   Lab Work: None ordered   If you have labs (blood work) drawn today and your tests are completely normal, you will receive your results only by: [place] (if you have MyChart) OR A paper copy in the mail If you have any lab test that is abnormal or we need to change your treatment, we will call you to review the results.   Testing/Procedures: Your physician has requested that you have an echocardiogram in 1 year . Echocardiography is a painless test that uses sound waves to create images of your heart. It provides your doctor with information about the size and shape of your heart and how well your hearts chambers and valves are working. This procedure takes approximately one hour. There are no restrictions for this procedure.    Follow-Up: Follow up as scheduled }   Your physician has referred you to see Dr. Alexis Frock at [facility]    Other Instructions None

## 2021-02-24 NOTE — Addendum Note (Signed)
Encounter addended by: Markus Daft A on: 02/24/2021 9:02 AM  Actions taken: Imaging Exam ended

## 2021-02-25 ENCOUNTER — Ambulatory Visit (INDEPENDENT_AMBULATORY_CARE_PROVIDER_SITE_OTHER): Payer: Medicare PPO

## 2021-02-25 ENCOUNTER — Other Ambulatory Visit: Payer: Self-pay

## 2021-02-25 DIAGNOSIS — I495 Sick sinus syndrome: Secondary | ICD-10-CM | POA: Diagnosis not present

## 2021-02-25 LAB — CUP PACEART INCLINIC DEVICE CHECK
Date Time Interrogation Session: 20230201163047
Implantable Lead Implant Date: 20230123
Implantable Lead Implant Date: 20230123
Implantable Lead Location: 753859
Implantable Lead Location: 753860
Implantable Lead Model: 7841
Implantable Lead Model: 7842
Implantable Lead Serial Number: 1101059
Implantable Lead Serial Number: 1169597
Implantable Pulse Generator Implant Date: 20230123
Lead Channel Impedance Value: 526 Ohm
Lead Channel Impedance Value: 666 Ohm
Lead Channel Pacing Threshold Amplitude: 1 V
Lead Channel Pacing Threshold Pulse Width: 0.4 ms
Lead Channel Sensing Intrinsic Amplitude: 24 mV
Lead Channel Sensing Intrinsic Amplitude: 4.3 mV
Lead Channel Setting Pacing Amplitude: 3.5 V
Lead Channel Setting Pacing Amplitude: 3.5 V
Lead Channel Setting Pacing Pulse Width: 0.4 ms
Lead Channel Setting Sensing Sensitivity: 2.5 mV
Pulse Gen Serial Number: 561085

## 2021-02-25 MED ORDER — METOPROLOL SUCCINATE ER 50 MG PO TB24: 25.0000 mg | ORAL_TABLET | Freq: Two times a day (BID) | ORAL | 3 refills | Status: DC

## 2021-02-25 NOTE — Progress Notes (Signed)
Wound check appointment. Steri-strips removed. Wound without redness or edema. Incision edges approximated, wound well healed. Normal device function. RV Thresholds, sensing, and impedances consistent with implant measurements. Unable to measure RA threshold d/t AF. Device programmed at 3.5V/auto capture programmed on for extra safety margin until 3 month visit. Histogram distribution appears blunted with elevated VR's. AT/AF burden 49%. No OAC on file d/t hx of hematuria. Patient educated about wound care, arm mobility, lifting restrictions. ROV in 3 months with implanting physician. Dr. [name] made aware.  BP checked d/t AF/ RVR intermittent.  BP: 121/81.  HR: AF/VS 90-140  Reviewed with Dr. [name], Verbal ordered obtained to start TOPROL-XL 25 mg BID and f/u with EP APP in 4 weeks for titration. Script sent to pharmacy. Next apt with A. Chalmers Cater, PA-C 03/25/21 @ 11:20.

## 2021-02-25 NOTE — Patient Instructions (Signed)

## 2021-03-12 DIAGNOSIS — K432 Incisional hernia without obstruction or gangrene: Secondary | ICD-10-CM | POA: Diagnosis not present

## 2021-03-12 DIAGNOSIS — Z905 Acquired absence of kidney: Secondary | ICD-10-CM | POA: Diagnosis not present

## 2021-03-12 DIAGNOSIS — C651 Malignant neoplasm of right renal pelvis: Secondary | ICD-10-CM | POA: Diagnosis not present

## 2021-03-12 DIAGNOSIS — C678 Malignant neoplasm of overlapping sites of bladder: Secondary | ICD-10-CM | POA: Diagnosis not present

## 2021-03-16 ENCOUNTER — Telehealth: Payer: Self-pay | Admitting: Cardiovascular Disease

## 2021-03-16 ENCOUNTER — Other Ambulatory Visit: Payer: Self-pay | Admitting: Urology

## 2021-03-16 NOTE — Telephone Encounter (Signed)
° °  Pre-operative Risk Assessment    Patient Name: Jordan Oconnell  DOB: 08/10/1940 MRN: [id number]      Request for Surgical Clearance    Procedure:   Ureteral biopsy and tumor ablation   Date of Surgery:  Clearance 04/10/21                                 Surgeon:  Dr. Alexis Frock Surgeon's Group or Practice Name:  [facility]  Phone number:  [phone] Fax number:  [phone]   Type of Clearance Requested:   - Medical  - Pharmacy:  Hold Aspirin 5 days prior   Type of Anesthesia:  General    Additional requests/questions:    Crist Infante   03/16/2021, 4:17 PM

## 2021-03-17 NOTE — Telephone Encounter (Signed)
° °  Name:  Jordan Oconnell  DOB:  28-Oct-1940  MRN:  [id number]   Primary Cardiologist: Sanda Klein, MD  Chart reviewed as part of pre-operative protocol coverage. Patient was contacted 03/17/2021 in reference to pre-operative risk assessment for pending surgery as outlined below. Patient states that he is canceling his ureteral biopsy and tumor ablation scheduled with Dr. Alexis Frock on 04/10/2021.  Patient states he plans to follow-up with [facility]. No need for surgical clearance at this time. I advised patient that should he need surgery in the future to have the requesting provider send an updated surgical clearance request at that time.  Of note, patient has questions regarding upcoming OV.  He states he has an appointment scheduled on 03/23/2021 with Dr. [name] for EP follow-up.  However, I do not see this visit listed. I do see an appointment scheduled with Shirley Friar, PA on 03/25/21. I will ask our staff to reach out to patient to clarify appointment times.  I will contact requesting surgeon's office via preferred method (i.e, phone, fax) to inform them and remove this message from the preop box.  Lenna Sciara, NP 03/17/2021, 4:16 PM

## 2021-03-17 NOTE — Telephone Encounter (Signed)
He is cleared to hold aspirin for 5 days prior to procedure. Thank you for taking care of him.

## 2021-03-17 NOTE — Telephone Encounter (Signed)
Appointment verified with pt. Pt verified understanding and had no additional questions.

## 2021-03-17 NOTE — Telephone Encounter (Signed)
° °  Patient Name: Jordan Oconnell  DOB: 07/05/1940 MRN: [id number]  Primary Cardiologist: Sanda Klein, MD  Chart reviewed as part of pre-operative protocol coverage. Pt has a history of extensive urothelial cancer history s/p cystectomy, bilateral ureterectomy in 2015 with ileal conduit followed by left nephrectomy in 2018, paroxysmal atrial fibrillation on Flecanide and diltiazem (not anticoagulated given recurrent hematuria), chronic hyperchloremic metabolic acidosis treated with sodium bicarbonate tablets, CKD stage IV, HTN, HLD severe aortic stenosis s/p TAVR (01/27/21) and tachy-brady s/p PPM (02/12/21).  He was last seen in office by Angelena Form, PA on 02/20/21 and was stable from a cardiac standpoint. He was referred to Dr. Tresa Moore for management of ongoing urologic disease. He is closely followed by Dr. [name] with [facility] as well. Request for surgical clearance received from Dr. Tresa Moore for ureteral biopsy and tumor ablation, scheduled for 04/10/21, with request to hold Aspirin for 5 days prior to procedure.   I will route this information to Angelena Form, PA, with the structural heart team for further recommendations as pt is recently s/p TAVR.   Lenna Sciara, NP 03/17/2021, 11:52 AM

## 2021-03-18 ENCOUNTER — Other Ambulatory Visit (HOSPITAL_COMMUNITY): Payer: Self-pay | Admitting: Urology

## 2021-03-18 DIAGNOSIS — C678 Malignant neoplasm of overlapping sites of bladder: Secondary | ICD-10-CM | POA: Diagnosis not present

## 2021-03-18 DIAGNOSIS — C651 Malignant neoplasm of right renal pelvis: Secondary | ICD-10-CM

## 2021-03-18 DIAGNOSIS — C661 Malignant neoplasm of right ureter: Secondary | ICD-10-CM | POA: Diagnosis not present

## 2021-03-23 ENCOUNTER — Encounter: Payer: Medicare PPO | Admitting: Cardiology

## 2021-03-25 ENCOUNTER — Encounter: Payer: Self-pay | Admitting: Student

## 2021-03-25 ENCOUNTER — Ambulatory Visit: Payer: Medicare PPO | Admitting: Student

## 2021-03-25 ENCOUNTER — Other Ambulatory Visit: Payer: Self-pay

## 2021-03-25 VITALS — BP 118/69 | HR 60 | Ht 73.0 in | Wt 204.0 lb

## 2021-03-25 DIAGNOSIS — I495 Sick sinus syndrome: Secondary | ICD-10-CM

## 2021-03-25 DIAGNOSIS — I4819 Other persistent atrial fibrillation: Secondary | ICD-10-CM | POA: Diagnosis not present

## 2021-03-25 DIAGNOSIS — I10 Essential (primary) hypertension: Secondary | ICD-10-CM | POA: Diagnosis not present

## 2021-03-25 DIAGNOSIS — I350 Nonrheumatic aortic (valve) stenosis: Secondary | ICD-10-CM

## 2021-03-25 LAB — CUP PACEART INCLINIC DEVICE CHECK
Date Time Interrogation Session: 20230301122120
Implantable Lead Implant Date: 20230123
Implantable Lead Implant Date: 20230123
Implantable Lead Location: 753859
Implantable Lead Location: 753860
Implantable Lead Model: 7841
Implantable Lead Model: 7842
Implantable Lead Serial Number: 1101059
Implantable Lead Serial Number: 1169597
Implantable Pulse Generator Implant Date: 20230123
Lead Channel Impedance Value: 559 Ohm
Lead Channel Impedance Value: 659 Ohm
Lead Channel Pacing Threshold Amplitude: 0.7 V
Lead Channel Pacing Threshold Pulse Width: 0.4 ms
Lead Channel Sensing Intrinsic Amplitude: 2.2 mV
Lead Channel Sensing Intrinsic Amplitude: 24 mV
Lead Channel Setting Pacing Amplitude: 3.5 V
Lead Channel Setting Pacing Amplitude: 3.5 V
Lead Channel Setting Pacing Pulse Width: 0.4 ms
Lead Channel Setting Sensing Sensitivity: 2.5 mV
Pulse Gen Serial Number: 561085

## 2021-03-25 NOTE — Patient Instructions (Signed)
Medication Instructions:  ?Your physician recommends that you continue on your current medications as directed. Please refer to the Current Medication list given to you today. ? ?*If you need a refill on your cardiac medications before your next appointment, please call your pharmacy* ? ? ?Lab Work: ?None  ?If you have labs (blood work) drawn today and your tests are completely normal, you will receive your results only by: ?MyChart Message (if you have MyChart) OR ?A paper copy in the mail ?If you have any lab test that is abnormal or we need to change your treatment, we will call you to review the results. ? ? ?Follow-Up: ?At [facility], you and your health needs are our priority.  As part of our continuing mission to provide you with exceptional heart care, we have created designated Provider Care Teams.  These Care Teams include your primary Cardiologist (physician) and Advanced Practice Providers (APPs -  Physician Assistants and Nurse Practitioners) who all work together to provide you with the care you need, when you need it. ? ?We recommend signing up for the patient portal called "MyChart".  Sign up information is provided on this After Visit Summary.  MyChart is used to connect with patients for Virtual Visits (Telemedicine).  Patients are able to view lab/test results, encounter notes, upcoming appointments, etc.  Non-urgent messages can be sent to your provider as well.   ?To learn more about what you can do with MyChart, go to [email or web address].   ? ?Your next appointment:   ?As scheduled ?

## 2021-03-25 NOTE — Progress Notes (Signed)
Electrophysiology Office Note Date: 03/25/2021  ID:  Osby, Sweetin Mar 13, 1940, MRN [id number]  PCP: Burnard Bunting, MD Primary Cardiologist: Sanda Klein, MD Electrophysiologist: Vickie Epley, MD   CC: Pacemaker follow-up  Jordan Oconnell is a 81 y.o. male seen today for Vickie Epley, MD for routine electrophysiology followup.  Since last being seen in our clinic the patient reports doing very well. He walks 3 miles most days without any issue. He is not aware when he is in AF. We reviewed reports of hematuria in the past. It appears this was way back in 2016, and related to malignancy. He has had +FOBT around that time.  he denies chest pain, palpitations, dyspnea, PND, orthopnea, nausea, vomiting, dizziness, syncope, edema, weight gain, or early satiety.  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 Burt Knack and Dr. Cyndia Bent   Severe aortic stenosis 10/05/2020   Past Surgical History:  Procedure Laterality Date   APPENDECTOMY     BLADDER SURGERY  2008   transurethral resection/resection of prostatic urethra   BOWEL RESECTION     ESOPHAGOGASTRODUODENOSCOPY N/A 07/24/2013   Procedure: ESOPHAGOGASTRODUODENOSCOPY (EGD);  Surgeon: [name], MD;  Location: [name];  Service: Endoscopy;  Laterality: N/A;   INTRAOPERATIVE TRANSTHORACIC ECHOCARDIOGRAM N/A 01/27/2021    Procedure: INTRAOPERATIVE TRANSTHORACIC ECHOCARDIOGRAM;  Surgeon: Sherren Mocha, MD;  Location: [facility];  Service: Open Heart Surgery;  Laterality: N/A;   laparoscopic surgery  2012   laser   (?)   NEPHRECTOMY  2007   partial, left   NEPHROSTOMY  2011   stent   NM MYOCAR PERF WALL MOTION  09/08/2010   Normal   PACEMAKER IMPLANT N/A 02/12/2021   Procedure: PACEMAKER IMPLANT;  Surgeon: Vickie Epley, MD;  Location: [facility];  Service: Cardiovascular;  Laterality: N/A;   RIGHT HEART CATH AND CORONARY ANGIOGRAPHY N/A 10/06/2020   Procedure: RIGHT HEART CATH AND CORONARY ANGIOGRAPHY;  Surgeon: Sherren Mocha, MD;  Location: [facility];  Service: Cardiovascular;  Laterality: N/A;   ROBOT ASSISTED LAPAROSCOPIC COMPLETE CYSTECT ILEAL CONDUIT     TRANSCATHETER AORTIC VALVE REPLACEMENT, TRANSFEMORAL Right 01/27/2021   Procedure: TRANSCATHETER AORTIC VALVE REPLACEMENT, TRANSFEMORAL;  Surgeon: Sherren Mocha, MD;  Location: [facility];  Service: Open Heart Surgery;  Laterality: Right;   US ECHOCARDIOGRAPHY  0/03/4915   mild diastolic dysfunction,mild dilated LA,mild MR,AI,mildly dilated aortic root    Current Outpatient Medications  Medication Sig Dispense Refill   ALPRAZolam (XANAX) 0.25 MG tablet Take 0.25 mg by mouth at bedtime.     aspirin 81 MG chewable tablet Chew 1 tablet (81 mg total) by mouth daily.     azithromycin (ZITHROMAX) 500 MG tablet Take 1 tablet by mouth 1 hour before dental procedures and cleanings 12 tablet 6  diltiazem (CARDIZEM CD) 180 MG 24 hr capsule Take 1 capsule (180 mg total) by mouth 2 (two) times daily. 60 capsule 5   escitalopram (LEXAPRO) 10 MG tablet Take 10 mg by mouth every morning.     hydrALAZINE (APRESOLINE) 50 MG tablet Take 50 mg by mouth 2 (two) times daily.     latanoprost (XALATAN) 0.005 % ophthalmic solution Place 1 drop into both eyes at bedtime.     metoprolol succinate (TOPROL-XL) 50 MG 24 hr tablet Take 0.5  tablets (25 mg total) by mouth in the morning and at bedtime. Take with or immediately following a meal. 90 tablet 3   mirtazapine (REMERON) 15 MG tablet Take 7.5 mg by mouth at bedtime.     omeprazole (PRILOSEC) 20 MG capsule Take 20 mg by mouth daily.     sodium bicarbonate 650 MG tablet Take 1,300 mg by mouth 2 (two) times daily.     No current facility-administered medications for this visit.    Allergies:   Amoxicillin, Doxycycline, and Flomax [tamsulosin hcl]   Social History: Social History   Socioeconomic History   Marital status: Divorced    Spouse name: Not on file   Number of children: 2   Years of education: Not on file   Highest education level: Not on file  Occupational History   Occupation: Pharmacist, hospital  Tobacco Use   Smoking status: Never   Smokeless tobacco: Never  Vaping Use   Vaping Use: Never used  Substance and Sexual Activity   Alcohol use: Yes    Comment: occasionally   Drug use: No   Sexual activity: Not on file  Other Topics Concern   Not on file  Social History Narrative   Not on file   Social Determinants of Health   Financial Resource Strain: Not on file  Food Insecurity: Not on file  Transportation Needs: Not on file  Physical Activity: Not on file  Stress: Not on file  Social Connections: Not on file  Intimate Partner Violence: Not on file    Family History: Family History  Problem Relation Age of Onset   Ovarian cancer Mother    Colon cancer Mother    Colon cancer Father    Crohn's disease Son    Breast cancer Daughter    Colon cancer Brother      Review of Systems: All other systems reviewed and are otherwise negative except as noted above.  Physical Exam: Vitals:   03/25/21 1121  BP: 118/69  Pulse: 60  SpO2: 97%  Weight: 204 lb (92.5 kg)  Height: 6\' 1"  (1.854 m)     GEN- The patient is well appearing, alert and oriented x 3 today.   HEENT: normocephalic, atraumatic; sclera clear, conjunctiva pink; hearing intact;  oropharynx clear; neck supple  Lungs- Clear to ausculation bilaterally, normal work of breathing.  No wheezes, rales, rhonchi Heart- Regular rate and rhythm, no murmurs, rubs or gallops  GI- soft, non-tender, non-distended, bowel sounds present  Extremities- no clubbing or cyanosis. No edema MS- no significant deformity or atrophy Skin- warm and dry, no rash or lesion; PPM pocket well healed Psych- euthymic mood, full affect Neuro- strength and sensation are intact  PPM Interrogation- reviewed in detail today,  See PACEART report  EKG:  EKG is not ordered today.  Recent Labs: 01/23/2021: ALT 13; B Natriuretic Peptide 720.2 01/28/2021: Magnesium 1.8 02/03/2021: BUN 61; Creatinine, Ser 2.57; Hemoglobin 13.6; Platelets 153; Potassium 5.0; Sodium 138   Wt Readings from Last  3 Encounters:  03/25/21 204 lb (92.5 kg)  02/20/21 197 lb 9.6 oz (89.6 kg)  02/12/21 199 lb (90.3 kg)     Other studies Reviewed: Additional studies/ records that were reviewed today include: Previous EP office notes, Previous remote checks, Most recent labwork.   Assessment and Plan:  1. Tachy-Brady syndrome s/p [employer] PPM  Normal PPM function See Claudia Desanctis Art report No changes today  2. Paroxysmal atrial fibrillation Burden 18%, but undersensing noted. By Histograms, > 40%.  Looking back at the chart, reports of hematuria appear relatively solitary around 2016.  We discussed CVA risk and CHA2DS2VASC is of at least 4.    He is interesting in hearing more about [facility], but at this time he would prefer to avoid [place].  I reviewed that if he IS a candidate, he would need to on [facility] for a time before and after watchman; and deferring could delay consideration. He verbalized understanding.  He is also pending Nephrostomy at [facility], and he and son would prefer to not start/stop in this setting.  I offered amiodarone, noting potential risk for CVA in the setting of unclear time frame/burden and that I would prefer  to do this ON [place].   He declines amiodarone outright, as he is currently asymptomatic.  Continue Toprol 25 mg BID Not candidate for flecainide with structural heart disease. Not tikosyn candidate with CKD He verbalized understanding of stroke off of [place]. We discussed the mechanism by which AF puts him at risk for this.    3. Severe aortic stenosis post TAVR Prosthesis functioning appropriately.  Recovering well from his recent TAVR.  Current medicines are reviewed at length with the patient today.    Labs/ tests ordered today include:  Orders Placed This Encounter  Procedures   CUP PACEART INCLINIC DEVICE CHECK   Disposition:   Follow up with Dr. [name] in  as usual post implant.    Jacalyn Lefevre, PA-C  03/25/2021 11:34 AM  [facility] [address] [place] [address] [phone] (office) [phone] (fax)

## 2021-03-31 ENCOUNTER — Encounter (HOSPITAL_COMMUNITY): Payer: Medicare PPO

## 2021-04-09 DIAGNOSIS — Z85828 Personal history of other malignant neoplasm of skin: Secondary | ICD-10-CM | POA: Diagnosis not present

## 2021-04-09 DIAGNOSIS — L821 Other seborrheic keratosis: Secondary | ICD-10-CM | POA: Diagnosis not present

## 2021-04-09 DIAGNOSIS — L578 Other skin changes due to chronic exposure to nonionizing radiation: Secondary | ICD-10-CM | POA: Diagnosis not present

## 2021-04-09 DIAGNOSIS — D1801 Hemangioma of skin and subcutaneous tissue: Secondary | ICD-10-CM | POA: Diagnosis not present

## 2021-04-09 DIAGNOSIS — L570 Actinic keratosis: Secondary | ICD-10-CM | POA: Diagnosis not present

## 2021-04-10 ENCOUNTER — Ambulatory Visit: Admit: 2021-04-10 | Payer: Medicare PPO | Admitting: Urology

## 2021-04-10 DIAGNOSIS — Z20822 Contact with and (suspected) exposure to covid-19: Secondary | ICD-10-CM | POA: Diagnosis not present

## 2021-04-10 SURGERY — URETEROSCOPY, ANTEGRADE
Anesthesia: General | Laterality: Right

## 2021-04-13 DIAGNOSIS — Z8551 Personal history of malignant neoplasm of bladder: Secondary | ICD-10-CM | POA: Diagnosis not present

## 2021-04-13 DIAGNOSIS — K219 Gastro-esophageal reflux disease without esophagitis: Secondary | ICD-10-CM | POA: Diagnosis not present

## 2021-04-13 DIAGNOSIS — I129 Hypertensive chronic kidney disease with stage 1 through stage 4 chronic kidney disease, or unspecified chronic kidney disease: Secondary | ICD-10-CM | POA: Diagnosis not present

## 2021-04-13 DIAGNOSIS — N184 Chronic kidney disease, stage 4 (severe): Secondary | ICD-10-CM | POA: Diagnosis not present

## 2021-04-13 DIAGNOSIS — I480 Paroxysmal atrial fibrillation: Secondary | ICD-10-CM | POA: Diagnosis not present

## 2021-04-13 DIAGNOSIS — Z800 Family history of malignant neoplasm of digestive organs: Secondary | ICD-10-CM | POA: Diagnosis not present

## 2021-04-13 DIAGNOSIS — C651 Malignant neoplasm of right renal pelvis: Secondary | ICD-10-CM | POA: Diagnosis not present

## 2021-04-13 DIAGNOSIS — N1330 Unspecified hydronephrosis: Secondary | ICD-10-CM | POA: Diagnosis not present

## 2021-04-13 DIAGNOSIS — C661 Malignant neoplasm of right ureter: Secondary | ICD-10-CM | POA: Diagnosis not present

## 2021-04-14 DIAGNOSIS — I129 Hypertensive chronic kidney disease with stage 1 through stage 4 chronic kidney disease, or unspecified chronic kidney disease: Secondary | ICD-10-CM | POA: Diagnosis not present

## 2021-04-14 DIAGNOSIS — N1330 Unspecified hydronephrosis: Secondary | ICD-10-CM | POA: Diagnosis not present

## 2021-04-14 DIAGNOSIS — K219 Gastro-esophageal reflux disease without esophagitis: Secondary | ICD-10-CM | POA: Diagnosis not present

## 2021-04-14 DIAGNOSIS — N184 Chronic kidney disease, stage 4 (severe): Secondary | ICD-10-CM | POA: Diagnosis not present

## 2021-04-14 DIAGNOSIS — J90 Pleural effusion, not elsewhere classified: Secondary | ICD-10-CM | POA: Diagnosis not present

## 2021-04-14 DIAGNOSIS — C651 Malignant neoplasm of right renal pelvis: Secondary | ICD-10-CM | POA: Diagnosis not present

## 2021-04-14 DIAGNOSIS — I480 Paroxysmal atrial fibrillation: Secondary | ICD-10-CM | POA: Diagnosis not present

## 2021-04-14 DIAGNOSIS — C661 Malignant neoplasm of right ureter: Secondary | ICD-10-CM | POA: Diagnosis not present

## 2021-04-14 DIAGNOSIS — Z8551 Personal history of malignant neoplasm of bladder: Secondary | ICD-10-CM | POA: Diagnosis not present

## 2021-04-14 DIAGNOSIS — C679 Malignant neoplasm of bladder, unspecified: Secondary | ICD-10-CM | POA: Diagnosis not present

## 2021-04-14 DIAGNOSIS — Z800 Family history of malignant neoplasm of digestive organs: Secondary | ICD-10-CM | POA: Diagnosis not present

## 2021-04-15 DIAGNOSIS — I480 Paroxysmal atrial fibrillation: Secondary | ICD-10-CM | POA: Diagnosis not present

## 2021-04-15 DIAGNOSIS — Z906 Acquired absence of other parts of urinary tract: Secondary | ICD-10-CM | POA: Diagnosis not present

## 2021-04-15 DIAGNOSIS — Z8551 Personal history of malignant neoplasm of bladder: Secondary | ICD-10-CM | POA: Diagnosis not present

## 2021-04-15 DIAGNOSIS — Z8550 Personal history of malignant neoplasm of unspecified urinary tract organ: Secondary | ICD-10-CM | POA: Diagnosis not present

## 2021-04-15 DIAGNOSIS — Z905 Acquired absence of kidney: Secondary | ICD-10-CM | POA: Diagnosis not present

## 2021-04-15 DIAGNOSIS — C651 Malignant neoplasm of right renal pelvis: Secondary | ICD-10-CM | POA: Diagnosis not present

## 2021-04-15 DIAGNOSIS — Z800 Family history of malignant neoplasm of digestive organs: Secondary | ICD-10-CM | POA: Diagnosis not present

## 2021-04-15 DIAGNOSIS — N184 Chronic kidney disease, stage 4 (severe): Secondary | ICD-10-CM | POA: Diagnosis not present

## 2021-04-15 DIAGNOSIS — C661 Malignant neoplasm of right ureter: Secondary | ICD-10-CM | POA: Diagnosis not present

## 2021-04-15 DIAGNOSIS — I129 Hypertensive chronic kidney disease with stage 1 through stage 4 chronic kidney disease, or unspecified chronic kidney disease: Secondary | ICD-10-CM | POA: Diagnosis not present

## 2021-04-15 DIAGNOSIS — K219 Gastro-esophageal reflux disease without esophagitis: Secondary | ICD-10-CM | POA: Diagnosis not present

## 2021-04-15 DIAGNOSIS — N1330 Unspecified hydronephrosis: Secondary | ICD-10-CM | POA: Diagnosis not present

## 2021-04-16 DIAGNOSIS — Z45018 Encounter for adjustment and management of other part of cardiac pacemaker: Secondary | ICD-10-CM | POA: Diagnosis not present

## 2021-04-16 DIAGNOSIS — I129 Hypertensive chronic kidney disease with stage 1 through stage 4 chronic kidney disease, or unspecified chronic kidney disease: Secondary | ICD-10-CM | POA: Diagnosis not present

## 2021-04-16 DIAGNOSIS — N200 Calculus of kidney: Secondary | ICD-10-CM | POA: Diagnosis not present

## 2021-04-16 DIAGNOSIS — I517 Cardiomegaly: Secondary | ICD-10-CM | POA: Diagnosis not present

## 2021-04-16 DIAGNOSIS — Z8551 Personal history of malignant neoplasm of bladder: Secondary | ICD-10-CM | POA: Diagnosis not present

## 2021-04-16 DIAGNOSIS — I480 Paroxysmal atrial fibrillation: Secondary | ICD-10-CM | POA: Diagnosis not present

## 2021-04-16 DIAGNOSIS — C661 Malignant neoplasm of right ureter: Secondary | ICD-10-CM | POA: Diagnosis not present

## 2021-04-16 DIAGNOSIS — Z800 Family history of malignant neoplasm of digestive organs: Secondary | ICD-10-CM | POA: Diagnosis not present

## 2021-04-16 DIAGNOSIS — N1330 Unspecified hydronephrosis: Secondary | ICD-10-CM | POA: Diagnosis not present

## 2021-04-16 DIAGNOSIS — K219 Gastro-esophageal reflux disease without esophagitis: Secondary | ICD-10-CM | POA: Diagnosis not present

## 2021-04-16 DIAGNOSIS — N184 Chronic kidney disease, stage 4 (severe): Secondary | ICD-10-CM | POA: Diagnosis not present

## 2021-04-16 DIAGNOSIS — N132 Hydronephrosis with renal and ureteral calculous obstruction: Secondary | ICD-10-CM | POA: Diagnosis not present

## 2021-04-16 DIAGNOSIS — C651 Malignant neoplasm of right renal pelvis: Secondary | ICD-10-CM | POA: Diagnosis not present

## 2021-04-16 DIAGNOSIS — Z952 Presence of prosthetic heart valve: Secondary | ICD-10-CM | POA: Diagnosis not present

## 2021-04-17 DIAGNOSIS — C661 Malignant neoplasm of right ureter: Secondary | ICD-10-CM | POA: Diagnosis not present

## 2021-04-17 DIAGNOSIS — Z800 Family history of malignant neoplasm of digestive organs: Secondary | ICD-10-CM | POA: Diagnosis not present

## 2021-04-17 DIAGNOSIS — N200 Calculus of kidney: Secondary | ICD-10-CM | POA: Diagnosis not present

## 2021-04-17 DIAGNOSIS — I129 Hypertensive chronic kidney disease with stage 1 through stage 4 chronic kidney disease, or unspecified chronic kidney disease: Secondary | ICD-10-CM | POA: Diagnosis not present

## 2021-04-17 DIAGNOSIS — N184 Chronic kidney disease, stage 4 (severe): Secondary | ICD-10-CM | POA: Diagnosis not present

## 2021-04-17 DIAGNOSIS — K219 Gastro-esophageal reflux disease without esophagitis: Secondary | ICD-10-CM | POA: Diagnosis not present

## 2021-04-17 DIAGNOSIS — C651 Malignant neoplasm of right renal pelvis: Secondary | ICD-10-CM | POA: Diagnosis not present

## 2021-04-17 DIAGNOSIS — I480 Paroxysmal atrial fibrillation: Secondary | ICD-10-CM | POA: Diagnosis not present

## 2021-04-17 DIAGNOSIS — N1330 Unspecified hydronephrosis: Secondary | ICD-10-CM | POA: Diagnosis not present

## 2021-04-17 DIAGNOSIS — Z8551 Personal history of malignant neoplasm of bladder: Secondary | ICD-10-CM | POA: Diagnosis not present

## 2021-04-29 DIAGNOSIS — N184 Chronic kidney disease, stage 4 (severe): Secondary | ICD-10-CM | POA: Diagnosis not present

## 2021-04-29 DIAGNOSIS — C642 Malignant neoplasm of left kidney, except renal pelvis: Secondary | ICD-10-CM | POA: Diagnosis not present

## 2021-04-29 DIAGNOSIS — C661 Malignant neoplasm of right ureter: Secondary | ICD-10-CM | POA: Diagnosis not present

## 2021-04-29 DIAGNOSIS — C678 Malignant neoplasm of overlapping sites of bladder: Secondary | ICD-10-CM | POA: Diagnosis not present

## 2021-04-29 DIAGNOSIS — C662 Malignant neoplasm of left ureter: Secondary | ICD-10-CM | POA: Diagnosis not present

## 2021-05-20 ENCOUNTER — Encounter: Payer: Medicare PPO | Admitting: Cardiology

## 2021-05-26 ENCOUNTER — Ambulatory Visit (INDEPENDENT_AMBULATORY_CARE_PROVIDER_SITE_OTHER): Payer: Medicare PPO

## 2021-05-26 DIAGNOSIS — Z905 Acquired absence of kidney: Secondary | ICD-10-CM | POA: Diagnosis not present

## 2021-05-26 DIAGNOSIS — C661 Malignant neoplasm of right ureter: Secondary | ICD-10-CM | POA: Diagnosis not present

## 2021-05-26 DIAGNOSIS — I10 Essential (primary) hypertension: Secondary | ICD-10-CM | POA: Diagnosis not present

## 2021-05-26 DIAGNOSIS — K219 Gastro-esophageal reflux disease without esophagitis: Secondary | ICD-10-CM | POA: Diagnosis not present

## 2021-05-26 DIAGNOSIS — I129 Hypertensive chronic kidney disease with stage 1 through stage 4 chronic kidney disease, or unspecified chronic kidney disease: Secondary | ICD-10-CM | POA: Diagnosis not present

## 2021-05-26 DIAGNOSIS — N184 Chronic kidney disease, stage 4 (severe): Secondary | ICD-10-CM | POA: Diagnosis not present

## 2021-05-26 DIAGNOSIS — I4891 Unspecified atrial fibrillation: Secondary | ICD-10-CM | POA: Diagnosis not present

## 2021-05-26 DIAGNOSIS — I495 Sick sinus syndrome: Secondary | ICD-10-CM

## 2021-05-26 DIAGNOSIS — Z79899 Other long term (current) drug therapy: Secondary | ICD-10-CM | POA: Diagnosis not present

## 2021-05-26 DIAGNOSIS — C678 Malignant neoplasm of overlapping sites of bladder: Secondary | ICD-10-CM | POA: Diagnosis not present

## 2021-05-26 DIAGNOSIS — N1330 Unspecified hydronephrosis: Secondary | ICD-10-CM | POA: Diagnosis not present

## 2021-05-26 LAB — CUP PACEART REMOTE DEVICE CHECK
Battery Remaining Longevity: 132 mo
Battery Remaining Percentage: 100 %
Brady Statistic RA Percent Paced: 11 %
Brady Statistic RV Percent Paced: 66 %
Date Time Interrogation Session: 20230430131500
Implantable Lead Implant Date: 20230123
Implantable Lead Implant Date: 20230123
Implantable Lead Location: 753859
Implantable Lead Location: 753860
Implantable Lead Model: 7841
Implantable Lead Model: 7842
Implantable Lead Serial Number: 1101059
Implantable Lead Serial Number: 1169597
Implantable Pulse Generator Implant Date: 20230123
Lead Channel Impedance Value: 636 Ohm
Lead Channel Impedance Value: 674 Ohm
Lead Channel Pacing Threshold Amplitude: 1 V
Lead Channel Pacing Threshold Pulse Width: 0.4 ms
Lead Channel Setting Pacing Amplitude: 3.5 V
Lead Channel Setting Pacing Amplitude: 3.5 V
Lead Channel Setting Pacing Pulse Width: 0.4 ms
Lead Channel Setting Sensing Sensitivity: 2.5 mV
Pulse Gen Serial Number: 561085

## 2021-06-09 DIAGNOSIS — C661 Malignant neoplasm of right ureter: Secondary | ICD-10-CM | POA: Diagnosis not present

## 2021-06-09 DIAGNOSIS — Z5112 Encounter for antineoplastic immunotherapy: Secondary | ICD-10-CM | POA: Diagnosis not present

## 2021-06-10 NOTE — Progress Notes (Signed)
Remote pacemaker transmission.   

## 2021-06-11 DIAGNOSIS — H9212 Otorrhea, left ear: Secondary | ICD-10-CM | POA: Diagnosis not present

## 2021-06-16 ENCOUNTER — Encounter: Payer: Self-pay | Admitting: Cardiology

## 2021-06-16 ENCOUNTER — Ambulatory Visit: Payer: Medicare PPO | Admitting: Cardiology

## 2021-06-16 VITALS — BP 110/72 | HR 86 | Ht 73.0 in

## 2021-06-16 DIAGNOSIS — C689 Malignant neoplasm of urinary organ, unspecified: Secondary | ICD-10-CM | POA: Diagnosis not present

## 2021-06-16 DIAGNOSIS — Z45010 Encounter for checking and testing of cardiac pacemaker pulse generator [battery]: Secondary | ICD-10-CM

## 2021-06-16 DIAGNOSIS — I495 Sick sinus syndrome: Secondary | ICD-10-CM

## 2021-06-16 DIAGNOSIS — Z950 Presence of cardiac pacemaker: Secondary | ICD-10-CM | POA: Diagnosis not present

## 2021-06-16 DIAGNOSIS — I480 Paroxysmal atrial fibrillation: Secondary | ICD-10-CM | POA: Diagnosis not present

## 2021-06-16 DIAGNOSIS — Z952 Presence of prosthetic heart valve: Secondary | ICD-10-CM | POA: Diagnosis not present

## 2021-06-16 LAB — PACEMAKER DEVICE OBSERVATION

## 2021-06-16 NOTE — Progress Notes (Signed)
Electrophysiology Office Follow up Visit Note:    Date:  06/16/2021   ID:  Jordan Oconnell, DOB 29-Sep-1940, MRN [id number]  PCP:  Burnard Bunting, MD  [facility] Cardiologist:  Sanda Klein, MD  [facility] Electrophysiologist:  Vickie Epley, MD    Interval History:    Jordan Oconnell is a 81 y.o. male who presents for a follow up visit. They were last seen in clinic 02/03/2021.  Since their last appointment, they underwent a [employer] dual chamber permanent pacemaker implant on 02/12/2021 indicated by symptomatic tachycardia-bradycardia syndrome.  They followed up with Jordan Ellison, PA-C on 03/25/2021 where he was doing well, able to walk 3 miles most days without issues. He was not aware of any Afib episodes. Device interrogation at that visit showed Afib burden 18% with undersensing noted ( >40% by histograms). Also he was pending a nephrostomy at [facility]. Amiodarone was offered but declined as he was asymptomatic. Not considered a candidate for flecainide with structural heart disease, and not a tikosyn candidate with CKD. They discussed CVA risk and CHA2DS2VASC is of at least 4.  Overall, he reports his incision has healed and he denies any discomfort due to his device.  Lately he has been walking frequently. Usually he is able to walk 18-21 miles a week.  Currently he is taking 81 mg ASA.  He notes that his cancer returned in his right kidney. He was started on Keytruda infusions every 6 weeks for 9 sessions at [facility], [place].   He denies any palpitations, chest pain, shortness of breath, or peripheral edema. No lightheadedness, headaches, syncope, orthopnea, or PND.      Past Medical History:  Diagnosis Date   Bladder cancer ([place])    BPH (benign prostatic hyperplasia)    CKD (chronic kidney disease), stage IV (HCC)    Colon polyps    Diverticulosis    GERD (gastroesophageal reflux disease)    Hyperlipidemia    pt denies   Hypertension    PAF (paroxysmal  atrial fibrillation) ([place])    on Flecanide and diltiazem. No OAC given recurrent hematuria   Renal cell carcinoma ([place]) 2008   left   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 Burt Knack and Dr. Cyndia Bent   Severe aortic stenosis 10/05/2020    Past Surgical History:  Procedure Laterality Date   APPENDECTOMY     BLADDER SURGERY  2008   transurethral resection/resection of prostatic urethra   BOWEL RESECTION     ESOPHAGOGASTRODUODENOSCOPY N/A 07/24/2013   Procedure: ESOPHAGOGASTRODUODENOSCOPY (EGD);  Surgeon: [name], MD;  Location: [name];  Service: Endoscopy;  Laterality: N/A;   INTRAOPERATIVE TRANSTHORACIC ECHOCARDIOGRAM N/A 01/27/2021   Procedure: INTRAOPERATIVE TRANSTHORACIC ECHOCARDIOGRAM;  Surgeon: Sherren Mocha, MD;  Location: [place] [facility];  Service: Open Heart Surgery;  Laterality: N/A;   laparoscopic surgery  2012   laser   (?)   NEPHRECTOMY  2007   partial, left   NEPHROSTOMY  2011   stent   NM MYOCAR PERF WALL MOTION  09/08/2010   Normal   PACEMAKER IMPLANT N/A 02/12/2021   Procedure: PACEMAKER IMPLANT;  Surgeon: Vickie Epley, MD;  Location: [place] [facility];  Service: Cardiovascular;  Laterality: N/A;   RIGHT HEART CATH AND CORONARY ANGIOGRAPHY N/A 10/06/2020   Procedure: RIGHT HEART CATH AND CORONARY ANGIOGRAPHY;  Surgeon: Sherren Mocha, MD;  Location: [place] [facility];  Service:  Cardiovascular;  Laterality: N/A;   ROBOT ASSISTED LAPAROSCOPIC COMPLETE CYSTECT ILEAL CONDUIT     TRANSCATHETER AORTIC VALVE REPLACEMENT, TRANSFEMORAL Right 01/27/2021   Procedure: TRANSCATHETER AORTIC VALVE REPLACEMENT, TRANSFEMORAL;  Surgeon: Sherren Mocha, MD;  Location: [place] [facility];  Service: Open Heart Surgery;  Laterality: Right;   US ECHOCARDIOGRAPHY  3/71/6967   mild diastolic dysfunction,mild dilated LA,mild MR,AI,mildly dilated aortic root    Current Medications: Current  Meds  Medication Sig   ALPRAZolam (XANAX) 0.25 MG tablet Take 0.25 mg by mouth at bedtime.   aspirin 81 MG chewable tablet Chew 1 tablet (81 mg total) by mouth daily.   azithromycin (ZITHROMAX) 500 MG tablet Take 1 tablet by mouth 1 hour before dental procedures and cleanings   diltiazem (CARDIZEM CD) 180 MG 24 hr capsule Take 1 capsule (180 mg total) by mouth 2 (two) times daily.   escitalopram (LEXAPRO) 10 MG tablet Take 10 mg by mouth every morning.   hydrALAZINE (APRESOLINE) 50 MG tablet Take 50 mg by mouth 2 (two) times daily.   latanoprost (XALATAN) 0.005 % ophthalmic solution Place 1 drop into both eyes at bedtime.   metoprolol succinate (TOPROL-XL) 50 MG 24 hr tablet Take 0.5 tablets (25 mg total) by mouth in the morning and at bedtime. Take with or immediately following a meal.   mirtazapine (REMERON) 15 MG tablet Take 7.5 mg by mouth at bedtime.   omeprazole (PRILOSEC) 20 MG capsule Take 20 mg by mouth daily.   sodium bicarbonate 650 MG tablet Take 1,300 mg by mouth 2 (two) times daily.     Allergies:   Amoxicillin, Doxycycline, and Flomax [tamsulosin hcl]   Social History   Socioeconomic History   Marital status: Divorced    Spouse name: Not on file   Number of children: 2   Years of education: Not on file   Highest education level: Not on file  Occupational History   Occupation: Pharmacist, hospital  Tobacco Use   Smoking status: Never   Smokeless tobacco: Never  Vaping Use   Vaping Use: Never used  Substance and Sexual Activity   Alcohol use: Yes    Comment: occasionally   Drug use: No   Sexual activity: Not on file  Other Topics Concern   Not on file  Social History Narrative   Not on file   Social Determinants of Health   Financial Resource Strain: Not on file  Food Insecurity: Not on file  Transportation Needs: Not on file  Physical Activity: Not on file  Stress: Not on file  Social Connections: Not on file     Family History: The patient's family history  includes Breast cancer in his daughter; Colon cancer in his brother, father, and mother; Crohn's disease in his son; Ovarian cancer in his mother.  ROS:   Please see the history of present illness.    All other systems reviewed and are negative.  EKGs/Labs/Other Studies Reviewed:    The following studies were reviewed today:  06/16/2021  In clinic device interrogation personally reviewed: Battery longevity 10.5 years Lead parameter stable DDD 60-1 10 32% burden of atrial fibrillation 10 % atrial paced 64% ventricular paced   01/2021  Monitor The dominant rhythm was normal sinus rhythm with normal circadian variation. There is first-degree AV block. There is a high burden of paroxysmal atrial fibrillation, representing 34% of the recorded rhythm. There are rare episodes of brief nonsustained ventricular tachycardia. There are also episodes of paroxysmal atrial tachycardia with 1: 1 AV  conduction. Grade second-degree intraventricular block (Mobitz type II) is seen at times, with pauses lasting for up to 7.7 seconds.   Abnormal event monitor due to high burden of paroxysmal atrial fibrillation as well as lengthy episodes of high-grade second-degree AV block with pauses up to 7.7 seconds. The patient has already undergone pacemaker implantation after this tracing was obtained.    Patch Wear Time:  13 days and 23 hours (2023-01-05T14:08:59-0500 to 2023-01-19T15:26:18-0500)   Monitor 1 Patient had a min HR of 21 bpm, max HR of 186 bpm, and avg HR of 83 bpm. Predominant underlying rhythm was Sinus Rhythm. First Degree AV Block was present. 2 VT runs occurred, the run with the fastest interval lasting 6 beats with a max rate of 164 bpm  (avg 132 bpm); the run with the fastest interval was also the longest. 40 SVT runs occurred, the run with the fastest interval lasting 5 beats with a max rate of 182 bpm, the longest lasting 11.5 secs with an avg rate of 118 bpm. Atrial Fibrillation  occurred  (34% burden), ranging from 46-186 bpm (avg of 107 bpm), the longest lasting 21 hours 5 mins with an avg rate of 93 bpm. 7 Pauses occurred, the longest lasting 6 secs (10 bpm). Pause(s) occurred due to Possible High Grade AVB. 1 episode(s) of AVB  (High Grade) occurred, lasting a total of 3 secs. Second Degree AVB -Mobitz I (Wenckebach) was present. Atrial Fibrillation was detected within +/- 45 seconds of symptomatic patient event(s). Isolated SVEs were occasional (1.1%, 15483), SVE Couplets  were rare (<1.0%, 145), and SVE Triplets were rare (<1.0%, 15). Isolated VEs were rare (<1.0%), VE Couplets were rare (<1.0%), and no VE Triplets were present. Ventricular Bigeminy and Trigeminy were present. MDN criteria for First Documentation of AF,  High Grade AVB and Pauses met - previously notified Dr. Acie Fredrickson on 02 Feb 2021 at 8:28 PM EST (FP).   Monitor 2 Patient had a min HR of 37 bpm, max HR of 194 bpm, and avg HR of 117 bpm. Predominant underlying rhythm was Atrial Fibrillation. First Degree AV Block was present. QRS morphology changes were present due to possible Rate Related Bundle Branch Block. 3  Supraventricular Tachycardia runs occurred, the run with the fastest interval lasting 11.2 secs with a max rate of 158 bpm, the longest lasting 11.5 secs with an avg rate of 127 bpm. Atrial Fibrillation occurred (73% burden), ranging from 73-194 bpm (avg  of 133 bpm), the longest lasting 21 hours 16 mins with an avg rate of 137 bpm. Atrial Fibrillation was present at de-activation of device. 2 Pauses occurred, the longest lasting 7.7 secs (8 bpm). True duration of Pause(s) difficult to ascertain due to  the presence of artifact. 1 episode(s) of AV Block (2nd Mobitz II) occurred, lasting a total of 2 secs. Atrial Fibrillation was detected within +/- 45 seconds of symptomatic patient event(s). Isolated SVEs were rare (<1.0%), SVE Couplets were rare  (<1.0%), and SVE Triplets were rare (<1.0%). Isolated VEs were  rare (<1.0%), and no VE Couplets or VE Triplets were present. Ventricular Trigeminy was present. MD notification criteria for Pauses met- previously notified Dr. Vedia Pereyra on 11 Feb 2021 at  10:25pm EST (CL).  02/20/2021 Echo  1. Abnormal septal motion . Left ventricular ejection fraction, by  estimation, is 55%. The left ventricle has normal function. The left  ventricle has no regional wall motion abnormalities. Left ventricular  diastolic parameters were normal.   2. Pacing wires in RA/RV.  Right ventricular systolic function is normal.  The right ventricular size is normal.   3. Left atrial size was moderately dilated.   4. The mitral valve is abnormal. Mild mitral valve regurgitation. No  evidence of mitral stenosis.   5. Post TAVR with 29 mm Sapien 3 valve no significant PVL gradients have  increased since previous echo . The aortic valve has been  repaired/replaced. Aortic valve regurgitation is not visualized. No aortic  stenosis is present.   6. Aortic dilatation noted. There is moderate dilatation of the ascending  aorta, measuring 43 mm.   7. The inferior vena cava is normal in size with greater than 50%  respiratory variability, suggesting right atrial pressure of 3 mmHg.   02/12/2021  Pacemaker Implant CONCLUSIONS:   1. Symptomatic tachycardia-bradycardia syndrome  2. Dual chamber permanent pacemaker implant with left bundle area led  3.  No early apparent complications.    EKG:  EKG is personally reviewed.  06/16/2021: Atrial fibrillation with a left bundle like QRS 02/03/2021:  sinus rhythm, first-degree AV delay with a PR interval of 356 ms.  Left bundle branch block with a QRS duration of 154 ms.  Recent Labs: 01/23/2021: ALT 13; B Natriuretic Peptide 720.2 01/28/2021: Magnesium 1.8 02/03/2021: BUN 61; Creatinine, Ser 2.57; Hemoglobin 13.6; Platelets 153; Potassium 5.0; Sodium 138   Recent Lipid Panel No results found for: CHOL, TRIG, HDL, CHOLHDL, VLDL, LDLCALC,  LDLDIRECT  Physical Exam:    VS:  BP 110/72   Pulse 86   Ht _0  (1.854 m)   BMI 26.91 kg/m     Wt Readings from Last 3 Encounters:  03/25/21 204 lb (92.5 kg)  02/20/21 197 lb 9.6 oz (89.6 kg)  02/12/21 199 lb (90.3 kg)     GEN: Well nourished, well developed in no acute distress HEENT: Normal NECK: No JVD; No carotid bruits LYMPHATICS: No lymphadenopathy CARDIAC: Irregularly irregular, no murmurs, rubs, gallops.  Pocket well-healed RESPIRATORY:  Clear to auscultation without rales, wheezing or rhonchi  ABDOMEN: Soft, non-tender, non-distended MUSCULOSKELETAL:  No edema; No deformity  SKIN: Warm and dry NEUROLOGIC:  Alert and oriented x 3 PSYCHIATRIC:  Normal affect        ASSESSMENT:    1. S/P TAVR (transcatheter aortic valve replacement)   2. Sick sinus syndrome ([place])   3. Pacemaker   4. PAF (paroxysmal atrial fibrillation) ([place])   5. Urothelial cancer ([place])    PLAN:    In order of problems listed above:  #Severe aortic stenosis #Status post TAVR #Permanent pacemaker in situ Device functioning appropriately.  Continue remote monitoring.  #Paroxysmal atrial fibrillation Burden of atrial fibrillation on the device check is 32%.  He is not currently on anticoagulant.  This has been discussed at previous visits with the patient.  Previously, he declined anticoagulation.  I did discuss this again with the patient during today's visit.  It is certainly a balance between the risks of bleeding especially in light of his active urothelial cancer and the risks of stroke given his elevated CHA2DS2-VASc.   I did offer to start anticoagulation during today's visit with the patient declined.  He would like to have this discussed with his primary care physician before finalizing a treatment plan.  Follow-up in 1 year.  Total time spent with patient today 34 minutes. This includes reviewing records, evaluating the patient and coordinating care.   I have drafted a letter  to send to Drs. Brent Bulla and Reynaldo Minium to get their input  regarding the risks/benefits of anticoagulation.  Medication Adjustments/Labs and Tests Ordered: Current medicines are reviewed at length with the patient today.  Concerns regarding medicines are outlined above.  No orders of the defined types were placed in this encounter.  No orders of the defined types were placed in this encounter.   I,Mathew Stumpf,acting as a Education administrator for Vickie Epley, MD.,have documented all relevant documentation on the behalf of Vickie Epley, MD,as directed by  Vickie Epley, MD while in the presence of Vickie Epley, MD.  I, Vickie Epley, MD, have reviewed all documentation for this visit. The documentation on 06/16/21 for the exam, diagnosis, procedures, and orders are all accurate and complete.   Signed, Lars Mage, MD, [facility], [facility] 06/16/2021 8:58 PM    Electrophysiology [facility]

## 2021-06-16 NOTE — Patient Instructions (Signed)
Medication Instructions:  ?Your physician recommends that you continue on your current medications as directed. Please refer to the Current Medication list given to you today. ?*If you need a refill on your cardiac medications before your next appointment, please call your pharmacy* ? ?Lab Work: ?None. ?If you have labs (blood work) drawn today and your tests are completely normal, you will receive your results only by: ?MyChart Message (if you have MyChart) OR ?A paper copy in the mail ?If you have any lab test that is abnormal or we need to change your treatment, we will call you to review the results. ? ?Testing/Procedures: ?None. ? ?Follow-Up: ?At [facility], you and your health needs are our priority.  As part of our continuing mission to provide you with exceptional heart care, we have created designated Provider Care Teams.  These Care Teams include your primary Cardiologist (physician) and Advanced Practice Providers (APPs -  Physician Assistants and Nurse Practitioners) who all work together to provide you with the care you need, when you need it. ? ?Your physician wants you to follow-up in: 12 months with Cameron Lambert, MD or one of the following Advanced Practice Providers on your designated Care Team:   ? ?Renee Ursuy, PA-C ?Michael "Andy" Tillery, PA-C ?  You will receive a reminder letter in the mail two months in advance. If you don't receive a letter, please call our office to schedule the follow-up appointment. ? ?We recommend signing up for the patient portal called "MyChart".  Sign up information is provided on this After Visit Summary.  MyChart is used to connect with patients for Virtual Visits (Telemedicine).  Patients are able to view lab/test results, encounter notes, upcoming appointments, etc.  Non-urgent messages can be sent to your provider as well.   ?To learn more about what you can do with MyChart, go to [email or web address].   ? ?Any Other Special Instructions Will Be Listed  Below (If Applicable). ? ? ? ? ?  ? ? ?

## 2021-07-10 DIAGNOSIS — C678 Malignant neoplasm of overlapping sites of bladder: Secondary | ICD-10-CM | POA: Diagnosis not present

## 2021-07-10 DIAGNOSIS — E872 Acidosis, unspecified: Secondary | ICD-10-CM | POA: Diagnosis not present

## 2021-07-10 DIAGNOSIS — R918 Other nonspecific abnormal finding of lung field: Secondary | ICD-10-CM | POA: Diagnosis not present

## 2021-07-15 DIAGNOSIS — C661 Malignant neoplasm of right ureter: Secondary | ICD-10-CM | POA: Diagnosis not present

## 2021-07-15 DIAGNOSIS — N184 Chronic kidney disease, stage 4 (severe): Secondary | ICD-10-CM | POA: Diagnosis not present

## 2021-07-15 DIAGNOSIS — C678 Malignant neoplasm of overlapping sites of bladder: Secondary | ICD-10-CM | POA: Diagnosis not present

## 2021-07-21 DIAGNOSIS — Z9622 Myringotomy tube(s) status: Secondary | ICD-10-CM | POA: Diagnosis not present

## 2021-07-21 DIAGNOSIS — H6522 Chronic serous otitis media, left ear: Secondary | ICD-10-CM | POA: Diagnosis not present

## 2021-07-21 DIAGNOSIS — H6982 Other specified disorders of Eustachian tube, left ear: Secondary | ICD-10-CM | POA: Diagnosis not present

## 2021-07-22 ENCOUNTER — Ambulatory Visit: Payer: Medicare PPO | Admitting: Cardiovascular Disease

## 2021-07-22 ENCOUNTER — Encounter: Payer: Self-pay | Admitting: Cardiovascular Disease

## 2021-07-22 VITALS — BP 125/79 | HR 64 | Ht 73.0 in | Wt 206.4 lb

## 2021-07-22 DIAGNOSIS — C689 Malignant neoplasm of urinary organ, unspecified: Secondary | ICD-10-CM | POA: Diagnosis not present

## 2021-07-22 DIAGNOSIS — I10 Essential (primary) hypertension: Secondary | ICD-10-CM

## 2021-07-22 DIAGNOSIS — N184 Chronic kidney disease, stage 4 (severe): Secondary | ICD-10-CM

## 2021-07-22 DIAGNOSIS — Z952 Presence of prosthetic heart valve: Secondary | ICD-10-CM | POA: Diagnosis not present

## 2021-07-22 DIAGNOSIS — I480 Paroxysmal atrial fibrillation: Secondary | ICD-10-CM | POA: Diagnosis not present

## 2021-07-22 NOTE — Patient Instructions (Signed)
Medication Instructions:  No changes *If you need a refill on your cardiac medications before your next appointment, please call your pharmacy*   Lab Work: None ordered If you have labs (blood work) drawn today and your tests are completely normal, you will receive your results only by: MyChart Message (if you have MyChart) OR A paper copy in the mail If you have any lab test that is abnormal or we need to change your treatment, we will call you to review the results.   Testing/Procedures: None ordered   Follow-Up: At [facility], you and your health needs are our priority.  As part of our continuing mission to provide you with exceptional heart care, we have created designated Provider Care Teams.  These Care Teams include your primary Cardiologist (physician) and Advanced Practice Providers (APPs -  Physician Assistants and Nurse Practitioners) who all work together to provide you with the care you need, when you need it.  We recommend signing up for the patient portal called "MyChart".  Sign up information is provided on this After Visit Summary.  MyChart is used to connect with patients for Virtual Visits (Telemedicine).  Patients are able to view lab/test results, encounter notes, upcoming appointments, etc.  Non-urgent messages can be sent to your provider as well.   To learn more about what you can do with MyChart, go to [email or web address].    Your next appointment:   6 month(s)  The format for your next appointment:   In Person  Provider:   Mihai Croitoru, MD {    Important Information About Sugar       

## 2021-07-22 NOTE — Progress Notes (Signed)
Cardiology Office Note    Date:  07/26/2021   ID:  Jordan, Oconnell Nov 24, 1940, MRN [id number]  PCP:  Burnard Bunting, MD  Cardiologist:   Sanda Klein, MD   Chief Complaint  Patient presents with   Atrial Fibrillation    History of Present Illness:  Jordan Oconnell is a 81 y.o. male with AS s/p TAVR Jordan Oconnell S3U 29 mm, 01/25/2021), here bradycardia syndrome s/p dual chamber pacemaker (BSC Accolade, 02/12/2021, Jordan Oconnell), paroxysmal atrial fibrillation, moderate aortic root aneurysm (46 mm by CT October 2022) and multiple sites of urothelial cancer involving his bladder, ureters and renal pelvises bilaterally as well as renal cell carcinoma (history of partial left nephrectomy and complete cystectomy with urostomy), chronic kidney disease stage 4 with a baseline creatinine around 2.7-3.0.  Following pacemaker implantation and its apparent that he actually has a very high burden of atrial fibrillation at around 30%.  Because of his issues with hematuria and invasive urological procedures, he was reluctant to start anticoagulation.  He feels well as long as he stays out of the heat, which reduces his exercise tolerance.  He walks early in the day.  The patient specifically denies any chest pain at rest exertion, dyspnea at rest or with exertion, orthopnea, paroxysmal nocturnal dyspnea, syncope, palpitations, focal neurological deficits, intermittent claudication, lower extremity edema, unexplained weight gain, cough, hemoptysis or wheezing.   He has not had any recent hematuria and there is no plan for any invasive urological procedures at least until his next follow-up urology appointment on September 22 (new Urologist, Dr. Manuella Oconnell). He has extensive (>50%) urothelial carcinoma in the solitary right kidney, but appears to have no progression of disease while on pembrolizumab and no metastatic disease.   Followed at [facility] by his urologist, Dr. Lorine Oconnell - now Dr. Manuella Oconnell, most recent  evaluation in June 2022 with no evidence of cancer recurrence by imaging studies since his left nephrectomy in June 2018.  He has a right lower quadrant ileal conduit and urostomy.  His creatinine has been stable at 2.8-3.0 over the last couple of years, slightly better at 2.5 at his last visit with Dr. Dimas Oconnell in June 2022.  He has chronic hyperchloremic metabolic acidosis treated with sodium bicarbonate tablets.  His nephrologist is Dr. [name], but as far as I can tell his last visit with him was in June 2019.  His oncologist is Dr. Tasia Oconnell.  He had a normal treadmill stress test in 2018.  September 2022 coronary angiography showed the only meaningful lesion was a 90% stenosis in the first diagonal artery, with 30-60% stenoses in the first diagonal artery, ramus intermedius, mid LAD and mid LCx.    Chest CT in October 2022 showed evidence of a moderate aortic root aneurysm (46 mm maximum diameter).  His most recent echo after TAVR in January 2023 showed LVEF 55%, normally functioning TAVR with no evidence of paravalvular leak, mean gradient 15 mmHg and dimensionless index 0.31.  Pacemaker interrogation performed in the office today shows normal device function, anticipated generator longevity 11.5 years, 7% atrial pacing and 70% ventricular pacing, 36% atrial fibrillation with ventricular rates in the 82-130 bpm range, with some improvement in rate control when compared with the previous download on May 23.   Past Medical History:  Diagnosis Date   Bladder cancer ([place])    BPH (benign prostatic hyperplasia)    CKD (chronic kidney disease), stage IV (HCC)    Colon polyps    Diverticulosis  GERD (gastroesophageal reflux disease)    Hyperlipidemia    pt denies   Hypertension    PAF (paroxysmal atrial fibrillation) (HCC)    on Flecanide and diltiazem. No OAC given recurrent hematuria   Renal cell carcinoma ([place]) 2008   left   S/P TAVR (transcatheter aortic valve replacement) 01/27/2021    s/p TAVR with a 29 mm Edwards S3UR via the TF approach by Dr. Burt Knack and Dr. Cyndia Bent   Severe aortic stenosis 10/05/2020    Past Surgical History:  Procedure Laterality Date   APPENDECTOMY     BLADDER SURGERY  2008   transurethral resection/resection of prostatic urethra   BOWEL RESECTION     ESOPHAGOGASTRODUODENOSCOPY N/A 07/24/2013   Procedure: ESOPHAGOGASTRODUODENOSCOPY (EGD);  Surgeon: [name], MD;  Location: [name];  Service: Endoscopy;  Laterality: N/A;   INTRAOPERATIVE TRANSTHORACIC ECHOCARDIOGRAM N/A 01/27/2021   Procedure: INTRAOPERATIVE TRANSTHORACIC ECHOCARDIOGRAM;  Surgeon: Sherren Mocha, MD;  Location: [place] [facility];  Service: Open Heart Surgery;  Laterality: N/A;   laparoscopic surgery  2012   laser   (?)   NEPHRECTOMY  2007   partial, left   NEPHROSTOMY  2011   stent   NM MYOCAR PERF WALL MOTION  09/08/2010   Normal   PACEMAKER IMPLANT N/A 02/12/2021   Procedure: PACEMAKER IMPLANT;  Surgeon: Vickie Epley, MD;  Location: [place] [facility];  Service: Cardiovascular;  Laterality: N/A;   RIGHT HEART CATH AND CORONARY ANGIOGRAPHY N/A 10/06/2020   Procedure: RIGHT HEART CATH AND CORONARY ANGIOGRAPHY;  Surgeon: Sherren Mocha, MD;  Location: [place] [facility];  Service: Cardiovascular;  Laterality: N/A;   ROBOT ASSISTED LAPAROSCOPIC COMPLETE CYSTECT ILEAL CONDUIT     TRANSCATHETER AORTIC VALVE REPLACEMENT, TRANSFEMORAL Right 01/27/2021   Procedure: TRANSCATHETER AORTIC VALVE REPLACEMENT, TRANSFEMORAL;  Surgeon: Sherren Mocha, MD;  Location: [place] [facility];  Service: Open Heart Surgery;  Laterality: Right;   US ECHOCARDIOGRAPHY  07/26/6376   mild diastolic dysfunction,mild dilated LA,mild MR,AI,mildly dilated aortic root    Current Medications: Outpatient Medications Prior to Visit  Medication Sig Dispense Refill   ALPRAZolam (XANAX) 0.25 MG tablet Take 0.25 mg by mouth at bedtime.     aspirin 81 MG chewable tablet Chew 1 tablet (81 mg total) by  mouth daily.     diltiazem (CARDIZEM CD) 180 MG 24 hr capsule Take 1 capsule (180 mg total) by mouth 2 (two) times daily. 60 capsule 5   escitalopram (LEXAPRO) 10 MG tablet Take 10 mg by mouth every morning.     hydrALAZINE (APRESOLINE) 50 MG tablet Take 50 mg by mouth 2 (two) times daily.     latanoprost (XALATAN) 0.005 % ophthalmic solution Place 1 drop into both eyes at bedtime.     metoprolol succinate (TOPROL-XL) 50 MG 24 hr tablet Take 0.5 tablets (25 mg total) by mouth in the morning and at bedtime. Take with or immediately following a meal. 90 tablet 3   mirtazapine (REMERON) 15 MG tablet Take 7.5 mg by mouth at bedtime.     omeprazole (PRILOSEC) 20 MG capsule Take 20 mg by mouth daily.     Pembrolizumab (KEYTRUDA IV) Inject into the vein every 6 (six) weeks. Infusion     sodium bicarbonate 650 MG tablet Take 1,300 mg by mouth 2 (two) times daily.     azithromycin (ZITHROMAX) 500 MG tablet Take 1 tablet by mouth 1 hour before dental procedures and cleanings (Patient not taking: Reported on 07/22/2021) 12 tablet 6  No facility-administered medications prior to visit.     Allergies:   Amoxicillin, Doxycycline, and Flomax [tamsulosin hcl]   Social History   Socioeconomic History   Marital status: Divorced    Spouse name: Not on file   Number of children: 2   Years of education: Not on file   Highest education level: Not on file  Occupational History   Occupation: Pharmacist, hospital  Tobacco Use   Smoking status: Never   Smokeless tobacco: Never  Vaping Use   Vaping Use: Never used  Substance and Sexual Activity   Alcohol use: Yes    Comment: occasionally   Drug use: No   Sexual activity: Not on file  Other Topics Concern   Not on file  Social History Narrative   Not on file   Social Determinants of Health   Financial Resource Strain: Not on file  Food Insecurity: Not on file  Transportation Needs: Not on file  Physical Activity: Not on file  Stress: Not on file  Social  Connections: Not on file     Family History:  The patient's family history includes Breast cancer in his daughter; Colon cancer in his brother, father, and mother; Crohn's disease in his son; Ovarian cancer in his mother.   ROS:   Please see the history of present illness.     All other systems are reviewed and are negative.  PHYSICAL EXAM:   VS:  BP 125/79 (BP Location: Left Arm, Patient Position: Sitting, Cuff Size: Large)   Pulse 64   Ht '6\' 1"'$  (1.854 m)   Wt 206 lb 6.4 oz (93.6 kg)   SpO2 96%   BMI 27.23 kg/m      General: Alert, oriented x3, no distress.  Healthy left subclavian pacemaker site Head: no evidence of trauma, PERRL, EOMI, no exophtalmos or lid lag, no myxedema, no xanthelasma; normal ears, nose and oropharynx Neck: normal jugular venous pulsations and no hepatojugular reflux; brisk carotid pulses without delay and no carotid bruits Chest: clear to auscultation, no signs of consolidation by percussion or palpation, normal fremitus, symmetrical and full respiratory excursions Cardiovascular: normal position and quality of the apical impulse, regular rhythm, normal first and second heart sounds, barely audible systolic ejection murmur at the aortic focus, no diastolic murmurs, rubs or gallops Abdomen: no tenderness, large ventral hernia without evidence of strangulation, no masses by palpation, no abnormal pulsatility or arterial bruits, normal bowel sounds, no hepatosplenomegaly Extremities: no clubbing, cyanosis or edema; 2+ radial, ulnar and brachial pulses bilaterally; 2+ right femoral, posterior tibial and dorsalis pedis pulses; 2+ left femoral, posterior tibial and dorsalis pedis pulses; no subclavian or femoral bruits Neurological: grossly nonfocal Psych: Normal mood and affect     Wt Readings from Last 3 Encounters:  07/22/21 206 lb 6.4 oz (93.6 kg)  03/25/21 204 lb (92.5 kg)  02/20/21 197 lb 9.6 oz (89.6 kg)      Studies/Labs Reviewed:   EKG:  EKG is  not ordered today.  On his pacemaker, the presenting electrogram shows atrial sensed, ventricular paced rhythm.  Personally reviewed the tracing from 06/16/2021, shows sinus rhythm with first-degree AV block (PR 232 ms), LVH, left anterior fascicular block with mild QRS broadening at 100 ms, T wave inversion in 1 and aVL, QTC 471 ms  Cardiac catheterization September 2022   Ramus lesion is 60% stenosed.   1st Diag lesion is 90% stenosed.   2nd Diag lesion is 50% stenosed.   Prox LAD to Mid LAD lesion  is 30% stenosed.   Mid Cx lesion is 30% stenosed.   1.  Nonobstructive coronary artery disease with separate LAD and left circumflex ostia, patent LAD with mild nonobstructive plaquing, severe ostial diagonal stenoses, patent left circumflex with moderate intermediate branch stenosis and no other significant stenoses, patent nondominant RCA 2.  Known severe aortic stenosis with heavy calcification and restriction of the aortic valve leaflets seen on plain fluoroscopy 3.  Normal right heart hemodynamics with preserved cardiac output, normal pulmonary pressures, and normal LVEDP  Echocardiogram 02/16/2021   1. Abnormal septal motion . Left ventricular ejection fraction, by  estimation, is 55%. The left ventricle has normal function. The left  ventricle has no regional wall motion abnormalities. Left ventricular diastolic parameters were normal.   2. Pacing wires in RA/RV. Right ventricular systolic function is normal. The right ventricular size is normal.   3. Left atrial size was moderately dilated.   4. The mitral valve is abnormal. Mild mitral valve regurgitation. No evidence of mitral stenosis.   5. Post TAVR with 29 mm Sapien 3 valve no significant PVL gradients have  increased since previous echo . The aortic valve has been  repaired/replaced. Aortic valve regurgitation is not visualized. No aortic stenosis is present.   6. Aortic dilatation noted. There is moderate dilatation of the  ascending aorta, measuring 43 mm.   7. The inferior vena cava is normal in size with greater than 50%  respiratory variability, suggesting right atrial pressure of 3 mmHg.   CT chest/aorta October 2022 1. Tricuspid aortic valve with acquired fusion of the RCC/LCC. Bulky calcification noted at the commissure between the RCC/LCC. 2. Severe aortic stenosis (calcium score 6064). 3. Annular measurements appropriate for 29 mm S3 TAVR (615 mm2). 4. Trivial annular calcification. 5. Sufficient coronary to annulus distance. 6. Optimal Fluoroscopic Angle for Delivery: LAO 17 CAU 14 7. 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   1. Vascular findings and measurements pertinent to potential TAVR procedure, as detailed above. 2. Severe thickening and calcification of the aortic valve, compatible with reported clinical history of severe aortic stenosis. 3. 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 Urologic consultation is recommended for further clinical evaluation. 4. Aortic atherosclerosis, in addition to two vessel coronary artery disease. 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 5. Mild cardiomegaly. 6. Cholelithiasis without evidence of acute cholecystitis at this time. 7. Status post left radical nephrectomy. 8. Large ventral hernia containing multiple loops of small bowel and a portion of the cecum. 9. Additional incidental findings, as above.  [facility] 07[facility]10/2021  1. Persistent nodular soft tissue thickening poorly evaluated on this  noncontrast exam within the left extrarenal pelvis which is concerning for  malignancy. There is slightly increased stranding about the extrarenal  pelvis which can be seen in the setting of  infection. Correlate with  urinalysis.  2. No evidence of distant metastatic disease.    Recent Labs: January 27, 2018 creatinine 2.9, Hemoglobin 14.2 Total cholesterol 153, HDL 38, LDL 94, triglycerides 103  02/12/2020 Cholesterol 157, HDL 38, LDL 102, triglycerides 84 Hemoglobin A1c 5.5%, potassium 4.7, normal liver function tests, TSH 0.840  07/09/2020 ([facility]) Sodium 139, testing 4.4, chloride 113, CO2 19, creatinine 2.5, BUN 54, normal liver function tests,  hemoglobin 15.1     Latest Ref Rng & Units 02/03/2021    2:46 PM 01/29/2021    4:29 AM 01/28/2021    4:32 AM  BMP  Glucose 70 - 99 mg/dL 100  102  94   BUN 8 - 27 mg/dL 61  40  43   Creatinine 0.76 - 1.27 mg/dL 2.57  2.27  2.35   BUN/Creat Ratio 10 - 24 24     Sodium 134 - 144 mmol/L 138  136  136   Potassium 3.5 - 5.2 mmol/L 5.0  4.2  4.3   Chloride 96 - 106 mmol/L 106  110  111   CO2 20 - 29 mmol/L '16  18  17   '$ Calcium 8.6 - 10.2 mg/dL 9.7  8.5  8.6    01 10 2023 Hemoglobin 13.6  Lipid Panel  No results found for: "CHOL", "TRIG", "HDL", "CHOLHDL", "VLDL", "LDLCALC", "LDLDIRECT", "LABVLDL"    01 16 2023 Cholesterol 144, HDL 35, LDL 93, triglycerides 78 Hemoglobin A1c 5.2% Potassium 5.1, ALT 20, TSH 1.73   Additional studies/ records that were reviewed today include:  Notes from follow-up visit with Dr. Dimas Oconnell, Rob Hickman from June 2022   ASSESSMENT:    1. S/P TAVR (transcatheter aortic valve replacement)   2. PAF (paroxysmal atrial fibrillation) ([place])   3. Essential hypertension   4. CKD (chronic kidney disease) stage 4, GFR 15-29 ml/min (HCC)   5. Urothelial cancer ([place])       PLAN:  In order of problems listed above:  AS s/p TAVR:  good result. Reinforced the need for endocarditis prophylaxis. AFib: Despite increased embolic risk, CHADSVasc 3 (age, HTN), we have not prescribed anticoagulants due to his problems with recurrent hematuria.  We discussed the risk of embolic events and stroke.  I do not think  he would be a great candidate for long-term anticoagulation, but if we can get him through to 6 weeks of anticoagulation safely, he could be a candidate for Watchman device.  Options for anticoagulation include warfarin adjusted for INR and low-dose Eliquis 2.5 mg twice daily.  Will discuss with Dr. Quentin Oconnell.  No longer on flecainide following cath which showed evidence of CAD, TAVR and pacemaker implantation.  Not a candidate for Tikosyn due to CKD.  Amiodarone was offered but declined due to side effect risks.  Burden of atrial fibrillation is high at 30-35% but it is asymptomatic.  Discussed with Dr. Quentin Oconnell.  He would be agreeable to Eliquis 2.5 mg twice daily.  We may have a good window to trial this medication now, since he does not have any urological procedures scheduled at least until late September. HTN: In normal range. CKD4: stable following iodinated contrast based cath and CTA last fall and TAVR this January. He does have chronic metabolic acidosis and takes sodium bicarbonate supplements. Widespread urothelial cancer , his new Urologist at [facility] is Dr. Manuella Oconnell.  Good report at his follow-up MRI in June 2022 , but CT preTAVR raised concern for recurrent disease in right renal pelvis, no progression seen on CT without contrast in June 2023.  Note 04/13/2021 Right antegrade nephroscopy: - Diffuse (>50%) disease of the right renal pelvis: referred to [facility] med onc to consider pembrolizumab - Not resected or ablated. Papillary urothelial carcinoma, high grade, non-invasive.  Muscularis propria is not represented.    Medication Adjustments/Labs and Tests Ordered: Current medicines are reviewed at length with the patient today.  Concerns regarding medicines are outlined above.  Medication changes,  Labs and Tests ordered today are listed in the Patient Instructions below. Patient Instructions  Medication Instructions:  No changes *If you need a refill on your cardiac medications before your next  appointment, please call your pharmacy*   Lab Work: None ordered If you have labs (blood work) drawn today and your tests are completely normal, you will receive your results only by: [place] (if you have MyChart) OR A paper copy in the mail If you have any lab test that is abnormal or we need to change your treatment, we will call you to review the results.   Testing/Procedures: None ordered   Follow-Up: At [facility], you and your health needs are our priority.  As part of our continuing mission to provide you with exceptional heart care, we have created designated Provider Care Teams.  These Care Teams include your primary Cardiologist (physician) and Advanced Practice Providers (APPs -  Physician Assistants and Nurse Practitioners) who all work together to provide you with the care you need, when you need it.  We recommend signing up for the patient portal called "MyChart".  Sign up information is provided on this After Visit Summary.  MyChart is used to connect with patients for Virtual Visits (Telemedicine).  Patients are able to view lab/test results, encounter notes, upcoming appointments, etc.  Non-urgent messages can be sent to your provider as well.   To learn more about what you can do with MyChart, go to [email or web address].    Your next appointment:   6 month(s)  The format for your next appointment:   In Person  Provider:   Sanda Klein, MD {   Important Information About Sugar         Signed, Sanda Klein, MD  07/26/2021 1:39 PM    [address], [place], [place]  [address] Phone: [phone]; Fax: [phone]

## 2021-07-28 ENCOUNTER — Other Ambulatory Visit: Payer: Self-pay | Admitting: Physician Assistant

## 2021-07-29 DIAGNOSIS — C678 Malignant neoplasm of overlapping sites of bladder: Secondary | ICD-10-CM | POA: Diagnosis not present

## 2021-07-29 DIAGNOSIS — C652 Malignant neoplasm of left renal pelvis: Secondary | ICD-10-CM | POA: Diagnosis not present

## 2021-07-29 DIAGNOSIS — N184 Chronic kidney disease, stage 4 (severe): Secondary | ICD-10-CM | POA: Diagnosis not present

## 2021-07-29 DIAGNOSIS — C642 Malignant neoplasm of left kidney, except renal pelvis: Secondary | ICD-10-CM | POA: Diagnosis not present

## 2021-07-29 DIAGNOSIS — C661 Malignant neoplasm of right ureter: Secondary | ICD-10-CM | POA: Diagnosis not present

## 2021-07-29 DIAGNOSIS — Z5112 Encounter for antineoplastic immunotherapy: Secondary | ICD-10-CM | POA: Diagnosis not present

## 2021-07-29 DIAGNOSIS — K219 Gastro-esophageal reflux disease without esophagitis: Secondary | ICD-10-CM | POA: Diagnosis not present

## 2021-07-29 DIAGNOSIS — I4891 Unspecified atrial fibrillation: Secondary | ICD-10-CM | POA: Diagnosis not present

## 2021-07-29 DIAGNOSIS — C662 Malignant neoplasm of left ureter: Secondary | ICD-10-CM | POA: Diagnosis not present

## 2021-07-29 DIAGNOSIS — I129 Hypertensive chronic kidney disease with stage 1 through stage 4 chronic kidney disease, or unspecified chronic kidney disease: Secondary | ICD-10-CM | POA: Diagnosis not present

## 2021-08-03 ENCOUNTER — Other Ambulatory Visit (HOSPITAL_COMMUNITY): Payer: Self-pay | Admitting: Internal Medicine

## 2021-08-03 DIAGNOSIS — C649 Malignant neoplasm of unspecified kidney, except renal pelvis: Secondary | ICD-10-CM | POA: Diagnosis not present

## 2021-08-03 DIAGNOSIS — N390 Urinary tract infection, site not specified: Secondary | ICD-10-CM | POA: Diagnosis not present

## 2021-08-03 DIAGNOSIS — R5383 Other fatigue: Secondary | ICD-10-CM | POA: Diagnosis not present

## 2021-08-03 DIAGNOSIS — I350 Nonrheumatic aortic (valve) stenosis: Secondary | ICD-10-CM | POA: Diagnosis not present

## 2021-08-03 DIAGNOSIS — E785 Hyperlipidemia, unspecified: Secondary | ICD-10-CM | POA: Diagnosis not present

## 2021-08-03 DIAGNOSIS — Z936 Other artificial openings of urinary tract status: Secondary | ICD-10-CM | POA: Diagnosis not present

## 2021-08-03 DIAGNOSIS — N184 Chronic kidney disease, stage 4 (severe): Secondary | ICD-10-CM | POA: Diagnosis not present

## 2021-08-03 DIAGNOSIS — M79606 Pain in leg, unspecified: Secondary | ICD-10-CM

## 2021-08-03 DIAGNOSIS — R5381 Other malaise: Secondary | ICD-10-CM | POA: Diagnosis not present

## 2021-08-03 DIAGNOSIS — I10 Essential (primary) hypertension: Secondary | ICD-10-CM | POA: Diagnosis not present

## 2021-08-03 DIAGNOSIS — I480 Paroxysmal atrial fibrillation: Secondary | ICD-10-CM | POA: Diagnosis not present

## 2021-08-04 ENCOUNTER — Ambulatory Visit (HOSPITAL_COMMUNITY)
Admission: RE | Admit: 2021-08-04 | Discharge: 2021-08-04 | Disposition: A | Discharge status: Home / Self Care | Payer: Medicare PPO | Source: Ambulatory Visit | Attending: Internal Medicine | Admitting: Internal Medicine

## 2021-08-04 DIAGNOSIS — M79606 Pain in leg, unspecified: Secondary | ICD-10-CM | POA: Diagnosis not present

## 2021-08-04 DIAGNOSIS — M7989 Other specified soft tissue disorders: Secondary | ICD-10-CM | POA: Insufficient documentation

## 2021-08-06 ENCOUNTER — Telehealth: Payer: Self-pay | Admitting: *Deleted

## 2021-08-06 ENCOUNTER — Encounter: Payer: Self-pay | Admitting: Cardiology

## 2021-08-06 MED ORDER — APIXABAN 2.5 MG PO TABS: 2.5000 mg | ORAL_TABLET | Freq: Two times a day (BID) | ORAL | 11 refills | Status: DC

## 2021-08-06 NOTE — Telephone Encounter (Signed)
Patient called wanting to know if there was any assistance available for him to get Eliquis or can he get an alternate medication.

## 2021-08-06 NOTE — Telephone Encounter (Signed)
Placed samples and 30 day card at front desk for the patient to pick up.

## 2021-08-06 NOTE — Telephone Encounter (Signed)
Tawni Pummel,  Can we get apixaban 2.'5mg'$  PO BID ordered for Mr Agard to see how he tolerates the drug. Let's plan to see him in clinic 6-8 weeks after starting to see how he tolerates the drug.  Thanks,  Lars Mage         Notified patient of recommendation. Sent in Eliquis 2.5 mg BID. Made appointment with Dr. [name] on Sept 13 at 2:45 pm. Advised to call if he has any bleeding after medication start. Verbalized understanding and agreement.

## 2021-08-07 ENCOUNTER — Telehealth: Payer: Self-pay | Admitting: Cardiovascular Disease

## 2021-08-07 NOTE — Telephone Encounter (Signed)
That applies to patients with mechanical heart valves (metal valves) or rheumatic mitral stenosis (those patients should be on warfarin). Eliquis is OK with a TAVR valve.

## 2021-08-07 NOTE — Telephone Encounter (Signed)
Spoke to patient Dr.Croitoru's advice given. 

## 2021-08-07 NOTE — Telephone Encounter (Signed)
Spoke to patient he stated he was reading that you should not take Eliquis with a heart valve.He wanted to make sure Eliquis is safe to take. Jordan Oconnell I will send message to Dr.Croitoru for advice.

## 2021-08-07 NOTE — Telephone Encounter (Signed)
**Note De-Identified  Obfuscation** The pt states that his pharmacy advised him that Eliquis will cost him $40/30 day supply. This is the correct price for Eliquis with ins coverage.  We discussed him applying for pt asst for Eliquis through [employer] with my assistance but he declined and he did not want to discuss other anti-coagulant medications.  He stated that "$40 was a shock but I think I can afford it".  I provided the pt my name and asked that he call me back at [phone] if he changes his mind and wants to switch to another anti-coagulant medication or apply for asst in the future.  He thanked me for calling him back to discuss his options.

## 2021-08-07 NOTE — Telephone Encounter (Signed)
  Pt c/o medication issue:  1. Name of Medication:   apixaban (ELIQUIS) 2.5 MG TABS tablet    2. How are you currently taking this medication (dosage and times per day)? Take 1 tablet (2.5 mg total) by mouth 2 (two) times daily.  3. Are you having a reaction (difficulty breathing--STAT)? No   4. What is your medication issue? Pt said, he read about this medication and he read that this is not a good medication to the patients that have artificial heart valve. He said he had a TAVR last January and wanted to clarified if this is a good medication for him

## 2021-08-19 DIAGNOSIS — C649 Malignant neoplasm of unspecified kidney, except renal pelvis: Secondary | ICD-10-CM | POA: Diagnosis not present

## 2021-08-19 DIAGNOSIS — E785 Hyperlipidemia, unspecified: Secondary | ICD-10-CM | POA: Diagnosis not present

## 2021-08-19 DIAGNOSIS — R5381 Other malaise: Secondary | ICD-10-CM | POA: Diagnosis not present

## 2021-08-19 DIAGNOSIS — N184 Chronic kidney disease, stage 4 (severe): Secondary | ICD-10-CM | POA: Diagnosis not present

## 2021-08-19 DIAGNOSIS — I480 Paroxysmal atrial fibrillation: Secondary | ICD-10-CM | POA: Diagnosis not present

## 2021-08-19 DIAGNOSIS — R7301 Impaired fasting glucose: Secondary | ICD-10-CM | POA: Diagnosis not present

## 2021-08-19 DIAGNOSIS — I129 Hypertensive chronic kidney disease with stage 1 through stage 4 chronic kidney disease, or unspecified chronic kidney disease: Secondary | ICD-10-CM | POA: Diagnosis not present

## 2021-08-19 DIAGNOSIS — I10 Essential (primary) hypertension: Secondary | ICD-10-CM | POA: Diagnosis not present

## 2021-08-19 DIAGNOSIS — I350 Nonrheumatic aortic (valve) stenosis: Secondary | ICD-10-CM | POA: Diagnosis not present

## 2021-08-25 ENCOUNTER — Ambulatory Visit (INDEPENDENT_AMBULATORY_CARE_PROVIDER_SITE_OTHER): Payer: Medicare PPO

## 2021-08-25 DIAGNOSIS — I495 Sick sinus syndrome: Secondary | ICD-10-CM | POA: Diagnosis not present

## 2021-08-26 LAB — CUP PACEART REMOTE DEVICE CHECK
Battery Remaining Longevity: 138 mo
Battery Remaining Percentage: 100 %
Brady Statistic RA Percent Paced: 7 %
Brady Statistic RV Percent Paced: 70 %
Date Time Interrogation Session: 20230802102600
Implantable Lead Implant Date: 20230123
Implantable Lead Implant Date: 20230123
Implantable Lead Location: 753859
Implantable Lead Location: 753860
Implantable Lead Model: 7841
Implantable Lead Model: 7842
Implantable Lead Serial Number: 1101059
Implantable Lead Serial Number: 1169597
Implantable Pulse Generator Implant Date: 20230123
Lead Channel Impedance Value: 678 Ohm
Lead Channel Impedance Value: 719 Ohm
Lead Channel Pacing Threshold Amplitude: 0.9 V
Lead Channel Pacing Threshold Pulse Width: 0.4 ms
Lead Channel Setting Pacing Amplitude: 1.4 V
Lead Channel Setting Pacing Amplitude: 2.5 V
Lead Channel Setting Pacing Pulse Width: 0.4 ms
Lead Channel Setting Sensing Sensitivity: 2.5 mV
Pulse Gen Serial Number: 561085

## 2021-08-28 DIAGNOSIS — H25813 Combined forms of age-related cataract, bilateral: Secondary | ICD-10-CM | POA: Diagnosis not present

## 2021-08-28 DIAGNOSIS — H35033 Hypertensive retinopathy, bilateral: Secondary | ICD-10-CM | POA: Diagnosis not present

## 2021-08-28 DIAGNOSIS — H40023 Open angle with borderline findings, high risk, bilateral: Secondary | ICD-10-CM | POA: Diagnosis not present

## 2021-08-28 DIAGNOSIS — H25043 Posterior subcapsular polar age-related cataract, bilateral: Secondary | ICD-10-CM | POA: Diagnosis not present

## 2021-08-28 DIAGNOSIS — H353122 Nonexudative age-related macular degeneration, left eye, intermediate dry stage: Secondary | ICD-10-CM | POA: Diagnosis not present

## 2021-09-01 DIAGNOSIS — I10 Essential (primary) hypertension: Secondary | ICD-10-CM | POA: Diagnosis not present

## 2021-09-01 DIAGNOSIS — N184 Chronic kidney disease, stage 4 (severe): Secondary | ICD-10-CM | POA: Diagnosis not present

## 2021-09-01 DIAGNOSIS — Z905 Acquired absence of kidney: Secondary | ICD-10-CM | POA: Diagnosis not present

## 2021-09-08 DIAGNOSIS — R7989 Other specified abnormal findings of blood chemistry: Secondary | ICD-10-CM | POA: Diagnosis not present

## 2021-09-08 DIAGNOSIS — C661 Malignant neoplasm of right ureter: Secondary | ICD-10-CM | POA: Diagnosis not present

## 2021-09-08 DIAGNOSIS — Z5112 Encounter for antineoplastic immunotherapy: Secondary | ICD-10-CM | POA: Diagnosis not present

## 2021-09-08 DIAGNOSIS — C642 Malignant neoplasm of left kidney, except renal pelvis: Secondary | ICD-10-CM | POA: Diagnosis not present

## 2021-09-08 DIAGNOSIS — N184 Chronic kidney disease, stage 4 (severe): Secondary | ICD-10-CM | POA: Diagnosis not present

## 2021-09-08 DIAGNOSIS — K219 Gastro-esophageal reflux disease without esophagitis: Secondary | ICD-10-CM | POA: Diagnosis not present

## 2021-09-08 DIAGNOSIS — C678 Malignant neoplasm of overlapping sites of bladder: Secondary | ICD-10-CM | POA: Diagnosis not present

## 2021-09-08 DIAGNOSIS — I129 Hypertensive chronic kidney disease with stage 1 through stage 4 chronic kidney disease, or unspecified chronic kidney disease: Secondary | ICD-10-CM | POA: Diagnosis not present

## 2021-09-08 DIAGNOSIS — I4891 Unspecified atrial fibrillation: Secondary | ICD-10-CM | POA: Diagnosis not present

## 2021-09-08 DIAGNOSIS — C662 Malignant neoplasm of left ureter: Secondary | ICD-10-CM | POA: Diagnosis not present

## 2021-09-24 NOTE — Progress Notes (Signed)
Remote pacemaker transmission.   

## 2021-10-01 DIAGNOSIS — H353211 Exudative age-related macular degeneration, right eye, with active choroidal neovascularization: Secondary | ICD-10-CM | POA: Diagnosis not present

## 2021-10-01 DIAGNOSIS — H353122 Nonexudative age-related macular degeneration, left eye, intermediate dry stage: Secondary | ICD-10-CM | POA: Diagnosis not present

## 2021-10-01 DIAGNOSIS — H43813 Vitreous degeneration, bilateral: Secondary | ICD-10-CM | POA: Diagnosis not present

## 2021-10-01 DIAGNOSIS — H43393 Other vitreous opacities, bilateral: Secondary | ICD-10-CM | POA: Diagnosis not present

## 2021-10-07 ENCOUNTER — Ambulatory Visit: Payer: Medicare PPO | Attending: Cardiology | Admitting: Cardiology

## 2021-10-07 ENCOUNTER — Encounter: Payer: Self-pay | Admitting: Cardiology

## 2021-10-07 VITALS — BP 110/60 | HR 68 | Ht 73.0 in | Wt 204.2 lb

## 2021-10-07 DIAGNOSIS — Z952 Presence of prosthetic heart valve: Secondary | ICD-10-CM

## 2021-10-07 DIAGNOSIS — I350 Nonrheumatic aortic (valve) stenosis: Secondary | ICD-10-CM | POA: Diagnosis not present

## 2021-10-07 DIAGNOSIS — Z950 Presence of cardiac pacemaker: Secondary | ICD-10-CM | POA: Diagnosis not present

## 2021-10-07 DIAGNOSIS — I480 Paroxysmal atrial fibrillation: Secondary | ICD-10-CM

## 2021-10-07 MED ORDER — AMIODARONE HCL 200 MG PO TABS: ORAL_TABLET | ORAL | 3 refills | Status: DC

## 2021-10-07 NOTE — Progress Notes (Deleted)
Electrophysiology Office Follow up Visit Note:    Date:  10/07/2021   ID:  Jordan Oconnell, DOB [birth year], MRN [id number]  PCP:  Burnard Bunting, MD  [facility] Cardiologist:  Sanda Klein, MD  [facility] Electrophysiologist:  Vickie Epley, MD    Interval History:    Jordan Oconnell is a 81 y.o. male who presents for a follow up visit.  He has a dual-chamber permanent pacemaker that was implanted February 12, 2021 for tachybradycardia syndrome.  He also has a history of severe aortic stenosis post TAVR.  The patient also has a diagnosis of bladder cancer.  I last saw the patient Jun 16, 2021.  Device interrogations previously showed a significant burden of A-fib, 18%.  Given his ongoing bladder cancer, the patient declined anticoagulation.  He did take an aspirin 81 mg by mouth once daily.  At the last appointment there was an extensive discussion about anticoagulation and stroke risk.  After our appointment he discussed it further with his primary care physician and ultimately started Eliquis 5 mg by mouth twice daily for stroke prophylaxis.      Past Medical History:  Diagnosis Date   Bladder cancer ([place])    BPH (benign prostatic hyperplasia)    CKD (chronic kidney disease), stage IV (HCC)    Colon polyps    Diverticulosis    GERD (gastroesophageal reflux disease)    Hyperlipidemia    pt denies   Hypertension    PAF (paroxysmal atrial fibrillation) ([place])    on Flecanide and diltiazem. 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 Burt Knack and Dr. Cyndia Bent   Severe aortic stenosis 10/05/2020    Past Surgical History:  Procedure Laterality Date   APPENDECTOMY     BLADDER SURGERY  2008   transurethral resection/resection of prostatic urethra   BOWEL RESECTION     ESOPHAGOGASTRODUODENOSCOPY N/A 07/24/2013    Procedure: ESOPHAGOGASTRODUODENOSCOPY (EGD);  Surgeon: [name], MD;  Location: [name];  Service: Endoscopy;  Laterality: N/A;   INTRAOPERATIVE TRANSTHORACIC ECHOCARDIOGRAM N/A 01/27/2021   Procedure: INTRAOPERATIVE TRANSTHORACIC ECHOCARDIOGRAM;  Surgeon: Sherren Mocha, MD;  Location: [place] [facility];  Service: Open Heart Surgery;  Laterality: N/A;   laparoscopic surgery  2012   laser   (?)   NEPHRECTOMY  2007   partial, left   NEPHROSTOMY  2011   stent   NM MYOCAR PERF WALL MOTION  09/08/2010   Normal   PACEMAKER IMPLANT N/A 02/12/2021   Procedure: PACEMAKER IMPLANT;  Surgeon: Vickie Epley, MD;  Location: [place] [facility];  Service: Cardiovascular;  Laterality: N/A;   RIGHT HEART CATH AND CORONARY ANGIOGRAPHY N/A 10/06/2020   Procedure: RIGHT HEART CATH AND CORONARY ANGIOGRAPHY;  Surgeon: Sherren Mocha, MD;  Location: [place] [facility];  Service: Cardiovascular;  Laterality: N/A;   ROBOT ASSISTED LAPAROSCOPIC COMPLETE CYSTECT ILEAL CONDUIT     TRANSCATHETER AORTIC VALVE REPLACEMENT, TRANSFEMORAL Right 01/27/2021   Procedure: TRANSCATHETER AORTIC VALVE REPLACEMENT, TRANSFEMORAL;  Surgeon: Sherren Mocha, MD;  Location: [place] [facility];  Service: Open Heart Surgery;  Laterality: Right;   US ECHOCARDIOGRAPHY  4/96/7591   mild diastolic dysfunction,mild dilated LA,mild MR,AI,mildly dilated aortic root    Current Medications: No outpatient medications have been marked as taking for the 10/07/21 encounter (Appointment) with Lars Mage  T, MD.     Allergies:   Amoxicillin, Doxycycline, and Flomax [tamsulosin hcl]   Social History   Socioeconomic History   Marital status: Divorced    Spouse name: Not on file   Number of children: 2   Years of education: Not on file   Highest education level: Not on file  Occupational History   Occupation: Pharmacist, hospital  Tobacco Use   Smoking status: Never   Smokeless tobacco: Never  Vaping Use   Vaping Use: Never used   Substance and Sexual Activity   Alcohol use: Yes    Comment: occasionally   Drug use: No   Sexual activity: Not on file  Other Topics Concern   Not on file  Social History Narrative   Not on file   Social Determinants of Health   Financial Resource Strain: Not on file  Food Insecurity: Not on file  Transportation Needs: Not on file  Physical Activity: Not on file  Stress: Not on file  Social Connections: Not on file     Family History: The patient's family history includes Breast cancer in his daughter; Colon cancer in his brother, father, and mother; Crohn's disease in his son; Ovarian cancer in his mother.  ROS:   Please see the history of present illness.    All other systems reviewed and are negative.  EKGs/Labs/Other Studies Reviewed:    The following studies were reviewed today:  October 07, 2021 in clinic device interrogation personally reviewed ***   Recent Labs: 01/23/2021: ALT 13; B Natriuretic Peptide 720.2 01/28/2021: Magnesium 1.8 02/03/2021: BUN 61; Creatinine, Ser 2.57; Hemoglobin 13.6; Platelets 153; Potassium 5.0; Sodium 138  Recent Lipid Panel No results found for: "CHOL", "TRIG", "HDL", "CHOLHDL", "VLDL", "LDLCALC", "LDLDIRECT"  Physical Exam:    VS:  There were no vitals taken for this visit.    Wt Readings from Last 3 Encounters:  07/22/21 206 lb 6.4 oz (93.6 kg)  03/25/21 204 lb (92.5 kg)  02/20/21 197 lb 9.6 oz (89.6 kg)     GEN: *** Well nourished, well developed in no acute distress HEENT: Normal NECK: No JVD; No carotid bruits LYMPHATICS: No lymphadenopathy CARDIAC: ***RRR, no murmurs, rubs, gallops.  Prepectoral pocket well-healed RESPIRATORY:  Clear to auscultation without rales, wheezing or rhonchi  ABDOMEN: Soft, non-tender, non-distended MUSCULOSKELETAL:  No edema; No deformity  SKIN: Warm and dry NEUROLOGIC:  Alert and oriented x 3 PSYCHIATRIC:  Normal affect        ASSESSMENT:    1. PAF (paroxysmal atrial  fibrillation) ([place])   2. Severe aortic stenosis   3. S/P TAVR (transcatheter aortic valve replacement)   4. Pacemaker    PLAN:    In order of problems listed above:  #Paroxysmal atrial fibrillation On Eliquis for stroke prophylaxis Continue to monitor burden with pacemaker Rhythm control strategies have been discussed in the past.  The patient has opted for rate control.  #Severe aortic stenosis #Status post TAVR Doing well post TAVR.  #Permanent pacemaker in situ Device functioning appropriately.  Continue remote monitoring.  Follow-up 1 year or sooner as needed.  APP appointment okay.     Medication Adjustments/Labs and Tests Ordered: Current medicines are reviewed at length with the patient today.  Concerns regarding medicines are outlined above.  No orders of the defined types were placed in this encounter.  No orders of the defined types were placed in this encounter.    Signed, Lars Mage, MD, [facility], [facility] 10/07/2021 6:28 AM    Electrophysiology  [facility]

## 2021-10-07 NOTE — Patient Instructions (Addendum)
Medication Instructions:  Stop Lexapro then in three days Start Amiodarone 200 mg two times a day for 14 days then 200 mg daily. *If you need a refill on your cardiac medications before your next appointment, please call your pharmacy*   Lab Work: TSH, Free T4, CMP If you have labs (blood work) drawn today and your tests are completely normal, you will receive your results only by: [place] (if you have MyChart) OR A paper copy in the mail If you have any lab test that is abnormal or we need to change your treatment, we will call you to review the results.   Testing/Procedures: None    Follow-Up: At [facility], you and your health needs are our priority.  As part of our continuing mission to provide you with exceptional heart care, we have created designated Provider Care Teams.  These Care Teams include your primary Cardiologist (physician) and Advanced Practice Providers (APPs -  Physician Assistants and Nurse Practitioners) who all work together to provide you with the care you need, when you need it.  We recommend signing up for the patient portal called "MyChart".  Sign up information is provided on this After Visit Summary.  MyChart is used to connect with patients for Virtual Visits (Telemedicine).  Patients are able to view lab/test results, encounter notes, upcoming appointments, etc.  Non-urgent messages can be sent to your provider as well.   To learn more about what you can do with MyChart, go to [email or web address].    Your next appointment:   8 week(s)  The format for your next appointment:   In Person  Provider:   You will see one of the following Advanced Practice Providers on your designated Care Team:    Legrand Como "Jonni Sanger" Chalmers Cater, [place]      Other Instructions none  Important Information About [place]

## 2021-10-07 NOTE — Progress Notes (Signed)
Electrophysiology Office Follow up Visit Note:    Date:  10/07/2021   ID:  ERROLL WILBOURNE, DOB Jan 25, 1941, MRN [id number]  PCP:  Burnard Bunting, MD  [facility] Cardiologist:  Sanda Klein, MD  [facility] Electrophysiologist:  Vickie Epley, MD    Interval History:    Jordan Oconnell is a 81 y.o. male who presents for a follow up visit. He has a dual-chamber permanent pacemaker that was implanted February 12, 2021 for tachybradycardia syndrome.  He also has a history of severe aortic stenosis post TAVR.  The patient also has a diagnosis of bladder cancer.   I last saw the patient Jun 16, 2021.  Device interrogations previously showed a significant burden of A-fib, 18%.  Given his ongoing bladder cancer, the patient declined anticoagulation.  He did take an aspirin 81 mg by mouth once daily.  At the last appointment there was an extensive discussion about anticoagulation and stroke risk.  After our appointment he discussed it further with his primary care physician and ultimately started Eliquis 5 mg by mouth twice daily for stroke prophylaxis.  Today, he is concerned that he is "chugging" by the end of his walking exercise. Previously this was not a significant issue. Sometimes he has a wave of fatigue after waking up in the morning, preventing his morning walks.  Currently his regimen includes Keytruda. He also remains compliant with Eliquis.  He denies any palpitations, chest pain, shortness of breath, or peripheral edema. No lightheadedness, headaches, syncope, orthopnea, or PND.      Past Medical History:  Diagnosis Date   Bladder cancer ([place])    BPH (benign prostatic hyperplasia)    CKD (chronic kidney disease), stage IV (HCC)    Colon polyps    Diverticulosis    GERD (gastroesophageal reflux disease)    Hyperlipidemia    pt denies   Hypertension    PAF (paroxysmal atrial fibrillation) ([place])    on Flecanide and diltiazem. 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 Burt Knack and Dr. Cyndia Bent   Severe aortic stenosis 10/05/2020    Past Surgical History:  Procedure Laterality Date   APPENDECTOMY     BLADDER SURGERY  2008   transurethral resection/resection of prostatic urethra   BOWEL RESECTION     ESOPHAGOGASTRODUODENOSCOPY N/A 07/24/2013   Procedure: ESOPHAGOGASTRODUODENOSCOPY (EGD);  Surgeon: [name], MD;  Location: [name];  Service: Endoscopy;  Laterality: N/A;   INTRAOPERATIVE TRANSTHORACIC ECHOCARDIOGRAM N/A 01/27/2021   Procedure: INTRAOPERATIVE TRANSTHORACIC ECHOCARDIOGRAM;  Surgeon: Sherren Mocha, MD;  Location: [place] [facility];  Service: Open Heart Surgery;  Laterality: N/A;   laparoscopic surgery  2012   laser   (?)   NEPHRECTOMY  2007   partial, left   NEPHROSTOMY  2011   stent   NM MYOCAR PERF WALL MOTION  09/08/2010   Normal   PACEMAKER IMPLANT N/A 02/12/2021   Procedure: PACEMAKER IMPLANT;  Surgeon: Vickie Epley, MD;  Location: [place] [facility];  Service: Cardiovascular;  Laterality: N/A;   RIGHT HEART CATH AND CORONARY ANGIOGRAPHY N/A 10/06/2020   Procedure: RIGHT HEART CATH AND CORONARY ANGIOGRAPHY;  Surgeon: Sherren Mocha, MD;  Location: [place] [facility];  Service: Cardiovascular;  Laterality: N/A;   ROBOT ASSISTED LAPAROSCOPIC COMPLETE CYSTECT ILEAL CONDUIT  TRANSCATHETER AORTIC VALVE REPLACEMENT, TRANSFEMORAL Right 01/27/2021   Procedure: TRANSCATHETER AORTIC VALVE REPLACEMENT, TRANSFEMORAL;  Surgeon: Sherren Mocha, MD;  Location: [place] [facility];  Service: Open Heart Surgery;  Laterality: Right;   US ECHOCARDIOGRAPHY  7/84/6962   mild diastolic dysfunction,mild dilated LA,mild MR,AI,mildly dilated aortic root    Current Medications: Current Meds  Medication Sig   ALPRAZolam (XANAX) 0.25 MG tablet Take 0.25 mg by mouth at bedtime.    amiodarone (PACERONE) 200 MG tablet Take 1 tablet (200 mg total) by mouth 2 (two) times daily for 14 days, THEN 1 tablet (200 mg total) daily.   apixaban (ELIQUIS) 2.5 MG TABS tablet Take 1 tablet (2.5 mg total) by mouth 2 (two) times daily.   azithromycin (ZITHROMAX) 500 MG tablet Take 1 tablet by mouth 1 hour before dental procedures and cleanings   diltiazem (CARDIZEM CD) 180 MG 24 hr capsule Take 1 capsule by mouth twice daily   hydrALAZINE (APRESOLINE) 50 MG tablet Take 50 mg by mouth 2 (two) times daily.   latanoprost (XALATAN) 0.005 % ophthalmic solution Place 1 drop into both eyes at bedtime.   metoprolol succinate (TOPROL-XL) 50 MG 24 hr tablet Take 0.5 tablets (25 mg total) by mouth in the morning and at bedtime. Take with or immediately following a meal.   mirtazapine (REMERON) 15 MG tablet Take 7.5 mg by mouth at bedtime.   Pembrolizumab (KEYTRUDA IV) Inject into the vein every 6 (six) weeks. Infusion   sodium bicarbonate 650 MG tablet Take 1,300 mg by mouth 2 (two) times daily.   [DISCONTINUED] amiodarone (PACERONE) 200 MG tablet Take 2 tablets (400 mg total) by mouth daily for 14 days, THEN 1 tablet (200 mg total) daily.   [DISCONTINUED] escitalopram (LEXAPRO) 10 MG tablet Take 10 mg by mouth every morning.     Allergies:   Amoxicillin, Doxycycline, and Flomax [tamsulosin hcl]   Social History   Socioeconomic History   Marital status: Divorced    Spouse name: Not on file   Number of children: 2   Years of education: Not on file   Highest education level: Not on file  Occupational History   Occupation: Pharmacist, hospital  Tobacco Use   Smoking status: Never   Smokeless tobacco: Never  Vaping Use   Vaping Use: Never used  Substance and Sexual Activity   Alcohol use: Yes    Comment: occasionally   Drug use: No   Sexual activity: Not on file  Other Topics Concern   Not on file  Social History Narrative   Not on file   Social Determinants of Health   Financial Resource  Strain: Not on file  Food Insecurity: Not on file  Transportation Needs: Not on file  Physical Activity: Not on file  Stress: Not on file  Social Connections: Not on file     Family History: The patient's family history includes Breast cancer in his daughter; Colon cancer in his brother, father, and mother; Crohn's disease in his son; Ovarian cancer in his mother.  ROS:   Please see the history of present illness.    (+) Fatigue/Malaise All other systems reviewed and are negative.  EKGs/Labs/Other Studies Reviewed:    The following studies were reviewed today:  October 07, 2021 in clinic device interrogation personally reviewed: Battery longevity 12 years Lead parameters stable 7% A pacing 37.6% V pacing 34% AF Turned rate response ON AV delay extended to 341m from 2568mto allow for less V pacing   EKG:  EKG is personally reviewed.  10/07/2021:  EKG was not ordered.  Recent Labs: 01/23/2021: ALT 13; B Natriuretic Peptide 720.2 01/28/2021: Magnesium 1.8 02/03/2021: BUN 61; Creatinine, Ser 2.57; Hemoglobin 13.6; Platelets 153; Potassium 5.0; Sodium 138   Recent Lipid Panel No results found for: "CHOL", "TRIG", "HDL", "CHOLHDL", "VLDL", "LDLCALC", "LDLDIRECT"  Physical Exam:    VS:  BP 110/60   Pulse 68   Ht 6' 1"  (1.854 m)   Wt 204 lb 3.2 oz (92.6 kg)   SpO2 97%   BMI 26.94 kg/m     Wt Readings from Last 3 Encounters:  10/07/21 204 lb 3.2 oz (92.6 kg)  07/22/21 206 lb 6.4 oz (93.6 kg)  03/25/21 204 lb (92.5 kg)     GEN: Well nourished, well developed in no acute distress HEENT: Normal NECK: No JVD; No carotid bruits LYMPHATICS: No lymphadenopathy CARDIAC: RRR, no murmurs, rubs, gallops. Prepectoral pocket well-healed. RESPIRATORY:  Clear to auscultation without rales, wheezing or rhonchi  ABDOMEN: Soft, non-tender, non-distended MUSCULOSKELETAL:  No edema; No deformity  SKIN: Warm and dry NEUROLOGIC:  Alert and oriented x 3 PSYCHIATRIC:  Normal affect         ASSESSMENT:    1. PAF (paroxysmal atrial fibrillation) ([place])   2. Severe aortic stenosis   3. S/P TAVR (transcatheter aortic valve replacement)   4. Pacemaker    PLAN:    In order of problems listed above:  #Paroxysmal atrial fibrillation On Eliquis for stroke prophylaxis. Burden of AF has increased and I suspect is contributing to some of his symptoms. Start amiodarone 268m PO BID x 14 days followed by 2089mPO daily. Check CMP, TSH and FT4 today. Plan for f/u in 8 weeks with APP.   #Severe aortic stenosis #Status post TAVR Doing well post TAVR.   #Permanent pacemaker in situ Device functioning appropriately.  Continue remote monitoring.    Medication Adjustments/Labs and Tests Ordered: Current medicines are reviewed at length with the patient today.  Concerns regarding medicines are outlined above.   Orders Placed This Encounter  Procedures   TSH   T4, free   Comp Met (CMET)   Meds ordered this encounter  Medications   DISCONTD: amiodarone (PACERONE) 200 MG tablet    Sig: Take 2 tablets (400 mg total) by mouth daily for 14 days, THEN 1 tablet (200 mg total) daily.    Dispense:  90 tablet    Refill:  3   amiodarone (PACERONE) 200 MG tablet    Sig: Take 1 tablet (200 mg total) by mouth 2 (two) times daily for 14 days, THEN 1 tablet (200 mg total) daily.    Dispense:  90 tablet    Refill:  3    Redo dosing.   I,Mathew Stumpf,acting as a scEducation administratoror CAVickie EpleyMD.,have documented all relevant documentation on the behalf of CAVickie EpleyMD,as directed by  CAVickie EpleyMD while in the presence of CAVickie EpleyMD.  I, CAVickie EpleyMD, have reviewed all documentation for this visit. The documentation on 10/07/21 for the exam, diagnosis, procedures, and orders are all accurate and complete.   Signed, CaLars MageMD, FABay Ridge Hospital BeverlyFHWilliamson Surgery Center/13/2023 10:05 PM    Electrophysiology [facility]

## 2021-10-08 LAB — COMPREHENSIVE METABOLIC PANEL
ALT: 21 IU/L (ref 0–44)
AST: 26 IU/L (ref 0–40)
Albumin/Globulin Ratio: 1.8 (ref 1.2–2.2)
Albumin: 4.2 g/dL (ref 3.7–4.7)
Alkaline Phosphatase: 47 IU/L (ref 44–121)
BUN/Creatinine Ratio: 22 (ref 10–24)
BUN: 55 mg/dL — ABNORMAL HIGH (ref 8–27)
Bilirubin Total: 0.4 mg/dL (ref 0.0–1.2)
CO2: 17 mmol/L — ABNORMAL LOW (ref 20–29)
Calcium: 9.6 mg/dL (ref 8.6–10.2)
Chloride: 106 mmol/L (ref 96–106)
Creatinine, Ser: 2.51 mg/dL — ABNORMAL HIGH (ref 0.76–1.27)
Globulin, Total: 2.4 g/dL (ref 1.5–4.5)
Glucose: 93 mg/dL (ref 70–99)
Potassium: 4.8 mmol/L (ref 3.5–5.2)
Sodium: 138 mmol/L (ref 134–144)
Total Protein: 6.6 g/dL (ref 6.0–8.5)
eGFR: 25 mL/min/{1.73_m2} — ABNORMAL LOW (ref >59)

## 2021-10-08 LAB — TSH: TSH: 1.98 u[IU]/mL (ref 0.450–4.500)

## 2021-10-08 LAB — T4, FREE: Free T4: 1.14 ng/dL (ref 0.82–1.77)

## 2021-10-15 DIAGNOSIS — H353211 Exudative age-related macular degeneration, right eye, with active choroidal neovascularization: Secondary | ICD-10-CM | POA: Diagnosis not present

## 2021-10-22 DIAGNOSIS — N184 Chronic kidney disease, stage 4 (severe): Secondary | ICD-10-CM | POA: Diagnosis not present

## 2021-10-22 DIAGNOSIS — C679 Malignant neoplasm of bladder, unspecified: Secondary | ICD-10-CM | POA: Diagnosis not present

## 2021-10-22 DIAGNOSIS — Z5112 Encounter for antineoplastic immunotherapy: Secondary | ICD-10-CM | POA: Diagnosis not present

## 2021-10-22 DIAGNOSIS — C642 Malignant neoplasm of left kidney, except renal pelvis: Secondary | ICD-10-CM | POA: Diagnosis not present

## 2021-10-22 DIAGNOSIS — C678 Malignant neoplasm of overlapping sites of bladder: Secondary | ICD-10-CM | POA: Diagnosis not present

## 2021-10-22 DIAGNOSIS — I4891 Unspecified atrial fibrillation: Secondary | ICD-10-CM | POA: Diagnosis not present

## 2021-10-22 DIAGNOSIS — C661 Malignant neoplasm of right ureter: Secondary | ICD-10-CM | POA: Diagnosis not present

## 2021-10-22 DIAGNOSIS — C662 Malignant neoplasm of left ureter: Secondary | ICD-10-CM | POA: Diagnosis not present

## 2021-10-22 DIAGNOSIS — I129 Hypertensive chronic kidney disease with stage 1 through stage 4 chronic kidney disease, or unspecified chronic kidney disease: Secondary | ICD-10-CM | POA: Diagnosis not present

## 2021-10-22 DIAGNOSIS — Z23 Encounter for immunization: Secondary | ICD-10-CM | POA: Diagnosis not present

## 2021-11-09 DIAGNOSIS — C661 Malignant neoplasm of right ureter: Secondary | ICD-10-CM | POA: Diagnosis not present

## 2021-11-16 DIAGNOSIS — C642 Malignant neoplasm of left kidney, except renal pelvis: Secondary | ICD-10-CM | POA: Diagnosis not present

## 2021-11-16 DIAGNOSIS — C661 Malignant neoplasm of right ureter: Secondary | ICD-10-CM | POA: Diagnosis not present

## 2021-11-19 DIAGNOSIS — C662 Malignant neoplasm of left ureter: Secondary | ICD-10-CM | POA: Diagnosis not present

## 2021-11-20 ENCOUNTER — Telehealth: Payer: Self-pay | Admitting: Cardiology

## 2021-11-20 NOTE — Telephone Encounter (Signed)
There is not a separate number to call back to [facility].  The number attached to this note is patient's cell number, which I did just call and LM to call us back.

## 2021-11-20 NOTE — Telephone Encounter (Signed)
Spoke with patient.  He is not sure but thinks that [facility] may be trying to call about clearance for upcoming radiation treatment that he will need.   Patient gave me [phone] [facility] Oncology/Radiology) number to reach out to Dr. Valerie Salts who is his oncologist.  The number above is incorrect but I did find the office number for Dr. Valerie Salts and reached out to his office directly at [phone].   Secretary will pass along that we are returning call to the NP covering for Dr. Valerie Salts.  If just needing radiation clearance, they now have our fax number and will send [place] the request form.  If they need to discuss further, the NP or nurse will contact us back at our device clinic number, otherwise will await form.

## 2021-11-20 NOTE — Telephone Encounter (Signed)
[facility] is calling to talk with Dr. [name] or nurse in regards to the patient's pacemaker. Please call back to discuss

## 2021-11-20 NOTE — Telephone Encounter (Signed)
The patient LMOVM returning nurse call. His phone number is [phone].

## 2021-11-24 ENCOUNTER — Ambulatory Visit: Payer: Medicare PPO

## 2021-11-24 LAB — CUP PACEART REMOTE DEVICE CHECK
Battery Remaining Longevity: 144 mo
Battery Remaining Percentage: 100 %
Brady Statistic RA Percent Paced: 1 %
Brady Statistic RV Percent Paced: 4 %
Date Time Interrogation Session: 20231031044100
Implantable Lead Connection Status: 753985
Implantable Lead Connection Status: 753985
Implantable Lead Implant Date: 20230123
Implantable Lead Implant Date: 20230123
Implantable Lead Location: 753859
Implantable Lead Location: 753860
Implantable Lead Model: 7841
Implantable Lead Model: 7842
Implantable Lead Serial Number: 1101059
Implantable Lead Serial Number: 1169597
Implantable Pulse Generator Implant Date: 20230123
Lead Channel Impedance Value: 619 Ohm
Lead Channel Impedance Value: 692 Ohm
Lead Channel Pacing Threshold Amplitude: 0.5 V
Lead Channel Pacing Threshold Amplitude: 1 V
Lead Channel Pacing Threshold Pulse Width: 0.4 ms
Lead Channel Pacing Threshold Pulse Width: 0.4 ms
Lead Channel Setting Pacing Amplitude: 1.5 V
Lead Channel Setting Pacing Amplitude: 2.5 V
Lead Channel Setting Pacing Pulse Width: 0.4 ms
Lead Channel Setting Sensing Sensitivity: 2.5 mV
Pulse Gen Serial Number: 561085
Zone Setting Status: 755011

## 2021-11-25 DIAGNOSIS — C661 Malignant neoplasm of right ureter: Secondary | ICD-10-CM | POA: Diagnosis not present

## 2021-11-25 DIAGNOSIS — R7301 Impaired fasting glucose: Secondary | ICD-10-CM | POA: Diagnosis not present

## 2021-11-25 DIAGNOSIS — C649 Malignant neoplasm of unspecified kidney, except renal pelvis: Secondary | ICD-10-CM | POA: Diagnosis not present

## 2021-11-25 DIAGNOSIS — I129 Hypertensive chronic kidney disease with stage 1 through stage 4 chronic kidney disease, or unspecified chronic kidney disease: Secondary | ICD-10-CM | POA: Diagnosis not present

## 2021-11-25 DIAGNOSIS — Z23 Encounter for immunization: Secondary | ICD-10-CM | POA: Diagnosis not present

## 2021-11-30 DIAGNOSIS — C661 Malignant neoplasm of right ureter: Secondary | ICD-10-CM | POA: Diagnosis not present

## 2021-12-01 DIAGNOSIS — C661 Malignant neoplasm of right ureter: Secondary | ICD-10-CM | POA: Diagnosis not present

## 2021-12-02 ENCOUNTER — Encounter: Payer: Medicare PPO | Admitting: Student

## 2021-12-02 DIAGNOSIS — C661 Malignant neoplasm of right ureter: Secondary | ICD-10-CM | POA: Diagnosis not present

## 2021-12-03 DIAGNOSIS — Z8554 Personal history of malignant neoplasm of ureter: Secondary | ICD-10-CM | POA: Diagnosis not present

## 2021-12-03 DIAGNOSIS — Z8553 Personal history of malignant neoplasm of renal pelvis: Secondary | ICD-10-CM | POA: Diagnosis not present

## 2021-12-03 DIAGNOSIS — C642 Malignant neoplasm of left kidney, except renal pelvis: Secondary | ICD-10-CM | POA: Diagnosis not present

## 2021-12-03 DIAGNOSIS — C678 Malignant neoplasm of overlapping sites of bladder: Secondary | ICD-10-CM | POA: Diagnosis not present

## 2021-12-03 DIAGNOSIS — C662 Malignant neoplasm of left ureter: Secondary | ICD-10-CM | POA: Diagnosis not present

## 2021-12-03 DIAGNOSIS — C661 Malignant neoplasm of right ureter: Secondary | ICD-10-CM | POA: Diagnosis not present

## 2021-12-03 DIAGNOSIS — N184 Chronic kidney disease, stage 4 (severe): Secondary | ICD-10-CM | POA: Diagnosis not present

## 2021-12-03 DIAGNOSIS — Z510 Encounter for antineoplastic radiation therapy: Secondary | ICD-10-CM | POA: Diagnosis not present

## 2021-12-03 DIAGNOSIS — Z905 Acquired absence of kidney: Secondary | ICD-10-CM | POA: Diagnosis not present

## 2021-12-03 DIAGNOSIS — Z5112 Encounter for antineoplastic immunotherapy: Secondary | ICD-10-CM | POA: Diagnosis not present

## 2021-12-03 DIAGNOSIS — I129 Hypertensive chronic kidney disease with stage 1 through stage 4 chronic kidney disease, or unspecified chronic kidney disease: Secondary | ICD-10-CM | POA: Diagnosis not present

## 2021-12-04 DIAGNOSIS — C661 Malignant neoplasm of right ureter: Secondary | ICD-10-CM | POA: Diagnosis not present

## 2021-12-07 DIAGNOSIS — H43813 Vitreous degeneration, bilateral: Secondary | ICD-10-CM | POA: Diagnosis not present

## 2021-12-07 DIAGNOSIS — H353211 Exudative age-related macular degeneration, right eye, with active choroidal neovascularization: Secondary | ICD-10-CM | POA: Diagnosis not present

## 2021-12-07 DIAGNOSIS — H353122 Nonexudative age-related macular degeneration, left eye, intermediate dry stage: Secondary | ICD-10-CM | POA: Diagnosis not present

## 2021-12-07 DIAGNOSIS — H35033 Hypertensive retinopathy, bilateral: Secondary | ICD-10-CM | POA: Diagnosis not present

## 2021-12-07 DIAGNOSIS — H43393 Other vitreous opacities, bilateral: Secondary | ICD-10-CM | POA: Diagnosis not present

## 2021-12-20 NOTE — Progress Notes (Addendum)
Cardiology Office Note Date:  12/20/2021  Patient ID:  Jordan Oconnell October 27, 1940, MRN [id number] PCP:  Burnard Bunting, MD  Cardiologist:  Dr. Sallyanne Kuster Electrophysiologist: Dr. [name]   Chief Complaint:  planned 8 week visit  History of Present Illness: Jordan Oconnell is a 81 y.o. 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 Loletha Grayer June 2023, 36% AFib burden, device functioning normally. With his hx of hematuria and urothelial/renal cancers, not felt a great a/c candidate though discussed likely to be able to tolerate long enough to consider watchman.  Discussed perhaps 2.'5mg'$  dosing of Eliquis  He was subsequently started on Eliquis '5mg'$  BID via his PMD.  He saw Dr. [name] 10/07/21, pt reported easier fatigued, suspect 2/2 increased Afib burden, started on amiodarone '200mg'$  BID x2 weeks then daily with plans for EP APP f/u in 8 weeks. I do not see any discussion on potential watchman.  TODAY He feels quite well, much better. He is back to walking his usual 2.7 miles a day, the cooler weather also helps his exertional capacity he thinks. Noted in the hot weather he felt worse. No CP, palpitations or any cardiac awareness. No SOB, DOE No near syncope or syncope.  He had XRT a few weeks ago and last week started seeing gross blood in his urine that persists. He was started on Cipro Friday by his PMD, did speak with his [facility] cancer team, and they agreed with the antibiotic, in fact felt since he was planning a trip to [place] for a couple weeks that he should take enough for to full regimes incase it did not clear. He was not advised to stop his Eliquis. He was told that blood in the urine after  radiation is not uncommon.  He reports good eliquis/medication compliance. He is on amiodarone '200mg'$  daily for weeks now.   Device information BSCi dual chamber PPM implanted 02/16/21  AAD Hx Hx of flecainide stopped 2/2 CAD by cath Amiodarone started Sept 2023  Past Medical History:  Diagnosis Date   Bladder cancer ([place])    BPH (benign prostatic hyperplasia)    CKD (chronic kidney disease), stage IV (HCC)    Colon polyps    Diverticulosis    GERD (gastroesophageal reflux disease)    Hyperlipidemia    pt denies   Hypertension    PAF (paroxysmal atrial fibrillation) ([place])    on Flecanide and diltiazem. 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 Burt Knack and Dr. Cyndia Bent   Severe aortic stenosis 10/05/2020    Past Surgical History:  Procedure Laterality Date   APPENDECTOMY     BLADDER SURGERY  2008   transurethral resection/resection of prostatic urethra   BOWEL RESECTION     ESOPHAGOGASTRODUODENOSCOPY N/A 07/24/2013   Procedure: ESOPHAGOGASTRODUODENOSCOPY (EGD);  Surgeon: [name], MD;  Location: [name];  Service: Endoscopy;  Laterality: N/A;   INTRAOPERATIVE TRANSTHORACIC ECHOCARDIOGRAM N/A 01/27/2021   Procedure: INTRAOPERATIVE TRANSTHORACIC ECHOCARDIOGRAM;  Surgeon: Sherren Mocha, MD;  Location: [facility];  Service: Open Heart Surgery;  Laterality:  N/A;   laparoscopic surgery  2012   laser   (?)   NEPHRECTOMY  2007   partial, left   NEPHROSTOMY  2011   stent   NM MYOCAR PERF WALL MOTION  09/08/2010   Normal   PACEMAKER IMPLANT N/A 02/12/2021   Procedure: PACEMAKER IMPLANT;  Surgeon: Vickie Epley, MD;  Location: [facility];  Service: Cardiovascular;  Laterality: N/A;   RIGHT HEART CATH AND CORONARY ANGIOGRAPHY N/A 10/06/2020   Procedure: RIGHT HEART CATH AND CORONARY ANGIOGRAPHY;   Surgeon: Sherren Mocha, MD;  Location: [facility];  Service: Cardiovascular;  Laterality: N/A;   ROBOT ASSISTED LAPAROSCOPIC COMPLETE CYSTECT ILEAL CONDUIT     TRANSCATHETER AORTIC VALVE REPLACEMENT, TRANSFEMORAL Right 01/27/2021   Procedure: TRANSCATHETER AORTIC VALVE REPLACEMENT, TRANSFEMORAL;  Surgeon: Sherren Mocha, MD;  Location: [facility];  Service: Open Heart Surgery;  Laterality: Right;   US ECHOCARDIOGRAPHY  7/61/9509   mild diastolic dysfunction,mild dilated LA,mild MR,AI,mildly dilated aortic root    Current Outpatient Medications  Medication Sig Dispense Refill   ALPRAZolam (XANAX) 0.25 MG tablet Take 0.25 mg by mouth at bedtime.     amiodarone (PACERONE) 200 MG tablet Take 1 tablet (200 mg total) by mouth 2 (two) times daily for 14 days, THEN 1 tablet (200 mg total) daily. 90 tablet 3   apixaban (ELIQUIS) 2.5 MG TABS tablet Take 1 tablet (2.5 mg total) by mouth 2 (two) times daily. 60 tablet 11   azithromycin (ZITHROMAX) 500 MG tablet Take 1 tablet by mouth 1 hour before dental procedures and cleanings 12 tablet 6   diltiazem (CARDIZEM CD) 180 MG 24 hr capsule Take 1 capsule by mouth twice daily 180 capsule 3   hydrALAZINE (APRESOLINE) 50 MG tablet Take 50 mg by mouth 2 (two) times daily.     latanoprost (XALATAN) 0.005 % ophthalmic solution Place 1 drop into both eyes at bedtime.     metoprolol succinate (TOPROL-XL) 50 MG 24 hr tablet Take 0.5 tablets (25 mg total) by mouth in the morning and at bedtime. Take with or immediately following a meal. 90 tablet 3   mirtazapine (REMERON) 15 MG tablet Take 7.5 mg by mouth at bedtime.     omeprazole (PRILOSEC) 20 MG capsule Take 20 mg by mouth daily. (Patient not taking: Reported on 10/07/2021)     Pembrolizumab (KEYTRUDA IV) Inject into the vein every 6 (six) weeks. Infusion     sodium bicarbonate 650 MG tablet Take 1,300 mg by mouth 2 (two) times daily.     No current facility-administered medications for this visit.     Allergies:   Amoxicillin, Doxycycline, and Flomax [tamsulosin hcl]   Social History:  The patient  reports that he has never smoked. He has never used smokeless tobacco. He reports current alcohol use. He reports that he does not use drugs.   Family History:  The patient's family history includes Breast cancer in his daughter; Colon cancer in his brother, father, and mother; Crohn's disease in his son; Ovarian cancer in his mother.  ROS:  Please see the history of present illness.    All other systems are reviewed and otherwise negative.   PHYSICAL EXAM:  VS:  There were no vitals taken for this visit. BMI: There is no height or weight on file to calculate BMI. Well nourished, well developed, in no acute distress HEENT: normocephalic, atraumatic Neck: no JVD, carotid bruits or masses Cardiac:  RRR; Tachycardic) no significant murmurs, no rubs,  or gallops Lungs:  CTA b/l, no wheezing, rhonchi or rales Abd: soft, nontender MS: no deformity or atrophy Ext: no edema Skin: warm and dry, no rash Neuro:  No gross deficits appreciated Psych: euthymic mood, full affect  PPM site is stable, no tethering or discomfort   EKG:  Done today and reviewed by myself shows  AFlutter, 114bpm, IVCD appears unchanged  Device interrogation done today and reviewed by myself:  Battery and lead measurements are good He arrives in AFlutter/VS 112bpm Device reports this episode in progress started this AM, though by EGMs/I suspect has been for a couple days at least HR histograms look pretty good, fast majority of HRs are good, he exercises regularly with some HR 100-120 Episodes labeled NSVT are faster AFlutter rates, not true VT   02/20/2021: TTE  1. Abnormal septal motion . Left ventricular ejection fraction, by  estimation, is 55%. The left ventricle has normal function. The left  ventricle has no regional wall motion abnormalities. Left ventricular  diastolic parameters were normal.   2.  Pacing wires in RA/RV. Right ventricular systolic function is normal.  The right ventricular size is normal.   3. Left atrial size was moderately dilated.   4. The mitral valve is abnormal. Mild mitral valve regurgitation. No  evidence of mitral stenosis.   5. Post TAVR with 29 mm Sapien 3 valve no significant PVL gradients have  increased since previous echo . The aortic valve has been  repaired/replaced. Aortic valve regurgitation is not visualized. No aortic  stenosis is present.   6. Aortic dilatation noted. There is moderate dilatation of the ascending  aorta, measuring 43 mm.   7. The inferior vena cava is normal in size with greater than 50%  respiratory variability, suggesting right atrial pressure of 3 mmHg.   Cardiac catheterization September 2022   Ramus lesion is 60% stenosed.   1st Diag lesion is 90% stenosed.   2nd Diag lesion is 50% stenosed.   Prox LAD to Mid LAD lesion is 30% stenosed.   Mid Cx lesion is 30% stenosed.   1.  Nonobstructive coronary artery disease with separate LAD and left circumflex ostia, patent LAD with mild nonobstructive plaquing, severe ostial diagonal stenoses, patent left circumflex with moderate intermediate branch stenosis and no other significant stenoses, patent nondominant RCA 2.  Known severe aortic stenosis with heavy calcification and restriction of the aortic valve leaflets seen on plain fluoroscopy 3.  Normal right heart hemodynamics with preserved cardiac output, normal pulmonary pressures, and normal LVEDP   Echocardiogram 02/16/2021   1. Abnormal septal motion . Left ventricular ejection fraction, by  estimation, is 55%. The left ventricle has normal function. The left  ventricle has no regional wall motion abnormalities. Left ventricular diastolic parameters were normal.   2. Pacing wires in RA/RV. Right ventricular systolic function is normal. The right ventricular size is normal.   3. Left atrial size was moderately dilated.    4. The mitral valve is abnormal. Mild mitral valve regurgitation. No evidence of mitral stenosis.   5. Post TAVR with 29 mm Sapien 3 valve no significant PVL gradients have  increased since previous echo . The aortic valve has been  repaired/replaced. Aortic valve regurgitation is not visualized. No aortic stenosis is present.   6. Aortic dilatation noted. There is moderate dilatation of the ascending aorta, measuring 43 mm.   7. The inferior vena cava is normal in size with greater than 50%  respiratory variability, suggesting right atrial  pressure of 3 mmHg.    CT chest/aorta October 2022 1. Tricuspid aortic valve with acquired fusion of the RCC/LCC. Bulky calcification noted at the commissure between the RCC/LCC. 2. Severe aortic stenosis (calcium score 6064). 3. Annular measurements appropriate for 29 mm S3 TAVR (615 mm2). 4. Trivial annular calcification. 5. Sufficient coronary to annulus distance. 6. Optimal Fluoroscopic Angle for Delivery: LAO 17 CAU 14 7. 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   1. Vascular findings and measurements pertinent to potential TAVR procedure, as detailed above. 2. Severe thickening and calcification of the aortic valve, compatible with reported clinical history of severe aortic stenosis. 3. 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 Urologic consultation is recommended for further clinical evaluation. 4. Aortic atherosclerosis, in addition to two vessel coronary artery disease. 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 5. Mild cardiomegaly. 6. Cholelithiasis without evidence of acute cholecystitis at this time. 7. Status post left radical nephrectomy. 8. Large ventral hernia containing multiple  loops of small bowel and a portion of the cecum. 9. Additional incidental findings, as above.   CT abd[facility]pelvis [facility] 07/10/2021  1. Persistent nodular soft tissue thickening poorly evaluated on this  noncontrast exam within the left extrarenal pelvis which is concerning for  malignancy. There is slightly increased stranding about the extrarenal  pelvis which can be seen in the setting of infection. Correlate with  urinalysis.  2. No evidence of distant metastatic disease.     Recent Labs: 01/23/2021: B Natriuretic Peptide 720.2 01/28/2021: Magnesium 1.8 02/03/2021: Hemoglobin 13.6; Platelets 153 10/07/2021: ALT 21; BUN 55; Creatinine, Ser 2.51; Potassium 4.8; Sodium 138; TSH 1.980  No results found for requested labs within last 365 days.   CrCl cannot be calculated (Patient's most recent lab result is older than the maximum 21 days allowed.).   Wt Readings from Last 3 Encounters:  10/07/21 204 lb 3.2 oz (92.6 kg)  07/22/21 206 lb 6.4 oz (93.6 kg)  03/25/21 204 lb (92.5 kg)     Other studies reviewed: Additional studies/records reviewed today include: summarized above  ASSESSMENT AND PLAN:  PPM Intact function, no programming changes made  Paroxysmal Afib CHA2DS2Vasc is 3, on Eliquis, appropriately dosed  24% %burden , though suspect higher, some episode declared over when still in Aflutter (2/2 blanking) Amiodarone  He is having acute/gross hematuria post XRT, as of yet, not advised by his MDs to stop the  Eliquis, but I elected not to try pace terminating his flutter incase he does have to come off his [place] temporarily  Increase his metoprolol to   '50mg'$  BID (ADDEND: to correct dose, 01/15/22, Tommye Standard, PA-C) Will have him back once her gets back in town, 2-3 weeks to reassess rhythm, hematuria, management strategy.  Continue amiodarone Recent labs Update CBC today  VHD S/p TAVR Jan 2023 Stable by last echo C/w Dr. Forde Radon  Disposition: F/u with Jonni Sanger or I in 2-3  weeks once back in town  Current medicines are reviewed at length with the patient today.  The patient did not have any concerns regarding medicines.  Venetia Night, PA-C 12/20/2021 9:43 AM     [place] [address] [phone] (office)  [phone] (fax)

## 2021-12-22 ENCOUNTER — Ambulatory Visit: Payer: Medicare PPO | Attending: Student | Admitting: Physician Assistant

## 2021-12-22 ENCOUNTER — Encounter: Payer: Self-pay | Admitting: Physician Assistant

## 2021-12-22 VITALS — BP 126/80 | HR 116 | Ht 73.0 in | Wt 206.0 lb

## 2021-12-22 DIAGNOSIS — I495 Sick sinus syndrome: Secondary | ICD-10-CM

## 2021-12-22 DIAGNOSIS — Z79899 Other long term (current) drug therapy: Secondary | ICD-10-CM | POA: Diagnosis not present

## 2021-12-22 DIAGNOSIS — Z950 Presence of cardiac pacemaker: Secondary | ICD-10-CM

## 2021-12-22 DIAGNOSIS — I480 Paroxysmal atrial fibrillation: Secondary | ICD-10-CM

## 2021-12-22 DIAGNOSIS — Z952 Presence of prosthetic heart valve: Secondary | ICD-10-CM | POA: Diagnosis not present

## 2021-12-22 DIAGNOSIS — I4892 Unspecified atrial flutter: Secondary | ICD-10-CM | POA: Diagnosis not present

## 2021-12-22 LAB — CBC
Hematocrit: 33.1 % — ABNORMAL LOW (ref 37.5–51.0)
Hemoglobin: 10.8 g/dL — ABNORMAL LOW (ref 13.0–17.7)
MCH: 29.3 pg (ref 26.6–33.0)
MCHC: 32.6 g/dL (ref 31.5–35.7)
MCV: 90 fL (ref 79–97)
Platelets: 109 10*3/uL — ABNORMAL LOW (ref 150–450)
RBC: 3.68 x10E6/uL — ABNORMAL LOW (ref 4.14–5.80)
RDW: 15.1 % (ref 11.6–15.4)
WBC: 6.4 10*3/uL (ref 3.4–10.8)

## 2021-12-22 LAB — CUP PACEART INCLINIC DEVICE CHECK
Date Time Interrogation Session: 20231128094543
Implantable Lead Connection Status: 753985
Implantable Lead Connection Status: 753985
Implantable Lead Implant Date: 20230123
Implantable Lead Implant Date: 20230123
Implantable Lead Location: 753859
Implantable Lead Location: 753860
Implantable Lead Model: 7841
Implantable Lead Model: 7842
Implantable Lead Serial Number: 1101059
Implantable Lead Serial Number: 1169597
Implantable Pulse Generator Implant Date: 20230123
Lead Channel Pacing Threshold Amplitude: 1 V
Lead Channel Pacing Threshold Pulse Width: 0.4 ms
Lead Channel Sensing Intrinsic Amplitude: 12.3 mV
Lead Channel Sensing Intrinsic Amplitude: 6.2 mV
Pulse Gen Serial Number: 561085

## 2021-12-22 MED ORDER — METOPROLOL SUCCINATE ER 25 MG PO TB24: 25.0000 mg | ORAL_TABLET | Freq: Every day | ORAL | 2 refills | Status: DC

## 2021-12-22 MED ORDER — METOPROLOL SUCCINATE ER 50 MG PO TB24: 50.0000 mg | ORAL_TABLET | Freq: Two times a day (BID) | ORAL | 3 refills | Status: DC

## 2021-12-22 NOTE — Addendum Note (Signed)
Addended by: Stanton Kidney on: 12/22/2021 11:09 AM   Modules accepted: Orders

## 2021-12-22 NOTE — Patient Instructions (Addendum)
Medication Instructions:  Your physician has recommended you make the following change in your medication:   INCREASE your Metoprolol (Toprol) to 50 mg twice daily   *If you need a refill on your cardiac medications before your next appointment, please call your pharmacy*   Lab Work: Today: CBC  If you have labs (blood work) drawn today and your tests are completely normal, you will receive your results only by: [place] (if you have MyChart) OR A paper copy in the mail If you have any lab test that is abnormal or we need to change your treatment, we will call you to review the results.   Testing/Procedures: None ordered   Follow-Up: At [facility], you and your health needs are our priority.  As part of our continuing mission to provide you with exceptional heart care, we have created designated Provider Care Teams.  These Care Teams include your primary Cardiologist (physician) and Advanced Practice Providers (APPs -  Physician Assistants and Nurse Practitioners) who all work together to provide you with the care you need, when you need it.  We recommend signing up for the patient portal called "MyChart".  Sign up information is provided on this After Visit Summary.  MyChart is used to connect with patients for Virtual Visits (Telemedicine).  Patients are able to view lab/test results, encounter notes, upcoming appointments, etc.  Non-urgent messages can be sent to your provider as well.   To learn more about what you can do with MyChart, go to [email or web address].    Your next appointment:   2 week(s)  The format for your next appointment:   In Person  Provider:   You will see one of the following Advanced Practice Providers on your designated Care Team:   Tommye Standard, [place] Legrand Como "Jonni Sanger" Chalmers Cater, [place]    Thank you for choosing [facility]!!   [phone]  Other Instructions   Important Information About [place]

## 2021-12-24 ENCOUNTER — Telehealth: Payer: Self-pay | Admitting: Physician Assistant

## 2021-12-24 DIAGNOSIS — I480 Paroxysmal atrial fibrillation: Secondary | ICD-10-CM | POA: Diagnosis not present

## 2021-12-24 DIAGNOSIS — J029 Acute pharyngitis, unspecified: Secondary | ICD-10-CM | POA: Diagnosis not present

## 2021-12-24 DIAGNOSIS — K219 Gastro-esophageal reflux disease without esophagitis: Secondary | ICD-10-CM | POA: Diagnosis not present

## 2021-12-24 DIAGNOSIS — N390 Urinary tract infection, site not specified: Secondary | ICD-10-CM | POA: Diagnosis not present

## 2021-12-24 DIAGNOSIS — R310 Gross hematuria: Secondary | ICD-10-CM | POA: Diagnosis not present

## 2021-12-24 DIAGNOSIS — R5383 Other fatigue: Secondary | ICD-10-CM | POA: Diagnosis not present

## 2021-12-24 DIAGNOSIS — R059 Cough, unspecified: Secondary | ICD-10-CM | POA: Diagnosis not present

## 2021-12-24 DIAGNOSIS — C649 Malignant neoplasm of unspecified kidney, except renal pelvis: Secondary | ICD-10-CM | POA: Diagnosis not present

## 2021-12-24 DIAGNOSIS — D649 Anemia, unspecified: Secondary | ICD-10-CM | POA: Diagnosis not present

## 2021-12-24 DIAGNOSIS — N184 Chronic kidney disease, stage 4 (severe): Secondary | ICD-10-CM | POA: Diagnosis not present

## 2021-12-24 NOTE — Telephone Encounter (Signed)
  Pt's daughter calling back regarding pt's lab result. They are concern since pt's hemoglobin is low it might affecting his blood since he is on blood thinner and she said pt woke up not felling well this morning. She would like to speak with a nurse again, she said as soon as possible so she can call pt's pcp as well

## 2021-12-24 NOTE — Telephone Encounter (Signed)
Called patient to see if okay to talk to his daughter, Claiborne Billings, since she is not on DPR list. Patient gave verbal consent to speak with his daughter. Informed her of patient's results of his labs and Tommye Standard PA's advisement as written below. She has already called the PCP's office and waiting for a call back. Informed her that she should call patient's oncologist as well. She also stated patient woke-up with sore throat, congestion, and just does not feel well today. They were suppose to leave today, but they are postponing trip for now.    Baldwin Jamaica, PA-C 12/22/2021  4:26 PM EST     Blood counts are down from his last labs, Hgb is 10.9 from 12.4 a few weeks ago.  Please ask him to follow up with his PMD/[facility] doctors to let them know and if they have any recommendations regarding his Eliquis, medicines. He is going out of town soon, so would like him to reach out to them ASAP so there is a plan for him.

## 2022-01-08 ENCOUNTER — Ambulatory Visit: Payer: Medicare PPO | Admitting: Cardiovascular Disease

## 2022-01-13 ENCOUNTER — Telehealth: Payer: Self-pay | Admitting: Cardiology

## 2022-01-13 NOTE — Telephone Encounter (Signed)
Called pt in regards to dose of Metoprolol Succinate. Pt reports does not feel that dose picked up from pharmacy is correct.   Reviewed pt Providers last office visit note from 12/22/21:   Paroxysmal Afib CHA2DS2Vasc is 3, on Eliquis, appropriately dosed  24% %burden , though suspect higher, some episode declared over when still in Aflutter (2/2 blanking) Amiodarone   He is having acute/gross hematuria post XRT, as of yet, not advised by his MDs to stop the  Eliquis, but I elected not to try pace terminating his flutter incase he does have to come off his [place] temporarily   Increase his metoprolol to '25mg'$  BID Will have him back once her gets back in town, 2-3 weeks to reassess rhythm, hematuria, management strategy.  Current medication list Metoprolol succinate 50 mg PO BID.  Will send to provider for clarification.

## 2022-01-13 NOTE — Progress Notes (Addendum)
Electrophysiology Office Note Date: 01/15/2022  ID:  Jordan Oconnell, Jordan Oconnell December 01, 1940, MRN [id number]  PCP: Burnard Bunting, MD Primary Cardiologist: Sanda Klein, MD Electrophysiologist: Vickie Epley, MD   CC: Pacemaker follow-up  Jordan Oconnell is a 81 y.o. male seen today for Vickie Epley, MD for routine electrophysiology followup. Since last being seen in our clinic the patient reports doing OK. He called Dr. Reynaldo Oconnell and his Eliquis has been stopped. His hematuria stopped just a few days after he was seen by [facility].   He has some confusion about what dose of Metoprolol he was taking. Labwork yesterday shows CBC stable ~10.4. Remains off eliquis.   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 Burt Knack and Dr. Cyndia Bent   Severe aortic stenosis 10/05/2020   Past Surgical History:  Procedure Laterality Date   APPENDECTOMY     BLADDER SURGERY  2008   transurethral resection/resection of prostatic urethra   BOWEL RESECTION     ESOPHAGOGASTRODUODENOSCOPY N/A 07/24/2013   Procedure: ESOPHAGOGASTRODUODENOSCOPY (EGD);  Surgeon: [name], MD;  Location: [name];  Service: Endoscopy;  Laterality: N/A;   INTRAOPERATIVE TRANSTHORACIC ECHOCARDIOGRAM N/A 01/27/2021   Procedure: INTRAOPERATIVE TRANSTHORACIC ECHOCARDIOGRAM;  Surgeon: Sherren Mocha, MD;  Location: [facility];  Service: Open Heart Surgery;  Laterality: N/A;   laparoscopic surgery  2012   laser   (?)   NEPHRECTOMY  2007   partial, left   NEPHROSTOMY  2011   stent   NM MYOCAR PERF WALL MOTION  09/08/2010   Normal   PACEMAKER IMPLANT N/A 02/12/2021   Procedure: PACEMAKER IMPLANT;  Surgeon: Vickie Epley, MD;  Location: [facility];  Service: Cardiovascular;  Laterality: N/A;   RIGHT HEART CATH AND CORONARY ANGIOGRAPHY N/A 10/06/2020   Procedure: RIGHT HEART CATH AND CORONARY ANGIOGRAPHY;  Surgeon: Sherren Mocha, MD;  Location: [place] [facility];  Service: Cardiovascular;  Laterality: N/A;   ROBOT ASSISTED LAPAROSCOPIC COMPLETE CYSTECT ILEAL CONDUIT     TRANSCATHETER AORTIC VALVE REPLACEMENT, TRANSFEMORAL Right 01/27/2021   Procedure: TRANSCATHETER AORTIC VALVE REPLACEMENT, TRANSFEMORAL;  Surgeon: Sherren Mocha, MD;  Location: [place] [facility];  Service: Open Heart Surgery;  Laterality: Right;   US ECHOCARDIOGRAPHY  5/68/1275   mild diastolic dysfunction,mild dilated LA,mild MR,AI,mildly dilated aortic root    Current Outpatient Medications  Medication Sig Dispense Refill   ALPRAZolam (XANAX) 0.25 MG tablet Take 0.25 mg by mouth at bedtime.     amiodarone (PACERONE) 200 MG tablet Take 1 tablet (200 mg total) by mouth 2 (two) times daily for 14 days, THEN 1 tablet (200 mg total) daily. 90 tablet 3   apixaban (ELIQUIS) 2.5 MG TABS tablet Take 1 tablet (2.5 mg total) by mouth 2 (two) times daily. 60 tablet 11   azithromycin (ZITHROMAX) 500  MG tablet Take 1 tablet by mouth 1 hour before dental procedures and cleanings 12 tablet 6   diltiazem (CARDIZEM CD) 180 MG 24 hr capsule Take 1 capsule by mouth twice daily 180 capsule 3   hydrALAZINE (APRESOLINE) 50 MG tablet Take 50 mg by mouth 2 (two) times daily.     latanoprost (XALATAN) 0.005 % ophthalmic solution Place 1 drop into both eyes at bedtime.     metoprolol succinate (TOPROL-XL) 50 MG 24 hr tablet Take 1 tablet (50 mg total) by  mouth in the morning and at bedtime. Take with or immediately following a meal. 180 tablet 3   mirtazapine (REMERON) 15 MG tablet Take 7.5 mg by mouth at bedtime.     Pembrolizumab (KEYTRUDA IV) Inject into the vein every 6 (six) weeks. Infusion     sodium bicarbonate 650 MG tablet Take 1,300 mg by mouth 2 (two) times daily.     No current facility-administered medications for this visit.    Allergies:   Amoxicillin, Doxycycline, and Flomax [tamsulosin hcl]   Social History: Social History   Socioeconomic History   Marital status: Divorced    Spouse name: Not on file   Number of children: 2   Years of education: Not on file   Highest education level: Not on file  Occupational History   Occupation: Pharmacist, hospital  Tobacco Use   Smoking status: Never   Smokeless tobacco: Never  Vaping Use   Vaping Use: Never used  Substance and Sexual Activity   Alcohol use: Yes    Comment: occasionally   Drug use: No   Sexual activity: Not on file  Other Topics Concern   Not on file  Social History Narrative   Not on file   Social Determinants of Health   Financial Resource Strain: Not on file  Food Insecurity: Not on file  Transportation Needs: Not on file  Physical Activity: Not on file  Stress: Not on file  Social Connections: Not on file  Intimate Partner Violence: Not on file    Family History: Family History  Problem Relation Age of Onset   Ovarian cancer Mother    Colon cancer Mother    Colon cancer Father    Crohn's disease Son    Breast cancer Daughter    Colon cancer Brother      Review of Systems: All other systems reviewed and are otherwise negative except as noted above.  Physical Exam: Vitals:   01/15/22 1210  BP: (!) 102/58  Pulse: 68  SpO2: 93%  Weight: 201 lb (91.2 kg)  Height: '6\' 1"'$  (1.854 m)     GEN- The patient is well appearing, alert and oriented x 3 today.   HEENT: normocephalic, atraumatic; sclera clear, conjunctiva pink; hearing intact;  oropharynx clear; neck supple, no JVP Lymph- no cervical lymphadenopathy Lungs- Clear to ausculation bilaterally, normal work of breathing.  No wheezes, rales, rhonchi Heart- Irregularly irregular  rate and rhythm, no murmurs, rubs or gallops, PMI not laterally displaced GI- soft, non-tender, non-distended, bowel sounds present, no hepatosplenomegaly Extremities- no clubbing or cyanosis. No peripheral edema; DP/PT/radial pulses 2+ bilaterally MS- no significant deformity or atrophy Skin- warm and dry, no rash or lesion; PPM pocket well healed Psych- euthymic mood, full affect Neuro- strength and sensation are intact  PPM Interrogation- Not checked fully today.   EKG:  EKG is not ordered today.  Recent Labs: 01/23/2021: B Natriuretic Peptide 720.2 01/28/2021: Magnesium 1.8 10/07/2021: ALT 21; BUN 55; Creatinine, Ser 2.51; Potassium  4.8; Sodium 138; TSH 1.980 12/22/2021: Hemoglobin 10.8; Platelets 109   Wt Readings from Last 3 Encounters:  01/15/22 201 lb (91.2 kg)  12/22/21 206 lb (93.4 kg)  10/07/21 204 lb 3.2 oz (92.6 kg)     Other studies Reviewed: Additional studies/ records that were reviewed today include: Previous EP office notes, Previous remote checks, Most recent labwork.   Assessment and Plan:  1. 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  No changes today  2. Paroxysmal AF / flutter Pt remains in AF today by brief check with irregularly irregular P-P intervals Burdens have been around 30% past several checks, with ? Accuracy due to p waves falling into blanking. He is off Eliquis. CHA2DS2VASc  of at least 3.  Stop amiodarone now that he is off Eliquis.  Change Toprol to 25 mg BID.  Will schedule earlier no charge remote to re-assess burden.  3. Hematuria 4. Ureteral  cancer Resolved shortly after last visit, Eliquis remains on hold.  Saw oncology yesterday, last RT treatment 12/04/2021 Has CT schedule 03/10/2022 to  evaluate kidney mass.  Very complicated case. We briefly discussed re-challenging with Eliquis vs continuing on just ASA alone, which he understands does not protect him from stroke. He is on the fence and would like a suggestion. I will discuss with his care team.   Current medicines are reviewed at length with the patient today.    Labs from 01/14/2022 reviewed.   Disposition:   Follow up with Dr. [name] in 6 months, Dr. Sallyanne Kuster as scheduled in February. Sooner, if decision made to re-challenge on [place].  Jacalyn Lefevre, PA-C  01/15/2022 12:16 PM  [place] [address] [phone] (office) [phone] (fax)

## 2022-01-13 NOTE — Telephone Encounter (Signed)
Pt c/o medication issue:  1. Name of Medication:   metoprolol succinate (TOPROL-XL) 50 MG 24 hr tablet    2. How are you currently taking this medication (dosage and times per day)?   Take 1 tablet (50 mg total) by mouth in the morning and at bedtime. Take with or immediately following a meal.    3. Are you having a reaction (difficulty breathing--STAT)? no  4. What is your medication issue? Patient thought changes was suppose to be made to this medication. Please advise

## 2022-01-14 DIAGNOSIS — C662 Malignant neoplasm of left ureter: Secondary | ICD-10-CM | POA: Diagnosis not present

## 2022-01-14 DIAGNOSIS — Z905 Acquired absence of kidney: Secondary | ICD-10-CM | POA: Diagnosis not present

## 2022-01-14 DIAGNOSIS — C661 Malignant neoplasm of right ureter: Secondary | ICD-10-CM | POA: Diagnosis not present

## 2022-01-14 DIAGNOSIS — C642 Malignant neoplasm of left kidney, except renal pelvis: Secondary | ICD-10-CM | POA: Diagnosis not present

## 2022-01-14 DIAGNOSIS — C678 Malignant neoplasm of overlapping sites of bladder: Secondary | ICD-10-CM | POA: Diagnosis not present

## 2022-01-14 DIAGNOSIS — I129 Hypertensive chronic kidney disease with stage 1 through stage 4 chronic kidney disease, or unspecified chronic kidney disease: Secondary | ICD-10-CM | POA: Diagnosis not present

## 2022-01-14 DIAGNOSIS — C652 Malignant neoplasm of left renal pelvis: Secondary | ICD-10-CM | POA: Diagnosis not present

## 2022-01-14 DIAGNOSIS — N184 Chronic kidney disease, stage 4 (severe): Secondary | ICD-10-CM | POA: Diagnosis not present

## 2022-01-14 DIAGNOSIS — Z5112 Encounter for antineoplastic immunotherapy: Secondary | ICD-10-CM | POA: Diagnosis not present

## 2022-01-14 DIAGNOSIS — Z923 Personal history of irradiation: Secondary | ICD-10-CM | POA: Diagnosis not present

## 2022-01-15 ENCOUNTER — Ambulatory Visit: Payer: Medicare PPO | Attending: Student | Admitting: Student

## 2022-01-15 ENCOUNTER — Encounter: Payer: Self-pay | Admitting: Student

## 2022-01-15 VITALS — BP 102/58 | HR 68 | Ht 73.0 in | Wt 201.0 lb

## 2022-01-15 DIAGNOSIS — I480 Paroxysmal atrial fibrillation: Secondary | ICD-10-CM | POA: Diagnosis not present

## 2022-01-15 DIAGNOSIS — I495 Sick sinus syndrome: Secondary | ICD-10-CM | POA: Diagnosis not present

## 2022-01-15 DIAGNOSIS — R319 Hematuria, unspecified: Secondary | ICD-10-CM | POA: Diagnosis not present

## 2022-01-15 MED ORDER — METOPROLOL SUCCINATE ER 50 MG PO TB24: 25.0000 mg | ORAL_TABLET | Freq: Two times a day (BID) | ORAL | 3 refills | Status: DC

## 2022-01-15 NOTE — Patient Instructions (Signed)
Medication Instructions:  Your physician has recommended you make the following change in your medication:   DISCONTINUE: Amiodarone DECREASE:  Metoprolol Succinate to '25mg'$  twice daily  *If you need a refill on your cardiac medications before your next appointment, please call your pharmacy*   Lab Work: None If you have labs (blood work) drawn today and your tests are completely normal, you will receive your results only by: [place] (if you have MyChart) OR A paper copy in the mail If you have any lab test that is abnormal or we need to change your treatment, we will call you to review the results.   Follow-Up: At [facility], you and your health needs are our priority.  As part of our continuing mission to provide you with exceptional heart care, we have created designated Provider Care Teams.  These Care Teams include your primary Cardiologist (physician) and Advanced Practice Providers (APPs -  Physician Assistants and Nurse Practitioners) who all work together to provide you with the care you need, when you need it.  We recommend signing up for the patient portal called "MyChart".  Sign up information is provided on this After Visit Summary.  MyChart is used to connect with patients for Virtual Visits (Telemedicine).  Patients are able to view lab/test results, encounter notes, upcoming appointments, etc.  Non-urgent messages can be sent to your provider as well.   To learn more about what you can do with MyChart, go to [email or web address].    Your next appointment:   6 month(s)  The format for your next appointment:   In Person  Provider:   Lars Mage, MD    Important Information About Sugar

## 2022-01-19 NOTE — Progress Notes (Signed)
Due to age and renal dysfunction, Eliquis 2.5 mg twice daily is the appropriate dose.  I would recommend re-challenging with the lower dose Eliquis. If bleeding occurs promptly, he is not a Watchman candidate. If he tolerates the Eliquis for several months, Watchman could be a strategy for him.  His urothelial cancer has been a smoldering issue for many years now.

## 2022-01-20 ENCOUNTER — Telehealth: Payer: Self-pay

## 2022-01-20 NOTE — Telephone Encounter (Signed)
The patient has been notified of the recommendation and verbalized understanding.  All questions (if any) were answered. Altamese Deguire, [facility] 01/20/2022 2:06 PM

## 2022-01-20 NOTE — Telephone Encounter (Signed)
Transmission received.  Patient initiated transmission, AFL ongoing from 12/26 Pt was seen in the office 12/22, Eliquis remains on hold, burden 55% Route for updated burden

## 2022-01-20 NOTE — Telephone Encounter (Signed)
Noted. Have received recommendation from his care team that he be re-challenged on Eliquis.

## 2022-01-20 NOTE — Telephone Encounter (Signed)
Pt agreed to take the Eliquis and will call with any concerns.

## 2022-01-20 NOTE — Telephone Encounter (Signed)
-----   Message from Shirley Friar, PA-C sent at 01/20/2022 12:10 PM EST ----- Please let the pt know that Dr. Sallyanne Kuster recommends rechallenging with Eliquis.  If bleeding recurs will need to stop, and at that point would not likely be candidate for resumptions.   If he tolerates for several months then could possible see Dr. [name] for Watchman consideration.    ----- Message ----- From: Sanda Klein, MD Sent: 01/19/2022   5:34 PM EST To: Shirley Friar, PA-C; #     ----- Message ----- From: Shirley Friar, Hershal Coria Sent: 01/15/2022  12:56 PM EST To: Sanda Klein, MD; Vickie Epley, MD  Very complicated case with Hematuria and Ureteral cancer on Eliquis for CHA2DS2VASc of at least 3. This has been on hold since end of November per PCP.   Discussed with patient that ASA will not protect him from stroke; and stopped amiodarone as he is off Eliquis and in persistent AF.  He has pending follow up with Oncology in February to evaluate if his kidney mass is progressing.         As bleeding was semi-isolated, reasonable to re-challenge on Eliquis 2.5 mg BID at least once? Or leave him off anticoagulation until oncology follow up And/Or urge him to get a stronger recommendation from them as to his overall risk of bleeding vs CVA?

## 2022-01-20 NOTE — Telephone Encounter (Signed)
Called pt to advise of Renee recommendation.  Pt reports was in the office with [place] on 01/15/22 and was told to take metoprolol succinate 25 mg PO BID.   Advised pt to follow instructions per [place].  Pt now worried if he should take 50 mg or 25 mg.  Advised to follow instructions of provider for most recent OV.  Pt is still requesting Renee's recommendation.  Will send for review.

## 2022-01-20 NOTE — Telephone Encounter (Signed)
Pt is calling back with concerns. He noticed on his MyChart that his Hbg 6 days ago was 10.4. He wants to make sure you all are aware of this before he starts back on the Eliquis.  He said he just wanted to double check.

## 2022-01-20 NOTE — Telephone Encounter (Signed)
Called pt advised of Renee recommendation:  Please advise as per Andy's last note, who saw him him clinic most recently.  THANKS!   Pt had no further questions or concerns.

## 2022-01-20 NOTE — Telephone Encounter (Signed)
Patient calling back. He says he has questions about his hemoglobin.

## 2022-01-21 NOTE — Telephone Encounter (Signed)
Patient calling back to speak with April again.

## 2022-01-21 NOTE — Telephone Encounter (Signed)
Pt wanted to know if he should continue taking Aspirin with his Eliquis.  Previously he was NOT taking them together. He was told to take an Aspirin when he stopped the Eliquis.  Since he was not taking them together previously, I advised the pt not to take them both now.

## 2022-01-27 DIAGNOSIS — Z936 Other artificial openings of urinary tract status: Secondary | ICD-10-CM | POA: Diagnosis not present

## 2022-01-27 DIAGNOSIS — Z9622 Myringotomy tube(s) status: Secondary | ICD-10-CM | POA: Diagnosis not present

## 2022-01-27 DIAGNOSIS — H6992 Unspecified Eustachian tube disorder, left ear: Secondary | ICD-10-CM | POA: Diagnosis not present

## 2022-01-28 DIAGNOSIS — C649 Malignant neoplasm of unspecified kidney, except renal pelvis: Secondary | ICD-10-CM | POA: Diagnosis not present

## 2022-01-28 DIAGNOSIS — I480 Paroxysmal atrial fibrillation: Secondary | ICD-10-CM | POA: Diagnosis not present

## 2022-01-28 DIAGNOSIS — E039 Hypothyroidism, unspecified: Secondary | ICD-10-CM | POA: Diagnosis not present

## 2022-01-28 DIAGNOSIS — D649 Anemia, unspecified: Secondary | ICD-10-CM | POA: Diagnosis not present

## 2022-01-28 DIAGNOSIS — N184 Chronic kidney disease, stage 4 (severe): Secondary | ICD-10-CM | POA: Diagnosis not present

## 2022-01-28 DIAGNOSIS — R5383 Other fatigue: Secondary | ICD-10-CM | POA: Diagnosis not present

## 2022-01-28 DIAGNOSIS — I959 Hypotension, unspecified: Secondary | ICD-10-CM | POA: Diagnosis not present

## 2022-02-05 DIAGNOSIS — R7989 Other specified abnormal findings of blood chemistry: Secondary | ICD-10-CM | POA: Diagnosis not present

## 2022-02-05 DIAGNOSIS — E785 Hyperlipidemia, unspecified: Secondary | ICD-10-CM | POA: Diagnosis not present

## 2022-02-05 DIAGNOSIS — I10 Essential (primary) hypertension: Secondary | ICD-10-CM | POA: Diagnosis not present

## 2022-02-05 DIAGNOSIS — D649 Anemia, unspecified: Secondary | ICD-10-CM | POA: Diagnosis not present

## 2022-02-05 DIAGNOSIS — R7301 Impaired fasting glucose: Secondary | ICD-10-CM | POA: Diagnosis not present

## 2022-02-05 DIAGNOSIS — R82998 Other abnormal findings in urine: Secondary | ICD-10-CM | POA: Diagnosis not present

## 2022-02-11 DIAGNOSIS — D3131 Benign neoplasm of right choroid: Secondary | ICD-10-CM | POA: Diagnosis not present

## 2022-02-11 DIAGNOSIS — H43813 Vitreous degeneration, bilateral: Secondary | ICD-10-CM | POA: Diagnosis not present

## 2022-02-11 DIAGNOSIS — H35033 Hypertensive retinopathy, bilateral: Secondary | ICD-10-CM | POA: Diagnosis not present

## 2022-02-11 DIAGNOSIS — H43393 Other vitreous opacities, bilateral: Secondary | ICD-10-CM | POA: Diagnosis not present

## 2022-02-11 DIAGNOSIS — H353211 Exudative age-related macular degeneration, right eye, with active choroidal neovascularization: Secondary | ICD-10-CM | POA: Diagnosis not present

## 2022-02-11 DIAGNOSIS — H353122 Nonexudative age-related macular degeneration, left eye, intermediate dry stage: Secondary | ICD-10-CM | POA: Diagnosis not present

## 2022-02-11 DIAGNOSIS — H31092 Other chorioretinal scars, left eye: Secondary | ICD-10-CM | POA: Diagnosis not present

## 2022-02-19 ENCOUNTER — Ambulatory Visit: Payer: Medicare PPO

## 2022-02-19 ENCOUNTER — Ambulatory Visit (HOSPITAL_COMMUNITY): Payer: Medicare PPO | Attending: Cardiovascular Disease

## 2022-02-19 DIAGNOSIS — R82998 Other abnormal findings in urine: Secondary | ICD-10-CM | POA: Diagnosis not present

## 2022-02-19 DIAGNOSIS — I10 Essential (primary) hypertension: Secondary | ICD-10-CM | POA: Diagnosis not present

## 2022-02-19 DIAGNOSIS — K219 Gastro-esophageal reflux disease without esophagitis: Secondary | ICD-10-CM | POA: Diagnosis not present

## 2022-02-19 DIAGNOSIS — Z952 Presence of prosthetic heart valve: Secondary | ICD-10-CM

## 2022-02-19 DIAGNOSIS — E785 Hyperlipidemia, unspecified: Secondary | ICD-10-CM | POA: Diagnosis not present

## 2022-02-19 DIAGNOSIS — Z125 Encounter for screening for malignant neoplasm of prostate: Secondary | ICD-10-CM | POA: Diagnosis not present

## 2022-02-19 DIAGNOSIS — R7301 Impaired fasting glucose: Secondary | ICD-10-CM | POA: Diagnosis not present

## 2022-02-19 LAB — ECHOCARDIOGRAM COMPLETE
AV Mean grad: 5.8 mmHg
AV Peak grad: 9.9 mmHg
Ao pk vel: 1.58 m/s
S' Lateral: 4.9 cm

## 2022-02-22 DIAGNOSIS — C649 Malignant neoplasm of unspecified kidney, except renal pelvis: Secondary | ICD-10-CM | POA: Diagnosis not present

## 2022-02-22 DIAGNOSIS — Z936 Other artificial openings of urinary tract status: Secondary | ICD-10-CM | POA: Diagnosis not present

## 2022-02-22 DIAGNOSIS — R7301 Impaired fasting glucose: Secondary | ICD-10-CM | POA: Diagnosis not present

## 2022-02-22 DIAGNOSIS — R5383 Other fatigue: Secondary | ICD-10-CM | POA: Diagnosis not present

## 2022-02-22 DIAGNOSIS — Z0000 Encounter for general adult medical examination without abnormal findings: Secondary | ICD-10-CM | POA: Diagnosis not present

## 2022-02-22 DIAGNOSIS — N184 Chronic kidney disease, stage 4 (severe): Secondary | ICD-10-CM | POA: Diagnosis not present

## 2022-02-22 DIAGNOSIS — D638 Anemia in other chronic diseases classified elsewhere: Secondary | ICD-10-CM | POA: Diagnosis not present

## 2022-02-22 DIAGNOSIS — Z1331 Encounter for screening for depression: Secondary | ICD-10-CM | POA: Diagnosis not present

## 2022-02-23 ENCOUNTER — Ambulatory Visit: Payer: Medicare PPO

## 2022-02-23 DIAGNOSIS — I495 Sick sinus syndrome: Secondary | ICD-10-CM

## 2022-02-24 LAB — CUP PACEART REMOTE DEVICE CHECK
Battery Remaining Longevity: 108 mo
Battery Remaining Percentage: 100 %
Brady Statistic RA Percent Paced: 0 %
Brady Statistic RV Percent Paced: 8 %
Date Time Interrogation Session: 20240131065200
Implantable Lead Connection Status: 753985
Implantable Lead Connection Status: 753985
Implantable Lead Implant Date: 20230123
Implantable Lead Implant Date: 20230123
Implantable Lead Location: 753859
Implantable Lead Location: 753860
Implantable Lead Model: 7841
Implantable Lead Model: 7842
Implantable Lead Serial Number: 1101059
Implantable Lead Serial Number: 1169597
Implantable Pulse Generator Implant Date: 20230123
Lead Channel Impedance Value: 625 Ohm
Lead Channel Impedance Value: 720 Ohm
Lead Channel Pacing Threshold Amplitude: 1.2 V
Lead Channel Pacing Threshold Pulse Width: 0.4 ms
Lead Channel Setting Pacing Amplitude: 2.5 V
Lead Channel Setting Pacing Amplitude: 3.5 V
Lead Channel Setting Pacing Pulse Width: 0.4 ms
Lead Channel Setting Sensing Sensitivity: 2.5 mV
Pulse Gen Serial Number: 561085
Zone Setting Status: 755011

## 2022-02-25 DIAGNOSIS — Z79899 Other long term (current) drug therapy: Secondary | ICD-10-CM | POA: Diagnosis not present

## 2022-02-25 DIAGNOSIS — Z803 Family history of malignant neoplasm of breast: Secondary | ICD-10-CM | POA: Diagnosis not present

## 2022-02-25 DIAGNOSIS — Z800 Family history of malignant neoplasm of digestive organs: Secondary | ICD-10-CM | POA: Diagnosis not present

## 2022-02-25 DIAGNOSIS — Z5112 Encounter for antineoplastic immunotherapy: Secondary | ICD-10-CM | POA: Diagnosis not present

## 2022-02-25 DIAGNOSIS — Z8041 Family history of malignant neoplasm of ovary: Secondary | ICD-10-CM | POA: Diagnosis not present

## 2022-02-25 DIAGNOSIS — C642 Malignant neoplasm of left kidney, except renal pelvis: Secondary | ICD-10-CM | POA: Diagnosis not present

## 2022-02-25 DIAGNOSIS — C662 Malignant neoplasm of left ureter: Secondary | ICD-10-CM | POA: Diagnosis not present

## 2022-02-25 DIAGNOSIS — C678 Malignant neoplasm of overlapping sites of bladder: Secondary | ICD-10-CM | POA: Diagnosis not present

## 2022-02-25 DIAGNOSIS — Z85528 Personal history of other malignant neoplasm of kidney: Secondary | ICD-10-CM | POA: Diagnosis not present

## 2022-02-25 DIAGNOSIS — C661 Malignant neoplasm of right ureter: Secondary | ICD-10-CM | POA: Diagnosis not present

## 2022-03-01 ENCOUNTER — Ambulatory Visit: Payer: Medicare PPO | Attending: Cardiovascular Disease | Admitting: Cardiovascular Disease

## 2022-03-01 ENCOUNTER — Other Ambulatory Visit: Payer: Self-pay

## 2022-03-01 ENCOUNTER — Encounter: Payer: Self-pay | Admitting: Cardiovascular Disease

## 2022-03-01 ENCOUNTER — Telehealth: Payer: Self-pay | Admitting: Cardiovascular Disease

## 2022-03-01 VITALS — BP 108/72 | HR 62 | Ht 73.0 in | Wt 188.0 lb

## 2022-03-01 DIAGNOSIS — I4892 Unspecified atrial flutter: Secondary | ICD-10-CM | POA: Diagnosis not present

## 2022-03-01 DIAGNOSIS — I484 Atypical atrial flutter: Secondary | ICD-10-CM | POA: Diagnosis not present

## 2022-03-01 DIAGNOSIS — I5021 Acute systolic (congestive) heart failure: Secondary | ICD-10-CM

## 2022-03-01 DIAGNOSIS — Z952 Presence of prosthetic heart valve: Secondary | ICD-10-CM

## 2022-03-01 DIAGNOSIS — I447 Left bundle-branch block, unspecified: Secondary | ICD-10-CM

## 2022-03-01 DIAGNOSIS — I480 Paroxysmal atrial fibrillation: Secondary | ICD-10-CM

## 2022-03-01 DIAGNOSIS — I428 Other cardiomyopathies: Secondary | ICD-10-CM

## 2022-03-01 DIAGNOSIS — I10 Essential (primary) hypertension: Secondary | ICD-10-CM | POA: Diagnosis not present

## 2022-03-01 DIAGNOSIS — R000 Tachycardia, unspecified: Secondary | ICD-10-CM | POA: Diagnosis not present

## 2022-03-01 DIAGNOSIS — Z936 Other artificial openings of urinary tract status: Secondary | ICD-10-CM | POA: Diagnosis not present

## 2022-03-01 DIAGNOSIS — D6869 Other thrombophilia: Secondary | ICD-10-CM

## 2022-03-01 DIAGNOSIS — I7121 Aneurysm of the ascending aorta, without rupture: Secondary | ICD-10-CM

## 2022-03-01 DIAGNOSIS — N184 Chronic kidney disease, stage 4 (severe): Secondary | ICD-10-CM

## 2022-03-01 DIAGNOSIS — I43 Cardiomyopathy in diseases classified elsewhere: Secondary | ICD-10-CM

## 2022-03-01 MED ORDER — METOPROLOL SUCCINATE ER 50 MG PO TB24: 50.0000 mg | ORAL_TABLET | Freq: Two times a day (BID) | ORAL | 3 refills | Status: DC

## 2022-03-01 MED ORDER — AMIODARONE HCL 400 MG PO TABS: 400.0000 mg | ORAL_TABLET | Freq: Every day | ORAL | 3 refills | Status: DC

## 2022-03-01 NOTE — Telephone Encounter (Signed)
Pt calling in to confirm that he is to stop taking the hydralizine and only take the medications currently on his list. He states he remembers going over it with you and I told him I see it was discontinued today but he just wants to be sure because his paperwork still had it on there.

## 2022-03-01 NOTE — Patient Instructions (Signed)
Medication Instructions:  Stop Diltiazem Metoprolol Tartrate '50mg'$  (1 tablet) twice a day Amiodarone '400mg'$  Daily  *If you need a refill on your cardiac medications before your next appointment, please call your pharmacy*   Lab Work: CBC, CMET, TSH If you have labs (blood work) drawn today and your tests are completely normal, you will receive your results only by: [place] (if you have MyChart) OR A paper copy in the mail If you have any lab test that is abnormal or we need to change your treatment, we will call you to review the results.   Testing/Procedures: 03/03/22 at 12:30 Your physician has recommended that you have a Cardioversion (DCCV). Electrical Cardioversion uses a jolt of electricity to your heart either through paddles or wired patches attached to your chest. This is a controlled, usually prescheduled, procedure. Defibrillation is done under light anesthesia in the hospital, and you usually go home the day of the procedure. This is done to get your heart back into a normal rhythm. You are not awake for the procedure. Please see the instruction sheet given to you today.       Dear Birdena Crandall Malachy Chamber  You are scheduled for a Cardioversion on Tuesday, February 7 with Dr. Harrell Gave.  Please arrive at the [facility] (Main [facility]trance [facility] at [facility]: [address] [place], [place] [address] at 11:30 AM.   DIET:  Nothing to eat or drink after midnight except a sip of water with medications (see medication instructions below)  MEDICATION INSTRUCTIONS: Take medications as prescribed       :1}Continue taking your anticoagulant (blood thinner): Apixaban (Eliquis).  You will need to continue this after your procedure until you are told by your provider that it is safe to stop.    LABS: Today    FYI:  For your safety, and to allow [place] to monitor your vital signs accurately during the surgery/procedure we request: If you have artificial nails, gel coating, SNS etc,  please have those removed prior to your surgery/procedure. Not having the nail coverings /polish removed may result in cancellation or delay of your surgery/procedure.  You must have a responsible person to drive you home and stay in the waiting area during your procedure. Failure to do so could result in cancellation.  Bring your insurance cards.  *Special Note: Every effort is made to have your procedure done on time. Occasionally there are emergencies that occur at the hospital that may cause delays. Please be patient if a delay does occur.   Follow-Up: At [facility], you and your health needs are our priority.  As part of our continuing mission to provide you with exceptional heart care, we have created designated Provider Care Teams.  These Care Teams include your primary Cardiologist (physician) and Advanced Practice Providers (APPs -  Physician Assistants and Nurse Practitioners) who all work together to provide you with the care you need, when you need it.  We recommend signing up for the patient portal called "MyChart".  Sign up information is provided on this After Visit Summary.  MyChart is used to connect with patients for Virtual Visits (Telemedicine).  Patients are able to view lab/test results, encounter notes, upcoming appointments, etc.  Non-urgent messages can be sent to your provider as well.   To learn more about what you can do with MyChart, go to [email or web address].    Your next appointment:   1 month(s) (can be a pacemaker day or Afib clinic)  Provider:  Sanda Klein, MD

## 2022-03-01 NOTE — Telephone Encounter (Signed)
Patient stated the AVS from today showed hydralazine still on his med list. He is not taking the medication. Verbal order from Dr. Sallyanne Kuster that patient is not taking the medication and to remove it from his med list. Patient advised.

## 2022-03-01 NOTE — Progress Notes (Signed)
Cardiology Office Note    Date:  03/01/2022   ID:  Jordan Oconnell, Jordan Oconnell, MRN [id number]  PCP:  Burnard Bunting, MD  Cardiologist:   Sanda Klein, MD   No chief complaint on file.   History of Present Illness:  Jordan Oconnell is a 82 y.o. male with AS s/p TAVR (Edwards S3U 29 mm, 01/25/2021), tachycardia- bradycardia syndrome s/p dual chamber pacemaker (BSC Accolade with His bundle lead, 02/12/2021, [name]), paroxysmal atrial fibrillation, moderate aortic root aneurysm (46 mm by CT October 2022) and multiple sites of urothelial cancer involving his bladder, ureters and renal pelvises bilaterally as well as renal cell carcinoma (history of partial left nephrectomy and complete cystectomy with urostomy), chronic kidney disease stage 4 with a baseline creatinine around 2.7-3.0.  Following pacemaker implantation it became apparent that he actually has a very high burden of atrial fibrillation at around 30%.   He was doing pretty well he was on vacation in [place] in the late fall, but he has been feeling very poorly for the last month.  He feels he is completely lost his stamina.  He does not have orthopnea, PND or lower extremity edema, but has progressed NYHA functional class III dyspnea.  He has not had palpitations, dizziness or syncope.  He denies focal neurological events.  He restarted taking Eliquis anticoagulation on January 4 and has not had any recurrence of hematuria or other bleeding problems or any falls.  He underwent a repeat echocardiogram last week that shows striking reduction left ventricular systolic function with an ejection fraction that is down to about 25%.  There is striking dyssynchrony between the septum and lateral wall.  The aortic valve prosthesis parameters remain normal.  During the echo he was in atrial flutter or atrial fibrillation with a ventricular rate of about 100 bpm.  EKG performed 12/14/2021 shows atrial flutter with 2: 1 AV block,  ventricular rate 115 bpm and a very broad QRS at 160 ms.  On his previous cell pacemaker download he only had 7% ventricular pacing and the burden of atrial fibrillation was around 30%.  The current download in the office today shows that the burden of atrial fibrillation has been 71% (essentially the last 48 days) and his ventricular rate has been in the 100-120 bpm range for the most part.  Presenting rhythm was atrial flutter with a cycle length of 233 ms and 2: 1 AV block resulting in a ventricular rate of 120 bpm.  Attempted overdrive pacing in the office today starting at a cycle length of 2 and 20 ms and gradual decrement down to 180 ms.  The arrhythmia did not terminate but converted to a faster atrial flutter/atrial fibrillation with a fairly stable cycle length of 180 ms, paradoxically resulting in a slower ventricular rate at 80-100 bpm.  Coronary angiography performed before his TAVR in September 2022 showed a 90% stenosis in the first diagonal artery, otherwise only moderate scattered stenoses.  He has a moderate aortic root aneurysm measured at 46 mm in maximum diameter.  Urologist, Dr. [name]. He has extensive (>50%) urothelial carcinoma in the solitary right kidney, but appears to have no progression of disease while on pembrolizumab and no metastatic disease.   Followed at [facility] by his urologist, Dr. [name] - now Dr. [name], most recent evaluation in June 2022 with no evidence of cancer recurrence by imaging studies since his left nephrectomy in June 2018.  He has a right lower quadrant ileal conduit and  urostomy.  His creatinine has been stable at 2.8-3.0 over the last couple of years, slightly better at 2.5 at his last visit with Dr. [name] in June 2022.  He has chronic hyperchloremic metabolic acidosis treated with sodium bicarbonate tablets.  His nephrologist is Dr. [name], but as far as I can tell his last visit with him was in June 2019.  His oncologist is Dr.  [name].     Past Medical History:  Diagnosis Date   Bladder cancer ([place])    BPH (benign prostatic hyperplasia)    CKD (chronic kidney disease), stage IV (HCC)    Colon polyps    Diverticulosis    GERD (gastroesophageal reflux disease)    Hyperlipidemia    pt denies   Hypertension    PAF (paroxysmal atrial fibrillation) ([place])    on Flecanide and diltiazem. No OAC given recurrent hematuria   Renal cell carcinoma ([place]) 2008   left   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 Burt Knack and Dr. Cyndia Bent   Severe aortic stenosis 10/05/2020    Past Surgical History:  Procedure Laterality Date   APPENDECTOMY     BLADDER SURGERY  2008   transurethral resection/resection of prostatic urethra   BOWEL RESECTION     ESOPHAGOGASTRODUODENOSCOPY N/A 07/24/2013   Procedure: ESOPHAGOGASTRODUODENOSCOPY (EGD);  Surgeon: [name], MD;  Location: [name];  Service: Endoscopy;  Laterality: N/A;   INTRAOPERATIVE TRANSTHORACIC ECHOCARDIOGRAM N/A 01/27/2021   Procedure: INTRAOPERATIVE TRANSTHORACIC ECHOCARDIOGRAM;  Surgeon: Sherren Mocha, MD;  Location: [place] [facility];  Service: Open Heart Surgery;  Laterality: N/A;   laparoscopic surgery  2012   laser   (?)   NEPHRECTOMY  2007   partial, left   NEPHROSTOMY  2011   stent   NM MYOCAR PERF WALL MOTION  09/08/2010   Normal   PACEMAKER IMPLANT N/A 02/12/2021   Procedure: PACEMAKER IMPLANT;  Surgeon: Vickie Epley, MD;  Location: [place] [facility];  Service: Cardiovascular;  Laterality: N/A;   RIGHT HEART CATH AND CORONARY ANGIOGRAPHY N/A 10/06/2020   Procedure: RIGHT HEART CATH AND CORONARY ANGIOGRAPHY;  Surgeon: Sherren Mocha, MD;  Location: [place] [facility];  Service: Cardiovascular;  Laterality: N/A;   ROBOT ASSISTED LAPAROSCOPIC COMPLETE CYSTECT ILEAL CONDUIT     TRANSCATHETER AORTIC VALVE REPLACEMENT, TRANSFEMORAL Right 01/27/2021   Procedure:  TRANSCATHETER AORTIC VALVE REPLACEMENT, TRANSFEMORAL;  Surgeon: Sherren Mocha, MD;  Location: [place] [facility];  Service: Open Heart Surgery;  Laterality: Right;   US ECHOCARDIOGRAPHY  7/56/4332   mild diastolic dysfunction,mild dilated LA,mild MR,AI,mildly dilated aortic root    Current Medications: Outpatient Medications Prior to Visit  Medication Sig Dispense Refill   ALPRAZolam (XANAX) 0.25 MG tablet Take 0.25 mg by mouth at bedtime.     azithromycin (ZITHROMAX) 500 MG tablet Take 1 tablet by mouth 1 hour before dental procedures and cleanings 12 tablet 6   diltiazem (CARDIZEM CD) 180 MG 24 hr capsule Take 1 capsule by mouth twice daily 180 capsule 3   ELIQUIS 2.5 MG TABS tablet Take 2.5 mg by mouth 2 (two) times daily.     latanoprost (XALATAN) 0.005 % ophthalmic solution Place 1 drop into both eyes at bedtime.     levothyroxine (SYNTHROID) 50 MCG tablet Take 50 mcg by mouth.     metoprolol succinate (TOPROL-XL) 50 MG 24 hr tablet Take 0.5 tablets (25 mg total) by mouth  in the morning and at bedtime. Take with or immediately following a meal. 90 tablet 3   mirtazapine (REMERON) 15 MG tablet Take 7.5 mg by mouth at bedtime.     Pembrolizumab (KEYTRUDA IV) Inject into the vein every 6 (six) weeks. Infusion     sodium bicarbonate 650 MG tablet Take 1,300 mg by mouth 2 (two) times daily.     hydrALAZINE (APRESOLINE) 50 MG tablet Take 50 mg by mouth 2 (two) times daily. (Patient not taking: Reported on 03/01/2022)     No facility-administered medications prior to visit.     Allergies:   Amoxicillin, Doxycycline, and Flomax [tamsulosin hcl]   Social History   Socioeconomic History   Marital status: Divorced    Spouse name: Not on file   Number of children: 2   Years of education: Not on file   Highest education level: Not on file  Occupational History   Occupation: Pharmacist, hospital  Tobacco Use   Smoking status: Never   Smokeless tobacco: Never  Vaping Use   Vaping Use: Never used   Substance and Sexual Activity   Alcohol use: Yes    Comment: occasionally   Drug use: No   Sexual activity: Not on file  Other Topics Concern   Not on file  Social History Narrative   Not on file   Social Determinants of Health   Financial Resource Strain: Not on file  Food Insecurity: Not on file  Transportation Needs: Not on file  Physical Activity: Not on file  Stress: Not on file  Social Connections: Not on file     Family History:  The patient's family history includes Breast cancer in his daughter; Colon cancer in his brother, father, and mother; Crohn's disease in his son; Ovarian cancer in his mother.   ROS:   Please see the history of present illness.     All other systems are reviewed and are negative.  PHYSICAL EXAM:   VS:  BP 108/72 (BP Location: Left Arm, Patient Position: Sitting, Cuff Size: Normal)   Pulse 62   Ht '6\' 1"'$  (1.854 m)   Wt 188 lb (85.3 kg)   SpO2 96%   BMI 24.80 kg/m      General: Alert, oriented x3, no distress, appears weaker than I remember him.  Healthy left subclavian pacemaker site Head: no evidence of trauma, PERRL, EOMI, no exophtalmos or lid lag, no myxedema, no xanthelasma; normal ears, nose and oropharynx Neck: normal jugular venous pulsations and no hepatojugular reflux; brisk carotid pulses without delay and no carotid bruits Chest: clear to auscultation, no signs of consolidation by percussion or palpation, normal fremitus, symmetrical and full respiratory excursions Cardiovascular: normal position and quality of the apical impulse, rapid regular rhythm, normal first and second heart sounds, grade 1/6 early peaking aortic ejection murmur.  No diastolic murmurs, rubs or gallops Abdomen: no tenderness or distention, no masses by palpation, no abnormal pulsatility or arterial bruits, normal bowel sounds, no hepatosplenomegaly Extremities: no clubbing, cyanosis or edema; 2+ radial, ulnar and brachial pulses bilaterally; 2+ right  femoral, posterior tibial and dorsalis pedis pulses; 2+ left femoral, posterior tibial and dorsalis pedis pulses; no subclavian or femoral bruits Neurological: grossly nonfocal Psych: Normal mood and affect      Wt Readings from Last 3 Encounters:  03/01/22 188 lb (85.3 kg)  01/15/22 201 lb (91.2 kg)  12/22/21 206 lb (93.4 kg)      Studies/Labs Reviewed:   EKG:  EKG is not ordered today.  ECG from 12/14/2021 is reviewed and shows atrial flutter with 2: 1 AV block and a very broad native QRS at 164 ms.  The intracardiac electrogram shows atrial flutter with a cycle length of 233 ms and 2: 1 AV block.   Holter tracing from May 2023 showed LVH and left anterior fascicular block with a QRS duration of only 100 ms.  Echocardiogram 02/19/2022   1. Systolic dysfunction is new compared with the echo 01/2021.  2. Left ventricular ejection fraction, by estimation, is 20 to 25%. The  left ventricle has severely decreased function. The left ventricle  demonstrates regional wall motion abnormalities (see scoring  diagram/findings for description). There is mild  concentric left ventricular hypertrophy. Left ventricular diastolic  function could not be evaluated.   3. Right ventricular systolic function is mildly reduced. The right  ventricular size is normal. There is normal pulmonary artery systolic  pressure.   4. Left atrial size was severely dilated.   5. The mitral valve is normal in structure. Mild mitral valve  regurgitation. No evidence of mitral stenosis.   6. The aortic valve has been repaired/replaced. Aortic valve  regurgitation is not visualized. No aortic stenosis is present. There is a  29 mm Sapien prosthetic (TAVR) valve present in the aortic position.  Aortic valve mean gradient measures 5.8 mmHg.  Aortic valve Vmax measures 1.58 m/s.   7. Aortic dilatation noted. There is moderate dilatation of the ascending  aorta, measuring 43 mm.   8. The inferior vena cava is  normal in size with greater than 50%  respiratory variability, suggesting right atrial pressure of 3 mmHg.   AV Vmax:           157.60 cm/s  AV Peak Grad:      9.9 mmHg  AV Mean Grad:      5.8 mmHg  LVOT/AV VTI ratio: 0.30      Cardiac catheterization September 2022   Ramus lesion is 60% stenosed.   1st Diag lesion is 90% stenosed.   2nd Diag lesion is 50% stenosed.   Prox LAD to Mid LAD lesion is 30% stenosed.   Mid Cx lesion is 30% stenosed.   1.  Nonobstructive coronary artery disease with separate LAD and left circumflex ostia, patent LAD with mild nonobstructive plaquing, severe ostial diagonal stenoses, patent left circumflex with moderate intermediate branch stenosis and no other significant stenoses, patent nondominant RCA 2.  Known severe aortic stenosis with heavy calcification and restriction of the aortic valve leaflets seen on plain fluoroscopy 3.  Normal right heart hemodynamics with preserved cardiac output, normal pulmonary pressures, and normal LVEDP  CT chest/aorta October 2022 1. Tricuspid aortic valve with acquired fusion of the RCC/LCC. Bulky calcification noted at the commissure between the RCC/LCC. 2. Severe aortic stenosis (calcium score 6064). 3. Annular measurements appropriate for 29 mm S3 TAVR (615 mm2). 4. Trivial annular calcification. 5. Sufficient coronary to annulus distance. 6. Optimal Fluoroscopic Angle for Delivery: LAO 17 CAU 14 7. 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   1. Vascular findings and measurements pertinent to potential TAVR procedure, as detailed above. 2. Severe thickening and calcification of the aortic valve, compatible with reported clinical history of severe aortic stenosis. 3. 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 Urologic consultation is  recommended for further clinical evaluation. 4. Aortic atherosclerosis,  in addition to two vessel coronary artery disease. 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 5. Mild cardiomegaly. 6. Cholelithiasis without evidence of acute cholecystitis at this time. 7. Status post left radical nephrectomy. 8. Large ventral hernia containing multiple loops of small bowel and a portion of the cecum. 9. Additional incidental findings, as above.  CT [facility]pelvis [facility] 07/10/2021  1. Persistent nodular soft tissue thickening poorly evaluated on this  noncontrast exam within the left extrarenal pelvis which is concerning for  malignancy. There is slightly increased stranding about the extrarenal  pelvis which can be seen in the setting of infection. Correlate with  urinalysis.  2. No evidence of distant metastatic disease.    Recent Labs: January 27, 2018 creatinine 2.9, Hemoglobin 14.2 Total cholesterol 153, HDL 38, LDL 94, triglycerides 103  02/12/2020 Cholesterol 157, HDL 38, LDL 102, triglycerides 84 Hemoglobin A1c 5.5%, potassium 4.7, normal liver function tests, TSH 0.840  07/09/2020 ([facility]) Sodium 139, testing 4.4, chloride 113, CO2 19, creatinine 2.5, BUN 54, normal liver function tests, hemoglobin 15.1     Latest Ref Rng & Units 10/07/2021    3:42 PM 02/03/2021    2:46 PM 01/29/2021    4:29 AM  BMP  Glucose 70 - 99 mg/dL 93  100  102   BUN 8 - 27 mg/dL 55  61  40   Creatinine 0.76 - 1.27 mg/dL 2.51  2.57  2.27   BUN/Creat Ratio 10 - '24 22  24    '$ Sodium 134 - 144 mmol/L 138  138  136   Potassium 3.5 - 5.2 mmol/L 4.8  5.0  4.2   Chloride 96 - 106 mmol/L 106  106  110   CO2 20 - 29 mmol/L '17  16  18   '$ Calcium 8.6 - 10.2 mg/dL 9.6  9.7  8.5    01 10 2023 Hemoglobin 13.6  Lipid Panel  No results found for: "CHOL", "TRIG", "HDL", "CHOLHDL", "VLDL", "LDLCALC", "LDLDIRECT", "LABVLDL"   01 16  2023 Cholesterol 144, HDL 35, LDL 93, triglycerides 78 Hemoglobin A1c 5.2% Potassium 5.1, ALT 20, TSH 1.73   Additional studies/ records that were reviewed today include:  Notes from follow-up visit with Dr. [name], Rob Hickman from June 2022   ASSESSMENT:    No diagnosis found.     PLAN:  In order of problems listed above:  CHF: Despite his length the problems of the arrhythmia and aortic stenosis is the first time he had overt heart failure and definitely first time that he has left ventricular systolic dysfunction.  It appears that this could be largely due to tachycardia cardiomyopathy.  He is having very high burden atrial flutter over the last 6 to 7 weeks.  The average ventricular rate seems to be 100-120 bpm.  In addition, he has profound ventricular dyssynchrony due to very broad intraventricular conduction delay, currently at well over 160 ms.  Guideline directed medical therapy for heart failure severely limited by his advanced chronic kidney disease and I do not think it would be beneficial to start either Entresto or SGLT2 inhibitors at this point.  He needs better ventricular rate control and ideally return to normal rhythm.  Surprisingly, his weight is actually down 13 pounds since his last office visit in November 2023.  He does not appear overtly hypervolemic and I did not prescribe diuretics today. AFlutter: Suspect that this is a focal possibly left  atrial flutter, rather than typical counterclockwise right atrial flutter.  Unsuccessful attempt at overdrive pacing actually accelerated the rhythm to a faster cycle length of 180 ms, but paradoxically this is now associated better ventricular rate control in the 80-100 bpm range.  Ideally will return to normal rhythm but he has a very high risk of arrhythmia recurrence.  No longer on flecainide following cath which showed evidence of CAD, TAVR and pacemaker implantation.  Not a candidate for Tikosyn due to CKD.   Started on amiodarone  400 mg daily and scheduled for cardioversion February 7 at 12:30 PM with Dr. Buford Dresser. IVCD: He may also benefit from better resynchronization.  I do not think that his wide QRS is simply rate related, since it has been present intermittently before even at slower heart rates.  He does have a His bundle lead that could provide resynchronization and would benefit from committed ventricular pacing.  I decreased his AV delay to 180 ms today, but this will make a difference until he is back in normal rhythm.  Will ask the [facility] [employer] rep to try to optimize device settings to provide committed ventricular pacing after his cardioversion on Wednesday, to see if the paced QRS is narrower than the native QRS. AS s/p TAVR: Stable gradients. Reinforced the need for endocarditis prophylaxis. Anticoagulation: CHA2DS2-VASc score 3 for age and hypertension, now increased to 4 due to development of heart failure.  Eliquis dose is adjusted for age and renal dysfunction.  He has had recurrent problems with hematuria.  He has been back on Eliquis 2.5 mg twice daily without bleeding events since 01/28/2022.  Consider Watchman device implantation if he can tolerate anticoagulation without bleeding for at least another 6 months.   HTN: Adequate control CKD4: Prevents use of usual guideline directed medical therapy for heart failure.  Stable following iodinated contrast based cath and CTA last fall and TAVR this January. He does have chronic metabolic acidosis and takes sodium bicarbonate supplements. Asc Ao Aneurysm: not a candidate for elective surgery. Widespread urothelial cancer , his new Urologist at [facility] is Dr. [name].  Good report at his follow-up MRI in June 2022 , but CT preTAVR raised concern for recurrent disease in right renal pelvis, no progression seen on CT without contrast in June 2023.  Note 04/13/2021 Right antegrade nephroscopy: - Diffuse (>50%) disease of the right renal pelvis: referred to [facility]  med onc to consider pembrolizumab - Not resected or ablated. Papillary urothelial carcinoma, high grade, non-invasive.  Muscularis propria is not represented.    Shared Decision Making/Informed Consent The risks (stroke, cardiac arrhythmias rarely resulting in the need for a temporary or permanent pacemaker, skin irritation or burns and complications associated with conscious sedation including aspiration, arrhythmia, respiratory failure and death), benefits (restoration of normal sinus rhythm) and alternatives of a direct current cardioversion were explained in detail to Mr. Nuno and he agrees to proceed.     Medication Adjustments/Labs and Tests Ordered: Current medicines are reviewed at length with the patient today.  Concerns regarding medicines are outlined above.  Medication changes, Labs and Tests ordered today are listed in the Patient Instructions below. There are no Patient Instructions on file for this visit.   Signed, Sanda Klein, MD  03/01/2022 8:24 AM    [facility] [place], [place], [place]  [address] Phone: [phone]; Fax: [phone]

## 2022-03-02 ENCOUNTER — Other Ambulatory Visit: Payer: Self-pay

## 2022-03-02 DIAGNOSIS — E611 Iron deficiency: Secondary | ICD-10-CM

## 2022-03-02 LAB — CBC
Hematocrit: 32.2 % — ABNORMAL LOW (ref 37.5–51.0)
Hemoglobin: 9.5 g/dL — ABNORMAL LOW (ref 13.0–17.7)
MCH: 24.7 pg — ABNORMAL LOW (ref 26.6–33.0)
MCHC: 29.5 g/dL — ABNORMAL LOW (ref 31.5–35.7)
MCV: 84 fL (ref 79–97)
Platelets: 119 10*3/uL — ABNORMAL LOW (ref 150–450)
RBC: 3.84 x10E6/uL — ABNORMAL LOW (ref 4.14–5.80)
RDW: 16.5 % — ABNORMAL HIGH (ref 11.6–15.4)
WBC: 5.8 10*3/uL (ref 3.4–10.8)

## 2022-03-02 LAB — COMPREHENSIVE METABOLIC PANEL
ALT: 11 IU/L (ref 0–44)
AST: 12 IU/L (ref 0–40)
Albumin/Globulin Ratio: 1.7 (ref 1.2–2.2)
Albumin: 4 g/dL (ref 3.7–4.7)
Alkaline Phosphatase: 75 IU/L (ref 44–121)
BUN/Creatinine Ratio: 19 (ref 10–24)
BUN: 53 mg/dL — ABNORMAL HIGH (ref 8–27)
Bilirubin Total: 0.5 mg/dL (ref 0.0–1.2)
CO2: 20 mmol/L (ref 20–29)
Calcium: 9.4 mg/dL (ref 8.6–10.2)
Chloride: 107 mmol/L — ABNORMAL HIGH (ref 96–106)
Creatinine, Ser: 2.83 mg/dL — ABNORMAL HIGH (ref 0.76–1.27)
Globulin, Total: 2.4 g/dL (ref 1.5–4.5)
Glucose: 115 mg/dL — ABNORMAL HIGH (ref 70–99)
Potassium: 5.1 mmol/L (ref 3.5–5.2)
Sodium: 141 mmol/L (ref 134–144)
Total Protein: 6.4 g/dL (ref 6.0–8.5)
eGFR: 22 mL/min/{1.73_m2} — ABNORMAL LOW (ref >59)

## 2022-03-02 LAB — TSH: TSH: 3.96 u[IU]/mL (ref 0.450–4.500)

## 2022-03-02 MED ORDER — FERROUS SULFATE 325 (65 FE) MG PO TBEC: 325.0000 mg | DELAYED_RELEASE_TABLET | Freq: Two times a day (BID) | ORAL | 3 refills | Status: DC

## 2022-03-02 NOTE — H&P (View-Only) (Signed)
That's fine

## 2022-03-02 NOTE — Progress Notes (Signed)
That's fine

## 2022-03-03 ENCOUNTER — Ambulatory Visit (HOSPITAL_COMMUNITY)
Admission: RE | Admit: 2022-03-03 | Discharge: 2022-03-03 | Disposition: A | Discharge status: Home / Self Care | Payer: Medicare PPO | Attending: Cardiology | Admitting: Cardiology

## 2022-03-03 ENCOUNTER — Ambulatory Visit (HOSPITAL_BASED_OUTPATIENT_CLINIC_OR_DEPARTMENT_OTHER): Payer: Medicare PPO | Admitting: Certified Registered"

## 2022-03-03 ENCOUNTER — Ambulatory Visit (HOSPITAL_COMMUNITY): Payer: Medicare PPO | Admitting: Certified Registered"

## 2022-03-03 ENCOUNTER — Encounter (HOSPITAL_COMMUNITY)
Admission: RE | Disposition: A | Discharge status: Home / Self Care | Payer: Self-pay | Source: Home / Self Care | Attending: Cardiology

## 2022-03-03 ENCOUNTER — Other Ambulatory Visit: Payer: Self-pay

## 2022-03-03 ENCOUNTER — Encounter (HOSPITAL_COMMUNITY): Payer: Self-pay | Admitting: Cardiology

## 2022-03-03 DIAGNOSIS — I129 Hypertensive chronic kidney disease with stage 1 through stage 4 chronic kidney disease, or unspecified chronic kidney disease: Secondary | ICD-10-CM

## 2022-03-03 DIAGNOSIS — Z7901 Long term (current) use of anticoagulants: Secondary | ICD-10-CM | POA: Diagnosis not present

## 2022-03-03 DIAGNOSIS — Z8041 Family history of malignant neoplasm of ovary: Secondary | ICD-10-CM | POA: Diagnosis not present

## 2022-03-03 DIAGNOSIS — I4891 Unspecified atrial fibrillation: Secondary | ICD-10-CM

## 2022-03-03 DIAGNOSIS — D631 Anemia in chronic kidney disease: Secondary | ICD-10-CM | POA: Diagnosis not present

## 2022-03-03 DIAGNOSIS — I5021 Acute systolic (congestive) heart failure: Secondary | ICD-10-CM | POA: Diagnosis not present

## 2022-03-03 DIAGNOSIS — I447 Left bundle-branch block, unspecified: Secondary | ICD-10-CM | POA: Insufficient documentation

## 2022-03-03 DIAGNOSIS — I130 Hypertensive heart and chronic kidney disease with heart failure and stage 1 through stage 4 chronic kidney disease, or unspecified chronic kidney disease: Secondary | ICD-10-CM | POA: Insufficient documentation

## 2022-03-03 DIAGNOSIS — I7121 Aneurysm of the ascending aorta, without rupture: Secondary | ICD-10-CM | POA: Insufficient documentation

## 2022-03-03 DIAGNOSIS — N189 Chronic kidney disease, unspecified: Secondary | ICD-10-CM | POA: Diagnosis not present

## 2022-03-03 DIAGNOSIS — Z950 Presence of cardiac pacemaker: Secondary | ICD-10-CM | POA: Diagnosis not present

## 2022-03-03 DIAGNOSIS — Z800 Family history of malignant neoplasm of digestive organs: Secondary | ICD-10-CM | POA: Insufficient documentation

## 2022-03-03 DIAGNOSIS — Z85528 Personal history of other malignant neoplasm of kidney: Secondary | ICD-10-CM | POA: Diagnosis not present

## 2022-03-03 DIAGNOSIS — N184 Chronic kidney disease, stage 4 (severe): Secondary | ICD-10-CM | POA: Insufficient documentation

## 2022-03-03 DIAGNOSIS — Z8554 Personal history of malignant neoplasm of ureter: Secondary | ICD-10-CM | POA: Insufficient documentation

## 2022-03-03 DIAGNOSIS — Z8551 Personal history of malignant neoplasm of bladder: Secondary | ICD-10-CM | POA: Insufficient documentation

## 2022-03-03 DIAGNOSIS — I484 Atypical atrial flutter: Secondary | ICD-10-CM | POA: Diagnosis not present

## 2022-03-03 DIAGNOSIS — Z803 Family history of malignant neoplasm of breast: Secondary | ICD-10-CM | POA: Diagnosis not present

## 2022-03-03 DIAGNOSIS — Z905 Acquired absence of kidney: Secondary | ICD-10-CM | POA: Insufficient documentation

## 2022-03-03 DIAGNOSIS — Z952 Presence of prosthetic heart valve: Secondary | ICD-10-CM | POA: Diagnosis not present

## 2022-03-03 DIAGNOSIS — D6869 Other thrombophilia: Secondary | ICD-10-CM | POA: Diagnosis not present

## 2022-03-03 HISTORY — PX: CARDIOVERSION: SHX1299

## 2022-03-03 SURGERY — CARDIOVERSION
Anesthesia: General

## 2022-03-03 MED ORDER — SODIUM CHLORIDE 0.9 % IV SOLN: INTRAVENOUS | Status: DC | PRN

## 2022-03-03 MED ORDER — PHENYLEPHRINE HCL (PRESSORS) 10 MG/ML IV SOLN
INTRAVENOUS | Status: DC | PRN
  Administered 2022-03-03: 80 ug via INTRAVENOUS

## 2022-03-03 MED ORDER — PROPOFOL 10 MG/ML IV BOLUS
INTRAVENOUS | Status: DC | PRN
  Administered 2022-03-03: 10 mg via INTRAVENOUS
  Administered 2022-03-03: 30 mg via INTRAVENOUS

## 2022-03-03 NOTE — Anesthesia Preprocedure Evaluation (Signed)
Anesthesia Evaluation  Patient identified by MRN, date of birth, ID band Patient awake    Reviewed: Allergy & Precautions, NPO status , Patient's Chart, lab work & pertinent test results  History of Anesthesia Complications Negative for: history of anesthetic complications  Airway Mallampati: IV  TM Distance: >3 FB Neck ROM: Full    Dental  (+) Poor Dentition, Dental Advisory Given   Pulmonary shortness of breath, neg COPD   breath sounds clear to auscultation       Cardiovascular hypertension, Pt. on medications + dysrhythmias Atrial Fibrillation + pacemaker  Rhythm:Irregular Rate:Tachycardia   1. Systolic dysfunction is new compared with the echo 01/2021.   2. Left ventricular ejection fraction, by estimation, is 20 to 25%. The  left ventricle has severely decreased function. The left ventricle  demonstrates regional wall motion abnormalities (see scoring  diagram/findings for description). There is mild  concentric left ventricular hypertrophy. Left ventricular diastolic  function could not be evaluated.   3. Right ventricular systolic function is mildly reduced. The right  ventricular size is normal. There is normal pulmonary artery systolic  pressure.   4. Left atrial size was severely dilated.   5. The mitral valve is normal in structure. Mild mitral valve  regurgitation. No evidence of mitral stenosis.   6. The aortic valve has been repaired/replaced. Aortic valve  regurgitation is not visualized. No aortic stenosis is present. There is a  29 mm Sapien prosthetic (TAVR) valve present in the aortic position.  Aortic valve mean gradient measures 5.8 mmHg.  Aortic valve Vmax measures 1.58 m/s.   7. Aortic dilatation noted. There is moderate dilatation of the ascending  aorta, measuring 43 mm.   8. The inferior vena cava is normal in size with greater than 50%  respiratory variability, suggesting right atrial pressure of 3  mmHg.       Neuro/Psych negative neurological ROS  negative psych ROS   GI/Hepatic Neg liver ROS,GERD  ,,  Endo/Other  negative endocrine ROS    Renal/GU CRFRenal diseaseLab Results      Component                Value               Date                      CREATININE               2.83 (H)            03/01/2022                Musculoskeletal negative musculoskeletal ROS (+)    Abdominal   Peds  Hematology  (+) Blood dyscrasia, anemia Lab Results      Component                Value               Date                      WBC                      5.8                 03/01/2022                HGB  9.5 (L)             03/01/2022                HCT                      32.2 (L)            03/01/2022                MCV                      84                  03/01/2022                PLT                      119 (L)             03/01/2022              Anesthesia Other Findings   Reproductive/Obstetrics                             Anesthesia Physical Anesthesia Plan  ASA: 3  Anesthesia Plan: General   Post-op Pain Management:    Induction: Intravenous  PONV Risk Score and Plan: 2 and Treatment may vary due to age or medical condition  Airway Management Planned: Mask  Additional Equipment: None  Intra-op Plan:   Post-operative Plan:   Informed Consent: I have reviewed the patients History and Physical, chart, labs and discussed the procedure including the risks, benefits and alternatives for the proposed anesthesia with the patient or authorized representative who has indicated his/her understanding and acceptance.     Dental advisory given  Plan Discussed with: CRNA  Anesthesia Plan Comments:        Anesthesia Quick Evaluation

## 2022-03-03 NOTE — Discharge Instructions (Signed)

## 2022-03-03 NOTE — Transfer of Care (Signed)
Immediate Anesthesia Transfer of Care Note  Patient: Jordan Oconnell  Procedure(s) Performed: CARDIOVERSION  Patient Location: Endoscopy Unit  Anesthesia Type:General  Level of Consciousness: awake, alert , and oriented  Airway & Oxygen Therapy: Patient Spontanous Breathing and Patient connected to face mask oxygen  Post-op Assessment: Report given to RN and Post -op Vital signs reviewed and stable  Post vital signs: Reviewed and stable  Last Vitals:  Vitals Value Taken Time  BP 107/80   Temp    Pulse 71   Resp 20   SpO2 100     Last Pain:  Vitals:   03/03/22 1153  TempSrc: Tympanic  PainSc: 0-No pain         Complications: No notable events documented.

## 2022-03-03 NOTE — Anesthesia Procedure Notes (Signed)
Date/Time: 03/03/2022 12:33 PM  Performed by: Griffin Dakin, CRNA

## 2022-03-03 NOTE — CV Procedure (Signed)
Procedure:   DCCV  Indication:  Symptomatic atrial fibrillation  Procedure Note:  The patient signed informed consent.  They have had had therapeutic anticoagulation with apixaban greater than 3 weeks.  Anesthesia was administered by Dr. Ermalene Postin.  Patient received 0 mg IV lidocaine and 40 mg IV propofol.Adequate airway was maintained throughout and vital followed per protocol.  They were cardioverted x 1 with 120J of biphasic synchronized energy.  They converted to atrial-paced, ventricular-paced rhythm initially and then normal sinus rhythm in the 70s.  There were no apparent complications.  The patient had normal neuro status and respiratory status post procedure with vitals stable as recorded elsewhere.    Follow up:  They will continue on current medical therapy and follow up with cardiology as scheduled.  Buford Dresser, MD PhD 03/03/2022 12:40 PM

## 2022-03-03 NOTE — Interval H&P Note (Signed)
History and Physical Interval Note:  03/03/2022 11:51 AM  Jordan Oconnell  has presented today for surgery, with the diagnosis of AFIB.  The various methods of treatment have been discussed with the patient and family. After consideration of risks, benefits and other options for treatment, the patient has consented to  Procedure(s): CARDIOVERSION (N/A) as a surgical intervention.  The patient's history has been reviewed, patient examined, no change in status, stable for surgery.  I have reviewed the patient's chart and labs.  Questions were answered to the patient's satisfaction.     Jordan Oconnell Harrell Gave

## 2022-03-04 NOTE — Anesthesia Postprocedure Evaluation (Signed)
Anesthesia Post Note  Patient: Jordan Oconnell  Procedure(s) Performed: CARDIOVERSION     Patient location during evaluation: Endoscopy Anesthesia Type: General Level of consciousness: awake and patient cooperative Pain management: pain level controlled Vital Signs Assessment: post-procedure vital signs reviewed and stable Respiratory status: spontaneous breathing, nonlabored ventilation and respiratory function stable Cardiovascular status: stable Postop Assessment: no apparent nausea or vomiting Anesthetic complications: no   No notable events documented.  Last Vitals:  Vitals:   03/03/22 1300 03/03/22 1308  BP: 105/75 108/74  Pulse: 77 75  Resp: (!) 22 (!) 24  Temp:    SpO2: 98% 97%    Last Pain:  Vitals:   03/03/22 1308  TempSrc:   PainSc: 0-No pain                 [name]

## 2022-03-05 ENCOUNTER — Telehealth: Payer: Self-pay | Admitting: Cardiovascular Disease

## 2022-03-05 NOTE — Telephone Encounter (Signed)
Pt stated he cannot afford prescription ferrous sulfate 325 (65 FE) mg. It is $76.00 for 5-monthsupply. Is there an OTC he can take that is not as expensive or what do you recommend?

## 2022-03-05 NOTE — Telephone Encounter (Signed)
I can see online where he can use a GoodRx coupon and get it for $8.24 at [facility] without using his insurance card (do not use Medicare). The goodRx coupon can be printed from the Internet. If he cannot do that, maybe we can print it for him and he can pick up. Or do our pharmD colleagues have another suggestion?

## 2022-03-05 NOTE — Telephone Encounter (Signed)
Pt c/o medication issue:  1. Name of Medication: ferrous sulfate 325 (65 FE) MG EC tablet   2. How are you currently taking this medication (dosage and times per day)? Not taking  3. Are you having a reaction (difficulty breathing--STAT)? no  4. What is your medication issue? Patient states the medication is too expensive and would like to know if there is an alternative.

## 2022-03-05 NOTE — Telephone Encounter (Signed)
Pt informed of [place] coupon and will try that at [facility]. He was very thankful for the information.

## 2022-03-05 NOTE — Telephone Encounter (Signed)
Patient states he had a $250 copay for his procedure and would like to know if this is normal.

## 2022-03-06 ENCOUNTER — Encounter (HOSPITAL_COMMUNITY): Payer: Self-pay | Admitting: Cardiology

## 2022-03-08 ENCOUNTER — Telehealth: Payer: Self-pay | Admitting: Cardiovascular Disease

## 2022-03-08 NOTE — Telephone Encounter (Signed)
Patient calling in regards to his procedure last week. Please advise

## 2022-03-08 NOTE — Telephone Encounter (Signed)
Spoke with the patient who states that he has not felt any better since his cardioversion. He still complains of fatigue. He states his heart rate has been all over the place. Gets up to 120 at times. He is taking medications has prescribed. Patient scheduled for a follow up visit with Renee this week.

## 2022-03-10 ENCOUNTER — Other Ambulatory Visit (HOSPITAL_COMMUNITY): Payer: Self-pay | Admitting: Internal Medicine

## 2022-03-10 DIAGNOSIS — C649 Malignant neoplasm of unspecified kidney, except renal pelvis: Secondary | ICD-10-CM

## 2022-03-10 DIAGNOSIS — C679 Malignant neoplasm of bladder, unspecified: Secondary | ICD-10-CM

## 2022-03-10 NOTE — Progress Notes (Unsigned)
Cardiology Office Note Date:  03/11/2022  Patient ID:  Jordan Oconnell, Jordan Oconnell 1940/08/25, MRN [id number] PCP:  Burnard Bunting, MD  Cardiologist:  Sanda Klein, MD Electrophysiologist: Vickie Epley, MD  Chief Complaint: SOB, fatigue, AF w RVR  History of Present Illness: Jordan Oconnell is a 82 y.o. male with PMH notable for parox Afib/flutter, tachy-brady s/p PPM, HFrEF (likely tachy-induced), s/p TAVR, HTN, CKD stage 4, urothelial carcinoma in R kidney; seen today for Vickie Epley, MD for acute visit due to fatigue, labile HR.    He saw Dr. Sallyanne Kuster 03/01/22 in follow-up. Patient had recent echo that showed marked reduction in EF with ventricular dyssynchrony, likely as a result of increased afib/flutter burden. GDMT limited by CKD; metop succinate up-titrated to 51m BID and amio initiated at 4010mdaily. Set up for DCCV.   S/p DCCV 2/7, with return to NSR. Called clinic 2/12 stating that he is fatigue and HR is all over the place.  Today, he presents with increased SOB with minimal activity and at rest. No energy at all. He is unaware of when he converted back to AF/Flutter. Overall has felt very poorly for 6-8 weeks like he has been "hit by a truck". He denies CP, pressure, edema.   Diligently taking eliquis BID, no bleeding concerns, no missed doses.    Device Information: BosSci dual chamber PM  AAD History: Flecainide - stopped d/t CAD Tikosyn considered - not candidate d/t CKD Amiodarone - initiated 03/01/22  Past Medical History:  Diagnosis Date   [place]adder cancer [place]   BPH (benign prostatic hyperplasia)    CKD (chronic kidney disease), stage IV (HCC)    Colon polyps    Diverticulosis    GERD (gastroesophageal reflux disease)    Hyperlipidemia    pt denies   Hypertension    [place]oxysmal [place] fibrillation) [place]   on Flecanide and diltiazem. No OAC given recurrent hematuria   Renal cell carcinoma ([facility]   left   S/P TAVR (transcatheter  aortic valve replacement) 01/27/2021   s/p TAVR with a 29 mm Edwards S3UR via the TF approach by Dr. CoBurt Knacknd Dr. BaCyndia Bent Severe aortic stenosis 10/05/2020    Past Surgical History:  Procedure Laterality Date   APPENDECTOMY     BLADDER SURGERY  2008   transurethral resection/resection of prostatic urethra   BOWEL RESECTION     CARDIOVERSION N/A 03/03/2022   Procedure: CARDIOVERSION;  Surgeon: ChBuford DresserMD;  [facility];  Service: Cardiovascular;  Laterality: N/A;   ESOPHAGOGASTRODUODENOSCOPY N/A 07/24/2013   Procedure: ESOPHAGOGASTRODUODENOSCOPY (EGD);  Surgeon: JoIrene ShipperMD;  [facility];  Service: Endoscopy;  Laterality: N/A;   INTRAOPERATIVE TRANSTHORACIC ECHOCARDIOGRAM N/A 01/27/2021   Procedure: INTRAOPERATIVE TRANSTHORACIC ECHOCARDIOGRAM;  Surgeon: CoSherren MochaMD;  [facility];  Service: Open Heart Surgery;  Laterality: N/A;   laparoscopic surgery  2012   laser   (?)   NEPHRECTOMY  2007   partial, left   NEPHROSTOMY  2011   stent   NM MYOCAR PERF WALL MOTION  09/08/2010   Normal   PACEMAKER IMPLANT N/A 02/12/2021   Procedure: PACEMAKER IMPLANT;  Surgeon: LaVickie EpleyMD;  [facility];  Service: Cardiovascular;  Laterality: N/A;   RIGHT HEART CATH AND CORONARY ANGIOGRAPHY N/A 10/06/2020   Procedure: RIGHT HEART CATH AND CORONARY ANGIOGRAPHY;  Surgeon: CoSherren MochaMD;  [facility];  Service: Cardiovascular;  Laterality:  N/A;   ROBOT ASSISTED LAPAROSCOPIC COMPLETE CYSTECT ILEAL CONDUIT     TRANSCATHETER AORTIC VALVE REPLACEMENT, TRANSFEMORAL Right 01/27/2021   Procedure: TRANSCATHETER AORTIC VALVE REPLACEMENT, TRANSFEMORAL;  Surgeon: Sherren Mocha, MD;  Location: [place] [facility] LAB;  Service: Open Heart Surgery;  Laterality: Right;   US ECHOCARDIOGRAPHY  0000000   mild diastolic dysfunction,mild dilated LA,mild MR,AI,mildly dilated aortic root    Current Outpatient  Medications  Medication Instructions   ALPRAZolam (XANAX) 0.25 mg, Oral, Daily at bedtime   amiodarone (PACERONE) 400 mg, Oral, Daily   azithromycin (ZITHROMAX) 500 MG tablet Take 1 tablet by mouth 1 hour before dental procedures and cleanings   Eliquis 2.5 mg, Oral, 2 times daily   ferrous sulfate 325 mg, Oral, 2 times daily   latanoprost (XALATAN) 0.005 % ophthalmic solution 1 drop, Both Eyes, Daily at bedtime   levothyroxine (SYNTHROID) 50 mcg, Oral, Daily at bedtime   metoprolol succinate (TOPROL-XL) 50 mg, Oral, 2 times daily, Take with or immediately following a meal.   mirtazapine (REMERON) 7.5 mg, Oral, Daily at bedtime   Pembrolizumab (KEYTRUDA IV) Intravenous, Every 6 weeks, Infusion    sodium bicarbonate 1,300 mg, Oral, 2 times daily      Social History:  The patient  reports that he has never smoked. He has never used smokeless tobacco. He reports current alcohol use. He reports that he does not use drugs.   Family History:  The patient's family history includes Breast cancer in his daughter; Colon cancer in his brother, father, and mother; Crohn's disease in his son; Ovarian cancer in his mother.  ROS:  Please see the history of present illness. All other systems are reviewed and otherwise negative.   PHYSICAL EXAM:  VS:  BP 116/86   Pulse (!) 121   Ht 6' 1"$  (1.854 m)   Wt 183 lb (83 kg)   SpO2 93%   BMI 24.14 kg/m  BMI: Body mass index is 24.14 kg/m.  GEN- The patient is ill- appearing, somewhat pale, alert and oriented x 3    HEENT: normocephalic, atraumatic; sclera clear, conjunctiva pink; hearing intact; oropharynx clear; neck supple, no JVP Lungs- Clear to ausculation bilaterally, tachypnic, increased work of breathing.  No wheezes, rales, rhonchi Heart- Irregularly irregular, tachy rate and rhythm, no murmurs, rubs or gallops, PMI not laterally displaced GI- extremely large L abd hernia, R urostomy tube in place, draining. soft, non-tender, non-distended, bowel  sounds present, no hepatosplenomegaly Extremities- No peripheral edema. Cool extremities MS- no significant deformity or atrophy Skin- warm and dry, no rash or lesion,  device pocket well-healed Psych- euthymic mood, full affect Neuro- strength and sensation are intact   Device interrogation done today and reviewed by myself:  Presents in Aflutter Battery good Unable to perform threshold d/t Aflutter w RVR Lead impedence and sensing stable  Near 100% AF/AT episodes No changes made today   EKG is ordered. Personal review of EKG from today shows: Wide complex tachycardia, rate 121; known LBBB, poor R-wave progression   Recent Labs: 03/01/2022: ALT 11; BUN 53; Creatinine, Ser 2.83; Hemoglobin 9.5; Platelets 119; Potassium 5.1; Sodium 141; TSH 3.960  No results found for requested labs within last 365 days.   Estimated Creatinine Clearance: 22.7 mL/min (A) (by C-G formula based on SCr of 2.83 mg/dL (H)).   Wt Readings from Last 3 Encounters:  03/11/22 183 lb (83 kg)  03/03/22 188 lb (85.3 kg)  03/01/22 188 lb (85.3 kg)     Additional studies reviewed  include: Previous EP, cardiology notes.   TTE 02/19/22  1. Systolic dysfunction is new compared with the echo 01/2021.   2. Left ventricular ejection fraction, by estimation, is 20 to 25%. The left ventricle has severely decreased function. The left ventricle demonstrates regional wall motion abnormalities (see scoring diagram/findings for description). There is mild  concentric left ventricular hypertrophy. Left ventricular diastolic function could not be evaluated.   3. Right ventricular systolic function is mildly reduced. The right ventricular size is normal. There is normal pulmonary artery systolic pressure.   4. Left atrial size was severely dilated.   5. The mitral valve is normal in structure. Mild mitral valve regurgitation. No evidence of mitral stenosis.   6. The aortic valve has been repaired/replaced. Aortic valve  regurgitation is not visualized. No aortic stenosis is present. There is a 29 mm Sapien prosthetic (TAVR) valve present in the aortic position. Aortic valve mean gradient measures 5.8 mmHg. Aortic valve Vmax measures 1.58 m/s.   7. Aortic dilatation noted. There is moderate dilatation of the ascending aorta, measuring 43 mm.   8. The inferior vena cava is normal in size with greater than 50% respiratory variability, suggesting right atrial pressure of 3 mmHg.   TTE 02/20/21  1. Abnormal septal motion . Left ventricular ejection fraction, by estimation, is 55%. The left ventricle has normal function. The left ventricle has no regional wall motion abnormalities. Left ventricular diastolic parameters were normal.   2. Pacing wires in RA/RV. Right ventricular systolic function is normal. The right ventricular size is normal.   3. Left atrial size was moderately dilated.   4. The mitral valve is abnormal. Mild mitral valve regurgitation. No evidence of mitral stenosis.   5. Post TAVR with 29 mm Sapien 3 valve no significant PVL gradients have increased since previous echo . The aortic valve has been repaired/replaced. Aortic valve regurgitation is not visualized. No aortic stenosis is present.   6. Aortic dilatation noted. There is moderate dilatation of the ascending aorta, measuring 43 mm.   7. The inferior vena cava is normal in size with greater than 50% respiratory variability, suggesting right atrial pressure of 3 mmHg.   ASSESSMENT AND PLAN:  #) tachy-brady syndrome s/p Bos Sci PPM #) Aflutter w RVR #) SOB, fatigue Recently started amiodarone 03/01/21 DCCV 03/03/21 Patient states he never felt better s/p CV, and has felt consistently terrible for past 6-8 weeks Ventricular rates consistently elevated Given new tachypnea, SOB at rest, I have recommended the patient proceed to ER for further evaluation He wanted to drive home first and have partner take him to ER I did not think he warranted EMS  transport given stable BP, not diaphoretic, non-toxic appearing   Case discussed with DOD (Dr. Lovena Le), who assessed patient    #) Hypercoag d/t Afib/flutter CHA2DS2-VASc Score = 4 [CHF History: 1, HTN History: 1, Diabetes History: 0, Stroke History: 0, Vascular Disease History: 0, Age Score: 2, Gender Score: 0].  Therefore, the patient's annual risk of stroke is 4.8 %  OAC - eliquis 2.66m BID, appropriately dose reduced for age, Cr     Current medicines are reviewed at length with the patient today.   The patient does not have concerns regarding his medicines.  The following changes were made today:  none  Labs/ tests ordered today include:  No orders of the defined types were placed in this encounter.    Disposition: Proceed to ER for further evaluation and workup   Signed, SNunzio Cobbs  Lillah Standre, NP  03/11/22  3:57 PM  Electrophysiology [facility]

## 2022-03-11 ENCOUNTER — Other Ambulatory Visit: Payer: Self-pay

## 2022-03-11 ENCOUNTER — Emergency Department (HOSPITAL_COMMUNITY): Payer: Medicare PPO

## 2022-03-11 ENCOUNTER — Encounter (HOSPITAL_COMMUNITY): Payer: Self-pay

## 2022-03-11 ENCOUNTER — Ambulatory Visit: Payer: Medicare PPO | Attending: Physician Assistant | Admitting: Cardiology

## 2022-03-11 ENCOUNTER — Encounter: Payer: Self-pay | Admitting: Cardiology

## 2022-03-11 ENCOUNTER — Inpatient Hospital Stay (HOSPITAL_COMMUNITY)
Admission: EM | Admit: 2022-03-11 | Discharge: 2022-03-22 | DRG: 308 | Disposition: A | Discharge status: Home Health | Payer: Medicare PPO | Source: Ambulatory Visit | Attending: Internal Medicine | Admitting: Internal Medicine

## 2022-03-11 VITALS — BP 116/86 | HR 121 | Ht 73.0 in | Wt 183.0 lb

## 2022-03-11 DIAGNOSIS — R5383 Other fatigue: Secondary | ICD-10-CM | POA: Diagnosis not present

## 2022-03-11 DIAGNOSIS — Z8551 Personal history of malignant neoplasm of bladder: Secondary | ICD-10-CM | POA: Diagnosis not present

## 2022-03-11 DIAGNOSIS — D631 Anemia in chronic kidney disease: Secondary | ICD-10-CM | POA: Diagnosis not present

## 2022-03-11 DIAGNOSIS — I129 Hypertensive chronic kidney disease with stage 1 through stage 4 chronic kidney disease, or unspecified chronic kidney disease: Secondary | ICD-10-CM | POA: Diagnosis not present

## 2022-03-11 DIAGNOSIS — J90 Pleural effusion, not elsewhere classified: Secondary | ICD-10-CM | POA: Diagnosis not present

## 2022-03-11 DIAGNOSIS — E039 Hypothyroidism, unspecified: Secondary | ICD-10-CM | POA: Diagnosis present

## 2022-03-11 DIAGNOSIS — I495 Sick sinus syndrome: Secondary | ICD-10-CM | POA: Insufficient documentation

## 2022-03-11 DIAGNOSIS — R0602 Shortness of breath: Secondary | ICD-10-CM | POA: Diagnosis not present

## 2022-03-11 DIAGNOSIS — C679 Malignant neoplasm of bladder, unspecified: Secondary | ICD-10-CM | POA: Diagnosis present

## 2022-03-11 DIAGNOSIS — Z905 Acquired absence of kidney: Secondary | ICD-10-CM

## 2022-03-11 DIAGNOSIS — I5023 Acute on chronic systolic (congestive) heart failure: Secondary | ICD-10-CM | POA: Diagnosis not present

## 2022-03-11 DIAGNOSIS — Z950 Presence of cardiac pacemaker: Secondary | ICD-10-CM | POA: Diagnosis not present

## 2022-03-11 DIAGNOSIS — Z953 Presence of xenogenic heart valve: Secondary | ICD-10-CM | POA: Diagnosis not present

## 2022-03-11 DIAGNOSIS — N179 Acute kidney failure, unspecified: Secondary | ICD-10-CM | POA: Diagnosis present

## 2022-03-11 DIAGNOSIS — I4891 Unspecified atrial fibrillation: Secondary | ICD-10-CM

## 2022-03-11 DIAGNOSIS — I255 Ischemic cardiomyopathy: Secondary | ICD-10-CM | POA: Diagnosis not present

## 2022-03-11 DIAGNOSIS — D509 Iron deficiency anemia, unspecified: Secondary | ICD-10-CM | POA: Diagnosis present

## 2022-03-11 DIAGNOSIS — N184 Chronic kidney disease, stage 4 (severe): Secondary | ICD-10-CM | POA: Diagnosis present

## 2022-03-11 DIAGNOSIS — E785 Hyperlipidemia, unspecified: Secondary | ICD-10-CM | POA: Diagnosis present

## 2022-03-11 DIAGNOSIS — Z8554 Personal history of malignant neoplasm of ureter: Secondary | ICD-10-CM

## 2022-03-11 DIAGNOSIS — Z79899 Other long term (current) drug therapy: Secondary | ICD-10-CM

## 2022-03-11 DIAGNOSIS — Z936 Other artificial openings of urinary tract status: Secondary | ICD-10-CM | POA: Diagnosis not present

## 2022-03-11 DIAGNOSIS — R0682 Tachypnea, not elsewhere classified: Secondary | ICD-10-CM | POA: Diagnosis not present

## 2022-03-11 DIAGNOSIS — E8729 Other acidosis: Secondary | ICD-10-CM | POA: Diagnosis not present

## 2022-03-11 DIAGNOSIS — K219 Gastro-esophageal reflux disease without esophagitis: Secondary | ICD-10-CM | POA: Diagnosis present

## 2022-03-11 DIAGNOSIS — I484 Atypical atrial flutter: Secondary | ICD-10-CM | POA: Diagnosis not present

## 2022-03-11 DIAGNOSIS — D6869 Other thrombophilia: Secondary | ICD-10-CM | POA: Diagnosis not present

## 2022-03-11 DIAGNOSIS — I130 Hypertensive heart and chronic kidney disease with heart failure and stage 1 through stage 4 chronic kidney disease, or unspecified chronic kidney disease: Secondary | ICD-10-CM | POA: Diagnosis present

## 2022-03-11 DIAGNOSIS — I428 Other cardiomyopathies: Secondary | ICD-10-CM | POA: Diagnosis present

## 2022-03-11 DIAGNOSIS — I5043 Acute on chronic combined systolic (congestive) and diastolic (congestive) heart failure: Secondary | ICD-10-CM

## 2022-03-11 DIAGNOSIS — Z85528 Personal history of other malignant neoplasm of kidney: Secondary | ICD-10-CM | POA: Diagnosis not present

## 2022-03-11 DIAGNOSIS — E878 Other disorders of electrolyte and fluid balance, not elsewhere classified: Secondary | ICD-10-CM | POA: Diagnosis present

## 2022-03-11 DIAGNOSIS — I441 Atrioventricular block, second degree: Secondary | ICD-10-CM | POA: Diagnosis present

## 2022-03-11 DIAGNOSIS — Z880 Allergy status to penicillin: Secondary | ICD-10-CM

## 2022-03-11 DIAGNOSIS — K8020 Calculus of gallbladder without cholecystitis without obstruction: Secondary | ICD-10-CM | POA: Diagnosis not present

## 2022-03-11 DIAGNOSIS — N400 Enlarged prostate without lower urinary tract symptoms: Secondary | ICD-10-CM | POA: Diagnosis present

## 2022-03-11 DIAGNOSIS — D696 Thrombocytopenia, unspecified: Secondary | ICD-10-CM | POA: Diagnosis present

## 2022-03-11 DIAGNOSIS — J9601 Acute respiratory failure with hypoxia: Secondary | ICD-10-CM | POA: Diagnosis not present

## 2022-03-11 DIAGNOSIS — C680 Malignant neoplasm of urethra: Secondary | ICD-10-CM | POA: Diagnosis not present

## 2022-03-11 DIAGNOSIS — N189 Chronic kidney disease, unspecified: Secondary | ICD-10-CM | POA: Diagnosis not present

## 2022-03-11 DIAGNOSIS — Z7901 Long term (current) use of anticoagulants: Secondary | ICD-10-CM

## 2022-03-11 DIAGNOSIS — Z8041 Family history of malignant neoplasm of ovary: Secondary | ICD-10-CM

## 2022-03-11 DIAGNOSIS — C642 Malignant neoplasm of left kidney, except renal pelvis: Secondary | ICD-10-CM | POA: Diagnosis not present

## 2022-03-11 DIAGNOSIS — C671 Malignant neoplasm of dome of bladder: Secondary | ICD-10-CM | POA: Diagnosis not present

## 2022-03-11 DIAGNOSIS — I4819 Other persistent atrial fibrillation: Secondary | ICD-10-CM | POA: Diagnosis not present

## 2022-03-11 DIAGNOSIS — I2510 Atherosclerotic heart disease of native coronary artery without angina pectoris: Secondary | ICD-10-CM | POA: Diagnosis present

## 2022-03-11 DIAGNOSIS — J9691 Respiratory failure, unspecified with hypoxia: Secondary | ICD-10-CM | POA: Diagnosis not present

## 2022-03-11 DIAGNOSIS — I4892 Unspecified atrial flutter: Secondary | ICD-10-CM | POA: Diagnosis not present

## 2022-03-11 DIAGNOSIS — Z800 Family history of malignant neoplasm of digestive organs: Secondary | ICD-10-CM

## 2022-03-11 DIAGNOSIS — I447 Left bundle-branch block, unspecified: Secondary | ICD-10-CM | POA: Diagnosis present

## 2022-03-11 DIAGNOSIS — J9811 Atelectasis: Secondary | ICD-10-CM | POA: Diagnosis not present

## 2022-03-11 DIAGNOSIS — Z79890 Hormone replacement therapy (postmenopausal): Secondary | ICD-10-CM

## 2022-03-11 DIAGNOSIS — I10 Essential (primary) hypertension: Secondary | ICD-10-CM | POA: Diagnosis present

## 2022-03-11 DIAGNOSIS — I259 Chronic ischemic heart disease, unspecified: Secondary | ICD-10-CM | POA: Diagnosis present

## 2022-03-11 HISTORY — DX: Unspecified atrial fibrillation: I48.91

## 2022-03-11 LAB — BASIC METABOLIC PANEL
Anion gap: 13 (ref 5–15)
BUN: 68 mg/dL — ABNORMAL HIGH (ref 8–23)
CO2: 17 mmol/L — ABNORMAL LOW (ref 22–32)
Calcium: 9.3 mg/dL (ref 8.9–10.3)
Chloride: 108 mmol/L (ref 98–111)
Creatinine, Ser: 3.08 mg/dL — ABNORMAL HIGH (ref 0.61–1.24)
GFR, Estimated: 19 mL/min — ABNORMAL LOW (ref >60)
Glucose, Bld: 93 mg/dL (ref 70–99)
Potassium: 4.8 mmol/L (ref 3.5–5.1)
Sodium: 138 mmol/L (ref 135–145)

## 2022-03-11 LAB — CBC WITH DIFFERENTIAL/PLATELET
Abs Immature Granulocytes: 0.04 10*3/uL (ref 0.00–0.07)
Basophils Absolute: 0.1 10*3/uL (ref 0.0–0.1)
Basophils Relative: 1 %
Eosinophils Absolute: 0.1 10*3/uL (ref 0.0–0.5)
Eosinophils Relative: 2 %
HCT: 34.4 % — ABNORMAL LOW (ref 39.0–52.0)
Hemoglobin: 10 g/dL — ABNORMAL LOW (ref 13.0–17.0)
Immature Granulocytes: 1 %
Lymphocytes Relative: 16 %
Lymphs Abs: 1.1 10*3/uL (ref 0.7–4.0)
MCH: 25.6 pg — ABNORMAL LOW (ref 26.0–34.0)
MCHC: 29.1 g/dL — ABNORMAL LOW (ref 30.0–36.0)
MCV: 88 fL (ref 80.0–100.0)
Monocytes Absolute: 0.4 10*3/uL (ref 0.1–1.0)
Monocytes Relative: 6 %
Neutro Abs: 5.3 10*3/uL (ref 1.7–7.7)
Neutrophils Relative %: 74 %
Platelets: 109 10*3/uL — ABNORMAL LOW (ref 150–400)
RBC: 3.91 MIL/uL — ABNORMAL LOW (ref 4.22–5.81)
RDW: 20.8 % — ABNORMAL HIGH (ref 11.5–15.5)
WBC: 7 10*3/uL (ref 4.0–10.5)
nRBC: 0 % (ref 0.0–0.2)

## 2022-03-11 LAB — CUP PACEART INCLINIC DEVICE CHECK
Date Time Interrogation Session: 20240215162507
Implantable Lead Connection Status: 753985
Implantable Lead Connection Status: 753985
Implantable Lead Implant Date: 20230123
Implantable Lead Implant Date: 20230123
Implantable Lead Location: 753859
Implantable Lead Location: 753860
Implantable Lead Model: 7841
Implantable Lead Model: 7842
Implantable Lead Serial Number: 1101059
Implantable Lead Serial Number: 1169597
Implantable Pulse Generator Implant Date: 20230123
Pulse Gen Serial Number: 561085

## 2022-03-11 LAB — TROPONIN I (HIGH SENSITIVITY)
Troponin I (High Sensitivity): 152 ng/L (ref <18)
Troponin I (High Sensitivity): 133 ng/L (ref <18)

## 2022-03-11 LAB — BRAIN NATRIURETIC PEPTIDE: B Natriuretic Peptide: 3602.4 pg/mL — ABNORMAL HIGH (ref 0.0–100.0)

## 2022-03-11 LAB — MAGNESIUM: Magnesium: 2.5 mg/dL — ABNORMAL HIGH (ref 1.7–2.4)

## 2022-03-11 MED ORDER — APIXABAN 2.5 MG PO TABS
2.5000 mg | ORAL_TABLET | Freq: Two times a day (BID) | ORAL | Status: DC
  Administered 2022-03-12 – 2022-03-22 (×21): 2.5 mg via ORAL
  Filled 2022-03-11 (×22): qty 1

## 2022-03-11 MED ORDER — AMIODARONE HCL IN DEXTROSE 360-4.14 MG/200ML-% IV SOLN
60.0000 mg/h | INTRAVENOUS | Status: AC
  Administered 2022-03-11: 60 mg/h via INTRAVENOUS
  Filled 2022-03-11 (×2): qty 200

## 2022-03-11 MED ORDER — LEVOTHYROXINE SODIUM 50 MCG PO TABS
50.0000 ug | ORAL_TABLET | Freq: Every day | ORAL | Status: DC
  Administered 2022-03-12 – 2022-03-21 (×11): 50 ug via ORAL
  Filled 2022-03-11 (×4): qty 1
  Filled 2022-03-11: qty 2
  Filled 2022-03-11 (×6): qty 1

## 2022-03-11 MED ORDER — ONDANSETRON HCL 4 MG/2ML IJ SOLN: 4.0000 mg | Freq: Four times a day (QID) | INTRAMUSCULAR | Status: DC | PRN

## 2022-03-11 MED ORDER — ALPRAZOLAM 0.25 MG PO TABS
0.2500 mg | ORAL_TABLET | Freq: Every day | ORAL | Status: DC
  Administered 2022-03-12 – 2022-03-21 (×11): 0.25 mg via ORAL
  Filled 2022-03-11 (×11): qty 1

## 2022-03-11 MED ORDER — SODIUM BICARBONATE 650 MG PO TABS
1300.0000 mg | ORAL_TABLET | Freq: Two times a day (BID) | ORAL | Status: DC
  Administered 2022-03-12 – 2022-03-18 (×13): 1300 mg via ORAL
  Filled 2022-03-11 (×14): qty 2

## 2022-03-11 MED ORDER — LATANOPROST 0.005 % OP SOLN
1.0000 [drp] | Freq: Every day | OPHTHALMIC | Status: DC
  Administered 2022-03-12 – 2022-03-21 (×10): 1 [drp] via OPHTHALMIC
  Filled 2022-03-11 (×2): qty 2.5

## 2022-03-11 MED ORDER — FERROUS SULFATE 325 (65 FE) MG PO TABS
325.0000 mg | ORAL_TABLET | Freq: Every day | ORAL | Status: DC
  Administered 2022-03-12 – 2022-03-22 (×11): 325 mg via ORAL
  Filled 2022-03-11 (×12): qty 1

## 2022-03-11 MED ORDER — AMIODARONE HCL IN DEXTROSE 360-4.14 MG/200ML-% IV SOLN
30.0000 mg/h | INTRAVENOUS | Status: DC
  Administered 2022-03-12 – 2022-03-14 (×4): 30 mg/h via INTRAVENOUS
  Filled 2022-03-11 (×4): qty 200

## 2022-03-11 MED ORDER — ONDANSETRON HCL 4 MG PO TABS: 4.0000 mg | ORAL_TABLET | Freq: Four times a day (QID) | ORAL | Status: DC | PRN

## 2022-03-11 MED ORDER — LEVALBUTEROL HCL 0.63 MG/3ML IN NEBU: 0.6300 mg | INHALATION_SOLUTION | Freq: Four times a day (QID) | RESPIRATORY_TRACT | Status: DC | PRN

## 2022-03-11 MED ORDER — AMIODARONE LOAD VIA INFUSION
150.0000 mg | Freq: Once | INTRAVENOUS | Status: AC
  Administered 2022-03-11: 150 mg via INTRAVENOUS
  Filled 2022-03-11: qty 83.34

## 2022-03-11 NOTE — H&P (Addendum)
History and Physical    Jordan Oconnell [id number] DOB: [birth year] DOA: 03/11/2022  PCP: Burnard Bunting, MD  Patient coming from: home I have personally briefly reviewed patient's old medical records in [place]  Chief Complaint: elevated heart  rate /sob/fatigue  HPI: Jordan Oconnell is a 82 y.o. 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 Due to this he followed up with cardiology today for further evaluation.Patient was found with being afib with rvr with sequela of CHF due uncontrolled Afib.  Patient was then referred to ED.  Patient noted no chest pain pain, n/v/d/fever/chills Albin Fischer /dysuria or uri symptoms. ED Course:  Vitals afeb, bp104/82 , [id number], rr 20 sat 99% onr a  [id number] rvr Wbc 7, hbg 10 (9.5),  plt 109 NA 138, K4.8, bicarb 17, cr 3.08( 2.83) [id number], bnp 3602.4 Mag 2.5 Cxr: low lung volume Tx amiodarone Cardiology consulted and recommended  initiation of amiodarone with plans for possible cardioversion in am   Review of Systems: As per HPI otherwise 10 point review of systems negative.   Past Medical History:  Diagnosis Date   Bladder cancer ([place])    BPH (benign prostatic hyperplasia)    CKD (chronic kidney disease), stage IV (HCC)    Colon polyps    Diverticulosis    GERD (gastroesophageal reflux disease)    Hyperlipidemia    pt denies   Hypertension    PAF (paroxysmal atrial fibrillation) ([place])    on Flecanide and diltiazem. 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 Burt Knack and Dr. Cyndia Bent   Severe  aortic stenosis 10/05/2020    Past Surgical History:  Procedure Laterality Date   APPENDECTOMY     BLADDER SURGERY  2008   transurethral resection/resection of prostatic urethra   BOWEL RESECTION     CARDIOVERSION N/A 03/03/2022   Procedure: CARDIOVERSION;  Surgeon: Buford Dresser, MD;  Location: [facility] ENDOSCOPY;  Service: Cardiovascular;  Laterality: N/A;   ESOPHAGOGASTRODUODENOSCOPY N/A 07/24/2013   Procedure: ESOPHAGOGASTRODUODENOSCOPY (EGD);  Surgeon: [name], MD;  Location: [name] ENDOSCOPY;  Service: Endoscopy;  Laterality: N/A;   INTRAOPERATIVE TRANSTHORACIC ECHOCARDIOGRAM N/A 01/27/2021   Procedure: INTRAOPERATIVE TRANSTHORACIC ECHOCARDIOGRAM;  Surgeon: Sherren Mocha, MD;  Location: [place] CV LAB;  Service: Open Heart Surgery;  Laterality: N/A;   laparoscopic surgery  2012   laser   (?)   NEPHRECTOMY  2007   partial, left   NEPHROSTOMY  2011   stent   NM MYOCAR PERF WALL MOTION  09/08/2010   Normal   PACEMAKER IMPLANT N/A 02/12/2021   Procedure: PACEMAKER IMPLANT;  Surgeon: Vickie Epley, MD;  Location: [place] [facility] LAB;  Service: Cardiovascular;  Laterality: N/A;   RIGHT HEART CATH AND CORONARY ANGIOGRAPHY N/A 10/06/2020   Procedure: RIGHT HEART CATH AND CORONARY ANGIOGRAPHY;  Surgeon: Sherren Mocha, MD;  Location: [place] [facility] LAB;  Service: Cardiovascular;  Laterality: N/A;   ROBOT ASSISTED LAPAROSCOPIC COMPLETE CYSTECT ILEAL CONDUIT     TRANSCATHETER AORTIC VALVE REPLACEMENT, TRANSFEMORAL Right 01/27/2021   Procedure: TRANSCATHETER AORTIC VALVE REPLACEMENT, TRANSFEMORAL;  Surgeon: Sherren Mocha, MD;  Location: [place] [facility] LAB;  Service: Open Heart Surgery;  Laterality: Right;   US ECHOCARDIOGRAPHY  0000000   mild diastolic dysfunction,mild dilated LA,mild MR,AI,mildly dilated aortic root     reports that he has never smoked. He has never used smokeless tobacco. He reports current alcohol use. He reports that he does not use drugs.  Allergies   Allergen Reactions   Amoxicillin Other (See Comments)    Acute interstitial nephritis   Doxycycline Hives   Flomax [Tamsulosin Hcl] Other (See Comments)    Dizzy     Family History  Problem Relation Age of Onset   Ovarian cancer Mother    Colon cancer Mother    Colon cancer Father    Crohn's disease Son    Breast cancer Daughter    Colon cancer Brother    Prior to Admission medications   Medication Sig Start Date End Date Taking? Authorizing Provider  ALPRAZolam (XANAX) 0.25 MG tablet Take 0.25 mg by mouth at bedtime.    [provider]  amiodarone (PACERONE) 400 MG tablet Take 1 tablet (400 mg total) by mouth daily. 03/01/22   Croitoru, Mihai, MD  azithromycin (ZITHROMAX) 500 MG tablet Take 1 tablet by mouth 1 hour before dental procedures and cleanings 02/06/21   Eileen Stanford, PA-C  ELIQUIS 2.5 MG TABS tablet Take 2.5 mg by mouth 2 (two) times daily. 01/28/22   [provider]  ferrous sulfate 325 (65 FE) MG EC tablet Take 1 tablet (325 mg total) by mouth in the morning and at bedtime. 03/02/22   Croitoru, Mihai, MD  latanoprost (XALATAN) 0.005 % ophthalmic solution Place 1 drop into both eyes at bedtime. 09/08/20   [provider]  levothyroxine (SYNTHROID) 50 MCG tablet Take 50 mcg by mouth at bedtime.    [provider]  metoprolol succinate (TOPROL-XL) 50 MG 24 hr tablet Take 1 tablet (50 mg total) by mouth in the morning and at bedtime. Take with or immediately following a meal. Patient taking differently: Take 25 mg by mouth in the morning and at bedtime. Take with or immediately following a meal. 03/01/22   Croitoru, Mihai, MD  mirtazapine (REMERON) 15 MG tablet Take 7.5 mg by mouth at bedtime. 08/09/20   [provider]  Pembrolizumab (KEYTRUDA IV) Inject into the vein every 6 (six) weeks. Infusion    [provider]  sodium bicarbonate 650 MG tablet Take 1,300 mg by mouth 2 (two) times daily. 09/01/20   [provider]  hydrALAZINE (APRESOLINE) 50 MG tablet Take 50 mg by mouth 2 (two) times daily. Patient not taking: Reported on 03/01/2022  03/01/22  [provider]    Physical Exam: Vitals:   03/11/22 2045 03/11/22 2100 03/11/22 2147 03/11/22 2200  BP: 104/79 109/80 106/84 97/79  Pulse: (!) 112 (!) 113 (!) 113 (!) 118  Resp: (!) 29 13  (!) 22  Temp:   97.8 F (36.6 C)   TempSrc:   Oral   SpO2: 99% 97% 99% 98%    Constitutional: NAD, calm, comfortable Vitals:   03/11/22 2045 03/11/22 2100 03/11/22 2147 03/11/22 2200  BP: 104/79 109/80 106/84 97/79  Pulse: (!) 112 (!) 113 (!) 113 (!) 118  Resp: (!) 29 13  (!) 22  Temp:   97.8 F (36.6 C)   TempSrc:   Oral   SpO2: 99% 97% 99% 98%   Eyes: PERRL, lids and conjunctivae normal ENMT: Mucous membranes are moist. Posterior  pharynx clear of any exudate or lesions.Normal dentition.  Neck: normal, supple, no masses, no thyromegaly Respiratory: clear to auscultation bilaterally, no wheezing, no crackles. Normal respiratory effort. No accessory muscle use.  Cardiovascular: irregular rhythm, tachy .no murmurs / rubs / gallops. No extremity edema. 2+ pedal pulses.   Abdomen: no tenderness, no masses palpated. No hepatosplenomegaly. Bowel sounds positive.  Musculoskeletal: no clubbing / cyanosis. No joint deformity upper and lower extremities. Good ROM, no contractures. Normal muscle tone.  Skin: no rashes, lesions, ulcers. No induration Neurologic: CN 2-12 grossly intact. Sensation intact. Strength 5/5 in all 4.  Psychiatric: Normal judgment and insight. Alert and oriented x 3. Normal mood.    Labs on Admission: I have personally reviewed following labs and imaging studies  CBC: Recent Labs  Lab 03/11/22 1811  WBC 7.0  NEUTROABS 5.3  HGB 10.0*  HCT 34.4*  MCV 88.0  PLT 0000000*   Basic Metabolic Panel: Recent Labs  Lab 03/11/22 1811 03/11/22 2023  NA 138  --   K 4.8  --   CL 108  --   CO2 17*  --   GLUCOSE 93  --   BUN 68*  --    CREATININE 3.08*  --   CALCIUM 9.3  --   MG  --  2.5*   GFR: Estimated Creatinine Clearance: 20.9 mL/min (A) (by C-G formula based on SCr of 3.08 mg/dL (H)). Liver Function Tests: No results for input(s): "AST", "ALT", "ALKPHOS", "BILITOT", "PROT", "ALBUMIN" in the last 168 hours. No results for input(s): "LIPASE", "AMYLASE" in the last 168 hours. No results for input(s): "AMMONIA" in the last 168 hours. Coagulation Profile: No results for input(s): "INR", "PROTIME" in the last 168 hours. Cardiac Enzymes: No results for input(s): "CKTOTAL", "CKMB", "CKMBINDEX", "TROPONINI" in the last 168 hours. BNP (last 3 results) No results for input(s): "PROBNP" in the last 8760 hours. HbA1C: No results for input(s): "HGBA1C" in the last 72 hours. CBG: No results for input(s): "GLUCAP" in the last 168 hours. Lipid Profile: No results for input(s): "CHOL", "HDL", "LDLCALC", "TRIG", "CHOLHDL", "LDLDIRECT" in the last 72 hours. Thyroid Function Tests: No results for input(s): "TSH", "T4TOTAL", "FREET4", "T3FREE", "THYROIDAB" in the last 72 hours. Anemia Panel: No results for input(s): "VITAMINB12", "FOLATE", "FERRITIN", "TIBC", "IRON", "RETICCTPCT" in the last 72 hours. Urine analysis:    Component Value Date/Time   COLORURINE YELLOW 01/23/2021 1000   APPEARANCEUR CLOUDY (A) 01/23/2021 1000   LABSPEC 1.010 01/23/2021 1000   PHURINE 8.0 01/23/2021 1000   GLUCOSEU NEGATIVE 01/23/2021 1000   HGBUR SMALL (A) 01/23/2021 1000   BILIRUBINUR NEGATIVE 01/23/2021 1000   KETONESUR NEGATIVE 01/23/2021 1000   PROTEINUR 100 (A) 01/23/2021 1000   UROBILINOGEN 0.2 07/17/2013 1146   NITRITE POSITIVE (A) 01/23/2021 1000   LEUKOCYTESUR LARGE (A) 01/23/2021 1000    Radiological Exams on Admission: DG Chest Port 1 View  Result Date: 03/11/2022 CLINICAL DATA:  Shortness of breath. EXAM: PORTABLE CHEST 1 VIEW COMPARISON:  February 13, 2021 FINDINGS: Stable dual lead AICD positioning is seen. The cardiac  silhouette is mildly enlarged and unchanged in size. An artificial aortic valve is noted. Low lung volumes are seen with mild areas of atelectasis within the bilateral lung bases, right greater than left. There is no evidence of a pleural effusion or pneumothorax. The visualized skeletal structures are unremarkable. IMPRESSION: Low lung volumes with mild bibasilar atelectasis, right greater than left. Electronically Signed   By: Virgina Norfolk M.D.   On: 03/11/2022 18:40  CUP PACEART INCLINIC DEVICE CHECK  Result Date: 03/11/2022 Pacemaker check in clinic. Normal device function. Unable to perform atrial and ventricular threshold d/t AFl w RVR. Sensing and impedances consistent with previous measurements. Device programmed to maximize longevity. AT/AF 52% burden.  Device programmed at appropriate safety margins. Ventricular histogram elevated. See OV note for further discussion. Device programmed to optimize intrinsic conduction. Estimated longevity 7.5 years. Patient enrolled in remote follow-up. Patient education completed.   EKG: Independently reviewed.   Assessment/Plan  Afib rvr  -s/p DCCV 03/03/21 -tachy-brady s/p PPM -admit to progressive care  -continue on amiodarone drip per cardiology recs -npo at midnight for planned cardioversion in am  -continue on Eliquis   CHFref -no acute exacerbation -patient is euvolemic on exam  - last ef 25 % on 1/24 - thought to be tachycardia induced  -no currently on out patient diuretics  -patient currently without fluid on xray  - will await further cardiology guidance re treatment   HTN -currently soft bp  -will hold BP medications overnight ,resume in am as able    Hypothyroidism -resume synthroid   CKD stage 4 -at around baseline     Renal CA of left kidney  Left renal pelvis ureter cancer Bladder CA -s/p  Left partial nephrectomy -s/pLeft complete ureterectomy (at time of cystectomy)  -Followed by [facility]   Urothelial  carcinoma in R kidney -followed by [facility]  -non con ct abd/pelvis was ordered outpatient for am at [facility] -consider ordering prior to patient d/c  -proposed option of s remaining R kidney and ureter with ileal conduit resection which would lead to HD but cure of history cancer. Final decision regarding this has not been made  DVT prophylaxis:Eliquis Code Status: full/ as discussed per patient wishes in event of cardiac arrest  Family Communication: none at bedside Disposition Plan:patient  expected to be admitted greater than 2 midnights  Consults called: cardiology DR Ciortu Admission status: progressive care   Clance Boll MD [facility]   If 7PM-7AM, please contact night-coverage [email or web address] Password [facility]  03/11/2022, 10:21 PM

## 2022-03-11 NOTE — ED Notes (Signed)
Ivs infiltrated. 2 attempts made. IV team consulted

## 2022-03-11 NOTE — ED Provider Triage Note (Signed)
Emergency Medicine Provider Triage Evaluation Note  Jordan Oconnell , a 82 y.o. male  was evaluated in triage.  Pt complains of shortness of breath. States he was sent by cardiologist here for evaluation. States he has been short of breath for about 1 week. He feels very fatigued. He had cardioversion 1 week ago for atrial fibrillation. He is currently denying chest pain and does not feel palpitations.   Review of Systems  Positive: See above Negative:   Physical Exam  BP 104/82 (BP Location: Right Arm)   Pulse (!) 119   Resp 20   SpO2 99%  Gen:   Awake, moderate distress Resp:  Tachypneic MSK:   Moves extremities without difficulty  Other:  Tachycardic, 1+ radial pulses.  Medical Decision Making  Medically screening exam initiated at 5:26 PM.  Appropriate orders placed.  Jordan Oconnell was informed that the remainder of the evaluation will be completed by another provider, this initial triage assessment does not replace that evaluation, and the importance of remaining in the ED until their evaluation is complete.  Expedited room - triage RN aware.    Mickie Hillier, PA-C 03/11/22 1733

## 2022-03-11 NOTE — Patient Instructions (Signed)
You have been recommended to go to the emergency department for further monitoring

## 2022-03-11 NOTE — ED Provider Notes (Cosign Needed Addendum)
Jordan Oconnell   CSN: [id number] Arrival date & time: 03/11/22  1656     History  Chief Complaint  Patient presents with   Shortness of Breath    Jordan Oconnell is a 82 y.o. male.  This is a 82 year old male with history of paroxysmal atrial fibrillation/flutter, tachybradycardia syndrome status post pacemaker, HFrEF, status post TAVR, hypertension, CKD stage IV, urothelial carcinoma and right kidney presenting to the ED for fatigue and shortness of breath.  Patient follows closely with cardiology, last seen today, underwent cardioversion on 2/7 with return to sinus rhythm.  States he is feeling very poorly, having fatigue and inability to perform daily activities.  He states he takes his Eliquis and does not miss any doses.  Denies any bleeding episodes.  He denies chest pain, abdominal pain, nausea or vomiting.       Home Medications Prior to Admission medications   Medication Sig Start Date End Date Taking? Authorizing Provider  ALPRAZolam (XANAX) 0.25 MG tablet Take 0.25 mg by mouth at bedtime.    [provider]  amiodarone (PACERONE) 400 MG tablet Take 1 tablet (400 mg total) by mouth daily. 03/01/22   Croitoru, Mihai, MD  azithromycin (ZITHROMAX) 500 MG tablet Take 1 tablet by mouth 1 hour before dental procedures and cleanings 02/06/21   Eileen Stanford, PA-C  ELIQUIS 2.5 MG TABS tablet Take 2.5 mg by mouth 2 (two) times daily. 01/28/22   [provider]  ferrous sulfate 325 (65 FE) MG EC tablet Take 1 tablet (325 mg total) by mouth in the morning and at bedtime. 03/02/22   Croitoru, Mihai, MD  latanoprost (XALATAN) 0.005 % ophthalmic solution Place 1 drop into both eyes at bedtime. 09/08/20   [provider]  levothyroxine (SYNTHROID) 50 MCG tablet Take 50 mcg by mouth at bedtime.    [provider]  metoprolol succinate (TOPROL-XL) 50 MG 24 hr tablet Take 1 tablet (50 mg total) by  mouth in the morning and at bedtime. Take with or immediately following a meal. Patient taking differently: Take 25 mg by mouth in the morning and at bedtime. Take with or immediately following a meal. 03/01/22   Croitoru, Mihai, MD  mirtazapine (REMERON) 15 MG tablet Take 7.5 mg by mouth at bedtime. 08/09/20   [provider]  Pembrolizumab (KEYTRUDA IV) Inject into the vein every 6 (six) weeks. Infusion    [provider]  sodium bicarbonate 650 MG tablet Take 1,300 mg by mouth 2 (two) times daily. 09/01/20   [provider]  hydrALAZINE (APRESOLINE) 50 MG tablet Take 50 mg by mouth 2 (two) times daily. Patient not taking: Reported on 03/01/2022  03/01/22  [provider]      Allergies    Amoxicillin, Doxycycline, and Flomax [tamsulosin hcl]    Review of Systems   Review of Systems  Constitutional:  Positive for fatigue.  Respiratory:  Positive for shortness of breath.   Cardiovascular:  Positive for palpitations. Negative for chest pain.  Gastrointestinal:  Negative for abdominal pain.  Neurological:  Positive for weakness. Negative for seizures and syncope.  Psychiatric/Behavioral:  Negative for confusion.      Physical Exam Updated Vital Signs BP 104/82 (BP Location: Right Arm)   Pulse (!) 119   Resp 20   SpO2 99%  Physical Exam Constitutional:      Appearance: He is ill-appearing.  HENT:     Head: Normocephalic.  Eyes:  Pupils: Pupils are equal, round, and reactive to light.  Cardiovascular:     Rate and Rhythm: Regular rhythm. Tachycardia present. No extrasystoles are present.    Heart sounds: No murmur heard.    No gallop.  Pulmonary:     Effort: Tachypnea present. No accessory muscle usage or respiratory distress.     Breath sounds: No decreased breath sounds, wheezing, rhonchi or rales.  Chest:     Chest wall: No mass.  Musculoskeletal:     Right lower leg: No edema.     Left lower leg: No edema.  Skin:    General: Skin is  warm.     Capillary Refill: Capillary refill takes less than 2 seconds.  Neurological:     General: No focal deficit present.     Mental Status: He is alert and oriented to person, place, and time.     ED Results / Procedures / Treatments   Labs (all labs ordered are listed, but only abnormal results are displayed) Labs Reviewed  BASIC METABOLIC PANEL  BRAIN NATRIURETIC PEPTIDE  CBC WITH DIFFERENTIAL/PLATELET  TROPONIN I (HIGH SENSITIVITY)    EKG None  Radiology CUP [place]  Result Date: 03/11/2022 Pacemaker check in clinic. Normal device function. Unable to perform atrial and ventricular threshold d/t AFl w RVR. Sensing and impedances consistent with previous measurements. Device programmed to maximize longevity. AT/AF 52% burden.  Device programmed at appropriate safety margins. Ventricular histogram elevated. See OV Oconnell for further discussion. Device programmed to optimize intrinsic conduction. Estimated longevity 7.5 years. Patient enrolled in remote follow-up. Patient education completed.   Procedures Procedures    Medications Ordered in ED Medications - No data to display  ED Course/ Medical Decision Making/ A&P                             Medical Decision Making Patient presents with history of recent cardioversion after atrial fibrillation, now presenting with similar symptoms as his symptoms did not improve after cardioversion.  Continues to have weakness with worsening shortness of breath that is affecting his ADLs.  I am concerned for acute heart failure exacerbation versus tachydysrhythmia, will also evaluate for ACS.  Serial troponins, EKG, CBC, CMP, magnesium, BNP, chest x-ray and EKG ordered.  I personally reviewed and interpreted patient's labs which is significant for slightly elevated creatinine of 3.08 from 2.8.  Initial troponin 152, repeat troponin 133, BNP 3602, hemoglobin with a mild anemia of 10.  I personally reviewed and  interpreted patient's EKG which shows a wide-complex tachycardia, unknown if this is monomorphic ventricular tachycardia versus atrial flutter with aberrancy.  Patient started on amiodarone 1 mg/min gtt.  Cardiology was then consulted for further recommendations.  I personally reviewed and interpreted patient's chest x-ray which shows AICD in the proper position, no focal consolidation.  Patient's case was further discussed with cardiology as we have a high degree of suspicion for acute heart failure exacerbation in the setting of tachydysrhythmia, plan to give patient amiodarone bolus of 150 mg, continue amiodarone gtt., and admit patient to their service for further workup.  Patient was stable upon admission to hospital.  Amount and/or Complexity of Data Reviewed External Data Reviewed: notes.    Details: Per chart review patient's pacemaker was checked in clinic earlier today, noted to have normal device function, AT/AF burden of 52%. Labs: ordered. Decision-making details documented in ED Course. Radiology: ordered and independent interpretation performed. Decision-making details  documented in ED Course. ECG/medicine tests: ordered and independent interpretation performed. Decision-making details documented in ED Course. Discussion of management or test interpretation with external provider(s): Cardiology  Risk Prescription drug management. Decision regarding hospitalization.         Final Clinical Impression(s) / ED Diagnoses Final diagnoses:  None    Rx / DC Orders ED Discharge Orders     None            Jimmie Molly, MD 03/12/22 [place]    Varney Biles, MD 03/16/22 1520

## 2022-03-11 NOTE — ED Triage Notes (Signed)
Pt came in POV d/t his heart rate being too high since he assumed it was from his SBP being "high" while checking it at home. He went to see his cardiologist & came here from their office d/t being in A-Fib. Pt also reports recently feeling lethargic & a pressure like feeling in his chest.

## 2022-03-12 ENCOUNTER — Inpatient Hospital Stay (HOSPITAL_COMMUNITY): Payer: Medicare PPO | Admitting: Anesthesiology

## 2022-03-12 ENCOUNTER — Inpatient Hospital Stay (HOSPITAL_COMMUNITY): Payer: Medicare PPO

## 2022-03-12 ENCOUNTER — Encounter (HOSPITAL_COMMUNITY)
Admission: EM | Disposition: A | Discharge status: Home Health | Payer: Self-pay | Source: Ambulatory Visit | Attending: Internal Medicine

## 2022-03-12 ENCOUNTER — Ambulatory Visit (HOSPITAL_COMMUNITY): Payer: Medicare PPO

## 2022-03-12 ENCOUNTER — Encounter (HOSPITAL_COMMUNITY): Payer: Self-pay | Admitting: Internal Medicine

## 2022-03-12 ENCOUNTER — Encounter (HOSPITAL_COMMUNITY): Payer: Self-pay

## 2022-03-12 DIAGNOSIS — D631 Anemia in chronic kidney disease: Secondary | ICD-10-CM | POA: Diagnosis not present

## 2022-03-12 DIAGNOSIS — I495 Sick sinus syndrome: Secondary | ICD-10-CM

## 2022-03-12 DIAGNOSIS — N189 Chronic kidney disease, unspecified: Secondary | ICD-10-CM | POA: Diagnosis not present

## 2022-03-12 DIAGNOSIS — I484 Atypical atrial flutter: Secondary | ICD-10-CM

## 2022-03-12 DIAGNOSIS — I255 Ischemic cardiomyopathy: Secondary | ICD-10-CM

## 2022-03-12 DIAGNOSIS — I129 Hypertensive chronic kidney disease with stage 1 through stage 4 chronic kidney disease, or unspecified chronic kidney disease: Secondary | ICD-10-CM | POA: Diagnosis not present

## 2022-03-12 DIAGNOSIS — I4891 Unspecified atrial fibrillation: Secondary | ICD-10-CM

## 2022-03-12 DIAGNOSIS — I4892 Unspecified atrial flutter: Secondary | ICD-10-CM

## 2022-03-12 HISTORY — PX: CARDIOVERSION: SHX1299

## 2022-03-12 LAB — CBC
HCT: 34.8 % — ABNORMAL LOW (ref 39.0–52.0)
Hemoglobin: 9.6 g/dL — ABNORMAL LOW (ref 13.0–17.0)
MCH: 25.5 pg — ABNORMAL LOW (ref 26.0–34.0)
MCHC: 27.6 g/dL — ABNORMAL LOW (ref 30.0–36.0)
MCV: 92.3 fL (ref 80.0–100.0)
Platelets: 98 10*3/uL — ABNORMAL LOW (ref 150–400)
RBC: 3.77 MIL/uL — ABNORMAL LOW (ref 4.22–5.81)
RDW: 21.1 % — ABNORMAL HIGH (ref 11.5–15.5)
WBC: 6.3 10*3/uL (ref 4.0–10.5)
nRBC: 0 % (ref 0.0–0.2)

## 2022-03-12 LAB — RESPIRATORY PANEL BY PCR

## 2022-03-12 LAB — COMPREHENSIVE METABOLIC PANEL
ALT: 16 U/L (ref 0–44)
AST: 17 U/L (ref 15–41)
Albumin: 3.3 g/dL — ABNORMAL LOW (ref 3.5–5.0)
Alkaline Phosphatase: 56 U/L (ref 38–126)
Anion gap: 12 (ref 5–15)
BUN: 69 mg/dL — ABNORMAL HIGH (ref 8–23)
CO2: 16 mmol/L — ABNORMAL LOW (ref 22–32)
Calcium: 8.8 mg/dL — ABNORMAL LOW (ref 8.9–10.3)
Chloride: 111 mmol/L (ref 98–111)
Creatinine, Ser: 3.06 mg/dL — ABNORMAL HIGH (ref 0.61–1.24)
GFR, Estimated: 20 mL/min — ABNORMAL LOW (ref >60)
Glucose, Bld: 107 mg/dL — ABNORMAL HIGH (ref 70–99)
Potassium: 4.7 mmol/L (ref 3.5–5.1)
Sodium: 139 mmol/L (ref 135–145)
Total Bilirubin: 1 mg/dL (ref 0.3–1.2)
Total Protein: 6 g/dL — ABNORMAL LOW (ref 6.5–8.1)

## 2022-03-12 LAB — PROTIME-INR
INR: 1.3 — ABNORMAL HIGH (ref 0.8–1.2)
Prothrombin Time: 16.3 seconds — ABNORMAL HIGH (ref 11.4–15.2)

## 2022-03-12 LAB — TSH: TSH: 4.609 u[IU]/mL — ABNORMAL HIGH (ref 0.350–4.500)

## 2022-03-12 LAB — HEMOGLOBIN A1C
Hgb A1c MFr Bld: 4.9 % (ref 4.8–5.6)
Mean Plasma Glucose: 93.93 mg/dL

## 2022-03-12 SURGERY — CARDIOVERSION
Anesthesia: General

## 2022-03-12 MED ORDER — PROPOFOL 10 MG/ML IV BOLUS
INTRAVENOUS | Status: DC | PRN
  Administered 2022-03-12: 40 mg via INTRAVENOUS
  Administered 2022-03-12: 20 mg via INTRAVENOUS

## 2022-03-12 MED ORDER — FUROSEMIDE 10 MG/ML IJ SOLN
40.0000 mg | Freq: Once | INTRAMUSCULAR | Status: AC
  Administered 2022-03-12: 40 mg via INTRAVENOUS
  Filled 2022-03-12: qty 4

## 2022-03-12 MED ORDER — SODIUM CHLORIDE 0.9 % IV SOLN: INTRAVENOUS | Status: DC

## 2022-03-12 MED ORDER — SODIUM CHLORIDE 0.9 % IV SOLN: INTRAVENOUS | Status: DC | PRN

## 2022-03-12 MED ORDER — ALUM & MAG HYDROXIDE-SIMETH 200-200-20 MG/5ML PO SUSP
30.0000 mL | ORAL | Status: DC | PRN
  Administered 2022-03-12: 30 mL via ORAL
  Filled 2022-03-12: qty 30

## 2022-03-12 NOTE — Anesthesia Preprocedure Evaluation (Addendum)
Anesthesia Evaluation  Patient identified by MRN, date of birth, ID band Patient awake    Reviewed: Allergy & Precautions, NPO status , Patient's Chart, lab work & pertinent test results  History of Anesthesia Complications Negative for: history of anesthetic complications  Airway Mallampati: IV  TM Distance: <3 FB Neck ROM: Limited    Dental  (+) Poor Dentition, Chipped, Dental Advisory Given   Pulmonary shortness of breath, neg COPD   breath sounds clear to auscultation       Cardiovascular hypertension, Pt. on medications + dysrhythmias Atrial Fibrillation + pacemaker  Rhythm:Irregular   1. Systolic dysfunction is new compared with the echo 01/2021.   2. Left ventricular ejection fraction, by estimation, is 20 to 25%. The  left ventricle has severely decreased function. The left ventricle  demonstrates regional wall motion abnormalities (see scoring  diagram/findings for description). There is mild  concentric left ventricular hypertrophy. Left ventricular diastolic  function could not be evaluated.   3. Right ventricular systolic function is mildly reduced. The right  ventricular size is normal. There is normal pulmonary artery systolic  pressure.   4. Left atrial size was severely dilated.   5. The mitral valve is normal in structure. Mild mitral valve  regurgitation. No evidence of mitral stenosis.   6. The aortic valve has been repaired/replaced. Aortic valve  regurgitation is not visualized. No aortic stenosis is present. There is a  29 mm Sapien prosthetic (TAVR) valve present in the aortic position.  Aortic valve mean gradient measures 5.8 mmHg.  Aortic valve Vmax measures 1.58 m/s.   7. Aortic dilatation noted. There is moderate dilatation of the ascending  aorta, measuring 43 mm.   8. The inferior vena cava is normal in size with greater than 50%  respiratory variability, suggesting right atrial pressure of 3 mmHg.        Neuro/Psych negative neurological ROS  negative psych ROS   GI/Hepatic Neg liver ROS,GERD  ,,  Endo/Other  negative endocrine ROS    Renal/GU CRFRenal diseaseLab Results      Component                Value               Date                      CREATININE               3.06 (H)            03/12/2022            Lab Results      Component                Value               Date                      K                        4.7                 03/12/2022                Musculoskeletal negative musculoskeletal ROS (+)    Abdominal   Peds  Hematology  (+) Blood dyscrasia, anemia Lab Results      Component  Value               Date                      WBC                      7.0                 03/11/2022                HGB                      10.0 (L)            03/11/2022                HCT                      34.4 (L)            03/11/2022                MCV                      88.0                03/11/2022                PLT                      109 (L)             03/11/2022               Anesthesia Other Findings   Reproductive/Obstetrics                             Anesthesia Physical Anesthesia Plan  ASA: 3  Anesthesia Plan: General   Post-op Pain Management: Minimal or no pain anticipated   Induction: Intravenous  PONV Risk Score and Plan: 2 and Treatment may vary due to age or medical condition  Airway Management Planned: Mask, Natural Airway and Nasal Cannula  Additional Equipment: None  Intra-op Plan:   Post-operative Plan:   Informed Consent: I have reviewed the patients History and Physical, chart, labs and discussed the procedure including the risks, benefits and alternatives for the proposed anesthesia with the patient or authorized representative who has indicated his/her understanding and acceptance.     Dental advisory given  Plan Discussed with: CRNA  Anesthesia Plan Comments:         Anesthesia Quick Evaluation

## 2022-03-12 NOTE — ED Notes (Signed)
Pt report received from previous nurse. Pt A&O x4, vitals stable, denies needs/complaints. Call bell in reach. No acute distress noted.

## 2022-03-12 NOTE — Progress Notes (Signed)
Rounding Note    Patient Name: Jordan Oconnell Date of Encounter: 03/12/2022  [place] Cardiologist: Sanda Klein, MD   Subjective   He is dyspneic and tachypneic, but is able to lie almost fully supine in bed. Remains in atrial flutter with 2: 1 AV block with ventricular rate of 120-130 bpm. Device interrogation today confirms atrial flutter with a cycle length of approximately 280 ms.  Multiple attempts at overdrive pacing were unsuccessful, ultimately resulting in deterioration to atrial fibrillation with rapid ventricular response, but the atrial flutter rhythm reorganized shortly thereafter. Has been compliant with apixaban anticoagulation, without interruption.  No recent bleeding problems.  Inpatient Medications    Scheduled Meds:  ALPRAZolam  0.25 mg Oral QHS   apixaban  2.5 mg Oral BID   ferrous sulfate  325 mg Oral Q breakfast   latanoprost  1 drop Both Eyes QHS   levothyroxine  50 mcg Oral QHS   sodium bicarbonate  1,300 mg Oral BID   Continuous Infusions:  amiodarone 30 mg/hr (03/12/22 0203)   PRN Meds: levalbuterol, ondansetron **OR** ondansetron (ZOFRAN) IV   Vital Signs    Vitals:   03/12/22 0430 03/12/22 0500 03/12/22 0505 03/12/22 0650  BP: 103/79 (!) 114/90  (!) 111/92  Pulse: (!) 109 (!) 108  (!) 109  Resp: 14 17  20  $ Temp:   98 F (36.7 C) 98 F (36.7 C)  TempSrc:   Oral Oral  SpO2: 100% 93%  100%    Intake/Output Summary (Last 24 hours) at 03/12/2022 0801 Last data filed at 03/12/2022 [id number] Gross per 24 hour  Intake 139.84 ml  Output 525 ml  Net -385.16 ml      03/11/2022    2:41 PM 03/03/2022   11:53 AM 03/01/2022    8:16 AM  Last 3 Weights  Weight (lbs) 183 lb 188 lb 188 lb  Weight (kg) 83.008 kg 85.276 kg 85.276 kg      Telemetry    Atrial flutter with 2: 1 AV block, ventricular rate 120-130 bpm- Personally Reviewed  ECG    Atrial flutter with 2: 1 AV block, left bundle branch block, very broad QRS of 170 ms-  Personally Reviewed  Physical Exam  Tachypneic, but able to speak in complete sentences. GEN: No acute distress.   Neck: No JVD Cardiac: RRR, tachycardic, faint aortic ejection murmur, no diastolic murmurs, rubs, or gallops.  Respiratory: Clear to auscultation bilaterally. GI: Soft, nontender, non-distended  MS: No edema; No deformity. Neuro:  Nonfocal  Psych: Normal affect   Labs    High Sensitivity Troponin:   Recent Labs  Lab 03/11/22 1811 03/11/22 2023  TROPONINIHS 152* 133*     Chemistry Recent Labs  Lab 03/11/22 1811 03/11/22 2023  NA 138  --   K 4.8  --   CL 108  --   CO2 17*  --   GLUCOSE 93  --   BUN 68*  --   CREATININE 3.08*  --   CALCIUM 9.3  --   MG  --  2.5*  GFRNONAA 19*  --   ANIONGAP 13  --     Lipids No results for input(s): "CHOL", "TRIG", "HDL", "LABVLDL", "LDLCALC", "CHOLHDL" in the last 168 hours.  Hematology Recent Labs  Lab 03/11/22 1811  WBC 7.0  RBC 3.91*  HGB 10.0*  HCT 34.4*  MCV 88.0  MCH 25.6*  MCHC 29.1*  RDW 20.8*  PLT 109*   Thyroid  Recent Labs  Lab 03/12/22 0032  TSH 4.609*    BNP Recent Labs  Lab 03/11/22 1811  BNP 3,602.4*    DDimer No results for input(s): "DDIMER" in the last 168 hours.   Radiology    CT CHEST ABDOMEN PELVIS WO CONTRAST  Result Date: 03/12/2022 CLINICAL DATA:  Metastatic disease evaluation. Right urothelial cancer and left renal cell cancer. EXAM: CT CHEST, ABDOMEN AND PELVIS WITHOUT CONTRAST TECHNIQUE: Multidetector CT imaging of the chest, abdomen and pelvis was performed following the standard protocol without IV contrast. RADIATION DOSE REDUCTION: This exam was performed according to the departmental dose-optimization program which includes automated exposure control, adjustment of the mA and/or kV according to patient size and/or use of iterative reconstruction technique. COMPARISON:  11/20/2020 FINDINGS: Absence of IV contrast material limits evaluation of solid organs and vascular  structures. CT CHEST FINDINGS Cardiovascular: Cardiac enlargement. Aortic valve prosthesis. No pericardial effusions. Cardiac pacemaker. Normal caliber thoracic aorta. Scattered aortic calcification. Mediastinum/Nodes: Scattered mediastinal lymph nodes are present. Largest are in the subcarinal region measuring 1.2 cm short axis dimension. No change since prior study. Thyroid gland is unremarkable. Esophagus is decompressed. Lungs/Pleura: Small bilateral pleural effusions. Motion artifact limits evaluation of the lungs. There is atelectasis in the lung bases, likely compressive. Interlobular septal thickening in the lung apices may indicate fibrosis or edema. No definitive lung nodules. Musculoskeletal: Degenerative changes in the spine. Old healed fracture deformity of the right clavicle. Postoperative changes in the right shoulder. No destructive bone lesions. CT ABDOMEN PELVIS FINDINGS Hepatobiliary: Scattered subcentimeter low-attenuation lesions as seen on prior study, likely cysts. Cholelithiasis with several stones in the gallbladder. No inflammatory changes. No bile duct dilatation. Pancreas: Unremarkable. No pancreatic ductal dilatation or surrounding inflammatory changes. Spleen: Normal in size without focal abnormality. Adrenals/Urinary Tract: No adrenal gland nodules. Surgical absence of the left kidney. There is infiltration around the right kidney and proximal right ureter with mild prominence of the extrarenal pelvis. This area corresponds to a mass lesion better seen with contrast-enhanced imaging on the prior study. Due to differences in technique, it is difficult to compare the 2 studies but the area of the lesion looks larger than on the prior study. Bladder is surgically absent. Right lower quadrant ileal conduit is decompressed. Stomach/Bowel: Stomach, small bowel, and colon are not abnormally distended. Postoperative changes suggesting partial colectomy with ileocolonic anastomosis. Scattered  diverticula in the colon without any evidence of acute diverticulitis. Large ventral abdominal wall hernia containing multiple small and large bowel loops as well as omentum and mesentery. No proximal obstruction. Vascular/Lymphatic: Diffuse aortic calcification. No abdominal aortic aneurysm. Distal right iliac artery aneurysm measuring 2.4 cm. No significant lymphadenopathy. Reproductive: Prostate gland appears surgically absent. Other: No free air or free fluid in the abdomen. Musculoskeletal: Degenerative changes in the spine. No destructive bone lesions. IMPRESSION: 1. Lack of IV contrast material limits the examination. 2. Small bilateral pleural effusions with basilar atelectasis. No discrete pulmonary nodules are identified. 3. Mediastinal lymphadenopathy measuring up to 1.2 cm. No change since prior study. Nonspecific etiology. 4. Cholelithiasis without evidence of acute cholecystitis. 5. Enlargement of the area of the right renal pelvis with surrounding stranding. This corresponds to an enhancing tumor seen on the prior study although less well demonstrated today without IV contrast material. Correlation is limited due to lack of IV contrast material today but this area may be enlarged. There is surrounding stranding which was not present previously. 6. Surgical absence of the left kidney, bladder, and prostate gland. Right lower quadrant  ileal conduit. 7. Large broad-based anterior abdominal wall hernia containing small and large bowel as well as mesentery and omentum. No proximal obstruction. 8. Aortic atherosclerosis. 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 Electronically Signed   By: Lucienne Capers M.D.   On: 03/12/2022 00:33   DG Chest Port 1 View  Result Date: 03/11/2022 CLINICAL DATA:  Shortness of breath. EXAM: PORTABLE CHEST 1 VIEW COMPARISON:  February 13, 2021 FINDINGS: Stable dual lead AICD positioning is seen. The cardiac silhouette is mildly enlarged and unchanged in size. An  artificial aortic valve is noted. Low lung volumes are seen with mild areas of atelectasis within the bilateral lung bases, right greater than left. There is no evidence of a pleural effusion or pneumothorax. The visualized skeletal structures are unremarkable. IMPRESSION: Low lung volumes with mild bibasilar atelectasis, right greater than left. Electronically Signed   By: Virgina Norfolk M.D.   On: 03/11/2022 18:40   CUP PACEART INCLINIC DEVICE CHECK  Result Date: 03/11/2022 Pacemaker check in clinic. Normal device function. Unable to perform atrial and ventricular threshold d/t AFl w RVR. Sensing and impedances consistent with previous measurements. Device programmed to maximize longevity. AT/AF 52% burden.  Device programmed at appropriate safety margins. Ventricular histogram elevated. See OV note for further discussion. Device programmed to optimize intrinsic conduction. Estimated longevity 7.5 years. Patient enrolled in remote follow-up. Patient education completed.   Cardiac Studies    Relevant CV Studies: TTE 02/19/22  1. Systolic dysfunction is new compared with the echo 01/2021.   2. Left ventricular ejection fraction, by estimation, is 20 to 25%. The left ventricle has severely decreased function. The left ventricle demonstrates regional wall motion abnormalities (see scoring diagram/findings for description). There is mild  concentric left ventricular hypertrophy. Left ventricular diastolic function could not be evaluated.   3. Right ventricular systolic function is mildly reduced. The right ventricular size is normal. There is normal pulmonary artery systolic pressure.   4. Left atrial size was severely dilated.   5. The mitral valve is normal in structure. Mild mitral valve regurgitation. No evidence of mitral stenosis.   6. The aortic valve has been repaired/replaced. Aortic valve regurgitation is not visualized. No aortic stenosis is present. There is a 29 mm Sapien prosthetic  (TAVR) valve present in the aortic position. Aortic valve mean gradient measures 5.8 mmHg. Aortic valve Vmax measures 1.58 m/s.   7. Aortic dilatation noted. There is moderate dilatation of the ascending aorta, measuring 43 mm.   8. The inferior vena cava is normal in size with greater than 50% respiratory variability, suggesting right atrial pressure of 3 mmHg.    TTE 02/20/21  1. Abnormal septal motion . Left ventricular ejection fraction, by estimation, is 55%. The left ventricle has normal function. The left ventricle has no regional wall motion abnormalities. Left ventricular diastolic parameters were normal.   2. Pacing wires in RA/RV. Right ventricular systolic function is normal. The right ventricular size is normal.   3. Left atrial size was moderately dilated.   4. The mitral valve is abnormal. Mild mitral valve regurgitation. No evidence of mitral stenosis.   5. Post TAVR with 29 mm Sapien 3 valve no significant PVL gradients have increased since previous echo . The aortic valve has been repaired/replaced. Aortic valve regurgitation is not visualized. No aortic stenosis is present.   6. Aortic dilatation noted. There is moderate dilatation of the ascending aorta, measuring 43 mm.   7. The inferior vena cava is normal in  size with greater than 50% respiratory variability, suggesting right atrial pressure of 3 mmHg.     Patient Profile     82 y.o. 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  Had successful DC cardioversion 03/03/2022, with early recurrence of arrhythmia despite recent initiation of oral amiodarone on 03/01/2022.  Assessment & Plan    Atrial flutter, atypical: Remains very tachycardic, in heart failure.  He has always been oblivious to palpitations.  In the past year, he had a relatively high burden of atrial fibrillation around 30%, but this was paroxysmal and was adequately rate controlled.  Since he has been in this persistent flutter, the arrhythmia has been very difficult to rate control.  Telemetry and pacemaker interrogation shows atrial flutter with atrial cycle length of about 280 ms.  ECG shows native left bundle branch block morphology with a very broad QRS of 170 ms.  Has been on IV amiodarone overnight.  He has been fully anticoagulated for the last 6 weeks, without interruption.  Attempted overdrive pacing of the atrial flutter with burst pacing at cycle length of 240-230-220-210-200 ms, with evidence of engagement of the arrhythmia but without termination, eventually leading to deterioration to atrial fibrillation with rapid ventricular response.  After a few minutes, atrial flutter reorganized, with 2: 1 AV block and ventricular rate of about 130 bpm.  Scheduled for another attempt at DC cardioversion today at 09 30h. This procedure has been fully reviewed with the patient and written informed consent has been obtained.   2.  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  If we are successful in restoration of normal rhythm, this will hopefully lead to improved left ventricular systolic function.  He would also benefit from reprogramming his device to committed atrial sensed-ventricular paced rhythm since the QRS complex during ventricular pacing via  his RV left bundle area lead is actually narrower than the native QRS (roughly 140 ms versus 170 ms).  Hopefully with restoration of normal rhythm, resolution of tachycardia cardiomyopathy and some degree of resynchronization through left bundle area pacing we will gradually improve his heart failure. Not a good candidate for many guideline directed oncological therapies for heart failure due to advanced chronic kidney disease. 3.  PPM: Normal device function.  Plan to reprogram to DDDR mode with short AV delay for purposeful ventricular pacing, after successful cardioversion.  Have discussed with Dr. [name]. 4.  Iron Deficiency anemia: A recurrent problem.  Has had hematuria in the past, not recently.  Just started on iron supplements at the beginning of February.  No evidence of active bleeding.  We have discussed Watchman device implantation, but need to get him beyond the current episode of heart failure first. 5.  CKD4: Due to surgical right nephrectomy partial left nephrectomy for urothelial cancer.  Baseline creatinine is 2.7-3.0.  Has a urostomy.  No active hematuria.  Urologist at [facility] is Dr. [name], nephrologist is Dr. [name].  He takes supplements  of sodium bicarbonate for chronic hyperchloremic metabolic acidosis. 6.  Anticoagulation: Eliquis dose adjusted for age and renal dysfunction.  Has been on uninterrupted anticoagulation since January 4.  So far he has tolerated 6 weeks of anticoagulation without active bleeding, although he has developed mild iron deficiency.  I think if we can get the heart failure taken care of, he would be a reasonable candidate for a Watchman device. 7.  TAVR: Normal prosthetic valve function by recent echo. 8.  CAD: He has never had angina.  The only severe stenosis by cardiac catheterization pre-TAVR September 2022 was in a first diagonal artery.  Has never required revascularization.  Previously on flecainide, this medication was discontinued when coronary  disease was diagnosed.   Shared Decision Making/Informed Consent The risks (stroke, cardiac arrhythmias rarely resulting in the need for a temporary or permanent pacemaker, skin irritation or burns and complications associated with conscious sedation including aspiration, arrhythmia, respiratory failure and death), benefits (restoration of normal sinus rhythm) and alternatives of a direct current cardioversion were explained in detail to Jordan Oconnell and he agrees to proceed.        For questions or updates, please contact [place] Please consult [email or web address] for contact info under        Signed, Sanda Klein, MD  03/12/2022, 8:01 AM

## 2022-03-12 NOTE — ED Provider Notes (Incomplete)
[facility] Provider Note   CSN: [id number] Arrival date & time: 03/11/22  1656     History {Add pertinent medical, surgical, social history, OB history to HPI:1} Chief Complaint  Patient presents with  . Shortness of Breath    Jordan Oconnell is a 82 y.o. male.  This is a 82 year old male with history of paroxysmal atrial fibrillation/flutter, tachybradycardia syndrome status post pacemaker, HFrEF, status post TAVR, hypertension, CKD stage IV, urothelial carcinoma and right kidney presenting to the ED for fatigue and shortness of breath.  Patient follows closely with cardiology, last seen today, underwent cardioversion on 2/7 with return to sinus rhythm.  States he is feeling very poorly, having fatigue and inability to perform daily activities.  He states he takes his Eliquis and does not miss any doses.  Denies any bleeding episodes.  He denies chest pain, abdominal pain, nausea or vomiting.       Home Medications Prior to Admission medications   Medication Sig Start Date End Date Taking? Authorizing Provider  ALPRAZolam (XANAX) 0.25 MG tablet Take 0.25 mg by mouth at bedtime.    [provider]  amiodarone (PACERONE) 400 MG tablet Take 1 tablet (400 mg total) by mouth daily. 03/01/22   Croitoru, Mihai, MD  azithromycin (ZITHROMAX) 500 MG tablet Take 1 tablet by mouth 1 hour before dental procedures and cleanings 02/06/21   Eileen Stanford, PA-C  ELIQUIS 2.5 MG TABS tablet Take 2.5 mg by mouth 2 (two) times daily. 01/28/22   [provider]  ferrous sulfate 325 (65 FE) MG EC tablet Take 1 tablet (325 mg total) by mouth in the morning and at bedtime. 03/02/22   Croitoru, Mihai, MD  latanoprost (XALATAN) 0.005 % ophthalmic solution Place 1 drop into both eyes at bedtime. 09/08/20   [provider]  levothyroxine (SYNTHROID) 50 MCG tablet Take 50 mcg by mouth at bedtime.    [provider]  metoprolol  succinate (TOPROL-XL) 50 MG 24 hr tablet Take 1 tablet (50 mg total) by mouth in the morning and at bedtime. Take with or immediately following a meal. Patient taking differently: Take 25 mg by mouth in the morning and at bedtime. Take with or immediately following a meal. 03/01/22   Croitoru, Mihai, MD  mirtazapine (REMERON) 15 MG tablet Take 7.5 mg by mouth at bedtime. 08/09/20   [provider]  Pembrolizumab (KEYTRUDA IV) Inject into the vein every 6 (six) weeks. Infusion    [provider]  sodium bicarbonate 650 MG tablet Take 1,300 mg by mouth 2 (two) times daily. 09/01/20   [provider]  hydrALAZINE (APRESOLINE) 50 MG tablet Take 50 mg by mouth 2 (two) times daily. Patient not taking: Reported on 03/01/2022  03/01/22  [provider]      Allergies    Amoxicillin, Doxycycline, and Flomax [tamsulosin hcl]    Review of Systems   Review of Systems  Constitutional:  Positive for fatigue.  Respiratory:  Positive for shortness of breath.   Cardiovascular:  Positive for palpitations. Negative for chest pain.  Gastrointestinal:  Negative for abdominal pain.  Neurological:  Positive for weakness. Negative for seizures and syncope.  Psychiatric/Behavioral:  Negative for confusion.      Physical Exam Updated Vital Signs BP 104/82 (BP Location: Right Arm)   Pulse (!) 119   Resp 20   SpO2 99%  Physical Exam Constitutional:      Appearance: He is ill-appearing.  HENT:  Head: Normocephalic.  Eyes:     Pupils: Pupils are equal, round, and reactive to light.  Cardiovascular:     Rate and Rhythm: Regular rhythm. Tachycardia present. No extrasystoles are present.    Heart sounds: No murmur heard.    No gallop.  Pulmonary:     Effort: Tachypnea present. No accessory muscle usage or respiratory distress.     Breath sounds: No decreased breath sounds, wheezing, rhonchi or rales.  Chest:     Chest wall: No mass.  Musculoskeletal:     Right lower leg: No  edema.     Left lower leg: No edema.  Skin:    General: Skin is warm.     Capillary Refill: Capillary refill takes less than 2 seconds.  Neurological:     General: No focal deficit present.     Mental Status: He is alert and oriented to person, place, and time.     ED Results / Procedures / Treatments   Labs (all labs ordered are listed, but only abnormal results are displayed) Labs Reviewed  BASIC METABOLIC PANEL  BRAIN NATRIURETIC PEPTIDE  CBC WITH DIFFERENTIAL/PLATELET  TROPONIN I (HIGH SENSITIVITY)    EKG None  Radiology [facility] [place]  Result Date: 03/11/2022 Pacemaker check in clinic. Normal device function. Unable to perform atrial and ventricular threshold d/t AFl w RVR. Sensing and impedances consistent with previous measurements. Device programmed to maximize longevity. AT/AF 52% burden.  Device programmed at appropriate safety margins. Ventricular histogram elevated. See OV note for further discussion. Device programmed to optimize intrinsic conduction. Estimated longevity 7.5 years. Patient enrolled in remote follow-up. Patient education completed.   Procedures Procedures  {Document cardiac monitor, telemetry assessment procedure when appropriate:1}  Medications Ordered in ED Medications - No data to display  ED Course/ Medical Decision Making/ A&P   {   Click here for ABCD2, HEART and other calculatorsREFRESH Note before signing :1}                          Medical Decision Making Amount and/or Complexity of Data Reviewed Labs: ordered.  Risk Prescription drug management. Decision regarding hospitalization.   ***  {Document critical care time when appropriate:1} {Document review of labs and clinical decision tools ie heart score, Chads2Vasc2 etc:1}  {Document your independent review of radiology images, and any outside records:1} {Document your discussion with family members, caretakers, and with consultants:1} {Document social  determinants of health affecting pt's care:1} {Document your decision making why or why not admission, treatments were needed:1} Final Clinical Impression(s) / ED Diagnoses Final diagnoses:  None    Rx / DC Orders ED Discharge Orders     None

## 2022-03-12 NOTE — CV Procedure (Signed)
   Electrical Cardioversion Procedure Note Jordan Oconnell [id number] 04/22/1940  Procedure: Electrical Cardioversion Indications:  Atrial Fibrillation  Time Out: Verified patient identification, verified procedure,medications/allergies/relevent history reviewed, required imaging and test results available.  Performed  Procedure Details  The patient signed informed consent.   The patient was NPO past midnight. Has had therapeutic anticoagulation with apixiban greater than 3 weeks. The patient denies any interruption of anticoagulation.  Anesthesia was administered by Dr. Ermalene Postin.  Adequate airway was maintained throughout and vital followed per protocol.  He was cardioverted x 1 with 200J of biphasic synchronized energy.  He converted to NSR.  There were no apparent complications.  The patient tolerated the procedure well and had normal neuro status and respiratory status post procedure with vitals stable as recorded elsewhere.     IMPRESSION:  Successful cardioversion of atrial fibrillation   Follow up:  Will transfer back to medical floors.  He will continue on current medical therapy.  The patient advised to continue anticoagulation.  Jordan Oconnell 03/12/2022, 9:42 AM

## 2022-03-12 NOTE — Consult Note (Signed)
Cardiology Consultation:   Patient ID: Jordan Oconnell MRN: [id number]; DOB: 01-Jan-1941  Admit date: 03/11/2022 Date of Consult: 03/12/2022  Primary Care Provider: Burnard Bunting, MD [facility] Cardiologist: Sanda Klein, MD  [facility] Electrophysiologist:  Vickie Epley, MD    Patient Profile:   Jordan Oconnell is a 82 y.o. male with a hx of AS s/p TAVR ([place] 3 69m, 01/25/21), moderate aortic root aneurysm (448mby CT in October 2022), tachy-brady syndrome s/p dual chamber pacemaker ([place], 02/12/21), urothelial cancer involving bladder, ureters, and renal pelvises bilaterally, RCC (s/p partial L nephrectomy, complete cystectomy with urostomy), CKD4, NICM (LVEF 25%, thought to be tachy-induced), and paroxysmal atrial fibrillation / atrial flutter who is being seen today for the evaluation of recurrent atrial flutter at the request of ED.  History of Present Illness:   Jordan Oconnell that he has experienced "steadily worsening fatigue and shortness of breath for the past 8 weeks." He states that he was previously able to walk several miles every day for exercise, but now is only asymptomatic at rest. He underwent recent DCCV on 03/03/22 for atrial fibrillation/flutter with successful conversion to NSR. On 2/12, he called cardiology clinic stating that his fatigue had worsened and his HR was quite labile, ranging anywhere from 80-130 with BP checks. He was [facility] on 2/15 for reassessment. At this visit, device interrogation confirmed atrial flutter with RVR. Referred to ED for further management.   On arrival to ED, [name] afebrile and hemodynamically stable with BP 110-120/70-80, HR 100-120, oxygen saturation 98-100% RA. Initial evaluation was notable for the following: - CBC with stable anemia - hsTnT 152 --> 133 - BNP 3602 - CT C/A/P noncon showing small BiL plerual effusions,  stranding in region of R renal pelvis, large anterior wall hernia, aortic atherosclerosis, mediastinal LAD. - Bedside device interrogation in ED confirms persistent 2:1 atrial flutter.   Patient initiated on IV amiodarone and cardiology consulted for further management.    Past Medical History:  Diagnosis Date   Bladder cancer ([facility]   BPH (benign prostatic hyperplasia)    CKD (chronic kidney disease), stage IV (HCC)    Colon polyps    Diverticulosis    GERD (gastroesophageal reflux disease)    Hyperlipidemia    pt denies   Hypertension    PAF (paroxysmal atrial fibrillation) ([place]   on Flecanide and diltiazem. No OAC given recurrent hematuria   [place] [place]   left   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 CoBurt Knacknd Dr. BaCyndia Bent Severe aortic stenosis 10/05/2020    Past Surgical History:  Procedure Laterality Date   APPENDECTOMY     BLADDER SURGERY  2008   transurethral resection/resection of prostatic urethra   BOWEL RESECTION     CARDIOVERSION N/A 03/03/2022   Procedure: CARDIOVERSION;  Surgeon: ChBuford DresserMD;  [facility];  Service: Cardiovascular;  Laterality: N/A;   ESOPHAGOGASTRODUODENOSCOPY N/A 07/24/2013   Procedure: ESOPHAGOGASTRODUODENOSCOPY (EGD);  Surgeon: JoIrene ShipperMD;  [facility];  Service: Endoscopy;  Laterality: N/A;   INTRAOPERATIVE TRANSTHORACIC ECHOCARDIOGRAM N/A 01/27/2021   Procedure: INTRAOPERATIVE TRANSTHORACIC ECHOCARDIOGRAM;  Surgeon: CoSherren MochaMD;  [facility];  Service: Open Heart Surgery;  Laterality: N/A;   laparoscopic surgery  2012   laser   (?)  NEPHRECTOMY  2007   partial, left   NEPHROSTOMY  2011   stent   NM MYOCAR PERF WALL MOTION  09/08/2010   Normal   PACEMAKER IMPLANT N/A 02/12/2021   Procedure: PACEMAKER IMPLANT;  Surgeon: Vickie Epley, MD;  Location: [place]  CV LAB;  Service: Cardiovascular;  Laterality: N/A;   RIGHT HEART CATH AND CORONARY ANGIOGRAPHY N/A 10/06/2020   Procedure: RIGHT HEART CATH AND CORONARY ANGIOGRAPHY;  Surgeon: Sherren Mocha, MD;  Location: [place] [facility] LAB;  Service: Cardiovascular;  Laterality: N/A;   ROBOT ASSISTED LAPAROSCOPIC COMPLETE CYSTECT ILEAL CONDUIT     TRANSCATHETER AORTIC VALVE REPLACEMENT, TRANSFEMORAL Right 01/27/2021   Procedure: TRANSCATHETER AORTIC VALVE REPLACEMENT, TRANSFEMORAL;  Surgeon: Sherren Mocha, MD;  Location: [place] [facility] LAB;  Service: Open Heart Surgery;  Laterality: Right;   US ECHOCARDIOGRAPHY  0000000   mild diastolic dysfunction,mild dilated LA,mild MR,AI,mildly dilated aortic root     Home Medications:  Prior to Admission medications   Medication Sig Start Date End Date Taking? Authorizing Provider  ALPRAZolam (XANAX) 0.25 MG tablet Take 0.25 mg by mouth at bedtime.   Yes [provider]  amiodarone (PACERONE) 400 MG tablet Take 1 tablet (400 mg total) by mouth daily. 03/01/22  Yes Croitoru, Mihai, MD  azithromycin (ZITHROMAX) 500 MG tablet Take 1 tablet by mouth 1 hour before dental procedures and cleanings 02/06/21  Yes Eileen Stanford, PA-C  ELIQUIS 2.5 MG TABS tablet Take 2.5 mg by mouth 2 (two) times daily. 01/28/22  Yes [provider]  ferrous sulfate 325 (65 FE) MG EC tablet Take 1 tablet (325 mg total) by mouth in the morning and at bedtime. 03/02/22  Yes Croitoru, Mihai, MD  latanoprost (XALATAN) 0.005 % ophthalmic solution Place 1 drop into both eyes at bedtime. 09/08/20  Yes [provider]  levothyroxine (SYNTHROID) 50 MCG tablet Take 50 mcg by mouth at bedtime.   Yes [provider]  metoprolol succinate (TOPROL-XL) 50 MG 24 hr tablet Take 1 tablet (50 mg total) by mouth in the morning and at bedtime. Take with or immediately following a meal. Patient taking differently: Take 25 mg by mouth in the morning and at bedtime. Take with or  immediately following a meal. 03/01/22  Yes Croitoru, Mihai, MD  mirtazapine (REMERON) 15 MG tablet Take 7.5 mg by mouth at bedtime. 08/09/20  Yes [provider]  Pembrolizumab (KEYTRUDA IV) Inject into the vein every 6 (six) weeks. Infusion   Yes [provider]  sodium bicarbonate 650 MG tablet Take 1,300 mg by mouth 2 (two) times daily. 09/01/20  Yes [provider]  hydrALAZINE (APRESOLINE) 50 MG tablet Take 50 mg by mouth 2 (two) times daily. Patient not taking: Reported on 03/01/2022  03/01/22  [provider]    Inpatient Medications: Scheduled Meds:  ALPRAZolam  0.25 mg Oral QHS   apixaban  2.5 mg Oral BID   ferrous sulfate  325 mg Oral Q breakfast   latanoprost  1 drop Both Eyes QHS   levothyroxine  50 mcg Oral QHS   sodium bicarbonate  1,300 mg Oral BID   Continuous Infusions:  amiodarone 60 mg/hr (03/11/22 2153)   amiodarone     PRN Meds: levalbuterol, ondansetron **OR** ondansetron (ZOFRAN) IV  Allergies:    Allergies  Allergen Reactions   Amoxicillin Other (See Comments)    Acute interstitial nephritis   Doxycycline Hives   Flomax [Tamsulosin Hcl] Other (See Comments)    Dizzy  Social History:   Social History   Socioeconomic History   Marital status: Divorced    Spouse name: Not on file   Number of children: 2   Years of education: Not on file   Highest education level: Not on file  Occupational History   Occupation: Pharmacist, hospital  Tobacco Use   Smoking status: Never   Smokeless tobacco: Never  Vaping Use   Vaping Use: Never used  Substance and Sexual Activity   Alcohol use: Yes    Comment: occasionally   Drug use: No   Sexual activity: Not on file  Other Topics Concern   Not on file  Social History Narrative   Not on file   Social Determinants of Health   Financial Resource Strain: Not on file  Food Insecurity: Not on file  Transportation Needs: Not on file  Physical Activity: Not on file  Stress: Not on file   Social Connections: Not on file  Intimate Partner Violence: Not on file    Family History:   Family History  Problem Relation Age of Onset   Ovarian cancer Mother    Colon cancer Mother    Colon cancer Father    Crohn's disease Son    Breast cancer Daughter    Colon cancer Brother     Physical Exam/Data:   Vitals:   03/12/22 0035 03/12/22 0038 03/12/22 0045 03/12/22 0100  BP: 95/70 97/73 99/74 $ 97/78  Pulse: (!) 109 (!) 113 (!) 110 (!) 107  Resp:    (!) 22  Temp:      TempSrc:      SpO2: 96% 98% 92% 97%   No intake or output data in the 24 hours ending 03/12/22 0142    03/11/2022    2:41 PM 03/03/2022   11:53 AM 03/01/2022    8:16 AM  Last 3 Weights  Weight (lbs) 183 lb 188 lb 188 lb  Weight (kg) 83.008 kg 85.276 kg 85.276 kg     There is no height or weight on file to calculate BMI.  General:  Frail, tired appearing.  Neck: JVP 8cm H2O Endocrine:  No thryomegaly Vascular: No carotid bruits; FA pulses 2+ bilaterally without bruits  Cardiac:  tachycardic, regular, audible S1/S2 Lungs:  bibasilar crackles Abd: soft, nontender, nondistended Ext: no edema Musculoskeletal:  No deformities, BUE and BLE strength normal and equal Skin: warm and dry  Neuro:  CNs 2-12 intact, no focal abnormalities noted Psych:  Normal affect   EKG:  The EKG was personally reviewed and demonstrates:  atrial flutter with rapid ventricular response, 2:1 conduction.  Relevant CV Studies: TTE 02/19/22  1. Systolic dysfunction is new compared with the echo 01/2021.   2. Left ventricular ejection fraction, by estimation, is 20 to 25%. The left ventricle has severely decreased function. The left ventricle demonstrates regional wall motion abnormalities (see scoring diagram/findings for description). There is mild  concentric left ventricular hypertrophy. Left ventricular diastolic function could not be evaluated.   3. Right ventricular systolic function is mildly reduced. The right ventricular size is  normal. There is normal pulmonary artery systolic pressure.   4. Left atrial size was severely dilated.   5. The mitral valve is normal in structure. Mild mitral valve regurgitation. No evidence of mitral stenosis.   6. The aortic valve has been repaired/replaced. Aortic valve regurgitation is not visualized. No aortic stenosis is present. There is a 29 mm Sapien prosthetic (TAVR) valve present in the aortic position. Aortic valve mean gradient measures  5.8 mmHg. Aortic valve Vmax measures 1.58 m/s.   7. Aortic dilatation noted. There is moderate dilatation of the ascending aorta, measuring 43 mm.   8. The inferior vena cava is normal in size with greater than 50% respiratory variability, suggesting right atrial pressure of 3 mmHg.    TTE 02/20/21  1. Abnormal septal motion . Left ventricular ejection fraction, by estimation, is 55%. The left ventricle has normal function. The left ventricle has no regional wall motion abnormalities. Left ventricular diastolic parameters were normal.   2. Pacing wires in RA/RV. Right ventricular systolic function is normal. The right ventricular size is normal.   3. Left atrial size was moderately dilated.   4. The mitral valve is abnormal. Mild mitral valve regurgitation. No evidence of mitral stenosis.   5. Post TAVR with 29 mm Sapien 3 valve no significant PVL gradients have increased since previous echo . The aortic valve has been repaired/replaced. Aortic valve regurgitation is not visualized. No aortic stenosis is present.   6. Aortic dilatation noted. There is moderate dilatation of the ascending aorta, measuring 43 mm.   7. The inferior vena cava is normal in size with greater than 50% respiratory variability, suggesting right atrial pressure of 3 mmHg.   Laboratory Data:  High Sensitivity Troponin:   Recent Labs  Lab 03/11/22 1811 03/11/22 2023  TROPONINIHS 152* 133*     Chemistry Recent Labs  Lab 03/11/22 1811  NA 138  K 4.8  CL 108  CO2 17*   GLUCOSE 93  BUN 68*  CREATININE 3.08*  CALCIUM 9.3  GFRNONAA 19*  ANIONGAP 13    No results for input(s): "PROT", "ALBUMIN", "AST", "ALT", "ALKPHOS", "BILITOT" in the last 168 hours. Hematology Recent Labs  Lab 03/11/22 1811  WBC 7.0  RBC 3.91*  HGB 10.0*  HCT 34.4*  MCV 88.0  MCH 25.6*  MCHC 29.1*  RDW 20.8*  PLT 109*   BNP Recent Labs  Lab 03/11/22 1811  BNP 3,602.4*    DDimer No results for input(s): "DDIMER" in the last 168 hours.  Radiology/Studies:  CT CHEST ABDOMEN PELVIS WO CONTRAST  Result Date: 03/12/2022 CLINICAL DATA:  Metastatic disease evaluation. Right urothelial cancer and left renal cell cancer. EXAM: CT CHEST, ABDOMEN AND PELVIS WITHOUT CONTRAST TECHNIQUE: Multidetector CT imaging of the chest, abdomen and pelvis was performed following the standard protocol without IV contrast. RADIATION DOSE REDUCTION: This exam was performed according to the departmental dose-optimization program which includes automated exposure control, adjustment of the mA and/or kV according to patient size and/or use of iterative reconstruction technique. COMPARISON:  11/20/2020 FINDINGS: Absence of IV contrast material limits evaluation of solid organs and vascular structures. CT CHEST FINDINGS Cardiovascular: Cardiac enlargement. Aortic valve prosthesis. No pericardial effusions. Cardiac pacemaker. Normal caliber thoracic aorta. Scattered aortic calcification. Mediastinum/Nodes: Scattered mediastinal lymph nodes are present. Largest are in the subcarinal region measuring 1.2 cm short axis dimension. No change since prior study. Thyroid gland is unremarkable. Esophagus is decompressed. Lungs/Pleura: Small bilateral pleural effusions. Motion artifact limits evaluation of the lungs. There is atelectasis in the lung bases, likely compressive. Interlobular septal thickening in the lung apices may indicate fibrosis or edema. No definitive lung nodules. Musculoskeletal: Degenerative changes in  the spine. Old healed fracture deformity of the right clavicle. Postoperative changes in the right shoulder. No destructive bone lesions. CT ABDOMEN PELVIS FINDINGS Hepatobiliary: Scattered subcentimeter low-attenuation lesions as seen on prior study, likely cysts. Cholelithiasis with several stones in the gallbladder. No inflammatory changes.  No bile duct dilatation. Pancreas: Unremarkable. No pancreatic ductal dilatation or surrounding inflammatory changes. Spleen: Normal in size without focal abnormality. Adrenals/Urinary Tract: No adrenal gland nodules. Surgical absence of the left kidney. There is infiltration around the right kidney and proximal right ureter with mild prominence of the extrarenal pelvis. This area corresponds to a mass lesion better seen with contrast-enhanced imaging on the prior study. Due to differences in technique, it is difficult to compare the 2 studies but the area of the lesion looks larger than on the prior study. Bladder is surgically absent. Right lower quadrant ileal conduit is decompressed. Stomach/Bowel: Stomach, small bowel, and colon are not abnormally distended. Postoperative changes suggesting partial colectomy with ileocolonic anastomosis. Scattered diverticula in the colon without any evidence of acute diverticulitis. Large ventral abdominal wall hernia containing multiple small and large bowel loops as well as omentum and mesentery. No proximal obstruction. Vascular/Lymphatic: Diffuse aortic calcification. No abdominal aortic aneurysm. Distal right iliac artery aneurysm measuring 2.4 cm. No significant lymphadenopathy. Reproductive: Prostate gland appears surgically absent. Other: No free air or free fluid in the abdomen. Musculoskeletal: Degenerative changes in the spine. No destructive bone lesions. IMPRESSION: 1. Lack of IV contrast material limits the examination. 2. Small bilateral pleural effusions with basilar atelectasis. No discrete pulmonary nodules are  identified. 3. Mediastinal lymphadenopathy measuring up to 1.2 cm. No change since prior study. Nonspecific etiology. 4. Cholelithiasis without evidence of acute cholecystitis. 5. Enlargement of the area of the right renal pelvis with surrounding stranding. This corresponds to an enhancing tumor seen on the prior study although less well demonstrated today without IV contrast material. Correlation is limited due to lack of IV contrast material today but this area may be enlarged. There is surrounding stranding which was not present previously. 6. Surgical absence of the left kidney, bladder, and prostate gland. Right lower quadrant ileal conduit. 7. Large broad-based anterior abdominal wall hernia containing small and large bowel as well as mesentery and omentum. No proximal obstruction. 8. Aortic atherosclerosis. 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 Electronically Signed   By: Lucienne Capers M.D.   On: 03/12/2022 00:33   DG Chest Port 1 View  Result Date: 03/11/2022 CLINICAL DATA:  Shortness of breath. EXAM: PORTABLE CHEST 1 VIEW COMPARISON:  February 13, 2021 FINDINGS: Stable dual lead AICD positioning is seen. The cardiac silhouette is mildly enlarged and unchanged in size. An artificial aortic valve is noted. Low lung volumes are seen with mild areas of atelectasis within the bilateral lung bases, right greater than left. There is no evidence of a pleural effusion or pneumothorax. The visualized skeletal structures are unremarkable. IMPRESSION: Low lung volumes with mild bibasilar atelectasis, right greater than left. Electronically Signed   By: Virgina Norfolk M.D.   On: 03/11/2022 18:40   CUP PACEART INCLINIC DEVICE CHECK  Result Date: 03/11/2022 Pacemaker check in clinic. Normal device function. Unable to perform atrial and ventricular threshold d/t AFl w RVR. Sensing and impedances consistent with previous measurements. Device programmed to maximize longevity. AT/AF 52% burden.  Device  programmed at appropriate safety margins. Ventricular histogram elevated. See OV note for further discussion. Device programmed to optimize intrinsic conduction. Estimated longevity 7.5 years. Patient enrolled in remote follow-up. Patient education completed.  {  Assessment and Plan:   #Atrial Flutter #Tachy-Brady Syndrome s/p Engineer, agricultural PPM C2V=4, corresponding to annual CVA risk 4.8%. Recently started on amiodarone. Underwent DCCV on 03/03/22 with successful conversion to NSR. Now with recurrent atrial flutter  RVR on bedside device interrogation in ED.  - Continue apixaban 2.61m BID (confirmed no missed doses) - Continue amiodarone gtt initiated in ED - Plan for IV amiodarone load, repeat DCCV - NPO pMN  #Mixed ICM/NICM (LVEF 25%) Thought to be largely secondary to tachycardia-mediated cardiomyopathy. Progressive heart failure symptoms noted over recent months, currently NYHA III symptoms. Euvolemic on examination. Further escalation of GDMT limited by CKD4.  - Continue current therapies - TBB goal even. Not on home diuretics and no indication to start at this time.  #Fatigue Multifactorial; likely secondary to combination recurrent atrial flutter, chronic HFrEF, anemia, deconditioning, and pembrolizumab (most common side effect, reported in ~30% of patients). Pembrolizumab was started in June 2023. Most recent pembro infusion 02/25/22 (C7).   #AS s/p TAVR Stable gradients on recent echos.   #HTN Well controlled.  For questions or updates, please contact CBaylorPlease consult [email or web address] for contact info under    Signed, DDelorse Limber MD  03/12/2022 1:42 AM

## 2022-03-12 NOTE — ED Notes (Signed)
Helped pt to bedside commode, no acute distress noted in pt.

## 2022-03-12 NOTE — Transfer of Care (Signed)
Immediate Anesthesia Transfer of Care Note  Patient: Jordan Oconnell  Procedure(s) Performed: CARDIOVERSION  Patient Location: Endoscopy Unit  Anesthesia Type:General  Level of Consciousness: sedated  Airway & Oxygen Therapy: Patient Spontanous Breathing  Post-op Assessment: Post -op Vital signs reviewed and stable  Post vital signs: stable  Last Vitals:  Vitals Value Taken Time  BP    Temp    Pulse    Resp    SpO2      Last Pain:  Vitals:   03/12/22 0825  TempSrc: Temporal  PainSc: 0-No pain         Complications: No notable events documented.

## 2022-03-12 NOTE — ED Notes (Signed)
Pt resting, rise/fall of chest noted. Vitals stable. No acute distress noted. Call bell in reach.

## 2022-03-12 NOTE — ED Notes (Addendum)
ED TO INPATIENT HANDOFF REPORT  ED Nurse Name and Phone #: Edd Arbour [phone]  S Name/Age/Gender Jordan Oconnell 82 y.o. male Room/Bed: 037C/037C  Code Status   Code Status: Full Code  Home/SNF/Other Home Patient oriented to: self, place, time, and situation Is this baseline? Yes   Triage Complete: Triage complete  Chief Complaint Atrial fibrillation with rapid ventricular response ([place]) [I48.91]  Triage Note Pt came in POV d/t his heart rate being too high since he assumed it was from his SBP being "high" while checking it at home. He went to see his cardiologist & came here from their office d/t being in A-Fib. Pt also reports recently feeling lethargic & a pressure like feeling in his chest.    Allergies Allergies  Allergen Reactions   Amoxicillin Other (See Comments)    Acute interstitial nephritis   Doxycycline Hives   Flomax [Tamsulosin Hcl] Other (See Comments)    Dizzy     Level of Care/Admitting Diagnosis ED Disposition     ED Disposition  Admit   Condition  --   [facility]: [place] [[id number]]  Level of Care: Progressive [102]  Admit to Progressive based on following criteria: CARDIOVASCULAR & THORACIC of moderate stability with acute coronary syndrome symptoms/low risk myocardial infarction/hypertensive urgency/arrhythmias/heart failure potentially compromising stability and stable post cardiovascular intervention patients.  Admit to Progressive based on following criteria: NEPHROLOGY stable condition requiring close monitoring for AKI, requiring Hemodialysis or Peritoneal Dialysis either from expected electrolyte imbalance, acidosis, or fluid overload that can be managed by NIPPV or high flow oxygen.  May admit patient to [facility] or [facility] if equivalent level of care is available:: No  Covid Evaluation: Symptomatic Person Under Investigation (PUI) or recent exposure (last 10 days) *Testing Required*  Diagnosis: Atrial  fibrillation with rapid ventricular response [facility]) [id number]  Admitting Physician: Clance Boll [id number]  Attending Physician: Clance Boll [id number]  Certification:: I certify this patient will need inpatient services for at least 2 midnights  Estimated Length of Stay: 3          B Medical/Surgery History Past Medical History:  Diagnosis Date   Bladder cancer ([place])    BPH (benign prostatic hyperplasia)    CKD (chronic kidney disease), stage IV (H[facility])    Colon polyps    Diverticulosis    GERD (gastroesophageal reflux disease)    Hyperlipidemia    pt denies   Hypertension    PAF (paroxysmal atrial fibrillation) ([place])    on Flecanide and diltiazem. 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 Burt Knack and Dr. Cyndia Bent   Severe aortic stenosis 10/05/2020   Past Surgical History:  Procedure Laterality Date   APPENDECTOMY     BLADDER SURGERY  2008   transurethral resection/resection of prostatic urethra   BOWEL RESECTION     CARDIOVERSION N/A 03/03/2022   Procedure: CARDIOVERSION;  Surgeon: Buford Dresser, MD;  [facility];  Service: Cardiovascular;  Laterality: N/A;   ESOPHAGOGASTRODUODENOSCOPY N/A 07/24/2013   Procedure: ESOPHAGOGASTRODUODENOSCOPY (EGD);  Surgeon: [name], MD;  Location: [name] ENDOSCOPY;  Service: Endoscopy;  Laterality: N/A;   INTRAOPERATIVE TRANSTHORACIC ECHOCARDIOGRAM N/A 01/27/2021   Procedure: INTRAOPERATIVE TRANSTHORACIC ECHOCARDIOGRAM;  Surgeon: Sherren Mocha, MD;  Location: [place] CV LAB;  Service: Open Heart Surgery;  Laterality: N/A;   laparoscopic surgery  2012   laser   (?)   NEPHRECTOMY  2007   partial, left   NEPHROSTOMY  2011   stent   NM MYOCAR PERF WALL MOTION  09/08/2010   Normal   PACEMAKER IMPLANT N/A 02/12/2021   Procedure: PACEMAKER IMPLANT;   Surgeon: Vickie Epley, MD;  Location: [place] CV LAB;  Service: Cardiovascular;  Laterality: N/A;   RIGHT HEART CATH AND CORONARY ANGIOGRAPHY N/A 10/06/2020   Procedure: RIGHT HEART CATH AND CORONARY ANGIOGRAPHY;  Surgeon: Sherren Mocha, MD;  Location: [place] CV LAB;  Service: Cardiovascular;  Laterality: N/A;   ROBOT ASSISTED LAPAROSCOPIC COMPLETE CYSTECT ILEAL CONDUIT     TRANSCATHETER AORTIC VALVE REPLACEMENT, TRANSFEMORAL Right 01/27/2021   Procedure: TRANSCATHETER AORTIC VALVE REPLACEMENT, TRANSFEMORAL;  Surgeon: Sherren Mocha, MD;  Location: [place] CV LAB;  Service: Open Heart Surgery;  Laterality: Right;   US ECHOCARDIOGRAPHY  0000000   mild diastolic dysfunction,mild dilated LA,mild MR,AI,mildly dilated aortic root     A IV Location/Drains/Wounds Patient Lines/Drains/Airways Status     Active Line/Drains/Airways     Name Placement date Placement time Site Days   Peripheral IV 03/11/22 20 G 2.5" Left;Upper Arm 03/11/22  2152  Arm  1   Peripheral IV 03/11/22 20 G Anterior;Left;Upper Arm 03/11/22  2153  Arm  1   Urostomy Ureterostomy right --  --  --  --   Urostomy RLQ 01/28/21  0821  RLQ  408            Intake/Output Last 24 hours  Intake/Output Summary (Last 24 hours) at 03/12/2022 0524 Last data filed at 03/12/2022 0204 Gross per 24 hour  Intake 139.84 ml  Output 200 ml  Net -60.16 ml    Labs/Imaging Results for orders placed or performed during the hospital encounter of 03/11/22 (from the past 48 hour(s))  Basic metabolic panel     Status: Abnormal   Collection Time: 03/11/22  6:11 PM  Result Value Ref Range   Sodium 138 135 - 145 mmol/L   Potassium 4.8 3.5 - 5.1 mmol/L   Chloride 108 98 - 111 mmol/L   CO2 17 (L) 22 - 32 mmol/L   Glucose, Bld 93 70 - 99 mg/dL    Comment: Glucose reference range applies only to samples taken after fasting for at least 8 hours.   BUN 68 (H) 8 - 23 mg/dL   Creatinine, Ser 3.08 (H) 0.61 - 1.24 mg/dL    Calcium 9.3 8.9 - 10.3 mg/dL   GFR, Estimated 19 (L) >60 mL/min    Comment: (NOTE) Calculated using the CKD-EPI Creatinine Equation (2021)    Anion gap 13 5 - 15    Comment: Performed at [facility] [address], [place], [place] [address]  Brain natriuretic peptide     Status: Abnormal   Collection Time: 03/11/22  6:11 PM  Result Value Ref Range   B Natriuretic Peptide 3,602.4 (H) 0.0 - 100.0 pg/mL    Comment: Performed at [facility] [address] [place],  [address]  Troponin I (High Sensitivity)     Status: Abnormal   Collection Time: 03/11/22  6:11 PM  Result Value Ref Range   Troponin I (High Sensitivity) 152 (HH) <18 ng/L    Comment: CRITICAL RESULT CALLED TO, READ BACK BY AND VERIFIED WITH Dillard [place], RN @ 1939 03/11/22 BY SEKDAHL (NOTE) Elevated high sensitivity troponin I (hsTnI) values and significant  changes  across serial measurements may suggest ACS but many other  chronic and acute conditions are known to elevate hsTnI results.  Refer to the "Links" section for chest pain algorithms and additional  guidance. Performed at [facility], [place] [address], [place], [place] [address]   CBC with Differential     Status: Abnormal   Collection Time: 03/11/22  6:11 PM  Result Value Ref Range   WBC 7.0 4.0 - 10.5 K/uL   RBC 3.91 (L) 4.22 - 5.81 MIL/uL   Hemoglobin 10.0 (L) 13.0 - 17.0 g/dL   HCT 34.4 (L) 39.0 - 52.0 %   MCV 88.0 80.0 - 100.0 fL   MCH 25.6 (L) 26.0 - 34.0 pg   MCHC 29.1 (L) 30.0 - 36.0 g/dL   RDW 20.8 (H) 11.5 - 15.5 %   Platelets 109 (L) 150 - 400 K/uL    Comment: REPEATED TO VERIFY   nRBC 0.0 0.0 - 0.2 %   Neutrophils Relative % 74 %   Neutro Abs 5.3 1.7 - 7.7 K/uL   Lymphocytes Relative 16 %   Lymphs Abs 1.1 0.7 - 4.0 K/uL   Monocytes Relative 6 %   Monocytes Absolute 0.4 0.1 - 1.0 K/uL   Eosinophils Relative 2 %   Eosinophils Absolute 0.1 0.0 - 0.5 K/uL   Basophils Relative 1 %   Basophils Absolute 0.1 0.0 -  0.1 K/uL   Immature Granulocytes 1 %   Abs Immature Granulocytes 0.04 0.00 - 0.07 K/uL    Comment: Performed at [facility], [place] [address] [place], [place] [address]  Magnesium     Status: Abnormal   Collection Time: 03/11/22  8:23 PM  Result Value Ref Range   Magnesium 2.5 (H) 1.7 - 2.4 mg/dL    Comment: Performed at [facility] [address] [place], [place] [address]  Troponin I (High Sensitivity)     Status: Abnormal   Collection Time: 03/11/22  8:23 PM  Result Value Ref Range   Troponin I (High Sensitivity) 133 (HH) <18 ng/L    Comment: CRITICAL VALUE NOTED. VALUE IS CONSISTENT WITH PREVIOUSLY REPORTED/CALLED VALUE (NOTE) Elevated high sensitivity troponin I (hsTnI) values and significant  changes across serial measurements may suggest ACS but many other  chronic and acute conditions are known to elevate hsTnI results.  Refer to the "Links" section for chest pain algorithms and additional  guidance. Performed at [facility], [place] [address], [place], [place] [address]   TSH     Status: Abnormal   Collection Time: 03/12/22 12:32 AM  Result Value Ref Range   TSH 4.609 (H) 0.350 - 4.500 uIU/mL    Comment: Performed by a 3rd Generation assay with a functional sensitivity of <=0.01 uIU/mL. Performed at [facility], [place] [address] [place], [place] [address]   Hemoglobin A1c     Status: None   Collection Time: 03/12/22 12:32 AM  Result Value Ref Range   Hgb A1c MFr Bld 4.9 4.8 - 5.6 %    Comment: (NOTE) Pre diabetes:          5.7%-6.4%  Diabetes:              >6.4%  Glycemic control for   <7.0% adults with diabetes    Mean Plasma Glucose 93.93 mg/dL    Comment: Performed at [facility] [address] [place], [place] [address]  Respiratory (~20 pathogens) panel by PCR  Status: None   Collection Time: 03/12/22 12:32 AM   Specimen: Nasopharyngeal Swab; Respiratory  Result Value Ref Range   Adenovirus NOT DETECTED NOT DETECTED    Coronavirus 229E NOT DETECTED NOT DETECTED    Comment: (NOTE) The Coronavirus on the Respiratory Panel, DOES NOT test for the novel  Coronavirus (2019 nCoV)    Coronavirus HKU1 NOT DETECTED NOT DETECTED   Coronavirus NL63 NOT DETECTED NOT DETECTED   Coronavirus OC43 NOT DETECTED NOT DETECTED   Metapneumovirus NOT DETECTED NOT DETECTED   Rhinovirus / Enterovirus NOT DETECTED NOT DETECTED   Influenza A NOT DETECTED NOT DETECTED   Influenza B NOT DETECTED NOT DETECTED   Parainfluenza Virus 1 NOT DETECTED NOT DETECTED   Parainfluenza Virus 2 NOT DETECTED NOT DETECTED   Parainfluenza Virus 3 NOT DETECTED NOT DETECTED   Parainfluenza Virus 4 NOT DETECTED NOT DETECTED   Respiratory Syncytial Virus NOT DETECTED NOT DETECTED   Bordetella pertussis NOT DETECTED NOT DETECTED   Bordetella Parapertussis NOT DETECTED NOT DETECTED   Chlamydophila pneumoniae NOT DETECTED NOT DETECTED   Mycoplasma pneumoniae NOT DETECTED NOT DETECTED    Comment: Performed at [facility], [place] [address], [place], [place] [address]   CT CHEST ABDOMEN PELVIS WO CONTRAST  Result Date: 03/12/2022 CLINICAL DATA:  Metastatic disease evaluation. Right urothelial cancer and left renal cell cancer. EXAM: CT CHEST, ABDOMEN AND PELVIS WITHOUT CONTRAST TECHNIQUE: Multidetector CT imaging of the chest, abdomen and pelvis was performed following the standard protocol without IV contrast. RADIATION DOSE REDUCTION: This exam was performed according to the departmental dose-optimization program which includes automated exposure control, adjustment of the mA and/or kV according to patient size and/or use of iterative reconstruction technique. COMPARISON:  11/20/2020 FINDINGS: Absence of IV contrast material limits evaluation of solid organs and vascular structures. CT CHEST FINDINGS Cardiovascular: Cardiac enlargement. Aortic valve prosthesis. No pericardial effusions. Cardiac pacemaker. Normal caliber thoracic aorta. Scattered aortic  calcification. Mediastinum/Nodes: Scattered mediastinal lymph nodes are present. Largest are in the subcarinal region measuring 1.2 cm short axis dimension. No change since prior study. Thyroid gland is unremarkable. Esophagus is decompressed. Lungs/Pleura: Small bilateral pleural effusions. Motion artifact limits evaluation of the lungs. There is atelectasis in the lung bases, likely compressive. Interlobular septal thickening in the lung apices may indicate fibrosis or edema. No definitive lung nodules. Musculoskeletal: Degenerative changes in the spine. Old healed fracture deformity of the right clavicle. Postoperative changes in the right shoulder. No destructive bone lesions. CT ABDOMEN PELVIS FINDINGS Hepatobiliary: Scattered subcentimeter low-attenuation lesions as seen on prior study, likely cysts. Cholelithiasis with several stones in the gallbladder. No inflammatory changes. No bile duct dilatation. Pancreas: Unremarkable. No pancreatic ductal dilatation or surrounding inflammatory changes. Spleen: Normal in size without focal abnormality. Adrenals/Urinary Tract: No adrenal gland nodules. Surgical absence of the left kidney. There is infiltration around the right kidney and proximal right ureter with mild prominence of the extrarenal pelvis. This area corresponds to a mass lesion better seen with contrast-enhanced imaging on the prior study. Due to differences in technique, it is difficult to compare the 2 studies but the area of the lesion looks larger than on the prior study. Bladder is surgically absent. Right lower quadrant ileal conduit is decompressed. Stomach/Bowel: Stomach, small bowel, and colon are not abnormally distended. Postoperative changes suggesting partial colectomy with ileocolonic anastomosis. Scattered diverticula in the colon without any evidence of acute diverticulitis. Large ventral abdominal wall hernia containing multiple small and large bowel loops as well as  omentum and  mesentery. No proximal obstruction. Vascular/Lymphatic: Diffuse aortic calcification. No abdominal aortic aneurysm. Distal right iliac artery aneurysm measuring 2.4 cm. No significant lymphadenopathy. Reproductive: Prostate gland appears surgically absent. Other: No free air or free fluid in the abdomen. Musculoskeletal: Degenerative changes in the spine. No destructive bone lesions. IMPRESSION: 1. Lack of IV contrast material limits the examination. 2. Small bilateral pleural effusions with basilar atelectasis. No discrete pulmonary nodules are identified. 3. Mediastinal lymphadenopathy measuring up to 1.2 cm. No change since prior study. Nonspecific etiology. 4. Cholelithiasis without evidence of acute cholecystitis. 5. Enlargement of the area of the right renal pelvis with surrounding stranding. This corresponds to an enhancing tumor seen on the prior study although less well demonstrated today without IV contrast material. Correlation is limited due to lack of IV contrast material today but this area may be enlarged. There is surrounding stranding which was not present previously. 6. Surgical absence of the left kidney, bladder, and prostate gland. Right lower quadrant ileal conduit. 7. Large broad-based anterior abdominal wall hernia containing small and large bowel as well as mesentery and omentum. No proximal obstruction. 8. Aortic atherosclerosis. 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 Electronically Signed   By: Lucienne Capers M.D.   On: 03/12/2022 00:33   DG Chest Port 1 View  Result Date: 03/11/2022 CLINICAL DATA:  Shortness of breath. EXAM: PORTABLE CHEST 1 VIEW COMPARISON:  February 13, 2021 FINDINGS: Stable dual lead AICD positioning is seen. The cardiac silhouette is mildly enlarged and unchanged in size. An artificial aortic valve is noted. Low lung volumes are seen with mild areas of atelectasis within the bilateral lung bases, right greater than left. There is no evidence of a pleural  effusion or pneumothorax. The visualized skeletal structures are unremarkable. IMPRESSION: Low lung volumes with mild bibasilar atelectasis, right greater than left. Electronically Signed   By: Virgina Norfolk M.D.   On: 03/11/2022 18:40   CUP PACEART INCLINIC DEVICE CHECK  Result Date: 03/11/2022 Pacemaker check in clinic. Normal device function. Unable to perform atrial and ventricular threshold d/t AFl w RVR. Sensing and impedances consistent with previous measurements. Device programmed to maximize longevity. AT/AF 52% burden.  Device programmed at appropriate safety margins. Ventricular histogram elevated. See OV note for further discussion. Device programmed to optimize intrinsic conduction. Estimated longevity 7.5 years. Patient enrolled in remote follow-up. Patient education completed.   Pending Labs Unresulted Labs (From admission, onward)     Start     Ordered   03/12/22 0500  Comprehensive metabolic panel  Tomorrow morning,   R        03/11/22 2353   03/12/22 0500  CBC  Tomorrow morning,   R        03/11/22 2353            Vitals/Pain Today's Vitals   03/12/22 0400 03/12/22 0430 03/12/22 0500 03/12/22 0505  BP: 101/86 103/79 (!) 114/90   Pulse: (!) 107 (!) 109 (!) 108   Resp: (!) 31 14 17   $ Temp:    98 F (36.7 C)  TempSrc:    Oral  SpO2: 100% 100% 93%   PainSc:        Isolation Precautions Droplet precaution  Medications Medications  amiodarone (NEXTERONE PREMIX) 360-4.14 MG/200ML-% (1.8 mg/mL) IV infusion (0 mg/hr Intravenous Stopped 03/12/22 0204)  amiodarone (NEXTERONE PREMIX) 360-4.14 MG/200ML-% (1.8 mg/mL) IV infusion (30 mg/hr Intravenous Rate/Dose Change 03/12/22 0203)  ALPRAZolam (XANAX) tablet 0.25 mg (0.25 mg Oral Given 03/12/22  [id number])  levothyroxine (SYNTHROID) tablet 50 mcg (50 mcg Oral Given 03/12/22 0032)  sodium bicarbonate tablet 1,300 mg (1,300 mg Oral Given 03/12/22 0031)  apixaban (ELIQUIS) tablet 2.5 mg (2.5 mg Oral Given 03/12/22 0031)  ferrous  sulfate tablet 325 mg (has no administration in time range)  latanoprost (XALATAN) 0.005 % ophthalmic solution 1 drop (1 drop Both Eyes Not Given 03/12/22 0038)  ondansetron (ZOFRAN) tablet 4 mg (has no administration in time range)    Or  ondansetron (ZOFRAN) injection 4 mg (has no administration in time range)  levalbuterol (XOPENEX) nebulizer solution 0.63 mg (has no administration in time range)  amiodarone (NEXTERONE) 1.8 mg/mL load via infusion 150 mg (150 mg Intravenous Bolus from Bag 03/11/22 2145)    Mobility walks with person assist     Focused Assessments Cardiac Assessment Handoff:  Cardiac Rhythm: Atrial fibrillation No results found for: "CKTOTAL", "CKMB", "CKMBINDEX", "TROPONINI" Lab Results  Component Value Date   DDIMER 3.85 (H) 09/04/2010   Does the Patient currently have chest pain? No   , Neuro Assessment Handoff:  Swallow screen pass? Yes  Cardiac Rhythm: Atrial fibrillation       Neuro Assessment: Within Defined Limits Neuro Checks:      Has TPA been given? No If patient is a Neuro Trauma and patient is going to OR before floor call report to [facility] nurse: [phone] or [phone]  , Pulmonary Assessment Handoff:  Lung sounds: Bilateral Breath Sounds: Clear L Breath Sounds: Clear R Breath Sounds: Clear O2 Device: Nasal Cannula O2 Flow Rate (L/min): 2 L/min    R Recommendations: See Admitting Provider Note  Report given to: Mia, RN  Additional Notes: Amiodarone drip

## 2022-03-12 NOTE — H&P (View-Only) (Signed)
Rounding Note    Patient Name: Jordan Oconnell Date of Encounter: 03/12/2022  [place] Cardiologist: Sanda Klein, MD   Subjective   He is dyspneic and tachypneic, but is able to lie almost fully supine in bed. Remains in atrial flutter with 2: 1 AV block with ventricular rate of 120-130 bpm. Device interrogation today confirms atrial flutter with a cycle length of approximately 280 ms.  Multiple attempts at overdrive pacing were unsuccessful, ultimately resulting in deterioration to atrial fibrillation with rapid ventricular response, but the atrial flutter rhythm reorganized shortly thereafter. Has been compliant with apixaban anticoagulation, without interruption.  No recent bleeding problems.  Inpatient Medications    Scheduled Meds:  ALPRAZolam  0.25 mg Oral QHS   apixaban  2.5 mg Oral BID   ferrous sulfate  325 mg Oral Q breakfast   latanoprost  1 drop Both Eyes QHS   levothyroxine  50 mcg Oral QHS   sodium bicarbonate  1,300 mg Oral BID   Continuous Infusions:  amiodarone 30 mg/hr (03/12/22 0203)   PRN Meds: levalbuterol, ondansetron **OR** ondansetron (ZOFRAN) IV   Vital Signs    Vitals:   03/12/22 0430 03/12/22 0500 03/12/22 0505 03/12/22 0650  BP: 103/79 (!) 114/90  (!) 111/92  Pulse: (!) 109 (!) 108  (!) 109  Resp: 14 17  20  $ Temp:   98 F (36.7 C) 98 F (36.7 C)  TempSrc:   Oral Oral  SpO2: 100% 93%  100%    Intake/Output Summary (Last 24 hours) at 03/12/2022 0801 Last data filed at 03/12/2022 [id number] Gross per 24 hour  Intake 139.84 ml  Output 525 ml  Net -385.16 ml      03/11/2022    2:41 PM 03/03/2022   11:53 AM 03/01/2022    8:16 AM  Last 3 Weights  Weight (lbs) 183 lb 188 lb 188 lb  Weight (kg) 83.008 kg 85.276 kg 85.276 kg      Telemetry    Atrial flutter with 2: 1 AV block, ventricular rate 120-130 bpm- Personally Reviewed  ECG    Atrial flutter with 2: 1 AV block, left bundle branch block, very broad QRS of 170 ms-  Personally Reviewed  Physical Exam  Tachypneic, but able to speak in complete sentences. GEN: No acute distress.   Neck: No JVD Cardiac: RRR, tachycardic, faint aortic ejection murmur, no diastolic murmurs, rubs, or gallops.  Respiratory: Clear to auscultation bilaterally. GI: Soft, nontender, non-distended  MS: No edema; No deformity. Neuro:  Nonfocal  Psych: Normal affect   Labs    High Sensitivity Troponin:   Recent Labs  Lab 03/11/22 1811 03/11/22 2023  TROPONINIHS 152* 133*     Chemistry Recent Labs  Lab 03/11/22 1811 03/11/22 2023  NA 138  --   K 4.8  --   CL 108  --   CO2 17*  --   GLUCOSE 93  --   BUN 68*  --   CREATININE 3.08*  --   CALCIUM 9.3  --   MG  --  2.5*  GFRNONAA 19*  --   ANIONGAP 13  --     Lipids No results for input(s): "CHOL", "TRIG", "HDL", "LABVLDL", "LDLCALC", "CHOLHDL" in the last 168 hours.  Hematology Recent Labs  Lab 03/11/22 1811  WBC 7.0  RBC 3.91*  HGB 10.0*  HCT 34.4*  MCV 88.0  MCH 25.6*  MCHC 29.1*  RDW 20.8*  PLT 109*   Thyroid  Recent Labs  Lab 03/12/22 0032  TSH 4.609*    BNP Recent Labs  Lab 03/11/22 1811  BNP 3,602.4*    DDimer No results for input(s): "DDIMER" in the last 168 hours.   Radiology    CT CHEST ABDOMEN PELVIS WO CONTRAST  Result Date: 03/12/2022 CLINICAL DATA:  Metastatic disease evaluation. Right urothelial cancer and left renal cell cancer. EXAM: CT CHEST, ABDOMEN AND PELVIS WITHOUT CONTRAST TECHNIQUE: Multidetector CT imaging of the chest, abdomen and pelvis was performed following the standard protocol without IV contrast. RADIATION DOSE REDUCTION: This exam was performed according to the departmental dose-optimization program which includes automated exposure control, adjustment of the mA and/or kV according to patient size and/or use of iterative reconstruction technique. COMPARISON:  11/20/2020 FINDINGS: Absence of IV contrast material limits evaluation of solid organs and vascular  structures. CT CHEST FINDINGS Cardiovascular: Cardiac enlargement. Aortic valve prosthesis. No pericardial effusions. Cardiac pacemaker. Normal caliber thoracic aorta. Scattered aortic calcification. Mediastinum/Nodes: Scattered mediastinal lymph nodes are present. Largest are in the subcarinal region measuring 1.2 cm short axis dimension. No change since prior study. Thyroid gland is unremarkable. Esophagus is decompressed. Lungs/Pleura: Small bilateral pleural effusions. Motion artifact limits evaluation of the lungs. There is atelectasis in the lung bases, likely compressive. Interlobular septal thickening in the lung apices may indicate fibrosis or edema. No definitive lung nodules. Musculoskeletal: Degenerative changes in the spine. Old healed fracture deformity of the right clavicle. Postoperative changes in the right shoulder. No destructive bone lesions. CT ABDOMEN PELVIS FINDINGS Hepatobiliary: Scattered subcentimeter low-attenuation lesions as seen on prior study, likely cysts. Cholelithiasis with several stones in the gallbladder. No inflammatory changes. No bile duct dilatation. Pancreas: Unremarkable. No pancreatic ductal dilatation or surrounding inflammatory changes. Spleen: Normal in size without focal abnormality. Adrenals/Urinary Tract: No adrenal gland nodules. Surgical absence of the left kidney. There is infiltration around the right kidney and proximal right ureter with mild prominence of the extrarenal pelvis. This area corresponds to a mass lesion better seen with contrast-enhanced imaging on the prior study. Due to differences in technique, it is difficult to compare the 2 studies but the area of the lesion looks larger than on the prior study. Bladder is surgically absent. Right lower quadrant ileal conduit is decompressed. Stomach/Bowel: Stomach, small bowel, and colon are not abnormally distended. Postoperative changes suggesting partial colectomy with ileocolonic anastomosis. Scattered  diverticula in the colon without any evidence of acute diverticulitis. Large ventral abdominal wall hernia containing multiple small and large bowel loops as well as omentum and mesentery. No proximal obstruction. Vascular/Lymphatic: Diffuse aortic calcification. No abdominal aortic aneurysm. Distal right iliac artery aneurysm measuring 2.4 cm. No significant lymphadenopathy. Reproductive: Prostate gland appears surgically absent. Other: No free air or free fluid in the abdomen. Musculoskeletal: Degenerative changes in the spine. No destructive bone lesions. IMPRESSION: 1. Lack of IV contrast material limits the examination. 2. Small bilateral pleural effusions with basilar atelectasis. No discrete pulmonary nodules are identified. 3. Mediastinal lymphadenopathy measuring up to 1.2 cm. No change since prior study. Nonspecific etiology. 4. Cholelithiasis without evidence of acute cholecystitis. 5. Enlargement of the area of the right renal pelvis with surrounding stranding. This corresponds to an enhancing tumor seen on the prior study although less well demonstrated today without IV contrast material. Correlation is limited due to lack of IV contrast material today but this area may be enlarged. There is surrounding stranding which was not present previously. 6. Surgical absence of the left kidney, bladder, and prostate gland. Right lower quadrant  ileal conduit. 7. Large broad-based anterior abdominal wall hernia containing small and large bowel as well as mesentery and omentum. No proximal obstruction. 8. Aortic atherosclerosis. 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 Electronically Signed   By: Lucienne Capers M.D.   On: 03/12/2022 00:33   DG Chest Port 1 View  Result Date: 03/11/2022 CLINICAL DATA:  Shortness of breath. EXAM: PORTABLE CHEST 1 VIEW COMPARISON:  February 13, 2021 FINDINGS: Stable dual lead AICD positioning is seen. The cardiac silhouette is mildly enlarged and unchanged in size. An  artificial aortic valve is noted. Low lung volumes are seen with mild areas of atelectasis within the bilateral lung bases, right greater than left. There is no evidence of a pleural effusion or pneumothorax. The visualized skeletal structures are unremarkable. IMPRESSION: Low lung volumes with mild bibasilar atelectasis, right greater than left. Electronically Signed   By: Virgina Norfolk M.D.   On: 03/11/2022 18:40   CUP PACEART INCLINIC DEVICE CHECK  Result Date: 03/11/2022 Pacemaker check in clinic. Normal device function. Unable to perform atrial and ventricular threshold d/t AFl w RVR. Sensing and impedances consistent with previous measurements. Device programmed to maximize longevity. AT/AF 52% burden.  Device programmed at appropriate safety margins. Ventricular histogram elevated. See OV note for further discussion. Device programmed to optimize intrinsic conduction. Estimated longevity 7.5 years. Patient enrolled in remote follow-up. Patient education completed.   Cardiac Studies    Relevant CV Studies: TTE 02/19/22  1. Systolic dysfunction is new compared with the echo 01/2021.   2. Left ventricular ejection fraction, by estimation, is 20 to 25%. The left ventricle has severely decreased function. The left ventricle demonstrates regional wall motion abnormalities (see scoring diagram/findings for description). There is mild  concentric left ventricular hypertrophy. Left ventricular diastolic function could not be evaluated.   3. Right ventricular systolic function is mildly reduced. The right ventricular size is normal. There is normal pulmonary artery systolic pressure.   4. Left atrial size was severely dilated.   5. The mitral valve is normal in structure. Mild mitral valve regurgitation. No evidence of mitral stenosis.   6. The aortic valve has been repaired/replaced. Aortic valve regurgitation is not visualized. No aortic stenosis is present. There is a 29 mm Sapien prosthetic  (TAVR) valve present in the aortic position. Aortic valve mean gradient measures 5.8 mmHg. Aortic valve Vmax measures 1.58 m/s.   7. Aortic dilatation noted. There is moderate dilatation of the ascending aorta, measuring 43 mm.   8. The inferior vena cava is normal in size with greater than 50% respiratory variability, suggesting right atrial pressure of 3 mmHg.    TTE 02/20/21  1. Abnormal septal motion . Left ventricular ejection fraction, by estimation, is 55%. The left ventricle has normal function. The left ventricle has no regional wall motion abnormalities. Left ventricular diastolic parameters were normal.   2. Pacing wires in RA/RV. Right ventricular systolic function is normal. The right ventricular size is normal.   3. Left atrial size was moderately dilated.   4. The mitral valve is abnormal. Mild mitral valve regurgitation. No evidence of mitral stenosis.   5. Post TAVR with 29 mm Sapien 3 valve no significant PVL gradients have increased since previous echo . The aortic valve has been repaired/replaced. Aortic valve regurgitation is not visualized. No aortic stenosis is present.   6. Aortic dilatation noted. There is moderate dilatation of the ascending aorta, measuring 43 mm.   7. The inferior vena cava is normal in  size with greater than 50% respiratory variability, suggesting right atrial pressure of 3 mmHg.     Patient Profile     82 y.o. 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  Had successful DC cardioversion 03/03/2022, with early recurrence of arrhythmia despite recent initiation of oral amiodarone on 03/01/2022.  Assessment & Plan    Atrial flutter, atypical: Remains very tachycardic, in heart failure.  He has always been oblivious to palpitations.  In the past year, he had a relatively high burden of atrial fibrillation around 30%, but this was paroxysmal and was adequately rate controlled.  Since he has been in this persistent flutter, the arrhythmia has been very difficult to rate control.  Telemetry and pacemaker interrogation shows atrial flutter with atrial cycle length of about 280 ms.  ECG shows native left bundle branch block morphology with a very broad QRS of 170 ms.  Has been on IV amiodarone overnight.  He has been fully anticoagulated for the last 6 weeks, without interruption.  Attempted overdrive pacing of the atrial flutter with burst pacing at cycle length of 240-230-220-210-200 ms, with evidence of engagement of the arrhythmia but without termination, eventually leading to deterioration to atrial fibrillation with rapid ventricular response.  After a few minutes, atrial flutter reorganized, with 2: 1 AV block and ventricular rate of about 130 bpm.  Scheduled for another attempt at DC cardioversion today at 09 30h. This procedure has been fully reviewed with the patient and written informed consent has been obtained.   2.  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  If we are successful in restoration of normal rhythm, this will hopefully lead to improved left ventricular systolic function.  He would also benefit from reprogramming his device to committed atrial sensed-ventricular paced rhythm since the QRS complex during ventricular pacing via  his RV left bundle area lead is actually narrower than the native QRS (roughly 140 ms versus 170 ms).  Hopefully with restoration of normal rhythm, resolution of tachycardia cardiomyopathy and some degree of resynchronization through left bundle area pacing we will gradually improve his heart failure. Not a good candidate for many guideline directed oncological therapies for heart failure due to advanced chronic kidney disease. 3.  PPM: Normal device function.  Plan to reprogram to DDDR mode with short AV delay for purposeful ventricular pacing, after successful cardioversion.  Have discussed with Dr. [name]. 4.  Iron Deficiency anemia: A recurrent problem.  Has had hematuria in the past, not recently.  Just started on iron supplements at the beginning of February.  No evidence of active bleeding.  We have discussed Watchman device implantation, but need to get him beyond the current episode of heart failure first. 5.  CKD4: Due to surgical right nephrectomy partial left nephrectomy for urothelial cancer.  Baseline creatinine is 2.7-3.0.  Has a urostomy.  No active hematuria.  Urologist at [facility] is Dr. [name], nephrologist is Dr. [name].  He takes supplements  of sodium bicarbonate for chronic hyperchloremic metabolic acidosis. 6.  Anticoagulation: Eliquis dose adjusted for age and renal dysfunction.  Has been on uninterrupted anticoagulation since January 4.  So far he has tolerated 6 weeks of anticoagulation without active bleeding, although he has developed mild iron deficiency.  I think if we can get the heart failure taken care of, he would be a reasonable candidate for a Watchman device. 7.  TAVR: Normal prosthetic valve function by recent echo. 8.  CAD: He has never had angina.  The only severe stenosis by cardiac catheterization pre-TAVR September 2022 was in a first diagonal artery.  Has never required revascularization.  Previously on flecainide, this medication was discontinued when coronary  disease was diagnosed.   Shared Decision Making/Informed Consent The risks (stroke, cardiac arrhythmias rarely resulting in the need for a temporary or permanent pacemaker, skin irritation or burns and complications associated with conscious sedation including aspiration, arrhythmia, respiratory failure and death), benefits (restoration of normal sinus rhythm) and alternatives of a direct current cardioversion were explained in detail to Mr. Moscatelli and he agrees to proceed.        For questions or updates, please contact [place] Please consult [email or web address] for contact info under        Signed, Sanda Klein, MD  03/12/2022, 8:01 AM

## 2022-03-12 NOTE — Anesthesia Procedure Notes (Signed)
Procedure Name: General with mask airway Date/Time: 03/12/2022 9:39 AM  Performed by: Lavell Luster, CRNAPre-anesthesia Checklist: Patient identified, Emergency Drugs available, Suction available, Patient being monitored and Timeout performed Patient Re-evaluated:Patient Re-evaluated prior to induction Oxygen Delivery Method: Ambu bag Preoxygenation: Pre-oxygenation with 100% oxygen Induction Type: IV induction Placement Confirmation: breath sounds checked- equal and bilateral and positive ETCO2 Dental Injury: Teeth and Oropharynx as per pre-operative assessment

## 2022-03-12 NOTE — Consult Note (Addendum)
ELECTROPHYSIOLOGY CONSULT NOTE    Patient ID: DELONTA MALVEAUX MRN: [id number], DOB/AGE: 06-18-1940 82 y.o.  Admit date: 03/11/2022 Date of Consult: 03/12/2022  Primary Physician: Burnard Bunting, MD Primary Cardiologist: Sanda Klein, MD  Electrophysiologist: Dr. [name]   Referring Provider: Dr. Sallyanne Kuster  Patient Profile: RHAMEL BOULEY is a 82 y.o. 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   HPI:  StMAKIH SEVERSONs a 82 y.o. male with medical history as above, well known to EP team.  Pt called to report steadily worsening SOB and fatigue [facility] this [facility] He was seen in EP clinic 2/15 and noted to be in AFL with RVR and NYHA IIIb-IV symptoms. He was sent to the ED for further evaluation and treatment.     Previously, he had been taken off of Eliquis for hematuria, and had also in that setting been taken off amiodarone.   The eliquis was resumed at the lower appropriate dose in December, but amiodarone was not resumed until more recently with recurrent issues with AFL.   Pt loaded on IV amiodarone [facility]   Pt maintaining NSR this afternoon. Feeling somewhat better, but has not been up moving around much yet. No SOB currently at rest. Denies peripheral edema. Has had OK UOP so far today. Not receiving diuretics, not on home diuretics at this time either. Baseline Cr 2.7 - 3.0  Labs Potassium4.7 (02/16 0713) Magnesium   2.5* (02/15 2023) Creatinine, ser  3.06* (02/16 0713) PLT  98* (02/16 0713) HGB  9.6* (02/16 0713) WBC 6.3 (02/16 0713) Troponin I (High Sensitivity)133* (02/15 2023).    Past Medical History:  Diagnosis Date   Bladder cancer ([place]   BPH (benign prostatic hyperplasia)    CKD (chronic kidney disease), stage IV (HCC)    Colon polyps    Diverticulosis    GERD (gastroesophageal reflux disease)    Hyperlipidemia    pt denies   Hypertension    PAF (paroxysmal atrial fibrillation) ([place]   on Flecanide and diltiazem. No OAC given recurrent hematuria   Renal cell carcinoma ([facility]   left   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 CoBurt Knacknd Dr. BaCyndia Bent Severe aortic stenosis 10/05/2020     Surgical History:  Past Surgical History:  Procedure Laterality Date   APPENDECTOMY     BLADDER SURGERY  2008   transurethral resection/resection of prostatic urethra   BOWEL RESECTION     CARDIOVERSION N/A 03/03/2022   Procedure: CARDIOVERSION;  Surgeon: ChBuford DresserMD;  [facility];  Service: Cardiovascular;  Laterality: N/A;   ESOPHAGOGASTRODUODENOSCOPY N/A 07/24/2013   Procedure: ESOPHAGOGASTRODUODENOSCOPY (EGD);  Surgeon: JoIrene ShipperMD;  [facility];  Service: Endoscopy;  Laterality: N/A;   INTRAOPERATIVE TRANSTHORACIC ECHOCARDIOGRAM N/A 01/27/2021   Procedure: INTRAOPERATIVE TRANSTHORACIC ECHOCARDIOGRAM;  Surgeon: CoSherren MochaMD;  Location: [facility];  Service: Open Heart  Surgery;  Laterality: N/A;   laparoscopic surgery  2012   laser   (?)   NEPHRECTOMY  2007   partial, left   NEPHROSTOMY  2011   stent   NM MYOCAR PERF WALL MOTION  09/08/2010   Normal   PACEMAKER IMPLANT N/A 02/12/2021   Procedure: PACEMAKER IMPLANT;  Surgeon: Vickie Epley, MD;  Location: [place] CV LAB;  Service: Cardiovascular;  Laterality: N/A;   RIGHT HEART CATH AND  CORONARY ANGIOGRAPHY N/A 10/06/2020   Procedure: RIGHT HEART CATH AND CORONARY ANGIOGRAPHY;  Surgeon: Sherren Mocha, MD;  Location: [place] CV LAB;  Service: Cardiovascular;  Laterality: N/A;   ROBOT ASSISTED LAPAROSCOPIC COMPLETE CYSTECT ILEAL CONDUIT     TRANSCATHETER AORTIC VALVE REPLACEMENT, TRANSFEMORAL Right 01/27/2021   Procedure: TRANSCATHETER AORTIC VALVE REPLACEMENT, TRANSFEMORAL;  Surgeon: Sherren Mocha, MD;  Location: [place] CV LAB;  Service: Open Heart Surgery;  Laterality: Right;   US ECHOCARDIOGRAPHY  0000000   mild diastolic dysfunction,mild dilated LA,mild MR,AI,mildly dilated aortic root     Medications Prior to Admission  Medication Sig Dispense Refill Last Dose   ALPRAZolam (XANAX) 0.25 MG tablet Take 0.25 mg by mouth at bedtime.   03/10/2022 at pm   amiodarone (PACERONE) 400 MG tablet Take 1 tablet (400 mg total) by mouth daily. 90 tablet 3 03/11/2022 at 0830   azithromycin (ZITHROMAX) 500 MG tablet Take 1 tablet by mouth 1 hour before dental procedures and cleanings 12 tablet 6 UNKNOWN   ELIQUIS 2.5 MG TABS tablet Take 2.5 mg by mouth 2 (two) times daily.   03/11/2022 at 0830   ferrous sulfate 325 (65 FE) MG EC tablet Take 1 tablet (325 mg total) by mouth in the morning and at bedtime. 180 tablet 3 03/11/2022 at am   latanoprost (XALATAN) 0.005 % ophthalmic solution Place 1 drop into both eyes at bedtime.   03/10/2022 at pm   levothyroxine (SYNTHROID) 50 MCG tablet Take 50 mcg by mouth at bedtime.   03/10/2022 at pm   metoprolol succinate (TOPROL-XL) 50 MG 24 hr tablet Take 1 tablet (50 mg total) by mouth in the morning and at bedtime. Take with or immediately following a meal. (Patient taking differently: Take 25 mg by mouth in the morning and at bedtime. Take with or immediately following a meal.) 90 tablet 3 03/11/2022 at 0830   mirtazapine (REMERON) 15 MG tablet Take 7.5 mg by mouth at bedtime.   03/10/2022 at pm   Pembrolizumab (KEYTRUDA IV) Inject into the vein  every 6 (six) weeks. Infusion   UNKNOWN   sodium bicarbonate 650 MG tablet Take 1,300 mg by mouth 2 (two) times daily.   03/11/2022 at am    Inpatient Medications:   ALPRAZolam  0.25 mg Oral QHS   apixaban  2.5 mg Oral BID   ferrous sulfate  325 mg Oral Q breakfast   latanoprost  1 drop Both Eyes QHS   levothyroxine  50 mcg Oral QHS   sodium bicarbonate  1,300 mg Oral BID    Allergies:  Allergies  Allergen Reactions   Amoxicillin Other (See Comments)    Acute interstitial nephritis   Doxycycline Hives   Flomax [Tamsulosin Hcl] Other (See Comments)    Dizzy     Family History  Problem Relation Age of Onset   Ovarian cancer Mother    Colon cancer Mother    Colon cancer Father    Crohn's disease Son    Breast cancer Daughter    Colon  cancer Brother      Physical Exam: Vitals:   03/12/22 1040 03/12/22 1200 03/12/22 1231 03/12/22 1300  BP: 120/77 115/80 112/85 113/77  Pulse:   77 80  Resp: 15 18 20 20  $ Temp:      TempSrc:   Oral   SpO2: 97%  97% 93%  Weight:      Height:        GEN- NAD, A&O x 3, normal affect HEENT: Normocephalic, atraumatic Lungs- CTAB, Normal effort.  Heart- Regular rate and rhythm, No M/G/R.  GI- Soft, NT, ND.  Extremities- No clubbing, cyanosis, or edema   Radiology/Studies: CT CHEST ABDOMEN PELVIS WO CONTRAST  Result Date: 03/12/2022 CLINICAL DATA:  Metastatic disease evaluation. Right urothelial cancer and left renal cell cancer. EXAM: CT CHEST, ABDOMEN AND PELVIS WITHOUT CONTRAST TECHNIQUE: Multidetector CT imaging of the chest, abdomen and pelvis was performed following the standard protocol without IV contrast. RADIATION DOSE REDUCTION: This exam was performed according to the departmental dose-optimization program which includes automated exposure control, adjustment of the mA and/or kV according to patient size and/or use of iterative reconstruction technique. COMPARISON:  11/20/2020 FINDINGS: Absence of IV contrast material limits  evaluation of solid organs and vascular structures. CT CHEST FINDINGS Cardiovascular: Cardiac enlargement. Aortic valve prosthesis. No pericardial effusions. Cardiac pacemaker. Normal caliber thoracic aorta. Scattered aortic calcification. Mediastinum/Nodes: Scattered mediastinal lymph nodes are present. Largest are in the subcarinal region measuring 1.2 cm short axis dimension. No change since prior study. Thyroid gland is unremarkable. Esophagus is decompressed. Lungs/Pleura: Small bilateral pleural effusions. Motion artifact limits evaluation of the lungs. There is atelectasis in the lung bases, likely compressive. Interlobular septal thickening in the lung apices may indicate fibrosis or edema. No definitive lung nodules. Musculoskeletal: Degenerative changes in the spine. Old healed fracture deformity of the right clavicle. Postoperative changes in the right shoulder. No destructive bone lesions. CT ABDOMEN PELVIS FINDINGS Hepatobiliary: Scattered subcentimeter low-attenuation lesions as seen on prior study, likely cysts. Cholelithiasis with several stones in the gallbladder. No inflammatory changes. No bile duct dilatation. Pancreas: Unremarkable. No pancreatic ductal dilatation or surrounding inflammatory changes. Spleen: Normal in size without focal abnormality. Adrenals/Urinary Tract: No adrenal gland nodules. Surgical absence of the left kidney. There is infiltration around the right kidney and proximal right ureter with mild prominence of the extrarenal pelvis. This area corresponds to a mass lesion better seen with contrast-enhanced imaging on the prior study. Due to differences in technique, it is difficult to compare the 2 studies but the area of the lesion looks larger than on the prior study. Bladder is surgically absent. Right lower quadrant ileal conduit is decompressed. Stomach/Bowel: Stomach, small bowel, and colon are not abnormally distended. Postoperative changes suggesting partial colectomy  with ileocolonic anastomosis. Scattered diverticula in the colon without any evidence of acute diverticulitis. Large ventral abdominal wall hernia containing multiple small and large bowel loops as well as omentum and mesentery. No proximal obstruction. Vascular/Lymphatic: Diffuse aortic calcification. No abdominal aortic aneurysm. Distal right iliac artery aneurysm measuring 2.4 cm. No significant lymphadenopathy. Reproductive: Prostate gland appears surgically absent. Other: No free air or free fluid in the abdomen. Musculoskeletal: Degenerative changes in the spine. No destructive bone lesions. IMPRESSION: 1. Lack of IV contrast material limits the examination. 2. Small bilateral pleural effusions with basilar atelectasis. No discrete pulmonary nodules are identified. 3. Mediastinal lymphadenopathy measuring up to 1.2 cm. No change since prior study. Nonspecific etiology. 4. Cholelithiasis without evidence of acute cholecystitis. 5. Enlargement of the  area of the right renal pelvis with surrounding stranding. This corresponds to an enhancing tumor seen on the prior study although less well demonstrated today without IV contrast material. Correlation is limited due to lack of IV contrast material today but this area may be enlarged. There is surrounding stranding which was not present previously. 6. Surgical absence of the left kidney, bladder, and prostate gland. Right lower quadrant ileal conduit. 7. Large broad-based anterior abdominal wall hernia containing small and large bowel as well as mesentery and omentum. No proximal obstruction. 8. Aortic atherosclerosis. 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 Electronically Signed   By: Lucienne Capers M.D.   On: 03/12/2022 00:33   DG Chest Port 1 View  Result Date: 03/11/2022 CLINICAL DATA:  Shortness of breath. EXAM: PORTABLE CHEST 1 VIEW COMPARISON:  February 13, 2021 FINDINGS: Stable dual lead AICD positioning is seen. The cardiac silhouette is mildly  enlarged and unchanged in size. An artificial aortic valve is noted. Low lung volumes are seen with mild areas of atelectasis within the bilateral lung bases, right greater than left. There is no evidence of a pleural effusion or pneumothorax. The visualized skeletal structures are unremarkable. IMPRESSION: Low lung volumes with mild bibasilar atelectasis, right greater than left. Electronically Signed   By: Virgina Norfolk M.D.   On: 03/11/2022 18:40   CUP PACEART INCLINIC DEVICE CHECK  Result Date: 03/11/2022 Pacemaker check in clinic. Normal device function. Unable to perform atrial and ventricular threshold d/t AFl w RVR. Sensing and impedances consistent with previous measurements. Device programmed to maximize longevity. AT/AF 52% burden.  Device programmed at appropriate safety margins. Ventricular histogram elevated. See OV note for further discussion. Device programmed to optimize intrinsic conduction. Estimated longevity 7.5 years. Patient enrolled in remote follow-up. Patient education completed.  CUP PACEART REMOTE DEVICE CHECK  Result Date: 02/24/2022 Scheduled remote reviewed. Normal device function.  Persistent AFL, burden 72%, rates controlled ~70% of the time,  Metoprolol, Eliquis Next remote 91 days. LA  ECHOCARDIOGRAM COMPLETE  Result Date: 02/19/2022    ECHOCARDIOGRAM REPORT   Patient Name:   Issai ORLANDER BIRDWELL Date of Exam: 02/19/2022 Medical Rec #:  [id number]         Height:       73.0 in Accession #:    [id number]        Weight:       201.0 lb Date of Birth:  1940/08/13         BSA:          2.156 m Patient Age:    74 years          BP:           102/58 mmHg Patient Gender: M                 HR:           96 bpm. Exam Location:  [facility] Procedure: 2D Echo, Cardiac Doppler and Color Doppler Indications:    Z95.2 Status post TAVR  History:        Patient has prior history of Echocardiogram examinations, most                 recent 02/20/2021. Status post TAVR-23m Edwards S3UR,                  Arrythmias:Atrial Fibrillation; Risk Factors:Hypertension and                 Dyslipidemia.  Aortic Valve: 29 mm Sapien prosthetic, stented (TAVR) valve is                 present in the aortic position.  Sonographer:    Cresenciano Lick RDCS Referring Phys: [id number] [place]  1. Systolic dysfunction is new compared with the echo 01/2021.  2. Left ventricular ejection fraction, by estimation, is 20 to 25%. The left ventricle has severely decreased function. The left ventricle demonstrates regional wall motion abnormalities (see scoring diagram/findings for description). There is mild concentric left ventricular hypertrophy. Left ventricular diastolic function could not be evaluated.  3. Right ventricular systolic function is mildly reduced. The right ventricular size is normal. There is normal pulmonary artery systolic pressure.  4. Left atrial size was severely dilated.  5. The mitral valve is normal in structure. Mild mitral valve regurgitation. No evidence of mitral stenosis.  6. The aortic valve has been repaired/replaced. Aortic valve regurgitation is not visualized. No aortic stenosis is present. There is a 29 mm Sapien prosthetic (TAVR) valve present in the aortic position. Aortic valve mean gradient measures 5.8 mmHg. Aortic valve Vmax measures 1.58 m/s.  7. Aortic dilatation noted. There is moderate dilatation of the ascending aorta, measuring 43 mm.  8. The inferior vena cava is normal in size with greater than 50% respiratory variability, suggesting right atrial pressure of 3 mmHg. FINDINGS  Left Ventricle: Left ventricular ejection fraction, by estimation, is 20 to 25%. The left ventricle has severely decreased function. The left ventricle demonstrates regional wall motion abnormalities. The left ventricular internal cavity size was normal  in size. There is mild concentric left ventricular hypertrophy. Left ventricular diastolic function could not  be evaluated due to atrial fibrillation. Left ventricular diastolic function could not be evaluated.  LV Wall Scoring: The mid and distal inferior wall is akinetic. The entire septum, basal inferior segment, and apex are hypokinetic. The entire anterior wall and entire lateral wall are normal. Right Ventricle: The right ventricular size is normal. No increase in right ventricular wall thickness. Right ventricular systolic function is mildly reduced. There is normal pulmonary artery systolic pressure. The tricuspid regurgitant velocity is 2.18 m/s, and with an assumed right atrial pressure of 8 mmHg, the estimated right ventricular systolic pressure is 123456 mmHg. Left Atrium: Left atrial size was severely dilated. Right Atrium: Right atrial size was normal in size. Pericardium: There is no evidence of pericardial effusion. Mitral Valve: The mitral valve is normal in structure. Mild mitral annular calcification. Mild mitral valve regurgitation. No evidence of mitral valve stenosis. Tricuspid Valve: The tricuspid valve is normal in structure. Tricuspid valve regurgitation is trivial. No evidence of tricuspid stenosis. Aortic Valve: The aortic valve has been repaired/replaced. Aortic valve regurgitation is not visualized. No aortic stenosis is present. Aortic valve mean gradient measures 5.8 mmHg. Aortic valve peak gradient measures 9.9 mmHg. There is a 29 mm Sapien prosthetic, stented (TAVR) valve present in the aortic position. Pulmonic Valve: The pulmonic valve was normal in structure. Pulmonic valve regurgitation is not visualized. No evidence of pulmonic stenosis. Aorta: Aortic dilatation noted. There is moderate dilatation of the ascending aorta, measuring 43 mm. Venous: The inferior vena cava is normal in size with greater than 50% respiratory variability, suggesting right atrial pressure of 3 mmHg. IAS/Shunts: No atrial level shunt detected by color flow Doppler. Additional Comments: A device lead is visualized.   LEFT VENTRICLE PLAX 2D LVIDd:         5.40 cm LVIDs:  4.90 cm LV PW:         1.20 cm LV IVS:        1.20 cm  RIGHT VENTRICLE            IVC RV Basal diam:  5.00 cm    IVC diam: 1.50 cm RV S prime:     8.20 cm/s TAPSE (M-mode): 1.8 cm LEFT ATRIUM             Index        RIGHT ATRIUM           Index LA diam:        6.10 cm 2.83 cm/m   RA Area:     19.00 cm LA Vol (A2C):   89.2 ml 41.37 ml/m  RA Volume:   61.40 ml  28.48 ml/m LA Vol (A4C):   87.8 ml 40.72 ml/m LA Biplane Vol: 89.2 ml 41.37 ml/m  AORTIC VALVE AV Vmax:           157.60 cm/s AV Vmean:          109.880 cm/s AV VTI:            0.260 m AV Peak Grad:      9.9 mmHg AV Mean Grad:      5.8 mmHg LVOT Vmax:         47.20 cm/s LVOT Vmean:        30.200 cm/s LVOT VTI:          0.078 m LVOT/AV VTI ratio: 0.30  AORTA Ao Root diam: 4.20 cm Ao Asc diam:  4.30 cm MV E velocity: 108.15 cm/s  TRICUSPID VALVE                             TR Peak grad:   19.0 mmHg                             TR Vmax:        218.00 cm/s                              SHUNTS                             Systemic VTI: 0.08 m Skeet Latch MD Electronically signed by Skeet Latch MD Signature Date/Time: 02/19/2022/4:04:13 PM    Final     EKG: on arrival showed a WCT at 120 bpm, felt to be AFL by device interrogation (personally reviewed)  TELEMETRY: AV dual paced currently, QRS ~ 140 ms. AFL prior to [facility] today. (personally reviewed)  DEVICE HISTORY: Engineer, agricultural PPM implanted 02/12/2021 for tachy-brady    Assessment/Plan: 1.  Atypical atrial flutter S/p [facility] this am.  Feeling better Continue IV amiodarone load.  Continue eliquis 2.5 mg BID. He has tolerated for 6 weeks thus far.  ? If would be candidate for [facility]man if his HF can be adequately controlled.   2. HFrEF EF down to 20-25% by Echo 02/19/2022. BNP elevated on arrival but does not appear markedly volume overloaded. Per primary team  3. S/p TAVR Stable by recent Echo  4. CKD 4 S/p  surgical R nephrectomy and partial left nephrectomy for urothelial CA Baseline Cr 2.7 - 3.0 No hematuria currently  EP will see as  needed while here  For questions or updates, please contact [place] Please consult [email or web address] for contact info under Cardiology/STEMI.  Jacalyn Lefevre, PA-C  03/12/2022 2:28 PM

## 2022-03-12 NOTE — Interval H&P Note (Signed)
History and Physical Interval Note:  03/12/2022 8:34 AM  Jordan Oconnell  has presented today for surgery, with the diagnosis of afib.  The various methods of treatment have been discussed with the patient and family. After consideration of risks, benefits and other options for treatment, the patient has consented to  Procedure(s): CARDIOVERSION (N/A) as a surgical intervention.  The patient's history has been reviewed, patient examined, no change in status, stable for surgery.  I have reviewed the patient's chart and labs.  Questions were answered to the patient's satisfaction.     Bryanah Sidell

## 2022-03-12 NOTE — Progress Notes (Signed)
[facility] PROGRESS NOTE  Jordan Oconnell (DOB: 07/08/1940) [id number] PCP: Burnard Bunting, MD  Brief Narrative: Jordan Oconnell is an 82 y.o. male with a history of PAF/flutter with recent DCCV 03/03/21, tachy-brady s/p PPM, HFrEF (likely tachy-induced) with LEV 25% on 1/24, AS s/p TAVR, HTN, CKD stage 4, RCC s/p left nephrectomy and urothelial carcinoma in R kidney who has had several weeks of worsening fatigue, exertional limitations who presented to cardiology clinic 2/15 and subsequently referred to the ED when device interrogation confirmed atrial flutter with RVR. In the ED, ECG and interrogation confirmed Atrial flutter with 2:1 conduction with RVR, Tn 152 > 133, BNP 3,602, hgb 10 (previously 9.5), SCr 3.08 (previously 2.83). CT chest/abd/pelvis showed small pleural effusions, stranding in right renal pelvis, large anterior abdominal hernia, aortic atherosclerosis, mediastinal lymphadenopathy. Cardiology was consulted, IV amiodarone initiated and the patient was admitted by the hospitalist team. DCCV performed 2/16.   Subjective: This morning prior to cardioversion, patient reports wanting water, no chest pain or dyspnea.   Objective: BP 113/77   Pulse 80   Temp (!) 96.4 F (35.8 C) (Temporal)   Resp 20   Ht 6' 1"$  (1.854 m)   Wt 83 kg   SpO2 93%   BMI 24.14 kg/m   Gen: No distress Pulm: Clear, nonlabored but tachypneic when awake. When walking into room and he's sleeping, respirations normal rate and effort. CV: Rapid irreg, wide QRS on monitor, soft early systolic murmur at USB, no RG, no edema GI: Soft, NT, ND, +BS  Neuro: Alert and oriented. No new focal deficits. Ext: Warm, no deformities. Skin: No rashes, lesions or ulcers on visualized skin   Assessment & Plan: Atrial flutter/fibrillation with RVR, tachy-brady s/p dual chamber PPM: Recurrent. Had DCCV 2/7 with subsequent reversion to AFib.  - Continue IV amiodarone load per cardiology. Now in NSR after DCCV  this AM, will monitor on telemetry.  - Continue uninterrupted eliquis - K noted to be 4.7, Mg 2.5.   Chronic HFrEF, AS s/p TAVR: LVEF 25%, normal prosthetic function by recent echo. Appears euvolemic.  - Suspected to be tachycardia-mediated. Not on diuretic at this time. No ACE/ARB/ARNI due to advanced CKD, holding metoprolol with soft BP currently.Marland Kitchen   CAD with demand myocardial ischemia: No chest pain. Troponin mildly elevated, not consistent with ACS. No inpatient work up planned.   Hypothyroidism: TSH 4.609 (ULN for this assay is 4.5),  - Continue synthroid at home dose. Fatigue could be caused by suptherapeutic thyroid replacement, though TSH would likely be much higher for this to be considered a primary cause. Regardless, not planning on increasing synthroid dose in the setting of tachyarrhythmia. - Recheck TFTs outside scope of hospitalization. TSH 3.95 at [facility] earlier this month and normal on last 6 monthly checks.  Stage IV CKD, hx partial left nephrectomy, ureterectomy, cystectomy with ileostomy, NAGMA: Remains near baseline.  - Avoid nephrotoxins, renally dose medications including eliquis.  - Continue sodium bicarbonate - Continue outpatient follow up with [facility], Dr. [name]   Urothelial carcinoma in R kidney: There has been proposal of resection of remaining kidney/ureter which would require initiation of HD. A decision has not yet been made.  - CT abd/pelvis included in admission work up as the patient was going to miss scheduled exam as outpatient due to admission. This has been completed. The patient requests that his PCP, Dr. Reynaldo Oconnell, and [facility] oncology be the ones to discuss next steps with him.   - On  Beryle Flock, an additional cause of fatigue suspected for this patient.  Iron deficiency anemia: Microcytic, stable. No gross bleeding reported. Suspect an element of AOCKD as well.  - Check anemia panel.  - Continue po iron  Thrombocytopenia: Chronic.  -  Monitor  Patrecia Pour, MD [facility] [email or web address] 03/12/2022, 1:12 PM

## 2022-03-13 DIAGNOSIS — N184 Chronic kidney disease, stage 4 (severe): Secondary | ICD-10-CM | POA: Diagnosis not present

## 2022-03-13 DIAGNOSIS — I5043 Acute on chronic combined systolic (congestive) and diastolic (congestive) heart failure: Secondary | ICD-10-CM | POA: Diagnosis not present

## 2022-03-13 DIAGNOSIS — N179 Acute kidney failure, unspecified: Secondary | ICD-10-CM

## 2022-03-13 DIAGNOSIS — I5023 Acute on chronic systolic (congestive) heart failure: Secondary | ICD-10-CM | POA: Diagnosis present

## 2022-03-13 DIAGNOSIS — I4891 Unspecified atrial fibrillation: Secondary | ICD-10-CM | POA: Diagnosis not present

## 2022-03-13 HISTORY — DX: Acute kidney failure, unspecified: N17.9

## 2022-03-13 HISTORY — DX: Acute on chronic systolic (congestive) heart failure: I50.23

## 2022-03-13 LAB — MAGNESIUM: Magnesium: 2.1 mg/dL (ref 1.7–2.4)

## 2022-03-13 LAB — CBC
HCT: 31 % — ABNORMAL LOW (ref 39.0–52.0)
Hemoglobin: 9 g/dL — ABNORMAL LOW (ref 13.0–17.0)
MCH: 25.1 pg — ABNORMAL LOW (ref 26.0–34.0)
MCHC: 29 g/dL — ABNORMAL LOW (ref 30.0–36.0)
MCV: 86.4 fL (ref 80.0–100.0)
Platelets: 87 10*3/uL — ABNORMAL LOW (ref 150–400)
RBC: 3.59 MIL/uL — ABNORMAL LOW (ref 4.22–5.81)
RDW: 21.2 % — ABNORMAL HIGH (ref 11.5–15.5)
WBC: 14.7 10*3/uL — ABNORMAL HIGH (ref 4.0–10.5)
nRBC: 0 % (ref 0.0–0.2)

## 2022-03-13 LAB — FOLATE: Folate: 12.9 ng/mL (ref >5.9)

## 2022-03-13 LAB — BASIC METABOLIC PANEL
Anion gap: 9 (ref 5–15)
BUN: 66 mg/dL — ABNORMAL HIGH (ref 8–23)
CO2: 20 mmol/L — ABNORMAL LOW (ref 22–32)
Calcium: 8.6 mg/dL — ABNORMAL LOW (ref 8.9–10.3)
Chloride: 109 mmol/L (ref 98–111)
Creatinine, Ser: 3.13 mg/dL — ABNORMAL HIGH (ref 0.61–1.24)
GFR, Estimated: 19 mL/min — ABNORMAL LOW (ref >60)
Glucose, Bld: 96 mg/dL (ref 70–99)
Potassium: 4.2 mmol/L (ref 3.5–5.1)
Sodium: 138 mmol/L (ref 135–145)

## 2022-03-13 LAB — RETICULOCYTES
Immature Retic Fract: 37.3 % — ABNORMAL HIGH (ref 2.3–15.9)
RBC.: 3.55 MIL/uL — ABNORMAL LOW (ref 4.22–5.81)
Retic Count, Absolute: 121.8 10*3/uL (ref 19.0–186.0)
Retic Ct Pct: 3.4 % — ABNORMAL HIGH (ref 0.4–3.1)

## 2022-03-13 LAB — FERRITIN: Ferritin: 29 ng/mL (ref 24–336)

## 2022-03-13 LAB — BRAIN NATRIURETIC PEPTIDE: B Natriuretic Peptide: 1669.7 pg/mL — ABNORMAL HIGH (ref 0.0–100.0)

## 2022-03-13 LAB — VITAMIN B12: Vitamin B-12: 220 pg/mL (ref 180–914)

## 2022-03-13 LAB — IRON AND TIBC
Iron: 18 ug/dL — ABNORMAL LOW (ref 45–182)
Saturation Ratios: 6 % — ABNORMAL LOW (ref 17.9–39.5)
TIBC: 326 ug/dL (ref 250–450)
UIBC: 308 ug/dL

## 2022-03-13 MED ORDER — VITAMIN B-12 1000 MCG PO TABS
1000.0000 ug | ORAL_TABLET | Freq: Every day | ORAL | Status: DC
  Administered 2022-03-13 – 2022-03-22 (×10): 1000 ug via ORAL
  Filled 2022-03-13 (×10): qty 1

## 2022-03-13 NOTE — Evaluation (Signed)
Physical Therapy Evaluation Patient Details Name: Jordan Oconnell MRN: [id number] DOB: 18-Jan-1941 Today's Date: 03/13/2022  History of Present Illness  82 y.o. male with chronic systolic and diastolic heart failure, aortic stenosis status post TAVR, sick sinus syndrome status post dual-chamber pacemaker, urothelial cancer, CKD 4, and paroxysmal atrial fibrillation/flutter admitted with heart failure and atrial flutter.  Clinical Impression  Patient presents with decreased mobility from baseline, but improved from when in RVR.  Still somewhat SOB and on O2 currently, but able to walk hallways (with EVA walker at his request) and no desaturation or LOB.  Lives alone and has a partner who lives close.  Feel he should be able to return home at d/c likely without follow up PT needs.  Will continue to follow in acute setting.      Recommendations for follow up therapy are one component of a multi-disciplinary discharge planning process, led by the attending physician.  Recommendations may be updated based on patient status, additional functional criteria and insurance authorization.  Follow Up Recommendations No PT follow up      Assistance Recommended at Discharge Intermittent Supervision/Assistance  Patient can return home with the following  Assistance with cooking/housework;Help with stairs or ramp for entrance    Equipment Recommendations None recommended by PT  Recommendations for Other Services       Functional Status Assessment Patient has had a recent decline in their functional status and demonstrates the ability to make significant improvements in function in a reasonable and predictable amount of time.     Precautions / Restrictions Precautions Precautions: Fall Restrictions Weight Bearing Restrictions: No      Mobility  Bed Mobility Overal bed mobility: Modified Independent                  Transfers   Equipment used: None Transfers: Sit to/from Stand Sit  to Stand: Supervision           General transfer comment: assist for safety and lines    Ambulation/Gait Ambulation/Gait assistance: Supervision Gait Distance (Feet): 400 Feet Assistive device: Ethelene Hal Gait Pattern/deviations: Step-through pattern       General Gait Details: moving well with Eva walker at pt request no LOB  Stairs            Wheelchair Mobility    Modified Rankin (Stroke Patients Only)       Balance Overall balance assessment: Needs assistance   Sitting balance-Leahy Scale: Good       Standing balance-Leahy Scale: Fair                               Pertinent Vitals/Pain Pain Assessment Pain Assessment: No/denies pain    Home Living Family/patient expects to be discharged to:: Private residence Living Arrangements: Alone   Type of Home: House Home Access: Level entry       Home Layout: One level Home Equipment: Grab bars - tub/shower;Grab bars - toilet;Cane - single point      Prior Function Prior Level of Function : Independent/Modified Independent               ADLs Comments: Pt. was performing ADLs and IADL:s without assit.     Hand Dominance   Dominant Hand: Right    Extremity/Trunk Assessment   Upper Extremity Assessment Upper Extremity Assessment: Defer to OT evaluation    Lower Extremity Assessment Lower Extremity Assessment: Overall WFL for tasks assessed  Communication   Communication: No difficulties  Cognition Arousal/Alertness: Awake/alert Behavior During Therapy: WFL for tasks assessed/performed Overall Cognitive Status: Within Functional Limits for tasks assessed                                          General Comments General comments (skin integrity, edema, etc.): VSS though on 3L O2 during ambulation    Exercises     Assessment/Plan    PT Assessment Patient needs continued PT services  PT Problem List Decreased strength;Decreased  balance;Decreased cognition;Decreased activity tolerance;Decreased mobility;Decreased knowledge of use of DME       PT Treatment Interventions DME instruction;Functional mobility training;Balance training;Patient/family education;Gait training;Therapeutic activities;Therapeutic exercise    PT Goals (Current goals can be found in the Care Plan section)  Acute Rehab PT Goals Patient Stated Goal: to return to independent and walking for exercise PT Goal Formulation: With patient Time For Goal Achievement: 03/26/22 Potential to Achieve Goals: Good    Frequency Min 3X/week     Co-evaluation               AM-PAC PT "6 Clicks" Mobility  Outcome Measure Help needed turning from your back to your side while in a flat bed without using bedrails?: None Help needed moving from lying on your back to sitting on the side of a flat bed without using bedrails?: None Help needed moving to and from a bed to a chair (including a wheelchair)?: None Help needed standing up from a chair using your arms (e.g., wheelchair or bedside chair)?: None Help needed to walk in hospital room?: A Little Help needed climbing 3-5 steps with a railing? : Total 6 Click Score: 20    End of Session Equipment Utilized During Treatment: Gait belt Activity Tolerance: Patient tolerated treatment well Patient left: in bed   PT Visit Diagnosis: Other abnormalities of gait and mobility (R26.89)    Time: [id number] PT Time Calculation (min) (ACUTE ONLY): 24 min   Charges:   PT Evaluation $PT Eval Moderate Complexity: 1 Mod PT Treatments $Gait Training: 8-22 mins        Magda Kiel, PT Acute Rehabilitation Services Office:[phone] 03/13/2022   [name] 03/13/2022, 2:28 PM

## 2022-03-13 NOTE — Progress Notes (Signed)
[facility] PROGRESS NOTE  Jordan Oconnell (DOB: 03-03-1940) [id number] PCP: Burnard Bunting, Jordan Oconnell  Brief Narrative: Jordan Oconnell is an 82 y.o. male with a history of PAF/flutter with recent DCCV 03/03/21, tachy-brady s/p PPM, HFrEF (likely tachy-induced) with LEV 25% on 1/24, AS s/p TAVR, HTN, CKD stage 4, RCC s/p left nephrectomy and urothelial carcinoma in R kidney who has had several weeks of worsening fatigue, exertional limitations who presented to cardiology clinic 2/15 and subsequently referred to the ED when device interrogation confirmed atrial flutter with RVR. In the ED, ECG and interrogation confirmed Atrial flutter with 2:1 conduction with RVR, Tn 152 > 133, BNP 3,602, hgb 10 (previously 9.5), SCr 3.08 (previously 2.83). CT chest/abd/pelvis showed small pleural effusions, stranding in right renal pelvis, large anterior abdominal hernia, aortic atherosclerosis, mediastinal lymphadenopathy. Cardiology was consulted, IV amiodarone initiated and the patient was admitted by the hospitalist team. DCCV performed 2/16. Respiratory status worsened, CXR revealing increased pulmonary edema for which lasix was given. IV amiodarone load continues.   Subjective: In good spirits, ready to get up with PT. Says he got choked with water which is unusual, had trouble breathing last night. Thinks he made more urine after lasix, but bag fell off, took some time to get replacement.  Objective: BP 102/64 (BP Location: Right Arm)   Pulse 74   Temp 97.7 F (36.5 C) (Oral)   Resp (!) 21   Ht 6' 1"$  (1.854 m)   Wt 83 kg   SpO2 98%   BMI 24.14 kg/m   Gen: Elderly, pleasant male in no distress Pulm: Clear, nonlabored  CV: RRR, NSR on monitor, +early systolic murmur, no JVD or edema GI: Soft, NT, ND, +BS. RLQ ileostomy c/d/I. Neuro: Alert and oriented. No new focal deficits. Ext: Warm, no deformities Skin: No new rashes, lesions or ulcers on visualized skin   Assessment & Plan: Atrial  flutter/fibrillation with RVR, tachy-brady s/p dual chamber PPM: Recurrent. Had DCCV 2/7 with subsequent reversion to AFib.  - Continue IV amiodarone load per cardiology. Now in NSR after DCCV this AM, will monitor on telemetry.  - Continue uninterrupted eliquis - K, Mg replete, TSH as below.  Acute hypoxic respiratory failure: Likely from pulmonary edema, though aspiration pneumonitis is a concomitant possibility. RVP neg.  - SLP evaluation, aspiration precautions regardless. - Trend leukocytosis in AM, check PCT. Monitor off abx unless fevers or worsens.  - Wean O2 as tolerated  Acute on chronic HFrEF, AS s/p TAVR: LVEF 25%, normal prosthetic function by recent echo. Appears euvolemic.  - Suspected to be tachycardia-mediated. Not on diuretic at this time. No ACE/ARB/ARNI due to advanced CKD, holding metoprolol with soft BP currently. Given lasix 79m IV x1 2/16. BNP down, respiratory status stabilized  CAD with demand myocardial ischemia: No chest pain. Troponin mildly elevated, not consistent with ACS. No inpatient work up planned.   Hypothyroidism: TSH 4.609 (ULN for this assay is 4.5),  - Continue synthroid at home dose. Fatigue could be caused by suptherapeutic thyroid replacement, though TSH would likely be much higher for this to be considered a primary cause. Regardless, not planning on increasing synthroid dose in the setting of tachyarrhythmia. - Recheck TFTs outside scope of hospitalization. TSH 3.95 at [facility] earlier this month and normal on last 6 monthly checks.  Stage IV CKD, hx partial left nephrectomy, ureterectomy, cystectomy with ileostomy, NAGMA: Remains near baseline.  - Avoid nephrotoxins, renally dose medications including eliquis.  - Continue sodium bicarbonate - Continue outpatient  follow up with [place] [facility], Dr. [name] - SCr stable 3.08 > 3.06 > 3.13 (GFR 20).   Urothelial carcinoma in R kidney: There has been proposal of resection of remaining kidney/ureter  which would require initiation of HD. A decision has not yet been made.  - CT abd/pelvis included in admission work up as the patient was going to miss scheduled exam as outpatient due to admission. This has been completed. The patient requests that his PCP, Dr. Reynaldo Minium, and [facility] oncology be the ones to discuss next steps with him.   - On keytruda, an additional cause of fatigue suspected for this patient.  Iron deficiency anemia: Microcytic, stable. No gross bleeding reported. Suspect an element of AOCKD as well.  - Continue po iron. Iron low at 18, 6% sat, ferritin 29. B12 220 so will supp.   Thrombocytopenia: Chronic.  - Monitor  Jordan Pour, Jordan Oconnell [facility] [email or web address] 03/13/2022, 1:18 PM

## 2022-03-13 NOTE — Evaluation (Signed)
Clinical/Bedside Swallow Evaluation Patient Details  Name: Jordan Oconnell MRN: [id number] Date of Birth: 06-18-1940  Today's Date: 03/13/2022 Time: SLP Start Time (ACUTE ONLY): 32 SLP Stop Time (ACUTE ONLY): 1102 SLP Time Calculation (min) (ACUTE ONLY): 15 min  Past Medical History:  Past Medical History:  Diagnosis Date   Bladder cancer ([place])    BPH (benign prostatic hyperplasia)    CKD (chronic kidney disease), stage IV (HCC)    Colon polyps    Diverticulosis    GERD (gastroesophageal reflux disease)    Hyperlipidemia    pt denies   Hypertension    PAF (paroxysmal atrial fibrillation) ([place])    on Flecanide and diltiazem. No OAC given recurrent hematuria   Renal cell carcinoma ([place]) 2008   left   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 Burt Knack and Dr. Cyndia Bent   Severe aortic stenosis 10/05/2020   Past Surgical History:  Past Surgical History:  Procedure Laterality Date   APPENDECTOMY     BLADDER SURGERY  2008   transurethral resection/resection of prostatic urethra   BOWEL RESECTION     CARDIOVERSION N/A 03/03/2022   Procedure: CARDIOVERSION;  Surgeon: Buford Dresser, MD;  Location[facility] ENDOSCOPY;  Service: Cardiovascular;  Laterality: N/A;   ESOPHAGOGASTRODUODENOSCOPY N/A 07/24/2013   Procedure: ESOPHAGOGASTRODUODENOSCOPY (EGD);  Surgeon: [name], MD;  Location: [name] ENDOSCOPY;  Service: Endoscopy;  Laterality: N/A;   INTRAOPERATIVE TRANSTHORACIC ECHOCARDIOGRAM N/A 01/27/2021   Procedure: INTRAOPERATIVE TRANSTHORACIC ECHOCARDIOGRAM;  Surgeon: Sherren Mocha, MD;  Location: [place] CV LAB;  Service: Open Heart Surgery;  Laterality: N/A;   laparoscopic surgery  2012   laser   (?)   NEPHRECTOMY  2007   partial, left   NEPHROSTOMY  2011   stent   NM MYOCAR PERF WALL MOTION  09/08/2010   Normal   PACEMAKER IMPLANT N/A 02/12/2021   Procedure: PACEMAKER IMPLANT;  Surgeon:  Vickie Epley, MD;  Location: [place] CV LAB;  Service: Cardiovascular;  Laterality: N/A;   RIGHT HEART CATH AND CORONARY ANGIOGRAPHY N/A 10/06/2020   Procedure: RIGHT HEART CATH AND CORONARY ANGIOGRAPHY;  Surgeon: Sherren Mocha, MD;  Location: [place] CV LAB;  Service: Cardiovascular;  Laterality: N/A;   ROBOT ASSISTED LAPAROSCOPIC COMPLETE CYSTECT ILEAL CONDUIT     TRANSCATHETER AORTIC VALVE REPLACEMENT, TRANSFEMORAL Right 01/27/2021   Procedure: TRANSCATHETER AORTIC VALVE REPLACEMENT, TRANSFEMORAL;  Surgeon: Sherren Mocha, MD;  Location: [place] CV LAB;  Service: Open Heart Surgery;  Laterality: Right;   US ECHOCARDIOGRAPHY  0000000   mild diastolic dysfunction,mild dilated LA,mild MR,AI,mildly dilated aortic root   HPI:  Jordan Oconnell is an 82 y.o. male with a history of PAF/flutter with recent DCCV 03/03/21, tachy-brady s/p PPM, HFrEF (likely tachy-induced) with LEV 25% on 1/24, AS s/p TAVR, HTN, CKD stage 4, RCC s/p left nephrectomy and urothelial carcinoma in R kidney who has had several weeks of worsening fatigue, exertional limitations who presented to cardiology clinic 2/15 and subsequently referred to the ED when device interrogation confirmed atrial flutter with RVR. In the ED, ECG and interrogation confirmed Atrial flutter.  CT chest/abd/pelvis showed small pleural effusions, stranding in right renal pelvis, large anterior abdominal hernia, aortic atherosclerosis, mediastinal lymphadenopathy.  DCCV performed 2/16.    Assessment / Plan / Recommendation  Clinical Impression  Pt was seen for a bedside swallow evaluation and he presents with suspected mild oropharyngeal dysphagia.  Oral mechanism  examination was WNL.  Pt was seen with trials of thin liquid, puree, and regular solids.  He was able to feed himself independently.  He exhibited mildly prolonged mastication of regular solids and required water to soften bolus prior to swallow initiation.  No overt s/sx of  aspiration were observed with PO trials; however, RR was noted to consistently increase to >30 with all PO intake, placing pt at an increased risk for aspiration with all consistencies.  Pt was educated regarding aspiration precautions including not eating/drink when SOB and taking small, single sips and bites.  Pt verbalized understanding with teach back.  Recommend diet change to Dysphagia 3 (soft) solids and thin liquids with medication administered whole in puree and strict adherence to aspiration precautions.  SLP will f/u to monitor diet tolerance.  SLP Visit Diagnosis: Dysphagia, unspecified (R13.10)    Aspiration Risk  Mild aspiration risk    Diet Recommendation Dysphagia 3 (Mech soft);Thin liquid   Liquid Administration via: Cup;Straw Medication Administration: Whole meds with puree Supervision: Patient able to self feed Compensations: Slow rate;Small sips/bites (Do not eat/drink if SOB) Postural Changes: Seated upright at 90 degrees    Other  Recommendations Oral Care Recommendations: Oral care BID    Recommendations for follow up therapy are one component of a multi-disciplinary discharge planning process, led by the attending physician.  Recommendations may be updated based on patient status, additional functional criteria and insurance authorization.  Follow up Recommendations No SLP follow up      Assistance Recommended at Discharge    Functional Status Assessment Patient has had a recent decline in their functional status and demonstrates the ability to make significant improvements in function in a reasonable and predictable amount of time.  Frequency and Duration min 2x/week          Prognosis Prognosis for improved oropharyngeal function: Good      Swallow Study   General Date of Onset: 03/12/22 HPI: Jordan Oconnell is an 82 y.o. male with a history of PAF/flutter with recent DCCV 03/03/21, tachy-brady s/p PPM, HFrEF (likely tachy-induced) with LEV 25% on 1/24,  AS s/p TAVR, HTN, CKD stage 4, RCC s/p left nephrectomy and urothelial carcinoma in R kidney who has had several weeks of worsening fatigue, exertional limitations who presented to cardiology clinic 2/15 and subsequently referred to the ED when device interrogation confirmed atrial flutter with RVR. In the ED, ECG and interrogation confirmed Atrial flutter.  CT chest/abd/pelvis showed small pleural effusions, stranding in right renal pelvis, large anterior abdominal hernia, aortic atherosclerosis, mediastinal lymphadenopathy.  DCCV performed 2/16. Type of Study: Bedside Swallow Evaluation Previous Swallow Assessment: N/A Diet Prior to this Study: Regular;Thin liquids (Level 0) Temperature Spikes Noted: No Respiratory Status: Nasal cannula History of Recent Intubation: No Behavior/Cognition: Alert;Cooperative;Pleasant mood Oral Cavity Assessment: Within Functional Limits Oral Care Completed by SLP: No Oral Cavity - Dentition: Adequate natural dentition;Poor condition Vision: Functional for self-feeding Self-Feeding Abilities: Able to feed self Patient Positioning: Upright in bed Baseline Vocal Quality: Normal Volitional Swallow: Able to elicit    Oral/Motor/Sensory Function Overall Oral Motor/Sensory Function: Within functional limits   Ice Chips Ice chips: Not tested   Thin Liquid Thin Liquid: Within functional limits    Nectar Thick Nectar Thick Liquid: Not tested   Honey Thick Honey Thick Liquid: Not tested   Puree Puree: Within functional limits   Solid     Solid: Impaired Presentation: Self Fed Oral Phase Impairments: Impaired mastication     Bretta Bang,  M.S., [place] Office: [phone]  [place] 03/13/2022,11:18 AM

## 2022-03-13 NOTE — Evaluation (Signed)
Occupational Therapy Evaluation Patient Details Name: Jordan Oconnell MRN: [id number] DOB: 26-Mar-1940 Today's Date: 03/13/2022   History of Present Illness 82 y.o. male with chronic systolic and diastolic heart failure, aortic stenosis status post TAVR, sick sinus syndrome status post dual-chamber pacemaker, urothelial cancer, CKD 4, and paroxysmal atrial fibrillation/flutter admitted with heart failure and atrial flutter.   Clinical Impression   Pt. Was seen for OT evaluation to assess for needs. Discussed with pt. That he is very easily sob and need for energy conservation. Pt. Ed on sitting down to perform tasks and that having a shower seat would be beneficial and offered to order him one. Pt. Declined the ordering of seat but states he will think about it. Pt. Ed on use of long handled shower head for bathing and he will look into that as well. Pt. Would benefit from further OT to maximize function and pt. Is an agreement.       Recommendations for follow up therapy are one component of a multi-disciplinary discharge planning process, led by the attending physician.  Recommendations may be updated based on patient status, additional functional criteria and insurance authorization.   Follow Up Recommendations  Follow physician's recommendations for discharge plan and follow up therapies     Assistance Recommended at Discharge Intermittent Supervision/Assistance  Patient can return home with the following A little help with bathing/dressing/bathroom;A little help with walking and/or transfers;Assistance with cooking/housework    Functional Status Assessment  Patient has had a recent decline in their functional status and demonstrates the ability to make significant improvements in function in a reasonable and predictable amount of time.  Equipment Recommendations   (ed pt on shower seat and hand held shower head and he said he will look into it.)    Recommendations for Other Services        Precautions / Restrictions Precautions Precautions: Fall Restrictions Weight Bearing Restrictions: No      Mobility Bed Mobility Overal bed mobility: Modified Independent                  Transfers Overall transfer level: Needs assistance Equipment used: Rolling walker (2 wheels) Transfers: Sit to/from Stand Sit to Stand: Min guard           General transfer comment: Pt. with decreased control with decent      Balance Overall balance assessment: Needs assistance   Sitting balance-Leahy Scale: Good       Standing balance-Leahy Scale: Fair                             ADL either performed or assessed with clinical judgement   ADL Overall ADL's : Needs assistance/impaired Eating/Feeding: Independent   Grooming: Wash/dry hands;Wash/dry face;Supervision/safety;Standing   Upper Body Bathing: Set up;Sitting   Lower Body Bathing: Supervison/ safety;Sit to/from stand   Upper Body Dressing : Set up;Sitting   Lower Body Dressing: Set up;Supervision/safety;Sit to/from stand   Toilet Transfer: Min guard;Ambulation;Comfort height toilet   Toileting- Clothing Manipulation and Hygiene: Min guard;Sit to/from stand       Functional mobility during ADLs: Min guard;Rolling walker (2 wheels) General ADL Comments: Pt. was able to demo ability to perform ADLs sitting eob.     Vision Baseline Vision/History: 1 Wears glasses;6 Macular Degeneration Ability to See in Adequate Light: 0 Adequate Patient Visual Report: No change from baseline Vision Assessment?: No apparent visual deficits     Perception  Praxis      Pertinent Vitals/Pain       Hand Dominance Right   Extremity/Trunk Assessment Upper Extremity Assessment Upper Extremity Assessment: Overall WFL for tasks assessed;Difficult to assess due to impaired cognition   Lower Extremity Assessment Lower Extremity Assessment: Defer to PT evaluation       Communication  Communication Communication: No difficulties   Cognition Arousal/Alertness: Awake/alert Behavior During Therapy: WFL for tasks assessed/performed Overall Cognitive Status: Within Functional Limits for tasks assessed                                       General Comments  Pt. ed on use of shower seat for when he goes home for safety and energy conseration. Pt. is SOB with minimal activity.    Exercises     Shoulder Instructions      Home Living Family/patient expects to be discharged to:: Private residence Living Arrangements: Alone   Type of Home: House Home Access: Level entry     Home Layout: One level     Bathroom Shower/Tub: Occupational psychologist: Handicapped height     Home Equipment: Grab bars - tub/shower;Grab bars - toilet;Cane - single point          Prior Functioning/Environment Prior Level of Function : Independent/Modified Independent               ADLs Comments: Pt. was performing ADLs and IADL:s without assit.        OT Problem List: Decreased activity tolerance;Impaired balance (sitting and/or standing);Decreased knowledge of use of DME or AE      OT Treatment/Interventions: Self-care/ADL training;Energy conservation;DME and/or AE instruction;Therapeutic activities;Patient/family education    OT Goals(Current goals can be found in the care plan section) Acute Rehab OT Goals Patient Stated Goal: go home. OT Goal Formulation: With patient Time For Goal Achievement: 03/27/22 Potential to Achieve Goals: Good ADL Goals Pt Will Perform Lower Body Bathing: with modified independence;sit to/from stand Pt Will Perform Lower Body Dressing: with modified independence;sit to/from stand Pt Will Transfer to Toilet: with modified independence;ambulating Pt Will Perform Toileting - Clothing Manipulation and hygiene: with modified independence;sit to/from stand Pt Will Perform Tub/Shower Transfer: Shower transfer;with modified  independence;ambulating;shower seat  OT Frequency: Min 2X/week    Co-evaluation              AM-PAC OT "6 Clicks" Daily Activity     Outcome Measure Help from another person eating meals?: None Help from another person taking care of personal grooming?: A Little Help from another person toileting, which includes using toliet, bedpan, or urinal?: A Little Help from another person bathing (including washing, rinsing, drying)?: A Little Help from another person to put on and taking off regular upper body clothing?: A Little Help from another person to put on and taking off regular lower body clothing?: A Little 6 Click Score: 19   End of Session Equipment Utilized During Treatment: Rolling walker (2 wheels) Nurse Communication:  (ok therapy)  Activity Tolerance: Patient tolerated treatment well Patient left: in bed;with call bell/phone within reach;with bed alarm set (Pt. requested all bed rails be put up.)  OT Visit Diagnosis: Unsteadiness on feet (R26.81);Other abnormalities of gait and mobility (R26.89)                Time: [id number] OT Time Calculation (min): 30 min Charges:  OT General Charges $OT Visit:  1 Visit OT Evaluation $OT Eval Moderate Complexity: 1 Mod  Reece Packer OT/L   Aashish Hamm 03/13/2022, 1:10 PM

## 2022-03-13 NOTE — Anesthesia Postprocedure Evaluation (Addendum)
Anesthesia Post Note  Patient: [name]  Procedure(s) Performed: CARDIOVERSION     Patient location during evaluation: Endoscopy Anesthesia Type: General Level of consciousness: awake and alert Pain management: pain level controlled Vital Signs Assessment: post-procedure vital signs reviewed and stable Respiratory status: spontaneous breathing, nonlabored ventilation and respiratory function stable Cardiovascular status: stable Postop Assessment: no apparent nausea or vomiting Anesthetic complications: no   No notable events documented.  Last Vitals:  Vitals:   03/13/22 0721 03/13/22 1118  BP: 102/64   Pulse: 70 74  Resp: (!) 21 (!) 21  Temp: 36.5 C   SpO2: 95% 98%    Last Pain:  Vitals:   03/13/22 1118  TempSrc:   PainSc: 0-No pain                 Almarie Kurdziel

## 2022-03-13 NOTE — Progress Notes (Addendum)
Rounding Note    Patient Name: Jordan Oconnell Date of Encounter: 03/13/2022  [place] Cardiologist: Sanda Klein, MD   Subjective   Feeling well.  Breathing improving.  Had leaking from urostomy bag overnight.   Inpatient Medications    Scheduled Meds:  ALPRAZolam  0.25 mg Oral QHS   apixaban  2.5 mg Oral BID   vitamin B-12  1,000 mcg Oral Daily   ferrous sulfate  325 mg Oral Q breakfast   latanoprost  1 drop Both Eyes QHS   levothyroxine  50 mcg Oral QHS   sodium bicarbonate  1,300 mg Oral BID   Continuous Infusions:  amiodarone 30 mg/hr (03/13/22 0019)   PRN Meds: alum & mag hydroxide-simeth, levalbuterol, ondansetron **OR** ondansetron (ZOFRAN) IV   Vital Signs    Vitals:   03/12/22 1833 03/12/22 1933 03/13/22 0410 03/13/22 0721  BP:  (!) 95/53 96/64 102/64  Pulse: 83 84 71 70  Resp: (!) 22 (!) 21 (!) 25 (!) 21  Temp: 97.9 F (36.6 C) 97.9 F (36.6 C) 98 F (36.7 C) 97.7 F (36.5 C)  TempSrc: Oral Oral Oral Oral  SpO2: 93% 95% 95% 95%  Weight:      Height:        Intake/Output Summary (Last 24 hours) at 03/13/2022 0743 Last data filed at 03/12/2022 2000 Gross per 24 hour  Intake 1260 ml  Output 550 ml  Net 710 ml      03/12/2022    8:25 AM 03/11/2022    2:41 PM 03/03/2022   11:53 AM  Last 3 Weights  Weight (lbs) 182 lb 15.7 oz 183 lb 188 lb  Weight (kg) 83 kg 83.008 kg 85.276 kg      Telemetry    Sinus rhythm.  NSVT - Personally Reviewed  ECG    N/a - Personally Reviewed  Physical Exam   VS:  BP 102/64 (BP Location: Right Arm)   Pulse 70   Temp 97.7 F (36.5 C) (Oral)   Resp (!) 21   Ht 6' 1"$  (1.854 m)   Wt 83 kg   SpO2 95%   BMI 24.14 kg/m  , BMI Body mass index is 24.14 kg/m. GENERAL:  Well appearing HEENT: Pupils equal round and reactive, fundi not visualized, oral mucosa unremarkable NECK:  JVP at clavicle at 45 degrees. , waveform within normal limits, carotid upstroke brisk and symmetric, no bruits, no  thyromegaly LUNGS:  Clear to auscultation bilaterally HEART:  RRR.  PMI not displaced or sustained,S1 and S2 within normal limits, no S3, no S4, no clicks, no rubs, no murmurs ABD:  Flat, positive bowel sounds normal in frequency in pitch, no bruits, no rebound, no guarding, no midline pulsatile mass, no hepatomegaly, no splenomegaly EXT:  2 plus pulses throughout, no edema, no cyanosis no clubbing SKIN:  No rashes no nodules NEURO:  Cranial nerves II through XII grossly intact, motor grossly intact throughout PSYCH:  Cognitively intact, oriented to person place and time   Labs    High Sensitivity Troponin:   Recent Labs  Lab 03/11/22 1811 03/11/22 2023  TROPONINIHS 152* 133*     Chemistry Recent Labs  Lab 03/11/22 1811 03/11/22 2023 03/12/22 0713 03/13/22 0121  NA 138  --  139 138  K 4.8  --  4.7 4.2  CL 108  --  111 109  CO2 17*  --  16* 20*  GLUCOSE 93  --  107* 96  BUN 68*  --  69* 66*  CREATININE 3.08*  --  3.06* 3.13*  CALCIUM 9.3  --  8.8* 8.6*  MG  --  2.5*  --  2.1  PROT  --   --  6.0*  --   ALBUMIN  --   --  3.3*  --   AST  --   --  17  --   ALT  --   --  16  --   ALKPHOS  --   --  56  --   BILITOT  --   --  1.0  --   GFRNONAA 19*  --  20* 19*  ANIONGAP 13  --  12 9    Lipids No results for input(s): "CHOL", "TRIG", "HDL", "LABVLDL", "LDLCALC", "CHOLHDL" in the last 168 hours.  Hematology Recent Labs  Lab 03/11/22 1811 03/12/22 0713 03/13/22 0121  WBC 7.0 6.3 14.7*  RBC 3.91* 3.77* 3.59*  3.55*  HGB 10.0* 9.6* 9.0*  HCT 34.4* 34.8* 31.0*  MCV 88.0 92.3 86.4  MCH 25.6* 25.5* 25.1*  MCHC 29.1* 27.6* 29.0*  RDW 20.8* 21.1* 21.2*  PLT 109* 98* 87*   Thyroid  Recent Labs  Lab 03/12/22 0032  TSH 4.609*    BNP Recent Labs  Lab 03/11/22 1811  BNP 3,602.4*    DDimer No results for input(s): "DDIMER" in the last 168 hours.   Radiology    DG CHEST PORT 1 VIEW  Result Date: 03/12/2022 CLINICAL DATA:  Tachypnea EXAM: PORTABLE CHEST 1 VIEW  COMPARISON:  03/11/2022 FINDINGS: Two frontal views of the chest demonstrate stable aortic valve prosthesis and dual lead pacer. Cardiac silhouette is enlarged. There is progressive central vascular congestion, with worsening bilateral perihilar airspace disease. Small bilateral pleural effusions. No pneumothorax. IMPRESSION: 1. Findings consistent with congestive heart failure and worsening volume status. Electronically Signed   By: Randa Ngo M.D.   On: 03/12/2022 18:21   CT CHEST ABDOMEN PELVIS WO CONTRAST  Result Date: 03/12/2022 CLINICAL DATA:  Metastatic disease evaluation. Right urothelial cancer and left renal cell cancer. EXAM: CT CHEST, ABDOMEN AND PELVIS WITHOUT CONTRAST TECHNIQUE: Multidetector CT imaging of the chest, abdomen and pelvis was performed following the standard protocol without IV contrast. RADIATION DOSE REDUCTION: This exam was performed according to the departmental dose-optimization program which includes automated exposure control, adjustment of the mA and/or kV according to patient size and/or use of iterative reconstruction technique. COMPARISON:  11/20/2020 FINDINGS: Absence of IV contrast material limits evaluation of solid organs and vascular structures. CT CHEST FINDINGS Cardiovascular: Cardiac enlargement. Aortic valve prosthesis. No pericardial effusions. Cardiac pacemaker. Normal caliber thoracic aorta. Scattered aortic calcification. Mediastinum/Nodes: Scattered mediastinal lymph nodes are present. Largest are in the subcarinal region measuring 1.2 cm short axis dimension. No change since prior study. Thyroid gland is unremarkable. Esophagus is decompressed. Lungs/Pleura: Small bilateral pleural effusions. Motion artifact limits evaluation of the lungs. There is atelectasis in the lung bases, likely compressive. Interlobular septal thickening in the lung apices may indicate fibrosis or edema. No definitive lung nodules. Musculoskeletal: Degenerative changes in the  spine. Old healed fracture deformity of the right clavicle. Postoperative changes in the right shoulder. No destructive bone lesions. CT ABDOMEN PELVIS FINDINGS Hepatobiliary: Scattered subcentimeter low-attenuation lesions as seen on prior study, likely cysts. Cholelithiasis with several stones in the gallbladder. No inflammatory changes. No bile duct dilatation. Pancreas: Unremarkable. No pancreatic ductal dilatation or surrounding inflammatory changes. Spleen: Normal in size without focal abnormality. Adrenals/Urinary Tract: No adrenal gland nodules. Surgical absence of  the left kidney. There is infiltration around the right kidney and proximal right ureter with mild prominence of the extrarenal pelvis. This area corresponds to a mass lesion better seen with contrast-enhanced imaging on the prior study. Due to differences in technique, it is difficult to compare the 2 studies but the area of the lesion looks larger than on the prior study. Bladder is surgically absent. Right lower quadrant ileal conduit is decompressed. Stomach/Bowel: Stomach, small bowel, and colon are not abnormally distended. Postoperative changes suggesting partial colectomy with ileocolonic anastomosis. Scattered diverticula in the colon without any evidence of acute diverticulitis. Large ventral abdominal wall hernia containing multiple small and large bowel loops as well as omentum and mesentery. No proximal obstruction. Vascular/Lymphatic: Diffuse aortic calcification. No abdominal aortic aneurysm. Distal right iliac artery aneurysm measuring 2.4 cm. No significant lymphadenopathy. Reproductive: Prostate gland appears surgically absent. Other: No free air or free fluid in the abdomen. Musculoskeletal: Degenerative changes in the spine. No destructive bone lesions. IMPRESSION: 1. Lack of IV contrast material limits the examination. 2. Small bilateral pleural effusions with basilar atelectasis. No discrete pulmonary nodules are identified.  3. Mediastinal lymphadenopathy measuring up to 1.2 cm. No change since prior study. Nonspecific etiology. 4. Cholelithiasis without evidence of acute cholecystitis. 5. Enlargement of the area of the right renal pelvis with surrounding stranding. This corresponds to an enhancing tumor seen on the prior study although less well demonstrated today without IV contrast material. Correlation is limited due to lack of IV contrast material today but this area may be enlarged. There is surrounding stranding which was not present previously. 6. Surgical absence of the left kidney, bladder, and prostate gland. Right lower quadrant ileal conduit. 7. Large broad-based anterior abdominal wall hernia containing small and large bowel as well as mesentery and omentum. No proximal obstruction. 8. Aortic atherosclerosis. 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 Electronically Signed   By: Lucienne Capers M.D.   On: 03/12/2022 00:33   DG Chest Port 1 View  Result Date: 03/11/2022 CLINICAL DATA:  Shortness of breath. EXAM: PORTABLE CHEST 1 VIEW COMPARISON:  February 13, 2021 FINDINGS: Stable dual lead AICD positioning is seen. The cardiac silhouette is mildly enlarged and unchanged in size. An artificial aortic valve is noted. Low lung volumes are seen with mild areas of atelectasis within the bilateral lung bases, right greater than left. There is no evidence of a pleural effusion or pneumothorax. The visualized skeletal structures are unremarkable. IMPRESSION: Low lung volumes with mild bibasilar atelectasis, right greater than left. Electronically Signed   By: Virgina Norfolk M.D.   On: 03/11/2022 18:40   CUP PACEART INCLINIC DEVICE CHECK  Result Date: 03/11/2022 Pacemaker check in clinic. Normal device function. Unable to perform atrial and ventricular threshold d/t AFl w RVR. Sensing and impedances consistent with previous measurements. Device programmed to maximize longevity. AT/AF 52% burden.  Device programmed at  appropriate safety margins. Ventricular histogram elevated. See OV note for further discussion. Device programmed to optimize intrinsic conduction. Estimated longevity 7.5 years. Patient enrolled in remote follow-up. Patient education completed.   Cardiac Studies   Relevant CV Studies: TTE 02/19/22  1. Systolic dysfunction is new compared with the echo 01/2021.   2. Left ventricular ejection fraction, by estimation, is 20 to 25%. The left ventricle has severely decreased function. The left ventricle demonstrates regional wall motion abnormalities (see scoring diagram/findings for description). There is mild  concentric left ventricular hypertrophy. Left ventricular diastolic function could not be evaluated.  3. Right ventricular systolic function is mildly reduced. The right ventricular size is normal. There is normal pulmonary artery systolic pressure.   4. Left atrial size was severely dilated.   5. The mitral valve is normal in structure. Mild mitral valve regurgitation. No evidence of mitral stenosis.   6. The aortic valve has been repaired/replaced. Aortic valve regurgitation is not visualized. No aortic stenosis is present. There is a 29 mm Sapien prosthetic (TAVR) valve present in the aortic position. Aortic valve mean gradient measures 5.8 mmHg. Aortic valve Vmax measures 1.58 m/s.   7. Aortic dilatation noted. There is moderate dilatation of the ascending aorta, measuring 43 mm.   8. The inferior vena cava is normal in size with greater than 50% respiratory variability, suggesting right atrial pressure of 3 mmHg.    TTE 02/20/21  1. Abnormal septal motion . Left ventricular ejection fraction, by estimation, is 55%. The left ventricle has normal function. The left ventricle has no regional wall motion abnormalities. Left ventricular diastolic parameters were normal.   2. Pacing wires in RA/RV. Right ventricular systolic function is normal. The right ventricular size is normal.   3. Left  atrial size was moderately dilated.   4. The mitral valve is abnormal. Mild mitral valve regurgitation. No evidence of mitral stenosis.   5. Post TAVR with 29 mm Sapien 3 valve no significant PVL gradients have increased since previous echo . The aortic valve has been repaired/replaced. Aortic valve regurgitation is not visualized. No aortic stenosis is present.   6. Aortic dilatation noted. There is moderate dilatation of the ascending aorta, measuring 43 mm.   7. The inferior vena cava is normal in size with greater than 50% respiratory variability, suggesting right atrial pressure of 3 mmHg.   Patient Profile     82 y.o. male with chronic systolic and diastolic heart failure, aortic stenosis status post TAVR, sick sinus syndrome status post dual-chamber pacemaker, urothelial cancer, CKD 4, and paroxysmal atrial fibrillation/flutter admitted with heart failure and atrial flutter.  Assessment & Plan    # Acute on chronic systolic diastolic heart failure: LVEF 20-25%.  He has nonischemic cardiomyopathy likely due to atrial fibrillation and LBBB related dyssynchrony.  BNP was 3602 days ago.  He received only 1 dose of IV Lasix yesterday and renal function is worsening.  Unable to assess his urine output as he had leaking from his urostomy bag.  Will repeat a BNP now.  Chest x-ray yesterday did show volume overload.  Hold off on additional Lasix for now.  # PAF/flutter: Patient presented to EP clinic in 2:1 atrial flutter and decompensated heart failure.  He is now status post cardioversion 03/12/2022, and maintaining sinus rhythm.  Previously off Eliquis in the setting of hematuria but has recently been on it.  He remains on IV amiodarone.  1.3g so far of a 5g load.  Will transition to oral tomorrow.  # CKD 4:  S/p R nephrectomy and partial L nephrectomy for urothelial cancer.  Renal function is worsening today.  BNP was over 3000 on admission.  He received 1 dose of IV Lasix yesterday.  Urine  output difficult to assess due to leaking from the urostomy bag.  Will hold off on additional Lasix today.  He does not seem very volume overloaded and I am concerned about worsening his renal function.  May need to get nephrology involved if renal function continues to go in the wrong direction.  # Iron deficiency anemia:  Continue iron  supplementation.  No active bleeding.        For questions or updates, please contact Jessie Please consult [email or web address] for contact info under        Signed, Skeet Latch, MD  03/13/2022, 7:43 AM

## 2022-03-14 ENCOUNTER — Encounter (HOSPITAL_COMMUNITY): Payer: Self-pay | Admitting: Cardiology

## 2022-03-14 ENCOUNTER — Inpatient Hospital Stay (HOSPITAL_COMMUNITY): Payer: Medicare PPO

## 2022-03-14 DIAGNOSIS — N184 Chronic kidney disease, stage 4 (severe): Secondary | ICD-10-CM

## 2022-03-14 DIAGNOSIS — I4891 Unspecified atrial fibrillation: Secondary | ICD-10-CM | POA: Diagnosis not present

## 2022-03-14 DIAGNOSIS — N179 Acute kidney failure, unspecified: Secondary | ICD-10-CM | POA: Diagnosis not present

## 2022-03-14 DIAGNOSIS — I5043 Acute on chronic combined systolic (congestive) and diastolic (congestive) heart failure: Secondary | ICD-10-CM

## 2022-03-14 LAB — BASIC METABOLIC PANEL
Anion gap: 10 (ref 5–15)
BUN: 68 mg/dL — ABNORMAL HIGH (ref 8–23)
CO2: 19 mmol/L — ABNORMAL LOW (ref 22–32)
Calcium: 8.4 mg/dL — ABNORMAL LOW (ref 8.9–10.3)
Chloride: 109 mmol/L (ref 98–111)
Creatinine, Ser: 3 mg/dL — ABNORMAL HIGH (ref 0.61–1.24)
GFR, Estimated: 20 mL/min — ABNORMAL LOW (ref >60)
Glucose, Bld: 101 mg/dL — ABNORMAL HIGH (ref 70–99)
Potassium: 4.4 mmol/L (ref 3.5–5.1)
Sodium: 138 mmol/L (ref 135–145)

## 2022-03-14 LAB — CBC
HCT: 27.7 % — ABNORMAL LOW (ref 39.0–52.0)
Hemoglobin: 8.6 g/dL — ABNORMAL LOW (ref 13.0–17.0)
MCH: 26.2 pg (ref 26.0–34.0)
MCHC: 31 g/dL (ref 30.0–36.0)
MCV: 84.5 fL (ref 80.0–100.0)
Platelets: 75 10*3/uL — ABNORMAL LOW (ref 150–400)
RBC: 3.28 MIL/uL — ABNORMAL LOW (ref 4.22–5.81)
RDW: 21 % — ABNORMAL HIGH (ref 11.5–15.5)
WBC: 6.8 10*3/uL (ref 4.0–10.5)
nRBC: 0 % (ref 0.0–0.2)

## 2022-03-14 LAB — PROCALCITONIN: Procalcitonin: 1.38 ng/mL

## 2022-03-14 MED ORDER — AMIODARONE HCL IN DEXTROSE 360-4.14 MG/200ML-% IV SOLN
30.0000 mg/h | INTRAVENOUS | Status: DC
  Administered 2022-03-15: 30 mg/h via INTRAVENOUS
  Filled 2022-03-14 (×4): qty 200

## 2022-03-14 MED ORDER — AMIODARONE HCL 200 MG PO TABS: 400.0000 mg | ORAL_TABLET | Freq: Two times a day (BID) | ORAL | Status: DC

## 2022-03-14 MED ORDER — AMIODARONE HCL 200 MG PO TABS: 200.0000 mg | ORAL_TABLET | Freq: Every day | ORAL | Status: DC

## 2022-03-14 MED ORDER — METOPROLOL TARTRATE 12.5 MG HALF TABLET: 12.5000 mg | ORAL_TABLET | Freq: Two times a day (BID) | ORAL | Status: DC

## 2022-03-14 MED ORDER — FUROSEMIDE 10 MG/ML IJ SOLN
20.0000 mg | Freq: Two times a day (BID) | INTRAMUSCULAR | Status: AC
  Administered 2022-03-14 (×2): 20 mg via INTRAVENOUS
  Filled 2022-03-14 (×2): qty 2

## 2022-03-14 NOTE — Progress Notes (Signed)
[facility] PROGRESS NOTE  Jordan Oconnell (DOB: 31-Mar-1940) [id number] PCP: Burnard Bunting, MD  Brief Narrative: Jordan Oconnell is an 82 y.o. male with a history of PAF/flutter with recent DCCV 03/03/21, tachy-brady s/p PPM, HFrEF (likely tachy-induced) with LEV 25% on 1/24, AS s/p TAVR, HTN, CKD stage 4, RCC s/p left nephrectomy and urothelial carcinoma in R kidney who has had several weeks of worsening fatigue, exertional limitations who presented to cardiology clinic 2/15 and subsequently referred to the ED when device interrogation confirmed atrial flutter with RVR. In the ED, ECG and interrogation confirmed Atrial flutter with 2:1 conduction with RVR, Tn 152 > 133, BNP 3,602, hgb 10 (previously 9.5), SCr 3.08 (previously 2.83). CT chest/abd/pelvis showed small pleural effusions, stranding in right renal pelvis, large anterior abdominal hernia, aortic atherosclerosis, mediastinal lymphadenopathy. Cardiology was consulted, IV amiodarone initiated and the patient was admitted by the hospitalist team. DCCV performed 2/16. Respiratory status worsened, CXR revealing increased pulmonary edema for which lasix was given. IV amiodarone load continues.   Subjective: Breathing subjectively fine but winded with any exertion. Doesn't have chest pain. Developed tachycardia again last night, confirmed by ECG this AM to be AFib. He denies fever, denies sputum production, urinary complaints, or new wounds.  Objective: BP 117/89 (BP Location: Right Arm)   Pulse (!) 109   Temp 98.1 F (36.7 C) (Oral)   Resp (!) 26   Ht 6' 1"$  (1.854 m)   Wt 81.4 kg   SpO2 96%   BMI 23.68 kg/m   Gen: Pleasant elderly male in no distress Pulm: Crackles at bases, tachypneic with supplemental oxygen  CV: Irreg tachycardia, +stable murmur GI: Soft, NT, ND, +BS Neuro: Alert and oriented. No new focal deficits. Ext: Warm, no deformities. Skin: No rashes, lesions or ulcers on visualized skin   Assessment &  Plan: Atrial flutter/fibrillation with RVR, tachy-brady s/p dual chamber PPM: Recurrent. Had DCCV 2/7 with subsequent reversion to AFib.  - Continue IV amiodarone load per cardiology. ECG confirms back into AFib this AM. Suspect this is leading to CHF decompensation  - Continue uninterrupted eliquis - K, Mg replete, TSH as below.  Acute hypoxic respiratory failure: Likely from pulmonary edema, though aspiration pneumonitis is a concomitant possibility. RVP neg. WBC has normalized. He doesn't have much of a cough, no fever.  - CXR this AM consistent with diffuse opacity more consistent with pulmonary edema than infection. Defer diuretic plan to cardiology, may need nephrology involvement. Monitor off abx unless fevers or worsens. - Wean O2 as tolerated  Acute on chronic HFrEF, AS s/p TAVR: LVEF 25%, normal prosthetic function by recent echo. Appears euvolemic.  - Suspected to be tachycardia-mediated. No ACE/ARB/ARNI due to advanced CKD.  - Given lasix 88m IV x1 2/16. BNP down, respiratory status stabilized but now worse with return ERAF. Will   CAD with demand myocardial ischemia: No chest pain. Troponin mildly elevated, not consistent with ACS. No inpatient work up planned.   Hypothyroidism: TSH 4.609 (ULN for this assay is 4.5),  - Continue synthroid at home dose. Fatigue could be caused by suptherapeutic thyroid replacement, though TSH would likely be much higher for this to be considered a primary cause. Regardless, not planning on increasing synthroid dose in the setting of tachyarrhythmia. - Recheck TFTs outside scope of hospitalization. TSH 3.95 at [facility] earlier this month and normal on last 6 monthly checks.  Stage IV CKD, hx partial left nephrectomy, ureterectomy, cystectomy with ileostomy, NAGMA: Remains near baseline.  - Avoid  nephrotoxins, renally dose medications including eliquis.  - Continue sodium bicarbonate - Continue outpatient follow up with [place] nephrology, Dr. [name] - SCr stable 3.08 > 3.06 > 3.13 (GFR 20).   Urothelial carcinoma in R kidney: There has been proposal of resection of remaining kidney/ureter which would require initiation of HD. A decision has not yet been made.  - CT abd/pelvis included in admission work up as the patient was going to miss scheduled exam as outpatient due to admission. This has been completed. The patient requests that his PCP, Dr. Reynaldo Minium, and [facility] oncology be the ones to discuss next steps with him.   - On keytruda, an additional cause of fatigue suspected for this patient.  Iron deficiency anemia: Microcytic, stable. No gross bleeding reported. Suspect an element of AOCKD as well.  - Continue po iron. Iron low at 18, 6% sat, ferritin 29. B12 220 so will supp.   Thrombocytopenia: Chronic.  - Monitor  Patrecia Pour, MD [facility] [email or web address] 03/14/2022, 11:13 AM

## 2022-03-14 NOTE — Progress Notes (Signed)
Rounding Note    Patient Name: Jordan Oconnell Date of Encounter: 03/14/2022  [place] Cardiologist: Sanda Klein, MD   Subjective   Feeling ok.  Denies dyspnea but visibly short of breath.   Inpatient Medications    Scheduled Meds:  ALPRAZolam  0.25 mg Oral QHS   apixaban  2.5 mg Oral BID   vitamin B-12  1,000 mcg Oral Daily   ferrous sulfate  325 mg Oral Q breakfast   latanoprost  1 drop Both Eyes QHS   levothyroxine  50 mcg Oral QHS   sodium bicarbonate  1,300 mg Oral BID   Continuous Infusions:  amiodarone     PRN Meds: alum & mag hydroxide-simeth, levalbuterol, ondansetron **OR** ondansetron (ZOFRAN) IV   Vital Signs    Vitals:   03/14/22 0022 03/14/22 0415 03/14/22 0657 03/14/22 0848  BP: 108/87 112/84  117/89  Pulse: (!) 107 (!) 110  (!) 109  Resp: (!) 30 20 (!) 30 (!) 26  Temp: 98.6 F (37 C) 98.3 F (36.8 C)  98.1 F (36.7 C)  TempSrc: Oral Oral  Oral  SpO2: 96% 97%  96%  Weight:   81.4 kg   Height:        Intake/Output Summary (Last 24 hours) at 03/14/2022 1104 Last data filed at 03/14/2022 1100 Gross per 24 hour  Intake 670.04 ml  Output 1325 ml  Net -654.96 ml      03/14/2022    6:57 AM 03/12/2022    8:25 AM 03/11/2022    2:41 PM  Last 3 Weights  Weight (lbs) 179 lb 7.3 oz 182 lb 15.7 oz 183 lb  Weight (kg) 81.4 kg 83 kg 83.008 kg      Telemetry    Atrial fibrillation.  - Personally Reviewed  ECG    N/a - Personally Reviewed  Physical Exam   VS:  BP 117/89 (BP Location: Right Arm)   Pulse (!) 109   Temp 98.1 F (36.7 C) (Oral)   Resp (!) 26   Ht 6' 1"$  (1.854 m)   Wt 81.4 kg   SpO2 96%   BMI 23.68 kg/m  , BMI Body mass index is 23.68 kg/m. GENERAL:  Well appearing HEENT: Pupils equal round and reactive, fundi not visualized, oral mucosa unremarkable NECK:  JVP at clavicle at 45 degrees. , waveform within normal limits, carotid upstroke brisk and symmetric, no bruits, no thyromegaly LUNGS: Crackles at  base.  HEART:  Tachycardic.  Irregularly irregular.Marland Kitchen  PMI not displaced or sustained,S1 and S2 within normal limits, no S3, no S4, no clicks, no rubs, no murmurs ABD:  Flat, positive bowel sounds normal in frequency in pitch, no bruits, no rebound, no guarding, no midline pulsatile mass, no hepatomegaly, no splenomegaly EXT:  2 plus pulses throughout, no edema, no cyanosis no clubbing SKIN:  No rashes no nodules NEURO:  Cranial nerves II through XII grossly intact, motor grossly intact throughout PSYCH:  Cognitively intact, oriented to person place and time   Labs    High Sensitivity Troponin:   Recent Labs  Lab 03/11/22 1811 03/11/22 2023  TROPONINIHS 152* 133*     Chemistry Recent Labs  Lab 03/11/22 2023 03/12/22 0713 03/13/22 0121 03/14/22 0109  NA  --  139 138 138  K  --  4.7 4.2 4.4  CL  --  111 109 109  CO2  --  16* 20* 19*  GLUCOSE  --  107* 96 101*  BUN  --  69* 66* 68*  CREATININE  --  3.06* 3.13* 3.00*  CALCIUM  --  8.8* 8.6* 8.4*  MG 2.5*  --  2.1  --   PROT  --  6.0*  --   --   ALBUMIN  --  3.3*  --   --   AST  --  17  --   --   ALT  --  16  --   --   ALKPHOS  --  56  --   --   BILITOT  --  1.0  --   --   GFRNONAA  --  20* 19* 20*  ANIONGAP  --  12 9 10    $ Lipids No results for input(s): "CHOL", "TRIG", "HDL", "LABVLDL", "LDLCALC", "CHOLHDL" in the last 168 hours.  Hematology Recent Labs  Lab 03/12/22 0713 03/13/22 0121 03/14/22 0109  WBC 6.3 14.7* 6.8  RBC 3.77* 3.59*  3.55* 3.28*  HGB 9.6* 9.0* 8.6*  HCT 34.8* 31.0* 27.7*  MCV 92.3 86.4 84.5  MCH 25.5* 25.1* 26.2  MCHC 27.6* 29.0* 31.0  RDW 21.1* 21.2* 21.0*  PLT 98* 87* 75*   Thyroid  Recent Labs  Lab 03/12/22 0032  TSH 4.609*    BNP Recent Labs  Lab 03/11/22 1811 03/13/22 0820  BNP 3,602.4* 1,669.7*    DDimer No results for input(s): "DDIMER" in the last 168 hours.   Radiology    DG CHEST PORT 1 VIEW  Result Date: 03/12/2022 CLINICAL DATA:  Tachypnea EXAM: PORTABLE CHEST  1 VIEW COMPARISON:  03/11/2022 FINDINGS: Two frontal views of the chest demonstrate stable aortic valve prosthesis and dual lead pacer. Cardiac silhouette is enlarged. There is progressive central vascular congestion, with worsening bilateral perihilar airspace disease. Small bilateral pleural effusions. No pneumothorax. IMPRESSION: 1. Findings consistent with congestive heart failure and worsening volume status. Electronically Signed   By: Randa Ngo M.D.   On: 03/12/2022 18:21    Cardiac Studies   Relevant CV Studies: TTE 02/19/22  1. Systolic dysfunction is new compared with the echo 01/2021.   2. Left ventricular ejection fraction, by estimation, is 20 to 25%. The left ventricle has severely decreased function. The left ventricle demonstrates regional wall motion abnormalities (see scoring diagram/findings for description). There is mild  concentric left ventricular hypertrophy. Left ventricular diastolic function could not be evaluated.   3. Right ventricular systolic function is mildly reduced. The right ventricular size is normal. There is normal pulmonary artery systolic pressure.   4. Left atrial size was severely dilated.   5. The mitral valve is normal in structure. Mild mitral valve regurgitation. No evidence of mitral stenosis.   6. The aortic valve has been repaired/replaced. Aortic valve regurgitation is not visualized. No aortic stenosis is present. There is a 29 mm Sapien prosthetic (TAVR) valve present in the aortic position. Aortic valve mean gradient measures 5.8 mmHg. Aortic valve Vmax measures 1.58 m/s.   7. Aortic dilatation noted. There is moderate dilatation of the ascending aorta, measuring 43 mm.   8. The inferior vena cava is normal in size with greater than 50% respiratory variability, suggesting right atrial pressure of 3 mmHg.    TTE 02/20/21  1. Abnormal septal motion . Left ventricular ejection fraction, by estimation, is 55%. The left ventricle has normal function.  The left ventricle has no regional wall motion abnormalities. Left ventricular diastolic parameters were normal.   2. Pacing wires in RA/RV. Right ventricular systolic function is normal. The right ventricular size is  normal.   3. Left atrial size was moderately dilated.   4. The mitral valve is abnormal. Mild mitral valve regurgitation. No evidence of mitral stenosis.   5. Post TAVR with 29 mm Sapien 3 valve no significant PVL gradients have increased since previous echo . The aortic valve has been repaired/replaced. Aortic valve regurgitation is not visualized. No aortic stenosis is present.   6. Aortic dilatation noted. There is moderate dilatation of the ascending aorta, measuring 43 mm.   7. The inferior vena cava is normal in size with greater than 50% respiratory variability, suggesting right atrial pressure of 3 mmHg.   Patient Profile     82 y.o. male with chronic systolic and diastolic heart failure, aortic stenosis status post TAVR, sick sinus syndrome status post dual-chamber pacemaker, urothelial cancer, CKD 4, and paroxysmal atrial fibrillation/flutter admitted with heart failure and atrial flutter.  Assessment & Plan    # Acute on chronic systolic diastolic heart failure: LVEF 20-25%.  LVEF was 55% 01/2021.  He is thought to have nonischemic cardiomyopathy likely due to atrial fibrillation and LBBB related dyssynchrony.  No ischemic evaluation thus far.  BNP was 3602 on admission, 1669 yesterday.  He is now back in atrial fibrillation, dyspneic and volume overloaded.  Resume lasix at 59m IV bid.  If renal function worsens, will need RHC and nephrology involvement.   Would benefit for Advanced HF consult this week. Unable to titrate GDMT due to BP and renal function.    # PAF/flutter: Patient presented to EP clinic in 2:1 atrial flutter and decompensated heart failure.  He is now status post cardioversion 03/12/2022.  He converted to sinus rhythm but now back in afib.  Previously off  Eliquis in the setting of hematuria but has recently been on it.  He remains on IV amiodarone.  1.3g so far of a 5g load.  Plan to optimize HF and repeat DCCV after 5g load.  # CKD 4:  S/p R nephrectomy and partial L nephrectomy for urothelial cancer.  Renal function is worsened after diuresis but better today and he is dyspneic and volume overloaded.  Resume lasix at 279mIV bid.  Nephrology tomorrow  if renal function worsens.  # Iron deficiency anemia:  Continue iron supplementation.  No active bleeding.        For questions or updates, please contact [place] consult [email or web address] for contact info under        Signed, TiSkeet LatchMD  03/14/2022, 11:04 AM

## 2022-03-14 NOTE — Progress Notes (Signed)
Pharmacist Heart Failure Core Measure Documentation  Assessment: Jordan Oconnell has an EF documented as 20-25% on 02/19/22.  Rationale: Heart failure patients with left ventricular systolic dysfunction (LVSD) and an EF < 40% should be prescribed an angiotensin converting enzyme inhibitor (ACEI) or angiotensin receptor blocker (ARB) at discharge unless a contraindication is documented in the medical record.  This patient is not currently on an ACEI or ARB for HF.  This note is being placed in the record in order to provide documentation that a contraindication to the use of these agents is present for this encounter.  ACE Inhibitor or Angiotensin Receptor Blocker is contraindicated (specify all that apply)  []$   ACEI allergy AND ARB allergy []$   Angioedema []$   Moderate or severe aortic stenosis []$   Hyperkalemia [x]$   Hypotension []$   Renal artery stenosis [x]$   Worsening renal function, preexisting renal disease or dysfunction   Billey Gosling, PharmD PGY1 Pharmacy Resident 2/18/20243:04 PM

## 2022-03-14 NOTE — Progress Notes (Signed)
Pt has remained on Amio gtt at 16.30m/hr throughout shift, per Dr RJeanmarie Hubertnote stating he is to receive a "5g load."

## 2022-03-14 NOTE — Care Management (Signed)
  Transition of Care [facility]) Screening Note   Patient Details  Name: Jordan Oconnell Date of Birth: 08/13/1940   Transition of Care [facility]) CM/SW Contact:    Carles Collet, RN Phone Number: 03/14/2022, 1:37 PM    Transition of Care Department [facility]) has reviewed patient and no TOC needs have been identified at this time. We will continue to monitor patient advancement through interdisciplinary progression rounds.     Patient admitted from home, lives alone, for [facility]. Hx pacer, back in a fib today, dyspnic and volume overloaded per cards. Amiodarone gtt, may repeat DCCV.   Last noted [place] in [place] was 2018.   PCP Dr Deirdre Pippins Coverage [employer] Medicare PPO

## 2022-03-15 ENCOUNTER — Inpatient Hospital Stay: Payer: Self-pay

## 2022-03-15 DIAGNOSIS — N179 Acute kidney failure, unspecified: Secondary | ICD-10-CM | POA: Diagnosis not present

## 2022-03-15 DIAGNOSIS — I4819 Other persistent atrial fibrillation: Secondary | ICD-10-CM

## 2022-03-15 DIAGNOSIS — N184 Chronic kidney disease, stage 4 (severe): Secondary | ICD-10-CM | POA: Diagnosis not present

## 2022-03-15 DIAGNOSIS — I5043 Acute on chronic combined systolic (congestive) and diastolic (congestive) heart failure: Secondary | ICD-10-CM | POA: Diagnosis not present

## 2022-03-15 DIAGNOSIS — I5023 Acute on chronic systolic (congestive) heart failure: Secondary | ICD-10-CM | POA: Diagnosis not present

## 2022-03-15 DIAGNOSIS — I4891 Unspecified atrial fibrillation: Secondary | ICD-10-CM | POA: Diagnosis not present

## 2022-03-15 LAB — BASIC METABOLIC PANEL
Anion gap: 14 (ref 5–15)
BUN: 58 mg/dL — ABNORMAL HIGH (ref 8–23)
CO2: 21 mmol/L — ABNORMAL LOW (ref 22–32)
Calcium: 9 mg/dL (ref 8.9–10.3)
Chloride: 105 mmol/L (ref 98–111)
Creatinine, Ser: 2.71 mg/dL — ABNORMAL HIGH (ref 0.61–1.24)
GFR, Estimated: 23 mL/min — ABNORMAL LOW (ref >60)
Glucose, Bld: 113 mg/dL — ABNORMAL HIGH (ref 70–99)
Potassium: 4.1 mmol/L (ref 3.5–5.1)
Sodium: 140 mmol/L (ref 135–145)

## 2022-03-15 LAB — CBC
HCT: 31.5 % — ABNORMAL LOW (ref 39.0–52.0)
Hemoglobin: 9.6 g/dL — ABNORMAL LOW (ref 13.0–17.0)
MCH: 25.9 pg — ABNORMAL LOW (ref 26.0–34.0)
MCHC: 30.5 g/dL (ref 30.0–36.0)
MCV: 84.9 fL (ref 80.0–100.0)
Platelets: 86 10*3/uL — ABNORMAL LOW (ref 150–400)
RBC: 3.71 MIL/uL — ABNORMAL LOW (ref 4.22–5.81)
RDW: 20.9 % — ABNORMAL HIGH (ref 11.5–15.5)
WBC: 5.5 10*3/uL (ref 4.0–10.5)
nRBC: 0 % (ref 0.0–0.2)

## 2022-03-15 LAB — COOXEMETRY PANEL
Carboxyhemoglobin: 2 % — ABNORMAL HIGH (ref 0.5–1.5)
Methemoglobin: <0.7 % (ref 0.0–1.5)
O2 Saturation: 67.3 %
Total hemoglobin: 9.6 g/dL — ABNORMAL LOW (ref 12.0–16.0)

## 2022-03-15 LAB — PROCALCITONIN: Procalcitonin: 0.82 ng/mL

## 2022-03-15 MED ORDER — SODIUM CHLORIDE 0.9% FLUSH
10.0000 mL | Freq: Two times a day (BID) | INTRAVENOUS | Status: DC
  Administered 2022-03-15 – 2022-03-22 (×14): 10 mL

## 2022-03-15 MED ORDER — CHLORHEXIDINE GLUCONATE CLOTH 2 % EX PADS
6.0000 | MEDICATED_PAD | Freq: Every day | CUTANEOUS | Status: DC
  Administered 2022-03-16 – 2022-03-21 (×6): 6 via TOPICAL

## 2022-03-15 MED ORDER — SODIUM CHLORIDE 0.9 % IV SOLN
510.0000 mg | Freq: Once | INTRAVENOUS | Status: AC
  Administered 2022-03-15: 510 mg via INTRAVENOUS
  Filled 2022-03-15: qty 17

## 2022-03-15 MED ORDER — CHLORHEXIDINE GLUCONATE CLOTH 2 % EX PADS
6.0000 | MEDICATED_PAD | Freq: Every day | CUTANEOUS | Status: DC
  Administered 2022-03-15: 6 via TOPICAL

## 2022-03-15 MED ORDER — SODIUM CHLORIDE 0.9% FLUSH: 10.0000 mL | INTRAVENOUS | Status: DC | PRN

## 2022-03-15 NOTE — Progress Notes (Signed)
Speech Language Pathology Treatment: Dysphagia  Patient Details Name: Jordan Oconnell MRN: [id number] DOB: 16-Jul-1940 Today's Date: 03/15/2022 Time: [id number] SLP Time Calculation (min) (ACUTE ONLY): 11 min  Assessment / Plan / Recommendation Clinical Impression  Pt states he has not noticed any difficulty swallowing and he feels that his respiratory status has improved. Pt was observed with trials of thin liquids, purees, and a mixed consistency regular solid (peaches in syrup) with no overt s/s of dysphagia or aspiration. Respiratory status appeared improved which seemed to increase overall coordination of breathing and swallowing. Education provided for continued diet management. Recommend upgrading to regular diet with thin liquids and no further SLP f/u with which pt agreed.    HPI HPI: Jordan Oconnell is an 82 y.o. male with a history of PAF/flutter with recent DCCV 03/03/21, tachy-brady s/p PPM, HFrEF (likely tachy-induced) with LEV 25% on 1/24, AS s/p TAVR, HTN, CKD stage 4, RCC s/p left nephrectomy and urothelial carcinoma in R kidney who has had several weeks of worsening fatigue, exertional limitations who presented to cardiology clinic 2/15 and subsequently referred to the ED when device interrogation confirmed atrial flutter with RVR. In the ED, ECG and interrogation confirmed Atrial flutter.  CT chest/abd/pelvis showed small pleural effusions, stranding in right renal pelvis, large anterior abdominal hernia, aortic atherosclerosis, mediastinal lymphadenopathy.  DCCV performed 2/16.      SLP Plan  All goals met      Recommendations for follow up therapy are one component of a multi-disciplinary discharge planning process, led by the attending physician.  Recommendations may be updated based on patient status, additional functional criteria and insurance authorization.    Recommendations  Diet recommendations: Regular;Thin liquid Liquids provided via: Cup;Straw Medication  Administration: Whole meds with liquid Supervision: Patient able to self feed Compensations: Slow rate;Small sips/bites (do not eat/drink if SOB) Postural Changes and/or Swallow Maneuvers: Seated upright 90 degrees                Oral Care Recommendations: Oral care BID Follow Up Recommendations: No SLP follow up Assistance recommended at discharge: PRN SLP Visit Diagnosis: Dysphagia, unspecified (R13.10) Plan: All goals met           Fabio Asa., Student SLP  03/15/2022, 4:02 PM

## 2022-03-15 NOTE — Progress Notes (Signed)
[facility] PROGRESS NOTE  Jordan Oconnell (DOB: Mar 07, 1940) [id number] PCP: Burnard Bunting, MD  Brief Narrative: Jordan Oconnell is an 82 y.o. male with a history of PAF/flutter with recent DCCV 03/03/21, tachy-brady s/p PPM, HFrEF (likely tachy-induced) with LEV 25% on 1/24, AS s/p TAVR, HTN, CKD stage 4, RCC s/p left nephrectomy and urothelial carcinoma in R kidney who has had several weeks of worsening fatigue, exertional limitations who presented to cardiology clinic 2/15 and subsequently referred to the ED when device interrogation confirmed atrial flutter with RVR. In the ED, ECG and interrogation confirmed Atrial flutter with 2:1 conduction with RVR, Tn 152 > 133, BNP 3,602, hgb 10 (previously 9.5), SCr 3.08 (previously 2.83). CT chest/abd/pelvis showed small pleural effusions, stranding in right renal pelvis, large anterior abdominal hernia, aortic atherosclerosis, mediastinal lymphadenopathy. Cardiology was consulted, IV amiodarone initiated and the patient was admitted by the hospitalist team. DCCV performed 2/16. Respiratory status worsened, CXR revealing increased pulmonary edema for which lasix was given. Hypoxia continued worsening and rhythm reverted to AFib on 2/18. Lasix was continued with improvement in respiratory status. IV amiodarone load ongoing.   Subjective: Off oxygen, breathing back near normal per pt. No chest pain. Able to ambulate.   Objective: BP 125/88 (BP Location: Right Arm)   Pulse 81   Temp (!) 97.5 F (36.4 C) (Oral)   Resp 19   Ht 6' 1"$  (1.854 m)   Wt 80.6 kg   SpO2 97%   BMI 23.44 kg/m   Gen: No distress, well-appearing elderly male Pulm: Clear, nonlabored  CV: Regular, appears to have p waves on tele. Stable murmur, no JVD. GI: Soft, NT, ND, +BS  Neuro: Alert and oriented. No new focal deficits. Ext: Warm, no deformities. Skin: No rashes, lesions or ulcers on visualized skin   Assessment & Plan: Atrial flutter/fibrillation with RVR,  tachy-brady s/p dual chamber PPM: Recurrent. Had DCCV 2/7 with subsequent reversion to AFib.  - Continue IV amiodarone load per cardiology. Does not tolerate atrial fibrillation well.  - Continue uninterrupted eliquis - K, Mg are replete, TSH as below.  Acute hypoxic respiratory failure: Likely from pulmonary edema, though aspiration pneumonitis is a concomitant possibility. RVP neg. WBC has normalized durably without antimicrobial Tx. He doesn't have much of a cough, no fever.  - Diuretic plan per cardiology. HF team to evaluate.  - Weaned O2   Acute on chronic HFrEF, AS s/p TAVR: LVEF 25%, normal prosthetic function by recent echo. Appears euvolemic.  - Suspected to be tachycardia-mediated. No ACE/ARB/ARNI due to advanced CKD.  - Diuretic plan per HF team. Cr improved with diuresis this past 24 hours.  CAD with demand myocardial ischemia: No chest pain. Troponin mildly elevated, not consistent with ACS. No inpatient work up planned.   Hypothyroidism: TSH 4.609 (ULN for this assay is 4.5),  - Continue synthroid at home dose. Fatigue could be caused by suptherapeutic thyroid replacement, though TSH would likely be much higher for this to be considered a primary cause. Regardless, not planning on increasing synthroid dose in the setting of tachyarrhythmia. - Recheck TFTs outside scope of hospitalization. TSH 3.95 at [facility] earlier this month and normal on last 6 monthly checks.  Stage IV CKD, hx partial left nephrectomy, ureterectomy, cystectomy with ileostomy, NAGMA: Remains near baseline.  - Avoid nephrotoxins, renally dose medications including eliquis.  - Continue sodium bicarbonate, CO2 21.  - Continue outpatient follow up with [place] nephrology, Dr. [name] - SCr stable 3.08 >> 2.71 (GFR  low 20's).   Urothelial carcinoma in R kidney: There has been proposal of resection of remaining kidney/ureter which would require initiation of HD. A decision has not yet been made.  - CT abd/pelvis  included in admission work up as the patient was going to miss scheduled exam as outpatient due to admission. This has been completed. The patient requests that his PCP, Dr. Reynaldo Oconnell, and [facility] oncology be the ones to discuss next steps with him.   - On keytruda, an additional cause of fatigue suspected for this patient.  Iron deficiency anemia: Microcytic, stable. No gross bleeding reported. Suspect an element of AOCKD as well.  - Continue po iron. Iron low at 18, 6% sat, ferritin 29. B12 220 so will supp.   Thrombocytopenia: Chronic.  - Monitor  Patrecia Pour, MD [facility] [email or web address] 03/15/2022, 1:04 PM

## 2022-03-15 NOTE — Plan of Care (Signed)

## 2022-03-15 NOTE — Care Management Important Message (Signed)
Important Message  Patient Details  Name: Jordan Oconnell MRN: [id number] Date of Birth: 05-21-1940   Medicare Important Message Given:  Yes     Shelda Altes 03/15/2022, 8:41 AM

## 2022-03-15 NOTE — Progress Notes (Signed)
Physical Therapy Treatment Patient Details Name: Jordan Oconnell MRN: [id number] DOB: 1940-09-13 Today's Date: 03/15/2022   History of Present Illness 82 y.o. male admitted with heart failure and atrial flutter. PMH: Chronic systolic and diastolic heart failure, aortic stenosis status post TAVR, sick sinus syndrome status post dual-chamber pacemaker, urothelial cancer, CKD 4, and paroxysmal atrial fibrillation/flutter.    PT Comments    Pt received in supine, agreeable to therapy session and mildly impulsive. Pt refusing to trial RW and requesting EVA walker, pt agreeable to try stand-up rollator as EVA walkers are not typically available as DME to be used in the home setting. Pt with improved gait tolerance, needing cues for activity pacing and pt with episode of bowel incontinence upon return to room (he had ignored encouragement to return sooner to room). Pt needing up to minA for stand>sit to lower toilet height seat with single rail and mostly min guard to Supervision for gait with stand-up rollator. HR WFL throughout and SpO2 WFL on RA when sensor showing good pleth signal. ~4 standing rest breaks taken with PTA encouragement when pt appearing more fatigued. Pt continues to benefit from PT services to progress toward functional mobility goals.   Recommendations for follow up therapy are one component of a multi-disciplinary discharge planning process, led by the attending physician.  Recommendations may be updated based on patient status, additional functional criteria and insurance authorization.  Follow Up Recommendations  No PT follow up     Assistance Recommended at Discharge Intermittent Supervision/Assistance  Patient can return home with the following Assistance with cooking/housework;Help with stairs or ramp for entrance   Equipment Recommendations  None recommended by PT    Recommendations for Other Services       Precautions / Restrictions Precautions Precautions:  Fall Precaution Comments: bowel incontinence (helpful to bring briefs) Restrictions Weight Bearing Restrictions: No     Mobility  Bed Mobility Overal bed mobility: Modified Independent             General bed mobility comments: increased time/effort, pt using bed features to perform    Transfers Overall transfer level: Needs assistance Equipment used:  (stand-up rollator) Transfers: Sit to/from Stand Sit to Stand: Min guard, Min assist           General transfer comment: Pt. with decreased control with descent needs minA with fatigue; min guard to rise/steady for BUE placement on AD    Ambulation/Gait Ambulation/Gait assistance: Min guard Gait Distance (Feet): 800 Feet Assistive device: Standup Rollator Gait Pattern/deviations: Step-through pattern       General Gait Details: moving well with stand-up rollator (at pt request) no LOB, pt had refused regular RW despite max encouragement. min guard at times for safety due to pt increased cadence and environment obstacles; cues for pursed-lip breathing with fatigue, SpO2 WFL when he rests his fingers (noisy signal while pt holding on to RW).      Balance Overall balance assessment: Needs assistance Sitting-balance support: No upper extremity supported Sitting balance-Leahy Scale: Good     Standing balance support: Single extremity supported Standing balance-Leahy Scale: Fair Standing balance comment: standing for peri-care with L wall rail                            Cognition Arousal/Alertness: Awake/alert Behavior During Therapy: WFL for tasks assessed/performed, Impulsive Overall Cognitive Status: Within Functional Limits for tasks assessed  General Comments: unclear of pt baseline, however pt was moderately impulsive, tending to continue ambulation despite PTA encouraging him to return to the room sooner due to pt need for BM; pt ended up having BM on  floor as he was returning to room. Pt refusing to trial regular RW and insisting on EVA walker, so PTA brought stand-up rollator as EVA walkers are typically not used outside of hospital and pt reports wanting to wean off AD for home.        Exercises      General Comments General comments (skin integrity, edema, etc.): HR 81 bpm and SpO2 93-96% on RA when good pleth signal achieved seated/standing. Some redness to peri area, student RN present and aware and to notify RN.      Pertinent Vitals/Pain Pain Assessment Pain Assessment: Faces Faces Pain Scale: Hurts a little bit Pain Location: bottom with peri-care Pain Descriptors / Indicators: Grimacing, Discomfort Pain Intervention(s): Monitored during session Scientist, forensic applied barrier ointment after PTA assisted with peri-care)     PT Goals (current goals can now be found in the care plan section) Acute Rehab PT Goals Patient Stated Goal: to return to independent and walking for exercise PT Goal Formulation: With patient Time For Goal Achievement: 03/26/22 Progress towards PT goals: Progressing toward goals    Frequency    Min 3X/week      PT Plan Current plan remains appropriate       AM-PAC PT "6 Clicks" Mobility   Outcome Measure  Help needed turning from your back to your side while in a flat bed without using bedrails?: None Help needed moving from lying on your back to sitting on the side of a flat bed without using bedrails?: None Help needed moving to and from a bed to a chair (including a wheelchair)?: A Little Help needed standing up from a chair using your arms (e.g., wheelchair or bedside chair)?: A Little Help needed to walk in hospital room?: A Little Help needed climbing 3-5 steps with a railing? : Total 6 Click Score: 18    End of Session Equipment Utilized During Treatment: Gait belt Activity Tolerance: Patient tolerated treatment well Patient left: in bed;with call bell/phone within reach;with  nursing/sitter in room (student RN present to assist pt with hygiene, pt sitting EOB while she assists him with gown change/donning clean socks) Nurse Communication: Mobility status PT Visit Diagnosis: Other abnormalities of gait and mobility (R26.89)     Time: [id number] PT Time Calculation (min) (ACUTE ONLY): 30 min  Charges:  $Gait Training: 23-37 mins                     Jimmey Hengel P., PTA Acute Rehabilitation Services Secure Chat Preferred 9a-5:30pm Office: [phone]    Carlene Coria 03/15/2022, 5:16 PM

## 2022-03-15 NOTE — Consult Note (Addendum)
Advanced Heart Failure Team Consult Note   Primary Physician: Burnard Bunting, MD PCP-Cardiologist:  Sanda Klein, MD  Reason for Consultation: [name] MD  HPI:    Jordan Oconnell is seen today for evaluation of acute decompensated HF in the setting a fib with RVR at the request of general cardiology service.   He has a history of AS s/p TAVR (01/25/21), tachy-brady syndrome s/p dual chamber pacemaker, HFrEF EF 25% (likely tachy induced), paroxysmal atrial fibrillation/flutter, L renal clear cell s/p partial nephrectomy (06/2006), L renal pelvis and ureter cancer (high grade urothelial carcinoma) s/p L ureterectomy, cystectomy, and resection of residual L nephrectomy (urothelial carcinoma), bladder cancer, R ureteral cancer CKD stage 4, moderate aortic root aneurysm.   Patient initially presented to the EP clinic 2/15 noting that he had progressively worsening dyspnea, stating he had rest dyspnea at his recent clinic visit. He also stated that he felt extreme fatigue. He overall states that he felt poorly over the last 6-8 weeks. He denies chest pain, orthopnea, early satiety, or PND.   Of note, patient is s/p DCCV 2/7 with return to NSR. 2/12 he reported to cardiology clinic that his fatigue had worsened and noted his HR were labile (80-130bpm)   On arrival to the ED his blood pressures were stable [id number] sytolic. HR 100-120, O2 sat 98-100%. He was noted to have troponinemia up to 152, BNP 3602, CT C/a/P with bilateral plueral effusions, known large anterior wall hernia, aortic sclerosis, enlargement of R renal pelvis.  Bedside device interrogation in ED confirms persistent 2:1 atrial flutter.   Patient was started on amiodarone infusion and underwent repeat DCCV 2/16. He was seen by EP who noted that when patient is in rapidly conducted a flutter he has aberrantly conducted QRS w/ wide LBBB pattern leading to ventricular dyssynchrony and decompensated HF. There was attempted  overdrive pacing of his atrial flutter with brief success.   He remained symptomatic s/p DCCV. Patient noted to be in atrial fibrillation. He also had Cxr findings concerning for pulmonary edema. He was continued on IV amiodarone and he received IV lasix 11m x1, and IV lasix 284mBID the following day.    This AM patient states that his SOB has improved and he was able to ambulate around the hallway with minimal dyspnea. He also denies orthopnea or PND.   Echo 02/19/22 LV EF 20 to 25% (EF 55% on TTE 01/2021), mild LVH, RV sys fxn mildly reduced, normal PA sys pressure, LA severely dilated, mild MR, AV mean gradient presures 5.59m37m, vmax 1.559m49mAortic dilatation 43mm56mDevice interrogation 02/24/22 showed AFL burden of 72% and 2/15 showed AT/AF burden of 52%. The year prior he was noted to have ~30% AFL burden.    Review of Systems: [y] = yes, [ ]$  = no   General: Weight gain [ ]$ ; Weight loss [ ]$ ; Anorexia [ ]$ ; Fatigue [ ]$ ; Fever [ ]$ ; Chills [ ]$ ; Weakness [ ]$   Cardiac: Chest pain/pressure [ ]$ ; Resting SOB [ ]$ ; Exertional SOB [y ];Blue.Reeserthopnea [ ]$ ; Pedal Edema [ ]$ ; Palpitations [ ]$ ; Syncope [ ]$ ; Presyncope [ ]$ ; Paroxysmal nocturnal dyspnea[ ]$   Pulmonary: Cough [ ]$ ; Wheezing[ ]$ ; Hemoptysis[ ]$ ; Sputum [ ]$ ; Snoring [ ]$   GI: Vomiting[ ]$ ; Dysphagia[ ]$ ; Melena[ ]$ ; Hematochezia [ ]$ ; Heartburn[ ]$ ; Abdominal pain [ ]$ ; Constipation [ ]$ ; Diarrhea [ ]$ ; BRBPR [ ]$   GU: Hematuria[ ]$ ; Dysuria [ ]$ ; Nocturia[ ]$   Vascular: Pain in legs  with walking [ ]$ ; Pain in feet with lying flat [ ]$ ; Non-healing sores [ ]$ ; Stroke [ ]$ ; TIA [ ]$ ; Slurred speech [ ]$ ;  Neuro: Headaches[ ]$ ; Vertigo[ ]$ ; Seizures[ ]$ ; Paresthesias[ ]$ ;Blurred vision [ ]$ ; Diplopia [ ]$ ; Vision changes [ ]$   Ortho/Skin: Arthritis [ ]$ ; Joint pain [ ]$ ; Muscle pain [ ]$ ; Joint swelling [ ]$ ; Back Pain [ ]$ ; Rash [ ]$   Psych: Depression[ ]$ ; Anxiety[ ]$   Heme: Bleeding problems [ ]$ ; Clotting disorders [ ]$ ; Anemia [ ]$   Endocrine: Diabetes [ ]$ ; Thyroid dysfunction[  ]  Home Medications Prior to Admission medications   Medication Sig Start Date End Date Taking? Authorizing Provider  ALPRAZolam (XANAX) 0.25 MG tablet Take 0.25 mg by mouth at bedtime.   Yes [provider]  amiodarone (PACERONE) 400 MG tablet Take 1 tablet (400 mg total) by mouth daily. 03/01/22  Yes Croitoru, Mihai, MD  azithromycin (ZITHROMAX) 500 MG tablet Take 1 tablet by mouth 1 hour before dental procedures and cleanings 02/06/21  Yes Eileen Stanford, PA-C  ELIQUIS 2.5 MG TABS tablet Take 2.5 mg by mouth 2 (two) times daily. 01/28/22  Yes [provider]  ferrous sulfate 325 (65 FE) MG EC tablet Take 1 tablet (325 mg total) by mouth in the morning and at bedtime. 03/02/22  Yes Croitoru, Mihai, MD  latanoprost (XALATAN) 0.005 % ophthalmic solution Place 1 drop into both eyes at bedtime. 09/08/20  Yes [provider]  levothyroxine (SYNTHROID) 50 MCG tablet Take 50 mcg by mouth at bedtime.   Yes [provider]  metoprolol succinate (TOPROL-XL) 50 MG 24 hr tablet Take 1 tablet (50 mg total) by mouth in the morning and at bedtime. Take with or immediately following a meal. Patient taking differently: Take 25 mg by mouth in the morning and at bedtime. Take with or immediately following a meal. 03/01/22  Yes Croitoru, Mihai, MD  mirtazapine (REMERON) 15 MG tablet Take 7.5 mg by mouth at bedtime. 08/09/20  Yes [provider]  Pembrolizumab (KEYTRUDA IV) Inject into the vein every 6 (six) weeks. Infusion   Yes [provider]  sodium bicarbonate 650 MG tablet Take 1,300 mg by mouth 2 (two) times daily. 09/01/20  Yes [provider]  hydrALAZINE (APRESOLINE) 50 MG tablet Take 50 mg by mouth 2 (two) times daily. Patient not taking: Reported on 03/01/2022  03/01/22  [provider]    Past Medical History: Past Medical History:  Diagnosis Date   Bladder cancer ([facility])    BPH (benign prostatic hyperplasia)    CKD (chronic kidney  disease), stage IV ([facility])    Colon polyps    Diverticulosis    GERD (gastroesophageal reflux disease)    Hyperlipidemia    pt denies   Hypertension    PAF (paroxysmal atrial fibrillation) ([place])    on Flecanide and diltiazem. No OAC given recurrent hematuria   Renal cell carcinoma ([place]) 2008   left   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 Burt Knack and Dr. Cyndia Bent   Severe aortic stenosis 10/05/2020    Past Surgical History: Past Surgical History:  Procedure Laterality Date   APPENDECTOMY     BLADDER SURGERY  2008   transurethral resection/resection of prostatic urethra   BOWEL RESECTION     CARDIOVERSION N/A 03/03/2022   Procedure: CARDIOVERSION;  Surgeon: Buford Dresser, MD;  Location: [facility];  Service: Cardiovascular;  Laterality: N/A;   CARDIOVERSION N/A 03/12/2022   Procedure: CARDIOVERSION;  Surgeon: Berniece Salines, DO;  Location: [facility];  Service: Cardiovascular;  Laterality: N/A;   ESOPHAGOGASTRODUODENOSCOPY N/A 07/24/2013   Procedure: ESOPHAGOGASTRODUODENOSCOPY (EGD);  Surgeon: [name], MD;  Location: [name];  Service: Endoscopy;  Laterality: N/A;   INTRAOPERATIVE TRANSTHORACIC ECHOCARDIOGRAM N/A 01/27/2021   Procedure: INTRAOPERATIVE TRANSTHORACIC ECHOCARDIOGRAM;  Surgeon: Sherren Mocha, MD;  Location: [facility];  Service: Open Heart Surgery;  Laterality: N/A;   laparoscopic surgery  2012   laser   (?)   NEPHRECTOMY  2007   partial, left   NEPHROSTOMY  2011   stent   NM MYOCAR PERF WALL MOTION  09/08/2010   Normal   PACEMAKER IMPLANT N/A 02/12/2021   Procedure: PACEMAKER IMPLANT;  Surgeon: Vickie Epley, MD;  Location: [facility];  Service: Cardiovascular;  Laterality: N/A;   RIGHT HEART CATH AND CORONARY ANGIOGRAPHY N/A 10/06/2020   Procedure: RIGHT HEART CATH AND CORONARY ANGIOGRAPHY;  Surgeon: Sherren Mocha, MD;  Location: [facility];  Service: Cardiovascular;  Laterality: N/A;   ROBOT ASSISTED LAPAROSCOPIC COMPLETE CYSTECT ILEAL CONDUIT     TRANSCATHETER AORTIC VALVE REPLACEMENT, TRANSFEMORAL Right 01/27/2021   Procedure: TRANSCATHETER AORTIC VALVE REPLACEMENT, TRANSFEMORAL;  Surgeon: Sherren Mocha, MD;  Location: [facility];  Service: Open Heart Surgery;  Laterality: Right;   US ECHOCARDIOGRAPHY  0000000   mild diastolic dysfunction,mild dilated LA,mild MR,AI,mildly dilated aortic root    Family History: Family History  Problem Relation Age of Onset   Ovarian cancer Mother    Colon cancer Mother    Colon cancer Father    Crohn's disease Son    Breast cancer Daughter    Colon cancer Brother     Social History: Social History   Socioeconomic History   Marital status: Divorced    Spouse name: Not on file   Number of children: 2   Years of education: Not on file   Highest education level: Not on file  Occupational History   Occupation: Pharmacist, hospital  Tobacco Use   Smoking status: Never   Smokeless tobacco: Never  Vaping Use   Vaping Use: Never used  Substance and Sexual Activity   Alcohol use: Yes    Comment: occasionally   Drug use: No   Sexual activity: Not on file  Other Topics Concern   Not on file  Social History Narrative   Not on file   Social Determinants of Health   Financial Resource Strain: Not on file  Food Insecurity: No Food Insecurity (03/13/2022)   Hunger Vital Sign    Worried About Running Out of Food in the Last Year: Never true    Ran Out of Food in the Last Year: Never true  Transportation Needs: No Transportation Needs (03/13/2022)   PRAPARE - Hydrologist (Medical): No    Lack of Transportation (Non-Medical): No  Physical Activity: Not on file  Stress: Not on file  Social Connections: Not on file    Allergies:  Allergies  Allergen Reactions   Amoxicillin Other (See Comments)    Acute interstitial nephritis   Doxycycline Hives    Flomax [Tamsulosin Hcl] Other (See Comments)    Dizzy     Objective:    Vital Signs:   Temp:  [97.6 F (36.4 C)-98.5 F (36.9 C)] 98.3 F (36.8 C) (02/19 0757) Pulse Rate:  [75-86] 82 (02/19 0757) Resp:  [18-23] 18 (02/19  0757) BP: (102-134)/(58-91) 134/91 (02/19 0757) SpO2:  [95 %-98 %] 95 % (02/19 0757) Weight:  [80.6 kg] 80.6 kg (02/19 0417) Last BM Date : 03/15/22 (pt had second BM, loose and watery)  Weight change: Filed Weights   03/12/22 0825 03/14/22 0657 03/15/22 0417  Weight: 83 kg 81.4 kg 80.6 kg    Intake/Output:   Intake/Output Summary (Last 24 hours) at 03/15/2022 1132 Last data filed at 03/15/2022 0339 Gross per 24 hour  Intake 340.95 ml  Output 950 ml  Net -609.05 ml      Physical Exam    General:  Chronically ill appearing. No resp difficulty HEENT: normal Neck: supple. No JVD . No lymphadenopathy appreciated. Cor: Regular rate & rhythm. No rubs, gallops or murmurs. No edema of BLE  Lungs: clear Abdomen: soft, nontender, nondistended. No hepatosplenomegaly. No bruits or masses. Good bowel sounds.L abdominal wall with hernia, contents soft, RLQ w/ urostomy, draining clear urine  Extremities: no cyanosis, clubbing, rash, edema Neuro: alert & orientedx3, cranial nerves grossly intact. moves all 4 extremities w/o difficulty. Affect pleasant   Telemetry   NSR, 17s of atrial fibrillation, 5 beats of VT  EKG    A fib with RVR  Labs   Basic Metabolic Panel: Recent Labs  Lab 03/11/22 1811 03/11/22 2023 03/12/22 0713 03/13/22 0121 03/14/22 0109 03/15/22 0732  NA 138  --  139 138 138 140  K 4.8  --  4.7 4.2 4.4 4.1  CL 108  --  111 109 109 105  CO2 17*  --  16* 20* 19* 21*  GLUCOSE 93  --  107* 96 101* 113*  BUN 68*  --  69* 66* 68* 58*  CREATININE 3.08*  --  3.06* 3.13* 3.00* 2.71*  CALCIUM 9.3  --  8.8* 8.6* 8.4* 9.0  MG  --  2.5*  --  2.1  --   --     Liver Function Tests: Recent Labs  Lab 03/12/22 0713  AST 17  ALT 16   ALKPHOS 56  BILITOT 1.0  PROT 6.0*  ALBUMIN 3.3*    CBC: Recent Labs  Lab 03/11/22 1811 03/12/22 0713 03/13/22 0121 03/14/22 0109 03/15/22 0732  WBC 7.0 6.3 14.7* 6.8 5.5  NEUTROABS 5.3  --   --   --   --   HGB 10.0* 9.6* 9.0* 8.6* 9.6*  HCT 34.4* 34.8* 31.0* 27.7* 31.5*  MCV 88.0 92.3 86.4 84.5 84.9  PLT 109* 98* 87* 75* 86*    BNP: BNP (last 3 results) Recent Labs    03/11/22 1811 03/13/22 0820  BNP 3,602.4* 1,669.7*     Imaging   [place] EKG SITE RITE  Result Date: 03/15/2022 If Site Rite image not attached, placement could not be confirmed due to current cardiac rhythm.    Medications:     Current Medications:  ALPRAZolam  0.25 mg Oral QHS   apixaban  2.5 mg Oral BID   vitamin B-12  1,000 mcg Oral Daily   ferrous sulfate  325 mg Oral Q breakfast   latanoprost  1 drop Both Eyes QHS   levothyroxine  50 mcg Oral QHS   sodium bicarbonate  1,300 mg Oral BID    Infusions:  amiodarone 30 mg/hr (03/15/22 1114)      Patient Profile   82 y.o. male with chronic systolic and diastolic heart failure 2/2 tachy-induced cardiomyopathy, aortic stenosis status post TAVR, tachybrady syndrome status post dual-chamber pacemaker, urothelial cancer, CKD 4, and paroxysmal atrial fibrillation/flutter admitted  with heart failure exacerbation d/t atrial flutter.   Assessment/Plan   Acute on chronic systolic diastolic heart failure:  LVEF 20-25% (EF 55% 01/2021). Likely 2/2 NICM in the setting of afib and LBBB related dyssynchrony. He underwent LHC 9/22 which showed stenosis of D1.   Patient appears euvolemic on exam and notes his dyspnea is improved s/p IV lasix 53m BID. He made 1.35L of urine (net negative 1L over the last 24h) and he is down 2lbs this AM.   His acute decompensation is likely secondary to his tachyarrhythmia as well as his underlying interventricular conduction delay leading to ventricular dyssynchrony.  -Will hold on further diuresis until CVP and Coox  obtained from PICC line -EP to see patient regarding reprogramming of his device.  -GDMT is limited by his renal dysfunction  -Will add hydralazine for afterload reduction, given mildly elevated systolic blood pressures   2. Atrial flutter/ atrial fibrillation:   Patient is s/p DCCV 2/7 and 2/16. He is currently on amiodarone. He is currently in NSR.  -Will obtain EKG -Continue amiodarone -Continue eliquis   3. CKD 4: Scr is improved with diuresis. Appears to be near baseline.  -Will continue to monitor  4: IDA:  Will give IV iron.  5. AS S/p TAVR  Normal valve function by recent TTE.   Medication concerns reviewed with patient and pharmacy team. Barriers identified: None  Length of Stay: 4[place] MD  IM resident PGY-3 03/15/2022, 11:32 AM  Advanced Heart Failure Team Pager [phone](M-F; 7a - 5p)  Please contact [facility] for night-coverage after hours (4p -7a ) and weekends on [email or web address]   Patient seen with resident, agree with the above note.   Patient has history of TAVR in 1/23 with post-procedural heart block and [employer] PPM with left bundle lead placed.  Echo in 1/24 showed EF 55%, normal bioprosthetic aortic valve.  Over the last year, atrial fibrillation burden has increased, now about 30% AF by device interrogation.  Echo in 1/24 showed EF down to 20-25%, mildly decreased RV systolic function, and normal bioprosthetic aortic valve.  Patient was started on amiodarone on 2/5 and had DCCV 2/7, but atrial fibrillation with RV recurred.  He was admitted with AF/RVR on 2/15 and underwent repeat DCCV on 2/16 to NSR.  On 2/18, he was back in AF, but today he is in NSR.  He was short of breath over the weekend and diuresed.  Baseline creatinine around 2.5, creatinine rose to 3.1.  Lasix held and he is back down to 2.7.    General: NAD Neck: No JVD, no thyromegaly or thyroid nodule.  Lungs: Clear to auscultation bilaterally with normal respiratory  effort. CV: Nondisplaced PMI.  Heart regular S1/S2, no S3/S4, 2/6 early SEM RUSB.  No peripheral edema.  No carotid bruit.  Normal pedal pulses.  Abdomen: Soft, nontender, no hepatosplenomegaly, no distention.  Skin: Intact without lesions or rashes.  Neurologic: Alert and oriented x 3.  Psych: Normal affect. Extremities: No clubbing or cyanosis.  HEENT: Normal.   1. Atrial fibrillation/flutter: Persistent, admitted with RVR.  30% AF/AFL by device interrogation over the last few months.  Now on amiodarone gtt and maintaining NSR after DCCV 2/6 and again 2/16.  Possible that fall in EF is due in part to tachy-mediated CMP.  - Continue amiodarone gtt today, probably to po tomorrow if he remains in NSR.  - Continue apixaban, dosed for renal function/age at 2.5 bid.  2. AKI on CKD  stage IV: Baseline creatinine 2.5, up to 3.1 this admission.  Lower today at 2.7.  Possible cardiorenal component.  He is not markedly volume overloaded on exam.   - Hold diuretics today.  - Getting PICC, will follow CVP.  3. Acute on chronic systolic CHF: Probably nonischemic cardiomyopathy.  Cath prior to TAVR in 9/22 showed 90% stenosis in D1 and 60% ramus stenosis, not extensive CAD. Echo in 1/23 post-TAVR with EF 55%.  Echo in 1/24 with EF down to 20-25%, mildly decreased RV systolic function, and normal bioprosthetic aortic valve.  Atrial fibrillation burden is now greater, may be due to tachycardia-mediated CMP (was in RVR at admission).  However, he also has developed a wide IVCD (up to 170 msec on ECGs this admission).  ?Dyssynchrony-mediated CMP.  He is only set for back-up pacing.  He was diuresed some this admission then diuretics stopped with rising creatinine and concern for low output.  On my exam, volume status looks ok and I do not think he is low output.  However, PICC is being placed.  - Follow CVP and co-ox off PICC.  Hold diuretics for now, will diurese if CVP elevated.  - I will ask EP to see, he has a  left bundle lead and may benefit from setting him to left bundle pace regularly rather than just back-up pacing given wide IVCD.  Will repeat ECG to assess QRS in NSR.  - Need to maintain NSR as above.  - GDMT will be limited by elevated creatinine. If co-ox not low, will add Coreg.  4. Bradycardia: S/p [employer] PPM with LB lead placed post-TAVR.  5. Severe aortic stenosis: S/p TAVR in 1/23.  Bioprosthetic aortic valve stable on 1/24 echo.   6. Fe deficiency anemia: Will give Feraheme.   Loralie Champagne 03/15/2022 3:18 PM

## 2022-03-15 NOTE — Progress Notes (Signed)
Peripherally Inserted Central Catheter Placement  The IV Nurse has discussed with the patient and/or persons authorized to consent for the patient, the purpose of this procedure and the potential benefits and risks involved with this procedure.  The benefits include less needle sticks, lab draws from the catheter, and the patient may be discharged home with the catheter. Risks include, but not limited to, infection, bleeding, blood clot (thrombus formation), and puncture of an artery; nerve damage and irregular heartbeat and possibility to perform a PICC exchange if needed/ordered by physician.  Alternatives to this procedure were also discussed.  Bard Power PICC patient education guide, fact sheet on infection prevention and patient information card has been provided to patient /or left at bedside.    PICC Placement Documentation  PICC Double Lumen 03/15/22 Right Brachial 39 cm 0 cm (Active)  Indication for Insertion or Continuance of Line Vasoactive infusions 03/15/22 1446  Exposed Catheter (cm) 0 cm 03/15/22 1446  Site Assessment Clean, Dry, Intact;Other (Comment) 03/15/22 1446  Lumen #1 Status Flushed;Saline locked;Blood return noted 03/15/22 1446  Lumen #2 Status Flushed;Saline locked;Blood return noted 03/15/22 1446  Dressing Type Transparent;Securing device 03/15/22 1446  Dressing Status Antimicrobial disc in place;Clean, Dry, Intact 03/15/22 1446  Safety Lock Not Applicable [id number]  Line Adjustment (NICU/IV Team Only) No 03/15/22 1446  Dressing Intervention New dressing;Other (Comment) 03/15/22 1446  Dressing Change Due 03/22/22 03/15/22 1446    Already have a bruised distal, below the PICC site prior to insertion.   Enos Fling 03/15/2022, 2:49 PM

## 2022-03-16 DIAGNOSIS — I5023 Acute on chronic systolic (congestive) heart failure: Secondary | ICD-10-CM | POA: Diagnosis not present

## 2022-03-16 DIAGNOSIS — I5043 Acute on chronic combined systolic (congestive) and diastolic (congestive) heart failure: Secondary | ICD-10-CM | POA: Diagnosis not present

## 2022-03-16 DIAGNOSIS — I4819 Other persistent atrial fibrillation: Secondary | ICD-10-CM | POA: Diagnosis not present

## 2022-03-16 DIAGNOSIS — I4891 Unspecified atrial fibrillation: Secondary | ICD-10-CM | POA: Diagnosis not present

## 2022-03-16 DIAGNOSIS — N184 Chronic kidney disease, stage 4 (severe): Secondary | ICD-10-CM | POA: Diagnosis not present

## 2022-03-16 DIAGNOSIS — N179 Acute kidney failure, unspecified: Secondary | ICD-10-CM | POA: Diagnosis not present

## 2022-03-16 LAB — COOXEMETRY PANEL
Carboxyhemoglobin: 2.5 % — ABNORMAL HIGH (ref 0.5–1.5)
Methemoglobin: <0.7 % (ref 0.0–1.5)
O2 Saturation: 58.2 %
Total hemoglobin: 9.1 g/dL — ABNORMAL LOW (ref 12.0–16.0)

## 2022-03-16 LAB — BASIC METABOLIC PANEL
Anion gap: 9 (ref 5–15)
BUN: 51 mg/dL — ABNORMAL HIGH (ref 8–23)
CO2: 22 mmol/L (ref 22–32)
Calcium: 8.4 mg/dL — ABNORMAL LOW (ref 8.9–10.3)
Chloride: 108 mmol/L (ref 98–111)
Creatinine, Ser: 2.43 mg/dL — ABNORMAL HIGH (ref 0.61–1.24)
GFR, Estimated: 26 mL/min — ABNORMAL LOW (ref >60)
Glucose, Bld: 108 mg/dL — ABNORMAL HIGH (ref 70–99)
Potassium: 4.2 mmol/L (ref 3.5–5.1)
Sodium: 139 mmol/L (ref 135–145)

## 2022-03-16 LAB — MAGNESIUM: Magnesium: 2 mg/dL (ref 1.7–2.4)

## 2022-03-16 MED ORDER — AMIODARONE HCL IN DEXTROSE 360-4.14 MG/200ML-% IV SOLN
60.0000 mg/h | INTRAVENOUS | Status: DC
  Administered 2022-03-16 (×2): 60 mg/h via INTRAVENOUS
  Filled 2022-03-16 (×3): qty 200

## 2022-03-16 MED ORDER — MAGNESIUM SULFATE IN D5W 1-5 GM/100ML-% IV SOLN
1.0000 g | Freq: Once | INTRAVENOUS | Status: AC
  Administered 2022-03-16: 1 g via INTRAVENOUS
  Filled 2022-03-16: qty 100

## 2022-03-16 MED ORDER — AMIODARONE LOAD VIA INFUSION
150.0000 mg | Freq: Once | INTRAVENOUS | Status: AC
  Administered 2022-03-16: 150 mg via INTRAVENOUS
  Filled 2022-03-16: qty 83.34

## 2022-03-16 MED ORDER — CARVEDILOL 3.125 MG PO TABS
3.1250 mg | ORAL_TABLET | Freq: Two times a day (BID) | ORAL | Status: DC
  Administered 2022-03-16 – 2022-03-19 (×6): 3.125 mg via ORAL
  Filled 2022-03-16 (×6): qty 1

## 2022-03-16 MED ORDER — AMIODARONE HCL 200 MG PO TABS
400.0000 mg | ORAL_TABLET | Freq: Two times a day (BID) | ORAL | Status: DC
  Administered 2022-03-16: 400 mg via ORAL
  Filled 2022-03-16: qty 2

## 2022-03-16 MED ORDER — AMIODARONE HCL IN DEXTROSE 360-4.14 MG/200ML-% IV SOLN
30.0000 mg/h | INTRAVENOUS | Status: DC
  Administered 2022-03-17 – 2022-03-18 (×4): 30 mg/h via INTRAVENOUS
  Filled 2022-03-16 (×3): qty 200

## 2022-03-16 NOTE — Progress Notes (Addendum)
Advanced Heart Failure Rounding Note  PCP-Cardiologist: Sanda Klein, MD   Subjective:    Coox 58.2 CVP 3-80mHg  1.5L uop (~net even) Able to ambulate, with minimal dyspnea.    Objective:   Weight Range: 84.7 kg Body mass index is 24.64 kg/m.   Vital Signs:   Temp:  [97.5 F (36.4 C)-98.6 F (37 C)] 97.8 F (36.6 C) (02/20 0755) Pulse Rate:  [70-83] 83 (02/20 0755) Resp:  [14-24] 24 (02/20 0755) BP: (123-136)/(75-88) 126/85 (02/20 0755) SpO2:  [93 %-98 %] 94 % (02/20 0755) Weight:  [84.7 kg] 84.7 kg (02/20 0354) Last BM Date : 03/16/22  Weight change: Filed Weights   03/14/22 0657 03/15/22 0417 03/16/22 0354  Weight: 81.4 kg 80.6 kg 84.7 kg    Intake/Output:   Intake/Output Summary (Last 24 hours) at 03/16/2022 1000 Last data filed at 03/16/2022 0758 Gross per 24 hour  Intake 1253.05 ml  Output 2025 ml  Net -771.95 ml      Physical Exam    General:  Well appearing. No resp difficulty HEENT: Normal Neck: Supple. JVP 8 cmH2O . Carotids 2+ bilat; no bruits. No lymphadenopathy or thyromegaly appreciated. Cor: Regular rate & rhythm. No rubs, gallops or murmurs. Lungs: Clear to auscultation  Abdomen: Soft, nontender, nondistended. No hepatosplenomegaly. No bruits or masses. Good bowel sounds. Large ventral hernia L abdominal wall. Herniated contents soft, no erythema of overlying skin. R abd with urostomy, clear urine in the bag  Extremities: No cyanosis, clubbing, rash, edema Neuro: Alert & orientedx3, cranial nerves grossly intact. moves all 4 extremities w/o difficulty. Affect pleasant   Telemetry   3-4 ep of NSVT, NSR   EKG    V paced   Labs    CBC Recent Labs    03/14/22 0109 03/15/22 0732  WBC 6.8 5.5  HGB 8.6* 9.6*  HCT 27.7* 31.5*  MCV 84.5 84.9  PLT 75* 86*   Basic Metabolic Panel Recent Labs    03/15/22 0732 03/16/22 0600  NA 140 139  K 4.1 4.2  CL 105 108  CO2 21* 22  GLUCOSE 113* 108*  BUN 58* 51*  CREATININE 2.71*  2.43*  CALCIUM 9.0 8.4*  MG  --  2.0    BNP: BNP (last 3 results) Recent Labs    03/11/22 1811 03/13/22 0820  BNP 3,602.4* 1,669.7*     Medications:     Scheduled Medications:  ALPRAZolam  0.25 mg Oral QHS   apixaban  2.5 mg Oral BID   Chlorhexidine Gluconate Cloth  6 each Topical Daily   vitamin B-12  1,000 mcg Oral Daily   ferrous sulfate  325 mg Oral Q breakfast   latanoprost  1 drop Both Eyes QHS   levothyroxine  50 mcg Oral QHS   sodium bicarbonate  1,300 mg Oral BID   sodium chloride flush  10-40 mL Intracatheter Q12H   Infusions:  amiodarone 30 mg/hr (03/16/22 0700)    PRN Medications: alum & mag hydroxide-simeth, levalbuterol, ondansetron **OR** ondansetron (ZOFRAN) IV, sodium chloride flush    Patient Profile   Jordan Oconnell an 82yo M with 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   Assessment/Plan   #Atrial fibrillation/flutter: Patient is currently in NSR. He is on amiodarone infusion and apixiban 2.569mBID.  -Transition to PO amiodarone  -Continue apixiban   #  Acute on chronic systolic CHF: Likely NICM in the setting of increase AF/AFL burden. Previous LHC 09/2020 showed 90% stenosis in D1 and 50% ramus stenosis. EF at that time was 55%. His EF was 20-25% on TTE 01/2022. He has not had an ischemic evaluation since the decrease in his EF. Patient is euvolemic on exam this AM, CVP 3-27mHg. Coox 58.2. -Will continue to hold diuresis  -F/u EP recommendations regarding pacing.  -GDMT limited by renal function, will add low dose coreg this AM   #CKD 4 Patient's scr is improved this AM to 2.43 which appears to be his baseline.   #IDA Patient is receiving IV iron. Hgb is stable.   Medication concerns reviewed with patient and pharmacy team.  Barriers identified: None   Length of Stay: 5[place] MD  03/16/2022, 10:00 AM  Advanced Heart Failure Team Pager [phone](M-F; 7a - 5p)  Please contact [facility] for night-coverage after hours (5p -7a ) and weekends on [email or web address]   Patient seen with resident, agree with the above note.   Co-ox 58% today with CVP 3-4.  Walked in hall yesterday. Creatinine lower at 2.43.  BP stable.   Patient is in NSR and is LB pacing.   General: NAD Neck: No JVD, no thyromegaly or thyroid nodule.  Lungs: Clear to auscultation bilaterally with normal respiratory effort. CV: Nondisplaced PMI.  Heart regular S1/S2, no S3/S4, no murmur.  No peripheral edema.   Abdomen: Soft, nontender, no hepatosplenomegaly, no distention.  Skin: Intact without lesions or rashes.  Neurologic: Alert and oriented x 3.  Psych: Normal affect. Extremities: No clubbing or cyanosis.  HEENT: Normal.   1. Atrial fibrillation/flutter: Persistent, admitted with RVR.  30% AF/AFL by device interrogation over the last few months.  Now on amiodarone gtt and maintaining NSR after DCCV 2/6 and again 2/16.  Possible that fall in EF is due to tachy-mediated CMP with loss of left bundle pacing.  - Transition to po amiodarone 400 mg bid.  - Continue apixaban, dosed for renal function/age at 2.5 bid.  - ECG today.  2. AKI on CKD stage IV: Baseline creatinine 2.5, up to 3.1 this admission.  Lower today at 2.7 => 2.43.  Possible cardiorenal component.  He is not volume overloaded, CVP 3-4.  - Hold diuretics today.  3. Acute on chronic systolic CHF: Probably nonischemic cardiomyopathy.  Cath prior to TAVR in 9/22 showed 90% stenosis in D1 and 60% ramus stenosis, not extensive CAD. Echo in 1/23 post-TAVR with EF 55%.  Echo in 1/24 with EF down to 20-25%, mildly decreased RV systolic function, and normal bioprosthetic aortic valve.  Atrial fibrillation burden is now greater, fall in EF may be due to tachycardia-mediated CMP (was  in RVR at admission) combined with loss of left bundle pacing when in RVR.  He was diuresed some this admission then diuretics stopped with rising creatinine and concern for low output.  CVP 3-4 today with co-ox 58%.  Creatinine down to 2.4.  - CVP low, no diuretics.  - He is left bundle pacing when not in RVR, so need to keep him in NSR.  - GDMT will be limited by elevated creatinine. Can add Coreg 3.125 mg bid.   4. Bradycardia: S/p [employer] PPM with LB lead placed post-TAVR.  5. Severe aortic stenosis: S/p TAVR in 1/23.  Bioprosthetic aortic valve stable on 1/24 echo.   6. Fe deficiency anemia: Has had Feraheme.   Mobilize.   Jordan Oconnell [employer]  03/16/2022 10:32 AM

## 2022-03-16 NOTE — Progress Notes (Signed)
[facility] PROGRESS NOTE  Jordan Oconnell (DOB: 05-24-1940) [id number] PCP: Burnard Bunting, MD  Brief Narrative: Jordan Oconnell is an 82 y.o. male with a history of PAF/flutter with recent DCCV 03/03/21, tachy-brady s/p PPM, HFrEF (likely tachy-induced) with LEV 25% on 1/24, AS s/p TAVR, HTN, CKD stage 4, RCC s/p left nephrectomy and urothelial carcinoma in R kidney who has had several weeks of worsening fatigue, exertional limitations who presented to cardiology clinic 2/15 and subsequently referred to the ED when device interrogation confirmed atrial flutter with RVR. In the ED, ECG and interrogation confirmed Atrial flutter with 2:1 conduction with RVR, Tn 152 > 133, BNP 3,602, hgb 10 (previously 9.5), SCr 3.08 (previously 2.83). CT chest/abd/pelvis showed small pleural effusions, stranding in right renal pelvis, large anterior abdominal hernia, aortic atherosclerosis, mediastinal lymphadenopathy. Cardiology was consulted, IV amiodarone initiated and the patient was admitted by the hospitalist team. DCCV performed 2/16. Respiratory status worsened, CXR revealing increased pulmonary edema for which lasix was given. Hypoxia continued worsening and rhythm reverted to AFib on 2/18. Lasix was continued with improvement in respiratory status. IV amiodarone load ongoing. Cardiology/HF team/EP on board.  Subjective: At bedside commode this morning. Had some dyspnea just getting there and RN reports dropping sat with this. He reports feeling better, no chest pain, normal UOP, no palpitations.  Objective: BP 126/85 (BP Location: Left Arm)   Pulse 83   Temp 97.8 F (36.6 C) (Oral)   Resp (!) 24   Ht 6' 1"$  (1.854 m)   Wt 84.7 kg   SpO2 94%   BMI 24.64 kg/m   Gen: Pleasant elderly male in no distress Pulm: Clear, diminished, tachypneic but nonlabored  CV: Regular this AM with stable systolic murmur, trace LE edema when legs dependent. GI: Soft, NT, ND, +BS  Neuro: Alert and oriented.  No new focal deficits. Ext: Warm, no deformities. LUA PICC site c/d/i Skin: No rashes, lesions or ulcers on visualized skin   Assessment & Plan: Atrial flutter/fibrillation with RVR, tachy-brady s/p dual chamber PPM: Recurrent. Had DCCV 2/7 with subsequent reversion to AFib.  - Continue IV amiodarone per cardiology. Does not tolerate atrial fibrillation well. EP team to reevaluate per HF team. - Continue uninterrupted eliquis - K, Mg are replete, TSH as below.  Acute hypoxic respiratory failure: Likely from pulmonary edema, though aspiration pneumonitis is a concomitant possibility. RVP neg. WBC has normalized durably without antimicrobial Tx. He doesn't have much of a cough, no fever.  - Diuretic plan per cardiology/HF team.  - Weaned O2 though still appears to be hypoxemic with exertion this AM.  Acute on chronic HFrEF, AS s/p TAVR: LVEF 25%, normal prosthetic function by recent echo. Appears euvolemic.  - Suspected to be tachycardia-mediated. No ACE/ARB/ARNI due to advanced CKD.  - Diuretic plan per HF team. Coox this AM 58.   CAD with demand myocardial ischemia: No chest pain. Troponin mildly elevated, not consistent with ACS. No inpatient work up planned.   Hypothyroidism: TSH 4.609 (ULN for this assay is 4.5),  - Continue synthroid at home dose. Fatigue could be caused by suptherapeutic thyroid replacement, though TSH would likely be much higher for this to be considered a primary cause. Regardless, not planning on increasing synthroid dose in the setting of tachyarrhythmia. - Recheck TFTs outside scope of hospitalization. TSH 3.95 at [facility] earlier this month and normal on last 6 monthly checks.  Stage IV CKD, hx partial left nephrectomy, ureterectomy, cystectomy with ileostomy, NAGMA: Remains near baseline.  -  Avoid nephrotoxins, renally dose medications including eliquis.  - Continue sodium bicarbonate, CO2 now normalized 2/20. - Continue outpatient follow up with [facility],  Dr. [name] - SCr improving 3.08 >> 2.43. PICC was placed 2/19 per HF team.  Urothelial carcinoma in R kidney: There has been proposal of resection of remaining kidney/ureter which would require initiation of HD. A decision has not yet been made.  - CT abd/pelvis included in admission work up as the patient was going to miss scheduled exam as outpatient due to admission. This has been completed. The patient requests that his PCP, Dr. Reynaldo Oconnell, and [facility] oncology be the ones to discuss next steps with him.   - On keytruda, an additional cause of fatigue suspected for this patient.  Iron deficiency anemia: Microcytic, stable. No gross bleeding reported. Suspect an element of AOCKD as well.  - Continue po iron. Iron low at 18, 6% sat, ferritin 29. IV iron ordered. B12 220 so will supp.   Thrombocytopenia: Chronic.  - Monitor  Patrecia Pour, MD [facility] [email or web address] 03/16/2022, 8:06 AM

## 2022-03-16 NOTE — TOC Initial Note (Signed)
Transition of Care [facility]) - Initial/Assessment Note    Patient Details  Name: Jordan Oconnell MRN: [id number] Date of Birth: Jun 30, 1940  [facility] of [facility] CM/SW Contact:    Erenest Rasher, RN Phone Number: [phone] 03/16/2022, 5:00 PM  Clinical Narrative:       Will need HHRN, PT and OT. Attending updated. Will need HHRN, PT and OT orders with F2F.              Spoke to pt at bedside. Pt states he would like a scale but he will not purchase one. Will provide pt with a scale for daily weights. Pt wants a stand up Rollator and explained that insurance does not cover. Provided pt with a picture of desired Rollator and contact number for [facility]. Explained he can rent or buy DME. Will continue to follow for dc needs.   Expected Discharge Plan: [place] Barriers to Discharge: Continued Medical Work up   Patient Goals and CMS Choice Patient states their goals for this hospitalization and ongoing recovery are:: wants to remain in his home and independent CMS Medicare.gov Compare Post Acute Care list provided to:: Patient        Expected Discharge Plan and Services   Discharge Planning Services: CM Consult Post Acute Care Choice: [place] arrangements for the past 2 months: Single Family Home                                      Prior Living Arrangements/Services Living arrangements for the past 2 months: Single Family Home Lives with:: Self Patient language and need for interpreter reviewed:: Yes Do you feel safe going back to the place where you live?: Yes      Need for Family Participation in Patient Care: No (Comment) Care giver support system in place?: Yes (comment) Current home services: DME (cane) Criminal Activity/Legal Involvement Pertinent to Current Situation/Hospitalization: No - Comment as needed  Activities of Daily Living Home Assistive Devices/Equipment: Walker (specify type) ADL Screening  (condition at time of admission) Patient's cognitive ability adequate to safely complete daily activities?: Yes Is the patient deaf or have difficulty hearing?: Yes Does the patient have difficulty seeing, even when wearing glasses/contacts?: No Does the patient have difficulty concentrating, remembering, or making decisions?: Yes Patient able to express need for assistance with ADLs?: Yes Does the patient have difficulty dressing or bathing?: No Independently performs ADLs?: Yes (appropriate for developmental age) Does the patient have difficulty walking or climbing stairs?: Yes Weakness of Legs: Both Weakness of Arms/Hands: Both  Permission Sought/Granted Permission sought to share information with : Case Manager, Family Supports, PCP Permission granted to share information with : Yes, Verbal Permission Granted  Share Information with NAME: Merlene Pulling     Permission granted to share info w Relationship: friend  Permission granted to share info w Contact Information: [phone]  Emotional Assessment Appearance:: Appears stated age Attitude/Demeanor/Rapport: Engaged Affect (typically observed): Accepting Orientation: : Oriented to Self, Oriented to Place, Oriented to  Time, Oriented to Situation   Psych Involvement: No (comment)  Admission diagnosis:  Atrial fibrillation with rapid ventricular response (HCC) [I48.91] Patient Active Problem List   Diagnosis Date Noted   Acute renal failure superimposed on stage 4 chronic kidney disease ([place]) 03/13/2022   Acute on chronic combined systolic and diastolic CHF (congestive heart failure) ([place]) 03/13/2022  Tachycardia-bradycardia syndrome ([place]) 03/11/2022   Atrial fibrillation with rapid ventricular response ([place]) 03/11/2022   Atypical atrial flutter ([place]) 03/03/2022   Pacemaker 02/12/2021   LBBB (left bundle branch block) 01/29/2021   1st degree AV block 01/29/2021   S/P TAVR (transcatheter aortic valve replacement)  01/27/2021   Severe aortic stenosis 10/05/2020   CKD (chronic kidney disease) stage 4, GFR 15-29 ml/min (HCC) 02/01/2017   Pneumonia 07/26/2013    Class: Acute   Intractable hiccups 07/22/2013    Class: Chronic   Anemia 07/22/2013    Class: Acute   Protein-calorie malnutrition, severe ([place]) 07/19/2013   Syncope and collapse 07/17/2013   Bladder cancer ([place]) 07/17/2013   Urothelial cancer ([place]) 09/03/2012   Essential hypertension 09/03/2012   PAF (paroxysmal atrial fibrillation) ([place]) 09/03/2012   PCP:  Burnard Bunting, MD Pharmacy:   [place], [place] - [address] [place] [place] [address] Phone: [phone] Fax: [place], [place] - [address] [place] [address] Phone: [phone] Fax: [phone]     Social Determinants of Health (SDOH) Social History: SDOH Screenings   Food Insecurity: No Food Insecurity (03/13/2022)  Housing: Low Risk  (03/13/2022)  Transportation Needs: No Transportation Needs (03/13/2022)  Utilities: Not At Risk (03/13/2022)  Tobacco Use: Low Risk  (03/14/2022)   SDOH Interventions:     Readmission Risk Interventions     No data to display

## 2022-03-16 NOTE — Progress Notes (Signed)
   Went back into AFIB RVR 120 after coughing. Will stop PO amio 400 BID  Start back IV amio with 136m bolus.   MCandee Furbish MD

## 2022-03-16 NOTE — Plan of Care (Signed)
  Problem: Education: Goal: Knowledge of disease or condition will improve Outcome: Progressing   Problem: Activity: Goal: Ability to tolerate increased activity will improve Outcome: Progressing   Problem: Education: Goal: Knowledge of General Education information will improve Description: Including pain rating scale, medication(s)/side effects and non-pharmacologic comfort measures Outcome: Progressing   Problem: Activity: Goal: Risk for activity intolerance will decrease Outcome: Progressing   Problem: Nutrition: Goal: Adequate nutrition will be maintained Outcome: Progressing   Problem: Pain Managment: Goal: General experience of comfort will improve Outcome: Progressing

## 2022-03-16 NOTE — Progress Notes (Signed)
   03/16/22 1640  Assess: MEWS Score  Temp 97.6 F (36.4 C)  BP 129/84  MAP (mmHg) 98  Pulse Rate (!) 122  ECG Heart Rate (!) 116  Resp 18  Level of Consciousness Alert  SpO2 93 %  O2 Device Room Air  Assess: MEWS Score  MEWS Temp 0  MEWS Systolic 0  MEWS Pulse 2  MEWS RR 0  MEWS LOC 0  MEWS Score 2  MEWS Score Color Yellow  Assess: if the MEWS score is Yellow or Red  Were vital signs taken at a resting state? Yes  Focused Assessment No change from prior assessment  Does the patient meet 2 or more of the SIRS criteria? Yes  Does the patient have a confirmed or suspected source of infection? Yes  Provider and Rapid Response Notified? No  MEWS guidelines implemented  Yes, yellow  Treat  MEWS Interventions Considered administering scheduled or prn medications/treatments as ordered  Take Vital Signs  Increase Vital Sign Frequency  Yellow: Q2hr x1, continue Q4hrs until patient remains green for 12hrs  Escalate  MEWS: Escalate Yellow: Discuss with charge nurse and consider notifying provider and/or RRT  Notify: Charge Nurse/RN  Name of Charge Nurse/RN Notified Kristin  Assess: SIRS CRITERIA  SIRS Temperature  0  SIRS Pulse 1  SIRS Respirations  0  SIRS WBC 0  SIRS Score Sum  1

## 2022-03-16 NOTE — Progress Notes (Signed)
OT Cancellation Note  Patient Details Name: Jordan Oconnell MRN: [id number] DOB: [birth year]   Cancelled Treatment:    Reason Eval/Treat Not Completed: Medical issues which prohibited therapy.  Patient with 12 lead ECG being performed due to A fib with RVR.  Will check back as time allows.  Patient checked on second time and eating.  Continue efforts as appropriate.    Kaelee Pfeffer D Klyde Banka 03/16/2022, 5:03 PM 03/16/2022  RP, OTR/L  Acute Rehabilitation Services  Office:  [phone]

## 2022-03-17 DIAGNOSIS — N179 Acute kidney failure, unspecified: Secondary | ICD-10-CM | POA: Diagnosis not present

## 2022-03-17 DIAGNOSIS — C671 Malignant neoplasm of dome of bladder: Secondary | ICD-10-CM | POA: Diagnosis not present

## 2022-03-17 DIAGNOSIS — I4891 Unspecified atrial fibrillation: Secondary | ICD-10-CM | POA: Diagnosis not present

## 2022-03-17 DIAGNOSIS — I4819 Other persistent atrial fibrillation: Secondary | ICD-10-CM | POA: Diagnosis not present

## 2022-03-17 DIAGNOSIS — I5023 Acute on chronic systolic (congestive) heart failure: Secondary | ICD-10-CM | POA: Diagnosis not present

## 2022-03-17 DIAGNOSIS — N184 Chronic kidney disease, stage 4 (severe): Secondary | ICD-10-CM | POA: Diagnosis not present

## 2022-03-17 LAB — COOXEMETRY PANEL
Carboxyhemoglobin: 1.9 % — ABNORMAL HIGH (ref 0.5–1.5)
Methemoglobin: <0.7 % (ref 0.0–1.5)
O2 Saturation: 54.3 %
Total hemoglobin: 9 g/dL — ABNORMAL LOW (ref 12.0–16.0)

## 2022-03-17 LAB — BASIC METABOLIC PANEL
Anion gap: 9 (ref 5–15)
BUN: 45 mg/dL — ABNORMAL HIGH (ref 8–23)
CO2: 21 mmol/L — ABNORMAL LOW (ref 22–32)
Calcium: 8.7 mg/dL — ABNORMAL LOW (ref 8.9–10.3)
Chloride: 110 mmol/L (ref 98–111)
Creatinine, Ser: 2.49 mg/dL — ABNORMAL HIGH (ref 0.61–1.24)
GFR, Estimated: 25 mL/min — ABNORMAL LOW (ref >60)
Glucose, Bld: 102 mg/dL — ABNORMAL HIGH (ref 70–99)
Potassium: 4 mmol/L (ref 3.5–5.1)
Sodium: 140 mmol/L (ref 135–145)

## 2022-03-17 NOTE — Progress Notes (Signed)
Occupational Therapy Treatment Patient Details Name: Jordan Oconnell MRN: [id number] DOB: 1940/07/07 Today's Date: 03/17/2022   History of present illness 82 y.o. male admitted with heart failure and atrial flutter. PMH: Chronic systolic and diastolic heart failure, aortic stenosis status post TAVR, sick sinus syndrome status post dual-chamber pacemaker, urothelial cancer, CKD 4, and paroxysmal atrial fibrillation/flutter.   OT comments  Pt progressing towards goals this session, received after PT session, educated pt on pursed lip breathing, SpO2 in high 80's with poor wave pleth. Pt able to complete LB dressing with min A, min guard for toilet transfer and min A for simulated tub transfer. Pt min guard A for bed mobility. Pt presenting with impairments listed below, will follow acutely. Updating d/c recommendation to [place].   Recommendations for follow up therapy are one component of a multi-disciplinary discharge planning process, led by the attending physician.  Recommendations may be updated based on patient status, additional functional criteria and insurance authorization.    Follow Up Recommendations  Home health OT     Assistance Recommended at Discharge Intermittent Supervision/Assistance  Patient can return home with the following  A little help with bathing/dressing/bathroom;A little help with walking and/or transfers;Assistance with cooking/housework   Equipment Recommendations  Tub/shower bench    Recommendations for Other Services PT consult    Precautions / Restrictions Precautions Precautions: Fall Precaution Comments: R foot 3/4th toe edema/redness, bowel incontinence (helpful to bring briefs) Restrictions Weight Bearing Restrictions: No       Mobility Bed Mobility Overal bed mobility: Needs Assistance Bed Mobility: Sit to Supine       Sit to supine: Min guard        Transfers Overall transfer level: Needs assistance Equipment used: Rollator (4  wheels) Transfers: Sit to/from Stand Sit to Stand: Min guard, Min assist                 Balance Overall balance assessment: Needs assistance Sitting-balance support: No upper extremity supported Sitting balance-Leahy Scale: Good     Standing balance support: Bilateral upper extremity supported Standing balance-Leahy Scale: Fair                             ADL either performed or assessed with clinical judgement   ADL Overall ADL's : Needs assistance/impaired                     Lower Body Dressing: Minimal assistance;Sit to/from stand Lower Body Dressing Details (indicate cue type and reason): doffing brief Toilet Transfer: Min guard;Ambulation;Regular Toilet;Rollator (4 wheels) Toilet Transfer Details (indicate cue type and reason): simulated via functional mobility     Tub/ Shower Transfer: Tub transfer;Minimal assistance;Ambulation;Rollator (4 wheels) Tub/Shower Transfer Details (indicate cue type and reason): simulated Functional mobility during ADLs: Min guard;Rollator (4 wheels)      Extremity/Trunk Assessment Upper Extremity Assessment Upper Extremity Assessment: Overall WFL for tasks assessed   Lower Extremity Assessment Lower Extremity Assessment: Defer to PT evaluation        Vision   Vision Assessment?: No apparent visual deficits   Perception Perception Perception: Not tested   Praxis Praxis Praxis: Not tested    Cognition Arousal/Alertness: Awake/alert Behavior During Therapy: Impulsive, Anxious Overall Cognitive Status: Within Functional Limits for tasks assessed  Exercises      Shoulder Instructions       General Comments VSS on RA    Pertinent Vitals/ Pain       Pain Assessment Pain Assessment: No/denies pain  Home Living                                          Prior Functioning/Environment              Frequency  Min  2X/week        Progress Toward Goals  OT Goals(current goals can now be found in the care plan section)  Progress towards OT goals: Progressing toward goals  Acute Rehab OT Goals Patient Stated Goal: to go home OT Goal Formulation: With patient Time For Goal Achievement: 03/27/22 Potential to Achieve Goals: Good ADL Goals Pt Will Perform Lower Body Bathing: with modified independence;sit to/from stand Pt Will Perform Lower Body Dressing: with modified independence;sit to/from stand Pt Will Transfer to Toilet: with modified independence;ambulating Pt Will Perform Toileting - Clothing Manipulation and hygiene: with modified independence;sit to/from stand Pt Will Perform Tub/Shower Transfer: Shower transfer;with modified independence;ambulating;shower seat  Plan Frequency remains appropriate;Discharge plan needs to be updated    Co-evaluation                 AM-PAC OT "6 Clicks" Daily Activity     Outcome Measure   Help from another person eating meals?: None Help from another person taking care of personal grooming?: A Little Help from another person toileting, which includes using toliet, bedpan, or urinal?: A Little Help from another person bathing (including washing, rinsing, drying)?: A Little Help from another person to put on and taking off regular upper body clothing?: A Little Help from another person to put on and taking off regular lower body clothing?: A Little 6 Click Score: 19    End of Session Equipment Utilized During Treatment: Rollator (4 wheels)  OT Visit Diagnosis: Unsteadiness on feet (R26.81);Other abnormalities of gait and mobility (R26.89)   Activity Tolerance Patient tolerated treatment well   Patient Left in bed;with call bell/phone within reach;with bed alarm set   Nurse Communication Mobility status;Other (comment) (IV is beeping)        Time: [id number] OT Time Calculation (min): 27 min  Charges: OT General Charges $OT Visit: 1  Visit OT Treatments $Self Care/Home Management : 8-22 mins $Therapeutic Activity: 8-22 mins  Renaye Rakers, OTD, OTR/L SecureChat Preferred Acute Rehab [phone]   Jordan Oconnell 03/17/2022, 3:59 PM

## 2022-03-17 NOTE — Assessment & Plan Note (Addendum)
Echocardiogram with reduced LV systolic function 20 to 123456, mild LVH, mid and distal inferior wall akinetic. Entire septum, basal inferior segment and apex are hypokinetic. RV with normal systolic function, LA with severe dilatation. SP TAVR.   Urine output is 0000000 ml Systolic blood pressure 123XX123 mmHg.   Plan to continue with carvedilol Diuretic therapy on hold.

## 2022-03-17 NOTE — Assessment & Plan Note (Addendum)
Sp urostomy.  Follow up as outpatient

## 2022-03-17 NOTE — Progress Notes (Addendum)
Advanced Heart Failure Rounding Note  PCP-Cardiologist: Sanda Klein, MD   Subjective:   Yesterday amio switched to po but later when back in A fib. IV amio restarted.   CVP low. Diuretics have been on hold.    CO-OX 54%   Objective:   Weight Range: 82.6 kg Body mass index is 24.03 kg/m.   Vital Signs:   Temp:  [97.6 F (36.4 C)-98.1 F (36.7 C)] 97.8 F (36.6 C) (02/21 0857) Pulse Rate:  [70-122] 72 (02/21 0857) Resp:  [16-20] 20 (02/21 0023) BP: (102-129)/(68-84) 127/79 (02/21 0857) SpO2:  [90 %-97 %] 96 % (02/21 0857) Weight:  [82.6 kg] 82.6 kg (02/21 0500) Last BM Date : 03/16/22  Weight change: Filed Weights   03/15/22 0417 03/16/22 0354 03/17/22 0500  Weight: 80.6 kg 84.7 kg 82.6 kg    Intake/Output:   Intake/Output Summary (Last 24 hours) at 03/17/2022 1018 Last data filed at 03/17/2022 0300 Gross per 24 hour  Intake 719.13 ml  Output 850 ml  Net -130.87 ml      Physical Exam  General:   No resp difficulty HEENT: normal Neck: supple. no JVD. Carotids 2+ bilat; no bruits. No lymphadenopathy or thryomegaly appreciated. Cor: PMI nondisplaced. Regular rate & rhythm. No rubs, gallops or murmurs. Lungs: clear Abdomen: + hernia soft, nontender, nondistended. No hepatosplenomegaly. No bruits or masses. Good bowel sounds. Urostomy.  Extremities: no cyanosis, clubbing, rash, edema Neuro: alert & orientedx3, cranial nerves grossly intact. moves all 4 extremities w/o difficulty. Affect pleasant  Telemetry   V paced.   EKG    V paced   Labs    CBC Recent Labs    03/15/22 0732  WBC 5.5  HGB 9.6*  HCT 31.5*  MCV 84.9  PLT 86*   Basic Metabolic Panel Recent Labs    03/16/22 0600 03/17/22 0517  NA 139 140  K 4.2 4.0  CL 108 110  CO2 22 21*  GLUCOSE 108* 102*  BUN 51* 45*  CREATININE 2.43* 2.49*  CALCIUM 8.4* 8.7*  MG 2.0  --     BNP: BNP (last 3 results) Recent Labs    03/11/22 1811 03/13/22 0820  BNP 3,602.4* 1,669.7*      Medications:     Scheduled Medications:  ALPRAZolam  0.25 mg Oral QHS   apixaban  2.5 mg Oral BID   carvedilol  3.125 mg Oral BID WC   Chlorhexidine Gluconate Cloth  6 each Topical Daily   vitamin B-12  1,000 mcg Oral Daily   ferrous sulfate  325 mg Oral Q breakfast   latanoprost  1 drop Both Eyes QHS   levothyroxine  50 mcg Oral QHS   sodium bicarbonate  1,300 mg Oral BID   sodium chloride flush  10-40 mL Intracatheter Q12H   Infusions:  amiodarone 30 mg/hr (03/17/22 0507)    PRN Medications: alum & mag hydroxide-simeth, levalbuterol, ondansetron **OR** ondansetron (ZOFRAN) IV, sodium chloride flush    Patient Profile   Jordan Oconnell is an 82 yo M with 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   Assessment/Plan  1. Atrial fibrillation/flutter: Persistent, admitted with RVR.  30% AF/AFL by device interrogation over the last few months.  Now on amiodarone gtt and maintaining NSR after DCCV 2/6 and again 2/16.  Possible that fall in EF is due to  tachy-mediated CMP with loss of left bundle pacing.  -Briefly on po amio but went back in A fib. IV amio restarted. Continue amio 30 mg  per hour today.  - Continue apixaban, dosed for renal function/age at 2.5 bid.  2. AKI on CKD stage IV: Baseline creatinine 2.5, up to 3.1 this admission.  Lower today at 2.7 => 2.43=>2.49 . Holding diuretics.  .  Possible cardiorenal component.   3. Acute on chronic systolic CHF: Probably nonischemic cardiomyopathy.  Cath prior to TAVR in 9/22 showed 90% stenosis in D1 and 60% ramus stenosis, not extensive CAD. Echo in 1/23 post-TAVR with EF 55%.  Echo in 1/24 with EF down to 20-25%, mildly decreased RV systolic function, and normal bioprosthetic aortic valve.  Atrial fibrillation burden  is now greater, fall in EF may be due to tachycardia-mediated CMP (was in RVR at admission) combined with loss of left bundle pacing when in RVR.  He was diuresed some this admission then diuretics stopped with rising creatinine and concern for low output.  CO-OX 54% - CVP remains low. Hold off on diuretics.  - He is left bundle pacing when not in RVR, so need to keep him in NSR.  - GDMT will be limited by elevated creatinine.  - Continue Coreg 3.125 mg bid.   4. Bradycardia: S/p [employer] PPM with LB lead placed post-TAVR.  5. Severe aortic stenosis: S/p TAVR in 1/23.  Bioprosthetic aortic valve stable on 1/24 echo.   6. Fe deficiency anemia: Has had Feraheme.     Length of Stay: 6  Amy Clegg, NP  03/17/2022, 10:18 AM  Advanced Heart Failure Team Pager [phone] (M-F; 7a - 5p)  Please contact [facility] for night-coverage after hours (5p -7a ) and weekends on [email or web address]  Patient seen with NP, agree with the above note.   He went back into atrial fibrillation yesterday, amiodarone IV was resumed.  Today, he is in NSR with left bundle pacing.  No complaints, walked with PT.  CVP remains low.  Creatinine stable at 2.49.    General: NAD Neck: No JVD, no thyromegaly or thyroid nodule.  Lungs: Clear to auscultation bilaterally with normal respiratory effort. CV: Nondisplaced PMI.  Heart regular S1/S2, no S3/S4, 2/6 early SEM RUSB.  No peripheral edema.   Abdomen: Soft, nontender, no hepatosplenomegaly, no distention.  Skin: Intact without lesions or rashes.  Neurologic: Alert and oriented x 3.  Psych: Normal affect. Extremities: No clubbing or cyanosis.  HEENT: Normal.   1. Atrial fibrillation/flutter: Persistent, admitted with RVR.  30% AF/AFL by device interrogation over the last few months.  Now on amiodarone gtt and maintaining NSR after DCCV 2/6 and again 2/16.  Possible that fall in EF is due to tachy-mediated CMP with loss of left bundle pacing. He was back in atrial  fibrillation yesterday, now in NSR after restarting IV amiodarone.  - Continue IV amiodarone today, if he stays in NSR, will try transition to po amiodarone again tomorrow.  - Continue apixaban, dosed for renal function/age at 2.5 bid.  - ECG today.  2. AKI on CKD stage IV: Baseline creatinine 2.5, up to 3.1 this admission.  Stable at 2.49 today.  Possible cardiorenal component.  He is not volume overloaded, CVP low.  - Hold diuretics today.  3. Acute on chronic systolic CHF: Probably nonischemic cardiomyopathy.  Cath prior to TAVR in 9/22 showed 90% stenosis in D1 and 60% ramus stenosis, not extensive CAD. Echo in 1/23 post-TAVR with EF  55%.  Echo in 1/24 with EF down to 20-25%, mildly decreased RV systolic function, and normal bioprosthetic aortic valve.  Atrial fibrillation burden is now greater, fall in EF may be due to tachycardia-mediated CMP (was in RVR at admission) combined with loss of left bundle pacing when in RVR.  He was diuresed some this admission then diuretics stopped with rising creatinine and concern for low output.  CVP low today with co-ox 54%.  Creatinine stable 2.49.  - CVP low, no diuretics.  - He is left bundle pacing when not in RVR, so need to keep him in NSR.  - Continue Coreg 3.125 mg bid.  - GDMT will be limited by elevated creatinine.   4. Bradycardia: S/p [employer] PPM with LB lead placed post-TAVR.  5. Severe aortic stenosis: S/p TAVR in 1/23.  Bioprosthetic aortic valve stable on 1/24 echo.   6. Fe deficiency anemia: Has had Feraheme.    Loralie Champagne 03/17/2022 3:46 PM

## 2022-03-17 NOTE — Progress Notes (Signed)
Physical Therapy Treatment Patient Details Name: Jordan Oconnell MRN: [id number] DOB: 07/10/1940 Today's Date: 03/17/2022   History of Present Illness 82 y.o. male admitted with heart failure and atrial flutter. PMH: Chronic systolic and diastolic heart failure, aortic stenosis status post TAVR, sick sinus syndrome status post dual-chamber pacemaker, urothelial cancer, CKD 4, and paroxysmal atrial fibrillation/flutter.    PT Comments    Pt received in supine, agreeable to therapy session with emphasis on transfer and gait training with rollator. Pt frustrated as he reports he prefers stand-up rollator, but this is not covered by his insurance according to pt (he spoke with case mgmt). Pt needing mostly min guard but intermittent minA for safety with bari rollator during gait trial and min guard for transfer safety. Pt with apparent trunk and cervical weakness and would benefit from HHPT upon DC to continue building strength/endurance and for home safety eval to reduce fall risk as he lives home alone, discussed with supervising PT Cathy H and updated below. Pt with red/swollen digits on R foot, RN notified, pt states he has not notified care team of this yet. Pt continues to benefit from PT services to progress toward functional mobility goals.    Recommendations for follow up therapy are one component of a multi-disciplinary discharge planning process, led by the attending physician.  Recommendations may be updated based on patient status, additional functional criteria and insurance authorization.  Follow Up Recommendations  Home health PT     Assistance Recommended at Discharge Intermittent Supervision/Assistance  Patient can return home with the following Assistance with cooking/housework;Help with stairs or ramp for entrance   Equipment Recommendations  Rolling walker (2 wheels);Other (comment) (pt requests a stand-up rollator, not sure if covered by insurance and pt likely to be safer  with 2WW.)    Recommendations for Other Services       Precautions / Restrictions Precautions Precautions: Fall Precaution Comments: R foot 3/4th toe edema/redness, bowel incontinence (helpful to bring briefs) Restrictions Weight Bearing Restrictions: No     Mobility  Bed Mobility Overal bed mobility: Needs Assistance Bed Mobility: Supine to Sit     Supine to sit: Supervision     General bed mobility comments: cues for line awareness, pt using bed features/rails to pull up on.    Transfers Overall transfer level: Needs assistance Equipment used: Rollator (4 wheels) Financial controller) Transfers: Sit to/from Stand Sit to Stand: Min guard, Min assist           General transfer comment: Pt. with decreased eccentric control with descent needs minA with fatigue; min guard to rise/steady for BUE placement on AD    Ambulation/Gait Ambulation/Gait assistance: Min guard, Min assist Gait Distance (Feet): 300 Feet Assistive device: Rollator (4 wheels) (bari) Gait Pattern/deviations: Step-through pattern, Drifts right/left       General Gait Details: SpO2 WFL when he rests his fingers (noisy signal while pt holding on to 4WW. Pt with flexed trunk and forward head/downward gaze posture, able to improve this with frequent cues. Pt reports discomfort with bari rollator and prefers forearm support of stand-up rollator. Pt states he plans to order a stand-up rollator upon DC as it is more comfortable for him. Intermittent minA for AD use getting around environmental obstacles and mod cues for safety/line awareness as pt has IV pole and multiple IV lines/Tele and pulse ox leads attached this date.       Balance Overall balance assessment: Needs assistance Sitting-balance support: No upper extremity supported Sitting balance-Leahy Scale:  Good     Standing balance support: Single extremity supported Standing balance-Leahy Scale: Fair Standing balance comment: needs at least U UE  support in stance this date.                            Cognition Arousal/Alertness: Awake/alert Behavior During Therapy: Impulsive, Anxious Overall Cognitive Status: Within Functional Limits for tasks assessed                                 General Comments: Unclear of pt baseline, however pt was moderately impulsive and very self-directed. Pt refusing to trial regular RW, taller rollator brought to room for him to use, pt reluctant to use this as he prefers stand-up rollator but was agreeable to try it with encouragement. HoH and pt easily frustrated.        Exercises      General Comments General comments (skin integrity, edema, etc.): R foot 3rd/4th toe redness/edema and pain, RN notified. Pt states this has been happening for past few weeks.      Pertinent Vitals/Pain Pain Assessment Pain Assessment: Faces Faces Pain Scale: Hurts a little bit Pain Location: R foot 4th toe Pain Descriptors / Indicators: Grimacing, Discomfort, Sore, Tender Pain Intervention(s): Monitored during session, Repositioned (RN notified)     PT Goals (current goals can now be found in the care plan section) Acute Rehab PT Goals Patient Stated Goal: to return to independent and walking for exercise PT Goal Formulation: With patient Time For Goal Achievement: 03/26/22 Progress towards PT goals: Progressing toward goals    Frequency    Min 3X/week      PT Plan Discharge plan needs to be updated;Equipment recommendations need to be updated       AM-PAC PT "6 Clicks" Mobility   Outcome Measure  Help needed turning from your back to your side while in a flat bed without using bedrails?: None Help needed moving from lying on your back to sitting on the side of a flat bed without using bedrails?: A Little Help needed moving to and from a bed to a chair (including a wheelchair)?: A Little Help needed standing up from a chair using your arms (e.g., wheelchair or  bedside chair)?: A Little Help needed to walk in hospital room?: A Little Help needed climbing 3-5 steps with a railing? : A Lot 6 Click Score: 18    End of Session   Activity Tolerance: Patient tolerated treatment well Patient left: in bed;with call bell/phone within reach;Other (comment) (pt sitting EOB in care of OT) Nurse Communication: Mobility status;Other (comment) (R foot toe pain) PT Visit Diagnosis: Other abnormalities of gait and mobility (R26.89)     Time: [id number] PT Time Calculation (min) (ACUTE ONLY): 19 min  Charges:  $Gait Training: 8-22 mins                     September Mormile P., PTA Acute Rehabilitation Services Secure Chat Preferred 9a-5:30pm Office: [place] 03/17/2022, 3:27 PM

## 2022-03-17 NOTE — Progress Notes (Signed)
  Progress Note   Patient: Jordan Oconnell [id number] DOB: 30-Jun-1940 DOA: 03/11/2022     6 DOS: the patient was seen and examined on 03/17/2022   Brief hospital course: Mr. Harriel was admitted to the hospital with the working diagnosis of heart failure exacerbation.   82 yo male with the past medical history of atrial fibrillation, heart failure, hypertension, CKD and urothelial carcinoma right kidney who presented with dyspnea and fatigue. Patient reported 7 days of worsening dyspnea, intermittent pre syncope episodes and fatigue. He was evaluated as outpatient and found in uncontrolled atrial fibrillation and heart failure and was referred to the Ed. On his initial physical examination his blood pressure was 104/82, HR 119, RR 20 and 02 saturation 99% on room air. Lungs clear to auscultation, heart with S1 and S2 present, irregularly irregular with no gallops, abdomen with no distention and no lower extremity edema.   Patient was placed on IV amiodarone for rate control.  02/16 direct current cardioversion.  02/18 atrial fibrillation with RVR, transition back to IV amiodarone.      Assessment and Plan: * Atrial fibrillation with rapid ventricular response (HCC) Sp cardioversion. Currently paced ventricular rhythm.  Plan to continue amiodarone and anticoagulation with apexaban. Continue with carvedilol.   Acute on chronic systolic CHF (congestive heart failure) (HCC) Echocardiogram with reduced LV systolic function 20 to 123456, mild LVH, mid and distal inferior wall akinetic. Entire septum, basal inferior segment and apex are hypokinetic. RV with normal systolic function, LA with severe dilatation. SP TAVR.   Urine output is [id number] ml Systolic blood pressure [id number] to 105 mmHg.   Plan to continue with carvedilol Diuretic therapy on hold.   Acute renal failure superimposed on stage 4 chronic kidney disease ([place]) AKI   Renal function with serum cr at 2,49 with K at 4,0 and serum  bicarbonate at 21.  Na 140   Essential hypertension Continue blood pressure control with carvedilol.   Bladder cancer ([place]) Follow up as outpatient        Subjective: Patient with no chest pain, dyspnea or lower extremity edema.   Physical Exam: Vitals:   03/17/22 0047 03/17/22 0500 03/17/22 0857 03/17/22 1245  BP:   127/79 105/81  Pulse: 70  72 75  Resp:      Temp:   97.8 F (36.6 C) 97.6 F (36.4 C)  TempSrc:   Oral Oral  SpO2: 94%  96% 98%  Weight:  82.6 kg    Height:       Neurology awake and alert ENT with mild pallor Cardiovascular with S1 and S2 present and regular with no gallops No JVD No lower extremity edema  Respiratory with no rales or wheezing Abdomen with no distention  Data Reviewed:    Family Communication: no family at the bedside   Disposition: Status is: Inpatient Remains inpatient appropriate because: IV amiodarone   Planned Discharge Destination: Home      Author: Tawni Millers, MD 03/17/2022 1:49 PM  For on call review [email or web address].

## 2022-03-17 NOTE — Assessment & Plan Note (Addendum)
Renal function with serum cr at 2,56 with K at 3,8 and serum bicarbonate at 23, Mg is 2,1 and Na 140, Plan to continue close follow up renal function in am.  Holding diuretic therapy for now, probably start furosemide 40 mg daily within the next 24 to 48 hrs   Anemia of chronic renal disease with stable hgb at 9,6

## 2022-03-17 NOTE — Assessment & Plan Note (Signed)
Continue blood pressure control with carvedilol.  

## 2022-03-17 NOTE — Assessment & Plan Note (Addendum)
Sp cardioversion. This morning had a brief period of atrial fibrillation with RVR.  Telemetry with combination of atrial -ventricular pacing, a sensing and ventricular pacing and sinus rhythm.   Continue amiodarone drip for rhythm control. Anticoagulation with apixaban.

## 2022-03-17 NOTE — Hospital Course (Addendum)
Mr. Buchberger was admitted to the hospital with the working diagnosis of heart failure exacerbation.   82 yo male with the past medical history of atrial fibrillation, heart failure, hypertension, CKD and urothelial carcinoma right kidney who presented with dyspnea and fatigue. Patient reported 7 days of worsening dyspnea, intermittent pre syncope episodes and fatigue. He was evaluated as outpatient and found in uncontrolled atrial fibrillation and heart failure and was referred to the Ed. On his initial physical examination his blood pressure was 104/82, HR 119, RR 20 and 02 saturation 99% on room air. Lungs clear to auscultation, heart with S1 and S2 present, irregularly irregular with no gallops, abdomen with no distention and no lower extremity edema.   Na 138, K 4,8 CL 108 bicarbonate 17 glucose 93 bun 68 cr 3,0 BNP 3,602 High sensitive troponin 152, 133  Wbc 7,0 hgb 10,0 plt 109  TSH 4,6  Chest radiograph with mild cardiomegaly, bilateral hilar vascular congestion, with bilateral atelectasis at bases, has pacemaker in place with one atrial and one ventricular lead.   EKG 116 bpm, right axis deviation, qtc 565, left bundle brunch block, sinus rhythm with no significant ST segment changes, negative T wave lead II, III, AvF.   Patient was placed on IV amiodarone for rate control.  02/16 direct current cardioversion.  02/18 atrial fibrillation with RVR, transition back to IV amiodarone.  02/22 patient has remained on sinus rhythm. Plan to continue with oral amiodarone and oral carvedilol.  02/23 patient had a brief period of RVR atrial fibrillation, that converted to ventricular pacing with improvement in heart rate.  02/24 continue to have intermittent atrial fibrillation with RVR, now back on amiodarone drip.  02/25 patient with no further atrial fibrillation, transitioned back to oral amiodarone.  02/26 Improved heart rate controlled, plan to continue oral amiodarone and follow up as  outpatient.

## 2022-03-17 NOTE — Plan of Care (Signed)
  Problem: Activity: Goal: Ability to tolerate increased activity will improve Outcome: Progressing   Problem: Cardiac: Goal: Ability to achieve and maintain adequate cardiopulmonary perfusion will improve Outcome: Progressing   Problem: Clinical Measurements: Goal: Respiratory complications will improve Outcome: Progressing Goal: Cardiovascular complication will be avoided Outcome: Progressing

## 2022-03-18 DIAGNOSIS — I5023 Acute on chronic systolic (congestive) heart failure: Secondary | ICD-10-CM | POA: Diagnosis not present

## 2022-03-18 DIAGNOSIS — N179 Acute kidney failure, unspecified: Secondary | ICD-10-CM | POA: Diagnosis not present

## 2022-03-18 DIAGNOSIS — N184 Chronic kidney disease, stage 4 (severe): Secondary | ICD-10-CM | POA: Diagnosis not present

## 2022-03-18 DIAGNOSIS — C671 Malignant neoplasm of dome of bladder: Secondary | ICD-10-CM | POA: Diagnosis not present

## 2022-03-18 DIAGNOSIS — I4891 Unspecified atrial fibrillation: Secondary | ICD-10-CM | POA: Diagnosis not present

## 2022-03-18 DIAGNOSIS — I4819 Other persistent atrial fibrillation: Secondary | ICD-10-CM | POA: Diagnosis not present

## 2022-03-18 LAB — BASIC METABOLIC PANEL
Anion gap: 10 (ref 5–15)
BUN: 41 mg/dL — ABNORMAL HIGH (ref 8–23)
CO2: 21 mmol/L — ABNORMAL LOW (ref 22–32)
Calcium: 8.6 mg/dL — ABNORMAL LOW (ref 8.9–10.3)
Chloride: 109 mmol/L (ref 98–111)
Creatinine, Ser: 2.52 mg/dL — ABNORMAL HIGH (ref 0.61–1.24)
GFR, Estimated: 25 mL/min — ABNORMAL LOW (ref >60)
Glucose, Bld: 133 mg/dL — ABNORMAL HIGH (ref 70–99)
Potassium: 3.9 mmol/L (ref 3.5–5.1)
Sodium: 140 mmol/L (ref 135–145)

## 2022-03-18 LAB — COOXEMETRY PANEL
Carboxyhemoglobin: 2 % — ABNORMAL HIGH (ref 0.5–1.5)
Methemoglobin: <0.7 % (ref 0.0–1.5)
O2 Saturation: 65.6 %
Total hemoglobin: 8.9 g/dL — ABNORMAL LOW (ref 12.0–16.0)

## 2022-03-18 MED ORDER — AMIODARONE HCL 200 MG PO TABS
400.0000 mg | ORAL_TABLET | Freq: Two times a day (BID) | ORAL | Status: DC
  Administered 2022-03-18 – 2022-03-19 (×3): 400 mg via ORAL
  Filled 2022-03-18 (×3): qty 2

## 2022-03-18 MED ORDER — ORAL CARE MOUTH RINSE: 15.0000 mL | OROMUCOSAL | Status: DC | PRN

## 2022-03-18 NOTE — TOC Progression Note (Signed)
Transition of Care [facility]) - Progression Note    Patient Details  Name: Jordan Oconnell MRN: [id number] Date of Birth: January 08, 1941  [facility]) CM/SW Contact  Erenest Rasher, RN Phone Number: [phone] 03/18/2022, 11:24 AM  Clinical Narrative:     Spoke to pt and states he does want [place] with seat. Offered choice for [facility]. Pt agreeable to [place] for [place]. Contacted rep, Claiborne Billings with new referral.  [place] rep for RW with seat.   Expected Discharge Plan: [place] Barriers to Discharge: Continued Medical Work up  Expected Discharge Plan and Services   Discharge Planning Services: CM Consult Post Acute Care Choice: Echo arrangements for the past 2 months: Single Family Home                                       Social Determinants of Health (SDOH) Interventions SDOH Screenings   Food Insecurity: No Food Insecurity (03/13/2022)  Housing: Low Risk  (03/13/2022)  Transportation Needs: No Transportation Needs (03/13/2022)  Utilities: Not At Risk (03/13/2022)  Tobacco Use: Low Risk  (03/14/2022)    Readmission Risk Interventions     No data to display

## 2022-03-18 NOTE — Progress Notes (Addendum)
Advanced Heart Failure Rounding Note  PCP-Cardiologist: Sanda Klein, MD   Subjective:   CVP remains low. Diuretics have been on hold.    CO-OX 66%.   Denies SOB.    Objective:   Weight Range: 82.5 kg Body mass index is 24 kg/m.   Vital Signs:   Temp:  [97.6 F (36.4 C)-97.7 F (36.5 C)] 97.6 F (36.4 C) (02/22 0450) Pulse Rate:  [68-75] 71 (02/22 0452) Resp:  [20-22] 21 (02/22 0452) BP: (105-129)/(67-81) 123/75 (02/22 0450) SpO2:  [90 %-98 %] 90 % (02/22 0452) Weight:  [82.5 kg] 82.5 kg (02/22 0452) Last BM Date : 03/17/22  Weight change: Filed Weights   03/16/22 0354 03/17/22 0500 03/18/22 0452  Weight: 84.7 kg 82.6 kg 82.5 kg    Intake/Output:   Intake/Output Summary (Last 24 hours) at 03/18/2022 0950 Last data filed at 03/18/2022 0500 Gross per 24 hour  Intake 387.62 ml  Output 1325 ml  Net -937.38 ml    CVP 1-2   Physical Exam  General: In bed.  No resp difficulty HEENT: normal Neck: supple. no JVD. Carotids 2+ bilat; no bruits. No lymphadenopathy or thryomegaly appreciated. Cor: PMI nondisplaced. Regular rate & rhythm. No rubs, gallops or murmurs. Lungs: clear Abdomen: soft, nontender, nondistended. No hepatosplenomegaly. No bruits or masses. Good bowel sounds. + Urostomy  Extremities: no cyanosis, clubbing, rash, edema. RUE PICC  Neuro: alert & orientedx3, cranial nerves grossly intact. moves all 4 extremities w/o difficulty. Affect pleasant  Telemetry   A V Paced 70s   EKG    V paced   Labs    CBC No results for input(s): "WBC", "NEUTROABS", "HGB", "HCT", "MCV", "PLT" in the last 72 hours.  Basic Metabolic Panel Recent Labs    03/16/22 0600 03/17/22 0517 03/18/22 0315  NA 139 140 140  K 4.2 4.0 3.9  CL 108 110 109  CO2 22 21* 21*  GLUCOSE 108* 102* 133*  BUN 51* 45* 41*  CREATININE 2.43* 2.49* 2.52*  CALCIUM 8.4* 8.7* 8.6*  MG 2.0  --   --     BNP: BNP (last 3 results) Recent Labs    03/11/22 1811 03/13/22 0820   BNP 3,602.4* 1,669.7*     Medications:     Scheduled Medications:  ALPRAZolam  0.25 mg Oral QHS   apixaban  2.5 mg Oral BID   carvedilol  3.125 mg Oral BID WC   Chlorhexidine Gluconate Cloth  6 each Topical Daily   vitamin B-12  1,000 mcg Oral Daily   ferrous sulfate  325 mg Oral Q breakfast   latanoprost  1 drop Both Eyes QHS   levothyroxine  50 mcg Oral QHS   sodium chloride flush  10-40 mL Intracatheter Q12H   Infusions:  amiodarone 30 mg/hr (03/18/22 0308)    PRN Medications: alum & mag hydroxide-simeth, levalbuterol, ondansetron **OR** ondansetron (ZOFRAN) IV, mouth rinse, sodium chloride flush    Patient Profile   Mr Church is an 82 yo M with 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   Assessment/Plan  1. Atrial fibrillation/flutter: Persistent, admitted with RVR.  30% AF/AFL by device interrogation over the last few months.  Now on amiodarone gtt and maintaining NSR after DCCV 2/6 and again 2/16.  Possible that fall in EF is due to tachy-mediated CMP with loss of left  bundle pacing.  -Briefly on po amio but went back in A fib. IV amio restarted. Maintaining SR. Stop IV amio. Start amio 200 mg twice a day. - Continue apixaban, dosed for renal function/age at 2.5 bid.  2. AKI on CKD stage IV: Baseline creatinine 2.5, up to 3.1 this admission.  Lower today at 2.7 => 2.43=>2.49 =>2.5 . Holding diuretics.  .  Possible cardiorenal component.   3. Acute on chronic systolic CHF: Probably nonischemic cardiomyopathy.  Cath prior to TAVR in 9/22 showed 90% stenosis in D1 and 60% ramus stenosis, not extensive CAD. Echo in 1/23 post-TAVR with EF 55%.  Echo in 1/24 with EF down to 20-25%, mildly decreased RV systolic function, and normal bioprosthetic aortic valve.   Atrial fibrillation burden is now greater, fall in EF may be due to tachycardia-mediated CMP (was in RVR at admission) combined with loss of left bundle pacing when in RVR.  He was diuresed some this admission then diuretics stopped with rising creatinine and concern for low output.  CO-OX 65% - CVP remains low. Hold off on diuretics.  - He is left bundle pacing when not in RVR, so need to keep him in NSR.  - GDMT will be limited by elevated creatinine.  - Continue Coreg 3.125 mg bid.   4. Bradycardia: S/p [employer] PPM with LB lead placed post-TAVR.  5. Severe aortic stenosis: S/p TAVR in 1/23.  Bioprosthetic aortic valve stable on 1/24 echo.   6. Fe deficiency anemia: Has had Feraheme.    Stop IV amio. Start po amio.   Length of Stay: 7  Amy Clegg, NP  03/18/2022, 9:50 AM  Advanced Heart Failure Team Pager [phone] (M-F; 7a - 5p)  Please contact [facility] for night-coverage after hours (5p -7a ) and weekends on [email or web address]  Patient seen with NP, agree with the above note.   He remains in NSR today on IV amiodarone.  Creatinine stable at 2.5.  Co-ox 66%.  CVP < 5.   General: NAD Neck: No JVD, no thyromegaly or thyroid nodule.  Lungs: Clear to auscultation bilaterally with normal respiratory effort. CV: Nondisplaced PMI.  Heart regular S1/S2, no S3/S4, no murmur.  No peripheral edema.   Abdomen: Soft, nontender, no hepatosplenomegaly, no distention.  Skin: Intact without lesions or rashes.  Neurologic: Alert and oriented x 3.  Psych: Normal affect. Extremities: No clubbing or cyanosis.  HEENT: Normal.   Stop IV amiodarone, start po amiodarone 200 mg bid today.    With CKD IV, we are limited with GDMT.  He does not need a diuretic.  He is appropriately left bundle pacing with NSR.   If he stays in NSR on po amiodarone overnight, can discharge in am.   Loralie Champagne 03/18/2022 10:49 AM

## 2022-03-18 NOTE — Progress Notes (Addendum)
  Progress Note   Patient: Jordan Oconnell [id number] DOB: 10-08-1940 DOA: 03/11/2022     7 DOS: the patient was seen and examined on 03/18/2022   Brief hospital course: Mr. Winkle was admitted to the hospital with the working diagnosis of heart failure exacerbation.   82 yo male with the past medical history of atrial fibrillation, heart failure, hypertension, CKD and urothelial carcinoma right kidney who presented with dyspnea and fatigue. Patient reported 7 days of worsening dyspnea, intermittent pre syncope episodes and fatigue. He was evaluated as outpatient and found in uncontrolled atrial fibrillation and heart failure and was referred to the Ed. On his initial physical examination his blood pressure was 104/82, HR 119, RR 20 and 02 saturation 99% on room air. Lungs clear to auscultation, heart with S1 and S2 present, irregularly irregular with no gallops, abdomen with no distention and no lower extremity edema.   Patient was placed on IV amiodarone for rate control.  02/16 direct current cardioversion.  02/18 atrial fibrillation with RVR, transition back to IV amiodarone.      Assessment and Plan: * Atrial fibrillation with rapid ventricular response (HCC) Sp cardioversion. Currently paced ventricular rhythm.  Plan to continue amiodarone and anticoagulation with apexaban. Transition to oral amiodarone per cardiology recommendations.  Continue with carvedilol.   Acute on chronic systolic CHF (congestive heart failure) (HCC) Echocardiogram with reduced LV systolic function 20 to 123456, mild LVH, mid and distal inferior wall akinetic. Entire septum, basal inferior segment and apex are hypokinetic. RV with normal systolic function, LA with severe dilatation. SP TAVR.   Urine output is [id number] ml Systolic blood pressure [id number] to 129 mmHg.   Plan to continue with carvedilol Diuretic therapy on hold.   Acute renal failure superimposed on stage 4 chronic kidney disease ([place]) Today  renal function with serum cr at 2,52 with K at 3,9 and serum bicarbonate at 21.  Continue to hold on furosemide and will follow up renal function in am.  Hold on oral sodium bicarbonate for now.  Anemia of chronic renal disease with stable hgb at 9,6   Essential hypertension Continue blood pressure control with carvedilol.   Bladder cancer ([place]) Follow up as outpatient        Subjective: Patient is feeling well, no chest pain or dyspnea, no edema or palpitations.   Physical Exam: Vitals:   03/17/22 1934 03/17/22 2344 03/18/22 0450 03/18/22 0452  BP: 122/67 129/73 123/75   Pulse: 70 68 73 71  Resp: 20 20 (!) 22 (!) 21  Temp: 97.6 F (36.4 C) 97.6 F (36.4 C) 97.6 F (36.4 C)   TempSrc: Oral Oral Oral   SpO2: 97% 97% 92% 90%  Weight:    82.5 kg  Height:       Neurology awake and alert ENT With mild pallor Cardiovascular with S1 and S2 present and regular with no gallops, rubs or murmurs No JVD No lower extremity edema Respiratory with no rales or wheezing, no rhonchi Abdomen with no distention  Data Reviewed:    Family Communication: no family at the bedside   Disposition: Status is: Inpatient Remains inpatient appropriate because: IV amiodarone for atrial fibrillation   Planned Discharge Destination: Home    Author: Tawni Millers, MD 03/18/2022 9:32 AM  For on call review [email or web address].

## 2022-03-19 DIAGNOSIS — C671 Malignant neoplasm of dome of bladder: Secondary | ICD-10-CM | POA: Diagnosis not present

## 2022-03-19 DIAGNOSIS — I4819 Other persistent atrial fibrillation: Secondary | ICD-10-CM | POA: Diagnosis not present

## 2022-03-19 DIAGNOSIS — I5023 Acute on chronic systolic (congestive) heart failure: Secondary | ICD-10-CM | POA: Diagnosis not present

## 2022-03-19 DIAGNOSIS — I4891 Unspecified atrial fibrillation: Secondary | ICD-10-CM | POA: Diagnosis not present

## 2022-03-19 DIAGNOSIS — N179 Acute kidney failure, unspecified: Secondary | ICD-10-CM | POA: Diagnosis not present

## 2022-03-19 DIAGNOSIS — N184 Chronic kidney disease, stage 4 (severe): Secondary | ICD-10-CM | POA: Diagnosis not present

## 2022-03-19 LAB — MAGNESIUM: Magnesium: 2.1 mg/dL (ref 1.7–2.4)

## 2022-03-19 LAB — BASIC METABOLIC PANEL
Anion gap: 8 (ref 5–15)
BUN: 38 mg/dL — ABNORMAL HIGH (ref 8–23)
CO2: 23 mmol/L (ref 22–32)
Calcium: 8.8 mg/dL — ABNORMAL LOW (ref 8.9–10.3)
Chloride: 109 mmol/L (ref 98–111)
Creatinine, Ser: 2.56 mg/dL — ABNORMAL HIGH (ref 0.61–1.24)
GFR, Estimated: 24 mL/min — ABNORMAL LOW (ref >60)
Glucose, Bld: 97 mg/dL (ref 70–99)
Potassium: 3.8 mmol/L (ref 3.5–5.1)
Sodium: 140 mmol/L (ref 135–145)

## 2022-03-19 LAB — COOXEMETRY PANEL
Carboxyhemoglobin: 2 % — ABNORMAL HIGH (ref 0.5–1.5)
Methemoglobin: <0.7 % (ref 0.0–1.5)
O2 Saturation: 60.2 %
Total hemoglobin: 9.2 g/dL — ABNORMAL LOW (ref 12.0–16.0)

## 2022-03-19 MED ORDER — AMIODARONE IV BOLUS ONLY 150 MG/100ML
150.0000 mg | Freq: Once | INTRAVENOUS | Status: AC
  Administered 2022-03-19: 150 mg via INTRAVENOUS
  Filled 2022-03-19: qty 100

## 2022-03-19 MED ORDER — AMIODARONE HCL 200 MG PO TABS: 200.0000 mg | ORAL_TABLET | Freq: Two times a day (BID) | ORAL | Status: DC

## 2022-03-19 MED ORDER — AMIODARONE HCL 200 MG PO TABS: 400.0000 mg | ORAL_TABLET | Freq: Two times a day (BID) | ORAL | Status: DC

## 2022-03-19 MED ORDER — POTASSIUM CHLORIDE CRYS ER 20 MEQ PO TBCR
40.0000 meq | EXTENDED_RELEASE_TABLET | Freq: Once | ORAL | Status: AC
  Administered 2022-03-19: 40 meq via ORAL
  Filled 2022-03-19: qty 2

## 2022-03-19 MED ORDER — AMIODARONE HCL IN DEXTROSE 360-4.14 MG/200ML-% IV SOLN
30.0000 mg/h | INTRAVENOUS | Status: DC
  Administered 2022-03-19 – 2022-03-21 (×4): 30 mg/h via INTRAVENOUS
  Filled 2022-03-19 (×4): qty 200

## 2022-03-19 MED ORDER — CARVEDILOL 6.25 MG PO TABS
6.2500 mg | ORAL_TABLET | Freq: Two times a day (BID) | ORAL | Status: DC
  Administered 2022-03-19 – 2022-03-22 (×6): 6.25 mg via ORAL
  Filled 2022-03-19 (×6): qty 1

## 2022-03-19 MED ORDER — LOPERAMIDE HCL 2 MG PO CAPS
2.0000 mg | ORAL_CAPSULE | ORAL | Status: DC | PRN
  Administered 2022-03-19 – 2022-03-21 (×3): 2 mg via ORAL
  Filled 2022-03-19 (×3): qty 1

## 2022-03-19 NOTE — Progress Notes (Signed)
Progress Note   Patient: Jordan Oconnell [id number] DOB: [birth year] DOA: 03/11/2022     8 DOS: the patient was seen and examined on 03/19/2022   Brief hospital course: Mr. Holstad was admitted to the hospital with the working diagnosis of heart failure exacerbation.   82 yo male with the past medical history of atrial fibrillation, heart failure, hypertension, CKD and urothelial carcinoma right kidney who presented with dyspnea and fatigue. Patient reported 7 days of worsening dyspnea, intermittent pre syncope episodes and fatigue. He was evaluated as outpatient and found in uncontrolled atrial fibrillation and heart failure and was referred to the Ed. On his initial physical examination his blood pressure was 104/82, HR 119, RR 20 and 02 saturation 99% on room air. Lungs clear to auscultation, heart with S1 and S2 present, irregularly irregular with no gallops, abdomen with no distention and no lower extremity edema.   Na 138, K 4,8 CL 108 bicarbonate 17 glucose 93 bun 68 cr 3,0 BNP 3,602 High sensitive troponin 152, 133  Wbc 7,0 hgb 10,0 plt 109  TSH 4,6  Chest radiograph with mild cardiomegaly, bilateral hilar vascular congestion, with bilateral atelectasis at bases, has pacemaker in place with one atrial and one ventricular lead.   EKG 116 bpm, right axis deviation, qtc 565, left bundle brunch block, sinus rhythm with no significant ST segment changes, negative T wave lead II, III, AvF.   Patient was placed on IV amiodarone for rate control.  02/16 direct current cardioversion.  02/18 atrial fibrillation with RVR, transition back to IV amiodarone.  02/22 patient has remained on sinus rhythm. Plan to continue with oral amiodarone and oral carvedilol.  02/23 patient had a brief period of RVR atrial fibrillation, that converted to ventricular pacing with improvement in heart rate.   Assessment and Plan: * Atrial fibrillation with rapid ventricular response (HCC) Sp  cardioversion. Patient had RVR this am with no symptoms, that converted to ventricular paced rhythm with improvement in heart rate.   Plan to continue amiodarone ( he received 400 mg this am)  and anticoagulation with apexaban. Continue with carvedilol, possible increase dose to prevent RVR).   Acute on chronic systolic CHF (congestive heart failure) (HCC) Echocardiogram with reduced LV systolic function 20 to 123456, mild LVH, mid and distal inferior wall akinetic. Entire septum, basal inferior segment and apex are hypokinetic. RV with normal systolic function, LA with severe dilatation. SP TAVR.   Urine output is 0000000 ml Systolic blood pressure 123XX123 mmHg.   Plan to continue with carvedilol Diuretic therapy on hold.   Acute renal failure superimposed on stage 4 chronic kidney disease ([place]) Renal function with serum cr at 2,56 with K at 3,8 and serum bicarbonate at 23, Mg is 2,1 and Na 140, Plan to continue close follow up renal function in am.  Holding diuretic therapy for now, probably start furosemide 40 mg daily within the next 24 to 48 hrs   Anemia of chronic renal disease with stable hgb at 9,6   Essential hypertension Continue blood pressure control with carvedilol.   Bladder cancer ([place]) Follow up as outpatient        Subjective: Patient with no chest pain or dyspnea, no edema, no palpitations.   Physical Exam: Vitals:   03/19/22 0000 03/19/22 0352 03/19/22 0513 03/19/22 0808  BP:  137/75 137/84 131/76  Pulse:  70  70  Resp: '20 20 18 19  '$ Temp:  98.1 F (36.7 C) 97.6 F (36.4 C) 97.9 F (36.6  C)  TempSrc:  Oral Oral Oral  SpO2:  95%  93%  Weight:   81.3 kg   Height:       Neurology awake and alert ENT with mild pallor Cardiovascular with S1 and S2 present and regular with no gallops, rubs or murmurs Respiratory with no rales or wheezing Abdomen with no distention  No lower extremity edema  Data Reviewed:    Family Communication: no family at the  bedside   Disposition: Status is: Inpatient Remains inpatient appropriate because: telemetry monitoring   Planned Discharge Destination: Home      Author: Tawni Millers, MD 03/19/2022 10:06 AM  For on call review [email or web address].

## 2022-03-19 NOTE — Progress Notes (Signed)
Occupational Therapy Treatment Patient Details Name: Jordan Oconnell MRN: [id number] DOB: 02-Apr-1940 Today's Date: 03/19/2022   History of present illness 82 y.o. male admitted with heart failure and atrial flutter. PMH: Chronic systolic and diastolic heart failure, aortic stenosis status post TAVR, sick sinus syndrome status post dual-chamber pacemaker, urothelial cancer, CKD 4, and paroxysmal atrial fibrillation/flutter.   OT comments  Pt. Seen for skilled OT session.  Session completed bed level secondary to pt. In afib with continuous elevated HR along with pt. C/o upset stomach.  Focused on energy conservation strategies and how to incorporate during adls.  Handouts provided and reviewed with pt..  pt. Verbalized understanding and was able to provide  examples of implementation at home.  States he takes rest breaks when needed.  Reports having family in the medical field that are coming to stay with him so he feels he will be in good hands.  Answered all questions about hh therapies and he wanted to understand how that works at his home.  Will progress with adls next session as pt. Able.  Agree with current d/c recommendations.     Recommendations for follow up therapy are one component of a multi-disciplinary discharge planning process, led by the attending physician.  Recommendations may be updated based on patient status, additional functional criteria and insurance authorization.    Follow Up Recommendations  Home health OT     Assistance Recommended at Discharge Intermittent Supervision/Assistance  Patient can return home with the following  A little help with bathing/dressing/bathroom;A little help with walking and/or transfers;Assistance with cooking/housework   Equipment Recommendations  Tub/shower bench    Recommendations for Other Services      Precautions / Restrictions Precautions Precautions: Fall Precaution Comments: R foot 3/4th toe edema/redness, bowel incontinence  (helpful to bring briefs)       Mobility Bed Mobility                    Transfers                         Balance                                           ADL either performed or assessed with clinical judgement   ADL Overall ADL's : Needs assistance/impaired                                       General ADL Comments: pt. requested bed level today secondary to stomach issues he did not want to be away from the bed pan and also pt. in a-fib with elevated HR at rest.  focused on energy conservation strategies with handouts provided also.    Extremity/Trunk Assessment              Vision       Perception     Praxis      Cognition Arousal/Alertness: Awake/alert Behavior During Therapy: WFL for tasks assessed/performed Overall Cognitive Status: Within Functional Limits for tasks assessed                                          Exercises  Shoulder Instructions       General Comments      Pertinent Vitals/ Pain       Pain Assessment Pain Assessment: No/denies pain Pain Descriptors / Indicators: Other (Comment) (c/o diarrhea) Pain Intervention(s): Limited activity within patient's tolerance  Home Living                                          Prior Functioning/Environment              Frequency  Min 2X/week        Progress Toward Goals  OT Goals(current goals can now be found in the care plan section)  Progress towards OT goals: Progressing toward goals     Plan Frequency remains appropriate;Discharge plan needs to be updated    Co-evaluation                 AM-PAC OT "6 Clicks" Daily Activity     Outcome Measure   Help from another person eating meals?: None Help from another person taking care of personal grooming?: A Little Help from another person toileting, which includes using toliet, bedpan, or urinal?: A Little Help from  another person bathing (including washing, rinsing, drying)?: A Little Help from another person to put on and taking off regular upper body clothing?: A Little Help from another person to put on and taking off regular lower body clothing?: A Little 6 Click Score: 19    End of Session    OT Visit Diagnosis: Unsteadiness on feet (R26.81);Other abnormalities of gait and mobility (R26.89)   Activity Tolerance Patient tolerated treatment well   Patient Left in bed;with call bell/phone within reach   Nurse Communication          Time: [id number] OT Time Calculation (min): 17 min  Charges: OT General Charges $OT Visit: 1 Visit OT Treatments $Self Care/Home Management : 8-22 mins  Sonia Baller, COTA/L Acute Rehabilitation [phone]   Clearnce Sorrel Lorraine-COTA/L 03/19/2022, 12:44 PM

## 2022-03-19 NOTE — Care Management Important Message (Signed)
Important Message  Patient Details  Name: [name] MRN: [id number] Date of Birth: 06/22/1940   Medicare Important Message Given:  Yes     Shelda Altes 03/19/2022, 10:54 AM

## 2022-03-19 NOTE — Progress Notes (Signed)
PT Cancellation Note  Patient Details Name: JON BASCH MRN: [id number] DOB: 1940-10-12   Cancelled Treatment:    Reason Eval/Treat Not Completed: Medical issues which prohibited therapy holding PT today- while at rest, HR still in Afib going up to 114BPM (max observed by PT) and with on and off V-tach pattern with good signal on monitor. Will plan on returning in AM in case of DC over weekend.   Deniece Ree PT DPT PN2

## 2022-03-19 NOTE — Progress Notes (Addendum)
Advanced Heart Failure Rounding Note  PCP-Cardiologist: Sanda Klein, MD   Subjective:    In Afib, low 100s.   Co-ox 60%. CVP 2. Wt stable despite diuretic hold. Denies dyspnea.   Scr stable 2.56   Had diarrhea last PM. No no recurrence this morning. Feeling better.   K 3.8 Mg 2.1   Objective:   Weight Range: 81.3 kg Body mass index is 23.66 kg/m.   Vital Signs:   Temp:  [97.6 F (36.4 C)-98.5 F (36.9 C)] 97.9 F (36.6 C) (02/23 0808) Pulse Rate:  [70-73] 70 (02/23 0808) Resp:  [18-20] 19 (02/23 0808) BP: (109-137)/(40-84) 131/76 (02/23 0808) SpO2:  [93 %-100 %] 93 % (02/23 0808) Weight:  [81.3 kg] 81.3 kg (02/23 0513) Last BM Date : 03/17/22  Weight change: Filed Weights   03/17/22 0500 03/18/22 0452 03/19/22 0513  Weight: 82.6 kg 82.5 kg 81.3 kg    Intake/Output:   Intake/Output Summary (Last 24 hours) at 03/19/2022 0953 Last data filed at 03/19/2022 0510 Gross per 24 hour  Intake 150 ml  Output 750 ml  Net -600 ml      Physical Exam   CVP 2 General:  Well appearing. No respiratory difficulty HEENT: normal Neck: supple. no JVD. Carotids 2+ bilat; no bruits. No lymphadenopathy or thyromegaly appreciated. Cor: PMI nondisplaced. Irregularly irregular rhythm and rate. No rubs, gallops or murmurs. Lungs: clear Abdomen: soft, nontender, nondistended. No hepatosplenomegaly. No bruits or masses. Good bowel sounds. Extremities: no cyanosis, clubbing, rash, edema Neuro: alert & oriented x 3, cranial nerves grossly intact. moves all 4 extremities w/o difficulty. Affect pleasant.   Telemetry   Afib low 100s   EKG    N/A   Labs    CBC No results for input(s): "WBC", "NEUTROABS", "HGB", "HCT", "MCV", "PLT" in the last 72 hours.  Basic Metabolic Panel Recent Labs    03/18/22 0315 03/19/22 0500  NA 140 140  K 3.9 3.8  CL 109 109  CO2 21* 23  GLUCOSE 133* 97  BUN 41* 38*  CREATININE 2.52* 2.56*  CALCIUM 8.6* 8.8*  MG  --  2.1     BNP: BNP (last 3 results) Recent Labs    03/11/22 1811 03/13/22 0820  BNP 3,602.4* 1,669.7*     Medications:     Scheduled Medications:  ALPRAZolam  0.25 mg Oral QHS   amiodarone  200 mg Oral BID   apixaban  2.5 mg Oral BID   carvedilol  3.125 mg Oral BID WC   Chlorhexidine Gluconate Cloth  6 each Topical Daily   vitamin B-12  1,000 mcg Oral Daily   ferrous sulfate  325 mg Oral Q breakfast   latanoprost  1 drop Both Eyes QHS   levothyroxine  50 mcg Oral QHS   sodium chloride flush  10-40 mL Intracatheter Q12H   Infusions:    PRN Medications: alum & mag hydroxide-simeth, levalbuterol, ondansetron **OR** ondansetron (ZOFRAN) IV, mouth rinse, sodium chloride flush    Patient Profile   Jordan Oconnell is an 82 yo M with 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   Assessment/Plan   1. Atrial fibrillation/flutter: Persistent, admitted with RVR.  30% AF/AFL by device interrogation over the last few months. Now on amiodarone gtt and maintaining NSR after DCCV 2/6 and again 2/16.  Possible that fall in  EF is due to tachy-mediated CMP with loss of left bundle pacing. He was back in atrial fibrillation, low 100s  - Transition to PO Amiodarone 400 mg bid  - Continue apixaban, dosed for renal function/age at 2.5 bid.  2. AKI on CKD stage IV: Baseline creatinine 2.5, up to 3.1 this admission.  Stable at 2.56 today.  Possible cardiorenal component.  He is not volume overloaded, CVP low.  - Hold diuretics today.  3. Acute on chronic systolic CHF: Probably nonischemic cardiomyopathy.  Cath prior to TAVR in 9/22 showed 90% stenosis in D1 and 60% ramus stenosis, not extensive CAD. Echo in 1/23 post-TAVR with EF 55%.  Echo in 1/24 with EF down to 20-25%, mildly decreased RV  systolic function, and normal bioprosthetic aortic valve.  Atrial fibrillation burden is now greater, fall in EF may be due to tachycardia-mediated CMP (was in RVR at admission) combined with loss of left bundle pacing when in RVR.  He was diuresed some this admission then diuretics stopped with rising creatinine and concern for low output.  CVP low today with co-ox 60%.  Creatinine stable 2.56  - CVP low, no diuretics.  - He is left bundle pacing when not in RVR, will need to keep in NSR. Amio per above  - Continue Coreg 3.125 mg bid.  - GDMT will be limited by elevated creatinine.   4. Bradycardia: S/p [employer] PPM with LB lead placed post-TAVR.  5. Severe aortic stenosis: S/p TAVR in 1/23.  Bioprosthetic aortic valve stable on 1/24 echo.   6. Fe deficiency anemia: Has had Feraheme.     Length of Stay: [address], [place]  03/19/2022, 9:53 AM  Advanced Heart Failure Team Pager [phone] (M-F; 7a - 5p)  Please contact [facility] for night-coverage after hours (5p -7a ) and weekends on [email or web address]  Patient seen with PA, agree with the above note.   He is back in atrial fibrillation rate 100s at rest. CVP 2.  Co-ox 60%, creatinine stable 2.56.   Feels ok but anxious about being back in AF.   General: NAD Neck: No JVD, no thyromegaly or thyroid nodule.  Lungs: Clear to auscultation bilaterally with normal respiratory effort. CV: Nondisplaced PMI.  Heart irregular S1/S2, no S3/S4, no murmur.  No peripheral edema.   Abdomen: Soft, nontender, no hepatosplenomegaly, no distention.  Skin: Intact without lesions or rashes.  Neurologic: Alert and oriented x 3.  Psych: Normal affect. Extremities: No clubbing or cyanosis.  HEENT: Normal.   AF recurred overnight, remains in AF today.  Need to keep him in NSR to allow left bundle pacing, ?cardiomyopathy due to rapid AF + dyssynchrony with loss of left bundle pacing.  - Bolus of IV amiodarone and resume amiodarone 30 mg/hr  gtt.  Need to get more amiodarone into him.  - Increase Coreg to 6.25 mg bid.   With CKD IV (stable), we are limited with GDMT.  He does not need Lasix with CVP 2.  When in NSR, he appropriately left bundle paces.   Need to mobilize, ok with mildly elevated HR.   Loralie Champagne 03/19/2022 12:59 PM

## 2022-03-20 DIAGNOSIS — I5023 Acute on chronic systolic (congestive) heart failure: Secondary | ICD-10-CM | POA: Diagnosis not present

## 2022-03-20 DIAGNOSIS — N184 Chronic kidney disease, stage 4 (severe): Secondary | ICD-10-CM | POA: Diagnosis not present

## 2022-03-20 DIAGNOSIS — I4819 Other persistent atrial fibrillation: Secondary | ICD-10-CM | POA: Diagnosis not present

## 2022-03-20 DIAGNOSIS — N179 Acute kidney failure, unspecified: Secondary | ICD-10-CM | POA: Diagnosis not present

## 2022-03-20 DIAGNOSIS — I4891 Unspecified atrial fibrillation: Secondary | ICD-10-CM | POA: Diagnosis not present

## 2022-03-20 DIAGNOSIS — C671 Malignant neoplasm of dome of bladder: Secondary | ICD-10-CM | POA: Diagnosis not present

## 2022-03-20 LAB — BASIC METABOLIC PANEL
Anion gap: 9 (ref 5–15)
BUN: 38 mg/dL — ABNORMAL HIGH (ref 8–23)
CO2: 20 mmol/L — ABNORMAL LOW (ref 22–32)
Calcium: 8.7 mg/dL — ABNORMAL LOW (ref 8.9–10.3)
Chloride: 109 mmol/L (ref 98–111)
Creatinine, Ser: 2.44 mg/dL — ABNORMAL HIGH (ref 0.61–1.24)
GFR, Estimated: 26 mL/min — ABNORMAL LOW (ref >60)
Glucose, Bld: 113 mg/dL — ABNORMAL HIGH (ref 70–99)
Potassium: 4.3 mmol/L (ref 3.5–5.1)
Sodium: 138 mmol/L (ref 135–145)

## 2022-03-20 LAB — MAGNESIUM: Magnesium: 2.1 mg/dL (ref 1.7–2.4)

## 2022-03-20 LAB — COOXEMETRY PANEL
Carboxyhemoglobin: 1.9 % — ABNORMAL HIGH (ref 0.5–1.5)
Methemoglobin: <0.7 % (ref 0.0–1.5)
O2 Saturation: 59.8 %
Total hemoglobin: 9.2 g/dL — ABNORMAL LOW (ref 12.0–16.0)

## 2022-03-20 NOTE — Progress Notes (Signed)
Mobility Specialist Progress Note:   03/20/22 0955  Mobility  Activity Ambulated with assistance in hallway  Level of Assistance Standby assist, set-up cues, supervision of patient - no hands on  Assistive Device Four wheel walker  Distance Ambulated (ft) 1000 ft  Activity Response Tolerated well  Mobility Referral Yes  $Mobility charge 1 Mobility   During mobility: HR 94bpm; SpO2 97% RA   Pt eager for mobility session. Required no physical assistance throughout. No c/o during ambulation. Pt left sitting EOB with all needs met.   Nelta Numbers Mobility Specialist Please contact via SecureChat or  Rehab office at [phone]

## 2022-03-20 NOTE — Progress Notes (Signed)
PT Cancellation Note  Patient Details Name: Jordan Oconnell MRN: [id number] DOB: August 02, 1940   Cancelled Treatment:    Reason Eval/Treat Not Completed: (P) Other (comment) (pt defer due to need for bathing/use of bed pan, pt agreeable to work with PTA later in the day.) Will continue efforts per PT plan of care as schedule permits.   Kara Pacer Cordie Beazley 03/20/2022, 12:49 PM

## 2022-03-20 NOTE — Progress Notes (Signed)
Patient ambulated with this RN 1000 ft in the hallway using rollator on room air tolerated very well. Pt back to bed with call bell within reach.Will continue to monitor.

## 2022-03-20 NOTE — Progress Notes (Signed)
Progress Note   Patient: Jordan Oconnell [id number] DOB: [birth year] DOA: 03/11/2022     9 DOS: the patient was seen and examined on 03/20/2022   Brief hospital course: Mr. Strouse was admitted to the hospital with the working diagnosis of heart failure exacerbation.   82 yo male with the past medical history of atrial fibrillation, heart failure, hypertension, CKD and urothelial carcinoma right kidney who presented with dyspnea and fatigue. Patient reported 7 days of worsening dyspnea, intermittent pre syncope episodes and fatigue. He was evaluated as outpatient and found in uncontrolled atrial fibrillation and heart failure and was referred to the Ed. On his initial physical examination his blood pressure was 104/82, HR 119, RR 20 and 02 saturation 99% on room air. Lungs clear to auscultation, heart with S1 and S2 present, irregularly irregular with no gallops, abdomen with no distention and no lower extremity edema.   Na 138, K 4,8 CL 108 bicarbonate 17 glucose 93 bun 68 cr 3,0 BNP 3,602 High sensitive troponin 152, 133  Wbc 7,0 hgb 10,0 plt 109  TSH 4,6  Chest radiograph with mild cardiomegaly, bilateral hilar vascular congestion, with bilateral atelectasis at bases, has pacemaker in place with one atrial and one ventricular lead.   EKG 116 bpm, right axis deviation, qtc 565, left bundle brunch block, sinus rhythm with no significant ST segment changes, negative T wave lead II, III, AvF.   Patient was placed on IV amiodarone for rate control.  02/16 direct current cardioversion.  02/18 atrial fibrillation with RVR, transition back to IV amiodarone.  02/22 patient has remained on sinus rhythm. Plan to continue with oral amiodarone and oral carvedilol.  02/23 patient had a brief period of RVR atrial fibrillation, that converted to ventricular pacing with improvement in heart rate.  02/24 continue to have intermittent atrial fibrillation with RVR, now back on amiodarone drip.    Assessment and Plan: * Atrial fibrillation with rapid ventricular response (HCC) Sp cardioversion. This morning had a brief period of atrial fibrillation with RVR.  Telemetry with combination of atrial -ventricular pacing, a sensing and ventricular pacing and sinus rhythm.   Continue amiodarone drip for rhythm control. Anticoagulation with apixaban.   Acute on chronic systolic CHF (congestive heart failure) (HCC) Echocardiogram with reduced LV systolic function 20 to [id number], mild LVH, mid and distal inferior wall akinetic. Entire septum, basal inferior segment and apex are hypokinetic. RV with normal systolic function, LA with severe dilatation. SP TAVR.   Urine output is 123XX123 ml Systolic blood pressure [id number] to 130 mmHg. SV02 59,8  Carvedilol increased to 6,25 mg bid.  Continue to hold on diuretic therapy.   Acute renal failure superimposed on stage 4 chronic kidney disease ([place]) Renal function today with serum cr at 2,44 with K at 4,3 and serum bicarbonate at 20. Na 138 and mg 2,1   Continue to hold on diuretic therapy and follow up renal function in am. Avoid hypotension and nephrotoxic medications.   Anemia of chronic renal disease with stable hgb at 9,6   Essential hypertension Continue blood pressure control with carvedilol.   Bladder cancer ([place]) Follow up as outpatient        Subjective: Patient with no chest pain, no dyspnea or palpitations.   Physical Exam: Vitals:   03/20/22 0245 03/20/22 0300 03/20/22 0400 03/20/22 0916  BP: 116/74   133/80  Pulse: 70  70 72  Resp: 20 20 (!) 21 20  Temp: 97.7 F (36.5 C)   97.6 F (  36.4 C)  TempSrc: Oral   Oral  SpO2: 90%  96% 98%  Weight:   79 kg   Height:       Neurology awake and alert ENT with mild pallor Cardiovascular with S1 and S2 present and regular with no gallops or rubs, mild systolic murmur at the apex No JVD No lower extremity edema Respiratory with no rales or wheezing, no rhonchi Abdomen with no  distention  Data Reviewed:    Family Communication: no family at the bedside   Disposition: Status is: Inpatient Remains inpatient appropriate because: atrial fibrillation with RVR   Planned Discharge Destination: Home    Author: Tawni Millers, MD 03/20/2022 12:09 PM  For on call review [email or web address].

## 2022-03-20 NOTE — Progress Notes (Signed)
Patient ID: Jordan Oconnell, male   DOB: [birth year], 82 y.o.   MRN: [id number]     Advanced Heart Failure Rounding Note  PCP-Cardiologist: Sanda Klein, MD   Subjective:    He went back into NSR this morning.  Remains on amiodarone gtt.   Co-ox 60%. CVP 5. Creatinine lower 2.56 => 2.44.   No dyspnea, walked last night.    Objective:   Weight Range: 79 kg Body mass index is 22.98 kg/m.   Vital Signs:   Temp:  [97.4 F (36.3 C)-98 F (36.7 C)] 97.6 F (36.4 C) (02/24 0916) Pulse Rate:  [70-91] 72 (02/24 0916) Resp:  [18-21] 20 (02/24 0916) BP: (110-134)/(66-80) 133/80 (02/24 0916) SpO2:  [90 %-99 %] 98 % (02/24 0916) Weight:  [79 kg] 79 kg (02/24 0400) Last BM Date : 03/17/22  Weight change: Filed Weights   03/18/22 0452 03/19/22 0513 03/20/22 0400  Weight: 82.5 kg 81.3 kg 79 kg    Intake/Output:   Intake/Output Summary (Last 24 hours) at 03/20/2022 1059 Last data filed at 03/20/2022 0700 Gross per 24 hour  Intake 790 ml  Output 1225 ml  Net -435 ml      Physical Exam   General: NAD Neck: No JVD, no thyromegaly or thyroid nodule.  Lungs: Clear to auscultation bilaterally with normal respiratory effort. CV: Nondisplaced PMI.  Heart regular S1/S2, no S3/S4, 2/6 SEM RUSB.  No peripheral edema.   Abdomen: Soft, nontender, no hepatosplenomegaly, no distention.  Skin: Intact without lesions or rashes.  Neurologic: Alert and oriented x 3.  Psych: Normal affect. Extremities: No clubbing or cyanosis.  HEENT: Normal.   Telemetry   Converted to NSR with LB pacing this morning (personally reviewed)  EKG    N/A   Labs    CBC No results for input(s): "WBC", "NEUTROABS", "HGB", "HCT", "MCV", "PLT" in the last 72 hours.  Basic Metabolic Panel Recent Labs    03/19/22 0500 03/20/22 0520  NA 140 138  K 3.8 4.3  CL 109 109  CO2 23 20*  GLUCOSE 97 113*  BUN 38* 38*  CREATININE 2.56* 2.44*  CALCIUM 8.8* 8.7*  MG 2.1 2.1    BNP: BNP (last 3  results) Recent Labs    03/11/22 1811 03/13/22 0820  BNP 3,602.4* 1,669.7*     Medications:     Scheduled Medications:  ALPRAZolam  0.25 mg Oral QHS   apixaban  2.5 mg Oral BID   carvedilol  6.25 mg Oral BID WC   Chlorhexidine Gluconate Cloth  6 each Topical Daily   vitamin B-12  1,000 mcg Oral Daily   ferrous sulfate  325 mg Oral Q breakfast   latanoprost  1 drop Both Eyes QHS   levothyroxine  50 mcg Oral QHS   sodium chloride flush  10-40 mL Intracatheter Q12H   Infusions:  amiodarone 30 mg/hr (03/20/22 0700)     PRN Medications: alum & mag hydroxide-simeth, levalbuterol, loperamide, ondansetron **OR** ondansetron (ZOFRAN) IV, mouth rinse, sodium chloride flush    Patient Profile   Mr Jordan Oconnell is an 82 yo M with 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   Assessment/Plan   1. Atrial fibrillation/flutter: Persistent, admitted with RVR.  30% AF/AFL by device interrogation over the last few months. Now on amiodarone gtt and maintaining NSR after DCCV 2/6 and again  2/16.  Possible that fall in EF is due to tachy-mediated CMP with loss of left bundle pacing. He has been in and out of atrial fibrillation while amiodarone loading, went back into NSR this morning.  - Continue IV amiodarone today, convert to po tomorrow if he stays in NSR.  - Continue apixaban, dosed for renal function/age at 2.5 bid.  2. AKI on CKD stage IV: Baseline creatinine 2.5, up to 3.1 this admission.  Stable at 2.44 today.  Possible cardiorenal component.  He is not volume overloaded, CVP 5.  - Hold diuretics today.  3. Acute on chronic systolic CHF: Probably nonischemic cardiomyopathy.  Cath prior to TAVR in 9/22 showed 90% stenosis in D1 and 60% ramus stenosis, not extensive CAD. Echo in  1/23 post-TAVR with EF 55%.  Echo in 1/24 with EF down to 20-25%, mildly decreased RV systolic function, and normal bioprosthetic aortic valve.  Atrial fibrillation burden is now greater, fall in EF may be due to tachycardia-mediated CMP (was in RVR at admission) combined with loss of left bundle pacing when in RVR.  He was diuresed some this admission then diuretics stopped with rising creatinine and concern for low output.  CVP 5 today with co-ox 60%.  Creatinine stable 2.44.  .  - He is left bundle pacing when not in RVR, will need to keep in NSR. Amio per above  - Continue Coreg 6.25 mg bid.  - GDMT will be limited by elevated creatinine.   4. Bradycardia: S/p [employer] PPM with LB lead placed post-TAVR.  5. Severe aortic stenosis: S/p TAVR in 1/23.  Bioprosthetic aortic valve stable on 1/24 echo.   6. Fe deficiency anemia: Has had Feraheme.    Continue to mobilize.  Hopefully we can get him home soon.  Would like to switch to po amiodarone tomorrow if he stays in NSR.   Length of Stay: 9  Loralie Champagne, MD  03/20/2022, 10:59 AM  Advanced Heart Failure Team Pager [phone] (M-F; 7a - 5p)  Please contact [facility] for night-coverage after hours (5p -7a ) and weekends on [email or web address]

## 2022-03-20 NOTE — Progress Notes (Signed)
Physical Therapy Treatment Patient Details Name: Jordan Oconnell MRN: [id number] DOB: Jan 25, 1941 Today's Date: 03/20/2022   History of Present Illness 82 y.o. male admitted with heart failure and atrial flutter. PMH: Chronic systolic and diastolic heart failure, aortic stenosis status post TAVR, sick sinus syndrome status post dual-chamber pacemaker, urothelial cancer, CKD 4, and paroxysmal atrial fibrillation/flutter.    PT Comments    Pt received in supine, agreeable to therapy session and with good participation and tolerance for gait and transfer training. Pt with poor eccentric control to sit, needs safety cues mainly with transfers and would benefit from initial consistent initial supervision/assist at home for safety. Pt slightly more stable today using rollator, needing fewer safety cues compared with previous session, needing Supervision mostly and intermittent min guard with crowded hallway due to multiple lines and pt decreased environmental awareness with equipment. Pt continues to benefit from PT services to progress toward functional mobility goals.    Recommendations for follow up therapy are one component of a multi-disciplinary discharge planning process, led by the attending physician.  Recommendations may be updated based on patient status, additional functional criteria and insurance authorization.  Follow Up Recommendations  Home health PT     Assistance Recommended at Discharge Intermittent Supervision/Assistance  Patient can return home with the following Assistance with cooking/housework;Help with stairs or ramp for entrance   Equipment Recommendations  Other (comment);Rollator (4 wheels) (per pt request)    Recommendations for Other Services       Precautions / Restrictions Precautions Precautions: Fall Precaution Comments: R foot 3/4th toe edema/redness, bowel incontinence (briefs in room) Restrictions Weight Bearing Restrictions: No     Mobility  Bed  Mobility Overal bed mobility: Modified Independent Bed Mobility: Supine to Sit, Sit to Supine     Supine to sit: Modified independent (Device/Increase time) Sit to supine: Modified independent (Device/Increase time)   General bed mobility comments: good awareness of lines    Transfers Overall transfer level: Needs assistance Equipment used: Rollator (4 wheels) Transfers: Sit to/from Stand Sit to Stand: Min guard           General transfer comment: decreased eccentric control to sit, cues for safer UE placement; no lift assist needed.    Ambulation/Gait Ambulation/Gait assistance: Min guard, Supervision Gait Distance (Feet): 800 Feet Assistive device: Rollator (4 wheels) Gait Pattern/deviations: Step-through pattern Gait velocity: grossly <0.6 m/s     General Gait Details: SpO2 WFL when he rests his fingers (noisy signal while pt holding on to 4WW. Pt with forward head/downward gaze posture, able to improve this briefly, with frequent cues to maintain. Min guard intermittently due to crowded hallway/multiple obstacles and need for min cues due to IV/O2/tele/CVP lines and pt mild impulsivity.   Stairs Stairs:  (pt defers to attempt)           Wheelchair Mobility    Modified Rankin (Stroke Patients Only)       Balance Overall balance assessment: Needs assistance Sitting-balance support: No upper extremity supported Sitting balance-Leahy Scale: Good     Standing balance support: Bilateral upper extremity supported Standing balance-Leahy Scale: Fair                              Cognition Arousal/Alertness: Awake/alert Behavior During Therapy: WFL for tasks assessed/performed Overall Cognitive Status: Within Functional Limits for tasks assessed  General Comments: HoH; cooperative this session, good awareness of lines.        Exercises      General Comments General comments (skin integrity,  edema, etc.): SpO2 WFL on RA when hand not gripping RW, poor signal during gait; HR WFL 80's bpm throughout      Pertinent Vitals/Pain Pain Assessment Pain Assessment: No/denies pain Pain Intervention(s): Monitored during session, Repositioned     PT Goals (current goals can now be found in the care plan section) Acute Rehab PT Goals Patient Stated Goal: to return to independent and walking for exercise PT Goal Formulation: With patient Time For Goal Achievement: 03/26/22 Progress towards PT goals: Progressing toward goals    Frequency    Min 3X/week      PT Plan Current plan remains appropriate       AM-PAC PT "6 Clicks" Mobility   Outcome Measure  Help needed turning from your back to your side while in a flat bed without using bedrails?: None Help needed moving from lying on your back to sitting on the side of a flat bed without using bedrails?: None Help needed moving to and from a bed to a chair (including a wheelchair)?: A Little Help needed standing up from a chair using your arms (e.g., wheelchair or bedside chair)?: A Little Help needed to walk in hospital room?: A Little Help needed climbing 3-5 steps with a railing? : A Lot 6 Click Score: 19    End of Session Equipment Utilized During Treatment: Gait belt Activity Tolerance: Patient tolerated treatment well Patient left: in bed;with call bell/phone within reach;with bed alarm set;with nursing/sitter in room;Other (comment) Scientist, forensic in his room. Encouraged pt to sit up in recliner chair but he defers.) Nurse Communication: Mobility status;Other (comment) (pt requesting to walk again later in the day) PT Visit Diagnosis: Other abnormalities of gait and mobility (R26.89)     Time: [id number] PT Time Calculation (min) (ACUTE ONLY): 25 min  Charges:  $Gait Training: 23-37 mins                     Envi Eagleson P., PTA Acute Rehabilitation Services Secure Chat Preferred 9a-5:30pm Office: [place] 03/20/2022, 3:33 PM

## 2022-03-21 DIAGNOSIS — C671 Malignant neoplasm of dome of bladder: Secondary | ICD-10-CM | POA: Diagnosis not present

## 2022-03-21 DIAGNOSIS — I5023 Acute on chronic systolic (congestive) heart failure: Secondary | ICD-10-CM | POA: Diagnosis not present

## 2022-03-21 DIAGNOSIS — I4891 Unspecified atrial fibrillation: Secondary | ICD-10-CM | POA: Diagnosis not present

## 2022-03-21 DIAGNOSIS — N179 Acute kidney failure, unspecified: Secondary | ICD-10-CM | POA: Diagnosis not present

## 2022-03-21 LAB — BASIC METABOLIC PANEL
Anion gap: 11 (ref 5–15)
BUN: 39 mg/dL — ABNORMAL HIGH (ref 8–23)
CO2: 18 mmol/L — ABNORMAL LOW (ref 22–32)
Calcium: 8.8 mg/dL — ABNORMAL LOW (ref 8.9–10.3)
Chloride: 110 mmol/L (ref 98–111)
Creatinine, Ser: 2.51 mg/dL — ABNORMAL HIGH (ref 0.61–1.24)
GFR, Estimated: 25 mL/min — ABNORMAL LOW (ref >60)
Glucose, Bld: 116 mg/dL — ABNORMAL HIGH (ref 70–99)
Potassium: 4.4 mmol/L (ref 3.5–5.1)
Sodium: 139 mmol/L (ref 135–145)

## 2022-03-21 LAB — MAGNESIUM: Magnesium: 2 mg/dL (ref 1.7–2.4)

## 2022-03-21 MED ORDER — AMIODARONE HCL 200 MG PO TABS
400.0000 mg | ORAL_TABLET | Freq: Two times a day (BID) | ORAL | Status: DC
  Administered 2022-03-21 – 2022-03-22 (×3): 400 mg via ORAL
  Filled 2022-03-21 (×3): qty 2

## 2022-03-21 NOTE — Progress Notes (Signed)
Progress Note   Patient: Jordan Oconnell [id number] DOB: [birth year] DOA: 03/11/2022     10 DOS: the patient was seen and examined on 03/21/2022   Brief hospital course: Mr. Lynskey was admitted to the hospital with the working diagnosis of heart failure exacerbation.   82 yo male with the past medical history of atrial fibrillation, heart failure, hypertension, CKD and urothelial carcinoma right kidney who presented with dyspnea and fatigue. Patient reported 7 days of worsening dyspnea, intermittent pre syncope episodes and fatigue. He was evaluated as outpatient and found in uncontrolled atrial fibrillation and heart failure and was referred to the Ed. On his initial physical examination his blood pressure was 104/82, HR 119, RR 20 and 02 saturation 99% on room air. Lungs clear to auscultation, heart with S1 and S2 present, irregularly irregular with no gallops, abdomen with no distention and no lower extremity edema.   Na 138, K 4,8 CL 108 bicarbonate 17 glucose 93 bun 68 cr 3,0 BNP 3,602 High sensitive troponin 152, 133  Wbc 7,0 hgb 10,0 plt 109  TSH 4,6  Chest radiograph with mild cardiomegaly, bilateral hilar vascular congestion, with bilateral atelectasis at bases, has pacemaker in place with one atrial and one ventricular lead.   EKG 116 bpm, right axis deviation, qtc 565, left bundle brunch block, sinus rhythm with no significant ST segment changes, negative T wave lead II, III, AvF.   Patient was placed on IV amiodarone for rate control.  02/16 direct current cardioversion.  02/18 atrial fibrillation with RVR, transition back to IV amiodarone.  02/22 patient has remained on sinus rhythm. Plan to continue with oral amiodarone and oral carvedilol.  02/23 patient had a brief period of RVR atrial fibrillation, that converted to ventricular pacing with improvement in heart rate.  02/24 continue to have intermittent atrial fibrillation with RVR, now back on amiodarone drip.  02/25  patient with no further atrial fibrillation, transitioned back to oral amiodarone.   Assessment and Plan: * Atrial fibrillation with rapid ventricular response (HCC) Sp cardioversion. No further atrial fibrillation, telemetry personally reviewed.  Atrial paced and ventricular paced with intermittent sinus rhythm, rate in the 60 to 70 bpm.   Transitioned to oral amiodarone 400 mg po bid.  Anticoagulation with apixaban.   Acute on chronic systolic CHF (congestive heart failure) (HCC) Echocardiogram with reduced LV systolic function 20 to 123456, mild LVH, mid and distal inferior wall akinetic. Entire septum, basal inferior segment and apex are hypokinetic. RV with normal systolic function, LA with severe dilatation. SP TAVR.   Urine output is 0000000  ml Systolic blood pressure 123456 to 130 mmHg. SV02 59,8  Carvedilol 6,25 mg bid.  Resume diuresis at the time of discharge.   Acute renal failure superimposed on stage 4 chronic kidney disease ([place]) Clinically with euvolemic state, renal function with serum cr at 2,51 with K at 4,4 and serum bicarbonate at 18.   Possible resume diuresis at the time of discharge.   Anemia of chronic renal disease with stable hgb at 9,6   Essential hypertension Continue blood pressure control with carvedilol.   Bladder cancer ([place]) Sp urostomy.  Follow up as outpatient        Subjective: patient is feeling well no chest pain or dyspnea , no palpitations   Physical Exam: Vitals:   03/21/22 0329 03/21/22 0410 03/21/22 0850 03/21/22 0933  BP: 120/75  (!) 144/77 130/75  Pulse: 72 70  72  Resp: '20 16  20  '$ Temp: 98  F (36.7 C)  97.8 F (36.6 C) (!) 97.3 F (36.3 C)  TempSrc: Oral  Axillary   SpO2: 96% 100%  97%  Weight:      Height:       Neurology awake and alert ENT with mild pallor Cardiovascular with S1 and S2 present and regular with no gallops, or rubs, positive systolic murmur at the apex Respiratory with no rales or wheezing, with no  rhonchi Abdomen with no distention  No lower extremity edema  Data Reviewed:    Family Communication: no family at the bedside   Disposition: Status is: Inpatient Remains inpatient appropriate because: telemetry monitoring possible discharge tomorrow morning   Planned Discharge Destination: Home    Author: Tawni Millers, MD 03/21/2022 2:49 PM  For on call review [email or web address].

## 2022-03-21 NOTE — Progress Notes (Signed)
Patient ID: Jordan Oconnell, male   DOB: 1940/07/17, 82 y.o.   MRN: [id number]     Advanced Heart Failure Rounding Note  PCP-Cardiologist: Sanda Klein, MD   Subjective:    Patient has maintained NSR on amiodarone gtt.   CVP < 5. Creatinine stable 2.56 => 2.44 => 2.44.   No dyspnea, has been walking.    Objective:   Weight Range: 79 kg Body mass index is 22.98 kg/m.   Vital Signs:   Temp:  [97.3 F (36.3 C)-98.4 F (36.9 C)] 97.3 F (36.3 C) (02/25 0933) Pulse Rate:  [70-72] 72 (02/25 0933) Resp:  [16-20] 20 (02/25 0933) BP: (120-144)/(67-77) 130/75 (02/25 0933) SpO2:  [95 %-100 %] 97 % (02/25 0933) Last BM Date : 03/17/22  Weight change: Filed Weights   03/18/22 0452 03/19/22 0513 03/20/22 0400  Weight: 82.5 kg 81.3 kg 79 kg    Intake/Output:   Intake/Output Summary (Last 24 hours) at 03/21/2022 1021 Last data filed at 03/21/2022 0800 Gross per 24 hour  Intake 600 ml  Output 1650 ml  Net -1050 ml      Physical Exam   General: NAD Neck: No JVD, no thyromegaly or thyroid nodule.  Lungs: Clear to auscultation bilaterally with normal respiratory effort. CV: Nondisplaced PMI.  Heart regular S1/S2, no S3/S4, no murmur.  No peripheral edema.   Abdomen: Soft, nontender, no hepatosplenomegaly, no distention.  Skin: Intact without lesions or rashes.  Neurologic: Alert and oriented x 3.  Psych: Normal affect. Extremities: No clubbing or cyanosis.  HEENT: Normal.     Telemetry   NSR with LB pacing (personally reviewed)  EKG    N/A   Labs    CBC No results for input(s): "WBC", "NEUTROABS", "HGB", "HCT", "MCV", "PLT" in the last 72 hours.  Basic Metabolic Panel Recent Labs    03/20/22 0520 03/21/22 0635  NA 138 139  K 4.3 4.4  CL 109 110  CO2 20* 18*  GLUCOSE 113* 116*  BUN 38* 39*  CREATININE 2.44* 2.51*  CALCIUM 8.7* 8.8*  MG 2.1 2.0    BNP: BNP (last 3 results) Recent Labs    03/11/22 1811 03/13/22 0820  BNP 3,602.4* 1,669.7*      Medications:     Scheduled Medications:  ALPRAZolam  0.25 mg Oral QHS   amiodarone  400 mg Oral BID   apixaban  2.5 mg Oral BID   carvedilol  6.25 mg Oral BID WC   Chlorhexidine Gluconate Cloth  6 each Topical Daily   vitamin B-12  1,000 mcg Oral Daily   ferrous sulfate  325 mg Oral Q breakfast   latanoprost  1 drop Both Eyes QHS   levothyroxine  50 mcg Oral QHS   sodium chloride flush  10-40 mL Intracatheter Q12H   Infusions:     PRN Medications: alum & mag hydroxide-simeth, levalbuterol, loperamide, ondansetron **OR** ondansetron (ZOFRAN) IV, mouth rinse, sodium chloride flush    Patient Profile   Jordan Oconnell is an 82 yo M with 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   Assessment/Plan   1. Atrial fibrillation/flutter: Persistent, admitted with RVR.  30% AF/AFL by device interrogation over the last few months. Now on amiodarone gtt and maintaining NSR after DCCV 2/6 and again 2/16.  Possible that fall in EF is due to tachy-mediated CMP with  loss of left bundle pacing. He has been in and out of atrial fibrillation while amiodarone loading, went back into NSR 2/24 early am.  - Convert to amiodarone 400 mg bid.  - Continue apixaban, dosed for renal function/age at 2.5 bid.  2. AKI on CKD stage IV: Baseline creatinine 2.5, up to 3.1 this admission.  Stable at 2.5 today.  Possible cardiorenal component.  He is not volume overloaded, CVP < 5.  - Hold diuretics today.  3. Acute on chronic systolic CHF: Probably nonischemic cardiomyopathy.  Cath prior to TAVR in 9/22 showed 90% stenosis in D1 and 60% ramus stenosis, not extensive CAD. Echo in 1/23 post-TAVR with EF 55%.  Echo in 1/24 with EF down to 20-25%, mildly decreased RV systolic function, and normal  bioprosthetic aortic valve.  Atrial fibrillation burden is now greater, fall in EF may be due to tachycardia-mediated CMP (was in RVR at admission) combined with loss of left bundle pacing when in RVR.  He was diuresed some this admission then diuretics stopped with rising creatinine and concern for low output.  CVP < 5 today with co-ox 60% yesterday (not done today).  Creatinine stable 2.5.    - He is left bundle pacing when not in RVR, will need to keep in NSR. Amiodarone per above  - Continue Coreg 6.25 mg bid.  - GDMT will be limited by elevated creatinine.   4. Bradycardia: S/p [employer] PPM with LB lead placed post-TAVR.  5. Severe aortic stenosis: S/p TAVR in 1/23.  Bioprosthetic aortic valve stable on 1/24 echo.   6. Fe deficiency anemia: Has had Feraheme.    Continue to mobilize.  If he stays in NSR, would try to get him home tomorrow.   Length of Stay: [place], MD  03/21/2022, 10:21 AM  Advanced Heart Failure Team Pager [phone] (M-F; 7a - 5p)  Please contact [facility] for night-coverage after hours (5p -7a ) and weekends on [email or web address]

## 2022-03-22 ENCOUNTER — Other Ambulatory Visit (HOSPITAL_COMMUNITY): Payer: Self-pay

## 2022-03-22 DIAGNOSIS — I5023 Acute on chronic systolic (congestive) heart failure: Secondary | ICD-10-CM | POA: Diagnosis not present

## 2022-03-22 DIAGNOSIS — I4891 Unspecified atrial fibrillation: Secondary | ICD-10-CM | POA: Diagnosis not present

## 2022-03-22 DIAGNOSIS — C671 Malignant neoplasm of dome of bladder: Secondary | ICD-10-CM | POA: Diagnosis not present

## 2022-03-22 DIAGNOSIS — N179 Acute kidney failure, unspecified: Secondary | ICD-10-CM | POA: Diagnosis not present

## 2022-03-22 LAB — COOXEMETRY PANEL
Carboxyhemoglobin: 2.1 % — ABNORMAL HIGH (ref 0.5–1.5)
Methemoglobin: <0.7 % (ref 0.0–1.5)
O2 Saturation: 56.1 %
Total hemoglobin: 9.6 g/dL — ABNORMAL LOW (ref 12.0–16.0)

## 2022-03-22 LAB — RENAL FUNCTION PANEL
Albumin: 2.7 g/dL — ABNORMAL LOW (ref 3.5–5.0)
Anion gap: 10 (ref 5–15)
BUN: 41 mg/dL — ABNORMAL HIGH (ref 8–23)
CO2: 19 mmol/L — ABNORMAL LOW (ref 22–32)
Calcium: 8.8 mg/dL — ABNORMAL LOW (ref 8.9–10.3)
Chloride: 110 mmol/L (ref 98–111)
Creatinine, Ser: 2.62 mg/dL — ABNORMAL HIGH (ref 0.61–1.24)
GFR, Estimated: 24 mL/min — ABNORMAL LOW (ref >60)
Glucose, Bld: 90 mg/dL (ref 70–99)
Phosphorus: 3.1 mg/dL (ref 2.5–4.6)
Potassium: 4.3 mmol/L (ref 3.5–5.1)
Sodium: 139 mmol/L (ref 135–145)

## 2022-03-22 LAB — MAGNESIUM: Magnesium: 1.9 mg/dL (ref 1.7–2.4)

## 2022-03-22 MED ORDER — TORSEMIDE 20 MG PO TABS
20.0000 mg | ORAL_TABLET | Freq: Every day | ORAL | 0 refills | Status: DC | PRN
  Filled 2022-03-22: qty 30, 30d supply, fill #0

## 2022-03-22 MED ORDER — CARVEDILOL 6.25 MG PO TABS
6.2500 mg | ORAL_TABLET | Freq: Two times a day (BID) | ORAL | 0 refills | Status: DC
  Filled 2022-03-22: qty 60, 30d supply, fill #0

## 2022-03-22 MED ORDER — AMIODARONE HCL 200 MG PO TABS: 200.0000 mg | ORAL_TABLET | Freq: Two times a day (BID) | ORAL | Status: DC

## 2022-03-22 MED ORDER — TORSEMIDE 20 MG PO TABS: 20.0000 mg | ORAL_TABLET | Freq: Every day | ORAL | Status: DC | PRN

## 2022-03-22 MED ORDER — AMIODARONE HCL 200 MG PO TABS
ORAL_TABLET | ORAL | 0 refills | Status: DC
  Filled 2022-03-22: qty 90, 41d supply, fill #0

## 2022-03-22 MED ORDER — AMIODARONE HCL 200 MG PO TABS: 200.0000 mg | ORAL_TABLET | Freq: Every day | ORAL | Status: DC

## 2022-03-22 NOTE — Discharge Summary (Addendum)
Physician Discharge Summary   Patient: Jordan Oconnell MRN: [id number] DOB: [birth year]  Admit date:     03/11/2022  Discharge date: 03/22/22  Discharge Physician: Jordan Oconnell   PCP: Jordan Bunting, MD   Recommendations at discharge:    Patient will continue amiodarone load with 400 mg po bid for 7 days, then 200 mg po bid for 4 weeks and then continue with 200 mg daily. Changed metoprolol to carvedilol. Added torsemide 20 mg po daily to take as needed for volume overload, weight increase 2 to 3 lbs in 24 hrs or 5 lbs in 7 days. Follow up renal function in 7 days Follow up with Dr Jordan Oconnell in 7 to 10 days. Follow up with Cardiology as scheduled.   Discharge Diagnoses: Principal Problem:   Atrial fibrillation with rapid ventricular response (HCC) Active Problems:   Acute on chronic systolic CHF (congestive heart failure) (HCC)   Acute renal failure superimposed on stage 4 chronic kidney disease (HCC)   Essential hypertension   Bladder cancer ([place])  Resolved Problems:   * No resolved hospital problems. [facility] Course: [name] was admitted to the hospital with the working diagnosis of heart failure exacerbation.   82 yo male with the past medical history of atrial fibrillation, heart failure, hypertension, CKD and urothelial carcinoma right kidney who presented with dyspnea and fatigue. Patient reported 7 days of worsening dyspnea, intermittent pre syncope episodes and fatigue. He was evaluated as outpatient and found in uncontrolled atrial fibrillation and heart failure and was referred to the Ed. On his initial physical examination his blood pressure was 104/82, HR 119, RR 20 and 02 saturation 99% on room air. Lungs clear to auscultation, heart with S1 and S2 present, irregularly irregular with no gallops, abdomen with no distention and no lower extremity edema.   Na 138, K 4,8 CL 108 bicarbonate 17 glucose 93 bun 68 cr 3,0 BNP 3,602 High sensitive troponin 152,  133  Wbc 7,0 hgb 10,0 plt 109  TSH 4,6  Chest radiograph with mild cardiomegaly, bilateral hilar vascular congestion, with bilateral atelectasis at bases, has pacemaker in place with one atrial and one ventricular lead.   EKG 116 bpm, right axis deviation, qtc 565, left bundle brunch block, sinus rhythm with no significant ST segment changes, negative T wave lead II, III, AvF.   Patient was placed on IV amiodarone for rate control.  02/16 direct current cardioversion.  02/18 atrial fibrillation with RVR, transition back to IV amiodarone.  02/22 patient has remained on sinus rhythm. Plan to continue with oral amiodarone and oral carvedilol.  02/23 patient had a brief period of RVR atrial fibrillation, that converted to ventricular pacing with improvement in heart rate.  02/24 continue to have intermittent atrial fibrillation with RVR, now back on amiodarone drip.  02/25 patient with no further atrial fibrillation, transitioned back to oral amiodarone.  02/26 Improved heart rate controlled, plan to continue oral amiodarone and follow up as outpatient.   Assessment and Plan: * Atrial fibrillation with rapid ventricular response (HCC) Sp cardioversion. Patient required IV amiodarone for rate control, after several days of lV loading dose his rate has been better controlled with no further episodes of atrial fibrillation.  Telemetry has been atrial -ventricular pacing, atrial sensing and ventricular pacing and sinus rhythm.   Plan to continue oral amiodarone as outpatient.   Anticoagulation with apixaban.   Acute on chronic systolic CHF (congestive heart failure) (HCC) Echocardiogram with reduced LV systolic function 20  to 25%, mild LVH, mid and distal inferior wall akinetic. Entire septum, basal inferior segment and apex are hypokinetic. RV with normal systolic function, LA with severe dilatation. SP TAVR.   Patient was placed on IV furosemide for diuresis, negative fluid balance was  achieved, -4,135 ml, with significant improvement in his symptoms.   Patient will continue heart failure management with carvedilol, changed from metoprolol with good toleration. Resume loop diuretic at the time of discharge.  Limited pharmacological options due to reduced GFR. (No RAS inhibition).   Acute renal failure superimposed on stage 4 chronic kidney disease (HCC) His volume status has improved, at the time of his discharge his renal function has a serum cr of 2,62 with K at 4.3 and serum bicarbonate at 19.  Na 139 and MG 1,9   Plan to resume loop diuretic therapy at the time of his discharge.   Anemia of chronic renal disease with stable hgb at 9,6   Essential hypertension Continue blood pressure control with carvedilol.   Bladder cancer ([place]) Sp urostomy.  Follow up as outpatient         Consultants: cardiology  Procedures performed: direct current cardioversion   Disposition: Home Diet recommendation:  Cardiac diet DISCHARGE MEDICATION: Allergies as of 03/22/2022       Reactions   Amoxicillin Other (See Comments)   Acute interstitial nephritis   Doxycycline Hives   Flomax [tamsulosin Hcl] Other (See Comments)   Dizzy        Medication List     STOP taking these medications    metoprolol succinate 50 MG 24 hr tablet Commonly known as: TOPROL-XL       TAKE these medications    ALPRAZolam 0.25 MG tablet Commonly known as: XANAX Take 0.25 mg by mouth at bedtime.   amiodarone 200 MG tablet Commonly known as: PACERONE Take 2 tablets twice daily for 7 days (until 03/28/22), then starting on 03/29/22 continue with 1 tablet twice daily for 4 weeks until 04/25/22, then starting on 04/26/22 take one tablet daily. What changed:  medication strength how much to take how to take this when to take this additional instructions   azithromycin 500 MG tablet Commonly known as: Zithromax Take 1 tablet by mouth 1 hour before dental procedures and  cleanings   carvedilol 6.25 MG tablet Commonly known as: COREG Take 1 tablet (6.25 mg total) by mouth 2 (two) times daily with a meal.   Eliquis 2.5 MG Tabs tablet Generic drug: apixaban Take 2.5 mg by mouth 2 (two) times daily.   ferrous sulfate 325 (65 FE) MG EC tablet Take 1 tablet (325 mg total) by mouth in the morning and at bedtime.   KEYTRUDA IV Inject into the vein every 6 (six) weeks. Infusion   latanoprost 0.005 % ophthalmic solution Commonly known as: XALATAN Place 1 drop into both eyes at bedtime.   levothyroxine 50 MCG tablet Commonly known as: SYNTHROID Take 50 mcg by mouth at bedtime.   mirtazapine 15 MG tablet Commonly known as: REMERON Take 7.5 mg by mouth at bedtime.   sodium bicarbonate 650 MG tablet Take 1,300 mg by mouth 2 (two) times daily.   torsemide 20 MG tablet Commonly known as: DEMADEX Take 1 tablet (20 mg total) by mouth daily as needed (weight gain 2 to 3 lbs in 24 hrs or 5 lbs in 7 days.).               Durable Medical Equipment  (From admission, onward)  Start     Ordered   03/18/22 1128  For home use only DME 4 wheeled rolling walker with seat  Once       Question:  Patient needs a walker to treat with the following condition  Answer:  Heart failure ([place])   03/18/22 1128            Follow-up Information     [facility] Follow up.   Specialty: Home Health Services Why: Home Health-agency will call to arrange appt Contact information: [address] [place] [id number] [phone]                Discharge Exam: Filed Weights   03/19/22 0513 03/20/22 0400 03/22/22 0342  Weight: 81.3 kg 79 kg 80.7 kg   BP 114/73 (BP Location: Left Arm)   Pulse 72   Temp 97.6 F (36.4 C) (Oral)   Resp 15   Ht '6\' 1"'$  (1.854 m)   Wt 80.7 kg   SpO2 97%   BMI 23.47 kg/m   Patient is feeling well, no chest pain or dyspnea,  Neurology awake and alert ENT with mild pallor Cardiovascular  with S1 and S2 present and regular with no gallops, or rubs No JVD No lower extremity edema Respiratory with no rales or wheezing Abdomen with no distention   Condition at discharge: stable  The results of significant diagnostics from this hospitalization (including imaging, microbiology, ancillary and laboratory) are listed below for reference.   Imaging Studies: [place] EKG SITE RITE  Result Date: 03/15/2022 If Site Rite image not attached, placement could not be confirmed due to current cardiac rhythm.  DG Chest 2 View  Result Date: 03/14/2022 CLINICAL DATA:  Acute respiratory failure with hypoxia. EXAM: CHEST - 2 VIEW COMPARISON:  03/12/2022 FINDINGS: The heart is mildly enlarged but stable. Stable tortuosity and calcification of the thoracic aorta. The pacer wires are stable. TAVR again noted. Persistent CHF with interstitial pulmonary edema and bilateral pleural effusions. Slight improved perihilar aeration when compared to prior chest x-ray. IMPRESSION: Persistent CHF with interstitial pulmonary edema and bilateral pleural effusions. Slight improved aeration when compared to prior chest x-ray. Electronically Signed   By: Marijo Sanes M.D.   On: 03/14/2022 11:05   DG CHEST PORT 1 VIEW  Result Date: 03/12/2022 CLINICAL DATA:  Tachypnea EXAM: PORTABLE CHEST 1 VIEW COMPARISON:  03/11/2022 FINDINGS: Two frontal views of the chest demonstrate stable aortic valve prosthesis and dual lead pacer. Cardiac silhouette is enlarged. There is progressive central vascular congestion, with worsening bilateral perihilar airspace disease. Small bilateral pleural effusions. No pneumothorax. IMPRESSION: 1. Findings consistent with congestive heart failure and worsening volume status. Electronically Signed   By: Randa Ngo M.D.   On: 03/12/2022 18:21   CT CHEST ABDOMEN PELVIS WO CONTRAST  Result Date: 03/12/2022 CLINICAL DATA:  Metastatic disease evaluation. Right urothelial cancer and left renal cell  cancer. EXAM: CT CHEST, ABDOMEN AND PELVIS WITHOUT CONTRAST TECHNIQUE: Multidetector CT imaging of the chest, abdomen and pelvis was performed following the standard protocol without IV contrast. RADIATION DOSE REDUCTION: This exam was performed according to the departmental dose-optimization program which includes automated exposure control, adjustment of the mA and/or kV according to patient size and/or use of iterative reconstruction technique. COMPARISON:  11/20/2020 FINDINGS: Absence of IV contrast material limits evaluation of solid organs and vascular structures. CT CHEST FINDINGS Cardiovascular: Cardiac enlargement. Aortic valve prosthesis. No pericardial effusions. Cardiac pacemaker. Normal caliber thoracic aorta. Scattered aortic calcification.  Mediastinum/Nodes: Scattered mediastinal lymph nodes are present. Largest are in the subcarinal region measuring 1.2 cm short axis dimension. No change since prior study. Thyroid gland is unremarkable. Esophagus is decompressed. Lungs/Pleura: Small bilateral pleural effusions. Motion artifact limits evaluation of the lungs. There is atelectasis in the lung bases, likely compressive. Interlobular septal thickening in the lung apices may indicate fibrosis or edema. No definitive lung nodules. Musculoskeletal: Degenerative changes in the spine. Old healed fracture deformity of the right clavicle. Postoperative changes in the right shoulder. No destructive bone lesions. CT ABDOMEN PELVIS FINDINGS Hepatobiliary: Scattered subcentimeter low-attenuation lesions as seen on prior study, likely cysts. Cholelithiasis with several stones in the gallbladder. No inflammatory changes. No bile duct dilatation. Pancreas: Unremarkable. No pancreatic ductal dilatation or surrounding inflammatory changes. Spleen: Normal in size without focal abnormality. Adrenals/Urinary Tract: No adrenal gland nodules. Surgical absence of the left kidney. There is infiltration around the right kidney  and proximal right ureter with mild prominence of the extrarenal pelvis. This area corresponds to a mass lesion better seen with contrast-enhanced imaging on the prior study. Due to differences in technique, it is difficult to compare the 2 studies but the area of the lesion looks larger than on the prior study. Bladder is surgically absent. Right lower quadrant ileal conduit is decompressed. Stomach/Bowel: Stomach, small bowel, and colon are not abnormally distended. Postoperative changes suggesting partial colectomy with ileocolonic anastomosis. Scattered diverticula in the colon without any evidence of acute diverticulitis. Large ventral abdominal wall hernia containing multiple small and large bowel loops as well as omentum and mesentery. No proximal obstruction. Vascular/Lymphatic: Diffuse aortic calcification. No abdominal aortic aneurysm. Distal right iliac artery aneurysm measuring 2.4 cm. No significant lymphadenopathy. Reproductive: Prostate gland appears surgically absent. Other: No free air or free fluid in the abdomen. Musculoskeletal: Degenerative changes in the spine. No destructive bone lesions. IMPRESSION: 1. Lack of IV contrast material limits the examination. 2. Small bilateral pleural effusions with basilar atelectasis. No discrete pulmonary nodules are identified. 3. Mediastinal lymphadenopathy measuring up to 1.2 cm. No change since prior study. Nonspecific etiology. 4. Cholelithiasis without evidence of acute cholecystitis. 5. Enlargement of the area of the right renal pelvis with surrounding stranding. This corresponds to an enhancing tumor seen on the prior study although less well demonstrated today without IV contrast material. Correlation is limited due to lack of IV contrast material today but this area may be enlarged. There is surrounding stranding which was not present previously. 6. Surgical absence of the left kidney, bladder, and prostate gland. Right lower quadrant ileal conduit.  7. Large broad-based anterior abdominal wall hernia containing small and large bowel as well as mesentery and omentum. No proximal obstruction. 8. Aortic atherosclerosis. 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 Electronically Signed   By: Lucienne Capers M.D.   On: 03/12/2022 00:33   DG Chest Port 1 View  Result Date: 03/11/2022 CLINICAL DATA:  Shortness of breath. EXAM: PORTABLE CHEST 1 VIEW COMPARISON:  February 13, 2021 FINDINGS: Stable dual lead AICD positioning is seen. The cardiac silhouette is mildly enlarged and unchanged in size. An artificial aortic valve is noted. Low lung volumes are seen with mild areas of atelectasis within the bilateral lung bases, right greater than left. There is no evidence of a pleural effusion or pneumothorax. The visualized skeletal structures are unremarkable. IMPRESSION: Low lung volumes with mild bibasilar atelectasis, right greater than left. Electronically Signed   By: Virgina Norfolk M.D.   On: 03/11/2022 18:40   CUP  PACEART INCLINIC DEVICE CHECK  Result Date: 03/11/2022 Pacemaker check in clinic. Normal device function. Unable to perform atrial and ventricular threshold d/t AFl w RVR. Sensing and impedances consistent with previous measurements. Device programmed to maximize longevity. AT/AF 52% burden.  Device programmed at appropriate safety margins. Ventricular histogram elevated. See OV note for further discussion. Device programmed to optimize intrinsic conduction. Estimated longevity 7.5 years. Patient enrolled in remote follow-up. Patient education completed.  CUP PACEART REMOTE DEVICE CHECK  Result Date: 02/24/2022 Scheduled remote reviewed. Normal device function.  Persistent AFL, burden 72%, rates controlled ~70% of the time,  Metoprolol, Eliquis Next remote 91 days. [place]   Microbiology: Results for orders placed or performed during the hospital encounter of 03/11/22  Respiratory (~20 pathogens) panel by PCR     Status: None   Collection  Time: 03/12/22 12:32 AM   Specimen: Nasopharyngeal Swab; Respiratory  Result Value Ref Range Status   Adenovirus NOT DETECTED NOT DETECTED Final   Coronavirus 229E NOT DETECTED NOT DETECTED Final    Comment: (NOTE) The Coronavirus on the Respiratory Panel, DOES NOT test for the novel  Coronavirus (2019 nCoV)    Coronavirus HKU1 NOT DETECTED NOT DETECTED Final   Coronavirus NL63 NOT DETECTED NOT DETECTED Final   Coronavirus OC43 NOT DETECTED NOT DETECTED Final   Metapneumovirus NOT DETECTED NOT DETECTED Final   Rhinovirus / Enterovirus NOT DETECTED NOT DETECTED Final   Influenza A NOT DETECTED NOT DETECTED Final   Influenza B NOT DETECTED NOT DETECTED Final   Parainfluenza Virus 1 NOT DETECTED NOT DETECTED Final   Parainfluenza Virus 2 NOT DETECTED NOT DETECTED Final   Parainfluenza Virus 3 NOT DETECTED NOT DETECTED Final   Parainfluenza Virus 4 NOT DETECTED NOT DETECTED Final   Respiratory Syncytial Virus NOT DETECTED NOT DETECTED Final   Bordetella pertussis NOT DETECTED NOT DETECTED Final   Bordetella Parapertussis NOT DETECTED NOT DETECTED Final   Chlamydophila pneumoniae NOT DETECTED NOT DETECTED Final   Mycoplasma pneumoniae NOT DETECTED NOT DETECTED Final    Comment: Performed at [facility], [address] [place], [place] [address]    Labs: CBC: No results for input(s): "WBC", "NEUTROABS", "HGB", "HCT", "MCV", "PLT" in the last 168 hours. Basic Metabolic Panel: Recent Labs  Lab 03/16/22 0600 03/17/22 0517 03/18/22 0315 03/19/22 0500 03/20/22 0520 03/21/22 0635 03/22/22 0335  NA 139   < > 140 140 138 139 139  K 4.2   < > 3.9 3.8 4.3 4.4 4.3  CL 108   < > 109 109 109 110 110  CO2 22   < > 21* 23 20* 18* 19*  GLUCOSE 108*   < > 133* 97 113* 116* 90  BUN 51*   < > 41* 38* 38* 39* 41*  CREATININE 2.43*   < > 2.52* 2.56* 2.44* 2.51* 2.62*  CALCIUM 8.4*   < > 8.6* 8.8* 8.7* 8.8* 8.8*  MG 2.0  --   --  2.1 2.1 2.0 1.9  PHOS  --   --   --   --   --   --  3.1    < > = values in this interval not displayed.   Liver Function Tests: Recent Labs  Lab 03/22/22 0335  ALBUMIN 2.7*   CBG: No results for input(s): "GLUCAP" in the last 168 hours.  Discharge time spent: greater than 30 minutes.  Signed: Tawni Millers, MD [facility] 03/22/2022

## 2022-03-22 NOTE — Plan of Care (Signed)
  Problem: Education: Goal: Knowledge of disease or condition will improve Outcome: Adequate for Discharge Goal: Understanding of medication regimen will improve Outcome: Adequate for Discharge Goal: Individualized Educational Video(s) Outcome: Adequate for Discharge   Problem: Activity: Goal: Ability to tolerate increased activity will improve Outcome: Adequate for Discharge   Problem: Cardiac: Goal: Ability to achieve and maintain adequate cardiopulmonary perfusion will improve Outcome: Adequate for Discharge   Problem: Health Behavior/Discharge Planning: Goal: Ability to safely manage health-related needs after discharge will improve Outcome: Adequate for Discharge   Problem: Education: Goal: Knowledge of General Education information will improve Description: Including pain rating scale, medication(s)/side effects and non-pharmacologic comfort measures Outcome: Adequate for Discharge   Problem: Health Behavior/Discharge Planning: Goal: Ability to manage health-related needs will improve Outcome: Adequate for Discharge   Problem: Clinical Measurements: Goal: Ability to maintain clinical measurements within normal limits will improve Outcome: Adequate for Discharge Goal: Will remain free from infection Outcome: Adequate for Discharge Goal: Diagnostic test results will improve Outcome: Adequate for Discharge Goal: Respiratory complications will improve Outcome: Adequate for Discharge Goal: Cardiovascular complication will be avoided Outcome: Adequate for Discharge   Problem: Activity: Goal: Risk for activity intolerance will decrease Outcome: Adequate for Discharge   Problem: Nutrition: Goal: Adequate nutrition will be maintained Outcome: Adequate for Discharge   Problem: Coping: Goal: Level of anxiety will decrease Outcome: Adequate for Discharge   Problem: Elimination: Goal: Will not experience complications related to bowel motility Outcome: Adequate for  Discharge Goal: Will not experience complications related to urinary retention Outcome: Adequate for Discharge   Problem: Pain Managment: Goal: General experience of comfort will improve Outcome: Adequate for Discharge   Problem: Safety: Goal: Ability to remain free from injury will improve Outcome: Adequate for Discharge   Problem: Skin Integrity: Goal: Risk for impaired skin integrity will decrease Outcome: Adequate for Discharge   Problem: Acute Rehab OT Goals (only OT should resolve) Goal: Pt. Will Perform Lower Body Bathing Outcome: Adequate for Discharge Goal: Pt. Will Perform Lower Body Dressing Outcome: Adequate for Discharge Goal: Pt. Will Transfer To Toilet Outcome: Adequate for Discharge Goal: Pt. Will Perform Toileting-Clothing Manipulation Outcome: Adequate for Discharge Goal: Pt. Will Perform Tub/Shower Transfer Outcome: Adequate for Discharge   Problem: Acute Rehab PT Goals(only PT should resolve) Goal: Patient Will Transfer Sit To/From Stand Outcome: Adequate for Discharge Goal: Pt Will Ambulate Outcome: Adequate for Discharge

## 2022-03-22 NOTE — Progress Notes (Signed)
Remote pacemaker transmission.   

## 2022-03-22 NOTE — Progress Notes (Signed)
Explained discharge instructions to patient. Reviewed follow up appointment and next medication administration times. Also reviewed education. Patient verbalized having an understanding for instructions given. All belongings are in the patient's possession to include TOC meds. IV and telemetry were removed. CCMD was notified. No other needs verbalized. Transport downstairs to the discharge lounge to wait for transport home.

## 2022-03-22 NOTE — Progress Notes (Addendum)
Patient ID: Jordan Oconnell, male   DOB: [birth year], 82 y.o.   MRN: [id number]     Advanced Heart Failure Rounding Note  PCP-Cardiologist: Sanda Klein, MD   Subjective:    Remains in NSR.   Co-ox 56%. CVP 3   Creatinine stable 2.56 => 2.44 => 2.44->2.5=>2.6 today   Feels well today. Ambulating w/o difficulty, denies CP, dyspnea and palpitations. Appetite is good. Ate all of breakfast.    Objective:   Weight Range: 80.7 kg Body mass index is 23.47 kg/m.   Vital Signs:   Temp:  [97.3 F (36.3 C)-98.2 F (36.8 C)] 98 F (36.7 C) (02/26 0342) Pulse Rate:  [70-84] 73 (02/26 0342) Resp:  [16-23] 16 (02/26 0342) BP: (122-144)/(67-88) 130/67 (02/26 0342) SpO2:  [95 %-100 %] 100 % (02/26 0342) Weight:  [80.7 kg] 80.7 kg (02/26 0342) Last BM Date : 03/21/22  Weight change: Filed Weights   03/19/22 0513 03/20/22 0400 03/22/22 0342  Weight: 81.3 kg 79 kg 80.7 kg    Intake/Output:   Intake/Output Summary (Last 24 hours) at 03/22/2022 0818 Last data filed at 03/21/2022 1940 Gross per 24 hour  Intake 1143.95 ml  Output 750 ml  Net 393.95 ml      Physical Exam   CVP 3  General:  Well appearing. No respiratory difficulty HEENT: normal Neck: supple. no JVD. Carotids 2+ bilat; no bruits. No lymphadenopathy or thyromegaly appreciated. Cor: PMI nondisplaced. Regular rate & rhythm. No rubs, gallops or murmurs. Lungs: clear Abdomen: soft, nontender, nondistended. No hepatosplenomegaly. No bruits or masses. Good bowel sounds. Extremities: no cyanosis, clubbing, rash, edema + RUE PICC  Neuro: alert & oriented x 3, cranial nerves grossly intact. moves all 4 extremities w/o difficulty. Affect pleasant.     Telemetry   NSR with LB pacing (personally reviewed)  EKG    N/A   Labs    CBC No results for input(s): "WBC", "NEUTROABS", "HGB", "HCT", "MCV", "PLT" in the last 72 hours.  Basic Metabolic Panel Recent Labs    03/21/22 0635 03/22/22 0335  NA 139 139  K  4.4 4.3  CL 110 110  CO2 18* 19*  GLUCOSE 116* 90  BUN 39* 41*  CREATININE 2.51* 2.62*  CALCIUM 8.8* 8.8*  MG 2.0 1.9  PHOS  --  3.1    BNP: BNP (last 3 results) Recent Labs    03/11/22 1811 03/13/22 0820  BNP 3,602.4* 1,669.7*     Medications:     Scheduled Medications:  ALPRAZolam  0.25 mg Oral QHS   amiodarone  400 mg Oral BID   apixaban  2.5 mg Oral BID   carvedilol  6.25 mg Oral BID WC   Chlorhexidine Gluconate Cloth  6 each Topical Daily   vitamin B-12  1,000 mcg Oral Daily   ferrous sulfate  325 mg Oral Q breakfast   latanoprost  1 drop Both Eyes QHS   levothyroxine  50 mcg Oral QHS   sodium chloride flush  10-40 mL Intracatheter Q12H   Infusions:     PRN Medications: alum & mag hydroxide-simeth, levalbuterol, loperamide, ondansetron **OR** ondansetron (ZOFRAN) IV, mouth rinse, sodium chloride flush    Patient Profile   Jordan Oconnell is an 82 yo M with 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   Assessment/Plan   1. Atrial fibrillation/flutter: Persistent, admitted  with RVR.  30% AF/AFL by device interrogation over the last few months. Now on amiodarone gtt and maintaining NSR after DCCV 2/6 and again 2/16.  Possible that fall in EF is due to tachy-mediated CMP with loss of left bundle pacing. He has been in and out of atrial fibrillation while amiodarone loading, went back into NSR 2/24 early am. Maintaining NSR, now on PO amio  - Continue amiodarone 400 mg bid.  - Continue apixaban, dosed for renal function/age at 2.5 bid.  2. AKI on CKD stage IV: Baseline creatinine 2.5, up to 3.1 this admission.  Stable at 2.6 today.  Possible cardiorenal component.  He is not volume overloaded, CVP 3  - Hold diuretics today.  3. Acute on chronic systolic  CHF: Probably nonischemic cardiomyopathy.  Cath prior to TAVR in 9/22 showed 90% stenosis in D1 and 60% ramus stenosis, not extensive CAD. Echo in 1/23 post-TAVR with EF 55%.  Echo in 1/24 with EF down to 20-25%, mildly decreased RV systolic function, and normal bioprosthetic aortic valve.  Atrial fibrillation burden is now greater, fall in EF may be due to tachycardia-mediated CMP (was in RVR at admission) combined with loss of left bundle pacing when in RVR.  He was diuresed some this admission then diuretics stopped with rising creatinine and concern for low output.  CVP < 5 today with co-ox 56%.  Creatinine stable 2.6.    - He is left bundle pacing when not in RVR, will need to keep in NSR. Amiodarone per above  - Continue Coreg 6.25 mg bid.  - GDMT will be limited by elevated creatinine.   4. Bradycardia: S/p [employer] PPM with LB lead placed post-TAVR.  5. Severe aortic stenosis: S/p TAVR in 1/23.  Bioprosthetic aortic valve stable on 1/24 echo.   6. Fe deficiency anemia: Has had Feraheme.    Stable for d/c home today from cardiac standpoint.   Cardiac Meds for discharge Amiodarone 400 mg bid x 7 days >>200 mg bid>> x 4 wks month>>200 mg daily  Apixaban 2.5 mg bid  Coreg 6.25 mg bid  PRN Torsemide 20 mg for > 5 lb wt gain in 1 wk   We will arrange post hospital f/u in [facility] and will place appt info in AVS   Length of Stay: [address], PA-C  03/22/2022, 8:18 AM  Advanced Heart Failure Team Pager [phone] (M-F; 7a - 5p)  Please contact [facility] for night-coverage after hours (5p -7a ) and weekends on [email or web address]  Agree with above. Maintaining NSR. SCr stable. Volume status ok.  Continue amio and Eliquis. Specifics of amio load d/w PharmD. [place] for d/c.  Will arrange HF f/u.    Glori Bickers, MD  2:50 PM

## 2022-03-22 NOTE — TOC Progression Note (Signed)
Transition of Care [facility]) - Progression Note    Patient Details  Name: Jordan Oconnell MRN: [id number] Date of Birth: 05-02-1940  Transition of Care [facility]) CM/SW Contact  Erenest Rasher, RN Phone Number: [phone] 03/22/2022, 3:08 PM  Clinical Narrative:    Contacted [employer] rep, Claiborne Billings to make aware of scheduled dc home today with HH. Pt has Rollator in room.    Expected Discharge Plan: [place] Barriers to Discharge: No Barriers Identified  Expected Discharge Plan and Services   Discharge Planning Services: CM Consult Post Acute Care Choice: [place] arrangements for the past 2 months: Single Family Home Expected Discharge Date: 03/22/22               DME Arranged: Gilford Rile rolling with seat DME Agency: [employer] Date DME Agency Contacted: 03/22/22 Time DME Agency Contacted: 1504   HH Arranged: RN, PT HH Agency: [place] Date HH Agency Contacted: 03/22/22 Time [place]: 1504 Representative spoke with at [place]: Otila Kluver   Social Determinants of Health (SDOH) Interventions SDOH Screenings   Food Insecurity: No Food Insecurity (03/13/2022)  Housing: Low Risk  (03/13/2022)  Transportation Needs: No Transportation Needs (03/13/2022)  Utilities: Not At Risk (03/13/2022)  Tobacco Use: Low Risk  (03/14/2022)    Readmission Risk Interventions     No data to display

## 2022-03-24 DIAGNOSIS — C641 Malignant neoplasm of right kidney, except renal pelvis: Secondary | ICD-10-CM | POA: Diagnosis not present

## 2022-03-24 DIAGNOSIS — I495 Sick sinus syndrome: Secondary | ICD-10-CM | POA: Diagnosis not present

## 2022-03-24 DIAGNOSIS — K5790 Diverticulosis of intestine, part unspecified, without perforation or abscess without bleeding: Secondary | ICD-10-CM | POA: Diagnosis not present

## 2022-03-24 DIAGNOSIS — N400 Enlarged prostate without lower urinary tract symptoms: Secondary | ICD-10-CM | POA: Diagnosis not present

## 2022-03-24 DIAGNOSIS — I5023 Acute on chronic systolic (congestive) heart failure: Secondary | ICD-10-CM | POA: Diagnosis not present

## 2022-03-24 DIAGNOSIS — E039 Hypothyroidism, unspecified: Secondary | ICD-10-CM | POA: Diagnosis not present

## 2022-03-24 DIAGNOSIS — I130 Hypertensive heart and chronic kidney disease with heart failure and stage 1 through stage 4 chronic kidney disease, or unspecified chronic kidney disease: Secondary | ICD-10-CM | POA: Diagnosis not present

## 2022-03-24 DIAGNOSIS — I480 Paroxysmal atrial fibrillation: Secondary | ICD-10-CM | POA: Diagnosis not present

## 2022-03-24 DIAGNOSIS — N184 Chronic kidney disease, stage 4 (severe): Secondary | ICD-10-CM | POA: Diagnosis not present

## 2022-03-25 ENCOUNTER — Telehealth: Payer: Self-pay | Admitting: Cardiology

## 2022-03-25 NOTE — Telephone Encounter (Signed)
Spoke with patient and he requested I discuss directly with his son.  Patient has a Audiological scientist Fat Scale.   This scale puts off electrical currents through the body and according to the Careers information officer with [employer] that I spoke with, the current can be strong enough to interrupt pacing.  Patient should not use this device.   Called patient's son back and made him aware.   They have our direct device number should there be any other questions or concerns.

## 2022-03-25 NOTE — Telephone Encounter (Signed)
Pt would like a callback from nurse regarding weight scale that he was told not to use due to him having a pacemaker. Please advise.

## 2022-03-26 ENCOUNTER — Emergency Department (HOSPITAL_COMMUNITY): Payer: Medicare PPO

## 2022-03-26 ENCOUNTER — Emergency Department (HOSPITAL_COMMUNITY)
Admission: EM | Admit: 2022-03-26 | Discharge: 2022-03-26 | Disposition: A | Discharge status: Home / Self Care | Payer: Medicare PPO | Attending: Student | Admitting: Student

## 2022-03-26 ENCOUNTER — Encounter (HOSPITAL_COMMUNITY): Payer: Self-pay

## 2022-03-26 ENCOUNTER — Other Ambulatory Visit: Payer: Self-pay

## 2022-03-26 DIAGNOSIS — Z85528 Personal history of other malignant neoplasm of kidney: Secondary | ICD-10-CM | POA: Insufficient documentation

## 2022-03-26 DIAGNOSIS — W01198A Fall on same level from slipping, tripping and stumbling with subsequent striking against other object, initial encounter: Secondary | ICD-10-CM | POA: Diagnosis not present

## 2022-03-26 DIAGNOSIS — N179 Acute kidney failure, unspecified: Secondary | ICD-10-CM | POA: Diagnosis not present

## 2022-03-26 DIAGNOSIS — R9082 White matter disease, unspecified: Secondary | ICD-10-CM | POA: Insufficient documentation

## 2022-03-26 DIAGNOSIS — R7989 Other specified abnormal findings of blood chemistry: Secondary | ICD-10-CM | POA: Diagnosis not present

## 2022-03-26 DIAGNOSIS — S0990XA Unspecified injury of head, initial encounter: Secondary | ICD-10-CM | POA: Diagnosis not present

## 2022-03-26 DIAGNOSIS — Y92090 Kitchen in other non-institutional residence as the place of occurrence of the external cause: Secondary | ICD-10-CM | POA: Diagnosis not present

## 2022-03-26 DIAGNOSIS — Z950 Presence of cardiac pacemaker: Secondary | ICD-10-CM | POA: Insufficient documentation

## 2022-03-26 DIAGNOSIS — I130 Hypertensive heart and chronic kidney disease with heart failure and stage 1 through stage 4 chronic kidney disease, or unspecified chronic kidney disease: Secondary | ICD-10-CM | POA: Insufficient documentation

## 2022-03-26 DIAGNOSIS — Z8551 Personal history of malignant neoplasm of bladder: Secondary | ICD-10-CM | POA: Insufficient documentation

## 2022-03-26 DIAGNOSIS — W19XXXA Unspecified fall, initial encounter: Secondary | ICD-10-CM

## 2022-03-26 DIAGNOSIS — I5023 Acute on chronic systolic (congestive) heart failure: Secondary | ICD-10-CM | POA: Diagnosis not present

## 2022-03-26 DIAGNOSIS — S199XXA Unspecified injury of neck, initial encounter: Secondary | ICD-10-CM | POA: Diagnosis not present

## 2022-03-26 DIAGNOSIS — I4891 Unspecified atrial fibrillation: Secondary | ICD-10-CM | POA: Diagnosis not present

## 2022-03-26 DIAGNOSIS — N184 Chronic kidney disease, stage 4 (severe): Secondary | ICD-10-CM | POA: Insufficient documentation

## 2022-03-26 DIAGNOSIS — Z7901 Long term (current) use of anticoagulants: Secondary | ICD-10-CM | POA: Insufficient documentation

## 2022-03-26 DIAGNOSIS — Z79899 Other long term (current) drug therapy: Secondary | ICD-10-CM | POA: Insufficient documentation

## 2022-03-26 LAB — CBC WITH DIFFERENTIAL/PLATELET
Abs Immature Granulocytes: 0.1 10*3/uL — ABNORMAL HIGH (ref 0.00–0.07)
Basophils Absolute: 0 10*3/uL (ref 0.0–0.1)
Basophils Relative: 0 %
Eosinophils Absolute: 0.2 10*3/uL (ref 0.0–0.5)
Eosinophils Relative: 3 %
HCT: 42.1 % (ref 39.0–52.0)
Hemoglobin: 12.3 g/dL — ABNORMAL LOW (ref 13.0–17.0)
Lymphocytes Relative: 17 %
Lymphs Abs: 1.1 10*3/uL (ref 0.7–4.0)
MCH: 26 pg (ref 26.0–34.0)
MCHC: 29.2 g/dL — ABNORMAL LOW (ref 30.0–36.0)
MCV: 89 fL (ref 80.0–100.0)
Metamyelocytes Relative: 1 %
Monocytes Absolute: 0.3 10*3/uL (ref 0.1–1.0)
Monocytes Relative: 5 %
Neutro Abs: 4.6 10*3/uL (ref 1.7–7.7)
Neutrophils Relative %: 74 %
Platelets: 127 10*3/uL — ABNORMAL LOW (ref 150–400)
RBC: 4.73 MIL/uL (ref 4.22–5.81)
RDW: 22.9 % — ABNORMAL HIGH (ref 11.5–15.5)
WBC: 6.2 10*3/uL (ref 4.0–10.5)
nRBC: 0 % (ref 0.0–0.2)
nRBC: 0 /100 WBC

## 2022-03-26 LAB — COMPREHENSIVE METABOLIC PANEL
ALT: 16 U/L (ref 0–44)
AST: 22 U/L (ref 15–41)
Albumin: 3.9 g/dL (ref 3.5–5.0)
Alkaline Phosphatase: 59 U/L (ref 38–126)
Anion gap: 12 (ref 5–15)
BUN: 66 mg/dL — ABNORMAL HIGH (ref 8–23)
CO2: 21 mmol/L — ABNORMAL LOW (ref 22–32)
Calcium: 9.5 mg/dL (ref 8.9–10.3)
Chloride: 105 mmol/L (ref 98–111)
Creatinine, Ser: 3.18 mg/dL — ABNORMAL HIGH (ref 0.61–1.24)
GFR, Estimated: 19 mL/min — ABNORMAL LOW (ref >60)
Glucose, Bld: 88 mg/dL (ref 70–99)
Potassium: 4.6 mmol/L (ref 3.5–5.1)
Sodium: 138 mmol/L (ref 135–145)
Total Bilirubin: 0.7 mg/dL (ref 0.3–1.2)
Total Protein: 7.4 g/dL (ref 6.5–8.1)

## 2022-03-26 MED ORDER — LACTATED RINGERS IV BOLUS
1000.0000 mL | Freq: Once | INTRAVENOUS | Status: AC
  Administered 2022-03-26: 1000 mL via INTRAVENOUS

## 2022-03-26 NOTE — Progress Notes (Signed)
Orthopedic Tech Progress Note Patient Details:  Jordan Oconnell May 24, 1940 [id number]  Level 2 trauma   Patient ID: [name], male   DOB: May 01, 1940, 82 y.o.   MRN: [id number]  [name] 03/26/2022, 8:53 PM

## 2022-03-26 NOTE — Progress Notes (Signed)
   03/26/22 2013  Spiritual Encounters  Type of Visit Initial  Care provided to: Pt and family  Conversation partners present during encounter Nurse  Referral source Code page  Reason for visit Code  OnCall Visit Yes   Chp responded to code page. Pt unavailable as the medical team di their work.  Pt's son was there as support person. Chp remains available if needed.

## 2022-03-26 NOTE — ED Triage Notes (Signed)
Pt fell at home in the kitchen and hit his head on the cabinet. He is on Eliquis. No LOC. Pt is alert and oriented and is ambulatory.

## 2022-03-27 NOTE — ED Provider Notes (Signed)
[facility] Provider Note  CSN: [id number] Arrival date & time: 03/26/22 2005  Chief Complaint(s) Fall  HPI Jordan Oconnell is a 82 y.o. male with PMH BPH, CKD 4, HTN, paroxysmal A-fib on flecainide and diltiazem, left renal cell carcinoma status post nephrectomy, aortic stenosis status post TAVR on Eliquis who presents emergency department for evaluation of a fall.  Patient states that he had a mechanical fall where he slipped on his shoe in the kitchen and hit his head on the cabinet.  He denies any loss of consciousness, nausea, vomiting, chest pain, shortness of breath or any systemic or traumatic complaints.  He states that his son is a respiratory therapist and told him he needs to come to the emergency department immediately if he falls, strikes his head while on his blood thinner.  Currently in the emergency department he remains asymptomatic with no external signs of trauma.   Past Medical History Past Medical History:  Diagnosis Date   Bladder cancer ([place])    BPH (benign prostatic hyperplasia)    CKD (chronic kidney disease), stage IV (HCC)    Colon polyps    Diverticulosis    GERD (gastroesophageal reflux disease)    Hyperlipidemia    pt denies   Hypertension    PAF (paroxysmal atrial fibrillation) (HCC)    on Flecanide and diltiazem. No OAC given recurrent hematuria   Renal cell carcinoma ([place]) 2008   left   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 Burt Knack and Dr. Cyndia Bent   Severe aortic stenosis 10/05/2020   Patient Active Problem List   Diagnosis Date Noted   Acute renal failure superimposed on stage 4 chronic kidney disease ([place]) 03/13/2022   Acute on chronic systolic CHF (congestive heart failure) ([place]) 03/13/2022   Tachycardia-bradycardia syndrome ([place]) 03/11/2022   Atrial fibrillation with rapid ventricular response ([place]) 03/11/2022    Atypical atrial flutter ([place]) 03/03/2022   Pacemaker 02/12/2021   LBBB (left bundle branch block) 01/29/2021   1st degree AV block 01/29/2021   S/P TAVR (transcatheter aortic valve replacement) 01/27/2021   Severe aortic stenosis 10/05/2020   CKD (chronic kidney disease) stage 4, GFR 15-29 ml/min (HCC) 02/01/2017   Pneumonia 07/26/2013    Class: Acute   Intractable hiccups 07/22/2013    Class: Chronic   Anemia 07/22/2013    Class: Acute   Protein-calorie malnutrition, severe ([place]) 07/19/2013   Syncope and collapse 07/17/2013   Bladder cancer ([place]) 07/17/2013   Urothelial cancer ([place]) 09/03/2012   Essential hypertension 09/03/2012   PAF (paroxysmal atrial fibrillation) ([place]) 09/03/2012   Home Medication(s) Prior to Admission medications   Medication Sig Start Date End Date Taking? Authorizing Provider  ALPRAZolam (XANAX) 0.25 MG tablet Take 0.25 mg by mouth at bedtime.    [provider]  amiodarone (PACERONE) 200 MG tablet Take 2 tablets twice daily for 7 days (until 03/28/22), then starting on 03/29/22 continue with 1 tablet twice daily for 4 weeks until 04/25/22, then starting on 04/26/22 take one tablet daily. 03/22/22   Arrien, Jimmy Picket, MD  azithromycin (ZITHROMAX) 500 MG tablet Take 1 tablet by mouth 1 hour before dental procedures and cleanings 02/06/21   Eileen Stanford, PA-C  carvedilol (COREG) 6.25 MG tablet Take 1 tablet (6.25 mg total) by mouth 2 (two) times daily with a meal. 03/22/22 04/21/22  Arrien, Jimmy Picket, MD  Jordan Oconnell  2.5 MG TABS tablet Take 2.5 mg by mouth 2 (two) times daily. 01/28/22   [provider]  ferrous sulfate 325 (65 FE) MG EC tablet Take 1 tablet (325 mg total) by mouth in the morning and at bedtime. 03/02/22   Croitoru, Mihai, MD  latanoprost (XALATAN) 0.005 % ophthalmic solution Place 1 drop into both eyes at bedtime. 09/08/20   [provider]  levothyroxine (SYNTHROID) 50 MCG tablet Take 50 mcg by mouth at bedtime.     [provider]  mirtazapine (REMERON) 15 MG tablet Take 7.5 mg by mouth at bedtime. 08/09/20   [provider]  Pembrolizumab (KEYTRUDA IV) Inject into the vein every 6 (six) weeks. Infusion    [provider]  sodium bicarbonate 650 MG tablet Take 1,300 mg by mouth 2 (two) times daily. 09/01/20   [provider]  torsemide (DEMADEX) 20 MG tablet Take 1 tablet (20 mg total) by mouth daily as needed (weight gain 2 to 3 lbs in 24 hrs or 5 lbs in 7 days.). 03/22/22   Arrien, Jimmy Picket, MD  hydrALAZINE (APRESOLINE) 50 MG tablet Take 50 mg by mouth 2 (two) times daily. Patient not taking: Reported on 03/01/2022  03/01/22  [provider]                                                                                                                                    Past Surgical History Past Surgical History:  Procedure Laterality Date   APPENDECTOMY     BLADDER SURGERY  2008   transurethral resection/resection of prostatic urethra   BOWEL RESECTION     CARDIOVERSION N/A 03/03/2022   Procedure: CARDIOVERSION;  Surgeon: Buford Dresser, MD;  Location: [facility] ENDOSCOPY;  Service: Cardiovascular;  Laterality: N/A;   CARDIOVERSION N/A 03/12/2022   Procedure: CARDIOVERSION;  Surgeon: Berniece Salines, DO;  Location: [facility] ENDOSCOPY;  Service: Cardiovascular;  Laterality: N/A;   ESOPHAGOGASTRODUODENOSCOPY N/A 07/24/2013   Procedure: ESOPHAGOGASTRODUODENOSCOPY (EGD);  Surgeon: [name], MD;  Location: [name] ENDOSCOPY;  Service: Endoscopy;  Laterality: N/A;   INTRAOPERATIVE TRANSTHORACIC ECHOCARDIOGRAM N/A 01/27/2021   Procedure: INTRAOPERATIVE TRANSTHORACIC ECHOCARDIOGRAM;  Surgeon: Sherren Mocha, MD;  Location: [facility] CV LAB;  Service: Open Heart Surgery;  Laterality: N/A;   laparoscopic surgery  2012   laser   (?)   NEPHRECTOMY  2007   partial, left   NEPHROSTOMY  2011   stent   NM MYOCAR PERF WALL MOTION  09/08/2010   Normal   PACEMAKER IMPLANT N/A  02/12/2021   Procedure: PACEMAKER IMPLANT;  Surgeon: Vickie Epley, MD;  Location: [facility] CV LAB;  Service: Cardiovascular;  Laterality: N/A;   RIGHT HEART CATH AND CORONARY ANGIOGRAPHY N/A 10/06/2020   Procedure: RIGHT HEART CATH AND CORONARY ANGIOGRAPHY;  Surgeon: Sherren Mocha, MD;  Location: [facility] CV LAB;  Service: Cardiovascular;  Laterality: N/A;   ROBOT ASSISTED LAPAROSCOPIC COMPLETE CYSTECT ILEAL  CONDUIT     TRANSCATHETER AORTIC VALVE REPLACEMENT, TRANSFEMORAL Right 01/27/2021   Procedure: TRANSCATHETER AORTIC VALVE REPLACEMENT, TRANSFEMORAL;  Surgeon: Sherren Mocha, MD;  Location: [facility] CV LAB;  Service: Open Heart Surgery;  Laterality: Right;   US ECHOCARDIOGRAPHY  0000000   mild diastolic dysfunction,mild dilated LA,mild MR,AI,mildly dilated aortic root   Family History Family History  Problem Relation Age of Onset   Ovarian cancer Mother    Colon cancer Mother    Colon cancer Father    Crohn's disease Son    Breast cancer Daughter    Colon cancer Brother     Social History Social History   Tobacco Use   Smoking status: Never   Smokeless tobacco: Never  Vaping Use   Vaping Use: Never used  Substance Use Topics   Alcohol use: Yes    Comment: occasionally   Drug use: No   Allergies Amoxicillin, Doxycycline, and Flomax [tamsulosin hcl]  Review of Systems Review of Systems  All other systems reviewed and are negative.   Physical Exam Vital Signs  I have reviewed the triage vital signs BP 100/64   Pulse 70   Temp (!) 97.5 F (36.4 C)   Resp 20   Ht '6\' 1"'$  (1.854 m)   Wt 80.7 kg   SpO2 98%   BMI 23.47 kg/m   Physical Exam Constitutional:      General: He is not in acute distress.    Appearance: Normal appearance.  HENT:     Head: Normocephalic and atraumatic.     Nose: No congestion or rhinorrhea.  Eyes:     General:        Right eye: No discharge.        Left eye: No discharge.     Extraocular Movements: Extraocular  movements intact.     Pupils: Pupils are equal, round, and reactive to light.  Cardiovascular:     Rate and Rhythm: Normal rate and regular rhythm.     Heart sounds: No murmur heard. Pulmonary:     Effort: No respiratory distress.     Breath sounds: No wheezing or rales.  Abdominal:     General: There is no distension.     Tenderness: There is no abdominal tenderness.  Musculoskeletal:        General: Normal range of motion.     Cervical back: Normal range of motion.  Skin:    General: Skin is warm and dry.  Neurological:     General: No focal deficit present.     Mental Status: He is alert.     ED Results and Treatments Labs (all labs ordered are listed, but only abnormal results are displayed) Labs Reviewed  COMPREHENSIVE METABOLIC PANEL - Abnormal; Notable for the following components:      Result Value   CO2 21 (*)    BUN 66 (*)    Creatinine, Ser 3.18 (*)    GFR, Estimated 19 (*)    All other components within normal limits  CBC WITH DIFFERENTIAL/PLATELET - Abnormal; Notable for the following components:   Hemoglobin 12.3 (*)    MCHC 29.2 (*)    RDW 22.9 (*)    Platelets 127 (*)    Abs Immature Granulocytes 0.10 (*)    All other components within normal limits  Radiology CT Head Wo Contrast  Result Date: 03/26/2022 CLINICAL DATA:  Fall, head and neck trauma EXAM: CT HEAD WITHOUT CONTRAST CT CERVICAL SPINE WITHOUT CONTRAST TECHNIQUE: Multidetector CT imaging of the head and cervical spine was performed following the standard protocol without intravenous contrast. Multiplanar CT image reconstructions of the cervical spine were also generated. RADIATION DOSE REDUCTION: This exam was performed according to the departmental dose-optimization program which includes automated exposure control, adjustment of the mA and/or kV according to patient size and/or  use of iterative reconstruction technique. COMPARISON:  None Available. FINDINGS: CT HEAD FINDINGS Brain: No evidence of acute infarction, hemorrhage, hydrocephalus, extra-axial collection or mass lesion/mass effect. Periventricular and deep white matter hypodensity. Prominent frontal subdural spaces, likely secondary to global cerebral volume loss. 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 Vascular: No hyperdense vessel or unexpected calcification. Skull: Normal. Negative for fracture or focal lesion. Sinuses/Orbits: No acute finding. Other: None. CT CERVICAL SPINE FINDINGS Alignment: Normal. Skull base and vertebrae: No acute fracture. No primary bone lesion or focal pathologic process. Soft tissues and spinal canal: No prevertebral fluid or swelling. No visible canal hematoma. Disc levels:  Minimal disc degenerative change. Upper chest: Negative. Other: None. IMPRESSION: 1. No acute intracranial pathology. Small-vessel white matter disease and global cerebral volume loss. 2. No fracture or static subluxation of the cervical spine. Electronically Signed   By: Delanna Ahmadi M.D.   On: 03/26/2022 21:17   CT Cervical Spine Wo Contrast  Result Date: 03/26/2022 CLINICAL DATA:  Fall, head and neck trauma EXAM: CT HEAD WITHOUT CONTRAST CT CERVICAL SPINE WITHOUT CONTRAST TECHNIQUE: Multidetector CT imaging of the head and cervical spine was performed following the standard protocol without intravenous contrast. Multiplanar CT image reconstructions of the cervical spine were also generated. RADIATION DOSE REDUCTION: This exam was performed according to the departmental dose-optimization program which includes automated exposure control, adjustment of the mA and/or kV according to patient size and/or use of iterative reconstruction technique. COMPARISON:  None Available. FINDINGS: CT HEAD FINDINGS Brain: No evidence of acute infarction, hemorrhage, hydrocephalus, extra-axial collection or  mass lesion/mass effect. Periventricular and deep white matter hypodensity. Prominent frontal subdural spaces, likely secondary to global cerebral volume loss. 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 Vascular: No hyperdense vessel or unexpected calcification. Skull: Normal. Negative for fracture or focal lesion. Sinuses/Orbits: No acute finding. Other: None. CT CERVICAL SPINE FINDINGS Alignment: Normal. Skull base and vertebrae: No acute fracture. No primary bone lesion or focal pathologic process. Soft tissues and spinal canal: No prevertebral fluid or swelling. No visible canal hematoma. Disc levels:  Minimal disc degenerative change. Upper chest: Negative. Other: None. IMPRESSION: 1. No acute intracranial pathology. Small-vessel white matter disease and global cerebral volume loss. 2. No fracture or static subluxation of the cervical spine. Electronically Signed   By: Delanna Ahmadi M.D.   On: 03/26/2022 21:17    Pertinent labs & imaging results that were available during my care of the patient were reviewed by me and considered in my medical decision making (see MDM for details).  Medications Ordered in ED Medications  lactated ringers bolus 1,000 mL (0 mLs Intravenous Stopped 03/26/22 2239)  Procedures .Critical Care  Performed by: Teressa Lower, MD Authorized by: Teressa Lower, MD   Critical care provider statement:    Critical care time (minutes):  30   Critical care was necessary to treat or prevent imminent or life-threatening deterioration of the following conditions:  Trauma   Critical care was time spent personally by me on the following activities:  Development of treatment plan with patient or surrogate, discussions with consultants, evaluation of patient's response to treatment, examination of patient, ordering and review of  laboratory studies, ordering and review of radiographic studies, ordering and performing treatments and interventions, pulse oximetry, re-evaluation of patient's condition and review of old charts   (including critical care time)  Medical Decision Making / ED Course   This patient presents to the ED for concern of fall, this involves an extensive number of treatment options, and is a complaint that carries with it a high risk of complications and morbidity.  The differential diagnosis includes closed head injury, intracranial hemorrhage, fracture, contusion, concussion  MDM: Patient seen emergency room for evaluation of a fall on blood thinners.  Patient arrives as a level 2 trauma and primary survey unremarkable.  Secondary survey also unremarkable.  Initial laboratory evaluation with a hemoglobin of 12.3, BUN elevated to 66, creatinine 3.18 which is an elevation from the patient's baseline of about 2.6.  Fluid resuscitation initiated.  Trauma imaging including CT head and C-spine without acute intracranial pathology or fractures in the neck.  I had a discussion with the patient about his rising creatinine and encouraged the patient to follow-up outpatient with his nephrologist for recheck after fluid resuscitation in the emergency department.  Patient received 1 L lactated Ringer's and was discharged with outpatient nephrology follow-up.   Additional history obtained: -Additional history obtained from son -External records from outside source obtained and reviewed including: Chart review including previous notes, labs, imaging, consultation notes   Lab Tests: -I ordered, reviewed, and interpreted labs.   The pertinent results include:   Labs Reviewed  COMPREHENSIVE METABOLIC PANEL - Abnormal; Notable for the following components:      Result Value   CO2 21 (*)    BUN 66 (*)    Creatinine, Ser 3.18 (*)    GFR, Estimated 19 (*)    All other components within normal limits  CBC WITH  DIFFERENTIAL/PLATELET - Abnormal; Notable for the following components:   Hemoglobin 12.3 (*)    MCHC 29.2 (*)    RDW 22.9 (*)    Platelets 127 (*)    Abs Immature Granulocytes 0.10 (*)    All other components within normal limits      EKG   EKG Interpretation  Date/Time:  Friday March 26 2022 20:17:34 EST Ventricular Rate:  70 PR Interval:  221 QRS Duration: 167 QT Interval:  509 QTC Calculation: 550 R Axis:   -87 Text Interpretation: A-V dual-paced rhythm with some inhibition Confirmed by Emmons ([id number]) on 03/27/2022 1:10:21 PM         Imaging Studies ordered: I ordered imaging studies including CT head and C-spine I independently visualized and interpreted imaging. I agree with the radiologist interpretation   Medicines ordered and prescription drug management: Meds ordered this encounter  Medications   lactated ringers bolus 1,000 mL    -I have reviewed the patients home medicines and have made adjustments as needed  Critical interventions Trauma activation and evaluation    Cardiac Monitoring: The patient was maintained on a cardiac monitor.  I  personally viewed and interpreted the cardiac monitored which showed an underlying rhythm of: AV paced rhythm  Social Determinants of Health:  Factors impacting patients care include: none   Reevaluation: After the interventions noted above, I reevaluated the patient and found that they have :improved  Co morbidities that complicate the patient evaluation  Past Medical History:  Diagnosis Date   Bladder cancer ([place])    BPH (benign prostatic hyperplasia)    CKD (chronic kidney disease), stage IV (HCC)    Colon polyps    Diverticulosis    GERD (gastroesophageal reflux disease)    Hyperlipidemia    pt denies   Hypertension    PAF (paroxysmal atrial fibrillation) ([place])    on Flecanide and diltiazem. No OAC given recurrent hematuria   Renal cell carcinoma ([place]) 2008   left   S/P TAVR (transcatheter  aortic valve replacement) 01/27/2021   s/p TAVR with a 29 mm Edwards S3UR via the TF approach by Dr. Burt Knack and Dr. Cyndia Bent   Severe aortic stenosis 10/05/2020      Dispostion: I considered admission for this patient, but he does not meet inpatient criteria for admission and will follow-up outpatient with nephrology for his creatinine elevation.     Final Clinical Impression(s) / ED Diagnoses Final diagnoses:  Fall, initial encounter  AKI (acute kidney injury) [facility])     '@PCDICTATION'$ @    Teressa Lower, MD 03/27/22 1311

## 2022-03-29 ENCOUNTER — Telehealth: Payer: Self-pay | Admitting: Cardiology

## 2022-03-29 NOTE — Telephone Encounter (Signed)
Patient was given a body fat scale from the hospital at his discharge in February (per son).  I do not know who or why he has the scale only that it is contraindicated in its use for his Pacemaker.  I explained this to patient's son. (See my note on 03/25/22). Because of how body fat scales work they are contraindicated for patient's with devices.   I do not know who to tell him to contact.  Will route back for consideration.  Maybe someone in Heart and Vascular will know?  Will forward back to triage.

## 2022-03-29 NOTE — Telephone Encounter (Signed)
Pt's son would a like a callback regarding other options for body fat scales being that he's not able to use the one that was provided by the hospital because of pacemaker. Son would also like to know how to return the scale that was given by hospital. Please advise.

## 2022-03-29 NOTE — Telephone Encounter (Signed)
Called to gather more information on pt concern.  Reports was opening up scale and instructions say not to use if you have a PPM.  Son spoke with [facility] in the device clinic.  Per son she spoke with [employer] and was told pt is not to use scale.  Son reports scale has not been used and would like to know if we have a scale that can be swapped.  Advised will send to primary nurse to f/u.

## 2022-03-30 ENCOUNTER — Telehealth: Payer: Self-pay | Admitting: *Deleted

## 2022-03-30 DIAGNOSIS — I480 Paroxysmal atrial fibrillation: Secondary | ICD-10-CM | POA: Diagnosis not present

## 2022-03-30 DIAGNOSIS — K5790 Diverticulosis of intestine, part unspecified, without perforation or abscess without bleeding: Secondary | ICD-10-CM | POA: Diagnosis not present

## 2022-03-30 DIAGNOSIS — I495 Sick sinus syndrome: Secondary | ICD-10-CM | POA: Diagnosis not present

## 2022-03-30 DIAGNOSIS — N184 Chronic kidney disease, stage 4 (severe): Secondary | ICD-10-CM | POA: Diagnosis not present

## 2022-03-30 DIAGNOSIS — I5023 Acute on chronic systolic (congestive) heart failure: Secondary | ICD-10-CM | POA: Diagnosis not present

## 2022-03-30 DIAGNOSIS — I130 Hypertensive heart and chronic kidney disease with heart failure and stage 1 through stage 4 chronic kidney disease, or unspecified chronic kidney disease: Secondary | ICD-10-CM | POA: Diagnosis not present

## 2022-03-30 DIAGNOSIS — N400 Enlarged prostate without lower urinary tract symptoms: Secondary | ICD-10-CM | POA: Diagnosis not present

## 2022-03-30 DIAGNOSIS — E039 Hypothyroidism, unspecified: Secondary | ICD-10-CM | POA: Diagnosis not present

## 2022-03-30 DIAGNOSIS — C641 Malignant neoplasm of right kidney, except renal pelvis: Secondary | ICD-10-CM | POA: Diagnosis not present

## 2022-03-30 NOTE — Telephone Encounter (Signed)
     Patient  visit on 03/26/2022  at [facility]  was for treatment   Have you been able to follow up with your primary care physician? going to PCP tomorrow has transportation and all medications  The patient was  able to obtain any needed medicine or equipment.  Are there diet recommendations that you are having difficulty following?  Patient expresses understanding of discharge instructions and education provided has no other needs at this time.    [place] [phone] [address] , [place] [address] Email : [email or web address]

## 2022-03-30 NOTE — Telephone Encounter (Signed)
Pt's son calling back to f/u, if they need to change the pt's weighing scale

## 2022-03-30 NOTE — Telephone Encounter (Signed)
Spoke with the patient's son and advised not to use the scale provided by the hospital. I am not sure about how/if they can return it since it was given to the patient upon discharge. They have purchased a regular scale for the patient and have started weighing him daily.

## 2022-03-31 DIAGNOSIS — N184 Chronic kidney disease, stage 4 (severe): Secondary | ICD-10-CM | POA: Diagnosis not present

## 2022-03-31 DIAGNOSIS — R7301 Impaired fasting glucose: Secondary | ICD-10-CM | POA: Diagnosis not present

## 2022-03-31 DIAGNOSIS — I480 Paroxysmal atrial fibrillation: Secondary | ICD-10-CM | POA: Diagnosis not present

## 2022-03-31 DIAGNOSIS — C679 Malignant neoplasm of bladder, unspecified: Secondary | ICD-10-CM | POA: Diagnosis not present

## 2022-03-31 DIAGNOSIS — C649 Malignant neoplasm of unspecified kidney, except renal pelvis: Secondary | ICD-10-CM | POA: Diagnosis not present

## 2022-03-31 DIAGNOSIS — I129 Hypertensive chronic kidney disease with stage 1 through stage 4 chronic kidney disease, or unspecified chronic kidney disease: Secondary | ICD-10-CM | POA: Diagnosis not present

## 2022-03-31 DIAGNOSIS — Z66 Do not resuscitate: Secondary | ICD-10-CM | POA: Diagnosis not present

## 2022-03-31 DIAGNOSIS — I10 Essential (primary) hypertension: Secondary | ICD-10-CM | POA: Diagnosis not present

## 2022-04-02 ENCOUNTER — Encounter: Payer: Self-pay | Admitting: Cardiovascular Disease

## 2022-04-02 ENCOUNTER — Ambulatory Visit: Payer: Medicare PPO | Attending: Cardiovascular Disease | Admitting: Cardiovascular Disease

## 2022-04-02 VITALS — BP 107/64 | HR 70 | Ht 73.0 in | Wt 173.4 lb

## 2022-04-02 DIAGNOSIS — K5790 Diverticulosis of intestine, part unspecified, without perforation or abscess without bleeding: Secondary | ICD-10-CM | POA: Diagnosis not present

## 2022-04-02 DIAGNOSIS — I10 Essential (primary) hypertension: Secondary | ICD-10-CM | POA: Diagnosis not present

## 2022-04-02 DIAGNOSIS — Z952 Presence of prosthetic heart valve: Secondary | ICD-10-CM

## 2022-04-02 DIAGNOSIS — I447 Left bundle-branch block, unspecified: Secondary | ICD-10-CM

## 2022-04-02 DIAGNOSIS — Z5181 Encounter for therapeutic drug level monitoring: Secondary | ICD-10-CM | POA: Diagnosis not present

## 2022-04-02 DIAGNOSIS — N184 Chronic kidney disease, stage 4 (severe): Secondary | ICD-10-CM | POA: Diagnosis not present

## 2022-04-02 DIAGNOSIS — I484 Atypical atrial flutter: Secondary | ICD-10-CM

## 2022-04-02 DIAGNOSIS — C641 Malignant neoplasm of right kidney, except renal pelvis: Secondary | ICD-10-CM | POA: Diagnosis not present

## 2022-04-02 DIAGNOSIS — I130 Hypertensive heart and chronic kidney disease with heart failure and stage 1 through stage 4 chronic kidney disease, or unspecified chronic kidney disease: Secondary | ICD-10-CM | POA: Diagnosis not present

## 2022-04-02 DIAGNOSIS — I7121 Aneurysm of the ascending aorta, without rupture: Secondary | ICD-10-CM | POA: Diagnosis not present

## 2022-04-02 DIAGNOSIS — N400 Enlarged prostate without lower urinary tract symptoms: Secondary | ICD-10-CM | POA: Diagnosis not present

## 2022-04-02 DIAGNOSIS — I5042 Chronic combined systolic (congestive) and diastolic (congestive) heart failure: Secondary | ICD-10-CM

## 2022-04-02 DIAGNOSIS — Z79899 Other long term (current) drug therapy: Secondary | ICD-10-CM | POA: Diagnosis not present

## 2022-04-02 DIAGNOSIS — I5023 Acute on chronic systolic (congestive) heart failure: Secondary | ICD-10-CM | POA: Diagnosis not present

## 2022-04-02 DIAGNOSIS — I495 Sick sinus syndrome: Secondary | ICD-10-CM | POA: Diagnosis not present

## 2022-04-02 DIAGNOSIS — I480 Paroxysmal atrial fibrillation: Secondary | ICD-10-CM | POA: Diagnosis not present

## 2022-04-02 DIAGNOSIS — E039 Hypothyroidism, unspecified: Secondary | ICD-10-CM | POA: Diagnosis not present

## 2022-04-02 NOTE — Progress Notes (Signed)
Cardiology Office Note    Date:  04/02/2022   ID:  Jordan Oconnell 05/20/1940, MRN [id number]  PCP:  Burnard Bunting, MD  Cardiologist:   Sanda Klein, MD   Chief Complaint  Patient presents with   Congestive Heart Failure   Atrial Fibrillation    History of Present Illness:  Jordan Oconnell is a 82 y.o. male with AS s/p TAVR [name] S3U 29 mm, 01/25/2021), tachycardia- bradycardia syndrome s/p dual chamber pacemaker (BSC Accolade with His bundle lead, 02/12/2021, [name]), paroxysmal atrial fibrillation, moderate aortic root aneurysm (46 mm by CT October 2022) and multiple sites of urothelial cancer involving his bladder, ureters and renal pelvises bilaterally as well as renal cell carcinoma (history of partial left nephrectomy and complete cystectomy with urostomy), chronic kidney disease stage 4 with a baseline creatinine around 2.7-3.0.  Following pacemaker implantation it became apparent that he actually has a very high burden of atrial fibrillation at around 30%.   He was feeling pretty good until the beginning of the year.  He had rapid deterioration and stamina and development of dyspnea over the next few weeks and was found to have atrial flutter with rapid ventricular response.  He was unaware of palpitations.  Overdrive pacing did not work.  He underwent cardioversion on 03/03/2022 with successful return to sinus rhythm, followed by early recurrence of atrial fibrillation within a few days.  He was hospitalized for atrial fibrillation with rapid ventricular response and he underwent repeat cardioversion on 02/16 for atrial flutter with 2: 1 AV conduction and rate related left bundle branch block.  Atrial fibrillation recurred when he was switched to oral amiodarone, converted back to AV paced rhythm when IV amiodarone was restarted.  He was discharged from the hospital on 03/22/2022.  Echocardiography showed a drop in LVEF to 20-25%, significant septal/lateral dyssynchrony,  normal function of the TAVR.  His device was reprogrammed for purposeful ventricular pacing since he has a left bundle branch block lead, with narrower QRS to ventricular pacing than the native LBBB.  He has been taking Eliquis anticoagulation without bleeding problems.  Creatinine remained stable around 2.4-2.6 throughout the hospitalization.  He was very weak when he returned home but has improved a lot over the just last for 5 days.  His son, Jordan Oconnell, who accompanies him today states that he has noticed substantial improvement.  He has been walking to and from the mailbox without difficulty.  He has gained about 3 pounds in the last 24 hours, but does not have shortness of breath or edema.  In fact, his weight today is still about 5 pounds less than his hospital discharge weight.  He does not have chest pain.  He has not had orthopnea or PND.  He is never been aware of palpitations with arrhythmia.  He has not had any hematuria.  He had a single mechanical fall for which she was seen in the emergency room on March 1, thankfully without serious injury or bleeding points is negative head and neck CT).  Coronary angiography performed before his TAVR in September 2022 showed a 90% stenosis in the first diagonal artery, otherwise only moderate scattered stenoses.  He has a moderate aortic root aneurysm measured at 46 mm in maximum diameter.  He has extensive (>50%) urothelial carcinoma in the solitary right kidney, but appears to have no progression of disease while on pembrolizumab (next infusion March 20) and no metastatic disease.  He has been on Eliquis for an uninterrupted  period of about 2 months now and has not had any recurrent hematuria.  Presenting rhythm today was AV sequentially paced.  After reducing the lower rate limit from 70 to 60 bpm, the rhythm is sinus (atrial sensed, ventricular paced).  Tried extending the AV delay to maximum allowed with the current PVARP and max tracking rate and he  still had 100% ventricular pacing.  Pacemaker interrogation shows no evidence of any atrial arrhythmias since February 24, and when he was still in the hospital.  The heart rate histogram is very blunted.  He has 80% atrial paced, 100% ventricular paced rhythm with the current settings.  There has been no episodes of high ventricular rate.  Lead parameters remain good.  Followed at [facility] by his urologist, Dr. [name] - now Dr. [name], most recent evaluation in June 2022 with no evidence of cancer recurrence by imaging studies since his left nephrectomy in June 2018.  He has a right lower quadrant ileal conduit and urostomy.  His creatinine has been stable at 2.8-3.0 over the last couple of years, slightly better at 2.5 at his last visit with Dr. [name] in June 2022.  He has chronic hyperchloremic metabolic acidosis treated with sodium bicarbonate tablets.  His nephrologist is Dr. [name], but as far as I can tell his last visit with him was in June 2019.  His oncologist is Dr. [name].     Past Medical History:  Diagnosis Date   Bladder cancer ([place])    BPH (benign prostatic hyperplasia)    CKD (chronic kidney disease), stage IV (HCC)    Colon polyps    Diverticulosis    GERD (gastroesophageal reflux disease)    Hyperlipidemia    pt denies   Hypertension    PAF (paroxysmal atrial fibrillation) ([place])    on Flecanide and diltiazem. No OAC given recurrent hematuria   Renal cell carcinoma ([place]) 2008   left   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 Burt Knack and Dr. Cyndia Bent   Severe aortic stenosis 10/05/2020    Past Surgical History:  Procedure Laterality Date   APPENDECTOMY     BLADDER SURGERY  2008   transurethral resection/resection of prostatic urethra   BOWEL RESECTION     CARDIOVERSION N/A 03/03/2022   Procedure: CARDIOVERSION;  Surgeon: Buford Dresser, MD;  Location: [facility] ENDOSCOPY;   Service: Cardiovascular;  Laterality: N/A;   CARDIOVERSION N/A 03/12/2022   Procedure: CARDIOVERSION;  Surgeon: Berniece Salines, DO;  Location: [facility]OSCOPY;  Service: Cardiovascular;  Laterality: N/A;   ESOPHAGOGASTRODUODENOSCOPY N/A 07/24/2013   Procedure: ESOPHAGOGASTRODUODENOSCOPY (EGD);  Surgeon: [name], MD;  Location: [name];  Service: Endoscopy;  Laterality: N/A;   INTRAOPERATIVE TRANSTHORACIC ECHOCARDIOGRAM N/A 01/27/2021   Procedure: INTRAOPERATIVE TRANSTHORACIC ECHOCARDIOGRAM;  Surgeon: Sherren Mocha, MD;  Location: [facility];  Service: Open Heart Surgery;  Laterality: N/A;   laparoscopic surgery  2012   laser   (?)   NEPHRECTOMY  2007   partial, left   NEPHROSTOMY  2011   stent   NM MYOCAR PERF WALL MOTION  09/08/2010   Normal   PACEMAKER IMPLANT N/A 02/12/2021   Procedure: PACEMAKER IMPLANT;  Surgeon: Vickie Epley, MD;  Location: [place];  Service: Cardiovascular;  Laterality: N/A;   RIGHT HEART CATH AND CORONARY ANGIOGRAPHY N/A 10/06/2020   Procedure: RIGHT HEART CATH AND CORONARY ANGIOGRAPHY;  Surgeon: Sherren Mocha, MD;  Location: [place] [facility];  Service: Cardiovascular;  Laterality: N/A;   ROBOT ASSISTED LAPAROSCOPIC COMPLETE CYSTECT ILEAL CONDUIT     TRANSCATHETER AORTIC VALVE REPLACEMENT, TRANSFEMORAL Right 01/27/2021   Procedure: TRANSCATHETER AORTIC VALVE REPLACEMENT, TRANSFEMORAL;  Surgeon: Sherren Mocha, MD;  Location: [facility];  Service: Open Heart Surgery;  Laterality: Right;   US ECHOCARDIOGRAPHY  0000000   mild diastolic dysfunction,mild dilated LA,mild MR,AI,mildly dilated aortic root    Current Medications: Outpatient Medications Prior to Visit  Medication Sig Dispense Refill   ALPRAZolam (XANAX) 0.25 MG tablet Take 0.25 mg by mouth at bedtime.     amiodarone (PACERONE) 200 MG tablet Take 2 tablets twice daily for 7 days (until 03/28/22), then starting on 03/29/22 continue with 1 tablet twice daily for 4 weeks  until 04/25/22, then starting on 04/26/22 take one tablet daily. 90 tablet 0   carvedilol (COREG) 6.25 MG tablet Take 1 tablet (6.25 mg total) by mouth 2 (two) times daily with a meal. 60 tablet 0   ELIQUIS 2.5 MG TABS tablet Take 2.5 mg by mouth 2 (two) times daily.     ferrous sulfate 325 (65 FE) MG EC tablet Take 1 tablet (325 mg total) by mouth in the morning and at bedtime. 180 tablet 3   latanoprost (XALATAN) 0.005 % ophthalmic solution Place 1 drop into both eyes at bedtime.     levothyroxine (SYNTHROID) 50 MCG tablet Take 50 mcg by mouth at bedtime.     mirtazapine (REMERON) 15 MG tablet Take 7.5 mg by mouth at bedtime.     sodium bicarbonate 650 MG tablet Take 1,300 mg by mouth 2 (two) times daily.     azithromycin (ZITHROMAX) 500 MG tablet Take 1 tablet by mouth 1 hour before dental procedures and cleanings (Patient not taking: Reported on 04/02/2022) 12 tablet 6   Pembrolizumab (KEYTRUDA IV) Inject into the vein every 6 (six) weeks. Infusion (Patient not taking: Reported on 04/02/2022)     torsemide (DEMADEX) 20 MG tablet Take 1 tablet (20 mg total) by mouth daily as needed (weight gain 2 to 3 lbs in 24 hrs or 5 lbs in 7 days.). (Patient not taking: Reported on 04/02/2022) 30 tablet 0   No facility-administered medications prior to visit.     Allergies:   Amoxicillin, Doxycycline, and Flomax [tamsulosin hcl]   Social History   Socioeconomic History   Marital status: Divorced    Spouse name: Not on file   Number of children: 2   Years of education: Not on file   Highest education level: Not on file  Occupational History   Occupation: Pharmacist, hospital  Tobacco Use   Smoking status: Never   Smokeless tobacco: Never  Vaping Use   Vaping Use: Never used  Substance and Sexual Activity   Alcohol use: Yes    Comment: occasionally   Drug use: No   Sexual activity: Not on file  Other Topics Concern   Not on file  Social History Narrative   Not on file   Social Determinants of Health    Financial Resource Strain: Not on file  Food Insecurity: No Food Insecurity (03/13/2022)   Hunger Vital Sign    Worried About Running Out of Food in the Last Year: Never true    Ran Out of Food in the Last Year: Never true  Transportation Needs: No Transportation Needs (03/13/2022)   PRAPARE - Hydrologist (Medical): No    Lack of Transportation (Non-Medical):  No  Physical Activity: Not on file  Stress: Not on file  Social Connections: Not on file     Family History:  The patient's family history includes Breast cancer in his daughter; Colon cancer in his brother, father, and mother; Crohn's disease in his son; Ovarian cancer in his mother.   ROS:   Please see the history of present illness.     All other systems are reviewed and are negative.  PHYSICAL EXAM:   VS:  BP 107/64 (BP Location: Left Arm, Patient Position: Sitting, Cuff Size: Normal)   Pulse 70   Ht '6\' 1"'$  (1.854 m)   Wt 173 lb 6.4 oz (78.7 kg)   SpO2 96%   BMI 22.88 kg/m       General: Alert, oriented x3, no distress, healthy left subclavian pacemaker site.  Appears stronger and less anxious compared to our previous appointment Head: no evidence of trauma, PERRL, EOMI, no exophtalmos or lid lag, no myxedema, no xanthelasma; normal ears, nose and oropharynx Neck: normal jugular venous pulsations and no hepatojugular reflux; brisk carotid pulses without delay and no carotid bruits Chest: clear to auscultation, no signs of consolidation by percussion or palpation, normal fremitus, symmetrical and full respiratory excursions Cardiovascular: normal position and quality of the apical impulse, regular rhythm, normal first and second heart sounds, no murmurs, rubs or gallops Abdomen: no tenderness or distention, no masses by palpation, no abnormal pulsatility or arterial bruits, normal bowel sounds, no hepatosplenomegaly.  Posterior urostomy present Extremities: no clubbing, cyanosis or edema; 2+  radial, ulnar and brachial pulses bilaterally; 2+ right femoral, posterior tibial and dorsalis pedis pulses; 2+ left femoral, posterior tibial and dorsalis pedis pulses; no subclavian or femoral bruits Neurological: grossly nonfocal Psych: Normal mood and affect   Wt Readings from Last 3 Encounters:  04/02/22 173 lb 6.4 oz (78.7 kg)  03/26/22 177 lb 14.6 oz (80.7 kg)  03/22/22 177 lb 14.6 oz (80.7 kg)      Studies/Labs Reviewed:   EKG:  EKG is ordered today and shows a paced-V paced rhythm.  After reducing the pacemaker the lower rate limit to 60 bpm and the rhythm is atrial sensed (sinus), ventricular paced.    Holter tracing from May 2023 showed LVH and left anterior fascicular block with a QRS duration of only 100 ms.  Echocardiogram 02/19/2022   1. Systolic dysfunction is new compared with the echo 01/2021.  2. Left ventricular ejection fraction, by estimation, is 20 to 25%. The  left ventricle has severely decreased function. The left ventricle  demonstrates regional wall motion abnormalities (see scoring  diagram/findings for description). There is mild  concentric left ventricular hypertrophy. Left ventricular diastolic  function could not be evaluated.   3. Right ventricular systolic function is mildly reduced. The right  ventricular size is normal. There is normal pulmonary artery systolic  pressure.   4. Left atrial size was severely dilated.   5. The mitral valve is normal in structure. Mild mitral valve  regurgitation. No evidence of mitral stenosis.   6. The aortic valve has been repaired/replaced. Aortic valve  regurgitation is not visualized. No aortic stenosis is present. There is a  29 mm Sapien prosthetic (TAVR) valve present in the aortic position.  Aortic valve mean gradient measures 5.8 mmHg.  Aortic valve Vmax measures 1.58 m/s.   7. Aortic dilatation noted. There is moderate dilatation of the ascending  aorta, measuring 43 mm.   8. The inferior vena cava  is normal in  size with greater than 50%  respiratory variability, suggesting right atrial pressure of 3 mmHg.   AV Vmax:           157.60 cm/s  AV Peak Grad:      9.9 mmHg  AV Mean Grad:      5.8 mmHg  LVOT/AV VTI ratio: 0.30      Cardiac catheterization September 2022   Ramus lesion is 60% stenosed.   1st Diag lesion is 90% stenosed.   2nd Diag lesion is 50% stenosed.   Prox LAD to Mid LAD lesion is 30% stenosed.   Mid Cx lesion is 30% stenosed.   1.  Nonobstructive coronary artery disease with separate LAD and left circumflex ostia, patent LAD with mild nonobstructive plaquing, severe ostial diagonal stenoses, patent left circumflex with moderate intermediate branch stenosis and no other significant stenoses, patent nondominant RCA 2.  Known severe aortic stenosis with heavy calcification and restriction of the aortic valve leaflets seen on plain fluoroscopy 3.  Normal right heart hemodynamics with preserved cardiac output, normal pulmonary pressures, and normal LVEDP  CT chest/aorta October 2022 1. Tricuspid aortic valve with acquired fusion of the RCC/LCC. Bulky calcification noted at the commissure between the RCC/LCC. 2. Severe aortic stenosis (calcium score 6064). 3. Annular measurements appropriate for 29 mm S3 TAVR (615 mm2). 4. Trivial annular calcification. 5. Sufficient coronary to annulus distance. 6. Optimal Fluoroscopic Angle for Delivery: LAO 17 CAU 14 7. 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   1. Vascular findings and measurements pertinent to potential TAVR procedure, as detailed above. 2. Severe thickening and calcification of the aortic valve, compatible with reported clinical history of severe aortic stenosis. 3. 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 Urologic consultation is  recommended for further clinical evaluation. 4. Aortic atherosclerosis, in addition to two vessel coronary artery disease. 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 5. Mild cardiomegaly. 6. Cholelithiasis without evidence of acute cholecystitis at this time. 7. Status post left radical nephrectomy. 8. Large ventral hernia containing multiple loops of small bowel and a portion of the cecum. 9. Additional incidental findings, as above.  CT [facility]pelvis [facility] 07/10/2021  1. Persistent nodular soft tissue thickening poorly evaluated on this  noncontrast exam within the left extrarenal pelvis which is concerning for  malignancy. There is slightly increased stranding about the extrarenal  pelvis which can be seen in the setting of infection. Correlate with  urinalysis.  2. No evidence of distant metastatic disease.    Recent Labs: January 27, 2018 creatinine 2.9, Hemoglobin 14.2 Total cholesterol 153, HDL 38, LDL 94, triglycerides 103  02/12/2020 Cholesterol 157, HDL 38, LDL 102, triglycerides 84 Hemoglobin A1c 5.5%, potassium 4.7, normal liver function tests, TSH 0.840  07/09/2020 ([facility]) Sodium 139, testing 4.4, chloride 113, CO2 19, creatinine 2.5, BUN 54, normal liver function tests, hemoglobin 15.1     Latest Ref Rng & Units 03/26/2022    8:19 PM 03/22/2022    3:35 AM 03/21/2022    6:35 AM  BMP  Glucose 70 - 99 mg/dL 88  90  116   BUN 8 - 23 mg/dL 66  41  39   Creatinine 0.61 - 1.24 mg/dL 3.18  2.62  2.51   Sodium 135 - 145  mmol/L 138  139  139   Potassium 3.5 - 5.1 mmol/L 4.6  4.3  4.4   Chloride 98 - 111 mmol/L 105  110  110   CO2 22 - 32 mmol/L '21  19  18   '$ Calcium 8.9 - 10.3 mg/dL 9.5  8.8  8.8    01 10 2023 Hemoglobin 13.6  Lipid Panel  No results found for: "CHOL", "TRIG", "HDL", "CHOLHDL", "VLDL", "LDLCALC", "LDLDIRECT", "LABVLDL"   01 16 2023 Cholesterol 144, HDL 35, LDL 93,  triglycerides 78 Hemoglobin A1c 5.2% Potassium 5.1, ALT 20, TSH 1.73   Additional studies/ records that were reviewed today include:  Notes from follow-up visit with Dr. [name], Rob Hickman from June 2022   ASSESSMENT:    1. Atypical atrial flutter ([place])   2. Acute systolic heart failure ([place])   3. S/P TAVR (transcatheter aortic valve replacement)        PLAN:  In order of problems listed above:  CHF: Clinically he appears euvolemic, even though supposedly he gained 3 pounds overnight.  He is not currently taking any diuretics.  During his several weeks of heart failure he had very poor appetite and lost substantial weight, as much as 30 pounds.  Asked him to call me if his weight exceeds 180 pounds.  It is difficult to say what his dry weight is at this point.   LBBB: A year ago he had a fairly narrow QRS complex with left anterior fascicular block.  Late in 2023 he developed a very broad LBBB (180 ms), but did not have heart failure until he developed tachycardia.  I do not think that his wide QRS is simply rate related, since it has been present intermittently before even at slower heart rates.  His paced QRS (left bundle branch lead is narrower at 158 ms, so we have reprogrammed his device for purposeful ventricular pacing that hopefully will provide better synchrony.  Even with prolongation of the AV delay today to the maximum allowed for his upper tracking rate, he still had ventricular pacing.  I think we will continue with that strategy going forward, will discuss with EP. AFlutter: Repeated early recurrence following cardioversions, now fully loaded on amiodarone.  Not respond to overdrive pacing.  Suspect that this is a focal possibly left atrial flutter, rather than typical counterclockwise right atrial flutter, so it may not be amenable to ablation.  Flecainide was stopped after cath showed evidence of CAD.  Not a candidate for Tikosyn due to CKD.    Amiodarone: So far without any  notable side effects discussed the need for routine thyroid and liver function monitoring, photosensitivity, unlikely but serious possibility of lung toxicity, drug interactions, etc. AS s/p TAVR: Stable gradients. Reinforced the need for endocarditis prophylaxis. Anticoagulation: CHA2DS2-VASc score 4 for age and hypertension, heart failure.  Eliquis dose is adjusted for age and renal dysfunction.  He has had recurrent problems with hematuria, thankfully none in the last couple of months.  He has been back on Eliquis 2.5 mg twice daily without bleeding events since 01/28/2022.  Consider Watchman device implantation. HTN: controlled CKD4: Due to neprhectomy (total L, partial R). Prevents use of usual guideline directed medical therapy for heart failure.  Stable following iodinated contrast based cath and CTA last fall and TAVR this January. He does have chronic metabolic acidosis and takes sodium bicarbonate supplements. Asc Ao Aneurysm: not a candidate for elective surgery. Routine follow up not indicated. Widespread urothelial cancer , his new Urologist at  [facility] is Dr. [name].  Good report at his follow-up MRI in June 2022 , but CT preTAVR raised concern for recurrent disease in right renal pelvis, no progression seen on CT without contrast in June 2023.  Note 04/13/2021 Right antegrade nephroscopy: - Diffuse (>50%) disease of the right renal pelvis: referred to [facility] med onc to consider pembrolizumab - Not resected or ablated. Papillary urothelial carcinoma, high grade, non-invasive.  Muscularis propria is not represented.    Shared Decision Making/Informed Consent The risks (stroke, cardiac arrhythmias rarely resulting in the need for a temporary or permanent pacemaker, skin irritation or burns and complications associated with conscious sedation including aspiration, arrhythmia, respiratory failure and death), benefits (restoration of normal sinus rhythm) and alternatives of a direct current cardioversion  were explained in detail to Mr. Mccoin and he agrees to proceed.     Medication Adjustments/Labs and Tests Ordered: Current medicines are reviewed at length with the patient today.  Concerns regarding medicines are outlined above.  Medication changes, Labs and Tests ordered today are listed in the Patient Instructions below. Patient Instructions  Medication Instructions:  No Changes In Medications at this time.  *If you need a refill on your cardiac medications before your next appointment, please call your pharmacy*  Lab Work: None Ordered At This Time.  If you have labs (blood work) drawn today and your tests are completely normal, you will receive your results only by: [place] (if you have MyChart) OR A paper copy in the mail If you have any lab test that is abnormal or we need to change your treatment, we will call you to review the results.  Testing/Procedures: Your physician has requested that you have an echocardiogram AFTER MAY 1st and BEFORE MAY 20th. Echocardiography is a painless test that uses sound waves to create images of your heart. It provides your doctor with information about the size and shape of your heart and how well your heart's chambers and valves are working. You may receive an ultrasound enhancing agent through an IV if needed to better visualize your heart during the echo.This procedure takes approximately one hour. There are no restrictions for this procedure. This will take place at the [address].   Follow-Up: At [facility], you and your health needs are our priority.  As part of our continuing mission to provide you with exceptional heart care, we have created designated Provider Care Teams.  These Care Teams include your primary Cardiologist (physician) and Advanced Practice Providers (APPs -  Physician Assistants and Nurse Practitioners) who all work together to provide you with the care you need, when you need it.  Your  next appointment:   MAY 20th AT 2:40PM  Provider:   Sanda Klein, MD      Signed, Sanda Klein, MD  04/02/2022 12:52 PM    [address], [place], [place]  [address] Phone: [phone]; Fax: [phone]

## 2022-04-02 NOTE — Patient Instructions (Signed)
Medication Instructions:  No Changes In Medications at this time.  *If you need a refill on your cardiac medications before your next appointment, please call your pharmacy*  Lab Work: None Ordered At This Time.  If you have labs (blood work) drawn today and your tests are completely normal, you will receive your results only by: [place] (if you have MyChart) OR A paper copy in the mail If you have any lab test that is abnormal or we need to change your treatment, we will call you to review the results.  Testing/Procedures: Your physician has requested that you have an echocardiogram AFTER MAY 1st and BEFORE MAY 20th. Echocardiography is a painless test that uses sound waves to create images of your heart. It provides your doctor with information about the size and shape of your heart and how well your heart's chambers and valves are working. You may receive an ultrasound enhancing agent through an IV if needed to better visualize your heart during the echo.This procedure takes approximately one hour. There are no restrictions for this procedure. This will take place at the [address], [address].   Follow-Up: At [facility], you and your health needs are our priority.  As part of our continuing mission to provide you with exceptional heart care, we have created designated Provider Care Teams.  These Care Teams include your primary Cardiologist (physician) and Advanced Practice Providers (APPs -  Physician Assistants and Nurse Practitioners) who all work together to provide you with the care you need, when you need it.  Your next appointment:   MAY 20th AT 2:40PM  Provider:   Sanda Klein, MD

## 2022-04-05 ENCOUNTER — Encounter (HOSPITAL_COMMUNITY): Payer: Medicare PPO

## 2022-04-05 ENCOUNTER — Telehealth: Payer: Self-pay

## 2022-04-05 DIAGNOSIS — N400 Enlarged prostate without lower urinary tract symptoms: Secondary | ICD-10-CM | POA: Diagnosis not present

## 2022-04-05 DIAGNOSIS — E039 Hypothyroidism, unspecified: Secondary | ICD-10-CM | POA: Diagnosis not present

## 2022-04-05 DIAGNOSIS — I495 Sick sinus syndrome: Secondary | ICD-10-CM | POA: Diagnosis not present

## 2022-04-05 DIAGNOSIS — I480 Paroxysmal atrial fibrillation: Secondary | ICD-10-CM | POA: Diagnosis not present

## 2022-04-05 DIAGNOSIS — I130 Hypertensive heart and chronic kidney disease with heart failure and stage 1 through stage 4 chronic kidney disease, or unspecified chronic kidney disease: Secondary | ICD-10-CM | POA: Diagnosis not present

## 2022-04-05 DIAGNOSIS — K5790 Diverticulosis of intestine, part unspecified, without perforation or abscess without bleeding: Secondary | ICD-10-CM | POA: Diagnosis not present

## 2022-04-05 DIAGNOSIS — N184 Chronic kidney disease, stage 4 (severe): Secondary | ICD-10-CM | POA: Diagnosis not present

## 2022-04-05 DIAGNOSIS — I5023 Acute on chronic systolic (congestive) heart failure: Secondary | ICD-10-CM | POA: Diagnosis not present

## 2022-04-05 DIAGNOSIS — C641 Malignant neoplasm of right kidney, except renal pelvis: Secondary | ICD-10-CM | POA: Diagnosis not present

## 2022-04-05 NOTE — Telephone Encounter (Signed)
Per Dr. Sallyanne Kuster, scheduled the patient for [facility] consult with Dr. [name] 04/06/2022. He was grateful for call and agreed with plan.

## 2022-04-06 ENCOUNTER — Telehealth: Payer: Self-pay

## 2022-04-06 ENCOUNTER — Encounter: Payer: Self-pay | Admitting: Cardiology

## 2022-04-06 ENCOUNTER — Ambulatory Visit: Payer: Medicare PPO | Attending: Cardiology | Admitting: Cardiology

## 2022-04-06 VITALS — BP 122/74 | HR 69 | Ht 73.0 in | Wt 175.2 lb

## 2022-04-06 DIAGNOSIS — I484 Atypical atrial flutter: Secondary | ICD-10-CM | POA: Diagnosis not present

## 2022-04-06 DIAGNOSIS — I447 Left bundle-branch block, unspecified: Secondary | ICD-10-CM

## 2022-04-06 DIAGNOSIS — Z950 Presence of cardiac pacemaker: Secondary | ICD-10-CM

## 2022-04-06 DIAGNOSIS — I5042 Chronic combined systolic (congestive) and diastolic (congestive) heart failure: Secondary | ICD-10-CM | POA: Diagnosis not present

## 2022-04-06 DIAGNOSIS — Z952 Presence of prosthetic heart valve: Secondary | ICD-10-CM | POA: Diagnosis not present

## 2022-04-06 DIAGNOSIS — N184 Chronic kidney disease, stage 4 (severe): Secondary | ICD-10-CM | POA: Diagnosis not present

## 2022-04-06 LAB — CUP PACEART INCLINIC DEVICE CHECK
Battery Remaining Longevity: 120 mo
Brady Statistic RA Percent Paced: 69 %
Brady Statistic RV Percent Paced: 98 %
Date Time Interrogation Session: 20240312155515
Implantable Lead Connection Status: 753985
Implantable Lead Connection Status: 753985
Implantable Lead Implant Date: 20230123
Implantable Lead Implant Date: 20230123
Implantable Lead Location: 753859
Implantable Lead Location: 753860
Implantable Lead Model: 7841
Implantable Lead Model: 7842
Implantable Lead Serial Number: 1101059
Implantable Lead Serial Number: 1169597
Implantable Pulse Generator Implant Date: 20230123
Lead Channel Impedance Value: 643 Ohm
Lead Channel Impedance Value: 720 Ohm
Lead Channel Pacing Threshold Amplitude: 1.1 V
Lead Channel Pacing Threshold Amplitude: 1.2 V
Lead Channel Pacing Threshold Pulse Width: 0.4 ms
Lead Channel Pacing Threshold Pulse Width: 0.4 ms
Lead Channel Sensing Intrinsic Amplitude: 10 mV
Lead Channel Sensing Intrinsic Amplitude: 3.1 mV
Pulse Gen Serial Number: 561085

## 2022-04-06 NOTE — Progress Notes (Signed)
Electrophysiology Office Follow up Visit Note:    Date:  04/06/2022   ID:  Jordan Oconnell, DOB [birth year], MRN [id number]  PCP:  Burnard Bunting, MD  [facility] Cardiologist:  Sanda Klein, MD  [facility] Electrophysiologist:  Vickie Epley, MD    Interval History:    Jordan Oconnell is a 82 y.o. male who presents for a follow up visit.   I last saw the patient October 07, 2021 for his atrial fibrillation.  He is on amiodarone.  He has a history of severe aortic stenosis post TAVR and has a permanent pacemaker for complete heart block.  He last saw Dr. Sallyanne Kuster April 02, 2022.  He has urothelial cancer, CKD 4 with a creatinine of 2.7-3.0.  He is felt worse recently with the development of atrial flutter and rapid ventricular rates.  When he saw Dr. Sallyanne Kuster in clinic he had been feeling better for a few days.  He reported no further hematuria.  He is with his son today in clinic.  He looks remarkably better since I last saw him in the hospital.  He tells me that his strength continues to improve.  He is working on his nutrition.  He is working on improving his exercise capacity.     Past medical, surgical, social and family history were reviewed.  ROS:   Please see the history of present illness.    All other systems reviewed and are negative.  EKGs/Labs/Other Studies Reviewed:    The following studies were reviewed today:  April 06, 2022 in clinic device interrogation personally reviewed Battery longevity 10 years 2% A-fib burden Reprogrammed to A-fib alerts for a 2-hour duration over a 24-hour period Lead parameter stable  EKG:  The ekg ordered today demonstrates atrial sensed, ventricular paced rhythm.  QRS duration is 160 ms   Physical Exam:    VS:  BP 122/74   Pulse 69   Ht '6\' 1"'$  (1.854 m)   Wt 175 lb 3.2 oz (79.5 kg)   SpO2 94%   BMI 23.11 kg/m     Wt Readings from Last 3 Encounters:  04/06/22 175 lb 3.2 oz (79.5 kg)  04/02/22 173 lb 6.4  oz (78.7 kg)  03/26/22 177 lb 14.6 oz (80.7 kg)     GEN:  Well nourished, well developed in no acute distress CARDIAC: RRR, no murmurs, rubs, gallops.  CIED pocket well-healed RESPIRATORY:  Clear to auscultation without rales, wheezing or rhonchi       ASSESSMENT:    1. Chronic combined systolic and diastolic CHF (congestive heart failure) ([place])   2. LBBB (left bundle branch block)   3. S/P TAVR (transcatheter aortic valve replacement)   4. Pacemaker   5. CKD (chronic kidney disease) stage 4, GFR 15-29 ml/min (HCC)   6. Atypical atrial flutter (HCC)    PLAN:    In order of problems listed above:  #Chronic combined systolic and diastolic heart failure NYHA class III.  Warm and relatively euvolemic today.  Has recently been hospitalized with heart failure symptoms.  Follows with Dr. Sallyanne Kuster. Continue Coreg, torsemide Cannot use Entresto or ACE/ARB due to CKD 4  #Severe aortic stenosis #Status post TAVR Prosthesis functioning appropriately.  #Left bundle branch block His paced QRS is narrow than his intrinsic left bundle so we will force ventricular pacing for now.  I suspect that when he is in atrial fibrillation with rapid ventricular rates, he does not get the benefit of pacing and his QRS widens  leading to more dyssynchrony and LV dysfunction.  #Atrial flutter #High risk med monitoring-amiodarone Maintaining sinus rhythm.  Continue amiodarone 200 mg by mouth once daily Continue Eliquis 2.5 mg by mouth twice daily  I did discuss watchman with the patient during today's visit.  Given his CKD 4 and other medical comorbidities, I do not think proceeding with watchman is in the patient's best interest.      Signed, Lars Mage, MD, [facility], [facility] 04/06/2022 3:44 PM    Electrophysiology [place]

## 2022-04-06 NOTE — Patient Instructions (Signed)
Medication Instructions:  Your physician recommends that you continue on your current medications as directed. Please refer to the Current Medication list given to you today.  *If you need a refill on your cardiac medications before your next appointment, please call your pharmacy*  Follow-Up: At [facility], you and your health needs are our priority.  As part of our continuing mission to provide you with exceptional heart care, we have created designated Provider Care Teams.  These Care Teams include your primary Cardiologist (physician) and Advanced Practice Providers (APPs -  Physician Assistants and Nurse Practitioners) who all work together to provide you with the care you need, when you need it.  Your next appointment:   6 month(s)  Provider:   You will see one of the following Advanced Practice Providers on your designated Care Team:   Renee Ursuy, PA-C Michael "Andy" Tillery, PA-C Suzann Riddle, NP   

## 2022-04-07 DIAGNOSIS — C641 Malignant neoplasm of right kidney, except renal pelvis: Secondary | ICD-10-CM | POA: Diagnosis not present

## 2022-04-07 DIAGNOSIS — N400 Enlarged prostate without lower urinary tract symptoms: Secondary | ICD-10-CM | POA: Diagnosis not present

## 2022-04-07 DIAGNOSIS — N184 Chronic kidney disease, stage 4 (severe): Secondary | ICD-10-CM | POA: Diagnosis not present

## 2022-04-07 DIAGNOSIS — I5023 Acute on chronic systolic (congestive) heart failure: Secondary | ICD-10-CM | POA: Diagnosis not present

## 2022-04-07 DIAGNOSIS — I480 Paroxysmal atrial fibrillation: Secondary | ICD-10-CM | POA: Diagnosis not present

## 2022-04-07 DIAGNOSIS — K5790 Diverticulosis of intestine, part unspecified, without perforation or abscess without bleeding: Secondary | ICD-10-CM | POA: Diagnosis not present

## 2022-04-07 DIAGNOSIS — I495 Sick sinus syndrome: Secondary | ICD-10-CM | POA: Diagnosis not present

## 2022-04-07 DIAGNOSIS — I130 Hypertensive heart and chronic kidney disease with heart failure and stage 1 through stage 4 chronic kidney disease, or unspecified chronic kidney disease: Secondary | ICD-10-CM | POA: Diagnosis not present

## 2022-04-07 DIAGNOSIS — E039 Hypothyroidism, unspecified: Secondary | ICD-10-CM | POA: Diagnosis not present

## 2022-04-07 NOTE — Addendum Note (Signed)
Addended by: Michelle Nasuti on: 04/07/2022 01:32 PM   Modules accepted: Orders

## 2022-04-08 DIAGNOSIS — I5023 Acute on chronic systolic (congestive) heart failure: Secondary | ICD-10-CM | POA: Diagnosis not present

## 2022-04-08 DIAGNOSIS — H43393 Other vitreous opacities, bilateral: Secondary | ICD-10-CM | POA: Diagnosis not present

## 2022-04-08 DIAGNOSIS — N400 Enlarged prostate without lower urinary tract symptoms: Secondary | ICD-10-CM | POA: Diagnosis not present

## 2022-04-08 DIAGNOSIS — D3131 Benign neoplasm of right choroid: Secondary | ICD-10-CM | POA: Diagnosis not present

## 2022-04-08 DIAGNOSIS — K5790 Diverticulosis of intestine, part unspecified, without perforation or abscess without bleeding: Secondary | ICD-10-CM | POA: Diagnosis not present

## 2022-04-08 DIAGNOSIS — H353122 Nonexudative age-related macular degeneration, left eye, intermediate dry stage: Secondary | ICD-10-CM | POA: Diagnosis not present

## 2022-04-08 DIAGNOSIS — H43813 Vitreous degeneration, bilateral: Secondary | ICD-10-CM | POA: Diagnosis not present

## 2022-04-08 DIAGNOSIS — H35033 Hypertensive retinopathy, bilateral: Secondary | ICD-10-CM | POA: Diagnosis not present

## 2022-04-08 DIAGNOSIS — I480 Paroxysmal atrial fibrillation: Secondary | ICD-10-CM | POA: Diagnosis not present

## 2022-04-08 DIAGNOSIS — I495 Sick sinus syndrome: Secondary | ICD-10-CM | POA: Diagnosis not present

## 2022-04-08 DIAGNOSIS — C641 Malignant neoplasm of right kidney, except renal pelvis: Secondary | ICD-10-CM | POA: Diagnosis not present

## 2022-04-08 DIAGNOSIS — E039 Hypothyroidism, unspecified: Secondary | ICD-10-CM | POA: Diagnosis not present

## 2022-04-08 DIAGNOSIS — H353211 Exudative age-related macular degeneration, right eye, with active choroidal neovascularization: Secondary | ICD-10-CM | POA: Diagnosis not present

## 2022-04-08 DIAGNOSIS — I130 Hypertensive heart and chronic kidney disease with heart failure and stage 1 through stage 4 chronic kidney disease, or unspecified chronic kidney disease: Secondary | ICD-10-CM | POA: Diagnosis not present

## 2022-04-08 DIAGNOSIS — N184 Chronic kidney disease, stage 4 (severe): Secondary | ICD-10-CM | POA: Diagnosis not present

## 2022-04-08 DIAGNOSIS — H31092 Other chorioretinal scars, left eye: Secondary | ICD-10-CM | POA: Diagnosis not present

## 2022-04-12 ENCOUNTER — Other Ambulatory Visit (HOSPITAL_COMMUNITY): Payer: Self-pay

## 2022-04-12 DIAGNOSIS — C641 Malignant neoplasm of right kidney, except renal pelvis: Secondary | ICD-10-CM | POA: Diagnosis not present

## 2022-04-12 DIAGNOSIS — I480 Paroxysmal atrial fibrillation: Secondary | ICD-10-CM | POA: Diagnosis not present

## 2022-04-12 DIAGNOSIS — N400 Enlarged prostate without lower urinary tract symptoms: Secondary | ICD-10-CM | POA: Diagnosis not present

## 2022-04-12 DIAGNOSIS — I130 Hypertensive heart and chronic kidney disease with heart failure and stage 1 through stage 4 chronic kidney disease, or unspecified chronic kidney disease: Secondary | ICD-10-CM | POA: Diagnosis not present

## 2022-04-12 DIAGNOSIS — K5790 Diverticulosis of intestine, part unspecified, without perforation or abscess without bleeding: Secondary | ICD-10-CM | POA: Diagnosis not present

## 2022-04-12 DIAGNOSIS — E039 Hypothyroidism, unspecified: Secondary | ICD-10-CM | POA: Diagnosis not present

## 2022-04-12 DIAGNOSIS — N184 Chronic kidney disease, stage 4 (severe): Secondary | ICD-10-CM | POA: Diagnosis not present

## 2022-04-12 DIAGNOSIS — I5023 Acute on chronic systolic (congestive) heart failure: Secondary | ICD-10-CM | POA: Diagnosis not present

## 2022-04-12 DIAGNOSIS — I495 Sick sinus syndrome: Secondary | ICD-10-CM | POA: Diagnosis not present

## 2022-04-14 DIAGNOSIS — C641 Malignant neoplasm of right kidney, except renal pelvis: Secondary | ICD-10-CM | POA: Diagnosis not present

## 2022-04-14 DIAGNOSIS — I5023 Acute on chronic systolic (congestive) heart failure: Secondary | ICD-10-CM | POA: Diagnosis not present

## 2022-04-14 DIAGNOSIS — I480 Paroxysmal atrial fibrillation: Secondary | ICD-10-CM | POA: Diagnosis not present

## 2022-04-14 DIAGNOSIS — I495 Sick sinus syndrome: Secondary | ICD-10-CM | POA: Diagnosis not present

## 2022-04-14 DIAGNOSIS — E039 Hypothyroidism, unspecified: Secondary | ICD-10-CM | POA: Diagnosis not present

## 2022-04-14 DIAGNOSIS — N400 Enlarged prostate without lower urinary tract symptoms: Secondary | ICD-10-CM | POA: Diagnosis not present

## 2022-04-14 DIAGNOSIS — N184 Chronic kidney disease, stage 4 (severe): Secondary | ICD-10-CM | POA: Diagnosis not present

## 2022-04-14 DIAGNOSIS — I130 Hypertensive heart and chronic kidney disease with heart failure and stage 1 through stage 4 chronic kidney disease, or unspecified chronic kidney disease: Secondary | ICD-10-CM | POA: Diagnosis not present

## 2022-04-14 DIAGNOSIS — K5790 Diverticulosis of intestine, part unspecified, without perforation or abscess without bleeding: Secondary | ICD-10-CM | POA: Diagnosis not present

## 2022-04-16 DIAGNOSIS — I480 Paroxysmal atrial fibrillation: Secondary | ICD-10-CM | POA: Diagnosis not present

## 2022-04-16 DIAGNOSIS — N184 Chronic kidney disease, stage 4 (severe): Secondary | ICD-10-CM | POA: Diagnosis not present

## 2022-04-16 DIAGNOSIS — E039 Hypothyroidism, unspecified: Secondary | ICD-10-CM | POA: Diagnosis not present

## 2022-04-16 DIAGNOSIS — N400 Enlarged prostate without lower urinary tract symptoms: Secondary | ICD-10-CM | POA: Diagnosis not present

## 2022-04-16 DIAGNOSIS — C641 Malignant neoplasm of right kidney, except renal pelvis: Secondary | ICD-10-CM | POA: Diagnosis not present

## 2022-04-16 DIAGNOSIS — K5790 Diverticulosis of intestine, part unspecified, without perforation or abscess without bleeding: Secondary | ICD-10-CM | POA: Diagnosis not present

## 2022-04-16 DIAGNOSIS — I5023 Acute on chronic systolic (congestive) heart failure: Secondary | ICD-10-CM | POA: Diagnosis not present

## 2022-04-16 DIAGNOSIS — I495 Sick sinus syndrome: Secondary | ICD-10-CM | POA: Diagnosis not present

## 2022-04-16 DIAGNOSIS — I130 Hypertensive heart and chronic kidney disease with heart failure and stage 1 through stage 4 chronic kidney disease, or unspecified chronic kidney disease: Secondary | ICD-10-CM | POA: Diagnosis not present

## 2022-04-19 DIAGNOSIS — N184 Chronic kidney disease, stage 4 (severe): Secondary | ICD-10-CM | POA: Diagnosis not present

## 2022-04-19 DIAGNOSIS — I5023 Acute on chronic systolic (congestive) heart failure: Secondary | ICD-10-CM | POA: Diagnosis not present

## 2022-04-19 DIAGNOSIS — I495 Sick sinus syndrome: Secondary | ICD-10-CM | POA: Diagnosis not present

## 2022-04-19 DIAGNOSIS — E039 Hypothyroidism, unspecified: Secondary | ICD-10-CM | POA: Diagnosis not present

## 2022-04-19 DIAGNOSIS — I480 Paroxysmal atrial fibrillation: Secondary | ICD-10-CM | POA: Diagnosis not present

## 2022-04-19 DIAGNOSIS — I130 Hypertensive heart and chronic kidney disease with heart failure and stage 1 through stage 4 chronic kidney disease, or unspecified chronic kidney disease: Secondary | ICD-10-CM | POA: Diagnosis not present

## 2022-04-19 DIAGNOSIS — K5790 Diverticulosis of intestine, part unspecified, without perforation or abscess without bleeding: Secondary | ICD-10-CM | POA: Diagnosis not present

## 2022-04-19 DIAGNOSIS — N400 Enlarged prostate without lower urinary tract symptoms: Secondary | ICD-10-CM | POA: Diagnosis not present

## 2022-04-19 DIAGNOSIS — H903 Sensorineural hearing loss, bilateral: Secondary | ICD-10-CM | POA: Diagnosis not present

## 2022-04-19 DIAGNOSIS — C641 Malignant neoplasm of right kidney, except renal pelvis: Secondary | ICD-10-CM | POA: Diagnosis not present

## 2022-04-20 DIAGNOSIS — I480 Paroxysmal atrial fibrillation: Secondary | ICD-10-CM | POA: Diagnosis not present

## 2022-04-20 DIAGNOSIS — N400 Enlarged prostate without lower urinary tract symptoms: Secondary | ICD-10-CM | POA: Diagnosis not present

## 2022-04-20 DIAGNOSIS — I130 Hypertensive heart and chronic kidney disease with heart failure and stage 1 through stage 4 chronic kidney disease, or unspecified chronic kidney disease: Secondary | ICD-10-CM | POA: Diagnosis not present

## 2022-04-20 DIAGNOSIS — C641 Malignant neoplasm of right kidney, except renal pelvis: Secondary | ICD-10-CM | POA: Diagnosis not present

## 2022-04-20 DIAGNOSIS — I495 Sick sinus syndrome: Secondary | ICD-10-CM | POA: Diagnosis not present

## 2022-04-20 DIAGNOSIS — N184 Chronic kidney disease, stage 4 (severe): Secondary | ICD-10-CM | POA: Diagnosis not present

## 2022-04-20 DIAGNOSIS — K5790 Diverticulosis of intestine, part unspecified, without perforation or abscess without bleeding: Secondary | ICD-10-CM | POA: Diagnosis not present

## 2022-04-20 DIAGNOSIS — E039 Hypothyroidism, unspecified: Secondary | ICD-10-CM | POA: Diagnosis not present

## 2022-04-20 DIAGNOSIS — I5023 Acute on chronic systolic (congestive) heart failure: Secondary | ICD-10-CM | POA: Diagnosis not present

## 2022-04-21 DIAGNOSIS — Z952 Presence of prosthetic heart valve: Secondary | ICD-10-CM | POA: Diagnosis not present

## 2022-04-21 DIAGNOSIS — C661 Malignant neoplasm of right ureter: Secondary | ICD-10-CM | POA: Diagnosis not present

## 2022-04-21 DIAGNOSIS — Z950 Presence of cardiac pacemaker: Secondary | ICD-10-CM | POA: Diagnosis not present

## 2022-04-21 DIAGNOSIS — K219 Gastro-esophageal reflux disease without esophagitis: Secondary | ICD-10-CM | POA: Diagnosis not present

## 2022-04-21 DIAGNOSIS — C662 Malignant neoplasm of left ureter: Secondary | ICD-10-CM | POA: Diagnosis not present

## 2022-04-21 DIAGNOSIS — I4891 Unspecified atrial fibrillation: Secondary | ICD-10-CM | POA: Diagnosis not present

## 2022-04-21 DIAGNOSIS — N184 Chronic kidney disease, stage 4 (severe): Secondary | ICD-10-CM | POA: Diagnosis not present

## 2022-04-21 DIAGNOSIS — C678 Malignant neoplasm of overlapping sites of bladder: Secondary | ICD-10-CM | POA: Diagnosis not present

## 2022-04-21 DIAGNOSIS — Z5112 Encounter for antineoplastic immunotherapy: Secondary | ICD-10-CM | POA: Diagnosis not present

## 2022-04-21 DIAGNOSIS — Z8551 Personal history of malignant neoplasm of bladder: Secondary | ICD-10-CM | POA: Diagnosis not present

## 2022-04-21 DIAGNOSIS — I129 Hypertensive chronic kidney disease with stage 1 through stage 4 chronic kidney disease, or unspecified chronic kidney disease: Secondary | ICD-10-CM | POA: Diagnosis not present

## 2022-04-21 DIAGNOSIS — C642 Malignant neoplasm of left kidney, except renal pelvis: Secondary | ICD-10-CM | POA: Diagnosis not present

## 2022-04-22 DIAGNOSIS — I5023 Acute on chronic systolic (congestive) heart failure: Secondary | ICD-10-CM | POA: Diagnosis not present

## 2022-04-22 DIAGNOSIS — E039 Hypothyroidism, unspecified: Secondary | ICD-10-CM | POA: Diagnosis not present

## 2022-04-22 DIAGNOSIS — I130 Hypertensive heart and chronic kidney disease with heart failure and stage 1 through stage 4 chronic kidney disease, or unspecified chronic kidney disease: Secondary | ICD-10-CM | POA: Diagnosis not present

## 2022-04-22 DIAGNOSIS — I495 Sick sinus syndrome: Secondary | ICD-10-CM | POA: Diagnosis not present

## 2022-04-22 DIAGNOSIS — K5790 Diverticulosis of intestine, part unspecified, without perforation or abscess without bleeding: Secondary | ICD-10-CM | POA: Diagnosis not present

## 2022-04-22 DIAGNOSIS — C641 Malignant neoplasm of right kidney, except renal pelvis: Secondary | ICD-10-CM | POA: Diagnosis not present

## 2022-04-22 DIAGNOSIS — N400 Enlarged prostate without lower urinary tract symptoms: Secondary | ICD-10-CM | POA: Diagnosis not present

## 2022-04-22 DIAGNOSIS — I480 Paroxysmal atrial fibrillation: Secondary | ICD-10-CM | POA: Diagnosis not present

## 2022-04-22 DIAGNOSIS — N184 Chronic kidney disease, stage 4 (severe): Secondary | ICD-10-CM | POA: Diagnosis not present

## 2022-04-23 DIAGNOSIS — N400 Enlarged prostate without lower urinary tract symptoms: Secondary | ICD-10-CM | POA: Diagnosis not present

## 2022-04-23 DIAGNOSIS — C641 Malignant neoplasm of right kidney, except renal pelvis: Secondary | ICD-10-CM | POA: Diagnosis not present

## 2022-04-23 DIAGNOSIS — I130 Hypertensive heart and chronic kidney disease with heart failure and stage 1 through stage 4 chronic kidney disease, or unspecified chronic kidney disease: Secondary | ICD-10-CM | POA: Diagnosis not present

## 2022-04-23 DIAGNOSIS — K5790 Diverticulosis of intestine, part unspecified, without perforation or abscess without bleeding: Secondary | ICD-10-CM | POA: Diagnosis not present

## 2022-04-23 DIAGNOSIS — I495 Sick sinus syndrome: Secondary | ICD-10-CM | POA: Diagnosis not present

## 2022-04-23 DIAGNOSIS — N184 Chronic kidney disease, stage 4 (severe): Secondary | ICD-10-CM | POA: Diagnosis not present

## 2022-04-23 DIAGNOSIS — I480 Paroxysmal atrial fibrillation: Secondary | ICD-10-CM | POA: Diagnosis not present

## 2022-04-23 DIAGNOSIS — E039 Hypothyroidism, unspecified: Secondary | ICD-10-CM | POA: Diagnosis not present

## 2022-04-23 DIAGNOSIS — I5023 Acute on chronic systolic (congestive) heart failure: Secondary | ICD-10-CM | POA: Diagnosis not present

## 2022-04-26 DIAGNOSIS — I5023 Acute on chronic systolic (congestive) heart failure: Secondary | ICD-10-CM | POA: Diagnosis not present

## 2022-04-26 DIAGNOSIS — C641 Malignant neoplasm of right kidney, except renal pelvis: Secondary | ICD-10-CM | POA: Diagnosis not present

## 2022-04-26 DIAGNOSIS — K5790 Diverticulosis of intestine, part unspecified, without perforation or abscess without bleeding: Secondary | ICD-10-CM | POA: Diagnosis not present

## 2022-04-26 DIAGNOSIS — N400 Enlarged prostate without lower urinary tract symptoms: Secondary | ICD-10-CM | POA: Diagnosis not present

## 2022-04-26 DIAGNOSIS — I480 Paroxysmal atrial fibrillation: Secondary | ICD-10-CM | POA: Diagnosis not present

## 2022-04-26 DIAGNOSIS — N184 Chronic kidney disease, stage 4 (severe): Secondary | ICD-10-CM | POA: Diagnosis not present

## 2022-04-26 DIAGNOSIS — E039 Hypothyroidism, unspecified: Secondary | ICD-10-CM | POA: Diagnosis not present

## 2022-04-26 DIAGNOSIS — I130 Hypertensive heart and chronic kidney disease with heart failure and stage 1 through stage 4 chronic kidney disease, or unspecified chronic kidney disease: Secondary | ICD-10-CM | POA: Diagnosis not present

## 2022-04-26 DIAGNOSIS — I495 Sick sinus syndrome: Secondary | ICD-10-CM | POA: Diagnosis not present

## 2022-04-29 ENCOUNTER — Telehealth: Payer: Self-pay | Admitting: Cardiovascular Disease

## 2022-04-29 NOTE — Telephone Encounter (Signed)
Patient states he does not have any swelling or shortness of breath. He feels great. Will give [place] a call  when his weight is at 185.

## 2022-04-29 NOTE — Telephone Encounter (Signed)
Patient states Dr. Sallyanne Kuster advised him to call in when his weight is at 180 lbs. She states he feels great, but today he weighs 180.8 lbs so he called to inform Dr. Sallyanne Kuster.

## 2022-05-03 DIAGNOSIS — L812 Freckles: Secondary | ICD-10-CM | POA: Diagnosis not present

## 2022-05-03 DIAGNOSIS — L853 Xerosis cutis: Secondary | ICD-10-CM | POA: Diagnosis not present

## 2022-05-03 DIAGNOSIS — L821 Other seborrheic keratosis: Secondary | ICD-10-CM | POA: Diagnosis not present

## 2022-05-03 DIAGNOSIS — L570 Actinic keratosis: Secondary | ICD-10-CM | POA: Diagnosis not present

## 2022-05-03 DIAGNOSIS — Z85828 Personal history of other malignant neoplasm of skin: Secondary | ICD-10-CM | POA: Diagnosis not present

## 2022-05-03 DIAGNOSIS — D692 Other nonthrombocytopenic purpura: Secondary | ICD-10-CM | POA: Diagnosis not present

## 2022-05-04 DIAGNOSIS — I495 Sick sinus syndrome: Secondary | ICD-10-CM | POA: Diagnosis not present

## 2022-05-04 DIAGNOSIS — N184 Chronic kidney disease, stage 4 (severe): Secondary | ICD-10-CM | POA: Diagnosis not present

## 2022-05-04 DIAGNOSIS — C641 Malignant neoplasm of right kidney, except renal pelvis: Secondary | ICD-10-CM | POA: Diagnosis not present

## 2022-05-04 DIAGNOSIS — I5023 Acute on chronic systolic (congestive) heart failure: Secondary | ICD-10-CM | POA: Diagnosis not present

## 2022-05-04 DIAGNOSIS — E039 Hypothyroidism, unspecified: Secondary | ICD-10-CM | POA: Diagnosis not present

## 2022-05-04 DIAGNOSIS — I480 Paroxysmal atrial fibrillation: Secondary | ICD-10-CM | POA: Diagnosis not present

## 2022-05-04 DIAGNOSIS — K5790 Diverticulosis of intestine, part unspecified, without perforation or abscess without bleeding: Secondary | ICD-10-CM | POA: Diagnosis not present

## 2022-05-04 DIAGNOSIS — I130 Hypertensive heart and chronic kidney disease with heart failure and stage 1 through stage 4 chronic kidney disease, or unspecified chronic kidney disease: Secondary | ICD-10-CM | POA: Diagnosis not present

## 2022-05-04 DIAGNOSIS — N400 Enlarged prostate without lower urinary tract symptoms: Secondary | ICD-10-CM | POA: Diagnosis not present

## 2022-05-05 DIAGNOSIS — I495 Sick sinus syndrome: Secondary | ICD-10-CM | POA: Diagnosis not present

## 2022-05-05 DIAGNOSIS — I130 Hypertensive heart and chronic kidney disease with heart failure and stage 1 through stage 4 chronic kidney disease, or unspecified chronic kidney disease: Secondary | ICD-10-CM | POA: Diagnosis not present

## 2022-05-05 DIAGNOSIS — N400 Enlarged prostate without lower urinary tract symptoms: Secondary | ICD-10-CM | POA: Diagnosis not present

## 2022-05-05 DIAGNOSIS — C641 Malignant neoplasm of right kidney, except renal pelvis: Secondary | ICD-10-CM | POA: Diagnosis not present

## 2022-05-05 DIAGNOSIS — N184 Chronic kidney disease, stage 4 (severe): Secondary | ICD-10-CM | POA: Diagnosis not present

## 2022-05-05 DIAGNOSIS — K5790 Diverticulosis of intestine, part unspecified, without perforation or abscess without bleeding: Secondary | ICD-10-CM | POA: Diagnosis not present

## 2022-05-05 DIAGNOSIS — E039 Hypothyroidism, unspecified: Secondary | ICD-10-CM | POA: Diagnosis not present

## 2022-05-05 DIAGNOSIS — I5023 Acute on chronic systolic (congestive) heart failure: Secondary | ICD-10-CM | POA: Diagnosis not present

## 2022-05-05 DIAGNOSIS — I480 Paroxysmal atrial fibrillation: Secondary | ICD-10-CM | POA: Diagnosis not present

## 2022-05-06 DIAGNOSIS — C642 Malignant neoplasm of left kidney, except renal pelvis: Secondary | ICD-10-CM | POA: Diagnosis not present

## 2022-05-06 DIAGNOSIS — C679 Malignant neoplasm of bladder, unspecified: Secondary | ICD-10-CM | POA: Diagnosis not present

## 2022-05-06 DIAGNOSIS — C662 Malignant neoplasm of left ureter: Secondary | ICD-10-CM | POA: Diagnosis not present

## 2022-05-06 DIAGNOSIS — N184 Chronic kidney disease, stage 4 (severe): Secondary | ICD-10-CM | POA: Diagnosis not present

## 2022-05-06 DIAGNOSIS — Z85528 Personal history of other malignant neoplasm of kidney: Secondary | ICD-10-CM | POA: Diagnosis not present

## 2022-05-06 DIAGNOSIS — I4891 Unspecified atrial fibrillation: Secondary | ICD-10-CM | POA: Diagnosis not present

## 2022-05-06 DIAGNOSIS — K469 Unspecified abdominal hernia without obstruction or gangrene: Secondary | ICD-10-CM | POA: Diagnosis not present

## 2022-05-06 DIAGNOSIS — C661 Malignant neoplasm of right ureter: Secondary | ICD-10-CM | POA: Diagnosis not present

## 2022-05-06 DIAGNOSIS — Z08 Encounter for follow-up examination after completed treatment for malignant neoplasm: Secondary | ICD-10-CM | POA: Diagnosis not present

## 2022-05-06 DIAGNOSIS — Z8554 Personal history of malignant neoplasm of ureter: Secondary | ICD-10-CM | POA: Diagnosis not present

## 2022-05-06 DIAGNOSIS — I129 Hypertensive chronic kidney disease with stage 1 through stage 4 chronic kidney disease, or unspecified chronic kidney disease: Secondary | ICD-10-CM | POA: Diagnosis not present

## 2022-05-06 DIAGNOSIS — C678 Malignant neoplasm of overlapping sites of bladder: Secondary | ICD-10-CM | POA: Diagnosis not present

## 2022-05-06 DIAGNOSIS — R590 Localized enlarged lymph nodes: Secondary | ICD-10-CM | POA: Diagnosis not present

## 2022-05-06 DIAGNOSIS — R918 Other nonspecific abnormal finding of lung field: Secondary | ICD-10-CM | POA: Diagnosis not present

## 2022-05-07 DIAGNOSIS — Z936 Other artificial openings of urinary tract status: Secondary | ICD-10-CM | POA: Diagnosis not present

## 2022-05-12 DIAGNOSIS — I5023 Acute on chronic systolic (congestive) heart failure: Secondary | ICD-10-CM | POA: Diagnosis not present

## 2022-05-12 DIAGNOSIS — K5790 Diverticulosis of intestine, part unspecified, without perforation or abscess without bleeding: Secondary | ICD-10-CM | POA: Diagnosis not present

## 2022-05-12 DIAGNOSIS — N184 Chronic kidney disease, stage 4 (severe): Secondary | ICD-10-CM | POA: Diagnosis not present

## 2022-05-12 DIAGNOSIS — I130 Hypertensive heart and chronic kidney disease with heart failure and stage 1 through stage 4 chronic kidney disease, or unspecified chronic kidney disease: Secondary | ICD-10-CM | POA: Diagnosis not present

## 2022-05-12 DIAGNOSIS — E039 Hypothyroidism, unspecified: Secondary | ICD-10-CM | POA: Diagnosis not present

## 2022-05-12 DIAGNOSIS — N400 Enlarged prostate without lower urinary tract symptoms: Secondary | ICD-10-CM | POA: Diagnosis not present

## 2022-05-12 DIAGNOSIS — C641 Malignant neoplasm of right kidney, except renal pelvis: Secondary | ICD-10-CM | POA: Diagnosis not present

## 2022-05-12 DIAGNOSIS — I495 Sick sinus syndrome: Secondary | ICD-10-CM | POA: Diagnosis not present

## 2022-05-12 DIAGNOSIS — I480 Paroxysmal atrial fibrillation: Secondary | ICD-10-CM | POA: Diagnosis not present

## 2022-05-14 ENCOUNTER — Telehealth: Payer: Self-pay | Admitting: Cardiovascular Disease

## 2022-05-14 MED ORDER — CARVEDILOL 6.25 MG PO TABS: 6.2500 mg | ORAL_TABLET | Freq: Two times a day (BID) | ORAL | 3 refills | Status: DC

## 2022-05-14 NOTE — Telephone Encounter (Signed)
*  STAT* If patient is at the pharmacy, call can be transferred to refill team.   1. Which medications need to be refilled? (please list name of each medication and dose if known)   carvedilol (COREG) 6.25 MG tablet (Expired)   2. Which pharmacy/location (including street and city if local pharmacy) is medication to be sent to?  [facility] [address], [place] - [address]   3. Do they need a 30 day or 90 day supply?   90 day  Patient stated he is almost out of this medication.  Patient wants a call back to confirm refill was sent to pharmacy.

## 2022-05-14 NOTE — Telephone Encounter (Signed)
Pt's medication was sent to pt's pharmacy as requested. Confirmation received.  °

## 2022-05-17 DIAGNOSIS — C641 Malignant neoplasm of right kidney, except renal pelvis: Secondary | ICD-10-CM | POA: Diagnosis not present

## 2022-05-17 DIAGNOSIS — N400 Enlarged prostate without lower urinary tract symptoms: Secondary | ICD-10-CM | POA: Diagnosis not present

## 2022-05-17 DIAGNOSIS — I495 Sick sinus syndrome: Secondary | ICD-10-CM | POA: Diagnosis not present

## 2022-05-17 DIAGNOSIS — I130 Hypertensive heart and chronic kidney disease with heart failure and stage 1 through stage 4 chronic kidney disease, or unspecified chronic kidney disease: Secondary | ICD-10-CM | POA: Diagnosis not present

## 2022-05-17 DIAGNOSIS — I5023 Acute on chronic systolic (congestive) heart failure: Secondary | ICD-10-CM | POA: Diagnosis not present

## 2022-05-17 DIAGNOSIS — E039 Hypothyroidism, unspecified: Secondary | ICD-10-CM | POA: Diagnosis not present

## 2022-05-17 DIAGNOSIS — N184 Chronic kidney disease, stage 4 (severe): Secondary | ICD-10-CM | POA: Diagnosis not present

## 2022-05-17 DIAGNOSIS — K5790 Diverticulosis of intestine, part unspecified, without perforation or abscess without bleeding: Secondary | ICD-10-CM | POA: Diagnosis not present

## 2022-05-17 DIAGNOSIS — I480 Paroxysmal atrial fibrillation: Secondary | ICD-10-CM | POA: Diagnosis not present

## 2022-05-20 DIAGNOSIS — N184 Chronic kidney disease, stage 4 (severe): Secondary | ICD-10-CM | POA: Diagnosis not present

## 2022-05-20 DIAGNOSIS — E039 Hypothyroidism, unspecified: Secondary | ICD-10-CM | POA: Diagnosis not present

## 2022-05-20 DIAGNOSIS — C641 Malignant neoplasm of right kidney, except renal pelvis: Secondary | ICD-10-CM | POA: Diagnosis not present

## 2022-05-20 DIAGNOSIS — I495 Sick sinus syndrome: Secondary | ICD-10-CM | POA: Diagnosis not present

## 2022-05-20 DIAGNOSIS — I130 Hypertensive heart and chronic kidney disease with heart failure and stage 1 through stage 4 chronic kidney disease, or unspecified chronic kidney disease: Secondary | ICD-10-CM | POA: Diagnosis not present

## 2022-05-20 DIAGNOSIS — I480 Paroxysmal atrial fibrillation: Secondary | ICD-10-CM | POA: Diagnosis not present

## 2022-05-20 DIAGNOSIS — I5023 Acute on chronic systolic (congestive) heart failure: Secondary | ICD-10-CM | POA: Diagnosis not present

## 2022-05-20 DIAGNOSIS — N400 Enlarged prostate without lower urinary tract symptoms: Secondary | ICD-10-CM | POA: Diagnosis not present

## 2022-05-20 DIAGNOSIS — K5790 Diverticulosis of intestine, part unspecified, without perforation or abscess without bleeding: Secondary | ICD-10-CM | POA: Diagnosis not present

## 2022-05-25 ENCOUNTER — Ambulatory Visit (INDEPENDENT_AMBULATORY_CARE_PROVIDER_SITE_OTHER): Payer: Medicare PPO

## 2022-05-25 DIAGNOSIS — I447 Left bundle-branch block, unspecified: Secondary | ICD-10-CM | POA: Diagnosis not present

## 2022-05-26 LAB — CUP PACEART REMOTE DEVICE CHECK
Battery Remaining Longevity: 150 mo
Battery Remaining Percentage: 100 %
Brady Statistic RA Percent Paced: 18 %
Brady Statistic RV Percent Paced: 100 %
Date Time Interrogation Session: 20240501113700
Implantable Lead Connection Status: 753985
Implantable Lead Connection Status: 753985
Implantable Lead Implant Date: 20230123
Implantable Lead Implant Date: 20230123
Implantable Lead Location: 753859
Implantable Lead Location: 753860
Implantable Lead Model: 7841
Implantable Lead Model: 7842
Implantable Lead Serial Number: 1101059
Implantable Lead Serial Number: 1169597
Implantable Pulse Generator Implant Date: 20230123
Lead Channel Impedance Value: 701 Ohm
Lead Channel Impedance Value: 718 Ohm
Lead Channel Pacing Threshold Amplitude: 1.2 V
Lead Channel Pacing Threshold Amplitude: 1.3 V
Lead Channel Pacing Threshold Pulse Width: 0.4 ms
Lead Channel Pacing Threshold Pulse Width: 0.4 ms
Lead Channel Setting Pacing Amplitude: 2.5 V
Lead Channel Setting Pacing Amplitude: 2.5 V
Lead Channel Setting Pacing Pulse Width: 0.4 ms
Lead Channel Setting Sensing Sensitivity: 2.5 mV
Pulse Gen Serial Number: 561085
Zone Setting Status: 755011

## 2022-06-02 ENCOUNTER — Ambulatory Visit (HOSPITAL_COMMUNITY): Payer: Medicare PPO | Attending: Cardiology

## 2022-06-02 DIAGNOSIS — Z952 Presence of prosthetic heart valve: Secondary | ICD-10-CM | POA: Diagnosis not present

## 2022-06-02 DIAGNOSIS — I5042 Chronic combined systolic (congestive) and diastolic (congestive) heart failure: Secondary | ICD-10-CM | POA: Diagnosis not present

## 2022-06-02 DIAGNOSIS — I484 Atypical atrial flutter: Secondary | ICD-10-CM | POA: Diagnosis not present

## 2022-06-02 LAB — ECHOCARDIOGRAM COMPLETE
AR max vel: 2.74 cm2
AV Area VTI: 2.68 cm2
AV Area mean vel: 2.57 cm2
AV Mean grad: 8 mmHg
AV Peak grad: 14.3 mmHg
Ao pk vel: 1.89 m/s
Area-P 1/2: 2.99 cm2
S' Lateral: 3.7 cm

## 2022-06-11 DIAGNOSIS — H903 Sensorineural hearing loss, bilateral: Secondary | ICD-10-CM | POA: Diagnosis not present

## 2022-06-14 ENCOUNTER — Encounter: Payer: Self-pay | Admitting: Cardiovascular Disease

## 2022-06-14 ENCOUNTER — Ambulatory Visit: Payer: Medicare PPO | Attending: Cardiovascular Disease | Admitting: Cardiovascular Disease

## 2022-06-14 VITALS — BP 129/81 | HR 64 | Ht 73.0 in | Wt 178.4 lb

## 2022-06-14 DIAGNOSIS — I484 Atypical atrial flutter: Secondary | ICD-10-CM | POA: Diagnosis not present

## 2022-06-14 DIAGNOSIS — Z5181 Encounter for therapeutic drug level monitoring: Secondary | ICD-10-CM | POA: Diagnosis not present

## 2022-06-14 DIAGNOSIS — D6869 Other thrombophilia: Secondary | ICD-10-CM

## 2022-06-14 DIAGNOSIS — I447 Left bundle-branch block, unspecified: Secondary | ICD-10-CM | POA: Diagnosis not present

## 2022-06-14 DIAGNOSIS — I5042 Chronic combined systolic (congestive) and diastolic (congestive) heart failure: Secondary | ICD-10-CM

## 2022-06-14 DIAGNOSIS — Z952 Presence of prosthetic heart valve: Secondary | ICD-10-CM | POA: Diagnosis not present

## 2022-06-14 DIAGNOSIS — Z950 Presence of cardiac pacemaker: Secondary | ICD-10-CM | POA: Diagnosis not present

## 2022-06-14 DIAGNOSIS — Z79899 Other long term (current) drug therapy: Secondary | ICD-10-CM

## 2022-06-14 DIAGNOSIS — C689 Malignant neoplasm of urinary organ, unspecified: Secondary | ICD-10-CM

## 2022-06-14 DIAGNOSIS — N184 Chronic kidney disease, stage 4 (severe): Secondary | ICD-10-CM

## 2022-06-14 DIAGNOSIS — I7121 Aneurysm of the ascending aorta, without rupture: Secondary | ICD-10-CM

## 2022-06-14 DIAGNOSIS — I10 Essential (primary) hypertension: Secondary | ICD-10-CM | POA: Diagnosis not present

## 2022-06-14 NOTE — Patient Instructions (Signed)
Medication Instructions:  No changes *If you need a refill on your cardiac medications before your next appointment, please call your pharmacy*  Testing/Procedures: Your physician has requested that you have a Limited Echocardiogram in August. Echocardiography is a painless test that uses sound waves to create images of your heart. It provides your doctor with information about the size and shape of your heart and how well your heart's chambers and valves are working. This procedure takes approximately one hour. There are no restrictions for this procedure. Please do NOT wear cologne, perfume, aftershave, or lotions (deodorant is allowed). Please arrive 15 minutes prior to your appointment time.  Follow-Up: At [facility], you and your health needs are our priority.  As part of our continuing mission to provide you with exceptional heart care, we have created designated Provider Care Teams.  These Care Teams include your primary Cardiologist (physician) and Advanced Practice Providers (APPs -  Physician Assistants and Nurse Practitioners) who all work together to provide you with the care you need, when you need it.  We recommend signing up for the patient portal called "MyChart".  Sign up information is provided on this After Visit Summary.  MyChart is used to connect with patients for Virtual Visits (Telemedicine).  Patients are able to view lab/test results, encounter notes, upcoming appointments, etc.  Non-urgent messages can be sent to your provider as well.   To learn more about what you can do with MyChart, go to [email or web address].    Your next appointment:    Follow up visit in September  Provider:   Thurmon Fair, MD

## 2022-06-16 NOTE — Progress Notes (Signed)
Cardiology Office Note    Date:  06/16/2022   ID:  Jordan Oconnell, Jordan Oconnell March 24, 1940, MRN [id number]  PCP:  Geoffry Paradise, MD  Cardiologist:   Thurmon Fair, MD   No chief complaint on file.   History of Present Illness:  Jordan Oconnell is a 82 y.o. 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    Following pacemaker implantation it became apparent that he actually has a very high burden of atrial fibrillation at around 30%, often atrial flutter with 2: 1 AV conduction and rapid ventricular rates with a rate related left bundle branch block.   Starting in December 2023 and throughout the first few months of the year he had steadily deteriorating functional status related to tachycardia cardiomyopathy from a very high burden of atrial fibrillation.  EF was as low as 20-25% on the echocardiogram performed in February 2024.  He is now back in normal rhythm on treatment with amiodarone.  He feels much better.  Functional status has improved and he is now walking 1.75 miles every day and about 40 minutes.  This is not the same level of activity that he had in years past, but is a big improvement from the winter months.  He denies orthopnea, PND, lower extremity edema or chest pain either at rest or with activity.  He has not had dizziness or syncope.  On arrival his blood pressure was a little high at 159/87 but when rechecked it was 129/81.  His pacemaker is reprogrammed to provide 100% ventricular  pacing since he has a left bundle branch block lead which has a narrow QRS complex than the native left bundle branch block.  His pacemaker is followed by Dr. Lalla Brothers.  There has been no recurrence of atrial fibrillation since February 24 and he has not had any ventricular arrhythmia (he had a couple of episodes of atrial tachycardia less than 2 minutes in duration).  Presenting rhythm today is atrial sensed, ventricular paced with a QRS duration of 164 ms and a QTc of 509 ms, on amiodarone.  Device interrogation demonstrates a steady reduction in respiratory rate and steady increase in activity level following his cardioversion in February.  He currently has about 16% atrial pacing with good heart rate histogram distribution and 100% ventricular pacing.  He is on chronic anticoagulation with Eliquis and has not had any bleeding events (dose adjusted for kidney dysfunction and age).  He had normal liver function tests and TSH in February/March.  He has not had any falls since March 1 when he was briefly seen in the emergency department.  He has not had any hematuria.  His repeat echocardiogram performed 06/02/2022 shows improvement in LVEF which is up to 30-35%, but not back to normal.  The mitral inflow showed pseudonormalization.  Again noted is a moderate aortic dilation, maximum diameter 46 mm at the level of the root, 43 mm in the ascending aorta.  His TAVR valve parameters are all normal with a mean gradient of 8 mmHg.  There is no paravalvular leak.  Coronary angiography performed before  his TAVR in September 2022 showed a 90% stenosis in the first diagonal artery, otherwise only moderate scattered stenoses.  He has a moderate aortic root aneurysm measured at 46 mm in maximum diameter.  He has extensive (>50%) urothelial carcinoma in the solitary right kidney, but appears to have no progression of disease while on pembrolizumab (next infusion March 20) and no metastatic disease.    Followed at [facility]  by his urologist, Dr. Carlyle Dolly - now Dr. Sherryll Burger, most recent evaluation in June 2022 with no evidence of cancer recurrence by imaging studies since his left nephrectomy in June 2018.  He has a right lower quadrant ileal conduit and urostomy.  His creatinine has been stable at 2.8-3.0 over the last couple of years, slightly better at 2.5 at his last visit with Dr. Carolyne Fiscal in June 2022.  He has chronic hyperchloremic metabolic acidosis treated with sodium bicarbonate tablets.  His nephrologist is Dr. Hinda Lenis, but as far as I can tell his last visit with him was in June 2019.  His oncologist is Dr. Hilda Blades.     Past Medical History:  Diagnosis Date   Bladder cancer (HCC)    BPH (benign prostatic hyperplasia)    CKD (chronic kidney disease), stage IV (HCC)    Colon polyps    Diverticulosis    GERD (gastroesophageal reflux disease)    Hyperlipidemia    pt denies   Hypertension    PAF (paroxysmal atrial fibrillation) (HCC)    on Flecanide and diltiazem. 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 Excell Seltzer and Dr. Laneta Simmers   Severe aortic stenosis 10/05/2020    Past Surgical History:  Procedure Laterality Date   APPENDECTOMY     BLADDER SURGERY  2008   transurethral resection/resection of prostatic urethra   BOWEL RESECTION     CARDIOVERSION N/A 03/03/2022   Procedure: CARDIOVERSION;  Surgeon: Jodelle Red, MD;  Location: [facility] ENDOSCOPY;  Service: Cardiovascular;  Laterality: N/A;   CARDIOVERSION N/A 03/12/2022   Procedure: CARDIOVERSION;  Surgeon: Thomasene Ripple, DO;  Location: [facility] ENDOSCOPY;  Service: Cardiovascular;  Laterality: N/A;   ESOPHAGOGASTRODUODENOSCOPY N/A 07/24/2013   Procedure: ESOPHAGOGASTRODUODENOSCOPY (EGD);  Surgeon: Hilarie Fredrickson, MD;  Location: [facility] ENDOSCOPY;  Service: Endoscopy;  Laterality: N/A;    INTRAOPERATIVE TRANSTHORACIC ECHOCARDIOGRAM N/A 01/27/2021   Procedure: INTRAOPERATIVE TRANSTHORACIC ECHOCARDIOGRAM;  Surgeon: Tonny Bollman, MD;  Location: [facility] INVASIVE CV LAB;  Service: Open Heart Surgery;  Laterality: N/A;   laparoscopic surgery  2012   laser   (?)   NEPHRECTOMY  2007   partial, left   NEPHROSTOMY  2011   stent   NM MYOCAR PERF WALL MOTION  09/08/2010   Normal   PACEMAKER IMPLANT N/A 02/12/2021   Procedure: PACEMAKER IMPLANT;  Surgeon: Lanier Prude, MD;  Location: [facility] INVASIVE CV LAB;  Service: Cardiovascular;  Laterality: N/A;   RIGHT HEART CATH AND CORONARY ANGIOGRAPHY N/A 10/06/2020   Procedure: RIGHT HEART CATH AND CORONARY ANGIOGRAPHY;  Surgeon: Tonny Bollman, MD;  Location: [facility] INVASIVE CV LAB;  Service: Cardiovascular;  Laterality: N/A;   ROBOT ASSISTED LAPAROSCOPIC COMPLETE CYSTECT ILEAL CONDUIT     TRANSCATHETER AORTIC VALVE REPLACEMENT, TRANSFEMORAL Right 01/27/2021   Procedure: TRANSCATHETER AORTIC VALVE REPLACEMENT, TRANSFEMORAL;  Surgeon: Tonny Bollman, MD;  Location: [facility] INVASIVE CV LAB;  Service: Open Heart Surgery;  Laterality: Right;   US ECHOCARDIOGRAPHY  09/08/2010   mild diastolic dysfunction,mild dilated LA,mild MR,AI,mildly dilated aortic root    Current Medications: Outpatient Medications Prior to Visit  Medication Sig Dispense Refill   ALPRAZolam (XANAX) 0.25 MG tablet Take 0.25 mg by mouth at bedtime.     amiodarone (PACERONE) 200 MG tablet Take 2 tablets twice daily for 7 days (until 03/28/22), then starting on 03/29/22 continue with 1 tablet twice daily for 4 weeks until 04/25/22, then starting on 04/26/22 take one tablet daily. 90 tablet 0   azithromycin (ZITHROMAX) 500 MG tablet Take 1 tablet by mouth 1 hour before dental procedures and cleanings 12 tablet 6   carvedilol (COREG) 6.25 MG tablet Take 1 tablet (6.25 mg total) by mouth 2 (two) times daily with a meal. 180 tablet 3   ELIQUIS 2.5 MG TABS tablet Take 2.5 mg by mouth 2 (two) times  daily.     ferrous sulfate 325 (65 FE) MG EC tablet Take 1 tablet (325 mg total) by mouth in the morning and at bedtime. 180 tablet 3   latanoprost (XALATAN) 0.005 % ophthalmic solution Place 1 drop into both eyes at bedtime.     levothyroxine (SYNTHROID) 50 MCG tablet Take 50 mcg by mouth at bedtime.     mirtazapine (REMERON) 15 MG tablet Take 7.5 mg by mouth at bedtime.     sodium bicarbonate 650 MG tablet Take 1,300 mg by mouth 2 (two) times daily.     triamcinolone cream (KENALOG) 0.1 % Apply 1 Application topically as needed.     Pembrolizumab (KEYTRUDA IV) Inject into the vein every 6 (six) weeks. Infusion (Patient not taking: Reported on 06/14/2022)     torsemide (DEMADEX) 20 MG tablet Take 1 tablet (20 mg total) by mouth daily as needed (weight gain 2 to 3 lbs in 24 hrs or 5 lbs in 7 days.). (Patient not taking: Reported on 06/14/2022) 30 tablet 0   No facility-administered medications prior to visit.     Allergies:   Amoxicillin, Doxycycline, and Flomax [tamsulosin hcl]   Social History   Socioeconomic History   Marital status: Divorced    Spouse name: Not on file   Number of children: 2   Years of education: Not on file   Highest education level: Not on file  Occupational History   Occupation: Runner, broadcasting/film/video  Tobacco Use   Smoking status: Never   Smokeless tobacco: Never  Vaping Use   Vaping Use: Never used  Substance and Sexual Activity   Alcohol use: Yes    Comment: occasionally   Drug use: No   Sexual activity: Not on file  Other Topics Concern   Not on file  Social History Narrative   Not on file   Social Determinants of Health   Financial Resource Strain: Not on file  Food Insecurity: No Food Insecurity (03/13/2022)   Hunger Vital Sign    Worried About Running Out of Food in the Last Year: Never true    Ran Out of Food in the Last Year: Never true  Transportation Needs: No Transportation Needs (03/13/2022)   PRAPARE - Administrator, Civil Service  (Medical): No    Lack of Transportation (Non-Medical): No  Physical Activity: Not on file  Stress: Not on file  Social Connections: Not on file     Family History:  The patient's family history includes Breast cancer in his daughter; Colon cancer in his brother, father, and mother; Crohn's disease in his  son; Ovarian cancer in his mother.   ROS:   Please see the history of present illness.     All other systems are reviewed and are negative.  PHYSICAL EXAM:   VS:  BP 129/81   Pulse 64   Ht 6\' 1"  (1.854 m)   Wt 178 lb 6.4 oz (80.9 kg)   SpO2 96%   BMI 23.54 kg/m      General: Alert, oriented x3, no distress, healthy pacemaker site in the left subclavian area.  Appears much stronger than at his last appointment. Head: no evidence of trauma, PERRL, EOMI, no exophtalmos or lid lag, no myxedema, no xanthelasma; normal ears, nose and oropharynx Neck: normal jugular venous pulsations and no hepatojugular reflux; brisk carotid pulses without delay and no carotid bruits Chest: clear to auscultation, no signs of consolidation by percussion or palpation, normal fremitus, symmetrical and full respiratory excursions Cardiovascular: normal position and quality of the apical impulse, regular rhythm, normal first and second heart sounds, no murmurs, rubs or gallops Abdomen: no tenderness or distention, no masses by palpation, no abnormal pulsatility or arterial bruits, normal bowel sounds, no hepatosplenomegaly.  Right urostomy. Extremities: no clubbing, cyanosis or edema; 2+ radial, ulnar and brachial pulses bilaterally; 2+ right femoral, posterior tibial and dorsalis pedis pulses; 2+ left femoral, posterior tibial and dorsalis pedis pulses; no subclavian or femoral bruits Neurological: grossly nonfocal Psych: Normal mood and affect    Wt Readings from Last 3 Encounters:  06/14/22 178 lb 6.4 oz (80.9 kg)  04/06/22 175 lb 3.2 oz (79.5 kg)  04/02/22 173 lb 6.4 oz (78.7 kg)      Studies/Labs  Reviewed:   EKG:  EKG is ordered today and shows atrial sensed, ventricular paced rhythm (sinus with demand pacing).  The QRS is quite broad at 164 ms the QTc is 509 ms.  Holter tracing from May 2023 showed LVH and left anterior fascicular block with a QRS duration of only 100 ms.  Echocardiogram 02/19/2022   1. Systolic dysfunction is new compared with the echo 01/2021.  2. Left ventricular ejection fraction, by estimation, is 20 to 25%. The  left ventricle has severely decreased function. The left ventricle  demonstrates regional wall motion abnormalities (see scoring  diagram/findings for description). There is mild  concentric left ventricular hypertrophy. Left ventricular diastolic  function could not be evaluated.   3. Right ventricular systolic function is mildly reduced. The right  ventricular size is normal. There is normal pulmonary artery systolic  pressure.   4. Left atrial size was severely dilated.   5. The mitral valve is normal in structure. Mild mitral valve  regurgitation. No evidence of mitral stenosis.   6. The aortic valve has been repaired/replaced. Aortic valve  regurgitation is not visualized. No aortic stenosis is present. There is a  29 mm Sapien prosthetic (TAVR) valve present in the aortic position.  Aortic valve mean gradient measures 5.8 mmHg.  Aortic valve Vmax measures 1.58 m/s.   7. Aortic dilatation noted. There is moderate dilatation of the ascending  aorta, measuring 43 mm.   8. The inferior vena cava is normal in size with greater than 50%  respiratory variability, suggesting right atrial pressure of 3 mmHg.   AV Vmax:           157.60 cm/s  AV Peak Grad:      9.9 mmHg  AV Mean Grad:      5.8 mmHg  LVOT/AV VTI ratio: 0.30  Cardiac catheterization September 2022   Ramus lesion is 60% stenosed.   1st Diag lesion is 90% stenosed.   2nd Diag lesion is 50% stenosed.   Prox LAD to Mid LAD lesion is 30% stenosed.   Mid Cx lesion is 30%  stenosed.   1.  Nonobstructive coronary artery disease with separate LAD and left circumflex ostia, patent LAD with mild nonobstructive plaquing, severe ostial diagonal stenoses, patent left circumflex with moderate intermediate branch stenosis and no other significant stenoses, patent nondominant RCA 2.  Known severe aortic stenosis with heavy calcification and restriction of the aortic valve leaflets seen on plain fluoroscopy 3.  Normal right heart hemodynamics with preserved cardiac output, normal pulmonary pressures, and normal LVEDP  CT chest/aorta October 2022 1. Tricuspid aortic valve with acquired fusion of the RCC/LCC. Bulky calcification noted at the commissure between the RCC/LCC. 2. Severe aortic stenosis (calcium score 6064). 3. Annular measurements appropriate for 29 mm S3 TAVR (615 mm2). 4. Trivial annular calcification. 5. Sufficient coronary to annulus distance. 6. Optimal Fluoroscopic Angle for Delivery: LAO 17 CAU 14 7. 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   1. Vascular findings and measurements pertinent to potential TAVR procedure, as detailed above. 2. Severe thickening and calcification of the aortic valve, compatible with reported clinical history of severe aortic stenosis. 3. 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 Urologic consultation is recommended for further clinical evaluation. 4. Aortic atherosclerosis, in addition to two vessel coronary artery disease. 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 5. Mild cardiomegaly. 6. Cholelithiasis without evidence of acute cholecystitis at this time. 7. Status post left radical nephrectomy. 8. Large ventral hernia containing multiple loops of small bowel and a  portion of the cecum. 9. Additional incidental findings, as above.  CT abd/pelvis at [facility] 07/10/2021  1. Persistent nodular soft tissue thickening poorly evaluated on this  noncontrast exam within the left extrarenal pelvis which is concerning for  malignancy. There is slightly increased stranding about the extrarenal  pelvis which can be seen in the setting of infection. Correlate with  urinalysis.  2. No evidence of distant metastatic disease.    Recent Labs: January 27, 2018 creatinine 2.9, Hemoglobin 14.2 Total cholesterol 153, HDL 38, LDL 94, triglycerides 103  02/12/2020 Cholesterol 157, HDL 38, LDL 102, triglycerides 84 Hemoglobin A1c 5.5%, potassium 4.7, normal liver function tests, TSH 0.840  07/09/2020 ([facility]) Sodium 139, testing 4.4, chloride 113, CO2 19, creatinine 2.5, BUN 54, normal liver function tests, hemoglobin 15.1     Latest Ref Rng & Units 03/26/2022    8:19 PM 03/22/2022    3:35 AM 03/21/2022    6:35 AM  BMP  Glucose 70 - 99 mg/dL 88  90  161   BUN 8 - 23 mg/dL 66  41  39   Creatinine 0.61 - 1.24 mg/dL 0.96  0.45  4.09   Sodium 135 - 145 mmol/L 138  139  139   Potassium 3.5 - 5.1 mmol/L 4.6  4.3  4.4   Chloride 98 - 111 mmol/L 105  110  110   CO2 22 - 32 mmol/L 21  19  18    Calcium 8.9 - 10.3 mg/dL 9.5  8.8  8.8    01 10 2023 Hemoglobin 13.6  Lipid Panel  No results found for: "CHOL", "TRIG", "HDL", "CHOLHDL", "VLDL", "LDLCALC", "LDLDIRECT", "LABVLDL"   01 16 2023 Cholesterol 144, HDL 35, LDL 93, triglycerides 78 Hemoglobin A1c 5.2% Potassium 5.1, ALT 20, TSH 1.73   Additional studies/ records that were reviewed today include:  Notes from follow-up visit with Dr. Carolyne Fiscal, Kateri Mc from June 2022   ASSESSMENT:    1. Chronic combined systolic and diastolic CHF (congestive heart failure) (HCC)   2. Pacemaker   3. LBBB (left bundle branch block)   4. Atypical atrial flutter (HCC)   5. Encounter for monitoring amiodarone therapy   6. S/P TAVR  (transcatheter aortic valve replacement)   7. Acquired thrombophilia (HCC)   8. Essential hypertension   9. CKD (chronic kidney disease) stage 4, GFR 15-29 ml/min (HCC)   10. Aneurysm of ascending aorta without rupture (HCC)   11. Urothelial cancer (HCC)        PLAN:  In order of problems listed above:  CHF: Clinically he appears euvolemic, and I think we will estimate his new "dry weight to be 180 pounds or less.  NYHA functional class IIa.  I am surprised that we have not seen a more significant improvement in LV function since he does not have coronary disease and he has not had any arrhythmia in about 2-1/2 months.  We need to keep an eye on this.  Hard to say whether we are really helping with committed ventricular pacing and hard to say whether the treatment with amiodarone will not compel [place] to have 100% ventricular pacing.  Reassess later this year. LBBB: A year ago he had a fairly narrow QRS complex with left anterior fascicular block.  Late in 2023 he developed a very broad LBBB (180 ms), but did not have heart failure until he developed tachycardia.  I do not think that his wide QRS is simply rate related, since it has been present intermittently before even at slower heart rates.  His paced QRS (left bundle branch lead is narrower at 158 ms, so we have reprogrammed his device for purposeful ventricular pacing that hopefully will provide better synchrony.  Even with prolongation of the AV delay today to the maximum allowed for his upper tracking rate, he still had ventricular pacing.  He is not a great candidate for upgrade to a CRT device due to renal dysfunction, although he did very well with pre-TAVR contrast scans and angiography. AFlutter: Doing very well on amiodarone without any meaningful arrhythmia since late February.  Flecainide was stopped after cath showed evidence of CAD.  Not a candidate for Tikosyn due to CKD.    Amiodarone: Reminded him of the need to monitor liver and  thyroid function tests every 6 months, avoid sun exposure without protection, have a yearly eye exam, promptly report any unexplained respiratory symptoms, be aware of many possible amiodarone drug interactions. AS s/p TAVR: Normal prosthetic valve function with stable gradients.  He is aware of the need for endocarditis prophylaxis with dental procedures.. Anticoagulation: CHA2DS2-VASc score 4 for age and hypertension, heart failure.  He has not had strokes or other embolic events.  Eliquis dose is adjusted for age and renal dysfunction.  He has had recurrent problems with hematuria, thankfully none in the last couple of months.  He has been back on Eliquis 2.5 mg twice daily without bleeding events since 01/28/2022.  Discussed Watchman device implantation with Dr. Lalla Brothers who felt that due to his CKD and other comorbid conditions it would not  be a good choice.  Hopefully will not have hematuria going forward. HTN: Blood pressure was a little high on initial testing, but came down during the clinic appointment. CKD4: Most recent creatinine was 3.1 and potassium 4.6 about 6 weeks ago.  Due to neprhectomy (total L, partial R). Prevents use of usual guideline directed medical therapy for heart failure.  Stable following iodinated contrast based cath and CTA last fall and TAVR this January. He does have chronic metabolic acidosis and takes sodium bicarbonate supplements. Asc Ao Aneurysm: This is relatively mild.  He is not a candidate for elective surgery. Routine follow up not indicated. Widespread urothelial cancer , his Urologist at [facility] is Dr. Sherryll Burger.  He saw his oncologist, Dr. Hilda Blades on 05/06/2022.  He has completed 12 months of pembrolizumab and radiation therapy and is currently not on active treatment.  He is scheduled to return for scans in October.       Shared Decision Making/Informed Consent The risks (stroke, cardiac arrhythmias rarely resulting in the need for a temporary or permanent  pacemaker, skin irritation or burns and complications associated with conscious sedation including aspiration, arrhythmia, respiratory failure and death), benefits (restoration of normal sinus rhythm) and alternatives of a direct current cardioversion were explained in detail to Mr. Coffee and he agrees to proceed.     Medication Adjustments/Labs and Tests Ordered: Current medicines are reviewed at length with the patient today.  Concerns regarding medicines are outlined above.  Medication changes, Labs and Tests ordered today are listed in the Patient Instructions below. Patient Instructions  Medication Instructions:  No changes *If you need a refill on your cardiac medications before your next appointment, please call your pharmacy*  Testing/Procedures: Your physician has requested that you have a Limited Echocardiogram in August. Echocardiography is a painless test that uses sound waves to create images of your heart. It provides your doctor with information about the size and shape of your heart and how well your heart's chambers and valves are working. This procedure takes approximately one hour. There are no restrictions for this procedure. Please do NOT wear cologne, perfume, aftershave, or lotions (deodorant is allowed). Please arrive 15 minutes prior to your appointment time.  Follow-Up: At [facility], you and your health needs are our priority.  As part of our continuing mission to provide you with exceptional heart care, we have created designated Provider Care Teams.  These Care Teams include your primary Cardiologist (physician) and Advanced Practice Providers (APPs -  Physician Assistants and Nurse Practitioners) who all work together to provide you with the care you need, when you need it.  We recommend signing up for the patient portal called "MyChart".  Sign up information is provided on this After Visit Summary.  MyChart is used to connect with patients for Virtual  Visits (Telemedicine).  Patients are able to view lab/test results, encounter notes, upcoming appointments, etc.  Non-urgent messages can be sent to your provider as well.   To learn more about what you can do with MyChart, go to [email or web address].    Your next appointment:    Follow up visit in September  Provider:   Thurmon Fair, MD       Signed, Thurmon Fair, MD  06/16/2022 3:36 PM    [facility] [address] [place], [place], [place]  [address] Phone: [phone]; Fax: [phone]

## 2022-06-16 NOTE — Progress Notes (Signed)
Remote pacemaker transmission.   

## 2022-06-22 ENCOUNTER — Other Ambulatory Visit: Payer: Self-pay

## 2022-06-22 DIAGNOSIS — Z936 Other artificial openings of urinary tract status: Secondary | ICD-10-CM | POA: Diagnosis not present

## 2022-06-22 MED ORDER — ELIQUIS 2.5 MG PO TABS: 2.5000 mg | ORAL_TABLET | Freq: Two times a day (BID) | ORAL | 5 refills | Status: DC

## 2022-06-22 NOTE — Telephone Encounter (Signed)
Prescription refill request for Eliquis received. Indication:AFIB Last office visit:5/24 Scr:2.1  3/24 Age: 82 Weight:80.9  KG  PRESCRIPTION REFILLED

## 2022-06-28 DIAGNOSIS — H35033 Hypertensive retinopathy, bilateral: Secondary | ICD-10-CM | POA: Diagnosis not present

## 2022-06-28 DIAGNOSIS — H31092 Other chorioretinal scars, left eye: Secondary | ICD-10-CM | POA: Diagnosis not present

## 2022-06-28 DIAGNOSIS — H43393 Other vitreous opacities, bilateral: Secondary | ICD-10-CM | POA: Diagnosis not present

## 2022-06-28 DIAGNOSIS — H353122 Nonexudative age-related macular degeneration, left eye, intermediate dry stage: Secondary | ICD-10-CM | POA: Diagnosis not present

## 2022-06-28 DIAGNOSIS — H353211 Exudative age-related macular degeneration, right eye, with active choroidal neovascularization: Secondary | ICD-10-CM | POA: Diagnosis not present

## 2022-06-28 DIAGNOSIS — H43813 Vitreous degeneration, bilateral: Secondary | ICD-10-CM | POA: Diagnosis not present

## 2022-06-28 DIAGNOSIS — D3131 Benign neoplasm of right choroid: Secondary | ICD-10-CM | POA: Diagnosis not present

## 2022-06-30 DIAGNOSIS — Z936 Other artificial openings of urinary tract status: Secondary | ICD-10-CM | POA: Diagnosis not present

## 2022-07-05 DIAGNOSIS — Z936 Other artificial openings of urinary tract status: Secondary | ICD-10-CM | POA: Diagnosis not present

## 2022-08-02 DIAGNOSIS — L308 Other specified dermatitis: Secondary | ICD-10-CM | POA: Diagnosis not present

## 2022-08-02 DIAGNOSIS — I480 Paroxysmal atrial fibrillation: Secondary | ICD-10-CM | POA: Diagnosis not present

## 2022-08-02 DIAGNOSIS — Z85528 Personal history of other malignant neoplasm of kidney: Secondary | ICD-10-CM | POA: Diagnosis not present

## 2022-08-02 DIAGNOSIS — R7301 Impaired fasting glucose: Secondary | ICD-10-CM | POA: Diagnosis not present

## 2022-08-02 DIAGNOSIS — I129 Hypertensive chronic kidney disease with stage 1 through stage 4 chronic kidney disease, or unspecified chronic kidney disease: Secondary | ICD-10-CM | POA: Diagnosis not present

## 2022-08-02 DIAGNOSIS — N184 Chronic kidney disease, stage 4 (severe): Secondary | ICD-10-CM | POA: Diagnosis not present

## 2022-08-02 DIAGNOSIS — Z8551 Personal history of malignant neoplasm of bladder: Secondary | ICD-10-CM | POA: Diagnosis not present

## 2022-08-03 DIAGNOSIS — L308 Other specified dermatitis: Secondary | ICD-10-CM | POA: Diagnosis not present

## 2022-08-03 DIAGNOSIS — H25813 Combined forms of age-related cataract, bilateral: Secondary | ICD-10-CM | POA: Diagnosis not present

## 2022-08-03 DIAGNOSIS — H40023 Open angle with borderline findings, high risk, bilateral: Secondary | ICD-10-CM | POA: Diagnosis not present

## 2022-08-03 DIAGNOSIS — H524 Presbyopia: Secondary | ICD-10-CM | POA: Diagnosis not present

## 2022-08-03 DIAGNOSIS — Z85828 Personal history of other malignant neoplasm of skin: Secondary | ICD-10-CM | POA: Diagnosis not present

## 2022-08-13 DIAGNOSIS — Z936 Other artificial openings of urinary tract status: Secondary | ICD-10-CM | POA: Diagnosis not present

## 2022-08-19 DIAGNOSIS — N390 Urinary tract infection, site not specified: Secondary | ICD-10-CM | POA: Diagnosis not present

## 2022-08-24 ENCOUNTER — Ambulatory Visit (INDEPENDENT_AMBULATORY_CARE_PROVIDER_SITE_OTHER): Payer: Medicare PPO

## 2022-08-24 DIAGNOSIS — I447 Left bundle-branch block, unspecified: Secondary | ICD-10-CM | POA: Diagnosis not present

## 2022-08-24 DIAGNOSIS — I495 Sick sinus syndrome: Secondary | ICD-10-CM

## 2022-09-01 ENCOUNTER — Telehealth: Payer: Self-pay | Admitting: Cardiovascular Disease

## 2022-09-01 NOTE — Telephone Encounter (Signed)
     Pre-operative Risk Assessment    Patient Name: Jordan Oconnell  DOB: 09-Dec-1940 MRN: [id number]     Request for Surgical Clearance    Procedure:  Dental Extraction - Amount of Teeth to be Pulled:  1 tooth   Date of Surgery:  Clearance TBD                                 Surgeon:  Dr. Fonnie Jarvis  Surgeon's Group or Practice Name:  [facility]  Phone number:  [phone] Fax number:  [phone]   Type of Clearance Requested:   - Medical  - Pharmacy:  Hold defer to cards      Type of Anesthesia:  Local    Additional requests/questions:    Signed, Noe Gens   09/01/2022, 2:09 PM

## 2022-09-01 NOTE — Telephone Encounter (Signed)
   Patient Name: Jordan Oconnell  DOB: Aug 18, 1940 MRN: [id number]  Primary Cardiologist: Thurmon Fair, MD  Chart reviewed as part of pre-operative protocol coverage.   IF SIMPLE EXTRACTION/CLEANINGS: Simple dental extractions (i.e. 1-2 teeth) are considered low risk procedures per guidelines and generally do not require any specific cardiac clearance. It is also generally accepted that for simple extractions and dental cleanings, there is no need to interrupt blood thinner therapy.   SBE prophylaxis is required for the patient from a cardiac standpoint due to TAVR He already has Rx for Azithromycin 500 mg at home for SBE prophylaxis and knows to take it prior to extraction. Confirmed by phone call to patient.   I will route this recommendation to the requesting party via Epic fax function and remove from pre-op pool.  Please call with questions.  Joni Reining, NP 09/01/2022, 4:47 PM

## 2022-09-04 DIAGNOSIS — R319 Hematuria, unspecified: Secondary | ICD-10-CM | POA: Diagnosis not present

## 2022-09-04 DIAGNOSIS — C689 Malignant neoplasm of urinary organ, unspecified: Secondary | ICD-10-CM | POA: Diagnosis not present

## 2022-09-06 DIAGNOSIS — C662 Malignant neoplasm of left ureter: Secondary | ICD-10-CM | POA: Diagnosis not present

## 2022-09-06 DIAGNOSIS — C661 Malignant neoplasm of right ureter: Secondary | ICD-10-CM | POA: Diagnosis not present

## 2022-09-06 DIAGNOSIS — N184 Chronic kidney disease, stage 4 (severe): Secondary | ICD-10-CM | POA: Diagnosis not present

## 2022-09-06 DIAGNOSIS — C678 Malignant neoplasm of overlapping sites of bladder: Secondary | ICD-10-CM | POA: Diagnosis not present

## 2022-09-06 DIAGNOSIS — C642 Malignant neoplasm of left kidney, except renal pelvis: Secondary | ICD-10-CM | POA: Diagnosis not present

## 2022-09-06 DIAGNOSIS — C689 Malignant neoplasm of urinary organ, unspecified: Secondary | ICD-10-CM | POA: Diagnosis not present

## 2022-09-15 NOTE — Progress Notes (Signed)
Remote pacemaker transmission.   

## 2022-09-20 DIAGNOSIS — H35033 Hypertensive retinopathy, bilateral: Secondary | ICD-10-CM | POA: Diagnosis not present

## 2022-09-20 DIAGNOSIS — H31092 Other chorioretinal scars, left eye: Secondary | ICD-10-CM | POA: Diagnosis not present

## 2022-09-20 DIAGNOSIS — D3131 Benign neoplasm of right choroid: Secondary | ICD-10-CM | POA: Diagnosis not present

## 2022-09-20 DIAGNOSIS — H353122 Nonexudative age-related macular degeneration, left eye, intermediate dry stage: Secondary | ICD-10-CM | POA: Diagnosis not present

## 2022-09-20 DIAGNOSIS — H43813 Vitreous degeneration, bilateral: Secondary | ICD-10-CM | POA: Diagnosis not present

## 2022-09-20 DIAGNOSIS — H353211 Exudative age-related macular degeneration, right eye, with active choroidal neovascularization: Secondary | ICD-10-CM | POA: Diagnosis not present

## 2022-09-20 DIAGNOSIS — H43393 Other vitreous opacities, bilateral: Secondary | ICD-10-CM | POA: Diagnosis not present

## 2022-09-22 ENCOUNTER — Ambulatory Visit (HOSPITAL_COMMUNITY): Payer: Medicare PPO | Attending: Internal Medicine

## 2022-09-22 DIAGNOSIS — I5042 Chronic combined systolic (congestive) and diastolic (congestive) heart failure: Secondary | ICD-10-CM | POA: Insufficient documentation

## 2022-09-22 LAB — ECHOCARDIOGRAM LIMITED
AR max vel: 1.27 cm2
AV Area VTI: 1.28 cm2
AV Area mean vel: 1.26 cm2
AV Mean grad: 7.7 mmHg
AV Peak grad: 14.5 mmHg
Ao pk vel: 1.91 m/s
Area-P 1/2: 2.95 cm2
S' Lateral: 3.35 cm

## 2022-10-04 DIAGNOSIS — Z936 Other artificial openings of urinary tract status: Secondary | ICD-10-CM | POA: Diagnosis not present

## 2022-10-06 ENCOUNTER — Encounter: Payer: Self-pay | Admitting: Cardiovascular Disease

## 2022-10-06 ENCOUNTER — Telehealth: Payer: Self-pay | Admitting: Cardiovascular Disease

## 2022-10-06 ENCOUNTER — Ambulatory Visit: Payer: Medicare PPO | Attending: Cardiovascular Disease | Admitting: Cardiovascular Disease

## 2022-10-06 VITALS — BP 128/75 | HR 63 | Ht 73.0 in | Wt 192.8 lb

## 2022-10-06 DIAGNOSIS — I7121 Aneurysm of the ascending aorta, without rupture: Secondary | ICD-10-CM | POA: Diagnosis not present

## 2022-10-06 DIAGNOSIS — I739 Peripheral vascular disease, unspecified: Secondary | ICD-10-CM

## 2022-10-06 DIAGNOSIS — N184 Chronic kidney disease, stage 4 (severe): Secondary | ICD-10-CM | POA: Diagnosis not present

## 2022-10-06 DIAGNOSIS — I5042 Chronic combined systolic (congestive) and diastolic (congestive) heart failure: Secondary | ICD-10-CM | POA: Diagnosis not present

## 2022-10-06 DIAGNOSIS — C689 Malignant neoplasm of urinary organ, unspecified: Secondary | ICD-10-CM

## 2022-10-06 DIAGNOSIS — R030 Elevated blood-pressure reading, without diagnosis of hypertension: Secondary | ICD-10-CM

## 2022-10-06 DIAGNOSIS — I484 Atypical atrial flutter: Secondary | ICD-10-CM | POA: Diagnosis not present

## 2022-10-06 DIAGNOSIS — H40013 Open angle with borderline findings, low risk, bilateral: Secondary | ICD-10-CM | POA: Diagnosis not present

## 2022-10-06 DIAGNOSIS — I447 Left bundle-branch block, unspecified: Secondary | ICD-10-CM | POA: Diagnosis not present

## 2022-10-06 DIAGNOSIS — H25813 Combined forms of age-related cataract, bilateral: Secondary | ICD-10-CM | POA: Diagnosis not present

## 2022-10-06 DIAGNOSIS — D6869 Other thrombophilia: Secondary | ICD-10-CM

## 2022-10-06 DIAGNOSIS — Z952 Presence of prosthetic heart valve: Secondary | ICD-10-CM | POA: Diagnosis not present

## 2022-10-06 DIAGNOSIS — Z79899 Other long term (current) drug therapy: Secondary | ICD-10-CM

## 2022-10-06 NOTE — Telephone Encounter (Signed)
Called the patient and told of 2nd bp= 128/75 and that I just added to the chart. He was thankful.

## 2022-10-06 NOTE — Telephone Encounter (Signed)
Patient states he was in for a visit today and when they first checked his blood pressure it was 160/94.  He states Dr. Royann Shivers rechecked his blood pressure and it was 125/? (He can't remember the bottom number), he would like for his chart to reflect the lower reading.  When this is done he would like for a callback.

## 2022-10-06 NOTE — Telephone Encounter (Signed)
Patient states BP 160/94 on arrival and was retaken.Marland Kitchen  He wants his lower BP once retaken to be visible in his chart.  He states it is not in chart and wants to be called once completed.  He is adamant that it be added Thank you

## 2022-10-06 NOTE — Progress Notes (Signed)
Cardiology Office Note    Date:  10/10/2022   ID:  Jordan Oconnell, Jordan Oconnell November 13, 1940, MRN [id number]  PCP:  Geoffry Paradise, MD  Cardiologist:   Thurmon Fair, MD   Chief Complaint  Patient presents with   Cardiac Valve Problem   Irregular Heart Beat    History of Present Illness:  Jordan Oconnell is a 82 y.o. 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    Following pacemaker implantation it became apparent that he actually has a very high burden of atrial fibrillation at around 30%, often atrial flutter with 2: 1 AV conduction and rapid ventricular rates with a rate related left bundle branch block.   He is doing much better.  He is walking 3.1-3.2 miles every day covering that distance in about 17 minutes.  Denies shortness of breath or angina at rest or with activity.  He has not had palpitations, dizziness or syncope.  He denies lower extremity edema, orthopnea and PND.    He did have an episode of hematuria and underwent a CT at [facility] 09/04/2022, but right renal pelvis changes were actually improved and the problem has spontaneously resolved.  He has not had other bleeding events and has not had any falls.  Follow-up echocardiogram 09/22/2022 shows almost complete normalization of LV function, consistent with resolution of tachycardia cardiomyopathy.  LVEF is now 50-55%.  Mild reduction LV function is attributable to RV apical pacing related dyssynchrony.  He  has an unchanged 46 mm aneurysm of the ascending aorta and has normal TAVR parameters.  When he came in he was upset because his nephrostomy tube had sprung a leak in his blood pressure was high.  When I rechecked his blood pressure was completely normal.  His pacemaker is reprogrammed to provide 100% ventricular pacing since he has a left bundle branch block lead which has a narrow QRS complex than the native left bundle branch block.  His pacemaker is followed by Dr. Lalla Brothers.  He has had a very low prevalence of atrial fibrillation less than 1%, but the longest episode has been up to 80 minutes in duration.  He has only 9% atrial pacing and 99% ventricular pacing.  Heart rate histogram distribution is appropriate.  Activity level is very steady at around 2 hours a day.  The heart rate histogram distribution is appropriate.  Presenting rhythm today is atrial sensed (sinus), ventricular paced rhythm.    Coronary angiography performed before his TAVR in September 2022 showed a 90% stenosis in the first diagonal artery, otherwise only moderate scattered stenoses.  He has a moderate aortic root aneurysm measured at 46 mm in maximum diameter.  He has extensive (>50%) urothelial carcinoma in the solitary right kidney, but appears to have no progression of disease while on pembrolizumab (next infusion March 20) and no metastatic disease.    Followed at [facility] by his urologist, Dr. Carlyle Dolly - now Dr. Sherryll Burger, most recent evaluation in June 2022 with no evidence of cancer recurrence by imaging studies since his left nephrectomy in June 2018.  He has a right lower quadrant ileal conduit and urostomy.  His creatinine has been stable at 2.8-3.0 over the last couple of years, slightly better at 2.5 at his last visit with Dr. Carolyne Fiscal in June 2022.  He has chronic hyperchloremic metabolic acidosis treated with sodium bicarbonate tablets.  His nephrologist is Dr. Hinda Lenis, but as far as I can tell his last visit with him  was in June 2019.  His oncologist is Dr. Hilda Blades.     Past Medical History:  Diagnosis Date   Bladder cancer (HCC)    BPH (benign prostatic hyperplasia)    CKD (chronic kidney disease), stage IV (HCC)    Colon polyps    Diverticulosis    GERD (gastroesophageal reflux disease)    Hyperlipidemia    pt denies   Hypertension    PAF (paroxysmal atrial fibrillation) (HCC)    on Flecanide and diltiazem. 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 Excell Seltzer and Dr. Laneta Simmers   Severe aortic stenosis 10/05/2020    Past Surgical History:  Procedure Laterality Date   APPENDECTOMY     BLADDER SURGERY  2008   transurethral resection/resection of prostatic urethra   BOWEL RESECTION     CARDIOVERSION N/A 03/03/2022   Procedure: CARDIOVERSION;  Surgeon: Jodelle Red, MD;  Location: [facility] ENDOSCOPY;  Service: Cardiovascular;  Laterality: N/A;   CARDIOVERSION N/A 03/12/2022   Procedure: CARDIOVERSION;  Surgeon: Thomasene Ripple, DO;  Location: [facility] ENDOSCOPY;  Service: Cardiovascular;  Laterality: N/A;   ESOPHAGOGASTRODUODENOSCOPY N/A 07/24/2013   Procedure: ESOPHAGOGASTRODUODENOSCOPY (EGD);  Surgeon: Hilarie Fredrickson, MD;  Location: [facility] ENDOSCOPY;  Service: Endoscopy;  Laterality: N/A;   INTRAOPERATIVE TRANSTHORACIC ECHOCARDIOGRAM N/A 01/27/2021   Procedure: INTRAOPERATIVE TRANSTHORACIC ECHOCARDIOGRAM;  Surgeon: Tonny Bollman, MD;  Location[facility] INVASIVE CV LAB;  Service: Open Heart Surgery;  Laterality: N/A;   laparoscopic surgery  2012   laser   (?)   NEPHRECTOMY  2007   partial, left   NEPHROSTOMY  2011   stent   NM MYOCAR PERF WALL MOTION  09/08/2010   Normal   PACEMAKER IMPLANT N/A 02/12/2021   Procedure: PACEMAKER IMPLANT;  Surgeon: Lanier Prude, MD;  Location: [facility] INVASIVE CV LAB;  Service: Cardiovascular;  Laterality: N/A;   RIGHT  HEART CATH AND CORONARY ANGIOGRAPHY N/A 10/06/2020   Procedure: RIGHT HEART CATH AND CORONARY ANGIOGRAPHY;  Surgeon: Tonny Bollman, MD;  Location: [facility] INVASIVE CV LAB;  Service: Cardiovascular;  Laterality: N/A;   ROBOT ASSISTED LAPAROSCOPIC COMPLETE CYSTECT ILEAL CONDUIT     TRANSCATHETER AORTIC VALVE REPLACEMENT, TRANSFEMORAL Right 01/27/2021   Procedure: TRANSCATHETER AORTIC VALVE REPLACEMENT, TRANSFEMORAL;  Surgeon: Tonny Bollman, MD;  Location: [facility] INVASIVE CV LAB;  Service: Open Heart Surgery;  Laterality: Right;   US ECHOCARDIOGRAPHY  09/08/2010   mild diastolic dysfunction,mild dilated LA,mild MR,AI,mildly dilated aortic root    Current Medications: Outpatient Medications Prior to Visit  Medication Sig Dispense Refill   ALPRAZolam (XANAX) 0.25 MG tablet Take 0.25 mg by mouth at bedtime.     amiodarone (PACERONE) 200 MG tablet Take 2 tablets twice daily for 7 days (until 03/28/22), then starting on 03/29/22 continue with 1 tablet twice daily for 4 weeks until 04/25/22, then starting on 04/26/22 take one tablet daily. 90 tablet 0   azithromycin (ZITHROMAX) 500 MG tablet Take 1 tablet  by mouth 1 hour before dental procedures and cleanings 12 tablet 6   carvedilol (COREG) 6.25 MG tablet Take 1 tablet (6.25 mg total) by mouth 2 (two) times daily with a meal. 180 tablet 3   ELIQUIS 2.5 MG TABS tablet Take 1 tablet (2.5 mg total) by mouth 2 (two) times daily. 60 tablet 5   ferrous sulfate 325 (65 FE) MG EC tablet Take 1 tablet (325 mg total) by mouth in the morning and at bedtime. 180 tablet 3   latanoprost (XALATAN) 0.005 % ophthalmic solution Place 1 drop into both eyes at bedtime.     levothyroxine (SYNTHROID) 50 MCG tablet Take 50 mcg by mouth at bedtime.     mirtazapine (REMERON) 15 MG tablet Take 7.5 mg by mouth at bedtime.     sodium bicarbonate 650 MG tablet Take 1,300 mg by mouth 2 (two) times daily.     triamcinolone cream (KENALOG) 0.1 % Apply 1 Application topically as needed.      torsemide (DEMADEX) 20 MG tablet Take 1 tablet (20 mg total) by mouth daily as needed (weight gain 2 to 3 lbs in 24 hrs or 5 lbs in 7 days.). (Patient not taking: Reported on 10/06/2022) 30 tablet 0   No facility-administered medications prior to visit.     Allergies:   Amoxicillin, Doxycycline, and Flomax [tamsulosin hcl]   Social History   Socioeconomic History   Marital status: Divorced    Spouse name: Not on file   Number of children: 2   Years of education: Not on file   Highest education level: Not on file  Occupational History   Occupation: Runner, broadcasting/film/video  Tobacco Use   Smoking status: Never   Smokeless tobacco: Never  Vaping Use   Vaping status: Never Used  Substance and Sexual Activity   Alcohol use: Yes    Comment: occasionally   Drug use: No   Sexual activity: Not on file  Other Topics Concern   Not on file  Social History Narrative   Not on file   Social Determinants of Health   Financial Resource Strain: Not on file  Food Insecurity: No Food Insecurity (03/13/2022)   Hunger Vital Sign    Worried About Running Out of Food in the Last Year: Never true    Ran Out of Food in the Last Year: Never true  Transportation Needs: No Transportation Needs (03/13/2022)   PRAPARE - Administrator, Civil Service (Medical): No    Lack of Transportation (Non-Medical): No  Physical Activity: Not on file  Stress: Not on file  Social Connections: Not on file     Family History:  The patient's family history includes Breast cancer in his daughter; Colon cancer in his brother, father, and mother; Crohn's disease in his son; Ovarian cancer in his mother.   ROS:   Please see the history of present illness.     All other systems are reviewed and are negative.  PHYSICAL EXAM:   VS:  BP 128/75   Pulse 63   Ht 6\' 1"  (1.854 m)   Wt 192 lb 12.8 oz (87.5 kg)   SpO2 94%   BMI 25.44 kg/m      General: Alert, oriented x3, no distress, healthy pacemaker site in the left  subclavian area.  He appears very fit and very positive.  Healthy subclavian pacemaker site. Head: no evidence of trauma, PERRL, EOMI, no exophtalmos or lid lag, no myxedema, no xanthelasma; normal ears, nose and oropharynx  Neck: normal jugular venous pulsations and no hepatojugular reflux; brisk carotid pulses without delay and no carotid bruits Chest: clear to auscultation, no signs of consolidation by percussion or palpation, normal fremitus, symmetrical and full respiratory excursions Cardiovascular: normal position and quality of the apical impulse, regular rhythm, normal first and second heart sounds, no murmurs, rubs or gallops Abdomen: no tenderness or distention, no masses by palpation, no abnormal pulsatility or arterial bruits, normal bowel sounds, no hepatosplenomegaly.  Right urostomy. Extremities: no clubbing, cyanosis or edema; 2+ radial, ulnar and brachial pulses bilaterally; 2+ right femoral, posterior tibial and dorsalis pedis pulses; 2+ left femoral, posterior tibial and dorsalis pedis pulses; no subclavian or femoral bruits Neurological: grossly nonfocal Psych: Normal mood and affect    Wt Readings from Last 3 Encounters:  10/06/22 192 lb 12.8 oz (87.5 kg)  06/14/22 178 lb 6.4 oz (80.9 kg)  04/06/22 175 lb 3.2 oz (79.5 kg)      Studies/Labs Reviewed:   EKG:  EKG is not ordered today.  The most recent tracing from May 2024 is reviewed and shows atrial sensed, ventricular paced rhythm (sinus with demand pacing).  The QRS is quite broad at 164 ms the QTc is 509 ms.  Holter tracing from May 2023 showed LVH and left anterior fascicular block with a QRS duration of only 100 ms.  Echocardiogram 09/22/2022  1. Left ventricular ejection fraction, by estimation, is 50 to 55%. The  left ventricle has low normal function. Left ventricular endocardial  border not optimally defined to evaluate regional wall motion.  Septal-lateral dyssynchrony consistent with RV  pacing. There is  moderate asymmetric left ventricular hypertrophy of the  basal-septal segment. Left ventricular diastolic parameters are consistent  with Grade I diastolic dysfunction (impaired relaxation).   2. Right ventricular systolic function is normal. The right ventricular  size is normal. Tricuspid regurgitation signal is inadequate for assessing  PA pressure.   3. The mitral valve is normal in structure. No evidence of mitral valve  regurgitation. No evidence of mitral stenosis.   4. Bioprosthetic aortic valve s/p TAVR with 23 mm Edwards Sapien 3 THV.  Mean gradient 8 mmHg with no perivalvular leakage, dimensionless index  0.36, EOA 1.26 cm^2.   5. Aortic dilatation noted. There is mild dilatation of the ascending  aorta, measuring 43 mm. There is mild dilatation of the aortic root,  measuring 43 mm.   6. The inferior vena cava is normal in size with greater than 50%  respiratory variability, suggesting right atrial pressure of 3 mmHg.    AV Vmax:           190.67 cm/s  AV Peak Grad:      14.5 mmHg  AV Mean Grad:      7.7 mmHg  LVOT/AV VTI ratio: 0.37    Cardiac catheterization September 2022   Ramus lesion is 60% stenosed.   1st Diag lesion is 90% stenosed.   2nd Diag lesion is 50% stenosed.   Prox LAD to Mid LAD lesion is 30% stenosed.   Mid Cx lesion is 30% stenosed.   1.  Nonobstructive coronary artery disease with separate LAD and left circumflex ostia, patent LAD with mild nonobstructive plaquing, severe ostial diagonal stenoses, patent left circumflex with moderate intermediate branch stenosis and no other significant stenoses, patent nondominant RCA 2.  Known severe aortic stenosis with heavy calcification and restriction of the aortic valve leaflets seen on plain fluoroscopy 3.  Normal right heart hemodynamics with preserved cardiac output,  normal pulmonary pressures, and normal LVEDP Recent Labs: 09/06/2022  Hemoglobin 14.0 Creatinine 3.2, BUN 49     Latest Ref Rng & Units  03/26/2022    8:19 PM 03/22/2022    3:35 AM 03/21/2022    6:35 AM  BMP  Glucose 70 - 99 mg/dL 88  90  161   BUN 8 - 23 mg/dL 66  41  39   Creatinine 0.61 - 1.24 mg/dL 0.96  0.45  4.09   Sodium 135 - 145 mmol/L 138  139  139   Potassium 3.5 - 5.1 mmol/L 4.6  4.3  4.4   Chloride 98 - 111 mmol/L 105  110  110   CO2 22 - 32 mmol/L 21  19  18    Calcium 8.9 - 10.3 mg/dL 9.5  8.8  8.8     Lipid Panel  No results found for: "CHOL", "TRIG", "HDL", "CHOLHDL", "VLDL", "LDLCALC", "LDLDIRECT", "LABVLDL"   01 16 2023 Cholesterol 144, HDL 35, LDL 93, triglycerides 78 Hemoglobin A1c 5.2% Potassium 5.1, ALT 20, TSH 1.73   Additional studies/ records that were reviewed today include:  Notes from follow-up visit from [facility] oncology center with Harmon Dun, PA (with Dr. Carolyne Fiscal) 09/22/2022   ASSESSMENT:    1. Chronic combined systolic and diastolic CHF (congestive heart failure) (HCC)   2. LBBB (left bundle branch block)   3. Atypical atrial flutter (HCC)   4. Acquired thrombophilia (HCC)   5. On amiodarone therapy   6. History of transcatheter aortic valve replacement (TAVR)   7. Situational hypertension   8. CKD (chronic kidney disease) stage 4, GFR 15-29 ml/min (HCC)   9. Aneurysm of ascending aorta without rupture (HCC)   10. PAD (peripheral artery disease) (HCC)   11. Urothelial cancer (HCC)         PLAN:  In order of problems listed above:  CHF: Euvolemic, NYHA functional class I, has gained true weight since he is feeling better and his new dry weight is probably around 193 pounds.  LV function has normalized consistent with resolution of tachycardia cardiomyopathy.  Has a prescription for "as needed" torsemide which she has not taken in weeks. LBBB: We have programmed his device for committed RV pacing since he has developed a broad LBBB and pacing provides a narrow QRS complex.  He is not a great candidate for upgrade to a CRT device due to renal dysfunction, although he  did very well with pre-TAVR contrast scans and angiography. AFlutter: Excellent suppression of atrial fibrillation with amiodarone.  CHA2DS2-VASc score 4 (age, HTN, CHF).  No history of strokes or other embolic events. Anticoagulation: On appropriate anticoagulation with Eliquis adjusted for renal function and age.  Needs to intermittently interrupt anticoagulant when he develops hematuria from his renal neoplasm.  Discussed Watchman device implantation with Dr. Lalla Brothers who felt that due to his CKD and other comorbid conditions it would not be a good choice. Amiodarone: Monitor liver and thyroid function tests every 6 months, promptly report any unexplained respiratory symptoms, be aware of drug interactions and photosensitivity. AS s/p TAVR: Normal prosthetic valve function with stable gradients.  He is aware of the need for endocarditis prophylaxis with dental procedures. HTN: Situational elevation. CKD4: Most recent creatinine was 3.2 which is similar to the value at the time of his TAVR, but it has been as low as 2.1 in the interim..  Due to nephrectomy (total L, partial R). Prevents use of usual guideline directed medical therapy for heart failure.  He does have chronic metabolic acidosis and takes sodium bicarbonate supplements. Asc Ao Aneurysm: This is relatively mild and stable at 4.6 cm.  He is not a candidate for elective surgery. Routine follow up not indicated. 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 Widespread urothelial cancer , his Urologist at [facility] is Dr. Sherryll Burger.  Recent follow-up in the oncology clinic.  He has completed 12 months of pembrolizumab and radiation therapy and is currently not on active treatment.  He is scheduled to return for scans in October.       Medication Adjustments/Labs and Tests Ordered: Current medicines are reviewed at length with the patient today.  Concerns regarding medicines are outlined above.  Medication changes, Labs and Tests  ordered today are listed in the Patient Instructions below. Patient Instructions  Medication Instructions:  No changes *If you need a refill on your cardiac medications before your next appointment, please call your pharmacy*   Lab Work: CMP, TSH- Please return for Blood Work in one month. No appointment needed, lab here at the office is open Monday-Friday from 8AM to 4PM and closed daily for lunch from 12:45-1:45.   If you have labs (blood work) drawn today and your tests are completely normal, you will receive your results only by: MyChart Message (if you have MyChart) OR A paper copy in the mail If you have any lab test that is abnormal or we need to change your treatment, we will call you to review the results.   Follow-Up: At [facility], you and your health needs are our priority.  As part of our continuing mission to provide you with exceptional heart care, we have created designated Provider Care Teams.  These Care Teams include your primary Cardiologist (physician) and Advanced Practice Providers (APPs -  Physician Assistants and Nurse Practitioners) who all work together to provide you with the care you need, when you need it.  We recommend signing up for the patient portal called "MyChart".  Sign up information is provided on this After Visit Summary.  MyChart is used to connect with patients for Virtual Visits (Telemedicine).  Patients are able to view lab/test results, encounter notes, upcoming appointments, etc.  Non-urgent messages can be sent to your provider as well.   To learn more about what you can do with MyChart, go to [email or web address].    Your next appointment:   1 year(s)  Provider:   Thurmon Fair, MD       Signed, Thurmon Fair, MD  10/10/2022 4:19 PM    [facility] [address] [place], [place], [place]  [address] Phone: [phone]; Fax: [phone]

## 2022-10-06 NOTE — Patient Instructions (Signed)
Medication Instructions:  No changes *If you need a refill on your cardiac medications before your next appointment, please call your pharmacy*   Lab Work: CMP, TSH- Please return for Blood Work in one month. No appointment needed, lab here at the office is open Monday-Friday from 8AM to 4PM and closed daily for lunch from 12:45-1:45.   If you have labs (blood work) drawn today and your tests are completely normal, you will receive your results only by: MyChart Message (if you have MyChart) OR A paper copy in the mail If you have any lab test that is abnormal or we need to change your treatment, we will call you to review the results.   Follow-Up: At [facility], you and your health needs are our priority.  As part of our continuing mission to provide you with exceptional heart care, we have created designated Provider Care Teams.  These Care Teams include your primary Cardiologist (physician) and Advanced Practice Providers (APPs -  Physician Assistants and Nurse Practitioners) who all work together to provide you with the care you need, when you need it.  We recommend signing up for the patient portal called "MyChart".  Sign up information is provided on this After Visit Summary.  MyChart is used to connect with patients for Virtual Visits (Telemedicine).  Patients are able to view lab/test results, encounter notes, upcoming appointments, etc.  Non-urgent messages can be sent to your provider as well.   To learn more about what you can do with MyChart, go to [email or web address].    Your next appointment:   1 year(s)  Provider:   Thurmon Fair, MD

## 2022-10-10 ENCOUNTER — Encounter: Payer: Self-pay | Admitting: Cardiovascular Disease

## 2022-10-20 DIAGNOSIS — M25512 Pain in left shoulder: Secondary | ICD-10-CM | POA: Diagnosis not present

## 2022-10-27 ENCOUNTER — Other Ambulatory Visit: Payer: Self-pay

## 2022-10-27 DIAGNOSIS — E611 Iron deficiency: Secondary | ICD-10-CM

## 2022-10-29 ENCOUNTER — Telehealth: Payer: Self-pay | Admitting: Cardiovascular Disease

## 2022-10-29 NOTE — Telephone Encounter (Signed)
Patient called back to speak with Dr. Royann Shivers or nurse

## 2022-10-29 NOTE — Telephone Encounter (Signed)
Spoke with patient and he is aware of lab results 

## 2022-10-31 ENCOUNTER — Other Ambulatory Visit: Payer: Self-pay | Admitting: Cardiology

## 2022-11-01 LAB — CBC
Hematocrit: 46.7 % (ref 37.5–51.0)
Hemoglobin: 15.4 g/dL (ref 13.0–17.7)
MCH: 33.4 pg — ABNORMAL HIGH (ref 26.6–33.0)
MCHC: 33 g/dL (ref 31.5–35.7)
MCV: 101 fL — ABNORMAL HIGH (ref 79–97)
Platelets: 87 10*3/uL — CL (ref 150–450)
RBC: 4.61 x10E6/uL (ref 4.14–5.80)
RDW: 13.1 % (ref 11.6–15.4)
WBC: 6 10*3/uL (ref 3.4–10.8)

## 2022-11-01 LAB — IRON,TIBC AND FERRITIN PANEL

## 2022-11-09 DIAGNOSIS — Z85828 Personal history of other malignant neoplasm of skin: Secondary | ICD-10-CM | POA: Diagnosis not present

## 2022-11-09 DIAGNOSIS — D692 Other nonthrombocytopenic purpura: Secondary | ICD-10-CM | POA: Diagnosis not present

## 2022-11-11 DIAGNOSIS — C661 Malignant neoplasm of right ureter: Secondary | ICD-10-CM | POA: Diagnosis not present

## 2022-11-11 DIAGNOSIS — C662 Malignant neoplasm of left ureter: Secondary | ICD-10-CM | POA: Diagnosis not present

## 2022-11-11 DIAGNOSIS — N184 Chronic kidney disease, stage 4 (severe): Secondary | ICD-10-CM | POA: Diagnosis not present

## 2022-11-11 DIAGNOSIS — C642 Malignant neoplasm of left kidney, except renal pelvis: Secondary | ICD-10-CM | POA: Diagnosis not present

## 2022-11-11 DIAGNOSIS — C641 Malignant neoplasm of right kidney, except renal pelvis: Secondary | ICD-10-CM | POA: Diagnosis not present

## 2022-11-11 DIAGNOSIS — Z79890 Hormone replacement therapy (postmenopausal): Secondary | ICD-10-CM | POA: Diagnosis not present

## 2022-11-11 DIAGNOSIS — E039 Hypothyroidism, unspecified: Secondary | ICD-10-CM | POA: Diagnosis not present

## 2022-11-11 DIAGNOSIS — I129 Hypertensive chronic kidney disease with stage 1 through stage 4 chronic kidney disease, or unspecified chronic kidney disease: Secondary | ICD-10-CM | POA: Diagnosis not present

## 2022-11-11 DIAGNOSIS — K219 Gastro-esophageal reflux disease without esophagitis: Secondary | ICD-10-CM | POA: Diagnosis not present

## 2022-11-11 DIAGNOSIS — I4891 Unspecified atrial fibrillation: Secondary | ICD-10-CM | POA: Diagnosis not present

## 2022-11-11 DIAGNOSIS — C678 Malignant neoplasm of overlapping sites of bladder: Secondary | ICD-10-CM | POA: Diagnosis not present

## 2022-11-15 DIAGNOSIS — Z936 Other artificial openings of urinary tract status: Secondary | ICD-10-CM | POA: Diagnosis not present

## 2022-11-17 DIAGNOSIS — Z905 Acquired absence of kidney: Secondary | ICD-10-CM | POA: Diagnosis not present

## 2022-11-17 DIAGNOSIS — I10 Essential (primary) hypertension: Secondary | ICD-10-CM | POA: Diagnosis not present

## 2022-11-17 DIAGNOSIS — N184 Chronic kidney disease, stage 4 (severe): Secondary | ICD-10-CM | POA: Diagnosis not present

## 2022-11-22 DIAGNOSIS — M25512 Pain in left shoulder: Secondary | ICD-10-CM | POA: Diagnosis not present

## 2022-11-23 ENCOUNTER — Ambulatory Visit (INDEPENDENT_AMBULATORY_CARE_PROVIDER_SITE_OTHER): Payer: Medicare PPO

## 2022-11-23 DIAGNOSIS — I495 Sick sinus syndrome: Secondary | ICD-10-CM

## 2022-11-24 LAB — CUP PACEART REMOTE DEVICE CHECK
Battery Remaining Longevity: 144 mo
Battery Remaining Percentage: 100 %
Brady Statistic RA Percent Paced: 9 %
Brady Statistic RV Percent Paced: 99 %
Date Time Interrogation Session: 20241029045400
Implantable Lead Connection Status: 753985
Implantable Lead Connection Status: 753985
Implantable Lead Implant Date: 20230123
Implantable Lead Implant Date: 20230123
Implantable Lead Location: 753859
Implantable Lead Location: 753860
Implantable Lead Model: 7841
Implantable Lead Model: 7842
Implantable Lead Serial Number: 1101059
Implantable Lead Serial Number: 1169597
Implantable Pulse Generator Implant Date: 20230123
Lead Channel Impedance Value: 525 Ohm
Lead Channel Impedance Value: 678 Ohm
Lead Channel Pacing Threshold Amplitude: 0.6 V
Lead Channel Pacing Threshold Amplitude: 0.9 V
Lead Channel Pacing Threshold Pulse Width: 0.4 ms
Lead Channel Pacing Threshold Pulse Width: 0.4 ms
Lead Channel Setting Pacing Amplitude: 2.5 V
Lead Channel Setting Pacing Amplitude: 2.5 V
Lead Channel Setting Pacing Pulse Width: 0.4 ms
Lead Channel Setting Sensing Sensitivity: 2.5 mV
Pulse Gen Serial Number: 561085
Zone Setting Status: 755011

## 2022-12-14 NOTE — Progress Notes (Signed)
Remote pacemaker transmission.   

## 2022-12-27 DIAGNOSIS — H43393 Other vitreous opacities, bilateral: Secondary | ICD-10-CM | POA: Diagnosis not present

## 2022-12-27 DIAGNOSIS — H353122 Nonexudative age-related macular degeneration, left eye, intermediate dry stage: Secondary | ICD-10-CM | POA: Diagnosis not present

## 2022-12-27 DIAGNOSIS — H31092 Other chorioretinal scars, left eye: Secondary | ICD-10-CM | POA: Diagnosis not present

## 2022-12-27 DIAGNOSIS — H35033 Hypertensive retinopathy, bilateral: Secondary | ICD-10-CM | POA: Diagnosis not present

## 2022-12-27 DIAGNOSIS — D3131 Benign neoplasm of right choroid: Secondary | ICD-10-CM | POA: Diagnosis not present

## 2022-12-27 DIAGNOSIS — H353211 Exudative age-related macular degeneration, right eye, with active choroidal neovascularization: Secondary | ICD-10-CM | POA: Diagnosis not present

## 2022-12-27 DIAGNOSIS — H43813 Vitreous degeneration, bilateral: Secondary | ICD-10-CM | POA: Diagnosis not present

## 2023-01-17 DIAGNOSIS — Z936 Other artificial openings of urinary tract status: Secondary | ICD-10-CM | POA: Diagnosis not present

## 2023-01-31 ENCOUNTER — Telehealth: Payer: Self-pay | Admitting: Cardiology

## 2023-01-31 NOTE — Telephone Encounter (Signed)
 Pt called in stating he has blood in his urine and asked what he should do about his eliquis, please advise.

## 2023-01-31 NOTE — Telephone Encounter (Signed)
 Patient states that he has had blood in his urine for about 5 days now. He states that he feels fine and is not having any symptoms. Reports urine is dark. He has been in contact with his PCP who did a urinalysis this morning. He is still waiting for those results. He has a history of bladder cancer and has also reached out to his oncologist. They have encouraged him to increase his intake of fluids and are going to follow closely. He states that they advised him to call us  since he is on Eliquis . He is currently taking 2.5 mg twice daily. He was advised to call us  to see if it would be safe for him to trial holding his Eliquis  to see if there was any improvement in hematuria. Advised I would send a message to Dr. Cindie for recommendations.

## 2023-02-01 NOTE — Telephone Encounter (Signed)
 Spoke with the patient and gave advisement from Dr. Lalla Brothers. Patient verbalized understanding.

## 2023-02-10 DIAGNOSIS — C678 Malignant neoplasm of overlapping sites of bladder: Secondary | ICD-10-CM | POA: Diagnosis not present

## 2023-02-10 DIAGNOSIS — R310 Gross hematuria: Secondary | ICD-10-CM | POA: Diagnosis not present

## 2023-02-10 DIAGNOSIS — C661 Malignant neoplasm of right ureter: Secondary | ICD-10-CM | POA: Diagnosis not present

## 2023-02-17 ENCOUNTER — Telehealth: Payer: Self-pay | Admitting: Cardiovascular Disease

## 2023-02-17 ENCOUNTER — Other Ambulatory Visit: Payer: Self-pay | Admitting: Cardiovascular Disease

## 2023-02-17 DIAGNOSIS — E611 Iron deficiency: Secondary | ICD-10-CM

## 2023-02-17 MED ORDER — FERROUS SULFATE 325 (65 FE) MG PO TBEC: 325.0000 mg | DELAYED_RELEASE_TABLET | Freq: Two times a day (BID) | ORAL | 3 refills | Status: DC

## 2023-02-17 NOTE — Telephone Encounter (Signed)
Pt's pharmacy is requesting a refill on ferrous sulfate. Would Dr. Royann Shivers like to refill this medication? Please address

## 2023-02-17 NOTE — Telephone Encounter (Signed)
Prescription sent- see other encounter

## 2023-02-17 NOTE — Telephone Encounter (Signed)
*  STAT* If patient is at the pharmacy, call can be transferred to refill team.   1. Which medications need to be refilled? (please list name of each medication and dose if known) ferrous sulfate 325 (65 FE) MG EC tablet   2. Which pharmacy/location (including street and city if local pharmacy) is medication to be sent to? [id number] - [place], [place] - [address]   3. Do they need a 30 day or 90 day supply? 90

## 2023-02-21 DIAGNOSIS — D638 Anemia in other chronic diseases classified elsewhere: Secondary | ICD-10-CM | POA: Diagnosis not present

## 2023-02-21 DIAGNOSIS — R7301 Impaired fasting glucose: Secondary | ICD-10-CM | POA: Diagnosis not present

## 2023-02-21 DIAGNOSIS — N184 Chronic kidney disease, stage 4 (severe): Secondary | ICD-10-CM | POA: Diagnosis not present

## 2023-02-21 DIAGNOSIS — E785 Hyperlipidemia, unspecified: Secondary | ICD-10-CM | POA: Diagnosis not present

## 2023-02-21 DIAGNOSIS — D649 Anemia, unspecified: Secondary | ICD-10-CM | POA: Diagnosis not present

## 2023-02-21 DIAGNOSIS — I129 Hypertensive chronic kidney disease with stage 1 through stage 4 chronic kidney disease, or unspecified chronic kidney disease: Secondary | ICD-10-CM | POA: Diagnosis not present

## 2023-02-22 ENCOUNTER — Ambulatory Visit (INDEPENDENT_AMBULATORY_CARE_PROVIDER_SITE_OTHER): Payer: Medicare PPO

## 2023-02-22 ENCOUNTER — Encounter: Payer: Self-pay | Admitting: Cardiology

## 2023-02-22 DIAGNOSIS — I495 Sick sinus syndrome: Secondary | ICD-10-CM | POA: Diagnosis not present

## 2023-02-22 DIAGNOSIS — M79674 Pain in right toe(s): Secondary | ICD-10-CM | POA: Diagnosis not present

## 2023-02-22 LAB — CUP PACEART REMOTE DEVICE CHECK
Battery Remaining Longevity: 144 mo
Battery Remaining Percentage: 100 %
Brady Statistic RA Percent Paced: 11 %
Brady Statistic RV Percent Paced: 99 %
Date Time Interrogation Session: 20250128044100
Implantable Lead Connection Status: 753985
Implantable Lead Connection Status: 753985
Implantable Lead Implant Date: 20230123
Implantable Lead Implant Date: 20230123
Implantable Lead Location: 753859
Implantable Lead Location: 753860
Implantable Lead Model: 7841
Implantable Lead Model: 7842
Implantable Lead Serial Number: 1101059
Implantable Lead Serial Number: 1169597
Implantable Pulse Generator Implant Date: 20230123
Lead Channel Impedance Value: 515 Ohm
Lead Channel Impedance Value: 731 Ohm
Lead Channel Pacing Threshold Amplitude: 1 V
Lead Channel Pacing Threshold Pulse Width: 0.4 ms
Lead Channel Setting Pacing Amplitude: 2.5 V
Lead Channel Setting Pacing Amplitude: 2.5 V
Lead Channel Setting Pacing Pulse Width: 0.4 ms
Lead Channel Setting Sensing Sensitivity: 2.5 mV
Pulse Gen Serial Number: 561085
Zone Setting Status: 755011

## 2023-02-23 ENCOUNTER — Other Ambulatory Visit: Payer: Self-pay | Admitting: Physician Assistant

## 2023-02-25 ENCOUNTER — Encounter (HOSPITAL_COMMUNITY): Payer: Self-pay | Admitting: Orthopaedic Surgery

## 2023-02-25 ENCOUNTER — Other Ambulatory Visit (HOSPITAL_COMMUNITY): Payer: Self-pay | Admitting: Orthopaedic Surgery

## 2023-02-25 ENCOUNTER — Encounter (HOSPITAL_COMMUNITY): Payer: Self-pay

## 2023-02-25 ENCOUNTER — Telehealth: Payer: Self-pay | Admitting: Cardiology

## 2023-02-25 DIAGNOSIS — L03115 Cellulitis of right lower limb: Secondary | ICD-10-CM

## 2023-02-25 NOTE — Telephone Encounter (Signed)
*  STAT* If patient is at the pharmacy, call can be transferred to refill team.   1. Which medications need to be refilled? (please list name of each medication and dose if known)   ELIQUIS 2.5 MG TABS tablet     2. Would you like to learn more about the convenience, safety, & potential cost savings by using the [facility]? N/A   3. Are you open to using the [facility] ([facility]. N/A   4. Which pharmacy/location (including street and city if local pharmacy) is medication to be sent to?  [facility] [address], [place] - [address]     5. Do they need a 30 day or 90 day supply? 90 day  Has an appt scheduled 02/11 with Mardelle Matte. Has enough meds to last until then, but felt he would forget to request the refill if he waited until the appt to ask.

## 2023-02-28 MED ORDER — ELIQUIS 2.5 MG PO TABS: 2.5000 mg | ORAL_TABLET | Freq: Two times a day (BID) | ORAL | 1 refills | Status: DC

## 2023-02-28 NOTE — Telephone Encounter (Signed)
Prescription refill request for Eliquis received. Indication: PAF Last office visit: 10/06/22  Judie Petit Croitoru MD Scr: 3.2 on 11/11/22  Epic Age: 83 Weight: 87.5kg  Based on above findings Eliquis 2.5mg  twice daily is the appropriate dose.  Refill approved.

## 2023-03-01 ENCOUNTER — Ambulatory Visit (HOSPITAL_COMMUNITY)
Admission: RE | Admit: 2023-03-01 | Discharge: 2023-03-01 | Disposition: A | Discharge status: Home / Self Care | Payer: Medicare PPO | Source: Ambulatory Visit | Attending: Orthopaedic Surgery | Admitting: Orthopaedic Surgery

## 2023-03-01 ENCOUNTER — Inpatient Hospital Stay (HOSPITAL_COMMUNITY)
Admit: 2023-03-01 | Discharge: 2023-03-12 | DRG: 503 | Disposition: A | Discharge status: Home Health | Payer: Medicare PPO | Attending: Internal Medicine | Admitting: Internal Medicine

## 2023-03-01 DIAGNOSIS — I77810 Thoracic aortic ectasia: Secondary | ICD-10-CM | POA: Diagnosis present

## 2023-03-01 DIAGNOSIS — Z950 Presence of cardiac pacemaker: Secondary | ICD-10-CM | POA: Diagnosis not present

## 2023-03-01 DIAGNOSIS — I4892 Unspecified atrial flutter: Secondary | ICD-10-CM | POA: Diagnosis not present

## 2023-03-01 DIAGNOSIS — E872 Acidosis, unspecified: Secondary | ICD-10-CM | POA: Diagnosis not present

## 2023-03-01 DIAGNOSIS — Z881 Allergy status to other antibiotic agents status: Secondary | ICD-10-CM

## 2023-03-01 DIAGNOSIS — I2510 Atherosclerotic heart disease of native coronary artery without angina pectoris: Secondary | ICD-10-CM | POA: Diagnosis present

## 2023-03-01 DIAGNOSIS — N184 Chronic kidney disease, stage 4 (severe): Secondary | ICD-10-CM | POA: Diagnosis present

## 2023-03-01 DIAGNOSIS — Z8719 Personal history of other diseases of the digestive system: Secondary | ICD-10-CM

## 2023-03-01 DIAGNOSIS — M86171 Other acute osteomyelitis, right ankle and foot: Secondary | ICD-10-CM | POA: Diagnosis not present

## 2023-03-01 DIAGNOSIS — Z803 Family history of malignant neoplasm of breast: Secondary | ICD-10-CM

## 2023-03-01 DIAGNOSIS — Z906 Acquired absence of other parts of urinary tract: Secondary | ICD-10-CM

## 2023-03-01 DIAGNOSIS — M868X7 Other osteomyelitis, ankle and foot: Secondary | ICD-10-CM | POA: Diagnosis not present

## 2023-03-01 DIAGNOSIS — C689 Malignant neoplasm of urinary organ, unspecified: Secondary | ICD-10-CM | POA: Diagnosis not present

## 2023-03-01 DIAGNOSIS — I495 Sick sinus syndrome: Secondary | ICD-10-CM | POA: Diagnosis present

## 2023-03-01 DIAGNOSIS — N400 Enlarged prostate without lower urinary tract symptoms: Secondary | ICD-10-CM | POA: Diagnosis present

## 2023-03-01 DIAGNOSIS — Z9079 Acquired absence of other genital organ(s): Secondary | ICD-10-CM

## 2023-03-01 DIAGNOSIS — Z952 Presence of prosthetic heart valve: Secondary | ICD-10-CM | POA: Diagnosis not present

## 2023-03-01 DIAGNOSIS — R0600 Dyspnea, unspecified: Secondary | ICD-10-CM | POA: Diagnosis not present

## 2023-03-01 DIAGNOSIS — L089 Local infection of the skin and subcutaneous tissue, unspecified: Secondary | ICD-10-CM | POA: Diagnosis not present

## 2023-03-01 DIAGNOSIS — Z8379 Family history of other diseases of the digestive system: Secondary | ICD-10-CM

## 2023-03-01 DIAGNOSIS — E039 Hypothyroidism, unspecified: Secondary | ICD-10-CM

## 2023-03-01 DIAGNOSIS — Z8554 Personal history of malignant neoplasm of ureter: Secondary | ICD-10-CM

## 2023-03-01 DIAGNOSIS — E785 Hyperlipidemia, unspecified: Secondary | ICD-10-CM | POA: Diagnosis present

## 2023-03-01 DIAGNOSIS — Z7982 Long term (current) use of aspirin: Secondary | ICD-10-CM

## 2023-03-01 DIAGNOSIS — N186 End stage renal disease: Secondary | ICD-10-CM | POA: Diagnosis not present

## 2023-03-01 DIAGNOSIS — I214 Non-ST elevation (NSTEMI) myocardial infarction: Secondary | ICD-10-CM

## 2023-03-01 DIAGNOSIS — M19072 Primary osteoarthritis, left ankle and foot: Secondary | ICD-10-CM | POA: Diagnosis not present

## 2023-03-01 DIAGNOSIS — R0609 Other forms of dyspnea: Secondary | ICD-10-CM | POA: Diagnosis not present

## 2023-03-01 DIAGNOSIS — I130 Hypertensive heart and chronic kidney disease with heart failure and stage 1 through stage 4 chronic kidney disease, or unspecified chronic kidney disease: Secondary | ICD-10-CM | POA: Diagnosis not present

## 2023-03-01 DIAGNOSIS — L03115 Cellulitis of right lower limb: Secondary | ICD-10-CM | POA: Diagnosis not present

## 2023-03-01 DIAGNOSIS — E538 Deficiency of other specified B group vitamins: Secondary | ICD-10-CM | POA: Diagnosis present

## 2023-03-01 DIAGNOSIS — I10 Essential (primary) hypertension: Secondary | ICD-10-CM

## 2023-03-01 DIAGNOSIS — Z936 Other artificial openings of urinary tract status: Secondary | ICD-10-CM | POA: Diagnosis not present

## 2023-03-01 DIAGNOSIS — N179 Acute kidney failure, unspecified: Secondary | ICD-10-CM | POA: Diagnosis not present

## 2023-03-01 DIAGNOSIS — K219 Gastro-esophageal reflux disease without esophagitis: Secondary | ICD-10-CM | POA: Diagnosis present

## 2023-03-01 DIAGNOSIS — M109 Gout, unspecified: Secondary | ICD-10-CM | POA: Diagnosis present

## 2023-03-01 DIAGNOSIS — R4182 Altered mental status, unspecified: Secondary | ICD-10-CM | POA: Diagnosis not present

## 2023-03-01 DIAGNOSIS — Z9049 Acquired absence of other specified parts of digestive tract: Secondary | ICD-10-CM

## 2023-03-01 DIAGNOSIS — I5042 Chronic combined systolic (congestive) and diastolic (congestive) heart failure: Secondary | ICD-10-CM

## 2023-03-01 DIAGNOSIS — I480 Paroxysmal atrial fibrillation: Secondary | ICD-10-CM | POA: Diagnosis present

## 2023-03-01 DIAGNOSIS — D696 Thrombocytopenia, unspecified: Secondary | ICD-10-CM | POA: Diagnosis present

## 2023-03-01 DIAGNOSIS — I5023 Acute on chronic systolic (congestive) heart failure: Secondary | ICD-10-CM | POA: Diagnosis not present

## 2023-03-01 DIAGNOSIS — Z79899 Other long term (current) drug therapy: Secondary | ICD-10-CM

## 2023-03-01 DIAGNOSIS — Z79890 Hormone replacement therapy (postmenopausal): Secondary | ICD-10-CM

## 2023-03-01 DIAGNOSIS — D72819 Decreased white blood cell count, unspecified: Secondary | ICD-10-CM | POA: Diagnosis not present

## 2023-03-01 DIAGNOSIS — Z85528 Personal history of other malignant neoplasm of kidney: Secondary | ICD-10-CM

## 2023-03-01 DIAGNOSIS — I6523 Occlusion and stenosis of bilateral carotid arteries: Secondary | ICD-10-CM | POA: Diagnosis not present

## 2023-03-01 DIAGNOSIS — I200 Unstable angina: Secondary | ICD-10-CM | POA: Diagnosis not present

## 2023-03-01 DIAGNOSIS — G4700 Insomnia, unspecified: Secondary | ICD-10-CM | POA: Diagnosis present

## 2023-03-01 DIAGNOSIS — Z923 Personal history of irradiation: Secondary | ICD-10-CM

## 2023-03-01 DIAGNOSIS — M7732 Calcaneal spur, left foot: Secondary | ICD-10-CM | POA: Diagnosis not present

## 2023-03-01 DIAGNOSIS — M869 Osteomyelitis, unspecified: Principal | ICD-10-CM

## 2023-03-01 DIAGNOSIS — K0889 Other specified disorders of teeth and supporting structures: Secondary | ICD-10-CM | POA: Diagnosis present

## 2023-03-01 DIAGNOSIS — Z860100 Personal history of colon polyps, unspecified: Secondary | ICD-10-CM

## 2023-03-01 DIAGNOSIS — Z8041 Family history of malignant neoplasm of ovary: Secondary | ICD-10-CM

## 2023-03-01 DIAGNOSIS — M25461 Effusion, right knee: Secondary | ICD-10-CM | POA: Diagnosis not present

## 2023-03-01 DIAGNOSIS — Z8551 Personal history of malignant neoplasm of bladder: Secondary | ICD-10-CM

## 2023-03-01 DIAGNOSIS — Z9221 Personal history of antineoplastic chemotherapy: Secondary | ICD-10-CM

## 2023-03-01 DIAGNOSIS — Z880 Allergy status to penicillin: Secondary | ICD-10-CM

## 2023-03-01 DIAGNOSIS — M85861 Other specified disorders of bone density and structure, right lower leg: Secondary | ICD-10-CM | POA: Diagnosis not present

## 2023-03-01 DIAGNOSIS — Z800 Family history of malignant neoplasm of digestive organs: Secondary | ICD-10-CM

## 2023-03-01 DIAGNOSIS — Z888 Allergy status to other drugs, medicaments and biological substances status: Secondary | ICD-10-CM

## 2023-03-01 DIAGNOSIS — M1711 Unilateral primary osteoarthritis, right knee: Secondary | ICD-10-CM | POA: Diagnosis not present

## 2023-03-01 DIAGNOSIS — Z452 Encounter for adjustment and management of vascular access device: Secondary | ICD-10-CM | POA: Diagnosis not present

## 2023-03-01 DIAGNOSIS — Z9889 Other specified postprocedural states: Secondary | ICD-10-CM

## 2023-03-01 DIAGNOSIS — I132 Hypertensive heart and chronic kidney disease with heart failure and with stage 5 chronic kidney disease, or end stage renal disease: Secondary | ICD-10-CM | POA: Diagnosis not present

## 2023-03-01 DIAGNOSIS — L03031 Cellulitis of right toe: Secondary | ICD-10-CM | POA: Diagnosis not present

## 2023-03-01 DIAGNOSIS — I350 Nonrheumatic aortic (valve) stenosis: Secondary | ICD-10-CM | POA: Diagnosis present

## 2023-03-01 DIAGNOSIS — Z905 Acquired absence of kidney: Secondary | ICD-10-CM

## 2023-03-01 DIAGNOSIS — Z7901 Long term (current) use of anticoagulants: Secondary | ICD-10-CM

## 2023-03-01 HISTORY — DX: Presence of cardiac pacemaker: Z95.0

## 2023-03-01 MED ORDER — ACETAMINOPHEN 650 MG RE SUPP: 650.0000 mg | Freq: Four times a day (QID) | RECTAL | Status: DC | PRN

## 2023-03-01 MED ORDER — CARVEDILOL 12.5 MG PO TABS
12.5000 mg | ORAL_TABLET | Freq: Two times a day (BID) | ORAL | Status: DC
  Administered 2023-03-01 – 2023-03-12 (×22): 12.5 mg via ORAL
  Filled 2023-03-01 (×22): qty 1

## 2023-03-01 MED ORDER — LEVOTHYROXINE SODIUM 75 MCG PO TABS
75.0000 ug | ORAL_TABLET | Freq: Every day | ORAL | Status: DC
  Administered 2023-03-02 – 2023-03-12 (×11): 75 ug via ORAL
  Filled 2023-03-01 (×11): qty 1

## 2023-03-01 MED ORDER — ONDANSETRON HCL 4 MG PO TABS: 4.0000 mg | ORAL_TABLET | Freq: Four times a day (QID) | ORAL | Status: DC | PRN

## 2023-03-01 MED ORDER — SODIUM CHLORIDE 0.9% FLUSH
3.0000 mL | Freq: Two times a day (BID) | INTRAVENOUS | Status: DC
  Administered 2023-03-02 – 2023-03-11 (×20): 3 mL via INTRAVENOUS

## 2023-03-01 MED ORDER — ACETAMINOPHEN 325 MG PO TABS
650.0000 mg | ORAL_TABLET | Freq: Four times a day (QID) | ORAL | Status: DC | PRN
  Administered 2023-03-03 – 2023-03-09 (×7): 650 mg via ORAL
  Filled 2023-03-01 (×7): qty 2

## 2023-03-01 MED ORDER — AMIODARONE HCL 200 MG PO TABS
200.0000 mg | ORAL_TABLET | Freq: Every day | ORAL | Status: DC
  Administered 2023-03-02 – 2023-03-12 (×11): 200 mg via ORAL
  Filled 2023-03-01 (×11): qty 1

## 2023-03-01 MED ORDER — ALPRAZOLAM 0.25 MG PO TABS
0.2500 mg | ORAL_TABLET | Freq: Every day | ORAL | Status: DC
  Administered 2023-03-01 – 2023-03-11 (×11): 0.25 mg via ORAL
  Filled 2023-03-01 (×11): qty 1

## 2023-03-01 MED ORDER — SENNOSIDES-DOCUSATE SODIUM 8.6-50 MG PO TABS
1.0000 | ORAL_TABLET | Freq: Every evening | ORAL | Status: DC | PRN
  Administered 2023-03-05 – 2023-03-09 (×2): 1 via ORAL
  Filled 2023-03-01 (×2): qty 1

## 2023-03-01 MED ORDER — SODIUM BICARBONATE 650 MG PO TABS
1300.0000 mg | ORAL_TABLET | Freq: Two times a day (BID) | ORAL | Status: DC
  Administered 2023-03-01 – 2023-03-02 (×3): 1300 mg via ORAL
  Filled 2023-03-01 (×3): qty 2

## 2023-03-01 MED ORDER — ONDANSETRON HCL 4 MG/2ML IJ SOLN: 4.0000 mg | Freq: Four times a day (QID) | INTRAMUSCULAR | Status: DC | PRN

## 2023-03-01 MED ORDER — BISACODYL 5 MG PO TBEC
5.0000 mg | DELAYED_RELEASE_TABLET | Freq: Every day | ORAL | Status: DC | PRN
  Administered 2023-03-08: 5 mg via ORAL
  Filled 2023-03-01: qty 1

## 2023-03-01 MED ORDER — MIRTAZAPINE 15 MG PO TABS
7.5000 mg | ORAL_TABLET | Freq: Every day | ORAL | Status: DC
  Administered 2023-03-01 – 2023-03-11 (×11): 7.5 mg via ORAL
  Filled 2023-03-01 (×12): qty 1

## 2023-03-01 NOTE — Care Plan (Signed)
 Orthopaedic Surgery Plan of Care Note   -history and imaging reviewed with patient by telephone today -pt has right forefoot cellulitis with 4th toe proximal phalanx osteomyelitis on XR and today's MRI -PMH extensive and complex, appreciate hospitalist colleagues' care -please keep NPO and hold VTE ppx from [place] -OR tomorrow Wed 03/02/23 -full consult note to follow -discussed with patient who understands and is in agreement with the plan   Jordan Mountain, MD Orthopaedic Surgery [facility]

## 2023-03-01 NOTE — H&P (Signed)
 History and Physical    Jordan Oconnell FMW:[id number] DOB: 22-Jun-1940 DOA: 03/01/2023  PCP: Shepard Ade, MD  Patient coming from: Home  I have personally briefly reviewed patient's old medical records in [facility]  Chief Complaint: Right fourth toe infection  HPI: Jordan Oconnell is a 83 y.o. male with medical history significant for PAF on Eliquis , AS s/p TAVR, tachy-brady syndrome s/p PPM CKD stage IV, urothelial cancer s/p multiple TURBTs with urostomy in place, RCC s/p left nephrectomy with recurrent urothelial disease to solitary right renal pelvis, hypothyroidism who was directly admitted for management of right fourth toe osteomyelitis.  Patient directly admitted per orthopedics, Dr. Barton, for management of osteomyelitis of the right fourth toe.  Plan is for amputation tomorrow 03/01/23.  Patient denies any fevers, chills, diaphoresis, chest pain, dyspnea.  He reports good output from his urostomy.  He is on chronic anticoagulation with Eliquis .  He says he last took Eliquis  this morning 2/4.  He says he has been taking Keflex for the last 2 days.  Review of Systems: All systems reviewed and are negative except as documented in history of present illness above.   Past Medical History:  Diagnosis Date   Bladder cancer (HCC)    BPH (benign prostatic hyperplasia)    CKD (chronic kidney disease), stage IV (HCC)    Colon polyps    Diverticulosis    GERD (gastroesophageal reflux disease)    Hyperlipidemia    pt denies   Hypertension    PAF (paroxysmal atrial fibrillation) (HCC)    on Flecanide and diltiazem . 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 Wonda and Dr. Lucas   Severe aortic stenosis 10/05/2020    Past Surgical History:  Procedure Laterality Date   APPENDECTOMY     BLADDER SURGERY   2008   transurethral resection/resection of prostatic urethra   BOWEL RESECTION     CARDIOVERSION N/A 03/03/2022   Procedure: CARDIOVERSION;  Surgeon: Lonni Slain, MD;  Location: [facility] ENDOSCOPY;  Service: Cardiovascular;  Laterality: N/A;   CARDIOVERSION N/A 03/12/2022   Procedure: CARDIOVERSION;  Surgeon: Sheena Pugh, DO;  Location: [facility] ENDOSCOPY;  Service: Cardiovascular;  Laterality: N/A;   ESOPHAGOGASTRODUODENOSCOPY N/A 07/24/2013   Procedure: ESOPHAGOGASTRODUODENOSCOPY (EGD);  Surgeon: Norleen LOISE Kiang, MD;  Location: [facility];  Service: Endoscopy;  Laterality: N/A;   INTRAOPERATIVE TRANSTHORACIC ECHOCARDIOGRAM N/A 01/27/2021   Procedure: INTRAOPERATIVE TRANSTHORACIC ECHOCARDIOGRAM;  Surgeon: Wonda Sharper, MD;  Location: [facility] INVASIVE CV LAB;  Service: Open Heart Surgery;  Laterality: N/A;   laparoscopic surgery  2012   laser   (?)   NEPHRECTOMY  2007   partial, left   NEPHROSTOMY  2011   stent   NM MYOCAR PERF WALL MOTION  09/08/2010   Normal   PACEMAKER IMPLANT N/A 02/12/2021   Procedure: PACEMAKER IMPLANT;  Surgeon: Cindie Ole DASEN, MD;  Location: [facility] INVASIVE CV LAB;  Service: Cardiovascular;  Laterality: N/A;   RIGHT HEART CATH AND CORONARY ANGIOGRAPHY N/A 10/06/2020   Procedure: RIGHT HEART CATH AND CORONARY ANGIOGRAPHY;  Surgeon: Wonda Sharper, MD;  Location: [facility] INVASIVE CV LAB;  Service: Cardiovascular;  Laterality: N/A;   ROBOT ASSISTED LAPAROSCOPIC COMPLETE CYSTECT ILEAL CONDUIT     TRANSCATHETER AORTIC VALVE REPLACEMENT, TRANSFEMORAL Right 01/27/2021   Procedure: TRANSCATHETER AORTIC VALVE REPLACEMENT, TRANSFEMORAL;  Surgeon: Wonda,  Ozell, MD;  Location: [facility] INVASIVE CV LAB;  Service: Open Heart Surgery;  Laterality: Right;   US  ECHOCARDIOGRAPHY  09/08/2010   mild diastolic dysfunction,mild dilated LA,mild MR,AI,mildly dilated aortic root    Social History:  reports that he has never smoked. He has never used smokeless tobacco. He reports current alcohol  use. He  reports that he does not use drugs.  Allergies  Allergen Reactions   Amoxicillin Other (See Comments)    Acute interstitial nephritis  Other Reaction(s): Not available   Tamsulosin     Other Reaction(s): Not available   Doxycycline Hives    Other Reaction(s): Not available   Flomax [Tamsulosin Hcl] Other (See Comments)    Dizzy     Family History  Problem Relation Age of Onset   Ovarian cancer Mother    Colon cancer Mother    Colon cancer Father    Crohn's disease Son    Breast cancer Daughter    Colon cancer Brother      Prior to Admission medications   Medication Sig Start Date End Date Taking? Authorizing Provider  acetaminophen  (TYLENOL ) 500 MG tablet Take 1,000-1,500 mg by mouth 2 (two) times daily as needed for moderate pain (pain score 4-6).    [provider]  ALPRAZolam  (XANAX ) 0.25 MG tablet Take 0.25 mg by mouth at bedtime.    [provider]  amiodarone  (PACERONE ) 200 MG tablet Take 1 tablet (200 mg total) by mouth daily. 11/03/22   Cindie Ole DASEN, MD  azithromycin  (ZITHROMAX ) 500 MG tablet TAKE 1 TABLET BY MOUTH ONCE DAILY 1  HOUR  BEFORE  DENTAL  PROCEDURES  AND  CLEANINGS 02/23/23   Croitoru, Mihai, MD  carvedilol  (COREG ) 12.5 MG tablet Take 12.5 mg by mouth 2 (two) times daily.    [provider]  cephALEXin (KEFLEX) 500 MG capsule Take 500 mg by mouth every 6 (six) hours. 02/25/23   [provider]  ELIQUIS  2.5 MG TABS tablet Take 1 tablet (2.5 mg total) by mouth 2 (two) times daily. 02/28/23   Croitoru, Mihai, MD  ferrous sulfate  325 (65 FE) MG EC tablet Take 1 tablet (325 mg total) by mouth in the morning and at bedtime. 02/17/23   Croitoru, Mihai, MD  levothyroxine  (SYNTHROID ) 75 MCG tablet Take 75 mcg by mouth daily before breakfast.    [provider]  mirtazapine  (REMERON ) 15 MG tablet Take 7.5 mg by mouth at bedtime. 08/09/20   [provider]  sodium bicarbonate  650 MG tablet Take 1,300 mg by mouth 2  (two) times daily. 09/01/20   [provider]  hydrALAZINE  (APRESOLINE ) 50 MG tablet Take 50 mg by mouth 2 (two) times daily. Patient not taking: Reported on 03/01/2022  03/01/22  [provider]    Physical Exam: Vitals:   03/01/23 2100  BP: (!) 150/95  Pulse: 90  Resp: 16  Temp: 98.4 F (36.9 C)  SpO2: 99%   Constitutional: Resting in bed, NAD, calm, comfortable Eyes: EOMI, lids and conjunctivae normal ENMT: Mucous membranes are moist. Posterior pharynx clear of any exudate or lesions.Normal dentition.  Neck: normal, supple, no masses. Respiratory: clear to auscultation bilaterally, no wheezing, no crackles. Normal respiratory effort. No accessory muscle use.  Cardiovascular: Regular rate and rhythm, no murmurs / rubs / gallops. No extremity edema. 2+ pedal pulses.  PPM in place left chest wall. Abdomen: no tenderness, left abdominal hernia.  Urostomy in place. Musculoskeletal: Swelling right fourth toe.  Good ROM. Skin: Swelling and mild  erythema right fourth toe Neurologic: Sensation intact. Strength 5/5 in all 4.  Psychiatric: Normal judgment and insight. Alert and oriented x 3. Normal mood.   EKG: Ordered and pending.  Assessment/Plan Principal Problem:   Osteomyelitis of fourth toe of right foot (HCC) Active Problems:   Urothelial cancer (HCC)   PAF (paroxysmal atrial fibrillation) (HCC)   CKD (chronic kidney disease) stage 4, GFR 15-29 ml/min (HCC)   S/P TAVR (transcatheter aortic valve replacement)   Chronic combined systolic and diastolic CHF (congestive heart failure) (HCC)   Hypothyroidism   Jordan Oconnell is a 83 y.o. male with medical history significant for PAF on Eliquis , AS s/p TAVR, tachy-brady syndrome s/p PPM CKD stage IV, urothelial cancer s/p multiple TURBTs with urostomy in place, RCC s/p left nephrectomy with recurrent urothelial disease to solitary right renal pelvis, hypothyroidism who is admitted with osteomyelitis of the right fourth  toe.  Assessment and Plan: Osteomyelitis of right fourth toe: Direct admission per orthopedics Dr. Ramadan.  Plan is for fourth toe amputation tomorrow 03/01/2023.  Patient is hemodynamically stable.  Will hold antibiotics pending surgical intervention.  Holding Eliquis , n.p.o. after midnight.  CKD stage IV: Recent baseline creatinine appears to be around 3.2.  Labs pending.  Continue oral sodium bicarb.  Urothelial cancer s/p urostomy and RCC s/p left nephrectomy with recurrent disease to solitary right kidney: Has multifocal urothelial disease.  Follows with [facility], oncology, nephrology.  On active surveillance after most recent treatment being radiation therapy and 12 months of pembrolizumab .  Continue urostomy care.  Paroxysmal atrial fibrillation/flutter Tachy-brady syndrome s/p PPM: -Holding Eliquis  -Continue Coreg  12.5 mg twice daily -Continue amiodarone  200 mg daily  Chronic combined systolic and diastolic CHF: Last EF improved to 50-55% (09/22/2022) compared to prior 30-35% (06/02/2022).  Appears euvolemic on admission.  Not on diuretics as an outpatient.  Continue Coreg .  Hypothyroidism: Continue Synthroid .  Severe aortic stenosis s/p TAVR 01/2021: Stable on last echo 09/22/2022.  Insomnia: Continue mirtazapine  and Xanax  0.25 mg nightly.   DVT prophylaxis: SCDs Start: 03/01/23 2313 Code Status: Full code, confirmed with patient on admission Family Communication: Discussed with patient, he has discussed with family Disposition Plan: From home, dispo pending clinical progress Consults called: Orthopedics Severity of Illness: The appropriate patient status for this patient is INPATIENT. Inpatient status is judged to be reasonable and necessary in order to provide the required intensity of service to ensure the patient's safety. The patient's presenting symptoms, physical exam findings, and initial radiographic and laboratory data in the context of their chronic comorbidities  is felt to place them at high risk for further clinical deterioration. Furthermore, it is not anticipated that the patient will be medically stable for discharge from the hospital within 2 midnights of admission.   * I certify that at the point of admission it is my clinical judgment that the patient will require inpatient hospital care spanning beyond 2 midnights from the point of admission due to high intensity of service, high risk for further deterioration and high frequency of surveillance required.Jordan Jorie Blanch MD [facility]  If 7PM-7AM, please contact night-coverage [email or web address]  03/01/2023, 11:21 PM

## 2023-03-01 NOTE — Hospital Course (Signed)
 Jordan Oconnell is a 83 y.o. male with medical history significant for PAF on Eliquis , AS s/p TAVR, tachy-brady syndrome s/p PPM CKD stage IV, urothelial cancer s/p multiple TURBTs with urostomy in place, RCC s/p left nephrectomy with recurrent urothelial disease to solitary right renal pelvis, hypothyroidism who is admitted with osteomyelitis of the right fourth toe.

## 2023-03-02 ENCOUNTER — Encounter (HOSPITAL_COMMUNITY): Disposition: A | Discharge status: Home / Self Care | Payer: Self-pay | Attending: Internal Medicine

## 2023-03-02 ENCOUNTER — Ambulatory Visit: Admission: RE | Admit: 2023-03-02 | Payer: Medicare PPO | Source: Home / Self Care | Admitting: Orthopaedic Surgery

## 2023-03-02 ENCOUNTER — Other Ambulatory Visit: Payer: Self-pay

## 2023-03-02 ENCOUNTER — Inpatient Hospital Stay (HOSPITAL_COMMUNITY): Payer: Medicare PPO | Admitting: Certified Registered"

## 2023-03-02 ENCOUNTER — Encounter (HOSPITAL_COMMUNITY): Payer: Self-pay | Admitting: Internal Medicine

## 2023-03-02 DIAGNOSIS — N186 End stage renal disease: Secondary | ICD-10-CM

## 2023-03-02 DIAGNOSIS — I132 Hypertensive heart and chronic kidney disease with heart failure and with stage 5 chronic kidney disease, or end stage renal disease: Secondary | ICD-10-CM | POA: Diagnosis not present

## 2023-03-02 DIAGNOSIS — I5023 Acute on chronic systolic (congestive) heart failure: Secondary | ICD-10-CM

## 2023-03-02 DIAGNOSIS — M86171 Other acute osteomyelitis, right ankle and foot: Secondary | ICD-10-CM | POA: Diagnosis not present

## 2023-03-02 DIAGNOSIS — L03115 Cellulitis of right lower limb: Secondary | ICD-10-CM | POA: Diagnosis not present

## 2023-03-02 DIAGNOSIS — M869 Osteomyelitis, unspecified: Secondary | ICD-10-CM | POA: Diagnosis not present

## 2023-03-02 HISTORY — PX: AMPUTATION TOE: SHX6595

## 2023-03-02 LAB — CBC
HCT: 38.1 % — ABNORMAL LOW (ref 39.0–52.0)
HCT: 34.9 % — ABNORMAL LOW (ref 39.0–52.0)
Hemoglobin: 11.8 g/dL — ABNORMAL LOW (ref 13.0–17.0)
Hemoglobin: 10.8 g/dL — ABNORMAL LOW (ref 13.0–17.0)
MCH: 32.4 pg (ref 26.0–34.0)
MCH: 32.4 pg (ref 26.0–34.0)
MCHC: 31 g/dL (ref 30.0–36.0)
MCHC: 30.9 g/dL (ref 30.0–36.0)
MCV: 104.7 fL — ABNORMAL HIGH (ref 80.0–100.0)
MCV: 104.8 fL — ABNORMAL HIGH (ref 80.0–100.0)
Platelets: 70 10*3/uL — ABNORMAL LOW (ref 150–400)
Platelets: 67 10*3/uL — ABNORMAL LOW (ref 150–400)
RBC: 3.64 MIL/uL — ABNORMAL LOW (ref 4.22–5.81)
RBC: 3.33 MIL/uL — ABNORMAL LOW (ref 4.22–5.81)
RDW: 14.4 % (ref 11.5–15.5)
RDW: 14.6 % (ref 11.5–15.5)
WBC: 3.5 10*3/uL — ABNORMAL LOW (ref 4.0–10.5)
WBC: 3 10*3/uL — ABNORMAL LOW (ref 4.0–10.5)
nRBC: 0 % (ref 0.0–0.2)
nRBC: 0 % (ref 0.0–0.2)

## 2023-03-02 LAB — SURGICAL PCR SCREEN
MRSA, PCR: NEGATIVE
Staphylococcus aureus: NEGATIVE

## 2023-03-02 LAB — COMPREHENSIVE METABOLIC PANEL
ALT: 12 U/L (ref 0–44)
AST: 18 U/L (ref 15–41)
Albumin: 3.8 g/dL (ref 3.5–5.0)
Alkaline Phosphatase: 56 U/L (ref 38–126)
Anion gap: 13 (ref 5–15)
BUN: 49 mg/dL — ABNORMAL HIGH (ref 8–23)
CO2: 17 mmol/L — ABNORMAL LOW (ref 22–32)
Calcium: 9.1 mg/dL (ref 8.9–10.3)
Chloride: 110 mmol/L (ref 98–111)
Creatinine, Ser: 3.67 mg/dL — ABNORMAL HIGH (ref 0.61–1.24)
GFR, Estimated: 16 mL/min — ABNORMAL LOW (ref >60)
Glucose, Bld: 125 mg/dL — ABNORMAL HIGH (ref 70–99)
Potassium: 4.6 mmol/L (ref 3.5–5.1)
Sodium: 140 mmol/L (ref 135–145)
Total Bilirubin: 0.7 mg/dL (ref 0.0–1.2)
Total Protein: 7 g/dL (ref 6.5–8.1)

## 2023-03-02 LAB — BASIC METABOLIC PANEL
Anion gap: 7 (ref 5–15)
BUN: 53 mg/dL — ABNORMAL HIGH (ref 8–23)
CO2: 20 mmol/L — ABNORMAL LOW (ref 22–32)
Calcium: 8.7 mg/dL — ABNORMAL LOW (ref 8.9–10.3)
Chloride: 109 mmol/L (ref 98–111)
Creatinine, Ser: 3.72 mg/dL — ABNORMAL HIGH (ref 0.61–1.24)
GFR, Estimated: 15 mL/min — ABNORMAL LOW (ref >60)
Glucose, Bld: 110 mg/dL — ABNORMAL HIGH (ref 70–99)
Potassium: 4.7 mmol/L (ref 3.5–5.1)
Sodium: 136 mmol/L (ref 135–145)

## 2023-03-02 SURGERY — AMPUTATION, TOE
Anesthesia: General | Site: Toe | Laterality: Right

## 2023-03-02 MED ORDER — ORAL CARE MOUTH RINSE: 15.0000 mL | Freq: Once | OROMUCOSAL | Status: AC

## 2023-03-02 MED ORDER — LIDOCAINE HCL (PF) 2 % IJ SOLN
INTRAMUSCULAR | Status: AC
  Filled 2023-03-02: qty 5

## 2023-03-02 MED ORDER — SODIUM CHLORIDE 0.9 % IV SOLN: 12.5000 mg | INTRAVENOUS | Status: DC | PRN

## 2023-03-02 MED ORDER — PROPOFOL 10 MG/ML IV BOLUS
INTRAVENOUS | Status: DC | PRN
  Administered 2023-03-02: 150 mg via INTRAVENOUS

## 2023-03-02 MED ORDER — HYDROMORPHONE HCL 1 MG/ML IJ SOLN
0.2500 mg | INTRAMUSCULAR | Status: DC | PRN
  Administered 2023-03-02 – 2023-03-03 (×4): 0.25 mg via INTRAVENOUS
  Filled 2023-03-02 (×4): qty 0.5

## 2023-03-02 MED ORDER — HYDROMORPHONE HCL 1 MG/ML IJ SOLN
INTRAMUSCULAR | Status: AC
  Filled 2023-03-02: qty 1

## 2023-03-02 MED ORDER — DEXAMETHASONE SODIUM PHOSPHATE 10 MG/ML IJ SOLN
INTRAMUSCULAR | Status: AC
  Filled 2023-03-02: qty 1

## 2023-03-02 MED ORDER — ONDANSETRON HCL 4 MG/2ML IJ SOLN
INTRAMUSCULAR | Status: DC | PRN
  Administered 2023-03-02: 4 mg via INTRAVENOUS

## 2023-03-02 MED ORDER — SODIUM CHLORIDE 0.9 % IV SOLN: INTRAVENOUS | Status: DC

## 2023-03-02 MED ORDER — PHENYLEPHRINE 80 MCG/ML (10ML) SYRINGE FOR IV PUSH (FOR BLOOD PRESSURE SUPPORT)
PREFILLED_SYRINGE | INTRAVENOUS | Status: AC
  Filled 2023-03-02: qty 10

## 2023-03-02 MED ORDER — HYDROMORPHONE HCL 1 MG/ML IJ SOLN
0.2500 mg | INTRAMUSCULAR | Status: DC | PRN
  Administered 2023-03-02: 0.5 mg via INTRAVENOUS

## 2023-03-02 MED ORDER — FENTANYL CITRATE (PF) 100 MCG/2ML IJ SOLN
INTRAMUSCULAR | Status: AC
  Filled 2023-03-02: qty 2

## 2023-03-02 MED ORDER — OXYCODONE HCL 5 MG PO TABS: 5.0000 mg | ORAL_TABLET | Freq: Once | ORAL | Status: DC | PRN

## 2023-03-02 MED ORDER — AMISULPRIDE (ANTIEMETIC) 5 MG/2ML IV SOLN: 10.0000 mg | Freq: Once | INTRAVENOUS | Status: DC | PRN

## 2023-03-02 MED ORDER — LACTATED RINGERS IV SOLN: INTRAVENOUS | Status: DC

## 2023-03-02 MED ORDER — VANCOMYCIN HCL 1000 MG IV SOLR
INTRAVENOUS | Status: AC
  Filled 2023-03-02: qty 20

## 2023-03-02 MED ORDER — MUPIROCIN 2 % EX OINT
1.0000 | TOPICAL_OINTMENT | Freq: Two times a day (BID) | CUTANEOUS | Status: DC
  Administered 2023-03-02 (×2): 1 via NASAL
  Filled 2023-03-02 (×2): qty 22

## 2023-03-02 MED ORDER — FENTANYL CITRATE (PF) 100 MCG/2ML IJ SOLN
INTRAMUSCULAR | Status: DC | PRN
  Administered 2023-03-02 (×4): 25 ug via INTRAVENOUS

## 2023-03-02 MED ORDER — PROPOFOL 10 MG/ML IV BOLUS
INTRAVENOUS | Status: AC
Start: 2023-03-02 — End: ?
  Filled 2023-03-02: qty 20

## 2023-03-02 MED ORDER — CEFAZOLIN SODIUM-DEXTROSE 2-4 GM/100ML-% IV SOLN
2.0000 g | INTRAVENOUS | Status: AC
  Administered 2023-03-02: 2 g via INTRAVENOUS
  Filled 2023-03-02: qty 100

## 2023-03-02 MED ORDER — MORPHINE SULFATE (PF) 2 MG/ML IV SOLN: 2.0000 mg | INTRAVENOUS | Status: DC | PRN

## 2023-03-02 MED ORDER — HYDRALAZINE HCL 20 MG/ML IJ SOLN
INTRAMUSCULAR | Status: AC
  Administered 2023-03-02: 10 mg via INTRAVENOUS
  Filled 2023-03-02: qty 1

## 2023-03-02 MED ORDER — LIDOCAINE 2% (20 MG/ML) 5 ML SYRINGE: INTRAMUSCULAR | Status: DC | PRN

## 2023-03-02 MED ORDER — VANCOMYCIN HCL 1000 MG IV SOLR
INTRAVENOUS | Status: DC | PRN
  Administered 2023-03-02: 1000 mg via TOPICAL

## 2023-03-02 MED ORDER — HYDRALAZINE HCL 20 MG/ML IJ SOLN: 10.0000 mg | Freq: Once | INTRAMUSCULAR | Status: AC

## 2023-03-02 MED ORDER — HYDRALAZINE HCL 20 MG/ML IJ SOLN
10.0000 mg | Freq: Once | INTRAMUSCULAR | Status: AC
  Administered 2023-03-02: 10 mg via INTRAVENOUS

## 2023-03-02 MED ORDER — CHLORHEXIDINE GLUCONATE 0.12 % MT SOLN
15.0000 mL | Freq: Once | OROMUCOSAL | Status: AC
Start: 2023-03-02 — End: 2023-03-02
  Administered 2023-03-02: 15 mL via OROMUCOSAL

## 2023-03-02 MED ORDER — LIDOCAINE HCL (PF) 2 % IJ SOLN
INTRAMUSCULAR | Status: DC | PRN
  Administered 2023-03-02: 40 mg via INTRADERMAL

## 2023-03-02 MED ORDER — EPHEDRINE SULFATE-NACL 50-0.9 MG/10ML-% IV SOSY
PREFILLED_SYRINGE | INTRAVENOUS | Status: DC | PRN
  Administered 2023-03-02: 5 mg via INTRAVENOUS
  Administered 2023-03-02 (×2): 10 mg via INTRAVENOUS

## 2023-03-02 MED ORDER — EPHEDRINE 5 MG/ML INJ
INTRAVENOUS | Status: AC
  Filled 2023-03-02: qty 5

## 2023-03-02 MED ORDER — ONDANSETRON HCL 4 MG/2ML IJ SOLN
INTRAMUSCULAR | Status: AC
  Filled 2023-03-02: qty 2

## 2023-03-02 MED ORDER — OXYCODONE HCL 5 MG/5ML PO SOLN: 5.0000 mg | Freq: Once | ORAL | Status: DC | PRN

## 2023-03-02 MED ORDER — CHLORHEXIDINE GLUCONATE 4 % EX SOLN
60.0000 mL | Freq: Once | CUTANEOUS | Status: DC
  Filled 2023-03-02: qty 60

## 2023-03-02 SURGICAL SUPPLY — 45 items
BAG COUNTER SPONGE SURGICOUNT (BAG) IMPLANT
BAG ZIPLOCK 12X15 (MISCELLANEOUS) ×1 IMPLANT
BLADE SURG 15 STRL LF DISP TIS (BLADE) ×3 IMPLANT
BLADE SW THK.38XMED LNG THN (BLADE) ×1 IMPLANT
BNDG ELASTIC 4INX 5YD STR LF (GAUZE/BANDAGES/DRESSINGS) IMPLANT
BNDG ESMARK 4X9 LF (GAUZE/BANDAGES/DRESSINGS) ×1 IMPLANT
BNDG STRETCH GAUZE 3IN X12FT (GAUZE/BANDAGES/DRESSINGS) ×1 IMPLANT
CHLORAPREP W/TINT 26 (MISCELLANEOUS) ×1 IMPLANT
CNTNR URN SCR LID CUP LEK RST (MISCELLANEOUS) IMPLANT
COVER SURGICAL LIGHT HANDLE (MISCELLANEOUS) ×1 IMPLANT
CUFF TRNQT CYL 34X4.125X (TOURNIQUET CUFF) IMPLANT
DRAPE C-ARM 42X120 X-RAY (DRAPES) IMPLANT
DRAPE EXTREMITY T 121X128X90 (DISPOSABLE) ×1 IMPLANT
DRAPE OEC MINIVIEW 54X84 (DRAPES) ×1 IMPLANT
DRAPE SHEET LG 3/4 BI-LAMINATE (DRAPES) ×1 IMPLANT
DRAPE SURG 17X11 SM STRL (DRAPES) ×2 IMPLANT
DRAPE U-SHAPE 47X51 STRL (DRAPES) ×2 IMPLANT
DRSG ADAPTIC 3X8 NADH LF (GAUZE/BANDAGES/DRESSINGS) ×1 IMPLANT
ELECT REM PT RETURN 15FT ADLT (MISCELLANEOUS) ×1 IMPLANT
GAUZE PAD ABD 8X10 STRL (GAUZE/BANDAGES/DRESSINGS) IMPLANT
GAUZE SPONGE 4X4 12PLY STRL (GAUZE/BANDAGES/DRESSINGS) ×1 IMPLANT
GLOVE BIO SURGEON STRL SZ8 (GLOVE) ×1 IMPLANT
GLOVE BIOGEL PI IND STRL 7.5 (GLOVE) ×1 IMPLANT
GLOVE BIOGEL PI IND STRL 8 (GLOVE) ×1 IMPLANT
GLOVE SS BIOGEL STRL SZ 7.5 (GLOVE) ×1 IMPLANT
GOWN STRL REUS W/ TWL LRG LVL3 (GOWN DISPOSABLE) ×1 IMPLANT
KIT BASIN OR (CUSTOM PROCEDURE TRAY) ×1 IMPLANT
KIT TURNOVER KIT A (KITS) IMPLANT
MARKER SKIN DUAL TIP RULER LAB (MISCELLANEOUS) ×1 IMPLANT
NS IRRIG 1000ML POUR BTL (IV SOLUTION) ×1 IMPLANT
PACK ORTHO EXTREMITY (CUSTOM PROCEDURE TRAY) IMPLANT
PACK TOTAL JOINT (CUSTOM PROCEDURE TRAY) ×1 IMPLANT
PAD CAST 4YDX4 CTTN HI CHSV (CAST SUPPLIES) ×1 IMPLANT
PROTECTOR NERVE ULNAR (MISCELLANEOUS) ×2 IMPLANT
SET IRRIG Y TYPE TUR BLADDER L (SET/KITS/TRAYS/PACK) ×1 IMPLANT
SOLUTION PRONTOSAN WOUND 350ML (IRRIGATION / IRRIGATOR) IMPLANT
STOCKINETTE 8 INCH (MISCELLANEOUS) ×1 IMPLANT
SUT ETHILON 2 0 PSLX (SUTURE) ×2 IMPLANT
SUT ETHILON 3 0 PS 1 (SUTURE) ×1 IMPLANT
SUT NYLON 3 0 (SUTURE) ×1 IMPLANT
SUT PDS AB 2-0 CT2 27 (SUTURE) IMPLANT
SUT PDS AB 3-0 PS2 18 (SUTURE) IMPLANT
SUT PDS AB 3-0 SH 27 (SUTURE) IMPLANT
SUT PROLENE 2 0 SH DA (SUTURE) IMPLANT
WATER STERILE IRR 1000ML POUR (IV SOLUTION) ×1 IMPLANT

## 2023-03-02 NOTE — Transfer of Care (Signed)
 Immediate Anesthesia Transfer of Care Note  Patient: Jordan Oconnell  Procedure(s) Performed: Procedure(s): AMPUTATION TOE, 4th (Right)  Patient Location: PACU  Anesthesia Type:General  Level of Consciousness:  sedated, patient cooperative and responds to stimulation  Airway & Oxygen Therapy:Patient Spontanous Breathing and Patient connected to face mask oxgen  Post-op Assessment:  Report given to PACU RN and Post -op Vital signs reviewed and stable  Post vital signs:  Reviewed and stable  Last Vitals:  Vitals:   03/02/23 0956 03/02/23 1420  BP: (!) 182/96 (!) 178/96  Pulse: 66 62  Resp:  18  Temp:  36.4 C  SpO2:  98%    Complications: No apparent anesthesia complications

## 2023-03-02 NOTE — Anesthesia Postprocedure Evaluation (Signed)
 Anesthesia Post Note  Patient: Jordan Oconnell  Procedure(s) Performed: AMPUTATION TOE, 4th (Right: Toe)     Patient location during evaluation: PACU Anesthesia Type: General Level of consciousness: awake and alert Pain management: pain level controlled Vital Signs Assessment: post-procedure vital signs reviewed and stable Respiratory status: spontaneous breathing, nonlabored ventilation and respiratory function stable Cardiovascular status: blood pressure returned to baseline and stable Postop Assessment: no apparent nausea or vomiting Anesthetic complications: no   No notable events documented.  Last Vitals:  Vitals:   03/02/23 1829 03/02/23 1830  BP: (!) 168/87 (!) 168/87  Pulse:    Resp:    Temp:    SpO2:      Last Pain:  Vitals:   03/02/23 1745  TempSrc:   PainSc: 3                  Butler Levander Oconnell

## 2023-03-02 NOTE — Progress Notes (Addendum)
 PROGRESS NOTE    Jordan Oconnell  FMW:[id number] DOB: 02-May-1940 DOA: 03/01/2023 PCP: Shepard Ade, MD   Brief Narrative: 83 year old with past medical history significant for PAF on Eliquis , aortic stenosis status post TAVR, tachybradycardia syndrome, status post pacemaker, CKD stage IV, urothelial cancer status post multiple TURP with urostomy in place, RCC status post left nephrectomy with recurrent urothelial disease to solitary right renal pelvis, hypothyroidism who was a direct admission for management of osteomyelitis of the fourth toe.  Patient directly admitted per Dr. Barton for toe amputation.    Assessment & Plan:   Principal Problem:   Osteomyelitis of fourth toe of right foot (HCC) Active Problems:   Urothelial cancer (HCC)   PAF (paroxysmal atrial fibrillation) (HCC)   CKD (chronic kidney disease) stage 4, GFR 15-29 ml/min (HCC)   S/P TAVR (transcatheter aortic valve replacement)   Chronic combined systolic and diastolic CHF (congestive heart failure) (HCC)   Hypothyroidism  1-Osteomyelitis of the right fourth toe: Presented as a direct admission per orthopedic Plan for toe amputation today. Holding Eliquis   CKD  stage IV: Recent creatinine baseline appears to be 3.2 Creatinine increased to 3.7.  Will start IV fluids preop Repeat renal function tomorrow   Urothelial cancer s/p urostomy and RCC s/p left nephrectomy with recurrent disease to solitary right kidney:  -Has multifocal urothelial disease, follows with [facility] oncology and nephrology -On active surveillance after most recent treatment being radiation therapy in 12 months of pembrolizumab  With urostomy care  Paroxysmal atrial fibrillation/flutter Tachy-brady syndrome s/p PPM: -Holding Eliquis , when okay by Ortho -Continue Coreg   -Continue amiodarone     Chronic combined systolic and diastolic CHF: Last EF improved to 50-55% (09/22/2022) compared to prior 30-35% (06/02/2022).  Appears  euvolemic on admission.  Not on diuretics as an outpatient.  Continue Coreg .  -Monitor volume status on IV fluids  Hypothyroidism: Continue Synthroid .   Severe aortic stenosis s/p TAVR 01/2021: Stable on last echo 09/22/2022.   Insomnia: Continue mirtazapine  and Xanax  0.25 mg nightly.   Thrombocytopenia; follow trend.  Chronic;; 87---75 Leukopenia; monitor  Estimated body mass index is 25.44 kg/m as calculated from the following:   Height as of 10/06/22: 6' 1 (1.854 m).   Weight as of 10/06/22: 87.5 kg.   DVT prophylaxis: SCD Code Status: Full code Family Communication: Care discussed with patient Disposition Plan:  Status is: Inpatient Remains inpatient appropriate because: management of osteomyelitis    Consultants:  Ortho  Procedures:    Antimicrobials:    Subjective: He is alert and conversant, denies chest pain shortness of breath.  He wants to know what time he will have surgery.  Nurse will assist to finding time  Objective: Vitals:   03/01/23 2100 03/02/23 0047 03/02/23 0428  BP: (!) 150/95 134/70 134/73  Pulse: 90 67 69  Resp: 16 20 20   Temp: 98.4 F (36.9 C) 98.3 F (36.8 C) 97.6 F (36.4 C)  SpO2: 99% 95% 94%    Intake/Output Summary (Last 24 hours) at 03/02/2023 0754 Last data filed at 03/02/2023 0557 Gross per 24 hour  Intake 250 ml  Output 800 ml  Net -550 ml   There were no vitals filed for this visit.  Examination:  General exam: Appears calm and comfortable  Respiratory system: Clear to auscultation. Respiratory effort normal. Cardiovascular system: S1 & S2 heard, RRR. No JVD, murmurs, rubs, gallops or clicks. No pedal edema. Gastrointestinal system: Abdomen is nondistended, soft and nontender. No organomegaly or masses felt. Normal bowel sounds  heard. Central nervous system: Alert and oriented.  Extremities: no edema    Data Reviewed: I have personally reviewed following labs and imaging studies  CBC: Recent Labs  Lab  03/01/23 2315 03/02/23 0544  WBC 3.5* 3.0*  HGB 11.8* 10.8*  HCT 38.1* 34.9*  MCV 104.7* 104.8*  PLT 70* 67*   Basic Metabolic Panel: Recent Labs  Lab 03/01/23 2315 03/02/23 0544  NA 140 136  K 4.6 4.7  CL 110 109  CO2 17* 20*  GLUCOSE 125* 110*  BUN 49* 53*  CREATININE 3.67* 3.72*  CALCIUM  9.1 8.7*   GFR: CrCl cannot be calculated (Unknown ideal weight.). Liver Function Tests: Recent Labs  Lab 03/01/23 2315  AST 18  ALT 12  ALKPHOS 56  BILITOT 0.7  PROT 7.0  ALBUMIN  3.8   No results for input(s): LIPASE, AMYLASE in the last 168 hours. No results for input(s): AMMONIA in the last 168 hours. Coagulation Profile: No results for input(s): INR, PROTIME in the last 168 hours. Cardiac Enzymes: No results for input(s): CKTOTAL, CKMB, CKMBINDEX, TROPONINI in the last 168 hours. BNP (last 3 results) No results for input(s): PROBNP in the last 8760 hours. HbA1C: No results for input(s): HGBA1C in the last 72 hours. CBG: No results for input(s): GLUCAP in the last 168 hours. Lipid Profile: No results for input(s): CHOL, HDL, LDLCALC, TRIG, CHOLHDL, LDLDIRECT in the last 72 hours. Thyroid  Function Tests: No results for input(s): TSH, T4TOTAL, FREET4, T3FREE, THYROIDAB in the last 72 hours. Anemia Panel: No results for input(s): VITAMINB12, FOLATE, FERRITIN, TIBC, IRON, RETICCTPCT in the last 72 hours. Sepsis Labs: No results for input(s): PROCALCITON, LATICACIDVEN in the last 168 hours.  Recent Results (from the past 240 hours)  Surgical PCR screen     Status: None   Collection Time: 03/02/23 12:53 AM   Specimen: Nasal Mucosa; Nasal Swab  Result Value Ref Range Status   MRSA, PCR NEGATIVE NEGATIVE Final   Staphylococcus aureus NEGATIVE NEGATIVE Final    Comment: (NOTE) The Xpert SA Assay (FDA approved for NASAL specimens in patients 92 years of age and older), is one component of a  comprehensive surveillance program. It is not intended to diagnose infection nor to guide or monitor treatment. Performed at [facility], [address] [place], [place] [address]          Radiology Studies: MR FOOT RIGHT WO CONTRAST Result Date: 03/01/2023 CLINICAL DATA:  Preoperative evaluation.  Surgery tomorrow. EXAM: MRI OF THE RIGHT FOREFOOT WITHOUT CONTRAST TECHNIQUE: Multiplanar, multisequence MR imaging of the right forefoot was performed. No intravenous contrast was administered. COMPARISON:  None Available. FINDINGS: Soft tissues There is moderate fourth toe soft tissue swelling and edema. This is greatest around the fourth PIP joint. No walled-off abscess is seen. Bones/Joint/Cartilage There is abnormal decreased T1 and increased T2 signal throughout the distal 80% of the proximal phalanx and near the entire middle phalanx of the fourth toe. Mild thinning of the skin just dorsal to the fourth PIP joint (sagittal series 9, image 10), possible ulcer/wound. There is moderate cortical erosion of the distal aspect of the proximal phalanx and mild cortical erosion within the proximal dorsal aspect of the middle phalanx of the fourth toe. There is mild-to-moderate chronic cystic change at the lateral aspect of the fifth metatarsal head. No definitive soft tissue ulcer is seen in this region. There is decreased T1 and decreased T2 signal within the subcutaneous fat plantar to the fifth metatarsal head compatible  with a pressure lesion (sagittal series 9, image 4 and coronal series 8, image 4). Recommend clinical correlation for any concern for soft tissue infection around the fifth metatarsal head. Moderate first tarsometatarsal cartilage thinning and subchondral cystic change. Ligaments The Lisfranc ligament complex is intact. There is attenuation and intermediate T2 signal within the fourth toe PIP medial and lateral collateral ligament likely secondary to the adjacent  osteomyelitis. Otherwise, the metatarsophalangeal and interphalangeal coronal ligaments appear intact. Muscles and Tendons Mild diffuse edema throughout the intrinsic foot musculature, nonspecific. They flexor and extensor tendons are intact. IMPRESSION: 1. Possible shallow soft tissue ulcer dorsal to the fourth PIP joint. 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 2. Moderate fourth toe soft tissue swelling and edema. No walled-off abscess is seen. 3. There is mild-to-moderate chronic cystic change at the lateral aspect of the fifth metatarsal head. No definitive soft tissue ulcer or osteomyelitis is seen in this region. Likely noninfected pressure lesion plantar to the fifth metatarsal head. Recommend clinical correlation for any concern for soft tissue infection around the fifth metatarsal head. Electronically Signed   By: Tanda Lyons M.D.   On: 03/01/2023 17:21        Scheduled Meds:  ALPRAZolam   0.25 mg Oral QHS   amiodarone   200 mg Oral Daily   carvedilol   12.5 mg Oral BID   levothyroxine   75 mcg Oral Q0600   mirtazapine   7.5 mg Oral QHS   mupirocin  ointment  1 Application Nasal BID   sodium bicarbonate   1,300 mg Oral BID   sodium chloride  flush  3 mL Intravenous Q12H   Continuous Infusions:   LOS: 1 day    Time spent: 35 minutes    Davyn Elsasser A Dlynn Ranes, MD [facility]   If 7PM-7AM, please contact night-coverage [email or web address]  03/02/2023, 7:54 AM

## 2023-03-02 NOTE — Plan of Care (Signed)

## 2023-03-02 NOTE — Anesthesia Procedure Notes (Signed)
 Procedure Name: LMA Insertion Date/Time: 03/02/2023 4:00 PM  Performed by: Brandy Almarie BROCKS, CRNAPre-anesthesia Checklist: Patient identified, Emergency Drugs available, Suction available and Patient being monitored Patient Re-evaluated:Patient Re-evaluated prior to induction Oxygen Delivery Method: Circle system utilized Preoxygenation: Pre-oxygenation with 100% oxygen Induction Type: IV induction LMA: LMA with gastric port inserted LMA Size: 4.0 Number of attempts: 1 Dental Injury: Teeth and Oropharynx as per pre-operative assessment

## 2023-03-02 NOTE — H&P (Signed)
 H&P Update:  -History and Physical Reviewed  -Patient has been re-examined  -No change in the plan of care  -The risks and benefits were presented and reviewed. The risks due to hardware/suture failure and/or irritation, recurrent/new/persistent infection, stiffness, nerve/vessel/tendon injury or rerupture of repaired tendon, nonunion/malunion, allograft usage, wound healing issues, development of arthritis, failure of this surgery, possibility of delayed definitive surgery, need for further surgery, thromboembolic events, anesthesia/medical complications, amputation, death among others were discussed. The patient acknowledged the explanation, agreed to proceed with the plan and a consent was signed.  Lillia Mountain

## 2023-03-02 NOTE — Anesthesia Preprocedure Evaluation (Signed)
 Anesthesia Evaluation  Patient identified by MRN, date of birth, ID band Patient awake    Reviewed: Allergy & Precautions, NPO status , Patient's Chart, lab work & pertinent test results  History of Anesthesia Complications Negative for: history of anesthetic complications  Airway Mallampati: IV  TM Distance: <3 FB Neck ROM: Limited    Dental  (+) Poor Dentition, Chipped, Dental Advisory Given   Pulmonary shortness of breath, neg COPD   breath sounds clear to auscultation       Cardiovascular hypertension, Pt. on medications + dysrhythmias Atrial Fibrillation + pacemaker  Rhythm:Irregular   1. Systolic dysfunction is new compared with the echo 01/2021.   2. Left ventricular ejection fraction, by estimation, is 20 to 25%. The  left ventricle has severely decreased function. The left ventricle  demonstrates regional wall motion abnormalities (see scoring  diagram/findings for description). There is mild  concentric left ventricular hypertrophy. Left ventricular diastolic  function could not be evaluated.   3. Right ventricular systolic function is mildly reduced. The right  ventricular size is normal. There is normal pulmonary artery systolic  pressure.   4. Left atrial size was severely dilated.   5. The mitral valve is normal in structure. Mild mitral valve  regurgitation. No evidence of mitral stenosis.   6. The aortic valve has been repaired/replaced. Aortic valve  regurgitation is not visualized. No aortic stenosis is present. There is a  29 mm Sapien prosthetic (TAVR) valve present in the aortic position.  Aortic valve mean gradient measures 5.8 mmHg.  Aortic valve Vmax measures 1.58 m/s.   7. Aortic dilatation noted. There is moderate dilatation of the ascending  aorta, measuring 43 mm.   8. The inferior vena cava is normal in size with greater than 50%  respiratory variability, suggesting right atrial pressure of 3 mmHg.        Neuro/Psych negative neurological ROS  negative psych ROS   GI/Hepatic Neg liver ROS,GERD  ,,  Endo/Other  negative endocrine ROS    Renal/GU CRFRenal diseaseLab Results      Component                Value               Date                      CREATININE               3.06 (H)            03/12/2022            Lab Results      Component                Value               Date                      K                        4.7                 03/12/2022                Musculoskeletal negative musculoskeletal ROS (+)    Abdominal   Peds  Hematology  (+) Blood dyscrasia, anemia Lab Results      Component  Value               Date                      WBC                      7.0                 03/11/2022                HGB                      10.0 (L)            03/11/2022                HCT                      34.4 (L)            03/11/2022                MCV                      88.0                03/11/2022                PLT                      109 (L)             03/11/2022               Anesthesia Other Findings   Reproductive/Obstetrics                             Anesthesia Physical Anesthesia Plan  ASA: 3  Anesthesia Plan: General   Post-op Pain Management:    Induction: Intravenous  PONV Risk Score and Plan: 2 and Treatment may vary due to age or medical condition, Ondansetron  and Midazolam   Airway Management Planned: LMA  Additional Equipment: None  Intra-op Plan:   Post-operative Plan: Extubation in OR  Informed Consent: I have reviewed the patients History and Physical, chart, labs and discussed the procedure including the risks, benefits and alternatives for the proposed anesthesia with the patient or authorized representative who has indicated his/her understanding and acceptance.     Dental advisory given  Plan Discussed with: CRNA  Anesthesia Plan Comments:        Anesthesia  Quick Evaluation

## 2023-03-02 NOTE — Op Note (Signed)
 03/02/2023  4:40 PM   PATIENT: Jordan Oconnell  83 y.o. male  MRN: [id number]   PRE-OPERATIVE DIAGNOSIS:   Right fourth toe extensive osteomyelitis involving proximal and middle phalanges with forefoot cellulitis   POST-OPERATIVE DIAGNOSIS:   Same   PROCEDURE: Right 4th toe disarticulation at metatarsophalangeal joint   SURGEON:  Lillia Mountain, MD   ASSISTANT: None   ANESTHESIA: General, regional   EBL: 2 cc   TOURNIQUET:  None used   COMPLICATIONS: None apparent   DISPOSITION: Extubated, awake and stable to recovery.   INDICATION FOR PROCEDURE: The patient presented with above diagnosis.  We discussed the diagnosis, alternative treatment options, risks and benefits of the above surgical intervention, as well as alternative non-operative treatments. All questions/concerns were addressed and the patient/family demonstrated appropriate understanding of the diagnosis, the procedure, the postoperative course, and overall prognosis. The patient wished to proceed with surgical intervention and signed an informed surgical consent as such, in each others presence prior to surgery.   PROCEDURE IN DETAIL: After preoperative consent was obtained and the correct operative site was identified, the patient was brought to the operating room supine on stretcher and transferred onto operating table. General anesthesia was induced. Preoperative antibiotics were administered. Surgical timeout was taken. The patient was then positioned supine with an ipsilateral hip bump. The operative lower extremity was prepped and draped in standard sterile fashion.  Level of amputation was determined by soft tissue status to be the fourth MTP joint as confirmed by intraoperative examination. A standard racket incision was made in healthy skin area at the level of the metatarsophalangeal joint. Dissection was carried down sharply to the proximal phalanx. The surrounding tendons and other soft  tissues were carefully transected and the collateral ligaments of the MTP joint released. The infected toe was thus excised and sent as specimen for bone biopsy/intraoperative cultures.  We then irrigated the surgical site thoroughly with 3 L of normal saline and Prontosan solution. Healthy bleeding noted throughout the amputation stump site. There were no signs of infection remaining in the surgical site following amputation. Hemostasis was carefully obtained. Mild steady bleeding observed and noted to be appropriate for surgical site. Betadine  and vancomycin  powder were placed in the amputation site.    The skin was closed with zero tension using 2-0 prolene suture. The incision was noted to be dry upon closure. Overall, the operative foot was noted to be significantly less erythematous with decreased swelling.   The leg was cleaned with saline and sterile adaptic dressings were applied. A well padded sterile dressing was applied. The patient was awakened from anesthesia and transported to the recovery room in stable condition.     FOLLOW UP PLAN: -transfer to PACU, then [facility] hospitalist team -strict heel WB operative extremity, maximum elevation -maintain dressings until 24 hr, then bedside nursing to do daily changes. Keep incision dry -DVT ppx: resume Eliquis  POD1 or per hospitalist team discretion -follow up as outpatient within 7-10 days for wound check -trend intraoperative cultures -IV abx per hospitalist/ID teams, due to extensive bone infection advise min 6 wk course -sutures out in 3 weeks in outpatient office   RADIOGRAPHS: AP, lateral, oblique radiographs of the right foot were obtained intraoperatively. These showed interval 4th toe amputation at the MTP joint. No other acute injuries are noted.   Lillia Mountain [facility]

## 2023-03-03 ENCOUNTER — Encounter (HOSPITAL_COMMUNITY): Payer: Self-pay | Admitting: Orthopaedic Surgery

## 2023-03-03 DIAGNOSIS — M86171 Other acute osteomyelitis, right ankle and foot: Secondary | ICD-10-CM

## 2023-03-03 DIAGNOSIS — N184 Chronic kidney disease, stage 4 (severe): Secondary | ICD-10-CM

## 2023-03-03 DIAGNOSIS — Z952 Presence of prosthetic heart valve: Secondary | ICD-10-CM

## 2023-03-03 DIAGNOSIS — I480 Paroxysmal atrial fibrillation: Secondary | ICD-10-CM

## 2023-03-03 DIAGNOSIS — C689 Malignant neoplasm of urinary organ, unspecified: Secondary | ICD-10-CM

## 2023-03-03 DIAGNOSIS — M869 Osteomyelitis, unspecified: Secondary | ICD-10-CM | POA: Diagnosis not present

## 2023-03-03 DIAGNOSIS — I495 Sick sinus syndrome: Secondary | ICD-10-CM

## 2023-03-03 DIAGNOSIS — Z950 Presence of cardiac pacemaker: Secondary | ICD-10-CM

## 2023-03-03 LAB — BASIC METABOLIC PANEL
Anion gap: 12 (ref 5–15)
BUN: 42 mg/dL — ABNORMAL HIGH (ref 8–23)
CO2: 17 mmol/L — ABNORMAL LOW (ref 22–32)
Calcium: 8.8 mg/dL — ABNORMAL LOW (ref 8.9–10.3)
Chloride: 108 mmol/L (ref 98–111)
Creatinine, Ser: 3 mg/dL — ABNORMAL HIGH (ref 0.61–1.24)
GFR, Estimated: 20 mL/min — ABNORMAL LOW (ref >60)
Glucose, Bld: 138 mg/dL — ABNORMAL HIGH (ref 70–99)
Potassium: 4.7 mmol/L (ref 3.5–5.1)
Sodium: 137 mmol/L (ref 135–145)

## 2023-03-03 LAB — VITAMIN B12: Vitamin B-12: 152 pg/mL — ABNORMAL LOW (ref 180–914)

## 2023-03-03 LAB — CBC
HCT: 37.1 % — ABNORMAL LOW (ref 39.0–52.0)
Hemoglobin: 11 g/dL — ABNORMAL LOW (ref 13.0–17.0)
MCH: 31.6 pg (ref 26.0–34.0)
MCHC: 29.6 g/dL — ABNORMAL LOW (ref 30.0–36.0)
MCV: 106.6 fL — ABNORMAL HIGH (ref 80.0–100.0)
Platelets: 65 10*3/uL — ABNORMAL LOW (ref 150–400)
RBC: 3.48 MIL/uL — ABNORMAL LOW (ref 4.22–5.81)
RDW: 14.5 % (ref 11.5–15.5)
WBC: 4.6 10*3/uL (ref 4.0–10.5)
nRBC: 0 % (ref 0.0–0.2)

## 2023-03-03 MED ORDER — CYANOCOBALAMIN 1000 MCG/ML IJ SOLN
1000.0000 ug | Freq: Every day | INTRAMUSCULAR | Status: AC
  Administered 2023-03-03 – 2023-03-07 (×5): 1000 ug via INTRAMUSCULAR
  Filled 2023-03-03 (×5): qty 1

## 2023-03-03 MED ORDER — AMLODIPINE BESYLATE 5 MG PO TABS
5.0000 mg | ORAL_TABLET | Freq: Every day | ORAL | Status: DC
  Administered 2023-03-03 – 2023-03-04 (×2): 5 mg via ORAL
  Filled 2023-03-03 (×2): qty 1

## 2023-03-03 MED ORDER — DAPTOMYCIN-SODIUM CHLORIDE 700-0.9 MG/100ML-% IV SOLN
8.0000 mg/kg | INTRAVENOUS | Status: DC
Start: 2023-03-03 — End: 2023-03-12
  Administered 2023-03-03 – 2023-03-11 (×5): 700 mg via INTRAVENOUS
  Filled 2023-03-03 (×5): qty 100

## 2023-03-03 MED ORDER — LINEZOLID 600 MG/300ML IV SOLN
600.0000 mg | Freq: Two times a day (BID) | INTRAVENOUS | Status: DC
  Filled 2023-03-03: qty 300

## 2023-03-03 MED ORDER — APIXABAN 2.5 MG PO TABS
2.5000 mg | ORAL_TABLET | Freq: Two times a day (BID) | ORAL | Status: DC
  Administered 2023-03-03 – 2023-03-06 (×7): 2.5 mg via ORAL
  Filled 2023-03-03 (×7): qty 1

## 2023-03-03 MED ORDER — OXYCODONE HCL 5 MG PO TABS
5.0000 mg | ORAL_TABLET | Freq: Four times a day (QID) | ORAL | Status: DC | PRN
  Administered 2023-03-03 – 2023-03-09 (×5): 5 mg via ORAL
  Filled 2023-03-03 (×5): qty 1

## 2023-03-03 MED ORDER — SODIUM CHLORIDE 0.9 % IV SOLN
2.0000 g | INTRAVENOUS | Status: DC
  Administered 2023-03-03 – 2023-03-12 (×10): 2 g via INTRAVENOUS
  Filled 2023-03-03 (×11): qty 20

## 2023-03-03 MED ORDER — SODIUM BICARBONATE 650 MG PO TABS
1300.0000 mg | ORAL_TABLET | Freq: Three times a day (TID) | ORAL | Status: DC
  Administered 2023-03-03 – 2023-03-12 (×28): 1300 mg via ORAL
  Filled 2023-03-03 (×28): qty 2

## 2023-03-03 NOTE — Progress Notes (Signed)
 PROGRESS NOTE    Jordan Oconnell  FMW:[id number] DOB: October 19, 1940 DOA: 03/01/2023 PCP: Shepard Ade, MD   Brief Narrative: 83 year old with past medical history significant for PAF on Eliquis , aortic stenosis status post TAVR, tachybradycardia syndrome, status post pacemaker, CKD stage IV, urothelial cancer status post multiple TURP with urostomy in place, RCC status post left nephrectomy with recurrent urothelial disease to solitary right renal pelvis, hypothyroidism who was a direct admission for management of osteomyelitis of the fourth toe.  Patient directly admitted per Dr. Barton for toe amputation.    Assessment & Plan:   Principal Problem:   Osteomyelitis of fourth toe of right foot (HCC) Active Problems:   Urothelial cancer (HCC)   PAF (paroxysmal atrial fibrillation) (HCC)   CKD (chronic kidney disease) stage 4, GFR 15-29 ml/min (HCC)   S/P TAVR (transcatheter aortic valve replacement)   Chronic combined systolic and diastolic CHF (congestive heart failure) (HCC)   Hypothyroidism  1-Osteomyelitis of the right fourth toe: Presented as a direct admission per orthopedic Underwent right fourth toe disarticulation at metatarsophalangeal joint Ok to resume Eliquis  per ortho.  Straight heel weightbearing operative extremity, maximal elevation Ortho recommend IV antibiotics due to extensive bone infection.  Patient will require minimum of 6 weeks.  ID has been consulted  CKD  stage IV: Recent creatinine baseline appears to be 3.2 Creatinine increased to 3.7.  received fluids post op.  Cr stable today down to baseline.  Metabolic acidosis, change sodium bicarb tablets to 3 times daily.  Urothelial cancer s/p urostomy and RCC s/p left nephrectomy with recurrent disease to solitary right kidney:  -Has multifocal urothelial disease, follows with [facility] oncology and nephrology -On active surveillance after most recent treatment being radiation therapy in 12 months of  pembrolizumab  Continue with urostomy care  Paroxysmal atrial fibrillation/flutter Tachy-brady syndrome s/p PPM: -Holding Eliquis , when okay by Ortho -Continue Coreg   -Continue amiodarone     Hypertension: Will add low-dose Norvasc   Chronic combined systolic and diastolic CHF: Last EF improved to 50-55% (09/22/2022) compared to prior 30-35% (06/02/2022).  Appears euvolemic on admission.  Not on diuretics as an outpatient.  Continue Coreg .  -Monitor volume status on IV fluids  Hypothyroidism: Continue Synthroid .   Severe aortic stenosis s/p TAVR 01/2021: Stable on last echo 09/22/2022.   Insomnia: Continue mirtazapine  and Xanax  0.25 mg nightly.   Thrombocytopenia; follow trend.  Chronic;; 87---75--65 B12 deficiency: Start intramuscular supplement Leukopenia; monitor  Estimated body mass index is 25.2 kg/m as calculated from the following:   Height as of this encounter: 6' 1 (1.854 m).   Weight as of this encounter: 86.6 kg.   DVT prophylaxis: SCD Code Status: Full code Family Communication: Care discussed with patient Disposition Plan:  Status is: Inpatient Remains inpatient appropriate because: management of osteomyelitis    Consultants:  Ortho  Procedures:    Antimicrobials:    Subjective: He was having pain at surgical site, Tylenol  and Dilaudid  helping He lives at home alone, he will need home health PT OT aide.  Will see how he does with PT OT.  Last resource will be rehab  Objective: Vitals:   03/02/23 2015 03/02/23 2337 03/03/23 0447 03/03/23 1338  BP: (!) 185/82 (!) 130/58 (!) 146/82 (!) 156/73  Pulse: 72 75 75 73  Resp: 18 17 18 19   Temp: 97.7 F (36.5 C) 97.8 F (36.6 C) 97.6 F (36.4 C) (!) 97.4 F (36.3 C)  TempSrc: Oral  Oral Oral  SpO2: 97% 97% 98% 97%  Weight:      Height:        Intake/Output Summary (Last 24 hours) at 03/03/2023 1547 Last data filed at 03/03/2023 1300 Gross per 24 hour  Intake 1190 ml  Output 2850 ml  Net -1660 ml    Filed Weights   03/02/23 1433  Weight: 86.6 kg    Examination:  General exam; NAD Respiratory system: CTA Cardiovascular system: S 1, S 2 RRR Gastrointestinal system: BS present, soft, nt Central nervous system: alert Extremities: Right foot with dressing    Data Reviewed: I have personally reviewed following labs and imaging studies  CBC: Recent Labs  Lab 03/01/23 2315 03/02/23 0544 03/03/23 0620  WBC 3.5* 3.0* 4.6  HGB 11.8* 10.8* 11.0*  HCT 38.1* 34.9* 37.1*  MCV 104.7* 104.8* 106.6*  PLT 70* 67* 65*   Basic Metabolic Panel: Recent Labs  Lab 03/01/23 2315 03/02/23 0544 03/03/23 0620  NA 140 136 137  K 4.6 4.7 4.7  CL 110 109 108  CO2 17* 20* 17*  GLUCOSE 125* 110* 138*  BUN 49* 53* 42*  CREATININE 3.67* 3.72* 3.00*  CALCIUM  9.1 8.7* 8.8*   GFR: Estimated Creatinine Clearance: 21.1 mL/min (A) (by C-G formula based on SCr of 3 mg/dL (H)). Liver Function Tests: Recent Labs  Lab 03/01/23 2315  AST 18  ALT 12  ALKPHOS 56  BILITOT 0.7  PROT 7.0  ALBUMIN  3.8   No results for input(s): LIPASE, AMYLASE in the last 168 hours. No results for input(s): AMMONIA in the last 168 hours. Coagulation Profile: No results for input(s): INR, PROTIME in the last 168 hours. Cardiac Enzymes: No results for input(s): CKTOTAL, CKMB, CKMBINDEX, TROPONINI in the last 168 hours. BNP (last 3 results) No results for input(s): PROBNP in the last 8760 hours. HbA1C: No results for input(s): HGBA1C in the last 72 hours. CBG: No results for input(s): GLUCAP in the last 168 hours. Lipid Profile: No results for input(s): CHOL, HDL, LDLCALC, TRIG, CHOLHDL, LDLDIRECT in the last 72 hours. Thyroid  Function Tests: No results for input(s): TSH, T4TOTAL, FREET4, T3FREE, THYROIDAB in the last 72 hours. Anemia Panel: Recent Labs    03/03/23 0620  VITAMINB12 152*   Sepsis Labs: No results for input(s): PROCALCITON,  LATICACIDVEN in the last 168 hours.  Recent Results (from the past 240 hours)  Surgical PCR screen     Status: None   Collection Time: 03/02/23 12:53 AM   Specimen: Nasal Mucosa; Nasal Swab  Result Value Ref Range Status   MRSA, PCR NEGATIVE NEGATIVE Final   Staphylococcus aureus NEGATIVE NEGATIVE Final    Comment: (NOTE) The Xpert SA Assay (FDA approved for NASAL specimens in patients 13 years of age and older), is one component of a comprehensive surveillance program. It is not intended to diagnose infection nor to guide or monitor treatment. Performed at [facility], [address] [place], [place] [address]   Aerobic/Anaerobic Culture w Gram Stain (surgical/deep wound)     Status: None (Preliminary result)   Collection Time: 03/02/23  4:37 PM   Specimen: Toe, Right; Amputation  Result Value Ref Range Status   Specimen Description   Final    TOE RIGHT 4TH TOE Performed at [facility], [address] [place], [place] [address]    Special Requests   Final    NONE Performed at [facility], [address], [place], [place] [address]    Gram Stain NO WBC SEEN NO ORGANISMS SEEN  Final   Culture   Final    NO GROWTH < 24 HOURS Performed at [facility], [address] [place], [place] [address]    Report Status PENDING  Incomplete         Radiology Studies: MR FOOT RIGHT WO CONTRAST Result Date: 03/01/2023 CLINICAL DATA:  Preoperative evaluation.  Surgery tomorrow. EXAM: MRI OF THE RIGHT FOREFOOT WITHOUT CONTRAST TECHNIQUE: Multiplanar, multisequence MR imaging of the right forefoot was performed. No intravenous contrast was administered. COMPARISON:  None Available. FINDINGS: Soft tissues There is moderate fourth toe soft tissue swelling and edema. This is greatest around the fourth PIP joint. No walled-off abscess is seen. Bones/Joint/Cartilage There is abnormal decreased T1 and increased T2 signal  throughout the distal 80% of the proximal phalanx and near the entire middle phalanx of the fourth toe. Mild thinning of the skin just dorsal to the fourth PIP joint (sagittal series 9, image 10), possible ulcer/wound. There is moderate cortical erosion of the distal aspect of the proximal phalanx and mild cortical erosion within the proximal dorsal aspect of the middle phalanx of the fourth toe. There is mild-to-moderate chronic cystic change at the lateral aspect of the fifth metatarsal head. No definitive soft tissue ulcer is seen in this region. There is decreased T1 and decreased T2 signal within the subcutaneous fat plantar to the fifth metatarsal head compatible with a pressure lesion (sagittal series 9, image 4 and coronal series 8, image 4). Recommend clinical correlation for any concern for soft tissue infection around the fifth metatarsal head. Moderate first tarsometatarsal cartilage thinning and subchondral cystic change. Ligaments The Lisfranc ligament complex is intact. There is attenuation and intermediate T2 signal within the fourth toe PIP medial and lateral collateral ligament likely secondary to the adjacent osteomyelitis. Otherwise, the metatarsophalangeal and interphalangeal coronal ligaments appear intact. Muscles and Tendons Mild diffuse edema throughout the intrinsic foot musculature, nonspecific. They flexor and extensor tendons are intact. IMPRESSION: 1. Possible shallow soft tissue ulcer dorsal to the fourth PIP joint. 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 2. Moderate fourth toe soft tissue swelling and edema. No walled-off abscess is seen. 3. There is mild-to-moderate chronic cystic change at the lateral aspect of the fifth metatarsal head. No definitive soft tissue ulcer or osteomyelitis is seen in this region. Likely noninfected pressure lesion plantar to the fifth metatarsal head. Recommend clinical correlation  for any concern for soft tissue infection around the fifth metatarsal head. Electronically Signed   By: Tanda Lyons M.D.   On: 03/01/2023 17:21        Scheduled Meds:  ALPRAZolam   0.25 mg Oral QHS   amiodarone   200 mg Oral Daily   apixaban   2.5 mg Oral BID   carvedilol   12.5 mg Oral BID   cyanocobalamin   1,000 mcg Intramuscular Daily   levothyroxine   75 mcg Oral Q0600   mirtazapine   7.5 mg Oral QHS   sodium bicarbonate   1,300 mg Oral TID   sodium chloride  flush  3 mL Intravenous Q12H   Continuous Infusions:  cefTRIAXone  (ROCEPHIN )  IV 2 g (03/03/23 0943)   DAPTOmycin  700 mg (03/03/23 1413)     LOS: 2 days    Time spent: 35 minutes    Shailey Butterbaugh A Heloise Gordan, MD [facility]   If 7PM-7AM, please contact night-coverage [email or web address]  03/03/2023, 3:47 PM

## 2023-03-03 NOTE — Consult Note (Signed)
 Date of Admission:  03/01/2023          Reason for Consult: Right toe osteomyelitis status post right fourth toe disarticulation at the metatarsophalangeal joint    Referring Provider: Owen Lore, MD   Assessment:  Osteomyelitis of the fourth toe History of TAVR for aortic stenosis Paroxysmal atrial fibrillation on Eliquis  Tachybradycardia syndrome status post PEG pacemaker placement Chronic kidney disease in the context of nephrectomy Urothelias cancer sp TURP x multiple times with urostomy, and recurrent disease to solitary right renal pelvis  Plan:  Change antibiotics to daptomycin  and ceftriaxone  I put a order in for IR to place central line for his antibiotics Will follow-up operative cultures ESR CRP Screening for HIV, and viral hepatides  Principal Problem:   Osteomyelitis of fourth toe of right foot (HCC) Active Problems:   Urothelial cancer (HCC)   PAF (paroxysmal atrial fibrillation) (HCC)   CKD (chronic kidney disease) stage 4, GFR 15-29 ml/min (HCC)   S/P TAVR (transcatheter aortic valve replacement)   Chronic combined systolic and diastolic CHF (congestive heart failure) (HCC)   Hypothyroidism   Scheduled Meds:  ALPRAZolam   0.25 mg Oral QHS   amiodarone   200 mg Oral Daily   apixaban   2.5 mg Oral BID   carvedilol   12.5 mg Oral BID   cyanocobalamin   1,000 mcg Intramuscular Daily   levothyroxine   75 mcg Oral Q0600   mirtazapine   7.5 mg Oral QHS   sodium bicarbonate   1,300 mg Oral TID   sodium chloride  flush  3 mL Intravenous Q12H   Continuous Infusions:  cefTRIAXone  (ROCEPHIN )  IV 2 g (03/03/23 0943)   DAPTOmycin  700 mg (03/03/23 1413)   PRN Meds:.acetaminophen  **OR** acetaminophen , bisacodyl , HYDROmorphone  (DILAUDID ) injection, ondansetron  **OR** ondansetron  (ZOFRAN ) IV, oxyCODONE , senna-docusate  HPI: Jordan Oconnell is a 83 y.o. male with past medical history significant for paroxysmal atrial fibrillation, aortic stenosis status post  TAVR tachybradycardia syndrome with pacemaker chronic kidney disease stage IV, urothelial cancer status post TURP multiple times, urostomy, renal cell carcinoma status post left nephrectomy with recurrent urothelial disease and solitary right renal pelvis who had injured his toe roughly 2 months ago but then had abrupt deterioration after recently having his nails cut.  He was seen by orthopedics who arranged for him to be directly admitted to the hospital.  MRI of the foot has been independently reviewed by myself and indeed shows a shallow soft tissue ulcer dorsal to the fourth PIP joint with osteomyelitis of the distal 80% of the proximal phalanx and near entire middle phalanx of the fourth toe with moderate fourth toe swelling and edema and moderate to mild chronic cystic change of the lateral aspect of the fifth metatarsal head   Antibiotics were appropriately withheld prior to surgery and cultures were taken in the operating room where he underwent right fourth toe disarticulated at the metatarsophalangeal joint. No organisms have been seen yet on Gram stain.  We will switch him over to an empiric regimen of daptomycin  once daily with daily ceftriaxone  to cover the usual suspects Due to his chronic kidney disease will need to place a central line and we will plan on giving him a course of 6 weeks of parenteral therapy with close monitoring with orthopedic surgery and with our group as well.  I will make sure he has a postoperative sed rate CRP  I have personally spent 86 minutes involved in face-to-face and non-face-to-face activities for this patient on the day  of the visit. Professional time spent includes the following activities: Preparing to see the patient (review of tests), Obtaining and/or reviewing separately obtained history (admission/discharge record), Performing a medically appropriate examination and/or evaluation , Ordering medications/tests/procedures, referring and  communicating with other health care professionals, Documenting clinical information in the EMR, Independently interpreting results (not separately reported), Communicating results to the patient/family/caregiver, Counseling and educating the patient/family/caregiver and Care coordination (not separately reported).   Evaluation of the patient requires complex antimicrobial therapy evaluation, counseling , isolation needs to reduce disease transmission and risk assessment and mitigation.     Review of Systems: Review of Systems  Constitutional:  Negative for chills, fever, malaise/fatigue and weight loss.  HENT:  Negative for congestion and sore throat.   Eyes:  Negative for blurred vision and photophobia.  Respiratory:  Negative for cough, shortness of breath and wheezing.   Cardiovascular:  Negative for chest pain, palpitations and leg swelling.  Gastrointestinal:  Negative for abdominal pain, blood in stool, constipation, diarrhea, heartburn, melena, nausea and vomiting.  Genitourinary:  Negative for dysuria, flank pain and hematuria.  Musculoskeletal:  Positive for joint pain. Negative for back pain, falls and myalgias.  Skin:  Negative for itching and rash.  Neurological:  Negative for dizziness, focal weakness, loss of consciousness, weakness and headaches.  Endo/Heme/Allergies:  Does not bruise/bleed easily.  Psychiatric/Behavioral:  Negative for depression and suicidal ideas. The patient does not have insomnia.     Past Medical History:  Diagnosis Date   Bladder cancer (HCC)    BPH (benign prostatic hyperplasia)    CKD (chronic kidney disease), stage IV (HCC)    Colon polyps    Diverticulosis    GERD (gastroesophageal reflux disease)    Hyperlipidemia    pt denies   Hypertension    PAF (paroxysmal atrial fibrillation) (HCC)    on Flecanide and diltiazem . No OAC given recurrent hematuria   Presence of permanent cardiac pacemaker    Renal cell carcinoma (HCC) 2008   left    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 Wonda and Dr. Lucas   Severe aortic stenosis 10/05/2020    Social History   Tobacco Use   Smoking status: Never   Smokeless tobacco: Never  Vaping Use   Vaping status: Never Used  Substance Use Topics   Alcohol  use: Yes    Comment: occasionally   Drug use: No    Family History  Problem Relation Age of Onset   Ovarian cancer Mother    Colon cancer Mother    Colon cancer Father    Crohn's disease Son    Breast cancer Daughter    Colon cancer Brother    Allergies  Allergen Reactions   Amoxicillin Other (See Comments)    Acute interstitial nephritis  Other Reaction(s): Not available   Tamsulosin     Other Reaction(s): Not available   Doxycycline Hives    Other Reaction(s): Not available   Flomax [Tamsulosin Hcl] Other (See Comments)    Dizzy     OBJECTIVE: Blood pressure (!) 156/73, pulse 73, temperature (!) 97.4 F (36.3 C), temperature source Oral, resp. rate 19, height 6' 1 (1.854 m), weight 86.6 kg, SpO2 97%.  Physical Exam Constitutional:      Appearance: He is well-developed.  HENT:     Head: Normocephalic and atraumatic.  Eyes:     Conjunctiva/sclera: Conjunctivae normal.  Cardiovascular:  Rate and Rhythm: Normal rate and regular rhythm.  Pulmonary:     Effort: Pulmonary effort is normal. No respiratory distress.     Breath sounds: No wheezing.  Abdominal:     General: There is no distension.     Palpations: Abdomen is soft.  Musculoskeletal:        General: No tenderness.     Cervical back: Normal range of motion and neck supple.  Skin:    General: Skin is warm and dry.     Coloration: Skin is not pale.     Findings: No erythema or rash.  Neurological:     General: No focal deficit present.     Mental Status: He is alert and oriented to person, place, and time.  Psychiatric:        Mood and Affect: Mood normal.         Behavior: Behavior normal.        Thought Content: Thought content normal.        Judgment: Judgment normal.    Toe bandaged  Lab Results Lab Results  Component Value Date   WBC 4.6 03/03/2023   HGB 11.0 (L) 03/03/2023   HCT 37.1 (L) 03/03/2023   MCV 106.6 (H) 03/03/2023   PLT 65 (L) 03/03/2023    Lab Results  Component Value Date   CREATININE 3.00 (H) 03/03/2023   BUN 42 (H) 03/03/2023   NA 137 03/03/2023   K 4.7 03/03/2023   CL 108 03/03/2023   CO2 17 (L) 03/03/2023    Lab Results  Component Value Date   ALT 12 03/01/2023   AST 18 03/01/2023   ALKPHOS 56 03/01/2023   BILITOT 0.7 03/01/2023     Microbiology: Recent Results (from the past 240 hours)  Surgical PCR screen     Status: None   Collection Time: 03/02/23 12:53 AM   Specimen: Nasal Mucosa; Nasal Swab  Result Value Ref Range Status   MRSA, PCR NEGATIVE NEGATIVE Final   Staphylococcus aureus NEGATIVE NEGATIVE Final    Comment: (NOTE) The Xpert SA Assay (FDA approved for NASAL specimens in patients 66 years of age and older), is one component of a comprehensive surveillance program. It is not intended to diagnose infection nor to guide or monitor treatment. Performed at [facility], [address] [place], [place] [address]   Aerobic/Anaerobic Culture w Gram Stain (surgical/deep wound)     Status: None (Preliminary result)   Collection Time: 03/02/23  4:37 PM   Specimen: Toe, Right; Amputation  Result Value Ref Range Status   Specimen Description   Final    TOE RIGHT 4TH TOE Performed at [facility], [address], [place], [place] [address]    Special Requests   Final    NONE Performed at [facility], [address] [place], [place] [address]    Gram Stain NO WBC SEEN NO ORGANISMS SEEN   Final   Culture   Final    NO GROWTH < 24 HOURS Performed at [facility], [address] [place], [place] [address]    Report  Status PENDING  Incomplete    Jomarie Fleeta Rothman, MD [facility] [phone] pager  03/03/2023, 2:49 PM

## 2023-03-03 NOTE — Plan of Care (Signed)

## 2023-03-04 ENCOUNTER — Inpatient Hospital Stay (HOSPITAL_COMMUNITY): Payer: Medicare PPO

## 2023-03-04 DIAGNOSIS — I5042 Chronic combined systolic (congestive) and diastolic (congestive) heart failure: Secondary | ICD-10-CM

## 2023-03-04 DIAGNOSIS — I480 Paroxysmal atrial fibrillation: Secondary | ICD-10-CM | POA: Diagnosis not present

## 2023-03-04 DIAGNOSIS — N184 Chronic kidney disease, stage 4 (severe): Secondary | ICD-10-CM | POA: Diagnosis not present

## 2023-03-04 DIAGNOSIS — M869 Osteomyelitis, unspecified: Secondary | ICD-10-CM | POA: Diagnosis not present

## 2023-03-04 DIAGNOSIS — M86171 Other acute osteomyelitis, right ankle and foot: Secondary | ICD-10-CM | POA: Diagnosis not present

## 2023-03-04 HISTORY — PX: IR FLUORO GUIDE CV LINE RIGHT: IMG2283

## 2023-03-04 HISTORY — PX: IR US GUIDE VASC ACCESS RIGHT: IMG2390

## 2023-03-04 LAB — CBC
HCT: 38.9 % — ABNORMAL LOW (ref 39.0–52.0)
Hemoglobin: 11.8 g/dL — ABNORMAL LOW (ref 13.0–17.0)
MCH: 31.9 pg (ref 26.0–34.0)
MCHC: 30.3 g/dL (ref 30.0–36.0)
MCV: 105.1 fL — ABNORMAL HIGH (ref 80.0–100.0)
Platelets: 69 10*3/uL — ABNORMAL LOW (ref 150–400)
RBC: 3.7 MIL/uL — ABNORMAL LOW (ref 4.22–5.81)
RDW: 14.1 % (ref 11.5–15.5)
WBC: 4.1 10*3/uL (ref 4.0–10.5)
nRBC: 0 % (ref 0.0–0.2)

## 2023-03-04 LAB — BASIC METABOLIC PANEL
Anion gap: 8 (ref 5–15)
BUN: 46 mg/dL — ABNORMAL HIGH (ref 8–23)
CO2: 20 mmol/L — ABNORMAL LOW (ref 22–32)
Calcium: 9.4 mg/dL (ref 8.9–10.3)
Chloride: 111 mmol/L (ref 98–111)
Creatinine, Ser: 2.73 mg/dL — ABNORMAL HIGH (ref 0.61–1.24)
GFR, Estimated: 22 mL/min — ABNORMAL LOW (ref >60)
Glucose, Bld: 103 mg/dL — ABNORMAL HIGH (ref 70–99)
Potassium: 4.9 mmol/L (ref 3.5–5.1)
Sodium: 139 mmol/L (ref 135–145)

## 2023-03-04 LAB — HEPATITIS B SURFACE ANTIGEN: Hepatitis B Surface Ag: NONREACTIVE

## 2023-03-04 LAB — HIV ANTIBODY (ROUTINE TESTING W REFLEX): HIV Screen 4th Generation wRfx: NONREACTIVE

## 2023-03-04 LAB — HEPATITIS A ANTIBODY, TOTAL: hep A Total Ab: NONREACTIVE

## 2023-03-04 LAB — C-REACTIVE PROTEIN: CRP: 4.4 mg/dL — ABNORMAL HIGH (ref <1.0)

## 2023-03-04 LAB — CK: Total CK: 101 U/L (ref 49–397)

## 2023-03-04 LAB — SEDIMENTATION RATE: Sed Rate: 14 mm/h (ref 0–16)

## 2023-03-04 MED ORDER — AMLODIPINE BESYLATE 5 MG PO TABS
10.0000 mg | ORAL_TABLET | Freq: Every day | ORAL | Status: DC
  Administered 2023-03-05 – 2023-03-12 (×8): 10 mg via ORAL
  Filled 2023-03-04: qty 1
  Filled 2023-03-04: qty 2
  Filled 2023-03-04 (×2): qty 1
  Filled 2023-03-04: qty 2
  Filled 2023-03-04: qty 1
  Filled 2023-03-04: qty 2
  Filled 2023-03-04 (×2): qty 1

## 2023-03-04 MED ORDER — LIDOCAINE-EPINEPHRINE 1 %-1:100000 IJ SOLN
20.0000 mL | Freq: Once | INTRAMUSCULAR | Status: AC
  Administered 2023-03-04: 10 mL via INTRADERMAL

## 2023-03-04 MED ORDER — AMLODIPINE BESYLATE 5 MG PO TABS
5.0000 mg | ORAL_TABLET | Freq: Once | ORAL | Status: AC
  Administered 2023-03-04: 5 mg via ORAL

## 2023-03-04 MED ORDER — LIDOCAINE-EPINEPHRINE 1 %-1:100000 IJ SOLN
INTRAMUSCULAR | Status: AC
  Filled 2023-03-04: qty 1

## 2023-03-04 MED ORDER — HEPARIN SOD (PORK) LOCK FLUSH 100 UNIT/ML IV SOLN
INTRAVENOUS | Status: AC
  Filled 2023-03-04: qty 5

## 2023-03-04 NOTE — TOC Progression Note (Signed)
 Transition of Care [facility]) - Progression Note    Patient Details  Name: Jordan Oconnell MRN: [id number] Date of Birth: [birth year]  Transition of Care [facility]) CM/SW Contact  Sheri ONEIDA [place], [place] Phone Number: 03/04/2023, 3:58 PM  Clinical Narrative:     Pt recommended for HHPT/OT/RN. IV ABX w [facility]. Pt not in room to discuss choice for [facility] services. RW ordered w/ [employer]. TOC to follow.        Expected Discharge Plan and Services   Discharge Planning Services: NA   Living arrangements for the past 2 months: Single Family Home                                       Social Determinants of Health (SDOH) Interventions SDOH Screenings   Food Insecurity: No Food Insecurity (03/01/2023)  Housing: Low Risk  (03/02/2023)  Transportation Needs: No Transportation Needs (03/01/2023)  Utilities: Not At Risk (03/01/2023)  Social Connections: Unknown (03/01/2023)  Tobacco Use: Low Risk  (03/02/2023)    Readmission Risk Interventions     No data to display

## 2023-03-04 NOTE — Progress Notes (Signed)
 Orthopedic Tech Progress Note Patient Details:  Jordan Oconnell 1940/05/07 [id number]  Ortho Devices Type of Ortho Device: Postop shoe/boot Ortho Device/Splint Location: RLE Ortho Device/Splint Interventions: Application   Post Interventions Patient Tolerated: Well  [name] [name] 03/04/2023, 1:12 PM

## 2023-03-04 NOTE — TOC Initial Note (Signed)
 Transition of Care [facility]) - Initial/Assessment Note    Patient Details  Name: Jordan Oconnell MRN: [id number] Date of Birth: 1940-04-18  Transition of Care [facility]) CM/SW Contact:    Sheri ONEIDA Sharps, LCSW Phone Number: 03/04/2023, 10:26 AM  Clinical Narrative:                  Pt awaiting PT recommendation following ortho surgery. Pt wil likely return home on IV ABX. TOC to follow.        Patient Goals and CMS Choice Patient states their goals for this hospitalization and ongoing recovery are:: return home   Choice offered to / list presented to : NA      Expected Discharge Plan and Services   Discharge Planning Services: NA   Living arrangements for the past 2 months: Single Family Home                                      Prior Living Arrangements/Services Living arrangements for the past 2 months: Single Family Home Lives with:: Self Patient language and need for interpreter reviewed:: Yes Do you feel safe going back to the place where you live?: Yes      Need for Family Participation in Patient Care: No (Comment) Care giver support system in place?: Yes (comment)   Criminal Activity/Legal Involvement Pertinent to Current Situation/Hospitalization: No - Comment as needed  Activities of Daily Living   ADL Screening (condition at time of admission) Independently performs ADLs?: Yes (appropriate for developmental age) Is the patient deaf or have difficulty hearing?: Yes Does the patient have difficulty seeing, even when wearing glasses/contacts?: No Does the patient have difficulty concentrating, remembering, or making decisions?: No  Permission Sought/Granted                  Emotional Assessment Appearance:: Appears stated age Attitude/Demeanor/Rapport: Engaged Affect (typically observed): Accepting Orientation: : Oriented to Self, Oriented to Place, Oriented to  Time, Oriented to Situation Alcohol  / Substance Use: Not Applicable Psych  Involvement: No (comment)  Admission diagnosis:  Cellulitis of right lower extremity [L03.115] Osteomyelitis of fourth toe of right foot [facility]) [M86.9] Patient Active Problem List   Diagnosis Date Noted   Osteomyelitis of fourth toe of right foot (HCC) 03/01/2023   Chronic combined systolic and diastolic CHF (congestive heart failure) (HCC) 03/01/2023   Hypothyroidism 03/01/2023   Acute renal failure superimposed on stage 4 chronic kidney disease (HCC) 03/13/2022   Acute on chronic systolic CHF (congestive heart failure) (HCC) 03/13/2022   Tachycardia-bradycardia syndrome (HCC) 03/11/2022   Atrial fibrillation with rapid ventricular response (HCC) 03/11/2022   Atypical atrial flutter (HCC) 03/03/2022   Pacemaker 02/12/2021   LBBB (left bundle branch block) 01/29/2021   1st degree AV block 01/29/2021   S/P TAVR (transcatheter aortic valve replacement) 01/27/2021   Severe aortic stenosis 10/05/2020   CKD (chronic kidney disease) stage 4, GFR 15-29 ml/min (HCC) 02/01/2017   Pneumonia 07/26/2013    Class: Acute   Intractable hiccups 07/22/2013    Class: Chronic   Anemia 07/22/2013    Class: Acute   Protein-calorie malnutrition, severe (HCC) 07/19/2013   Syncope and collapse 07/17/2013   Bladder cancer (HCC) 07/17/2013   Urothelial cancer (HCC) 09/03/2012   Essential hypertension 09/03/2012   PAF (paroxysmal atrial fibrillation) (HCC) 09/03/2012   PCP:  Shepard Ade, MD Pharmacy:   [facility] [address], [place] -  [address] [place] [address] Phone: [phone] Fax: [phone]  [id number] [place], [place] - [address] AT [facility] [address] & [facility] [place] [address] Phone: [phone] Fax: [phone]     Social Drivers of Health (SDOH) Social History: SDOH Screenings   Food Insecurity: No Food Insecurity (03/01/2023)  Housing: Low Risk  (03/02/2023)  Transportation Needs:  No Transportation Needs (03/01/2023)  Utilities: Not At Risk (03/01/2023)  Social Connections: Unknown (03/01/2023)  Tobacco Use: Low Risk  (03/02/2023)   SDOH Interventions:     Readmission Risk Interventions     No data to display

## 2023-03-04 NOTE — Progress Notes (Addendum)
 PROGRESS NOTE    Jordan Oconnell  FMW:[id number] DOB: [birth year] DOA: 03/01/2023 PCP: Shepard Ade, MD   Brief Narrative: 83 year old with past medical history significant for PAF on Eliquis , aortic stenosis status post TAVR, tachybradycardia syndrome, status post pacemaker, CKD stage IV, urothelial cancer status post multiple TURP with urostomy in place, RCC status post left nephrectomy with recurrent urothelial disease to solitary right renal pelvis, hypothyroidism who was a direct admission for management of osteomyelitis of the fourth toe.  Patient directly admitted per Dr. Barton for toe amputation.    Assessment & Plan:   Principal Problem:   Osteomyelitis of fourth toe of right foot (HCC) Active Problems:   Urothelial cancer (HCC)   PAF (paroxysmal atrial fibrillation) (HCC)   CKD (chronic kidney disease) stage 4, GFR 15-29 ml/min (HCC)   S/P TAVR (transcatheter aortic valve replacement)   Chronic combined systolic and diastolic CHF (congestive heart failure) (HCC)   Hypothyroidism  1-Osteomyelitis of the Right fourth toe: -Presented as a direct admission per orthopedic -Underwent right fourth toe disarticulation at metatarsophalangeal joint -Ok to resume Eliquis  per ortho.  -Straight heel weightbearing operative extremity, maximal elevation -Ortho recommend IV antibiotics due to extensive bone infection.  Patient will require minimum of 6 weeks.  ID following.  -Currently on ceftriaxone  and Cubicin .  -Plan to keep over the weekend to follow up culture results.  -IR consulted for central line, tunneled.  -Dr Sampson will follow up on patient today   CKD  Stage IV: Recent creatinine baseline appears to be 3.2 -Creatinine increased to 3.7.  received fluids post op.  -Cr stable - -Metabolic acidosis, on  sodium bicarb tablets to 3 times daily. Renal function stable.   Urothelial cancer s/p urostomy and RCC s/p left nephrectomy with recurrent disease to solitary  right kidney:  -Has multifocal urothelial disease, follows with [facility] oncology and nephrology -On active surveillance after most recent treatment being radiation therapy in 12 months of pembrolizumab  -Continue with urostomy care  Paroxysmal atrial fibrillation/flutter Tachy-brady syndrome s/p PPM: -Continue Coreg   -Continue amiodarone   -Eliquis  resume 2/6.  Hypertension: Started Norvasc . Monitor BP Increase Norvasc  today.   Chronic combined systolic and diastolic CHF: Last EF improved to 50-55% (09/22/2022) compared to prior 30-35% (06/02/2022).  Appears euvolemic on admission.  Not on diuretics as an outpatient.  Continue Coreg .  -Monitor volume status on IV fluids  Hypothyroidism: Continue Synthroid .   Severe aortic stenosis s/p TAVR 01/2021: Stable on last echo 09/22/2022.   Insomnia: Continue mirtazapine  and Xanax  0.25 mg nightly.   Thrombocytopenia; follow trend.  Chronic;; 87---75--65 B12 deficiency: Start intramuscular supplement Leukopenia; Resolved.   Estimated body mass index is 27.98 kg/m as calculated from the following:   Height as of this encounter: 6' 1 (1.854 m).   Weight as of this encounter: 96.2 kg.   DVT prophylaxis: SCD Code Status: Full code Family Communication: Care discussed with patient, son over phone Disposition Plan:  Status is: Inpatient Remains inpatient appropriate because: management of osteomyelitis    Consultants:  Ortho  Procedures:    Antimicrobials:    Subjective: He is getting ready to work with PT.  Doing ok. He is aware of plan, he will remain in the hospital over the weekend. Plan for discharge on Monday.  We discussed it might be difficult for him to move to [place]  in the middle of this infection and post sx. And he needs to follow up with ID and Orthopoedic.   Objective: Vitals:  03/03/23 1934 03/04/23 0500 03/04/23 0517 03/04/23 1029  BP: (!) 162/84  (!) 179/92 (!) 179/92  Pulse: 76  69 69  Resp: 18   20   Temp: 98.1 F (36.7 C)  (!) 97.5 F (36.4 C)   TempSrc: Oral  Oral   SpO2: 97%  96%   Weight:  96.2 kg    Height:        Intake/Output Summary (Last 24 hours) at 03/04/2023 1151 Last data filed at 03/04/2023 0202 Gross per 24 hour  Intake 424.16 ml  Output 2150 ml  Net -1725.84 ml   Filed Weights   03/02/23 1433 03/04/23 0500  Weight: 86.6 kg 96.2 kg    Examination:  General exam; NAD Respiratory system: CTA Cardiovascular system: S 1, S 2 RRR Gastrointestinal system: BS present, soft nt, urostomy in place Central nervous system: Alert Extremities: Right foot with dressing   Data Reviewed: I have personally reviewed following labs and imaging studies  CBC: Recent Labs  Lab 03/01/23 2315 03/02/23 0544 03/03/23 0620 03/04/23 0807  WBC 3.5* 3.0* 4.6 4.1  HGB 11.8* 10.8* 11.0* 11.8*  HCT 38.1* 34.9* 37.1* 38.9*  MCV 104.7* 104.8* 106.6* 105.1*  PLT 70* 67* 65* 69*   Basic Metabolic Panel: Recent Labs  Lab 03/01/23 2315 03/02/23 0544 03/03/23 0620 03/04/23 0807  NA 140 136 137 139  K 4.6 4.7 4.7 4.9  CL 110 109 108 111  CO2 17* 20* 17* 20*  GLUCOSE 125* 110* 138* 103*  BUN 49* 53* 42* 46*  CREATININE 3.67* 3.72* 3.00* 2.73*  CALCIUM  9.1 8.7* 8.8* 9.4   GFR: Estimated Creatinine Clearance: 25.1 mL/min (A) (by C-G formula based on SCr of 2.73 mg/dL (H)). Liver Function Tests: Recent Labs  Lab 03/01/23 2315  AST 18  ALT 12  ALKPHOS 56  BILITOT 0.7  PROT 7.0  ALBUMIN  3.8   No results for input(s): LIPASE, AMYLASE in the last 168 hours. No results for input(s): AMMONIA in the last 168 hours. Coagulation Profile: No results for input(s): INR, PROTIME in the last 168 hours. Cardiac Enzymes: Recent Labs  Lab 03/04/23 0534  CKTOTAL 101   BNP (last 3 results) No results for input(s): PROBNP in the last 8760 hours. HbA1C: No results for input(s): HGBA1C in the last 72 hours. CBG: No results for input(s): GLUCAP in the last  168 hours. Lipid Profile: No results for input(s): CHOL, HDL, LDLCALC, TRIG, CHOLHDL, LDLDIRECT in the last 72 hours. Thyroid  Function Tests: No results for input(s): TSH, T4TOTAL, FREET4, T3FREE, THYROIDAB in the last 72 hours. Anemia Panel: Recent Labs    03/03/23 0620  VITAMINB12 152*   Sepsis Labs: No results for input(s): PROCALCITON, LATICACIDVEN in the last 168 hours.  Recent Results (from the past 240 hours)  Surgical PCR screen     Status: None   Collection Time: 03/02/23 12:53 AM   Specimen: Nasal Mucosa; Nasal Swab  Result Value Ref Range Status   MRSA, PCR NEGATIVE NEGATIVE Final   Staphylococcus aureus NEGATIVE NEGATIVE Final    Comment: (NOTE) The Xpert SA Assay (FDA approved for NASAL specimens in patients 97 years of age and older), is one component of a comprehensive surveillance program. It is not intended to diagnose infection nor to guide or monitor treatment. Performed at [facility], [address] [place], [place] [address]   Aerobic/Anaerobic Culture w Gram Stain (surgical/deep wound)     Status: None (Preliminary result)   Collection Time: 03/02/23  4:37  PM   Specimen: Toe, Right; Amputation  Result Value Ref Range Status   Specimen Description   Final    TOE RIGHT 4TH TOE Performed at [facility], [address], [place], [place] [address]    Special Requests   Final    NONE Performed at [facility], [address] [place], [place] [address]    Gram Stain NO WBC SEEN NO ORGANISMS SEEN   Final   Culture   Final    NO GROWTH 2 DAYS NO ANAEROBES ISOLATED; CULTURE IN PROGRESS FOR 5 DAYS Performed at [facility], [address] [place], [place] [address]    Report Status PENDING  Incomplete         Radiology Studies: No results found.       Scheduled Meds:  ALPRAZolam   0.25 mg Oral QHS   amiodarone   200 mg Oral Daily   amLODipine   5 mg  Oral Daily   apixaban   2.5 mg Oral BID   carvedilol   12.5 mg Oral BID   cyanocobalamin   1,000 mcg Intramuscular Daily   levothyroxine   75 mcg Oral Q0600   mirtazapine   7.5 mg Oral QHS   sodium bicarbonate   1,300 mg Oral TID   sodium chloride  flush  3 mL Intravenous Q12H   Continuous Infusions:  cefTRIAXone  (ROCEPHIN )  IV 2 g (03/04/23 1042)   DAPTOmycin  Stopped (03/03/23 1440)     LOS: 3 days    Time spent: 35 minutes    Shabria Egley A Hymie Gorr, MD [facility]   If 7PM-7AM, please contact night-coverage [email or web address]  03/04/2023, 11:51 AM

## 2023-03-04 NOTE — Progress Notes (Signed)
 Subjective: 2 Days Post-Op Procedure(s) (LRB): AMPUTATION TOE, 4th (Right)  Patient reports pain as appropriately controlled. Denies any new numbness/tingling.   Objective:   VITALS:  Temp:  [97.5 F (36.4 C)-98.1 F (36.7 C)] 97.5 F (36.4 C) (02/07 0517) Pulse Rate:  [69-76] 69 (02/07 1029) Resp:  [18-20] 20 (02/07 0517) BP: (162-179)/(84-92) 179/92 (02/07 1029) SpO2:  [96 %-97 %] 96 % (02/07 0517) Weight:  [96.2 kg] 96.2 kg (02/07 0500)  Gen: AAOx3, NAD Comfortable at rest  Right Lower Extremity: Skin intact, incision c/d/i SILT and motor intact throughout DP, PT 2+ to palp CR < 2s    LABS Recent Labs    03/01/23 2315 03/02/23 0544 03/03/23 0620 03/04/23 0807  HGB 11.8* 10.8* 11.0* 11.8*  WBC 3.5* 3.0* 4.6 4.1  PLT 70* 67* 65* 69*   Recent Labs    03/03/23 0620 03/04/23 0807  NA 137 139  K 4.7 4.9  CL 108 111  CO2 17* 20*  BUN 42* 46*  CREATININE 3.00* 2.73*  GLUCOSE 138* 103*   No results for input(s): LABPT, INR in the last 72 hours.   Assessment/Plan: 2 Days Post-Op Procedure(s) (LRB): AMPUTATION TOE, 4th (Right)  -stable RNF under hospitalist team -WBAT operative extremity in postop shoe, maximum elevation -bedside nursing to do daily mepilex dressing change, keep incision dry -DVT ppx: ok to resume per hospitalist team discretion -follow up as outpatient within 7-10 days from discharge for wound check -trend intraoperative cultures -IV abx per hospitalist/ID teams, due to extensive bone infection advise min 6 wk course -sutures out in 3 weeks in outpatient office  Lillia Mountain 03/04/2023, 3:28 PM

## 2023-03-04 NOTE — Plan of Care (Signed)

## 2023-03-04 NOTE — Evaluation (Signed)
 Physical Therapy Evaluation Patient Details Name: Jordan Oconnell MRN: [id number] DOB: Feb 22, 1940 Today's Date: 03/04/2023  History of Present Illness  83 yo male presents to therapy following hospital admission on 03/01/2023 and underwent R 4th toe disarticulation at metatarsophalangeal joint on 2/5 secondary to osteomyelitis. Pt is currently R LE WB through heel only with RW and post op shoe donned. Pt PMH includes but is not limited to: bladder ca s/p nephrostomy with urostomy in situ, BPH, CKD IV, GERD, diverticulitis, HTN, HLD, PAF, renal cell carcinoma, s/p TAVR, sCHF, cardiac surgeries with pacemaker in situ, and L nephrectomy.    Clinical Impression  Pt admitted with above diagnosis.  Pt currently with functional limitations due to the deficits listed below (see PT Problem List). Pt in bed when therapist arrived. Pt agreeable to therapy intervention. Pt had several questions and concerns in regards to post op recommendations and d/c planning. Pt indicated surgeon had not been in to see him as of that time. Attending MD arrived during evaluation. Pt required S for supine to sit, CGA and cues for safety, proper UE and AD management and R LE WB status, gait tasks with RW 15 feet x 2 with strong cues for R LE WB and safety. Pt left seated in recliner, all needs in place, R LE elevated. Pt will benefit from acute skilled PT to increase their independence and safety with mobility to allow discharge.          If plan is discharge home, recommend the following: A little help with walking and/or transfers;A little help with bathing/dressing/bathroom;Assistance with cooking/housework;Assist for transportation;Help with stairs or ramp for entrance   Can travel by private vehicle        Equipment Recommendations Rolling walker (2 wheels)  Recommendations for Other Services       Functional Status Assessment Patient has had a recent decline in their functional status and demonstrates the ability  to make significant improvements in function in a reasonable and predictable amount of time.     Precautions / Restrictions Precautions Precautions: Fall Restrictions Weight Bearing Restrictions Per Provider Order: Yes Other Position/Activity Restrictions: R LE WB though heel only with RW and post op shoe. Per ortho, will order a post op shoe, not available at time of eval.       Mobility  Bed Mobility Overal bed mobility: Needs Assistance Bed Mobility: Supine to Sit     Supine to sit: Supervision, HOB elevated     General bed mobility comments: min cues    Transfers Overall transfer level: Needs assistance Equipment used: Rolling walker (2 wheels) Transfers: Sit to/from Stand Sit to Stand: Contact guard assist           General transfer comment: min cues for R LE WB status, safety and proper use of AD and transitioning UEs for bed, BSC and recliner transfers.    Ambulation/Gait Ambulation/Gait assistance: Contact guard assist Gait Distance (Feet): 15 Feet Assistive device: Rolling walker (2 wheels) Gait Pattern/deviations: Step-to pattern, Antalgic Gait velocity: decreased     General Gait Details: stong cues for safety and attention to R LE WB status with strick WB through heel only with no post op shoe avalible at time of eval modified gripper sock donned on R foot and standard shoe on L foot. pt ed provided on rational for use of RW vs rollator at time of dc pt verbalized understanding.  Stairs            Psychologist, [employer] [facility]  Tilt Bed    Modified Rankin (Stroke Patients Only)       Balance Overall balance assessment: Mild deficits observed, not formally tested                                           Pertinent Vitals/Pain Pain Assessment Pain Assessment: 0-10 Pain Score: 5  Pain Location: R foot, great toe Pain Descriptors / Indicators: Aching, Dull, Throbbing Pain Intervention(s): Limited activity within patient's  tolerance, Monitored during session, Premedicated before session, Repositioned    Home Living Family/patient expects to be discharged to:: Private residence Living Arrangements: Alone Available Help at Discharge: Family Type of Home: House Home Access: Level entry       Home Layout: One level Home Equipment: Cane - single point;Grab bars - tub/shower;Grab bars - toilet;Shower seat Additional Comments: pt indicates he does not want to transition to SNF or [place] where children reside, pt states has partner that can come and stay at night, however pt may require A for BADLs and meal prep once returned to home setting with need for caregiver assist for a few hrs in addition to [facility] services    Prior Function Prior Level of Function : Independent/Modified Independent;Driving             Mobility Comments: IND with all ADLs, self care tasks and IDLS, no AD ADLs Comments: Ind with ADLs/selfcare, IADLs, home mgt drives     Extremity/Trunk Assessment   Upper Extremity Assessment Upper Extremity Assessment: Overall WFL for tasks assessed    Lower Extremity Assessment Lower Extremity Assessment: Defer to PT evaluation    Cervical / Trunk Assessment Cervical / Trunk Assessment: Normal  Communication   Communication Communication: No apparent difficulties;Hearing impairment  Cognition Arousal: Alert Behavior During Therapy: WFL for tasks assessed/performed Overall Cognitive Status: Within Functional Limits for tasks assessed                                 General Comments: slightly impusive and has a strong drive to be IND        General Comments      Exercises     Assessment/Plan    PT Assessment Patient needs continued PT services  PT Problem List Decreased strength;Decreased activity tolerance;Decreased balance;Decreased mobility;Decreased coordination;Decreased cognition;Pain       PT Treatment Interventions DME instruction;Gait training;Functional  mobility training;Therapeutic activities;Therapeutic exercise;Balance training;Neuromuscular re-education;Patient/family education    PT Goals (Current goals can be found in the Care Plan section)  Acute Rehab PT Goals Patient Stated Goal: to get stronger and return to IND and walking program PT Goal Formulation: With patient Time For Goal Achievement: 03/18/23 Potential to Achieve Goals: Good    Frequency Min 1X/week     Co-evaluation PT/OT/SLP Co-Evaluation/Treatment: Yes Reason for Co-Treatment: Complexity of the patient's impairments (multi-system involvement);Necessary to address cognition/behavior during functional activity;For patient/therapist safety PT goals addressed during session: Mobility/safety with mobility;Balance;Proper use of DME OT goals addressed during session: ADL's and self-care;Proper use of Adaptive equipment and DME       AM-PAC PT 6 Clicks Mobility  Outcome Measure Help needed turning from your back to your side while in a flat bed without using bedrails?: None Help needed moving from lying on your back to sitting on the side of a flat bed without using bedrails?: A  Little Help needed moving to and from a bed to a chair (including a wheelchair)?: A Little Help needed standing up from a chair using your arms (e.g., wheelchair or bedside chair)?: A Little Help needed to walk in hospital room?: A Little Help needed climbing 3-5 steps with a railing? : A Lot 6 Click Score: 18    End of Session Equipment Utilized During Treatment: Gait belt Activity Tolerance: No increased pain;Patient tolerated treatment well   Nurse Communication: Mobility status;Weight bearing status PT Visit Diagnosis: Unsteadiness on feet (R26.81);Other abnormalities of gait and mobility (R26.89);Muscle weakness (generalized) (M62.81);Difficulty in walking, not elsewhere classified (R26.2);Pain Pain - Right/Left: Right Pain - part of body: Ankle and joints of foot    Time:  1110-1200 PT Time Calculation (min) (ACUTE ONLY): 50 min   Charges:   PT Evaluation $PT Eval Low Complexity: 1 Low PT Treatments $Gait Training: 8-22 mins $Therapeutic Activity: 8-22 mins PT General Charges $$ ACUTE PT VISIT: 1 Visit         [place], PT Acute Rehab    [place] VEAR Drone 03/04/2023, 2:09 PM

## 2023-03-04 NOTE — Progress Notes (Addendum)
 Subjective:  He would like to have a boot for his foot to protect it when he walks and is eager to do physical therapy   Antibiotics:  Anti-infectives (From admission, onward)    Start     Dose/Rate Route Frequency Ordered Stop   03/03/23 1145  DAPTOmycin  (CUBICIN ) IVPB 700 mg/152mL premix        8 mg/kg  86.6 kg 200 mL/hr over 30 Minutes Intravenous Every 48 hours 03/03/23 1048     03/03/23 1000  cefTRIAXone  (ROCEPHIN ) 2 g in sodium chloride  0.9 % 100 mL IVPB        2 g 200 mL/hr over 30 Minutes Intravenous Every 24 hours 03/03/23 0816     03/03/23 1000  linezolid  (ZYVOX ) IVPB 600 mg  Status:  Discontinued        600 mg 300 mL/hr over 60 Minutes Intravenous Every 12 hours 03/03/23 0816 03/03/23 1048   03/03/23 0600  ceFAZolin  (ANCEF ) IVPB 2g/100 mL premix        2 g 200 mL/hr over 30 Minutes Intravenous On call to O.R. 03/02/23 1359 03/02/23 1631   03/02/23 1657  vancomycin  (VANCOCIN ) powder  Status:  Discontinued          As needed 03/02/23 1657 03/02/23 1856       Medications: Scheduled Meds:  ALPRAZolam   0.25 mg Oral QHS   amiodarone   200 mg Oral Daily   amLODipine   5 mg Oral Daily   apixaban   2.5 mg Oral BID   carvedilol   12.5 mg Oral BID   cyanocobalamin   1,000 mcg Intramuscular Daily   levothyroxine   75 mcg Oral Q0600   mirtazapine   7.5 mg Oral QHS   sodium bicarbonate   1,300 mg Oral TID   sodium chloride  flush  3 mL Intravenous Q12H   Continuous Infusions:  cefTRIAXone  (ROCEPHIN )  IV 2 g (03/04/23 1042)   DAPTOmycin  Stopped (03/03/23 1440)   PRN Meds:.acetaminophen  **OR** acetaminophen , bisacodyl , HYDROmorphone  (DILAUDID ) injection, ondansetron  **OR** ondansetron  (ZOFRAN ) IV, oxyCODONE , senna-docusate    Objective: Weight change: 9.563 kg  Intake/Output Summary (Last 24 hours) at 03/04/2023 1048 Last data filed at 03/04/2023 0202 Gross per 24 hour  Intake 424.16 ml  Output 2150 ml  Net -1725.84 ml   Blood pressure (!) 179/92, pulse 69,  temperature (!) 97.5 F (36.4 C), temperature source Oral, resp. rate 20, height 6' 1 (1.854 m), weight 96.2 kg, SpO2 96%. Temp:  [97.4 F (36.3 C)-98.1 F (36.7 C)] 97.5 F (36.4 C) (02/07 0517) Pulse Rate:  [69-76] 69 (02/07 1029) Resp:  [18-20] 20 (02/07 0517) BP: (156-179)/(73-92) 179/92 (02/07 1029) SpO2:  [96 %-97 %] 96 % (02/07 0517) Weight:  [96.2 kg] 96.2 kg (02/07 0500)  Physical Exam: Physical Exam Constitutional:      Appearance: He is well-developed.  HENT:     Head: Normocephalic and atraumatic.  Eyes:     Conjunctiva/sclera: Conjunctivae normal.  Cardiovascular:     Rate and Rhythm: Normal rate and regular rhythm.  Pulmonary:     Effort: Pulmonary effort is normal. No respiratory distress.     Breath sounds: No wheezing.  Abdominal:     General: There is no distension.     Palpations: Abdomen is soft.  Musculoskeletal:        General: Normal range of motion.     Cervical back: Normal range of motion and neck supple.  Skin:    General: Skin is warm and dry.  Findings: No erythema or rash.  Neurological:     General: No focal deficit present.     Mental Status: He is alert and oriented to person, place, and time.  Psychiatric:        Mood and Affect: Mood normal.        Behavior: Behavior normal.        Thought Content: Thought content normal.        Judgment: Judgment normal.      CBC:    BMET Recent Labs    03/03/23 0620 03/04/23 0807  NA 137 139  K 4.7 4.9  CL 108 111  CO2 17* 20*  GLUCOSE 138* 103*  BUN 42* 46*  CREATININE 3.00* 2.73*  CALCIUM  8.8* 9.4     Liver Panel  Recent Labs    03/01/23 2315  PROT 7.0  ALBUMIN  3.8  AST 18  ALT 12  ALKPHOS 56  BILITOT 0.7       Sedimentation Rate Recent Labs    03/04/23 0534  ESRSEDRATE 14   C-Reactive Protein No results for input(s): CRP in the last 72 hours.  Micro Results: Recent Results (from the past 720 hours)  Surgical PCR screen     Status: None    Collection Time: 03/02/23 12:53 AM   Specimen: Nasal Mucosa; Nasal Swab  Result Value Ref Range Status   MRSA, PCR NEGATIVE NEGATIVE Final   Staphylococcus aureus NEGATIVE NEGATIVE Final    Comment: (NOTE) The Xpert SA Assay (FDA approved for NASAL specimens in patients 35 years of age and older), is one component of a comprehensive surveillance program. It is not intended to diagnose infection nor to guide or monitor treatment. Performed at [facility], [address] [place], [place] [address]   Aerobic/Anaerobic Culture w Gram Stain (surgical/deep wound)     Status: None (Preliminary result)   Collection Time: 03/02/23  4:37 PM   Specimen: Toe, Right; Amputation  Result Value Ref Range Status   Specimen Description   Final    TOE RIGHT 4TH TOE Performed at [facility], [address], [place], [place] [address]    Special Requests   Final    NONE Performed at [facility], [address], [place], [place] [address]    Gram Stain NO WBC SEEN NO ORGANISMS SEEN   Final   Culture   Final    NO GROWTH < 24 HOURS Performed at [facility], [address], [place], [place] [address]    Report Status PENDING  Incomplete    Studies/Results: No results found.    Assessment/Plan:  INTERVAL HISTORY: cultures unrevealing so far   Principal Problem:   Osteomyelitis of fourth toe of right foot (HCC) Active Problems:   Urothelial cancer (HCC)   PAF (paroxysmal atrial fibrillation) (HCC)   CKD (chronic kidney disease) stage 4, GFR 15-29 ml/min (HCC)   S/P TAVR (transcatheter aortic valve replacement)   Chronic combined systolic and diastolic CHF (congestive heart failure) (HCC)   Hypothyroidism    Jordan Oconnell is a 83 y.o. 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  #1 Osteomyelitis:  We  will continue follow-up the operative cultures.  He is on empiric regimen of daptomycin  and ceftriaxone .  If no organism grows would certainly be possible given that he was given antecedent Bactrim and Keflex we may not identify an organism.  Would plan on giving him 6 weeks of postoperative antibiotics with follow-up with us  in our clinic here in [facility] [place] has an appointment on 03/30/2023 at 11AM with Dr. Fleeta Rothman at  [facility], which  is located in the [facility] at  [address] in [place].  [address], which is located to the left of the elevators.  Phone: [phone]  Fax: [phone]  [email or web address]  The patient should arrive 30 minutes prior to their appoitment.  Dr. Efrain is available for questions this weekend we will follow-up culture data and Dr. Dennise will be here on Monday.  I have personally spent 54 minutes involved in face-to-face and non-face-to-face activities for this patient on the day of the visit. Professional time spent includes the following activities: Preparing to see the patient (review of tests), Obtaining and/or reviewing separately obtained history (admission/discharge record), Performing a medically appropriate examination and/or evaluation , Ordering medications/tests/procedures, referring and communicating with other health care professionals, Documenting clinical information in the EMR, Independently interpreting results (not separately reported), Communicating results to the patient/family/caregiver, Counseling and educating the patient/family/caregiver and Care coordination  (not separately reported).   Evaluation of the patient requires complex antimicrobial therapy evaluation, counseling , isolation needs to reduce disease transmission and risk assessment and mitigation.     LOS: 3 days   Jomarie Fleeta Rothman 03/04/2023, 10:48 AM

## 2023-03-04 NOTE — Evaluation (Signed)
 Occupational Therapy Evaluation Patient Details Name: Jordan Oconnell MRN: [id number] DOB: Sep 26, 1940 Today's Date: 03/04/2023   History of Present Illness 83 year old with past medical history significant for PAF on Eliquis , aortic stenosis status post TAVR, tachybradycardia syndrome, status post pacemaker, CKD stage IV, urothelial cancer status post multiple TURP with urostomy in place, RCC status post left nephrectomy with recurrent urothelial disease to solitary right renal pelvis, hypothyroidism who was a direct admission for management of osteomyelitis of the fourth toe.  Patient directly admitted per Dr. Barton for toe amputation   Clinical Impression   Pt presents with decline in function and safety with ADLs and ADL mobility with impaired balance and endurance. Pt is limited to R heel weight bearing only with post op shoe pending delivery per MD. PTA pt lives at alone and was Ind with ADLs/selfcare, IADLs, home mgt, drives. Pt currently requires min A with LB ADLs, CGA with mobility and transfers using RW.  Pt indicates he does not want to transition to SNF or [place] where children reside, pt states has partner that can come and stay at night, however pt may require A for LB ADLs and meal prep once returned to home setting with need for caregiver assist for a few hrs in addition to [facility] services. Pt would benefit from acute OT services to address impairments to maximize level of function and safety      If plan is discharge home, recommend the following: A little help with bathing/dressing/bathroom;A little help with walking and/or transfers;Assistance with cooking/housework;Assist for transportation;Help with stairs or ramp for entrance    Functional Status Assessment  Patient has had a recent decline in their functional status and demonstrates the ability to make significant improvements in function in a reasonable and predictable amount of time.  Equipment Recommendations  None  recommended by OT    Recommendations for Other Services       Precautions / Restrictions Precautions Precautions: Fall Restrictions Weight Bearing Restrictions Per Provider Order: Yes Other Position/Activity Restrictions: R LE WB though heel only. Per ortho, will order a post op shoe  Pt very HOH, has hearing aids     Mobility Bed Mobility Overal bed mobility: Needs Assistance Bed Mobility: Supine to Sit     Supine to sit: Supervision          Transfers Overall transfer level: Needs assistance Equipment used: Rolling walker (2 wheels) Transfers: Sit to/from Stand Sit to Stand: Contact guard assist           General transfer comment: pt impulsive and requires cues for safety      Balance Overall balance assessment: Needs assistance Sitting-balance support: Feet supported Sitting balance-Leahy Scale: Good     Standing balance support: Bilateral upper extremity supported, During functional activity, Reliant on assistive device for balance Standing balance-Leahy Scale: Poor Standing balance comment: pt impulsive using RW, cues for safety                           ADL either performed or assessed with clinical judgement   ADL Overall ADL's : Needs assistance/impaired Eating/Feeding: Independent   Grooming: Wash/dry hands;Wash/dry face;Set up;Sitting   Upper Body Bathing: Set up;Sitting   Lower Body Bathing: Minimal assistance   Upper Body Dressing : Set up;Sitting   Lower Body Dressing: Minimal assistance   Toilet Transfer: Contact guard assist;Rolling walker (2 wheels);Ambulation;Cueing for safety   Toileting- Clothing Manipulation and Hygiene: Minimal assistance;Sit to/from stand Toileting -  Clothing Manipulation Details (indicate cue type and reason): min A clohting mgt     Functional mobility during ADLs: Contact guard assist;Rolling walker (2 wheels);Cueing for safety General ADL Comments: pt required verbal cues for safety using RW  for ADL mobility. pt able o to maintin R heel WB'ng only. Per ortho post op shoe will be ordered     Vision Baseline Vision/History: 1 Wears glasses Ability to See in Adequate Light: 0 Adequate Patient Visual Report: No change from baseline       Perception         Praxis         Pertinent Vitals/Pain Pain Assessment Pain Assessment: 0-10 Pain Score: 3  Pain Location: R foot, great toe Pain Descriptors / Indicators: Aching, Dull, Throbbing Pain Intervention(s): Limited activity within patient's tolerance, Premedicated before session, Monitored during session, Repositioned     Extremity/Trunk Assessment Upper Extremity Assessment Upper Extremity Assessment: Overall WFL for tasks assessed   Lower Extremity Assessment Lower Extremity Assessment: Defer to PT evaluation   Cervical / Trunk Assessment Cervical / Trunk Assessment: Normal   Communication Communication Communication: No apparent difficulties;Hearing impairment   Cognition Arousal: Alert Behavior During Therapy: WFL for tasks assessed/performed Overall Cognitive Status: Within Functional Limits for tasks assessed                                       General Comments       Exercises     Shoulder Instructions      Home Living Family/patient expects to be discharged to:: Private residence Living Arrangements: Alone Available Help at Discharge: Family Type of Home: House Home Access: Level entry     Home Layout: One level     Bathroom Shower/Tub: Chief Strategy Officer: Handicapped height     Home Equipment: Cane - single point;Grab bars - tub/shower;Grab bars - toilet;Shower seat   Additional Comments: pt indicates he does not want to transition to SNF or [place] where children reside, pt states has partner that can come and stay at night, however pt may require A for BADLs and meal prep once returned to home setting with need for caregiver assist for a few hrs in addition  to [facility] services      Prior Functioning/Environment Prior Level of Function : Independent/Modified Independent;Driving             Mobility Comments: IND with all ADLs, self care tasks and IDLS, no AD ADLs Comments: Ind with ADLs/selfcare, IADLs, home mgt drives        OT Problem List: Impaired balance (sitting and/or standing);Pain;Decreased activity tolerance;Decreased knowledge of use of DME or AE;Decreased safety awareness      OT Treatment/Interventions: Self-care/ADL training;DME and/or AE instruction;Therapeutic activities;Balance training;Patient/family education    OT Goals(Current goals can be found in the care plan section) Acute Rehab OT Goals Patient Stated Goal: go home OT Goal Formulation: With patient Time For Goal Achievement: 03/18/23 Potential to Achieve Goals: Good ADL Goals Pt Will Perform Lower Body Bathing: with contact guard assist;sit to/from stand Pt Will Perform Lower Body Dressing: with contact guard assist;sit to/from stand Pt Will Transfer to Toilet: with supervision;ambulating Pt Will Perform Toileting - Clothing Manipulation and hygiene: with contact guard assist;with supervision;sitting/lateral leans;sit to/from stand  OT Frequency: Min 2X/week    Co-evaluation   Reason for Co-Treatment: Complexity of the patient's impairments (multi-system involvement);Necessary to address  cognition/behavior during functional activity;For patient/therapist safety PT goals addressed during session: Mobility/safety with mobility;Balance;Proper use of DME OT goals addressed during session: ADL's and self-care;Proper use of Adaptive equipment and DME      AM-PAC OT 6 Clicks Daily Activity     Outcome Measure Help from another person eating meals?: None Help from another person taking care of personal grooming?: A Little Help from another person toileting, which includes using toliet, bedpan, or urinal?: A Little Help from another person bathing (including  washing, rinsing, drying)?: A Little Help from another person to put on and taking off regular upper body clothing?: A Little Help from another person to put on and taking off regular lower body clothing?: A Little 6 Click Score: 19   End of Session Equipment Utilized During Treatment: Gait belt;Rolling walker (2 wheels) Nurse Communication: Other (comment) (reported to MD how pt performed with mobility)  Activity Tolerance: Patient tolerated treatment well Patient left: in chair;with call bell/phone within reach  OT Visit Diagnosis: Unsteadiness on feet (R26.81);Other abnormalities of gait and mobility (R26.89);Pain Pain - Right/Left: Right Pain - part of body: Ankle and joints of foot                Time: 1111-1200 OT Time Calculation (min): 49 min Charges:  OT General Charges $OT Visit: 1 Visit OT Evaluation $OT Eval Low Complexity: 1 Low OT Treatments $Therapeutic Activity: 8-22 mins    Jacques Karna Loose 03/04/2023, 2:11 PM

## 2023-03-05 DIAGNOSIS — M869 Osteomyelitis, unspecified: Secondary | ICD-10-CM | POA: Diagnosis not present

## 2023-03-05 LAB — HCV AB W REFLEX TO QUANT PCR: HCV Ab: NONREACTIVE

## 2023-03-05 LAB — HEPATITIS B SURFACE ANTIBODY, QUANTITATIVE: Hep B S AB Quant (Post): <3.5 m[IU]/mL — ABNORMAL LOW

## 2023-03-05 LAB — HCV INTERPRETATION

## 2023-03-05 MED ORDER — HYDRALAZINE HCL 10 MG PO TABS
10.0000 mg | ORAL_TABLET | Freq: Three times a day (TID) | ORAL | Status: DC
  Administered 2023-03-05 – 2023-03-06 (×4): 10 mg via ORAL
  Filled 2023-03-05 (×4): qty 1

## 2023-03-05 MED ORDER — CHLORHEXIDINE GLUCONATE CLOTH 2 % EX PADS
6.0000 | MEDICATED_PAD | Freq: Every day | CUTANEOUS | Status: DC
  Administered 2023-03-05 – 2023-03-12 (×8): 6 via TOPICAL

## 2023-03-05 MED ORDER — SODIUM CHLORIDE 0.9% FLUSH: 10.0000 mL | INTRAVENOUS | Status: DC | PRN

## 2023-03-05 NOTE — Progress Notes (Signed)
 PROGRESS NOTE    Jordan Oconnell  FMW:[id number] DOB: Jul 24, 1940 DOA: 03/01/2023 PCP: Shepard Ade, MD   Brief Narrative: 83 year old with past medical history significant for PAF on Eliquis , aortic stenosis status post TAVR, tachybradycardia syndrome, status post pacemaker, CKD stage IV, urothelial cancer status post multiple TURP with urostomy in place, RCC status post left nephrectomy with recurrent urothelial disease to solitary right renal pelvis, hypothyroidism who was a direct admission for management of osteomyelitis of the fourth toe.  Patient directly admitted per Dr. Barton for toe amputation.    Assessment & Plan:   Principal Problem:   Osteomyelitis of fourth toe of right foot (HCC) Active Problems:   Urothelial cancer (HCC)   PAF (paroxysmal atrial fibrillation) (HCC)   CKD (chronic kidney disease) stage 4, GFR 15-29 ml/min (HCC)   S/P TAVR (transcatheter aortic valve replacement)   Chronic combined systolic and diastolic CHF (congestive heart failure) (HCC)   Hypothyroidism  1-Osteomyelitis of the Right fourth toe: -Presented as a direct admission per orthopedic -Underwent right fourth toe disarticulation at metatarsophalangeal joint -Ok to resume Eliquis  per ortho.  -Ortho recommend IV antibiotics due to extensive bone infection.  Patient will require minimum of 6 weeks.  ID following.  -Currently on ceftriaxone  and Cubicin .  -Plan to keep over the weekend to follow up culture results. No growth  -IR consulted for central line, tunneled, placed 2/07.  -WBAT operative extremity in postop shoe, maximum elevation   CKD  Stage IV: Recent creatinine baseline appears to be 3.2 -Creatinine increased to 3.7.  received fluids post op.  -Cr stable - -Metabolic acidosis, on  sodium bicarb tablets to 3 times daily. Renal function stable.   Urothelial cancer s/p urostomy and RCC s/p left nephrectomy with recurrent disease to solitary right kidney:  -Has multifocal  urothelial disease, follows with [facility] oncology and nephrology -On active surveillance after most recent treatment being radiation therapy in 12 months of pembrolizumab  -Continue with urostomy care  Paroxysmal atrial fibrillation/flutter Tachy-brady syndrome s/p PPM: -Continue Coreg   -Continue amiodarone   -Eliquis  resume 2/6.  Hypertension: Started Norvasc . Monitor BP Increase Norvasc  today.   Chronic combined systolic and diastolic CHF: Last EF improved to 50-55% (09/22/2022) compared to prior 30-35% (06/02/2022).  Appears euvolemic on admission.  Not on diuretics as an outpatient.  Continue Coreg .  -Monitor volume status on IV fluids -Stable.   Hypothyroidism: Continue Synthroid .   Severe aortic stenosis s/p TAVR 01/2021: Stable on last echo 09/22/2022.   Insomnia: Continue mirtazapine  and Xanax  0.25 mg nightly.   Thrombocytopenia; follow trend.  Chronic;; 87---75--65--69 B12 deficiency: Started intramuscular supplement Leukopenia; Resolved.   Estimated body mass index is 27.98 kg/m as calculated from the following:   Height as of this encounter: 6' 1 (1.854 m).   Weight as of this encounter: 96.2 kg.   DVT prophylaxis: SCD Code Status: Full code Family Communication: Care discussed with patient, son over phone 2/07 Disposition Plan:  Status is: Inpatient Remains inpatient appropriate because: management of osteomyelitis    Consultants:  Ortho  Procedures:    Antimicrobials:    Subjective: He is alert,not much pain,. His post operative shoe was deliver.  Think he will have BM soon.   Objective: Vitals:   03/05/23 0538 03/05/23 0928 03/05/23 1008 03/05/23 1220  BP: (!) 163/82 (!) 163/82 (!) 163/82 (!) 150/69  Pulse: 69   67  Resp: 18   18  Temp: 98.4 F (36.9 C)   97.7 F (36.5 C)  TempSrc:  SpO2: 98%   97%  Weight:      Height:        Intake/Output Summary (Last 24 hours) at 03/05/2023 1259 Last data filed at 03/05/2023 1009 Gross per  24 hour  Intake 199.87 ml  Output 3200 ml  Net -3000.13 ml   Filed Weights   03/02/23 1433 03/04/23 0500  Weight: 86.6 kg 96.2 kg    Examination:  General exam; NAD Respiratory system: CTA Cardiovascular system: S1, S 2 RRR Gastrointestinal system: Urostomy in placed, NT Extremities: Right foot mepilex dressing.    Data Reviewed: I have personally reviewed following labs and imaging studies  CBC: Recent Labs  Lab 03/01/23 2315 03/02/23 0544 03/03/23 0620 03/04/23 0807  WBC 3.5* 3.0* 4.6 4.1  HGB 11.8* 10.8* 11.0* 11.8*  HCT 38.1* 34.9* 37.1* 38.9*  MCV 104.7* 104.8* 106.6* 105.1*  PLT 70* 67* 65* 69*   Basic Metabolic Panel: Recent Labs  Lab 03/01/23 2315 03/02/23 0544 03/03/23 0620 03/04/23 0807  NA 140 136 137 139  K 4.6 4.7 4.7 4.9  CL 110 109 108 111  CO2 17* 20* 17* 20*  GLUCOSE 125* 110* 138* 103*  BUN 49* 53* 42* 46*  CREATININE 3.67* 3.72* 3.00* 2.73*  CALCIUM  9.1 8.7* 8.8* 9.4   GFR: Estimated Creatinine Clearance: 25.1 mL/min (A) (by C-G formula based on SCr of 2.73 mg/dL (H)). Liver Function Tests: Recent Labs  Lab 03/01/23 2315  AST 18  ALT 12  ALKPHOS 56  BILITOT 0.7  PROT 7.0  ALBUMIN  3.8   No results for input(s): LIPASE, AMYLASE in the last 168 hours. No results for input(s): AMMONIA in the last 168 hours. Coagulation Profile: No results for input(s): INR, PROTIME in the last 168 hours. Cardiac Enzymes: Recent Labs  Lab 03/04/23 0534  CKTOTAL 101   BNP (last 3 results) No results for input(s): PROBNP in the last 8760 hours. HbA1C: No results for input(s): HGBA1C in the last 72 hours. CBG: No results for input(s): GLUCAP in the last 168 hours. Lipid Profile: No results for input(s): CHOL, HDL, LDLCALC, TRIG, CHOLHDL, LDLDIRECT in the last 72 hours. Thyroid  Function Tests: No results for input(s): TSH, T4TOTAL, FREET4, T3FREE, THYROIDAB in the last 72 hours. Anemia Panel: Recent  Labs    03/03/23 0620  VITAMINB12 152*   Sepsis Labs: No results for input(s): PROCALCITON, LATICACIDVEN in the last 168 hours.  Recent Results (from the past 240 hours)  Surgical PCR screen     Status: None   Collection Time: 03/02/23 12:53 AM   Specimen: Nasal Mucosa; Nasal Swab  Result Value Ref Range Status   MRSA, PCR NEGATIVE NEGATIVE Final   Staphylococcus aureus NEGATIVE NEGATIVE Final    Comment: (NOTE) The Xpert SA Assay (FDA approved for NASAL specimens in patients 90 years of age and older), is one component of a comprehensive surveillance program. It is not intended to diagnose infection nor to guide or monitor treatment. Performed at [facility], [address], [place], [place] [address]   Aerobic/Anaerobic Culture w Gram Stain (surgical/deep wound)     Status: None (Preliminary result)   Collection Time: 03/02/23  4:37 PM   Specimen: Toe, Right; Amputation  Result Value Ref Range Status   Specimen Description   Final    TOE RIGHT 4TH TOE Performed at [facility], [address], [place], [place] [address]    Special Requests   Final    NONE Performed at [facility], [address] [place], [place] [address]    Gram Stain NO WBC SEEN NO ORGANISMS SEEN   Final   Culture   Final    NO GROWTH 3 DAYS NO ANAEROBES ISOLATED; CULTURE IN PROGRESS FOR 5 DAYS Performed at [facility], [address], [place], [place] [address]    Report Status PENDING  Incomplete         Radiology Studies: IR US  Guide Vasc Access Right Result Date: 03/04/2023 CLINICAL DATA:  Osteomyelitis, needs durable venous access for antibiotics EXAM: TUNNELED CENTRAL VENOUS CATHETER PLACEMENT WITH ULTRASOUND AND FLUOROSCOPIC GUIDANCE TECHNIQUE: The procedure, risks, benefits, and alternatives were explained to the patient. Questions regarding the procedure were encouraged and answered. The patient understands and  consents to the procedure. Patency of the right IJ vein was confirmed with ultrasound with image documentation. An appropriate skin site was determined. Region was prepped using maximum barrier technique including cap and mask, sterile gown, sterile gloves, large sterile sheet, and Chlorhexidine  as cutaneous antisepsis. The region was infiltrated locally with 1% lidocaine . Under real-time ultrasound guidance, the right IJ vein was accessed with a 21 gauge micropuncture needle; the needle tip within the vein was confirmed with ultrasound image documentation. 3F single lumen cuffed PowerLine tunneled from a right anterior chest wall approach to the dermatotomy site. Needle exchanged over the 018 guidewire for transitional dilator, through which the catheter which had been cut to 20 cm was advanced under intermittent fluoroscopy, positioned with its tip at the cavoatrial junction. Spot chest radiograph confirms good catheter position. No pneumothorax. Catheter was flushed per protocol. Catheter secured externally with O Prolene suture. The right IJ dermatotomy site was closed with Dermabond. COMPLICATIONS: COMPLICATIONS None immediate FLUOROSCOPY TIME:  Radiation Exposure Index (as provided by the fluoroscopic device): 2 mGy air Kerma COMPARISON:  None Available. IMPRESSION: 1. Technically successful placement of tunneled right IJ tunneled single-lumen power injectable catheter with ultrasound and fluoroscopic guidance. Ready for routine use. Electronically Signed   By: JONETTA Faes M.D.   On: 03/04/2023 17:01   IR Fluoro Guide CV Line Right Result Date: 03/04/2023 CLINICAL DATA:  Osteomyelitis, needs durable venous access for antibiotics EXAM: TUNNELED CENTRAL VENOUS CATHETER PLACEMENT WITH ULTRASOUND AND FLUOROSCOPIC GUIDANCE TECHNIQUE: The procedure, risks, benefits, and alternatives were explained to the patient. Questions regarding the procedure were encouraged and answered. The patient understands and consents to  the procedure. Patency of the right IJ vein was confirmed with ultrasound with image documentation. An appropriate skin site was determined. Region was prepped using maximum barrier technique including cap and mask, sterile gown, sterile gloves, large sterile sheet, and Chlorhexidine  as cutaneous antisepsis. The region was infiltrated locally with 1% lidocaine . Under real-time ultrasound guidance, the right IJ vein was accessed with a 21 gauge micropuncture needle; the needle tip within the vein was confirmed with ultrasound image documentation. 3F single lumen cuffed PowerLine tunneled from a right anterior chest wall approach to the dermatotomy site. Needle exchanged over the 018 guidewire for transitional dilator, through which the catheter which had been cut to 20 cm was advanced under intermittent fluoroscopy, positioned with its tip at the cavoatrial junction. Spot chest radiograph confirms good catheter position. No pneumothorax. Catheter was flushed per protocol. Catheter secured externally with O Prolene suture. The right IJ dermatotomy site was closed with Dermabond. COMPLICATIONS: COMPLICATIONS None immediate FLUOROSCOPY TIME:  Radiation Exposure Index (as provided by the fluoroscopic device): 2 mGy air Kerma COMPARISON:  None Available. IMPRESSION: 1. Technically successful  placement of tunneled right IJ tunneled single-lumen power injectable catheter with ultrasound and fluoroscopic guidance. Ready for routine use. Electronically Signed   By: JONETTA Faes M.D.   On: 03/04/2023 17:01         Scheduled Meds:  ALPRAZolam   0.25 mg Oral QHS   amiodarone   200 mg Oral Daily   amLODipine   10 mg Oral Daily   apixaban   2.5 mg Oral BID   carvedilol   12.5 mg Oral BID   cyanocobalamin   1,000 mcg Intramuscular Daily   hydrALAZINE   10 mg Oral Q8H   levothyroxine   75 mcg Oral Q0600   mirtazapine   7.5 mg Oral QHS   sodium bicarbonate   1,300 mg Oral TID   sodium chloride  flush  3 mL Intravenous Q12H    Continuous Infusions:  cefTRIAXone  (ROCEPHIN )  IV 2 g (03/05/23 0927)   DAPTOmycin  Stopped (03/03/23 1440)     LOS: 4 days    Time spent: 35 minutes    Tashera Montalvo A Hasna Stefanik, MD [facility]   If 7PM-7AM, please contact night-coverage [email or web address]  03/05/2023, 12:59 PM

## 2023-03-05 NOTE — Plan of Care (Signed)

## 2023-03-05 NOTE — Progress Notes (Signed)
 Physical Therapy Treatment Patient Details Name: Jordan Oconnell MRN: [id number] DOB: 08-14-1940 Today's Date: 03/05/2023   History of Present Illness 83 year old with past medical history significant for PAF on Eliquis , aortic stenosis status post TAVR, tachybradycardia syndrome, status post pacemaker, CKD stage IV, urothelial cancer status post multiple TURP with urostomy in place, RCC status post left nephrectomy with recurrent urothelial disease to solitary right renal pelvis, hypothyroidism who was a direct admission for management of osteomyelitis of the fourth toe.  Patient directly admitted per Dr. Barton for toe amputation    PT Comments  Pt received supine in bed receiving care from NT and RN, agreeble to be seen. Pt denies pain in R foot except during donning of sock and post-op shoe, admits he wore the shoe overnight and advised pt to desist and only wear during ambulation/transfers. Pt demonstrated bed mobility with HOB elevated and use of bed rails at supervision level, sat EOB for donning of post-op shoe. Required CGA with ~75% verbal cuing for sequencing and use of BUE on bed to power up. Ambulated with RW and CGA with post-op shoe for ~86ft, no overt LOB noted, good sequencing and weight through RLE. Pt in recliner at end of session with instructions to remain at least 2 hours. We will continue to follow acutely.    If plan is discharge home, recommend the following: A little help with walking and/or transfers;A little help with bathing/dressing/bathroom;Assistance with cooking/housework;Assist for transportation;Help with stairs or ramp for entrance   Can travel by private vehicle        Equipment Recommendations  Rolling walker (2 wheels)    Recommendations for Other Services       Precautions / Restrictions Precautions Precautions: Fall Precaution Comments: WBAT in post-op shoe while ambulating with RW Required Braces or Orthoses: Other Brace Other Brace: Post-op  shoe Restrictions Weight Bearing Restrictions Per Provider Order: Yes RLE Weight Bearing Per Provider Order: Weight bearing as tolerated Other Position/Activity Restrictions: R LE WB though heel only. Per ortho, will order a post op shoe     Mobility  Bed Mobility Overal bed mobility: Needs Assistance Bed Mobility: Supine to Sit     Supine to sit: Supervision     General bed mobility comments: For safety only, no physical assist required    Transfers Overall transfer level: Needs assistance Equipment used: Rolling walker (2 wheels) Transfers: Sit to/from Stand Sit to Stand: Contact guard assist           General transfer comment: pt impulsive and requires cues for safety    Ambulation/Gait Ambulation/Gait assistance: Contact guard assist Gait Distance (Feet): 90 Feet Assistive device: Rolling walker (2 wheels) Gait Pattern/deviations: Step-to pattern, Antalgic Gait velocity: decreased     General Gait Details: Cues for safety, WBAT through R post-op shoe, RW, CGA, no overt LOB, pt with good sequencing and utilizing step-to pattern   Stairs             Wheelchair Mobility     Tilt Bed    Modified Rankin (Stroke Patients Only)       Balance Overall balance assessment: Needs assistance Sitting-balance support: Feet supported Sitting balance-Leahy Scale: Good     Standing balance support: Bilateral upper extremity supported, During functional activity, Reliant on assistive device for balance Standing balance-Leahy Scale: Poor Standing balance comment: pt impulsive using RW, cues for safety  Cognition Arousal: Alert Behavior During Therapy: WFL for tasks assessed/performed Overall Cognitive Status: Within Functional Limits for tasks assessed                                 General Comments: slightly impusive and has a strong drive to be IND        Exercises      General Comments         Pertinent Vitals/Pain Pain Assessment Pain Assessment: 0-10 Pain Score: 5  Pain Location: R foot, great toe Pain Descriptors / Indicators: Aching, Dull, Throbbing Pain Intervention(s): Limited activity within patient's tolerance, Monitored during session, Repositioned    Home Living                          Prior Function            PT Goals (current goals can now be found in the care plan section) Acute Rehab PT Goals Patient Stated Goal: to get stronger and return to IND and walking program PT Goal Formulation: With patient Time For Goal Achievement: 03/18/23 Potential to Achieve Goals: Good Progress towards PT goals: Progressing toward goals    Frequency    Min 1X/week      PT Plan      Co-evaluation              AM-PAC PT 6 Clicks Mobility   Outcome Measure  Help needed turning from your back to your side while in a flat bed without using bedrails?: None Help needed moving from lying on your back to sitting on the side of a flat bed without using bedrails?: A Little Help needed moving to and from a bed to a chair (including a wheelchair)?: A Little Help needed standing up from a chair using your arms (e.g., wheelchair or bedside chair)?: A Little Help needed to walk in hospital room?: A Little Help needed climbing 3-5 steps with a railing? : A Lot 6 Click Score: 18    End of Session Equipment Utilized During Treatment: Gait belt Activity Tolerance: No increased pain;Patient tolerated treatment well Patient left: in chair;with call bell/phone within reach;with chair alarm set;with family/visitor present Nurse Communication: Mobility status;Weight bearing status PT Visit Diagnosis: Unsteadiness on feet (R26.81);Other abnormalities of gait and mobility (R26.89);Muscle weakness (generalized) (M62.81);Difficulty in walking, not elsewhere classified (R26.2);Pain Pain - Right/Left: Right Pain - part of body: Ankle and joints of foot     Time:  1346-1415 PT Time Calculation (min) (ACUTE ONLY): 29 min  Charges:    $Gait Training: 8-22 mins $Therapeutic Activity: 8-22 mins PT General Charges $$ ACUTE PT VISIT: 1 Visit                     Elsie Grieves, PT, DPT [facility] Office: [phone]   Elsie Grieves 03/05/2023, 3:48 PM

## 2023-03-06 ENCOUNTER — Inpatient Hospital Stay (HOSPITAL_COMMUNITY): Payer: Medicare PPO

## 2023-03-06 DIAGNOSIS — I200 Unstable angina: Secondary | ICD-10-CM

## 2023-03-06 DIAGNOSIS — N184 Chronic kidney disease, stage 4 (severe): Secondary | ICD-10-CM | POA: Diagnosis not present

## 2023-03-06 DIAGNOSIS — I480 Paroxysmal atrial fibrillation: Secondary | ICD-10-CM | POA: Diagnosis not present

## 2023-03-06 DIAGNOSIS — R0609 Other forms of dyspnea: Secondary | ICD-10-CM

## 2023-03-06 DIAGNOSIS — M869 Osteomyelitis, unspecified: Secondary | ICD-10-CM | POA: Diagnosis not present

## 2023-03-06 DIAGNOSIS — I5042 Chronic combined systolic (congestive) and diastolic (congestive) heart failure: Secondary | ICD-10-CM | POA: Diagnosis not present

## 2023-03-06 LAB — CBC
HCT: 34.5 % — ABNORMAL LOW (ref 39.0–52.0)
Hemoglobin: 10.7 g/dL — ABNORMAL LOW (ref 13.0–17.0)
MCH: 32.2 pg (ref 26.0–34.0)
MCHC: 31 g/dL (ref 30.0–36.0)
MCV: 103.9 fL — ABNORMAL HIGH (ref 80.0–100.0)
Platelets: 66 10*3/uL — ABNORMAL LOW (ref 150–400)
RBC: 3.32 MIL/uL — ABNORMAL LOW (ref 4.22–5.81)
RDW: 13.8 % (ref 11.5–15.5)
WBC: 4.8 10*3/uL (ref 4.0–10.5)
nRBC: 0 % (ref 0.0–0.2)

## 2023-03-06 LAB — TROPONIN I (HIGH SENSITIVITY)
Troponin I (High Sensitivity): 29 ng/L — ABNORMAL HIGH (ref <18)
Troponin I (High Sensitivity): 36 ng/L — ABNORMAL HIGH (ref <18)
Troponin I (High Sensitivity): 161 ng/L (ref <18)
Troponin I (High Sensitivity): 294 ng/L (ref <18)

## 2023-03-06 LAB — ECHOCARDIOGRAM COMPLETE
AR max vel: 1.42 cm2
AV Area VTI: 1.36 cm2
AV Area mean vel: 1.67 cm2
AV Mean grad: 10 mm[Hg]
AV Peak grad: 16.8 mm[Hg]
Ao pk vel: 2.05 m/s
Area-P 1/2: 2.26 cm2
Height: 73 in
S' Lateral: 2.7 cm
Weight: 3393.32 [oz_av]

## 2023-03-06 LAB — BASIC METABOLIC PANEL
Anion gap: 9 (ref 5–15)
BUN: 55 mg/dL — ABNORMAL HIGH (ref 8–23)
CO2: 21 mmol/L — ABNORMAL LOW (ref 22–32)
Calcium: 8.9 mg/dL (ref 8.9–10.3)
Chloride: 109 mmol/L (ref 98–111)
Creatinine, Ser: 2.77 mg/dL — ABNORMAL HIGH (ref 0.61–1.24)
GFR, Estimated: 22 mL/min — ABNORMAL LOW (ref >60)
Glucose, Bld: 118 mg/dL — ABNORMAL HIGH (ref 70–99)
Potassium: 4.5 mmol/L (ref 3.5–5.1)
Sodium: 139 mmol/L (ref 135–145)

## 2023-03-06 LAB — MAGNESIUM: Magnesium: 2.1 mg/dL (ref 1.7–2.4)

## 2023-03-06 LAB — BRAIN NATRIURETIC PEPTIDE: B Natriuretic Peptide: 151 pg/mL — ABNORMAL HIGH (ref 0.0–100.0)

## 2023-03-06 MED ORDER — LACTATED RINGERS IV SOLN
INTRAVENOUS | Status: AC
  Administered 2023-03-06: 50 mL/h via INTRAVENOUS

## 2023-03-06 MED ORDER — HEPARIN (PORCINE) 25000 UT/250ML-% IV SOLN
1600.0000 [IU]/h | INTRAVENOUS | Status: AC
  Administered 2023-03-06: 1350 [IU]/h via INTRAVENOUS
  Administered 2023-03-07: 1450 [IU]/h via INTRAVENOUS
  Administered 2023-03-08: 1600 [IU]/h via INTRAVENOUS
  Filled 2023-03-06 (×3): qty 250

## 2023-03-06 MED ORDER — ASPIRIN 81 MG PO TBEC
81.0000 mg | DELAYED_RELEASE_TABLET | Freq: Every day | ORAL | Status: DC
  Administered 2023-03-06 – 2023-03-07 (×2): 81 mg via ORAL
  Filled 2023-03-06 (×2): qty 1

## 2023-03-06 MED ORDER — NITROGLYCERIN 0.4 MG SL SUBL
0.4000 mg | SUBLINGUAL_TABLET | SUBLINGUAL | Status: DC | PRN
  Administered 2023-03-06 (×2): 0.4 mg via SUBLINGUAL
  Filled 2023-03-06 (×3): qty 1

## 2023-03-06 MED ORDER — ATORVASTATIN CALCIUM 40 MG PO TABS
40.0000 mg | ORAL_TABLET | Freq: Every day | ORAL | Status: DC
  Administered 2023-03-06 – 2023-03-07 (×2): 40 mg via ORAL
  Filled 2023-03-06 (×2): qty 1

## 2023-03-06 NOTE — Progress Notes (Signed)
 PT Cancellation Note  Patient Details Name: Jordan Oconnell MRN: [id number] DOB: [birth year]   Cancelled Treatment:    Reason Eval/Treat Not Completed: Medical issues which prohibited therapy Pt with chest pressure this morning and requiring nitroglycerin .  MD requests to hold therapy.   Tari CROME Payson 03/06/2023, 3:26 PM Tari KLEIN, DPT Physical Therapist Acute Rehabilitation Services Office: [phone]

## 2023-03-06 NOTE — Plan of Care (Signed)
  Problem: Nutrition: Goal: Adequate nutrition will be maintained Outcome: Progressing   Problem: Pain Managment: Goal: General experience of comfort will improve and/or be controlled Outcome: Progressing   Problem: Elimination: Goal: Will not experience complications related to urinary retention Outcome: Progressing

## 2023-03-06 NOTE — Progress Notes (Signed)
 Cardiology Consultation   Patient ID: [name] MRN: [id number]; DOB: October 01, 1940  Admit date: 03/01/2023 Date of Consult: 03/06/2023  PCP:  Shepard Ade, MD   [facility] Providers Cardiologist:  Jerel Balding, MD  Electrophysiologist:  OLE ONEIDA HOLTS, MD       Patient Profile:   Jordan Oconnell is a 83 y.o. male with a hx of aortic stenosis, status post TAVR, tachybradycardia syndrome, status post dual chamber pacemaker, paroxysmal atrial fibrillation, moderate aortic root dilatation, bladder cancer, renal cell carcinoma, CKD who is being seen 03/06/2023 for the evaluation of chest pain at the request of Dr Madelyne . SABRA  History of Present Illness:   Jordan Oconnell is an 83 year old gentleman with a history of TAVR, pacemaker for tachybradycardia syndrome, paroxysmal atrial fibrillation, aortic root dilatation, bladder and ureteral cancer  He was admitted several days ago for toe amputation.  He woke up this morning with chest pressure.  The pressure is centered in his chest.  There is no radiation.  It is associated with some sweats.  There is no pleuritic component to the chest pain.  He has received 1 sublingual nitroglycerin  and thinks that the pain is gradually subsiding.  Will give him another nitroglycerin .  He has known moderate coronary artery disease by heart catheterization October 06, 2020.  He had tight stenosis in his first and second diagonal vessels.  These vessels were fairly small and were not candidates for revascularization.  He had moderate disease in his LAD and circumflex.  His right coronary artery is small and nondominant.  He has CKD.  His creatinine this morning is 2.77. He has thrombocytopenia.  His platelet count this morning is 66. He is not having any bleeding  Will give ASA 81 mg now and daily as long as he has not GI bleeding . DC eliquis  Start heparin         Past Medical History:  Diagnosis Date   Bladder cancer  (HCC)    BPH (benign prostatic hyperplasia)    CKD (chronic kidney disease), stage IV (HCC)    Colon polyps    Diverticulosis    GERD (gastroesophageal reflux disease)    Hyperlipidemia    pt denies   Hypertension    PAF (paroxysmal atrial fibrillation) (HCC)    on Flecanide and diltiazem . 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 Wonda and Dr. Lucas   Severe aortic stenosis 10/05/2020    Past Surgical History:  Procedure Laterality Date   AMPUTATION TOE Right 03/02/2023   Procedure: AMPUTATION TOE, 4th;  Surgeon: Barton Drape, MD;  Location: [facility];  Service: Orthopedics;  Laterality: Right;   APPENDECTOMY     BLADDER SURGERY  2008   transurethral resection/resection of prostatic urethra   BOWEL RESECTION     CARDIOVERSION N/A 03/03/2022   Procedure: CARDIOVERSION;  Surgeon: Lonni Slain, MD;  Location: [facility]COPY;  Service: Cardiovascular;  Laterality: N/A;   CARDIOVERSION N/A 03/12/2022   Procedure: CARDIOVERSION;  Surgeon: Sheena Pugh, DO;  Location: [facility] ENDOSCOPY;  Service: Cardiovascular;  Laterality: N/A;   ESOPHAGOGASTRODUODENOSCOPY N/A 07/24/2013   Procedure: ESOPHAGOGASTRODUODENOSCOPY (EGD);  Surgeon: Norleen LOISE Kiang, MD;  Location: [facility];  Service: Endoscopy;  Laterality: N/A;   INTRAOPERATIVE TRANSTHORACIC ECHOCARDIOGRAM N/A 01/27/2021  Procedure: INTRAOPERATIVE TRANSTHORACIC ECHOCARDIOGRAM;  Surgeon: Wonda Sharper, MD;  Location: [facility] INVASIVE CV LAB;  Service: Open Heart Surgery;  Laterality: N/A;   IR FLUORO GUIDE CV LINE RIGHT  03/04/2023   IR US  GUIDE VASC ACCESS RIGHT  03/04/2023   laparoscopic surgery  2012   laser   (?)   NEPHRECTOMY  2007   partial, left   NEPHROSTOMY  2011   stent   NM MYOCAR PERF WALL MOTION  09/08/2010   Normal    PACEMAKER IMPLANT N/A 02/12/2021   Procedure: PACEMAKER IMPLANT;  Surgeon: Cindie Ole DASEN, MD;  Location: [facility] INVASIVE CV LAB;  Service: Cardiovascular;  Laterality: N/A;   RIGHT HEART CATH AND CORONARY ANGIOGRAPHY N/A 10/06/2020   Procedure: RIGHT HEART CATH AND CORONARY ANGIOGRAPHY;  Surgeon: Wonda Sharper, MD;  Location: [facility] INVASIVE CV LAB;  Service: Cardiovascular;  Laterality: N/A;   ROBOT ASSISTED LAPAROSCOPIC COMPLETE CYSTECT ILEAL CONDUIT     TRANSCATHETER AORTIC VALVE REPLACEMENT, TRANSFEMORAL Right 01/27/2021   Procedure: TRANSCATHETER AORTIC VALVE REPLACEMENT, TRANSFEMORAL;  Surgeon: Wonda Sharper, MD;  Location: [facility] INVASIVE CV LAB;  Service: Open Heart Surgery;  Laterality: Right;   US  ECHOCARDIOGRAPHY  09/08/2010   mild diastolic dysfunction,mild dilated LA,mild MR,AI,mildly dilated aortic root     Home Medications:  Prior to Admission medications   Medication Sig Start Date End Date Taking? Authorizing Provider  acetaminophen  (TYLENOL ) 500 MG tablet Take 1,000-1,500 mg by mouth 2 (two) times daily as needed for moderate pain (pain score 4-6).   Yes [provider]  ALPRAZolam  (XANAX ) 0.25 MG tablet Take 0.25 mg by mouth at bedtime.   Yes [provider]  amiodarone  (PACERONE ) 200 MG tablet Take 1 tablet (200 mg total) by mouth daily. 11/03/22  Yes Cindie Ole DASEN, MD  azithromycin  (ZITHROMAX ) 500 MG tablet TAKE 1 TABLET BY MOUTH ONCE DAILY 1  HOUR  BEFORE  DENTAL  PROCEDURES  AND  CLEANINGS 02/23/23  Yes Croitoru, Mihai, MD  carvedilol  (COREG ) 12.5 MG tablet Take 12.5 mg by mouth 2 (two) times daily.   Yes [provider]  cephALEXin (KEFLEX) 500 MG capsule Take 500 mg by mouth every 6 (six) hours. 02/25/23  Yes [provider]  ELIQUIS  2.5 MG TABS tablet Take 1 tablet (2.5 mg total) by mouth 2 (two) times daily. 02/28/23  Yes Croitoru, Mihai, MD  ferrous sulfate  325 (65 FE) MG EC tablet Take 1 tablet (325 mg total) by mouth in the morning and at  bedtime. 02/17/23  Yes Croitoru, Mihai, MD  levothyroxine  (SYNTHROID ) 75 MCG tablet Take 75 mcg by mouth daily before breakfast.   Yes [provider]  mirtazapine  (REMERON ) 15 MG tablet Take 7.5 mg by mouth at bedtime. 08/09/20  Yes [provider]  sodium bicarbonate  650 MG tablet Take 1,300 mg by mouth 2 (two) times daily. 09/01/20  Yes [provider]  sulfamethoxazole-trimethoprim (BACTRIM) 400-80 MG tablet Take 1 tablet by mouth 2 (two) times daily. Patient not taking: Reported on 03/02/2023    [provider]  hydrALAZINE  (APRESOLINE ) 50 MG tablet Take 50 mg by mouth 2 (two) times daily. Patient not taking: Reported on 03/01/2022  03/01/22  [provider]    Inpatient Medications: Scheduled Meds:  ALPRAZolam   0.25 mg Oral QHS   amiodarone   200 mg Oral Daily   amLODipine   10 mg Oral Daily   apixaban   2.5 mg Oral BID   carvedilol   12.5 mg Oral BID   Chlorhexidine  Gluconate Cloth  6 each Topical Daily   cyanocobalamin   1,000 mcg Intramuscular Daily   hydrALAZINE   10 mg Oral Q8H   levothyroxine   75 mcg Oral Q0600   mirtazapine   7.5 mg Oral QHS   sodium bicarbonate   1,300 mg Oral TID   sodium chloride  flush  3 mL Intravenous Q12H   Continuous Infusions:  cefTRIAXone  (ROCEPHIN )  IV 2 g (03/06/23 0921)   DAPTOmycin  700 mg (03/05/23 1348)   PRN Meds: acetaminophen  **OR** acetaminophen , bisacodyl , HYDROmorphone  (DILAUDID ) injection, nitroGLYCERIN , ondansetron  **OR** ondansetron  (ZOFRAN ) IV, oxyCODONE , senna-docusate, sodium chloride  flush  Allergies:    Allergies  Allergen Reactions   Amoxicillin Other (See Comments)    Acute interstitial nephritis  Other Reaction(s): Not available   Tamsulosin     Other Reaction(s): Not available   Doxycycline Hives    Other Reaction(s): Not available   Flomax [Tamsulosin Hcl] Other (See Comments)    Dizzy     Social History:   Social History   Socioeconomic History   Marital status: Divorced     Spouse name: Not on file   Number of children: 2   Years of education: Not on file   Highest education level: Not on file  Occupational History   Occupation: runner, broadcasting/film/video  Tobacco Use   Smoking status: Never   Smokeless tobacco: Never  Vaping Use   Vaping status: Never Used  Substance and Sexual Activity   Alcohol  use: Yes    Comment: occasionally   Drug use: No   Sexual activity: Not on file  Other Topics Concern   Not on file  Social History Narrative   Not on file   Social Drivers of Health   Financial Resource Strain: Not on file  Food Insecurity: No Food Insecurity (03/01/2023)   Hunger Vital Sign    Worried About Running Out of Food in the Last Year: Never true    Ran Out of Food in the Last Year: Never true  Transportation Needs: No Transportation Needs (03/01/2023)   PRAPARE - Administrator, Civil Service (Medical): No    Lack of Transportation (Non-Medical): No  Physical Activity: Not on file  Stress: Not on file  Social Connections: Unknown (03/01/2023)   Social Connection and Isolation Panel [NHANES]    Frequency of Communication with Friends and Family: More than three times a week    Frequency of Social Gatherings with Friends and Family: Not on file    Attends Religious Services: Not on file    Active Member of Clubs or Organizations: Not on file    Attends Banker Meetings: Not on file    Marital Status: Not on file  Intimate Partner Violence: Not At Risk (03/01/2023)   Humiliation, Afraid, Rape, and Kick questionnaire    Fear of Current or Ex-Partner: No    Emotionally Abused: No    Physically Abused: No    Sexually Abused: No    Family History:    Family History  Problem Relation Age of Onset   Ovarian cancer Mother    Colon cancer Mother    Colon cancer Father    Crohn's disease Son    Breast cancer Daughter    Colon cancer Brother      ROS:  Please see the history of present illness.   All other ROS reviewed and negative.      Physical Exam/Data:   Vitals:   03/05/23 1349 03/05/23 2017 03/06/23 0442 03/06/23 0904  BP: (!) 151/74 (!) 149/70 ROLLEN)  155/82 (!) 158/77  Pulse: 69 75 66 61  Resp: 19 17 18 16   Temp:  98.1 F (36.7 C) 98.1 F (36.7 C) 97.6 F (36.4 C)  TempSrc:      SpO2: 97% 97% 94% 98%  Weight:      Height:        Intake/Output Summary (Last 24 hours) at 03/06/2023 1008 Last data filed at 03/06/2023 0530 Gross per 24 hour  Intake 307.02 ml  Output 2350 ml  Net -2042.98 ml      03/04/2023    5:00 AM 03/02/2023    2:33 PM 10/06/2022   11:28 AM  Last 3 Weights  Weight (lbs) 212 lb 1.3 oz 191 lb 192 lb 12.8 oz  Weight (kg) 96.2 kg 86.637 kg 87.454 kg     Body mass index is 27.98 kg/m.  General:  chronically ill , elderly man,  mildly uncomfortable  HEENT: normal Neck: no JVD Vascular: No carotid bruits; Distal pulses 2+ bilaterally Cardiac:  normal S1, S2; RRR; soft systolic murmur  Lungs:  clear to auscultation bilaterally, no wheezing, rhonchi or rales  Abd: soft, nontender, no hepatomegaly  Ext: no edema Musculoskeletal:  No deformities, BUE and BLE strength normal and equal Skin: warm and dry  Neuro:  CNs 2-12 intact, no focal abnormalities noted Psych:  Normal affect   EKG:  The EKG was personally reviewed and demonstrates:  AV pacing . Telemetry:  Telemetry was personally reviewed and demonstrates:    Relevant CV Studies:   Laboratory Data:  High Sensitivity Troponin:  No results for input(s): TROPONINIHS in the last 720 hours.   Chemistry Recent Labs  Lab 03/03/23 0620 03/04/23 0807 03/06/23 0309  NA 137 139 139  K 4.7 4.9 4.5  CL 108 111 109  CO2 17* 20* 21*  GLUCOSE 138* 103* 118*  BUN 42* 46* 55*  CREATININE 3.00* 2.73* 2.77*  CALCIUM  8.8* 9.4 8.9  GFRNONAA 20* 22* 22*  ANIONGAP 12 8 9     Recent Labs  Lab 03/01/23 2315  PROT 7.0  ALBUMIN  3.8  AST 18  ALT 12  ALKPHOS 56  BILITOT 0.7   Lipids No results for input(s): CHOL, TRIG, HDL,  LABVLDL, LDLCALC, CHOLHDL in the last 168 hours.  Hematology Recent Labs  Lab 03/03/23 0620 03/04/23 0807 03/06/23 0309  WBC 4.6 4.1 4.8  RBC 3.48* 3.70* 3.32*  HGB 11.0* 11.8* 10.7*  HCT 37.1* 38.9* 34.5*  MCV 106.6* 105.1* 103.9*  MCH 31.6 31.9 32.2  MCHC 29.6* 30.3 31.0  RDW 14.5 14.1 13.8  PLT 65* 69* 66*   Thyroid  No results for input(s): TSH, FREET4 in the last 168 hours.  BNPNo results for input(s): BNP, PROBNP in the last 168 hours.  DDimer No results for input(s): DDIMER in the last 168 hours.   Radiology/Studies:  DG CHEST PORT 1 VIEW Result Date: 03/06/2023 CLINICAL DATA:  Dyspnea. EXAM: PORTABLE CHEST 1 VIEW COMPARISON:  03/14/2022 FINDINGS: The cardiopericardial silhouette is within normal limits for size. Status post TAVR. Left permanent pacemaker again noted. Right IJ central line tip overlies the region of the innominate vein confluence. The lungs are clear without focal pneumonia, edema, pneumothorax or pleural effusion. No acute bony abnormality. IMPRESSION: No active disease. Electronically Signed   By: Camellia Candle M.D.   On: 03/06/2023 09:46   IR US  Guide Vasc Access Right Result Date: 03/04/2023 CLINICAL DATA:  Osteomyelitis, needs durable venous access for antibiotics EXAM: TUNNELED CENTRAL VENOUS CATHETER PLACEMENT WITH ULTRASOUND  AND FLUOROSCOPIC GUIDANCE TECHNIQUE: The procedure, risks, benefits, and alternatives were explained to the patient. Questions regarding the procedure were encouraged and answered. The patient understands and consents to the procedure. Patency of the right IJ vein was confirmed with ultrasound with image documentation. An appropriate skin site was determined. Region was prepped using maximum barrier technique including cap and mask, sterile gown, sterile gloves, large sterile sheet, and Chlorhexidine  as cutaneous antisepsis. The region was infiltrated locally with 1% lidocaine . Under real-time ultrasound guidance, the right  IJ vein was accessed with a 21 gauge micropuncture needle; the needle tip within the vein was confirmed with ultrasound image documentation. 74F single lumen cuffed PowerLine tunneled from a right anterior chest wall approach to the dermatotomy site. Needle exchanged over the 018 guidewire for transitional dilator, through which the catheter which had been cut to 20 cm was advanced under intermittent fluoroscopy, positioned with its tip at the cavoatrial junction. Spot chest radiograph confirms good catheter position. No pneumothorax. Catheter was flushed per protocol. Catheter secured externally with O Prolene suture. The right IJ dermatotomy site was closed with Dermabond. COMPLICATIONS: COMPLICATIONS None immediate FLUOROSCOPY TIME:  Radiation Exposure Index (as provided by the fluoroscopic device): 2 mGy air Kerma COMPARISON:  None Available. IMPRESSION: 1. Technically successful placement of tunneled right IJ tunneled single-lumen power injectable catheter with ultrasound and fluoroscopic guidance. Ready for routine use. Electronically Signed   By: JONETTA Faes M.D.   On: 03/04/2023 17:01   IR Fluoro Guide CV Line Right Result Date: 03/04/2023 CLINICAL DATA:  Osteomyelitis, needs durable venous access for antibiotics EXAM: TUNNELED CENTRAL VENOUS CATHETER PLACEMENT WITH ULTRASOUND AND FLUOROSCOPIC GUIDANCE TECHNIQUE: The procedure, risks, benefits, and alternatives were explained to the patient. Questions regarding the procedure were encouraged and answered. The patient understands and consents to the procedure. Patency of the right IJ vein was confirmed with ultrasound with image documentation. An appropriate skin site was determined. Region was prepped using maximum barrier technique including cap and mask, sterile gown, sterile gloves, large sterile sheet, and Chlorhexidine  as cutaneous antisepsis. The region was infiltrated locally with 1% lidocaine . Under real-time ultrasound guidance, the right IJ vein was  accessed with a 21 gauge micropuncture needle; the needle tip within the vein was confirmed with ultrasound image documentation. 74F single lumen cuffed PowerLine tunneled from a right anterior chest wall approach to the dermatotomy site. Needle exchanged over the 018 guidewire for transitional dilator, through which the catheter which had been cut to 20 cm was advanced under intermittent fluoroscopy, positioned with its tip at the cavoatrial junction. Spot chest radiograph confirms good catheter position. No pneumothorax. Catheter was flushed per protocol. Catheter secured externally with O Prolene suture. The right IJ dermatotomy site was closed with Dermabond. COMPLICATIONS: COMPLICATIONS None immediate FLUOROSCOPY TIME:  Radiation Exposure Index (as provided by the fluoroscopic device): 2 mGy air Kerma COMPARISON:  None Available. IMPRESSION: 1. Technically successful placement of tunneled right IJ tunneled single-lumen power injectable catheter with ultrasound and fluoroscopic guidance. Ready for routine use. Electronically Signed   By: JONETTA Faes M.D.   On: 03/04/2023 17:01     Assessment and Plan:   Chest pain :   Jordan Oconnell has known CAD .  Cath in 2022 shows moderate CAD of his LAD and LCX.  He has several small diagonal vessels that have 90% stenosis.   His symptoms are worrisome for UAP.  Not pleuritic  His creatinine is 2.7. - he has known CKD  Troponin is 29  Will  continue to check troponin levels    CP is slightly better after a SL NTG Will check troponins. Start ASA 81, start heparin .  Will hold Eliquis  for now  Will move to step down to allow us  to add IV NTG if he needs it later.      Risk Assessment/Risk Scores:     TIMI Risk Score for Unstable Angina or Non-ST Elevation MI:   The patient's TIMI risk score is  , which indicates a  % risk of all cause mortality, new or recurrent myocardial infarction or need for urgent revascularization in the next 14 days.    CHA2DS2-VASc  Score = 4   This indicates a 4.8% annual risk of stroke. The patient's score is based upon: CHF History: 1 HTN History: 1 Diabetes History: 0 Stroke History: 0 Vascular Disease History: 0 Age Score: 2 Gender Score: 0        For questions or updates, please contact [facility] Please consult [email or web address] for contact info under    Signed, Aleene Passe, MD  03/06/2023 10:08 AM

## 2023-03-06 NOTE — Progress Notes (Signed)
 Called to update patient's son, Rico, that patient has transferred to [facility] due to changes this morning. Patient now resting comfortably.

## 2023-03-06 NOTE — Progress Notes (Deleted)
  Electrophysiology Office Note:   Date:  03/06/2023  ID:  Jordan Oconnell, DOB 1940-07-15, MRN [id number]  Primary Cardiologist: Thurmon Fair, MD Primary Heart Failure: None Electrophysiologist: Lanier Prude, MD   {Click to update primary MD,subspecialty MD or APP then REFRESH:1}    History of Present Illness:   Jordan Oconnell is a 83 y.o. male with h/o AF, LBBB, CHB s/p PPM, atypical atrial flutter, chronic combined systolic / diastolic HF, severe AS s/p TAVR seen today for routine electrophysiology followup.   Since last being seen in our clinic the patient reports doing ***.  he denies chest pain, palpitations, dyspnea, PND, orthopnea, nausea, vomiting, dizziness, syncope, edema, weight gain, or early satiety.   Review of systems complete and found to be negative unless listed in HPI.    EP Information / Studies Reviewed:    EKG is not ordered today. EKG from 03/06/23 reviewed which showed dual AV paced rhythm 60 bpm      PPM Interrogation-  reviewed in detail today,  See PACEART report.  Device History: Field seismologist PPM implanted 02/16/21 for Sinus Node Dysfunction  Studies:  ECHO 03/06/23 >     Arrhythmia / AAD Atypical AFL  Amiodarone     Risk Assessment/Calculations:    CHA2DS2-VASc Score = 4  {Confirm score is correct.  If not, click here to update score.  REFRESH note.  :1} This indicates a 4.8% annual risk of stroke. The patient's score is based upon: CHF History: 1 HTN History: 1 Diabetes History: 0 Stroke History: 0 Vascular Disease History: 0 Age Score: 2 Gender Score: 0   {This patient has a significant risk of stroke if diagnosed with atrial fibrillation.  Please consider VKA or DOAC agent for anticoagulation if the bleeding risk is acceptable.   You can also use the SmartPhrase .[email or web address] for documentation.   :[id number]}          Physical Exam:   VS:  There were no vitals taken for this visit.   Wt Readings from Last 3  Encounters:  03/04/23 212 lb 1.3 oz (96.2 kg)  10/06/22 192 lb 12.8 oz (87.5 kg)  06/14/22 178 lb 6.4 oz (80.9 kg)     GEN: Well nourished, well developed in no acute distress NECK: No JVD; No carotid bruits CARDIAC: {EPRHYTHM:[id number]}, no murmurs, rubs, gallops RESPIRATORY:  Clear to auscultation without rales, wheezing or rhonchi  ABDOMEN: Soft, non-tender, non-distended EXTREMITIES:  No edema; No deformity   ASSESSMENT AND PLAN:    SND, LBBB s/p [employer] PPM  -Normal PPM function -See Pace Art report -No changes today  Atypical AFL  High Risk Drug Monitoring: Amiodarone  -EKG with NSR ***  -continue amiodarone 200 mg daily  -OAC for stroke prophylaxis   Secondary Hypercoagulable State  -continue Eliquis 2.5 mg BID, dose reviewed and appropriate by age/Cr  -previously felt not to be a candidate for Watchman   CKD 4 -per primary    Disposition:   Follow up with Dr. Lalla Brothers {EPFOLLOW QM:[id number]}  Signed, Canary Brim, NP-C, AGACNP-BC [facility] - Electrophysiology  03/06/2023, 8:12 PM

## 2023-03-06 NOTE — Progress Notes (Addendum)
 PROGRESS NOTE    Jordan Oconnell  FMW:[id number] DOB: [birth year] DOA: 03/01/2023 PCP: Shepard Ade, MD   Brief Narrative: 83 year old with past medical history significant for PAF on Eliquis , aortic stenosis status post TAVR, tachybradycardia syndrome, status post pacemaker, CKD stage IV, urothelial cancer status post multiple TURP with urostomy in place, RCC status post left nephrectomy with recurrent urothelial disease to solitary right renal pelvis, hypothyroidism who was a direct admission for management of osteomyelitis of the fourth toe.  Patient directly admitted per Dr. Barton for toe amputation.    Assessment & Plan:   Principal Problem:   Osteomyelitis of fourth toe of right foot (HCC) Active Problems:   Urothelial cancer (HCC)   PAF (paroxysmal atrial fibrillation) (HCC)   CKD (chronic kidney disease) stage 4, GFR 15-29 ml/min (HCC)   S/P TAVR (transcatheter aortic valve replacement)   Chronic combined systolic and diastolic CHF (congestive heart failure) (HCC)   Hypothyroidism   Angina:  -patient develops chest pressure this morning, not feeling well.  Also last night had some numbness left 4 toe.  -EKG obtain. Was paced.  -Cycle troponin: mildly elevated 29 -Nitroglycerin   PRN, alleviated pain.  -Cardiology was consulted, Dr Calhoun recommend hold eliquis , start Heparin  Gtt. Aspirin . Monitor platelet count.  -Transfer to Step down unit.  -Check ECHO -Chest x ray no pulmonary edema.   -Osteomyelitis of the Right fourth toe: -Presented as a direct admission per orthopedic -Underwent right fourth toe disarticulation at metatarsophalangeal joint -Ok to resume Eliquis  per ortho.  -Ortho recommend IV antibiotics due to extensive bone infection.  Patient will require minimum of 6 weeks.  ID following.  -Currently on ceftriaxone  and Cubicin .  -Plan to keep over the weekend to follow up culture results. No growth  -IR consulted for central line, tunneled, placed  2/07.  -WBAT operative extremity in postop shoe, maximum elevation   CKD  Stage IV: Recent creatinine baseline appears to be 3.2 -Creatinine increased to 3.7.  received fluids post op.  -Cr stable - -Metabolic acidosis, on  sodium bicarb tablets to 3 times daily. Renal function stable.   Urothelial cancer s/p urostomy and RCC s/p left nephrectomy with recurrent disease to solitary right kidney:  -Has multifocal urothelial disease, follows with [facility] oncology and nephrology -On active surveillance after most recent treatment being radiation therapy in 12 months of pembrolizumab  -Continue with urostomy care  Paroxysmal atrial fibrillation/flutter Tachy-brady syndrome s/p PPM: -Continue Coreg   -Continue amiodarone   -Eliquis  resume 2/6.  Hypertension: Started Norvasc . Monitor BP Increase Norvasc  today.   Chronic combined systolic and diastolic CHF: Last EF improved to 50-55% (09/22/2022) compared to prior 30-35% (06/02/2022).  Appears euvolemic on admission.  Not on diuretics as an outpatient.  Continue Coreg .  -Monitor volume status on IV fluids -Stable.   Hypothyroidism: Continue Synthroid .   Severe aortic stenosis s/p TAVR 01/2021: Stable on last echo 09/22/2022.   Insomnia: Continue mirtazapine  and Xanax  0.25 mg nightly.   Thrombocytopenia; follow trend.  Chronic;; 87---75--65--69 B12 deficiency: Started intramuscular supplement Leukopenia; Resolved.  Report seeing spot vision, after nitroglycerin . Report numbness 4 toe.  Will check CT head.   Estimated body mass index is 27.98 kg/m as calculated from the following:   Height as of this encounter: 6' 1 (1.854 m).   Weight as of this encounter: 96.2 kg.   DVT prophylaxis: SCD Code Status: Full code Family Communication: Care discussed with patient, son over phone 2/07 Disposition Plan:  Status is: Inpatient Remains inpatient appropriate because:  management of osteomyelitis    Consultants:   Ortho  Procedures:    Antimicrobials:    Subjective: He is not feeling well, report chest pressure , started this morning. He had some chill, sweats.  Report some numbness on four toe.  Neuro exam is non focal-- he is alert, moves all 4 extremities.   Objective: Vitals:   03/05/23 1349 03/05/23 2017 03/06/23 0442 03/06/23 0904  BP: (!) 151/74 (!) 149/70 (!) 155/82 (!) 158/77  Pulse: 69 75 66 61  Resp: 19 17 18 16   Temp:  98.1 F (36.7 C) 98.1 F (36.7 C) 97.6 F (36.4 C)  TempSrc:      SpO2: 97% 97% 94% 98%  Weight:      Height:        Intake/Output Summary (Last 24 hours) at 03/06/2023 9075 Last data filed at 03/06/2023 0530 Gross per 24 hour  Intake 319.96 ml  Output 2350 ml  Net -2030.04 ml   Filed Weights   03/02/23 1433 03/04/23 0500  Weight: 86.6 kg 96.2 kg    Examination:  General exam; NAD Respiratory system: CTA Cardiovascular system: S 1, S 2  RRR Gastrointestinal system: Urostomy in place, NT Extremities: Right foot mepilex dressing.    Data Reviewed: I have personally reviewed following labs and imaging studies  CBC: Recent Labs  Lab 03/01/23 2315 03/02/23 0544 03/03/23 0620 03/04/23 0807 03/06/23 0309  WBC 3.5* 3.0* 4.6 4.1 4.8  HGB 11.8* 10.8* 11.0* 11.8* 10.7*  HCT 38.1* 34.9* 37.1* 38.9* 34.5*  MCV 104.7* 104.8* 106.6* 105.1* 103.9*  PLT 70* 67* 65* 69* 66*   Basic Metabolic Panel: Recent Labs  Lab 03/01/23 2315 03/02/23 0544 03/03/23 0620 03/04/23 0807 03/06/23 0309  NA 140 136 137 139 139  K 4.6 4.7 4.7 4.9 4.5  CL 110 109 108 111 109  CO2 17* 20* 17* 20* 21*  GLUCOSE 125* 110* 138* 103* 118*  BUN 49* 53* 42* 46* 55*  CREATININE 3.67* 3.72* 3.00* 2.73* 2.77*  CALCIUM  9.1 8.7* 8.8* 9.4 8.9   GFR: Estimated Creatinine Clearance: 24.7 mL/min (A) (by C-G formula based on SCr of 2.77 mg/dL (H)). Liver Function Tests: Recent Labs  Lab 03/01/23 2315  AST 18  ALT 12  ALKPHOS 56  BILITOT 0.7  PROT 7.0  ALBUMIN  3.8    No results for input(s): LIPASE, AMYLASE in the last 168 hours. No results for input(s): AMMONIA in the last 168 hours. Coagulation Profile: No results for input(s): INR, PROTIME in the last 168 hours. Cardiac Enzymes: Recent Labs  Lab 03/04/23 0534  CKTOTAL 101   BNP (last 3 results) No results for input(s): PROBNP in the last 8760 hours. HbA1C: No results for input(s): HGBA1C in the last 72 hours. CBG: No results for input(s): GLUCAP in the last 168 hours. Lipid Profile: No results for input(s): CHOL, HDL, LDLCALC, TRIG, CHOLHDL, LDLDIRECT in the last 72 hours. Thyroid  Function Tests: No results for input(s): TSH, T4TOTAL, FREET4, T3FREE, THYROIDAB in the last 72 hours. Anemia Panel: No results for input(s): VITAMINB12, FOLATE, FERRITIN, TIBC, IRON, RETICCTPCT in the last 72 hours.  Sepsis Labs: No results for input(s): PROCALCITON, LATICACIDVEN in the last 168 hours.  Recent Results (from the past 240 hours)  Surgical PCR screen     Status: None   Collection Time: 03/02/23 12:53 AM   Specimen: Nasal Mucosa; Nasal Swab  Result Value Ref Range Status   MRSA, PCR NEGATIVE NEGATIVE Final   Staphylococcus aureus NEGATIVE NEGATIVE Final  Comment: (NOTE) The Xpert SA Assay (FDA approved for NASAL specimens in patients 88 years of age and older), is one component of a comprehensive surveillance program. It is not intended to diagnose infection nor to guide or monitor treatment. Performed at [facility], [address] [place], [place] [address]   Aerobic/Anaerobic Culture w Gram Stain (surgical/deep wound)     Status: None (Preliminary result)   Collection Time: 03/02/23  4:37 PM   Specimen: Toe, Right; Amputation  Result Value Ref Range Status   Specimen Description   Final    TOE RIGHT 4TH TOE Performed at [facility], [address], [place], [place] [address]    Special  Requests   Final    NONE Performed at [facility], [address], [place], [place] [address]    Gram Stain NO WBC SEEN NO ORGANISMS SEEN   Final   Culture   Final    NO GROWTH 3 DAYS NO ANAEROBES ISOLATED; CULTURE IN PROGRESS FOR 5 DAYS Performed at [facility], [address] [place], [place] [address]    Report Status PENDING  Incomplete         Radiology Studies: IR US  Guide Vasc Access Right Result Date: 03/04/2023 CLINICAL DATA:  Osteomyelitis, needs durable venous access for antibiotics EXAM: TUNNELED CENTRAL VENOUS CATHETER PLACEMENT WITH ULTRASOUND AND FLUOROSCOPIC GUIDANCE TECHNIQUE: The procedure, risks, benefits, and alternatives were explained to the patient. Questions regarding the procedure were encouraged and answered. The patient understands and consents to the procedure. Patency of the right IJ vein was confirmed with ultrasound with image documentation. An appropriate skin site was determined. Region was prepped using maximum barrier technique including cap and mask, sterile gown, sterile gloves, large sterile sheet, and Chlorhexidine  as cutaneous antisepsis. The region was infiltrated locally with 1% lidocaine . Under real-time ultrasound guidance, the right IJ vein was accessed with a 21 gauge micropuncture needle; the needle tip within the vein was confirmed with ultrasound image documentation. 20F single lumen cuffed PowerLine tunneled from a right anterior chest wall approach to the dermatotomy site. Needle exchanged over the 018 guidewire for transitional dilator, through which the catheter which had been cut to 20 cm was advanced under intermittent fluoroscopy, positioned with its tip at the cavoatrial junction. Spot chest radiograph confirms good catheter position. No pneumothorax. Catheter was flushed per protocol. Catheter secured externally with O Prolene suture. The right IJ dermatotomy site was closed with Dermabond. COMPLICATIONS:  COMPLICATIONS None immediate FLUOROSCOPY TIME:  Radiation Exposure Index (as provided by the fluoroscopic device): 2 mGy air Kerma COMPARISON:  None Available. IMPRESSION: 1. Technically successful placement of tunneled right IJ tunneled single-lumen power injectable catheter with ultrasound and fluoroscopic guidance. Ready for routine use. Electronically Signed   By: JONETTA Faes M.D.   On: 03/04/2023 17:01   IR Fluoro Guide CV Line Right Result Date: 03/04/2023 CLINICAL DATA:  Osteomyelitis, needs durable venous access for antibiotics EXAM: TUNNELED CENTRAL VENOUS CATHETER PLACEMENT WITH ULTRASOUND AND FLUOROSCOPIC GUIDANCE TECHNIQUE: The procedure, risks, benefits, and alternatives were explained to the patient. Questions regarding the procedure were encouraged and answered. The patient understands and consents to the procedure. Patency of the right IJ vein was confirmed with ultrasound with image documentation. An appropriate skin site was determined. Region was prepped using maximum barrier technique including cap and mask, sterile gown, sterile gloves, large sterile sheet, and Chlorhexidine  as cutaneous antisepsis. The region was infiltrated locally with 1% lidocaine . Under real-time  ultrasound guidance, the right IJ vein was accessed with a 21 gauge micropuncture needle; the needle tip within the vein was confirmed with ultrasound image documentation. 52F single lumen cuffed PowerLine tunneled from a right anterior chest wall approach to the dermatotomy site. Needle exchanged over the 018 guidewire for transitional dilator, through which the catheter which had been cut to 20 cm was advanced under intermittent fluoroscopy, positioned with its tip at the cavoatrial junction. Spot chest radiograph confirms good catheter position. No pneumothorax. Catheter was flushed per protocol. Catheter secured externally with O Prolene suture. The right IJ dermatotomy site was closed with Dermabond. COMPLICATIONS:  COMPLICATIONS None immediate FLUOROSCOPY TIME:  Radiation Exposure Index (as provided by the fluoroscopic device): 2 mGy air Kerma COMPARISON:  None Available. IMPRESSION: 1. Technically successful placement of tunneled right IJ tunneled single-lumen power injectable catheter with ultrasound and fluoroscopic guidance. Ready for routine use. Electronically Signed   By: JONETTA Faes M.D.   On: 03/04/2023 17:01         Scheduled Meds:  ALPRAZolam   0.25 mg Oral QHS   amiodarone   200 mg Oral Daily   amLODipine   10 mg Oral Daily   apixaban   2.5 mg Oral BID   carvedilol   12.5 mg Oral BID   Chlorhexidine  Gluconate Cloth  6 each Topical Daily   cyanocobalamin   1,000 mcg Intramuscular Daily   hydrALAZINE   10 mg Oral Q8H   levothyroxine   75 mcg Oral Q0600   mirtazapine   7.5 mg Oral QHS   sodium bicarbonate   1,300 mg Oral TID   sodium chloride  flush  3 mL Intravenous Q12H   Continuous Infusions:  cefTRIAXone  (ROCEPHIN )  IV 2 g (03/06/23 0921)   DAPTOmycin  700 mg (03/05/23 1348)     LOS: 5 days    Time spent: 35 minutes    Jordan Matton A Brevyn Ring, MD [facility]   If 7PM-7AM, please contact night-coverage [email or web address]  03/06/2023, 9:24 AM

## 2023-03-06 NOTE — Progress Notes (Signed)
 PHARMACY - ANTICOAGULATION CONSULT NOTE  Pharmacy Consult for Heparin  Indication: chest pain/ACS  Allergies  Allergen Reactions   Amoxicillin Other (See Comments)    Acute interstitial nephritis  Other Reaction(s): Not available   Tamsulosin     Other Reaction(s): Not available   Doxycycline Hives    Other Reaction(s): Not available   Flomax [Tamsulosin Hcl] Other (See Comments)    Dizzy     Patient Measurements: Height: 6' 1 (185.4 cm) Weight: 96.2 kg (212 lb 1.3 oz) IBW/kg (Calculated) : 79.9 Heparin  Dosing Weight: 96.2 kg  Vital Signs: Temp: 98 F (36.7 C) (02/09 1025) Temp Source: Oral (02/09 1025) BP: 116/68 (02/09 1040) Pulse Rate: 61 (02/09 1040)  Labs: Recent Labs    03/04/23 0534 03/04/23 0807 03/06/23 0309 03/06/23 0939  HGB  --  11.8* 10.7*  --   HCT  --  38.9* 34.5*  --   PLT  --  69* 66*  --   CREATININE  --  2.73* 2.77*  --   CKTOTAL 101  --   --   --   TROPONINIHS  --   --   --  29*    Estimated Creatinine Clearance: 24.7 mL/min (A) (by C-G formula based on SCr of 2.77 mg/dL (H)).   Medical History: Past Medical History:  Diagnosis Date   Bladder cancer (HCC)    BPH (benign prostatic hyperplasia)    CKD (chronic kidney disease), stage IV (HCC)    Colon polyps    Diverticulosis    GERD (gastroesophageal reflux disease)    Hyperlipidemia    pt denies   Hypertension    PAF (paroxysmal atrial fibrillation) (HCC)    on Flecanide and diltiazem . 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 Wonda and Dr. Lucas   Severe aortic stenosis 10/05/2020    Assessment: AC/Heme: Eliquis  resumed for Afib, low dose for age/Scr - Pancytopenia d/t MDS - recommended Regalado use 50k for threshold to stop AC (re-eval if active bleeding or clotting) -  2/9: IV heparin  ordered for ACS  but already had Eliquis  this AM. Troponin slightly elevated 29. - Hgb 10.7 stable, Plts 66 also stable  Goal of Therapy:  aPTT 66-102 (after taking Eliquis ) Monitor platelets by anticoagulation protocol: Yes   Plan:  D/c Eliquis  - LD 2/9 AM 0907 Start IV heparin  at 2100 (time). Will not bolus with severe thrombocytopenia IV heparin  1350 units/hr Check aPTT 6 hrs after heparin  starts Daily HL, aPTT, and CBC   Marielena Harvell S. Casimir, PharmD, BCPS Clinical Staff Pharmacist Casimir Salines Stillinger 03/06/2023,10:47 AM

## 2023-03-07 ENCOUNTER — Other Ambulatory Visit: Payer: Self-pay

## 2023-03-07 ENCOUNTER — Inpatient Hospital Stay (HOSPITAL_COMMUNITY): Payer: Medicare PPO

## 2023-03-07 DIAGNOSIS — M869 Osteomyelitis, unspecified: Secondary | ICD-10-CM | POA: Diagnosis not present

## 2023-03-07 DIAGNOSIS — M86171 Other acute osteomyelitis, right ankle and foot: Secondary | ICD-10-CM | POA: Diagnosis not present

## 2023-03-07 LAB — CBC
HCT: 32.6 % — ABNORMAL LOW (ref 39.0–52.0)
Hemoglobin: 10.2 g/dL — ABNORMAL LOW (ref 13.0–17.0)
MCH: 32.1 pg (ref 26.0–34.0)
MCHC: 31.3 g/dL (ref 30.0–36.0)
MCV: 102.5 fL — ABNORMAL HIGH (ref 80.0–100.0)
Platelets: 64 10*3/uL — ABNORMAL LOW (ref 150–400)
RBC: 3.18 MIL/uL — ABNORMAL LOW (ref 4.22–5.81)
RDW: 13.3 % (ref 11.5–15.5)
WBC: 4 10*3/uL (ref 4.0–10.5)
nRBC: 0 % (ref 0.0–0.2)

## 2023-03-07 LAB — URIC ACID: Uric Acid, Serum: 9.7 mg/dL — ABNORMAL HIGH (ref 3.7–8.6)

## 2023-03-07 LAB — BASIC METABOLIC PANEL WITH GFR
Anion gap: 10 (ref 5–15)
BUN: 64 mg/dL — ABNORMAL HIGH (ref 8–23)
CO2: 19 mmol/L — ABNORMAL LOW (ref 22–32)
Calcium: 8.9 mg/dL (ref 8.9–10.3)
Chloride: 107 mmol/L (ref 98–111)
Creatinine, Ser: 2.58 mg/dL — ABNORMAL HIGH (ref 0.61–1.24)
GFR, Estimated: 24 mL/min — ABNORMAL LOW
Glucose, Bld: 156 mg/dL — ABNORMAL HIGH (ref 70–99)
Potassium: 4.2 mmol/L (ref 3.5–5.1)
Sodium: 136 mmol/L (ref 135–145)

## 2023-03-07 LAB — AEROBIC/ANAEROBIC CULTURE W GRAM STAIN (SURGICAL/DEEP WOUND)
Culture: NO GROWTH
Gram Stain: NONE SEEN

## 2023-03-07 LAB — APTT
aPTT: 65 s — ABNORMAL HIGH (ref 24–36)
aPTT: 58 s — ABNORMAL HIGH (ref 24–36)

## 2023-03-07 LAB — HEPARIN LEVEL (UNFRACTIONATED): Heparin Unfractionated: >1.1 [IU]/mL — ABNORMAL HIGH (ref 0.30–0.70)

## 2023-03-07 LAB — TROPONIN I (HIGH SENSITIVITY)
Troponin I (High Sensitivity): 425 ng/L (ref <18)
Troponin I (High Sensitivity): 428 ng/L (ref <18)

## 2023-03-07 MED ORDER — ELIQUIS 2.5 MG PO TABS: 2.5000 mg | ORAL_TABLET | Freq: Two times a day (BID) | ORAL | 5 refills | Status: DC

## 2023-03-07 MED ORDER — COLCHICINE 0.3 MG HALF TABLET
0.3000 mg | ORAL_TABLET | Freq: Once | ORAL | Status: AC
  Administered 2023-03-07: 0.3 mg via ORAL
  Filled 2023-03-07: qty 1

## 2023-03-07 MED ORDER — ALLOPURINOL 100 MG PO TABS
100.0000 mg | ORAL_TABLET | Freq: Every day | ORAL | Status: DC
  Administered 2023-03-07 – 2023-03-12 (×6): 100 mg via ORAL
  Filled 2023-03-07 (×6): qty 1

## 2023-03-07 MED ORDER — ISOSORBIDE MONONITRATE ER 30 MG PO TB24
30.0000 mg | ORAL_TABLET | Freq: Every day | ORAL | Status: DC
  Administered 2023-03-07 – 2023-03-12 (×6): 30 mg via ORAL
  Filled 2023-03-07 (×6): qty 1

## 2023-03-07 NOTE — Progress Notes (Signed)
 PT Cancellation Note  Patient Details Name: Jordan Oconnell MRN: [id number] DOB: 02/16/1940   Cancelled Treatment:    Reason Eval/Treat Not Completed: Medical issues which prohibited therapy Patient scheduled for cardiac cath soon. Abelina Hoes PT Acute Rehabilitation Services Office [phone] Weekend [phone]   Dareen Ebbing 03/07/2023, 11:18 AM

## 2023-03-07 NOTE — Plan of Care (Signed)
  Problem: Education: Goal: Knowledge of General Education information will improve Description: Including pain rating scale, medication(s)/side effects and non-pharmacologic comfort measures 03/07/2023 0428 by Tiney Fore, RN Outcome: Progressing 03/07/2023 0428 by Tiney Fore, RN Outcome: Progressing   Problem: Health Behavior/Discharge Planning: Goal: Ability to manage health-related needs will improve 03/07/2023 0428 by Tiney Fore, RN Outcome: Progressing 03/07/2023 0428 by Tiney Fore, RN Outcome: Progressing   Problem: Clinical Measurements: Goal: Ability to maintain clinical measurements within normal limits will improve 03/07/2023 0428 by Tiney Fore, RN Outcome: Progressing 03/07/2023 0428 by Tiney Fore, RN Outcome: Progressing Goal: Will remain free from infection 03/07/2023 0428 by Tiney Fore, RN Outcome: Progressing 03/07/2023 0428 by Tiney Fore, RN Outcome: Progressing Goal: Diagnostic test results will improve 03/07/2023 0428 by Tiney Fore, RN Outcome: Progressing 03/07/2023 0428 by Tiney Fore, RN Outcome: Progressing Goal: Respiratory complications will improve Outcome: Progressing Goal: Cardiovascular complication will be avoided Outcome: Progressing

## 2023-03-07 NOTE — Progress Notes (Addendum)
 OT Cancellation Note  Patient Details Name: Jordan Oconnell MRN: [id number] DOB: [birth year]   Cancelled Treatment:    Reason Eval/Treat Not Completed: Medical issues which prohibited therapy. Per RN, awaiting clearance from cardiology. OT will follow up as appropriate next available time  Alfred Ann 03/07/2023, 10:25 AM

## 2023-03-07 NOTE — Progress Notes (Signed)
 PHARMACY - ANTICOAGULATION CONSULT NOTE  Pharmacy Consult for Heparin  Indication: chest pain/ACS  Allergies  Allergen Reactions   Amoxicillin Other (See Comments)    Acute interstitial nephritis  Other Reaction(s): Not available   Tamsulosin     Other Reaction(s): Not available   Doxycycline Hives    Other Reaction(s): Not available   Flomax [Tamsulosin Hcl] Other (See Comments)    Dizzy     Patient Measurements: Height: 6\' 1"  (185.4 cm) Weight: 89.8 kg (197 lb 15.6 oz) IBW/kg (Calculated) : 79.9 Heparin  Dosing Weight: 89.8  Vital Signs: Temp: 97.5 F (36.4 C) (02/10 1600) Temp Source: Axillary (02/10 1600) BP: 130/61 (02/10 1600) Pulse Rate: 63 (02/10 1600)  Labs: Recent Labs    03/06/23 0309 03/06/23 0939 03/06/23 2030 03/07/23 0500 03/07/23 0942 03/07/23 0943 03/07/23 1602  HGB 10.7*  --   --  10.2*  --   --   --   HCT 34.5*  --   --  32.6*  --   --   --   PLT 66*  --   --  64*  --   --   --   APTT  --   --   --  65*  --   --  58*  HEPARINUNFRC  --   --   --  >1.10*  --   --   --   CREATININE 2.77*  --   --   --   --  2.58*  --   TROPONINIHS  --    < > 294* 425* 428*  --   --    < > = values in this interval not displayed.    Estimated Creatinine Clearance: 24.5 mL/min (A) (by C-G formula based on SCr of 2.58 mg/dL (H)).   Medical History: Past Medical History:  Diagnosis Date   Bladder cancer (HCC)    BPH (benign prostatic hyperplasia)    CKD (chronic kidney disease), stage IV (HCC)    Colon polyps    Diverticulosis    GERD (gastroesophageal reflux disease)    Hyperlipidemia    pt denies   Hypertension    PAF (paroxysmal atrial fibrillation) (HCC)    on Flecanide and diltiazem . 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 Arlester Ladd and Dr. Sherene Dilling    Severe aortic stenosis 10/05/2020    Assessment: 83 yo M admitted with ACS.   He's currently on Eliquis  for Afib, low dose for age/Scr.  Last dose 2/9 @9a .   - Pancytopenia d/t MDS - recommended Regalado use 50k for threshold to stop AC (re-eval if active bleeding or clotting) - 2/9: IV heparin  ordered for ACS  but already had Eliquis  this AM. Troponin slightly elevated 29. - Hgb 10.7 stable, Plts 66 also stable  03/07/2023: aPTT still SUBtherapeutic on current IV heparin  rate of 1450 units/hr and increase in rate this morning CBC- Hg/pltc low but stable No bleeding or infusion related concerns reported by RN  Goal of Therapy:  aPTT 66-102 (after taking Eliquis ) Monitor platelets by anticoagulation protocol: Yes   Plan:  Increase IV heparin  to 1600 units/hr Recheck aPTT 8 hrs after heparin  rate increased Daily HL, aPTT, and CBC   Luise Saint, PharmD, BCPS Secure Chat if ?s 03/07/2023 4:18 PM

## 2023-03-07 NOTE — Progress Notes (Signed)
 Progress Note  Patient Name: Jordan Oconnell Date of Encounter: 03/07/2023  Primary Cardiologist: Luana Rumple, MD   Subjective   Patient seen examined by his bedside.  He was sitting up in a chair when I arrived.  Reported chest pain yesterday.  Not today.  Labs reviewed troponin trending up.  Inpatient Medications    Scheduled Meds:  allopurinol   100 mg Oral Daily   ALPRAZolam   0.25 mg Oral QHS   amiodarone   200 mg Oral Daily   amLODipine   10 mg Oral Daily   aspirin  EC  81 mg Oral Daily   atorvastatin   40 mg Oral Daily   carvedilol   12.5 mg Oral BID   Chlorhexidine  Gluconate Cloth  6 each Topical Daily   levothyroxine   75 mcg Oral Q0600   mirtazapine   7.5 mg Oral QHS   sodium bicarbonate   1,300 mg Oral TID   sodium chloride  flush  3 mL Intravenous Q12H   Continuous Infusions:  cefTRIAXone  (ROCEPHIN )  IV 200 mL/hr at 03/07/23 1000   DAPTOmycin  700 mg (03/05/23 1348)   heparin  1,450 Units/hr (03/07/23 1000)   lactated ringers  Stopped (03/07/23 0600)   PRN Meds: acetaminophen  **OR** acetaminophen , bisacodyl , HYDROmorphone  (DILAUDID ) injection, nitroGLYCERIN , ondansetron  **OR** ondansetron  (ZOFRAN ) IV, oxyCODONE , senna-docusate, sodium chloride  flush   Vital Signs    Vitals:   03/07/23 0600 03/07/23 0700 03/07/23 0800 03/07/23 0900  BP: (!) 172/76 (!) 166/57  (!) 165/99  Pulse: 68 64  62  Resp: 20 16  12   Temp:   97.8 F (36.6 C)   TempSrc:   Oral   SpO2: 98% 100%  99%  Weight:      Height:        Intake/Output Summary (Last 24 hours) at 03/07/2023 1035 Last data filed at 03/07/2023 1000 Gross per 24 hour  Intake 1387.79 ml  Output 1800 ml  Net -412.21 ml   Filed Weights   03/02/23 1433 03/04/23 0500 03/07/23 0500  Weight: 86.6 kg 96.2 kg 89.8 kg    Telemetry    AV paced rhythm- Personally Reviewed  ECG    None today, yesterday AV paced- Personally Reviewed  Physical Exam    General: Comfortable, sitting up in a chair Head: Atraumatic,  normal size  Eyes: PEERLA, EOMI  Neck: Supple, normal JVD Cardiac: Normal S1, S2; RRR; no murmurs, rubs, or gallops Lungs: Clear to auscultation bilaterally Abd: Soft, nontender, no hepatomegaly  Ext: warm, no edema Musculoskeletal: No deformities, BUE and BLE strength normal and equal Skin: Warm and dry, no rashes   Neuro: Alert and oriented to person, place, time, and situation, CNII-XII grossly intact, no focal deficits  Psych: Normal mood and affect   Labs    Chemistry Recent Labs  Lab 03/01/23 2315 03/02/23 0544 03/04/23 0807 03/06/23 0309 03/07/23 0943  NA 140   < > 139 139 136  K 4.6   < > 4.9 4.5 4.2  CL 110   < > 111 109 107  CO2 17*   < > 20* 21* 19*  GLUCOSE 125*   < > 103* 118* 156*  BUN 49*   < > 46* 55* 64*  CREATININE 3.67*   < > 2.73* 2.77* 2.58*  CALCIUM  9.1   < > 9.4 8.9 8.9  PROT 7.0  --   --   --   --   ALBUMIN  3.8  --   --   --   --   AST 18  --   --   --   --  ALT 12  --   --   --   --   ALKPHOS 56  --   --   --   --   BILITOT 0.7  --   --   --   --   GFRNONAA 16*   < > 22* 22* 24*  ANIONGAP 13   < > 8 9 10    < > = values in this interval not displayed.     Hematology Recent Labs  Lab 03/04/23 0807 03/06/23 0309 03/07/23 0500  WBC 4.1 4.8 4.0  RBC 3.70* 3.32* 3.18*  HGB 11.8* 10.7* 10.2*  HCT 38.9* 34.5* 32.6*  MCV 105.1* 103.9* 102.5*  MCH 31.9 32.2 32.1  MCHC 30.3 31.0 31.3  RDW 14.1 13.8 13.3  PLT 69* 66* 64*    Cardiac EnzymesNo results for input(s): "TROPONINI" in the last 168 hours. No results for input(s): "TROPIPOC" in the last 168 hours.   BNP Recent Labs  Lab 03/06/23 0939  BNP 151.0*     DDimer No results for input(s): "DDIMER" in the last 168 hours.   Radiology    CT HEAD WO CONTRAST ( ) Result Date: 03/06/2023 CLINICAL DATA:  Altered mental status. Transient episode of abnormal vision. EXAM: CT HEAD WITHOUT CONTRAST TECHNIQUE: Contiguous axial images were obtained from the base of the skull through the vertex  without intravenous contrast. RADIATION DOSE REDUCTION: This exam was performed according to the departmental dose-optimization program which includes automated exposure control, adjustment of the mA and/or kV according to patient size and/or use of iterative reconstruction technique. COMPARISON:  CT head without contrast 03/26/2022. FINDINGS: Brain: No acute infarct, hemorrhage, or mass lesion is present. Mild atrophy and white matter changes are present bilaterally, stable. The ventricles are of normal size. No significant extraaxial fluid collection is present. Remote lacunar infarct is present in the posteroinferior right cerebellum. 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 The brainstem and cerebellum are otherwise within normal limits. Vascular: Atherosclerotic calcifications are present within the cavernous internal carotid arteries bilaterally and at the dural margin of the right vertebral artery. No hyperdense vessel is present. Skull: Calvarium is intact. No focal lytic or blastic lesions are present. No significant extracranial soft tissue lesion is present. Sinuses/Orbits: The paranasal sinuses and mastoid air cells are clear. The globes and orbits are within normal limits. IMPRESSION: 1. No acute intracranial abnormality or significant interval change. 2. Stable atrophy and white matter disease. This likely reflects the sequela of chronic microvascular ischemia. 3. Remote lacunar infarct of the posteroinferior right cerebellum. Electronically Signed   By: Audree Leas M.D.   On: 03/06/2023 16:46   ECHOCARDIOGRAM COMPLETE Result Date: 03/06/2023    ECHOCARDIOGRAM REPORT   Patient Name:   Jordan Oconnell Date of Exam: 03/06/2023 Medical Rec #:  [id number]         Height:       73.0 in Accession #:    [id number]        Weight:       212.1 lb Date of Birth:  09-20-1940         BSA:          2.206 m Patient Age:    82 years          BP:           136/67 mmHg Patient Gender: M                 HR:  60 bpm. Exam Location:  Inpatient Procedure: 2D Echo, Cardiac Doppler and Color Doppler Indications:    Dyspnea R06.00  History:        Patient has prior history of Echocardiogram examinations, most                 recent 09/22/2022. Aortic Valve Disease; Risk                 Factors:Hypertension.                 Aortic Valve: 29 mm Edwards Sapien prosthetic, stented (TAVR)                 valve is present in the aortic position. Procedure Date:                 01/27/2021.  Sonographer:    Astrid Blamer Referring Phys: [phone] BELKYS A REGALADO IMPRESSIONS  1. Poor Echo images.  2. Left ventricular ejection fraction, by estimation, is 45 to 50%. The left ventricle has mildly decreased function. Left ventricular endocardial border not optimally defined to evaluate regional wall motion. There is moderate left ventricular hypertrophy. Left ventricular diastolic parameters are consistent with Grade I diastolic dysfunction (impaired relaxation). Elevated left ventricular end-diastolic pressure.  3. Right ventricular systolic function is normal. The right ventricular size is normal. Tricuspid regurgitation signal is inadequate for assessing PA pressure.  4. Right atrial size was mild to moderately dilated.  5. The mitral valve is grossly normal. No evidence of mitral valve regurgitation. No evidence of mitral stenosis.  6. The aortic valve has been repaired/replaced. Aortic valve regurgitation is not visualized. No aortic stenosis is present. There is a 29 mm Edwards Sapien prosthetic (TAVR) valve present in the aortic position. Procedure Date: 01/27/2021.  7. Aortic dilatation noted. There is mild dilatation of the ascending aorta, measuring 41 mm.  8. The inferior vena cava is normal in size with greater than 50% respiratory variability, suggesting right atrial pressure of 3 mmHg. Comparison(s): Changes from prior study are noted. LVEF appears to be mildly reduced, conisder Limited Echo with contrast for accurate estimation of  LVEF. FINDINGS  Left Ventricle: Left ventricular ejection fraction, by estimation, is 45 to 50%. The left ventricle has mildly decreased function. Left ventricular endocardial border not optimally defined to evaluate regional wall motion. The left ventricular internal cavity size was normal in size. There is moderate left ventricular hypertrophy. Abnormal (paradoxical) septal motion, consistent with RV pacemaker. Left ventricular diastolic parameters are consistent with Grade I diastolic dysfunction (impaired relaxation). Elevated left ventricular end-diastolic pressure. Right Ventricle: The right ventricular size is normal. No increase in right ventricular wall thickness. Right ventricular systolic function is normal. Tricuspid regurgitation signal is inadequate for assessing PA pressure. Left Atrium: Left atrial size was normal in size. Right Atrium: Right atrial size was mild to moderately dilated. Pericardium: There is no evidence of pericardial effusion. Mitral Valve: The mitral valve is grossly normal. No evidence of mitral valve regurgitation. No evidence of mitral valve stenosis. Tricuspid Valve: The tricuspid valve is not well visualized. Tricuspid valve regurgitation is not demonstrated. No evidence of tricuspid stenosis. Aortic Valve: The aortic valve has been repaired/replaced. Aortic valve regurgitation is not visualized. No aortic stenosis is present. Aortic valve mean gradient measures 10.0 mmHg. Aortic valve peak gradient measures 16.8 mmHg. Aortic valve area, by VTI measures 1.36 cm. There is a 29 mm Edwards Sapien prosthetic, stented (TAVR) valve present in the aortic position. Procedure Date:  01/27/2021. Pulmonic Valve: The pulmonic valve was normal in structure. Pulmonic valve regurgitation is not visualized. No evidence of pulmonic stenosis. Aorta: Aortic dilatation noted. There is mild dilatation of the ascending aorta, measuring 41 mm. Venous: The inferior vena cava is normal in size with  greater than 50% respiratory variability, suggesting right atrial pressure of 3 mmHg. IAS/Shunts: No atrial level shunt detected by color flow Doppler.  LEFT VENTRICLE PLAX 2D LVIDd:         5.00 cm   Diastology LVIDs:         2.70 cm   LV e' medial:    3.37 cm/s LV PW:         1.40 cm   LV E/e' medial:  21.4 LV IVS:        1.50 cm   LV e' lateral:   3.37 cm/s LVOT diam:     2.00 cm   LV E/e' lateral: 21.4 LV SV:         62 LV SV Index:   28 LVOT Area:     3.14 cm  RIGHT VENTRICLE RV S prime:     13.70 cm/s TAPSE (M-mode): 3.5 cm LEFT ATRIUM             Index        RIGHT ATRIUM           Index LA Vol (A2C):   64.6 ml 29.28 ml/m  RA Area:     23.80 cm LA Vol (A4C):   67.4 ml 30.55 ml/m  RA Volume:   74.30 ml  33.68 ml/m LA Biplane Vol: 68.2 ml 30.92 ml/m  AORTIC VALVE AV Area (Vmax):    1.42 cm AV Area (Vmean):   1.67 cm AV Area (VTI):     1.36 cm AV Vmax:           205.00 cm/s AV Vmean:          151.000 cm/s AV VTI:            0.454 m AV Peak Grad:      16.8 mmHg AV Mean Grad:      10.0 mmHg LVOT Vmax:         92.70 cm/s LVOT Vmean:        80.500 cm/s LVOT VTI:          0.197 m LVOT/AV VTI ratio: 0.43 MITRAL VALVE MV Area (PHT): 2.26 cm     SHUNTS MV Decel Time: 335 msec     Systemic VTI:  0.20 m MV E velocity: 72.00 cm/s   Systemic Diam: 2.00 cm MV A velocity: 105.00 cm/s MV E/A ratio:  0.69 Vishnu Priya Mallipeddi Electronically signed by Lucetta Russel Mallipeddi Signature Date/Time: 03/06/2023/2:14:17 PM    Final    DG CHEST PORT 1 VIEW Result Date: 03/06/2023 CLINICAL DATA:  Dyspnea. EXAM: PORTABLE CHEST 1 VIEW COMPARISON:  03/14/2022 FINDINGS: The cardiopericardial silhouette is within normal limits for size. Status post TAVR. Left permanent pacemaker again noted. Right IJ central line tip overlies the region of the innominate vein confluence. The lungs are clear without focal pneumonia, edema, pneumothorax or pleural effusion. No acute bony abnormality. IMPRESSION: No active disease. Electronically  Signed   By: Donnal Fusi M.D.   On: 03/06/2023 09:46    Cardiac Studies   Reviewed echo from yesterday  Patient Profile     83 y.o. male coronary artery disease, paroxysmal atrial fibrillation, status post TAVR valve, chronic combined diastolic systolic heart  failure  Assessment & Plan    NSTEMI-chest pain has improved he is on medical therapy.  Continue the heparin  drip, aspirin  81 mg daily.  His echocardiogram which was done yesterday does not show any evidence of motion abnormalities but his clinical history and recent symptoms as well as troponin continue to trend upwards warrants further ischemic evaluation.  Heart catheterization will be the most appropriate at this time.  I have discussed with the patient. Informed Consent   Shared Decision Making/Informed Consent The risks [stroke (1 in 1000), death (1 in 1000), kidney failure [usually temporary] (1 in 500), bleeding (1 in 200), allergic reaction [possibly serious] (1 in 200)], benefits (diagnostic support and management of coronary artery disease) and alternatives of a cardiac catheterization were discussed in detail with Mr. Nickum and he is willing to proceed.  Known CKD seems like creatinine is closer to baseline we will continue to monitor.  Blood pressure is elevated currently on carvedilol  12.5 mg twice daily along with amlodipine  10 mg daily.  In the setting of his recent angina we will add Imdur  30 mg daily.  Will benefit from an ARB or Arni but we will post cath to have this added on.  Paroxysmal atrial fibrillation/further with history of tachybradycardia syndrome he is status post pacemaker implantation-he is currently AV paced.  Continue his carvedilol , amiodarone  as well as heparin  drip for now.  Holding Eliquis  due to NSTEMI and need for heparin  for anticoagulation.       For questions or updates, please contact [facility] Please consult [email or web address] for contact info under Cardiology/STEMI.       Signed, Giankarlo Leamer, DO  03/07/2023, 10:35 AM

## 2023-03-07 NOTE — Progress Notes (Signed)
 PHARMACY - ANTICOAGULATION CONSULT NOTE  Pharmacy Consult for Heparin  Indication: chest pain/ACS  Allergies  Allergen Reactions   Amoxicillin Other (See Comments)    Acute interstitial nephritis  Other Reaction(s): Not available   Tamsulosin     Other Reaction(s): Not available   Doxycycline Hives    Other Reaction(s): Not available   Flomax [Tamsulosin Hcl] Other (See Comments)    Dizzy     Patient Measurements: Height: 6\' 1"  (185.4 cm) Weight: 89.8 kg (197 lb 15.6 oz) IBW/kg (Calculated) : 79.9 Heparin  Dosing Weight: 96.2 kg  Vital Signs: Temp: 97.4 F (36.3 C) (02/10 0000) Temp Source: Axillary (02/10 0000) BP: 139/67 (02/10 0200) Pulse Rate: 61 (02/10 0200)  Labs: Recent Labs    03/04/23 0807 03/06/23 0309 03/06/23 0939 03/06/23 1222 03/06/23 1650 03/06/23 2030 03/07/23 0500  HGB 11.8* 10.7*  --   --   --   --  10.2*  HCT 38.9* 34.5*  --   --   --   --  32.6*  PLT 69* 66*  --   --   --   --  64*  APTT  --   --   --   --   --   --  65*  HEPARINUNFRC  --   --   --   --   --   --  >1.10*  CREATININE 2.73* 2.77*  --   --   --   --   --   TROPONINIHS  --   --    < > 36* 161* 294*  --    < > = values in this interval not displayed.    Estimated Creatinine Clearance: 22.8 mL/min (A) (by C-G formula based on SCr of 2.77 mg/dL (H)).   Medical History: Past Medical History:  Diagnosis Date   Bladder cancer (HCC)    BPH (benign prostatic hyperplasia)    CKD (chronic kidney disease), stage IV (HCC)    Colon polyps    Diverticulosis    GERD (gastroesophageal reflux disease)    Hyperlipidemia    pt denies   Hypertension    PAF (paroxysmal atrial fibrillation) (HCC)    on Flecanide and diltiazem . 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 Arlester Ladd and Dr.  Sherene Dilling   Severe aortic stenosis 10/05/2020    Assessment: 83 yo M admitted with ACS.   He's currently on Eliquis  for Afib, low dose for age/Scr.  Last dose 2/9 @9a .   - Pancytopenia d/t MDS - recommended Regalado use 50k for threshold to stop AC (re-eval if active bleeding or clotting) - 2/9: IV heparin  ordered for ACS  but already had Eliquis  this AM. Troponin slightly elevated 29. - Hgb 10.7 stable, Plts 66 also stable  03/07/2023: Aptt 65 sec- slightly low on IV heparin  1350 units/hr. CBC- Hg/pltc low but stable No bleeding or infusion related concerns reported by RN  Goal of Therapy:  aPTT 66-102 (after taking Eliquis ) Monitor platelets by anticoagulation protocol: Yes   Plan:  Increase IV heparin  slightly to 1450 units/hr Recheck aPTT 8 hrs after heparin  rate increased Daily HL, aPTT, and CBC  Arie Kurtz PharmD 03/07/2023,6:09 AM

## 2023-03-07 NOTE — Progress Notes (Signed)
 [facility]  Date of Admission:  03/01/2023     Principal Problem:   Osteomyelitis of fourth toe of right foot (HCC) Active Problems:   Urothelial cancer (HCC)   PAF (paroxysmal atrial fibrillation) (HCC)   CKD (chronic kidney disease) stage 4, GFR 15-29 ml/min (HCC)   S/P TAVR (transcatheter aortic valve replacement)   Chronic combined systolic and diastolic CHF (congestive heart failure) (HCC)   Hypothyroidism          Assessment: 83 year old male with hospice history as below including A-fib, aortic stenosis post TAVR, tracheal bradycardia syndrome status post PPM, CKD in the Renal cell carcinoma status post left nephrectomy, urothelial cancer status post urostomy deal of osteomyelitis involving his fourth toe. #Osteomyelitis right foot status post right fourth toe disarticulation at metatarsal phalangeal joint -Patient had been on Bactrim and Keflex prior to admission. - Underwent procedure with orthopedics as above.  Orthopedics noted due to extended bone infection advised 6-week course of antibiotics. - Cultures remain no growth s/p empirical treatment with IV antibiotics given antecedent PO therapy Recommendations: -Central line in place.  Plan to treat empirically with IV antibiotics given negative cultures, antecedent antibiotic course with Bactrim and Keflex - Plan on 6 weeks antibiotics from the OR on 03/02/2023 EOT 04/13/2023 -Follow-up with ID on 03/30/2023  # Left fourth toe gout - Given a dose of colchicine , started allopurinol .  Patient states redness at toe started after OR. -will get xray of left foot    OPAT ORDERS:  Diagnosis: Right foot osteo  Allergies  Allergen Reactions   Amoxicillin Other (See Comments)    Acute interstitial nephritis  Other Reaction(s): Not available   Tamsulosin     Other Reaction(s): Not available   Doxycycline Hives    Other Reaction(s): Not available   Flomax [Tamsulosin Hcl] Other (See  Comments)    Dizzy      Discharge antibiotics to be given via PICC line:  Per pharmacy protocol  Daptomycin  700 mg IV every 48 hours  + Ceftriaxone  2 gm IV every 24 hours     Duration: 6 weeks End Date: 04/13/23  [facility] Care Per Protocol with [employer] Use: Home health RN for IV administration and teaching, line care and labs.    Labs weekly while on IV antibiotics: _x_ CBC with differential __ BMP **TWICE WEEKLY ON VANCOMYCIN   _x_ CMP _x_ CRP _x_ ESR __ Vancomycin  trough TWICE WEEKLY _x_ CK  __ Please pull PIC at completion of IV antibiotics _x_ Please leave PIC in place until doctor has seen patient or been notified  Fax weekly labs to [phone]  Clinic Follow Up Appt: 3/5  @ [facility] with dr Carloyn Chi dam   Evaluation of this patient requires complex antimicrobial therapy evaluation and counseling + isolation needs for disease transmission risk assessment and mitigation   Microbiology:   Antibiotics: Daptomycin  and ceftriaxone  Cultures: Blood  Urine  Other 03/02/2023 no growth  SUBJECTIVE: Afebrile overnight. Resting in bed.  Interval: afebrile overnight. Wbc 4k.   Review of Systems: Review of Systems  All other systems reviewed and are negative.    Scheduled Meds:  allopurinol   100 mg Oral Daily   ALPRAZolam   0.25 mg Oral QHS   amiodarone   200 mg Oral Daily   amLODipine   10 mg Oral Daily   aspirin  EC  81 mg Oral Daily   atorvastatin   40 mg Oral Daily   carvedilol   12.5 mg Oral  BID   Chlorhexidine  Gluconate Cloth  6 each Topical Daily   isosorbide  mononitrate  30 mg Oral Daily   levothyroxine   75 mcg Oral Q0600   mirtazapine   7.5 mg Oral QHS   sodium bicarbonate   1,300 mg Oral TID   sodium chloride  flush  3 mL Intravenous Q12H   Continuous Infusions:  cefTRIAXone  (ROCEPHIN )  IV Stopped (03/07/23 1007)   DAPTOmycin  200 mL/hr at 03/07/23 1200   heparin  1,450 Units/hr (03/07/23 1200)   PRN Meds:.acetaminophen  **OR** acetaminophen , bisacodyl ,  HYDROmorphone  (DILAUDID ) injection, nitroGLYCERIN , ondansetron  **OR** ondansetron  (ZOFRAN ) IV, oxyCODONE , senna-docusate, sodium chloride  flush Allergies  Allergen Reactions   Amoxicillin Other (See Comments)    Acute interstitial nephritis  Other Reaction(s): Not available   Tamsulosin     Other Reaction(s): Not available   Doxycycline Hives    Other Reaction(s): Not available   Flomax [Tamsulosin Hcl] Other (See Comments)    Dizzy     OBJECTIVE: Vitals:   03/07/23 0800 03/07/23 0900 03/07/23 1200 03/07/23 1300  BP:  (!) 165/99 (!) 135/56 (!) 135/59  Pulse:  62 60 (!) 59  Resp:  12 17 18   Temp: 97.8 F (36.6 C)  98.1 F (36.7 C)   TempSrc: Oral  Oral   SpO2:  99% 94% 96%  Weight:      Height:       Body mass index is 26.12 kg/m.  Physical Exam Constitutional:      General: He is not in acute distress.    Appearance: He is normal weight. He is not toxic-appearing.  HENT:     Head: Normocephalic and atraumatic.     Right Ear: External ear normal.     Left Ear: External ear normal.     Nose: No congestion or rhinorrhea.     Mouth/Throat:     Mouth: Mucous membranes are moist.     Pharynx: Oropharynx is clear.  Eyes:     Extraocular Movements: Extraocular movements intact.     Conjunctiva/sclera: Conjunctivae normal.     Pupils: Pupils are equal, round, and reactive to light.  Cardiovascular:     Rate and Rhythm: Normal rate and regular rhythm.     Heart sounds: No murmur heard.    No friction rub. No gallop.  Pulmonary:     Effort: Pulmonary effort is normal.     Breath sounds: Normal breath sounds.  Abdominal:     General: Abdomen is flat. Bowel sounds are normal.     Palpations: Abdomen is soft.  Musculoskeletal:        General: No swelling.     Cervical back: Normal range of motion and neck supple.  Skin:    General: Skin is warm and dry.  Neurological:     General: No focal deficit present.     Mental Status: He is oriented to person, place, and  time.  Psychiatric:        Mood and Affect: Mood normal.       Lab Results Lab Results  Component Value Date   WBC 4.0 03/07/2023   HGB 10.2 (L) 03/07/2023   HCT 32.6 (L) 03/07/2023   MCV 102.5 (H) 03/07/2023   PLT 64 (L) 03/07/2023    Lab Results  Component Value Date   CREATININE 2.58 (H) 03/07/2023   BUN 64 (H) 03/07/2023   NA 136 03/07/2023   K 4.2 03/07/2023   CL 107 03/07/2023   CO2 19 (L) 03/07/2023    Lab Results  Component  Value Date   ALT 12 03/01/2023   AST 18 03/01/2023   ALKPHOS 56 03/01/2023   BILITOT 0.7 03/01/2023        Orlie Bjornstad, MD [facility] 03/07/2023, 2:37 PM   I have personally spent 52 minutes involved in face-to-face and non-face-to-face activities for this patient on the day of the visit. Professional time spent includes the following activities: Preparing to see the patient (review of tests), Obtaining and/or reviewing separately obtained history (admission/discharge record), Performing a medically appropriate examination and/or evaluation , Ordering medications/tests/procedures, referring and communicating with other health care professionals, Documenting clinical information in the EMR, Independently interpreting results (not separately reported), Communicating results to the patient/family/caregiver, Counseling and educating the patient/family/caregiver and Care coordination (not separately reported).

## 2023-03-07 NOTE — Progress Notes (Addendum)
 PHARMACY CONSULT NOTE FOR:  OUTPATIENT  PARENTERAL ANTIBIOTIC THERAPY (OPAT)  Indication: Osteomyelitis of right foot  Regimen: Daptomycin  700 mg IV every 48 hours  + Ceftriaxone  2 gm IV every 24 hours  End date: 04/13/23  IV antibiotic discharge orders are pended. To discharging provider:  please sign these orders via discharge navigator,  Select New Orders & click on the button choice - Manage This Unsigned Work.     Thank you for allowing pharmacy to be a part of this patient's care.  Denson Flake, PharmD, BCPS, BCIDP Infectious Diseases Clinical Pharmacist Phone: [phone] 03/07/2023, 10:46 AM

## 2023-03-07 NOTE — Progress Notes (Addendum)
 PROGRESS NOTE    Jordan Oconnell  WGN:[id number] DOB: [birth year] DOA: 03/01/2023 PCP: Suan Elm, MD   Brief Narrative: 83 year old with past medical history significant for PAF on Eliquis , aortic stenosis status post TAVR, tachybradycardia syndrome, status post pacemaker, CKD stage IV, urothelial cancer status post multiple TURP with urostomy in place, RCC status post left nephrectomy with recurrent urothelial disease to solitary right renal pelvis, hypothyroidism who was a direct admission for management of osteomyelitis of the fourth toe.  Patient directly admitted per Dr. Cherl Corner for toe amputation.    Assessment & Plan:   Principal Problem:   Osteomyelitis of fourth toe of right foot (HCC) Active Problems:   Urothelial cancer (HCC)   PAF (paroxysmal atrial fibrillation) (HCC)   CKD (chronic kidney disease) stage 4, GFR 15-29 ml/min (HCC)   S/P TAVR (transcatheter aortic valve replacement)   Chronic combined systolic and diastolic CHF (congestive heart failure) (HCC)   Hypothyroidism   Angina, NSTEMI:  -Patient develops chest pressure this morning, not feeling well.  Also last night had some numbness left 4 toe.  -EKG obtain. Was paced.  -Cycle troponin: mildly elevated 29 -Nitroglycerin   PRN, alleviated pain.  -Cardiology was consulted, Dr Floria Hurst recommend hold eliquis , start Heparin  Gtt. Aspirin . Monitor platelet count.  -Transfer to Step down unit.  -ECHO: Ejection fraction 45 to 50%, left ventricular has mildly decreased function.  Left ventricular endocardial border not optimally defined to evaluate for regional wall motion.  Diastolic grade 1 dysfunction.  Aortic valve has been repaired/replaced -Chest x ray no pulmonary edema.  Troponin peaked to 400.  Awaiting cardiology recommendation Chest pain free this morning.  Cardiology planning Cath, will defer to cardio discussion risk benefit of cath in setting of CKD.   -Osteomyelitis of the Right fourth  toe: -Presented as a direct admission per orthopedic -Underwent right fourth toe disarticulation at metatarsophalangeal joint -Ok to resume Eliquis  per ortho.  -Ortho recommend IV antibiotics due to extensive bone infection.  Patient will require minimum of 6 weeks.  ID following.  -Currently on ceftriaxone  and Cubicin .  -Plan to keep over the weekend to follow up culture results. No growth  -IR consulted for central line, tunneled, placed 2/07.  -WBAT operative extremity in postop shoe, maximum elevation   CKD  Stage IV: Recent creatinine baseline appears to be 3.2 -Creatinine increased to 3.7.  received fluids post op.  -Cr stable - -Metabolic acidosis, on  sodium bicarb tablets to 3 times daily. Renal function stable.   Urothelial cancer s/p urostomy and RCC s/p left nephrectomy with recurrent disease to solitary right kidney:  -Has multifocal urothelial disease, follows with [facility] oncology and nephrology -On active surveillance after most recent treatment being radiation therapy in 12 months of pembrolizumab  -Continue with urostomy care  Paroxysmal atrial fibrillation/flutter Tachy-brady syndrome s/p PPM: -Continue Coreg   -Continue amiodarone   -Heparin  gtt, for angina, Resume Eliquis  per cardiology   Hypertension: Started Norvasc . Monitor BP   Chronic combined systolic and diastolic CHF: Last EF improved to 50-55% (09/22/2022) compared to prior 30-35% (06/02/2022).  Appears euvolemic on admission.  Not on diuretics as an outpatient.  Continue Coreg .  -Monitor volume status on IV fluids -Stable.   Hypothyroidism: Continue Synthroid .   Severe aortic stenosis s/p TAVR 01/2021: Stable on last echo 09/22/2022.   Insomnia: Continue mirtazapine  and Xanax  0.25 mg nightly.   Thrombocytopenia; follow trend.  Chronic;; 87---75--65--69--66 B12 deficiency: Started intramuscular supplement Leukopenia; Resolved.  Report seeing spot vision, after nitroglycerin . CT no  acute  intracranial abnormalities.   Right foot pain, ; Uric acid elevated, probably gout. Will start Allopurinol . Will give Colchicine .   Estimated body mass index is 26.12 kg/m as calculated from the following:   Height as of this encounter: 6\' 1"  (1.854 m).   Weight as of this encounter: 89.8 kg.   DVT prophylaxis: SCD Code Status: Full code Family Communication: Care discussed with patient, son over phone 2/07 Disposition Plan:  Status is: Inpatient Remains inpatient appropriate because: management of osteomyelitis    Consultants:  Ortho  Procedures:    Antimicrobials:    Subjective: Feeling better today. Still having pain LFT foot.  He felt nub in his 4 toe no Numbness.  Denies chest pressure.   Objective: Vitals:   03/07/23 0000 03/07/23 0100 03/07/23 0200 03/07/23 0500  BP:  (!) 154/77 139/67   Pulse: 63 66 61   Resp: 10 (!) 21 18   Temp: (!) 97.4 F (36.3 C)     TempSrc: Axillary     SpO2: 98% 97% 95%   Weight:    89.8 kg  Height:        Intake/Output Summary (Last 24 hours) at 03/07/2023 0717 Last data filed at 03/07/2023 0600 Gross per 24 hour  Intake 1315.55 ml  Output 3200 ml  Net -1884.45 ml   Filed Weights   03/02/23 1433 03/04/23 0500 03/07/23 0500  Weight: 86.6 kg 96.2 kg 89.8 kg    Examination:  General exam; NAD Respiratory system: CTA Cardiovascular system: S 1, S 2 RRR Gastrointestinal system: Urostomy in place, NT Extremities: Right foot with dressing   Data Reviewed: I have personally reviewed following labs and imaging studies  CBC: Recent Labs  Lab 03/02/23 0544 03/03/23 0620 03/04/23 0807 03/06/23 0309 03/07/23 0500  WBC 3.0* 4.6 4.1 4.8 4.0  HGB 10.8* 11.0* 11.8* 10.7* 10.2*  HCT 34.9* 37.1* 38.9* 34.5* 32.6*  MCV 104.8* 106.6* 105.1* 103.9* 102.5*  PLT 67* 65* 69* 66* 64*   Basic Metabolic Panel: Recent Labs  Lab 03/01/23 2315 03/02/23 0544 03/03/23 0620 03/04/23 0807 03/06/23 0309 03/06/23 0939  NA 140 136  137 139 139  --   K 4.6 4.7 4.7 4.9 4.5  --   CL 110 109 108 111 109  --   CO2 17* 20* 17* 20* 21*  --   GLUCOSE 125* 110* 138* 103* 118*  --   BUN 49* 53* 42* 46* 55*  --   CREATININE 3.67* 3.72* 3.00* 2.73* 2.77*  --   CALCIUM  9.1 8.7* 8.8* 9.4 8.9  --   MG  --   --   --   --   --  2.1   GFR: Estimated Creatinine Clearance: 22.8 mL/min (A) (by C-G formula based on SCr of 2.77 mg/dL (H)). Liver Function Tests: Recent Labs  Lab 03/01/23 2315  AST 18  ALT 12  ALKPHOS 56  BILITOT 0.7  PROT 7.0  ALBUMIN  3.8   No results for input(s): "LIPASE", "AMYLASE" in the last 168 hours. No results for input(s): "AMMONIA" in the last 168 hours. Coagulation Profile: No results for input(s): "INR", "PROTIME" in the last 168 hours. Cardiac Enzymes: Recent Labs  Lab 03/04/23 0534  CKTOTAL 101   BNP (last 3 results) No results for input(s): "PROBNP" in the last 8760 hours. HbA1C: No results for input(s): "HGBA1C" in the last 72 hours. CBG: No results for input(s): "GLUCAP" in the last 168 hours. Lipid Profile: No results for input(s): "CHOL", "HDL", "LDLCALC", "  TRIG", "CHOLHDL", "LDLDIRECT" in the last 72 hours. Thyroid  Function Tests: No results for input(s): "TSH", "T4TOTAL", "FREET4", "T3FREE", "THYROIDAB" in the last 72 hours. Anemia Panel: No results for input(s): "VITAMINB12", "FOLATE", "FERRITIN", "TIBC", "IRON", "RETICCTPCT" in the last 72 hours.  Sepsis Labs: No results for input(s): "PROCALCITON", "LATICACIDVEN" in the last 168 hours.  Recent Results (from the past 240 hours)  Surgical PCR screen     Status: None   Collection Time: 03/02/23 12:53 AM   Specimen: Nasal Mucosa; Nasal Swab  Result Value Ref Range Status   MRSA, PCR NEGATIVE NEGATIVE Final   Staphylococcus aureus NEGATIVE NEGATIVE Final    Comment: (NOTE) The Xpert SA Assay (FDA approved for NASAL specimens in patients 48 years of age and older), is one component of a comprehensive surveillance program. It  is not intended to diagnose infection nor to guide or monitor treatment. Performed at [facility], [address] [place], [place] [address]   Aerobic/Anaerobic Culture w Gram Stain (surgical/deep wound)     Status: None (Preliminary result)   Collection Time: 03/02/23  4:37 PM   Specimen: Toe, Right; Amputation  Result Value Ref Range Status   Specimen Description   Final    TOE RIGHT 4TH TOE Performed at [facility], [address], [place], [place] [address]    Special Requests   Final    NONE Performed at [facility], [address] [place], [place] [address]    Gram Stain NO WBC SEEN NO ORGANISMS SEEN   Final   Culture   Final    NO GROWTH 4 DAYS NO ANAEROBES ISOLATED; CULTURE IN PROGRESS FOR 5 DAYS Performed at [facility], [address] [place], [place] [address]    Report Status PENDING  Incomplete         Radiology Studies: CT HEAD WO CONTRAST ( ) Result Date: 03/06/2023 CLINICAL DATA:  Altered mental status. Transient episode of abnormal vision. EXAM: CT HEAD WITHOUT CONTRAST TECHNIQUE: Contiguous axial images were obtained from the base of the skull through the vertex without intravenous contrast. RADIATION DOSE REDUCTION: This exam was performed according to the departmental dose-optimization program which includes automated exposure control, adjustment of the mA and/or kV according to patient size and/or use of iterative reconstruction technique. COMPARISON:  CT head without contrast 03/26/2022. FINDINGS: Brain: No acute infarct, hemorrhage, or mass lesion is present. Mild atrophy and white matter changes are present bilaterally, stable. The ventricles are of normal size. No significant extraaxial fluid collection is present. Remote lacunar infarct is present in the posteroinferior right cerebellum. 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 The brainstem and cerebellum are otherwise within normal limits.  Vascular: Atherosclerotic calcifications are present within the cavernous internal carotid arteries bilaterally and at the dural margin of the right vertebral artery. No hyperdense vessel is present. Skull: Calvarium is intact. No focal lytic or blastic lesions are present. No significant extracranial soft tissue lesion is present. Sinuses/Orbits: The paranasal sinuses and mastoid air cells are clear. The globes and orbits are within normal limits. IMPRESSION: 1. No acute intracranial abnormality or significant interval change. 2. Stable atrophy and white matter disease. This likely reflects the sequela of chronic microvascular ischemia. 3. Remote lacunar infarct of the posteroinferior right cerebellum. Electronically Signed   By: Audree Leas M.D.   On: 03/06/2023 16:46   ECHOCARDIOGRAM COMPLETE Result Date: 03/06/2023    ECHOCARDIOGRAM REPORT   Patient Name:   Deantae P Guess Date  of Exam: 03/06/2023 Medical Rec #:  [id number]         Height:       73.0 in Accession #:    [id number]        Weight:       212.1 lb Date of Birth:  08-06-1940         BSA:          2.206 m Patient Age:    82 years          BP:           136/67 mmHg Patient Gender: M                 HR:           60 bpm. Exam Location:  Inpatient Procedure: 2D Echo, Cardiac Doppler and Color Doppler Indications:    Dyspnea R06.00  History:        Patient has prior history of Echocardiogram examinations, most                 recent 09/22/2022. Aortic Valve Disease; Risk                 Factors:Hypertension.                 Aortic Valve: 29 mm Edwards Sapien prosthetic, stented (TAVR)                 valve is present in the aortic position. Procedure Date:                 01/27/2021.  Sonographer:    Astrid Blamer Referring Phys: [phone] Shaunda Tipping A Salem Lembke IMPRESSIONS  1. Poor Echo images.  2. Left ventricular ejection fraction, by estimation, is 45 to 50%. The left ventricle has mildly decreased function. Left ventricular endocardial border not  optimally defined to evaluate regional wall motion. There is moderate left ventricular hypertrophy. Left ventricular diastolic parameters are consistent with Grade I diastolic dysfunction (impaired relaxation). Elevated left ventricular end-diastolic pressure.  3. Right ventricular systolic function is normal. The right ventricular size is normal. Tricuspid regurgitation signal is inadequate for assessing PA pressure.  4. Right atrial size was mild to moderately dilated.  5. The mitral valve is grossly normal. No evidence of mitral valve regurgitation. No evidence of mitral stenosis.  6. The aortic valve has been repaired/replaced. Aortic valve regurgitation is not visualized. No aortic stenosis is present. There is a 29 mm Edwards Sapien prosthetic (TAVR) valve present in the aortic position. Procedure Date: 01/27/2021.  7. Aortic dilatation noted. There is mild dilatation of the ascending aorta, measuring 41 mm.  8. The inferior vena cava is normal in size with greater than 50% respiratory variability, suggesting right atrial pressure of 3 mmHg. Comparison(s): Changes from prior study are noted. LVEF appears to be mildly reduced, conisder Limited Echo with contrast for accurate estimation of LVEF. FINDINGS  Left Ventricle: Left ventricular ejection fraction, by estimation, is 45 to 50%. The left ventricle has mildly decreased function. Left ventricular endocardial border not optimally defined to evaluate regional wall motion. The left ventricular internal cavity size was normal in size. There is moderate left ventricular hypertrophy. Abnormal (paradoxical) septal motion, consistent with RV pacemaker. Left ventricular diastolic parameters are consistent with Grade I diastolic dysfunction (impaired relaxation). Elevated left ventricular end-diastolic pressure. Right Ventricle: The right ventricular size is normal. No increase in right ventricular wall thickness. Right ventricular systolic function is normal. Tricuspid  regurgitation signal  is inadequate for assessing PA pressure. Left Atrium: Left atrial size was normal in size. Right Atrium: Right atrial size was mild to moderately dilated. Pericardium: There is no evidence of pericardial effusion. Mitral Valve: The mitral valve is grossly normal. No evidence of mitral valve regurgitation. No evidence of mitral valve stenosis. Tricuspid Valve: The tricuspid valve is not well visualized. Tricuspid valve regurgitation is not demonstrated. No evidence of tricuspid stenosis. Aortic Valve: The aortic valve has been repaired/replaced. Aortic valve regurgitation is not visualized. No aortic stenosis is present. Aortic valve mean gradient measures 10.0 mmHg. Aortic valve peak gradient measures 16.8 mmHg. Aortic valve area, by VTI measures 1.36 cm. There is a 29 mm Edwards Sapien prosthetic, stented (TAVR) valve present in the aortic position. Procedure Date: 01/27/2021. Pulmonic Valve: The pulmonic valve was normal in structure. Pulmonic valve regurgitation is not visualized. No evidence of pulmonic stenosis. Aorta: Aortic dilatation noted. There is mild dilatation of the ascending aorta, measuring 41 mm. Venous: The inferior vena cava is normal in size with greater than 50% respiratory variability, suggesting right atrial pressure of 3 mmHg. IAS/Shunts: No atrial level shunt detected by color flow Doppler.  LEFT VENTRICLE PLAX 2D LVIDd:         5.00 cm   Diastology LVIDs:         2.70 cm   LV e' medial:    3.37 cm/s LV PW:         1.40 cm   LV E/e' medial:  21.4 LV IVS:        1.50 cm   LV e' lateral:   3.37 cm/s LVOT diam:     2.00 cm   LV E/e' lateral: 21.4 LV SV:         62 LV SV Index:   28 LVOT Area:     3.14 cm  RIGHT VENTRICLE RV S prime:     13.70 cm/s TAPSE (M-mode): 3.5 cm LEFT ATRIUM             Index        RIGHT ATRIUM           Index LA Vol (A2C):   64.6 ml 29.28 ml/m  RA Area:     23.80 cm LA Vol (A4C):   67.4 ml 30.55 ml/m  RA Volume:   74.30 ml  33.68 ml/m LA  Biplane Vol: 68.2 ml 30.92 ml/m  AORTIC VALVE AV Area (Vmax):    1.42 cm AV Area (Vmean):   1.67 cm AV Area (VTI):     1.36 cm AV Vmax:           205.00 cm/s AV Vmean:          151.000 cm/s AV VTI:            0.454 m AV Peak Grad:      16.8 mmHg AV Mean Grad:      10.0 mmHg LVOT Vmax:         92.70 cm/s LVOT Vmean:        80.500 cm/s LVOT VTI:          0.197 m LVOT/AV VTI ratio: 0.43 MITRAL VALVE MV Area (PHT): 2.26 cm     SHUNTS MV Decel Time: 335 msec     Systemic VTI:  0.20 m MV E velocity: 72.00 cm/s   Systemic Diam: 2.00 cm MV A velocity: 105.00 cm/s MV E/A ratio:  0.69 Vishnu Priya Mallipeddi Electronically signed by Lucetta Russel Mallipeddi Signature  Date/Time: 03/06/2023/2:14:17 PM    Final    DG CHEST PORT 1 VIEW Result Date: 03/06/2023 CLINICAL DATA:  Dyspnea. EXAM: PORTABLE CHEST 1 VIEW COMPARISON:  03/14/2022 FINDINGS: The cardiopericardial silhouette is within normal limits for size. Status post TAVR. Left permanent pacemaker again noted. Right IJ central line tip overlies the region of the innominate vein confluence. The lungs are clear without focal pneumonia, edema, pneumothorax or pleural effusion. No acute bony abnormality. IMPRESSION: No active disease. Electronically Signed   By: Donnal Fusi M.D.   On: 03/06/2023 09:46         Scheduled Meds:  allopurinol   100 mg Oral Daily   ALPRAZolam   0.25 mg Oral QHS   amiodarone   200 mg Oral Daily   amLODipine   10 mg Oral Daily   aspirin  EC  81 mg Oral Daily   atorvastatin   40 mg Oral Daily   carvedilol   12.5 mg Oral BID   Chlorhexidine  Gluconate Cloth  6 each Topical Daily   cyanocobalamin   1,000 mcg Intramuscular Daily   levothyroxine   75 mcg Oral Q0600   mirtazapine   7.5 mg Oral QHS   sodium bicarbonate   1,300 mg Oral TID   sodium chloride  flush  3 mL Intravenous Q12H   Continuous Infusions:  cefTRIAXone  (ROCEPHIN )  IV Stopped (03/06/23 0951)   DAPTOmycin  700 mg (03/05/23 1348)   heparin  1,450 Units/hr (03/07/23 0624)    lactated ringers  50 mL/hr at 03/07/23 0300     LOS: 6 days    Time spent: 35 minutes    Yadir Zentner A Clarissia Mckeen, MD [facility]   If 7PM-7AM, please contact night-coverage [email or web address]  03/07/2023, 7:17 AM

## 2023-03-08 ENCOUNTER — Encounter (HOSPITAL_COMMUNITY): Disposition: A | Discharge status: Home / Self Care | Payer: Self-pay | Attending: Internal Medicine

## 2023-03-08 ENCOUNTER — Ambulatory Visit: Payer: Medicare PPO | Admitting: Pulmonary Disease

## 2023-03-08 ENCOUNTER — Encounter: Payer: Medicare PPO | Admitting: Student

## 2023-03-08 DIAGNOSIS — I495 Sick sinus syndrome: Secondary | ICD-10-CM

## 2023-03-08 DIAGNOSIS — I10 Essential (primary) hypertension: Secondary | ICD-10-CM | POA: Diagnosis not present

## 2023-03-08 DIAGNOSIS — I214 Non-ST elevation (NSTEMI) myocardial infarction: Secondary | ICD-10-CM

## 2023-03-08 DIAGNOSIS — D6869 Other thrombophilia: Secondary | ICD-10-CM

## 2023-03-08 DIAGNOSIS — I480 Paroxysmal atrial fibrillation: Secondary | ICD-10-CM | POA: Diagnosis not present

## 2023-03-08 DIAGNOSIS — I447 Left bundle-branch block, unspecified: Secondary | ICD-10-CM

## 2023-03-08 DIAGNOSIS — Z79899 Other long term (current) drug therapy: Secondary | ICD-10-CM

## 2023-03-08 DIAGNOSIS — I484 Atypical atrial flutter: Secondary | ICD-10-CM

## 2023-03-08 DIAGNOSIS — M869 Osteomyelitis, unspecified: Secondary | ICD-10-CM | POA: Diagnosis not present

## 2023-03-08 DIAGNOSIS — Z950 Presence of cardiac pacemaker: Secondary | ICD-10-CM

## 2023-03-08 LAB — CBC
HCT: 30.5 % — ABNORMAL LOW (ref 39.0–52.0)
Hemoglobin: 9.3 g/dL — ABNORMAL LOW (ref 13.0–17.0)
MCH: 31.3 pg (ref 26.0–34.0)
MCHC: 30.5 g/dL (ref 30.0–36.0)
MCV: 102.7 fL — ABNORMAL HIGH (ref 80.0–100.0)
Platelets: 74 10*3/uL — ABNORMAL LOW (ref 150–400)
RBC: 2.97 MIL/uL — ABNORMAL LOW (ref 4.22–5.81)
RDW: 13.3 % (ref 11.5–15.5)
WBC: 3.7 10*3/uL — ABNORMAL LOW (ref 4.0–10.5)
nRBC: 0 % (ref 0.0–0.2)

## 2023-03-08 LAB — BASIC METABOLIC PANEL
Anion gap: 7 (ref 5–15)
BUN: 69 mg/dL — ABNORMAL HIGH (ref 8–23)
CO2: 20 mmol/L — ABNORMAL LOW (ref 22–32)
Calcium: 8.8 mg/dL — ABNORMAL LOW (ref 8.9–10.3)
Chloride: 108 mmol/L (ref 98–111)
Creatinine, Ser: 2.64 mg/dL — ABNORMAL HIGH (ref 0.61–1.24)
GFR, Estimated: 23 mL/min — ABNORMAL LOW (ref >60)
Glucose, Bld: 99 mg/dL (ref 70–99)
Potassium: 4.6 mmol/L (ref 3.5–5.1)
Sodium: 135 mmol/L (ref 135–145)

## 2023-03-08 LAB — LIPID PANEL
Cholesterol: 82 mg/dL (ref 0–200)
HDL: 34 mg/dL — ABNORMAL LOW (ref >40)
LDL Cholesterol: 34 mg/dL (ref 0–99)
Total CHOL/HDL Ratio: 2.4 {ratio}
Triglycerides: 69 mg/dL (ref <150)
VLDL: 14 mg/dL (ref 0–40)

## 2023-03-08 LAB — HEPARIN LEVEL (UNFRACTIONATED): Heparin Unfractionated: 1.05 [IU]/mL — ABNORMAL HIGH (ref 0.30–0.70)

## 2023-03-08 LAB — APTT
aPTT: 97 s — ABNORMAL HIGH (ref 24–36)
aPTT: 91 s — ABNORMAL HIGH (ref 24–36)

## 2023-03-08 SURGERY — LEFT HEART CATH AND CORONARY ANGIOGRAPHY
Anesthesia: LOCAL

## 2023-03-08 MED ORDER — POLYETHYLENE GLYCOL 3350 17 G PO PACK
17.0000 g | PACK | Freq: Every day | ORAL | Status: DC
  Administered 2023-03-08 – 2023-03-11 (×4): 17 g via ORAL
  Filled 2023-03-08 (×4): qty 1

## 2023-03-08 MED ORDER — ATORVASTATIN CALCIUM 10 MG PO TABS
10.0000 mg | ORAL_TABLET | Freq: Every day | ORAL | Status: DC
  Administered 2023-03-08 – 2023-03-11 (×4): 10 mg via ORAL
  Filled 2023-03-08 (×4): qty 1

## 2023-03-08 MED ORDER — SODIUM CHLORIDE 0.9 % IV SOLN: INTRAVENOUS | Status: AC

## 2023-03-08 MED ORDER — ASPIRIN 81 MG PO CHEW: 81.0000 mg | CHEWABLE_TABLET | ORAL | Status: AC

## 2023-03-08 MED ORDER — ASPIRIN 81 MG PO TBEC
81.0000 mg | DELAYED_RELEASE_TABLET | Freq: Every day | ORAL | Status: DC
  Administered 2023-03-09 – 2023-03-12 (×4): 81 mg via ORAL
  Filled 2023-03-08 (×4): qty 1

## 2023-03-08 MED ORDER — HYDRALAZINE HCL 25 MG PO TABS
25.0000 mg | ORAL_TABLET | Freq: Two times a day (BID) | ORAL | Status: DC
  Administered 2023-03-08 – 2023-03-12 (×9): 25 mg via ORAL
  Filled 2023-03-08 (×9): qty 1

## 2023-03-08 MED ORDER — ASPIRIN 81 MG PO CHEW: 81.0000 mg | CHEWABLE_TABLET | ORAL | Status: DC

## 2023-03-08 MED ORDER — APIXABAN 2.5 MG PO TABS
2.5000 mg | ORAL_TABLET | Freq: Two times a day (BID) | ORAL | Status: DC
  Administered 2023-03-08 – 2023-03-12 (×8): 2.5 mg via ORAL
  Filled 2023-03-08 (×8): qty 1

## 2023-03-08 MED ORDER — VITAMIN B-12 1000 MCG PO TABS
1000.0000 ug | ORAL_TABLET | Freq: Every day | ORAL | Status: DC
  Administered 2023-03-08 – 2023-03-12 (×5): 1000 ug via ORAL
  Filled 2023-03-08 (×5): qty 1

## 2023-03-08 NOTE — Progress Notes (Signed)
PHARMACY - ANTICOAGULATION CONSULT NOTE  Pharmacy Consult for Heparin Indication: chest pain/ACS  Allergies  Allergen Reactions   Amoxicillin Other (See Comments)    Acute interstitial nephritis  Other Reaction(s): Not available   Tamsulosin     Other Reaction(s): Not available   Doxycycline Hives    Other Reaction(s): Not available   Flomax [Tamsulosin Hcl] Other (See Comments)    Dizzy     Patient Measurements: Height: 6\' 1"  (185.4 cm) Weight: 89.8 kg (197 lb 15.6 oz) IBW/kg (Calculated) : 79.9 Heparin Dosing Weight: 89.8  Vital Signs: Temp: 98.2 F (36.8 C) (02/10 2345) Temp Source: Oral (02/10 2345) BP: 118/59 (02/11 0000) Pulse Rate: 61 (02/11 0000)  Labs: Recent Labs    03/06/23 0309 03/06/23 0939 03/06/23 2030 03/07/23 0500 03/07/23 0942 03/07/23 0943 03/07/23 1602 03/08/23 0059  HGB 10.7*  --   --  10.2*  --   --   --   --   HCT 34.5*  --   --  32.6*  --   --   --   --   PLT 66*  --   --  64*  --   --   --   --   APTT  --   --   --  65*  --   --  58* 97*  HEPARINUNFRC  --   --   --  >1.10*  --   --   --   --   CREATININE 2.77*  --   --   --   --  2.58*  --   --   TROPONINIHS  --    < > 294* 425* 428*  --   --   --    < > = values in this interval not displayed.    Estimated Creatinine Clearance: 24.5 mL/min (A) (by C-G formula based on SCr of 2.58 mg/dL (H)).   Medical History: Past Medical History:  Diagnosis Date   Bladder cancer (HCC)    BPH (benign prostatic hyperplasia)    CKD (chronic kidney disease), stage IV (HCC)    Colon polyps    Diverticulosis    GERD (gastroesophageal reflux disease)    Hyperlipidemia    pt denies   Hypertension    PAF (paroxysmal atrial fibrillation) (HCC)    on Flecanide and diltiazem. No OAC given recurrent hematuria   Presence of permanent cardiac pacemaker    Renal cell carcinoma (HCC) 2008   left   S/P TAVR (transcatheter aortic valve replacement) 01/27/2021   s/p TAVR with a 29 mm Edwards S3UR via  the TF approach by Dr. Excell Seltzer and Dr. Laneta Simmers   Severe aortic stenosis 10/05/2020    Assessment: 83 yo M admitted with ACS.   He's currently on Eliquis for Afib, low dose for age/Scr.  Last dose 2/9 @9a .   - Pancytopenia d/t MDS - recommended Regalado use 50k for threshold to stop AC (re-eval if active bleeding or clotting) - 2/9: IV heparin ordered for ACS  but already had Eliquis this AM. Troponin slightly elevated 29. - Hgb 10.7 stable, Plts 66 also stable  03/08/2023: aPTT 97 sec- now therapeutic on IV heparin 1600 units/hr  Lab drawn from port.  IV heparin infusing peripherally. CBC- Hg/pltc low but stable No bleeding or infusion related concerns reported by RN  Goal of Therapy:  aPTT 66-102 (after taking Eliquis) Monitor platelets by anticoagulation protocol: Yes   Plan:  Continue IV heparin 1600 units/hr Check confirmatory aPTT at 0900  Daily HL, aPTT, and CBC while on heparin  Junita Push, PharmD, BCPS 03/08/2023 1:28 AM

## 2023-03-08 NOTE — Progress Notes (Addendum)
PROGRESS NOTE    Jordan Oconnell  UJW:[id number] DOB: 10-11-1940 DOA: 03/01/2023 PCP: Geoffry Paradise, MD   Brief Narrative: 83 year old with past medical history significant for PAF on Eliquis, aortic stenosis status post TAVR, tachybradycardia syndrome, status post pacemaker, CKD stage IV, urothelial cancer status post multiple TURP with urostomy in place, RCC status post left nephrectomy with recurrent urothelial disease to solitary right renal pelvis, hypothyroidism who was a direct admission for management of osteomyelitis of the fourth toe.  Patient directly admitted per Dr. Odis Hollingshead for toe amputation.   Patient had NSTEMI, develops chest pressure, elevation troponin. Cardiology consulted, plan for medical management.   Assessment & Plan:   Principal Problem:   Osteomyelitis of fourth toe of right foot (HCC) Active Problems:   Urothelial cancer (HCC)   PAF (paroxysmal atrial fibrillation) (HCC)   CKD (chronic kidney disease) stage 4, GFR 15-29 ml/min (HCC)   S/P TAVR (transcatheter aortic valve replacement)   Chronic combined systolic and diastolic CHF (congestive heart failure) (HCC)   Hypothyroidism   Angina, NSTEMI:  -Patient develops chest pressure this morning, not feeling well.  Also last night had some numbness left 4 toe.  -EKG obtain. Was paced.  -Cycle troponin: mildly elevated 29 -Nitroglycerin  PRN, alleviated pain.  -Cardiology was consulted, Dr Melburn Popper recommend hold eliquis, start Heparin Gtt. Aspirin. Monitor platelet count.  -Transfer to Step down unit.  -ECHO: Ejection fraction 45 to 50%, left ventricular has mildly decreased function.  Left ventricular endocardial border not optimally defined to evaluate for regional wall motion.  Diastolic grade 1 dysfunction.  Aortic valve has been repaired/replaced -Chest x ray no pulmonary edema.  Troponin peaked to 400.   Chest pain free this morning.  Cardiology planning Cath, they will discussed with patient  risk benefit of procedure, due to patient CKD>   -Osteomyelitis of the Right fourth toe: -Presented as a direct admission per orthopedic -Underwent right fourth toe disarticulation at metatarsophalangeal joint -Ok to resume Eliquis per ortho.  -Ortho recommend IV antibiotics due to extensive bone infection.  Patient will require minimum of 6 weeks.  ID following.  -Currently on ceftriaxone and Cubicin.  -Plan to keep over the weekend to follow up culture results. No growth  -IR consulted for central line, tunneled, placed 2/07.  -WBAT operative extremity in postop shoe, maximum elevation   CKD  Stage IV: Recent creatinine baseline appears to be 3.2 -Creatinine increased to 3.7.  received fluids post op.  -Cr stable - -Metabolic acidosis, on  sodium bicarb tablets to 3 times daily. Renal function stable.   Urothelial cancer s/p urostomy and RCC s/p left nephrectomy with recurrent disease to solitary right kidney:  -Has multifocal urothelial disease, follows with [facility] oncology and nephrology -On active surveillance after most recent treatment being radiation therapy in 12 months of pembrolizumab -Continue with urostomy care  Paroxysmal atrial fibrillation/flutter Tachy-brady syndrome s/p PPM: -Continue Coreg  -Continue amiodarone  -On Heparin gtt, for angina, Resume Eliquis per cardiology   Hypertension: Started Norvasc. Monitor BP   Chronic combined systolic and diastolic CHF: Last EF improved to 50-55% (09/22/2022) compared to prior 30-35% (06/02/2022).  Appears euvolemic on admission.  Not on diuretics as an outpatient.  Continue Coreg.  -Monitor volume status on IV fluids -Stable.   Hypothyroidism: Continue Synthroid.   Severe aortic stenosis s/p TAVR 01/2021: Stable on last echo 09/22/2022.   Insomnia: Continue mirtazapine and Xanax 0.25 mg nightly.   Thrombocytopenia; follow trend.  Chronic;; 87---75--65--69--66 B12 deficiency:  Started intramuscular supplement,  transition to oral supplement.  Leukopenia; Resolved.  Report seeing spot vision, after nitroglycerin. CT no acute intracranial abnormalities.   Right foot pain, ; Uric acid elevated, probably gout. Will start Allopurinol. Received a dose of  Colchicine.   Estimated body mass index is 25.25 kg/m as calculated from the following:   Height as of this encounter: 6\' 1"  (1.854 m).   Weight as of this encounter: 86.8 kg.   DVT prophylaxis: SCD Code Status: Full code Family Communication: Care discussed with patient, son over phone 2/10 Disposition Plan:  Status is: Inpatient Remains inpatient appropriate because: management of osteomyelitis, Home when clear by cardiology     Consultants:  Ortho  Procedures:    Antimicrobials:    Subjective: He is feeling well, denies chest pressure. Awaiting to discussed with cardiology regarding Cath  Objective: Vitals:   03/08/23 0400 03/08/23 0415 03/08/23 0431 03/08/23 0600  BP: (!) 135/53   (!) 148/51  Pulse: 60   (!) 59  Resp: (!) 22   (!) 24  Temp:  98.2 F (36.8 C)    TempSrc:  Oral    SpO2: 93%   100%  Weight:   86.8 kg   Height:        Intake/Output Summary (Last 24 hours) at 03/08/2023 0710 Last data filed at 03/08/2023 0600 Gross per 24 hour  Intake 1279.36 ml  Output 2300 ml  Net -1020.64 ml   Filed Weights   03/04/23 0500 03/07/23 0500 03/08/23 0431  Weight: 96.2 kg 89.8 kg 86.8 kg    Examination:  General exam: NAD Respiratory system: CTA Cardiovascular system: S 1, S 2 RRR Gastrointestinal system: Urostomy in place, NT Extremities: Right foot with dressing   Data Reviewed: I have personally reviewed following labs and imaging studies  CBC: Recent Labs  Lab 03/02/23 0544 03/03/23 0620 03/04/23 0807 03/06/23 0309 03/07/23 0500  WBC 3.0* 4.6 4.1 4.8 4.0  HGB 10.8* 11.0* 11.8* 10.7* 10.2*  HCT 34.9* 37.1* 38.9* 34.5* 32.6*  MCV 104.8* 106.6* 105.1* 103.9* 102.5*  PLT 67* 65* 69* 66* 64*   Basic  Metabolic Panel: Recent Labs  Lab 03/03/23 0620 03/04/23 0807 03/06/23 0309 03/06/23 0939 03/07/23 0943 03/08/23 0628  NA 137 139 139  --  136 135  K 4.7 4.9 4.5  --  4.2 4.6  CL 108 111 109  --  107 108  CO2 17* 20* 21*  --  19* 20*  GLUCOSE 138* 103* 118*  --  156* 99  BUN 42* 46* 55*  --  64* 69*  CREATININE 3.00* 2.73* 2.77*  --  2.58* 2.64*  CALCIUM 8.8* 9.4 8.9  --  8.9 8.8*  MG  --   --   --  2.1  --   --    GFR: Estimated Creatinine Clearance: 24 mL/min (A) (by C-G formula based on SCr of 2.64 mg/dL (H)). Liver Function Tests: Recent Labs  Lab 03/01/23 2315  AST 18  ALT 12  ALKPHOS 56  BILITOT 0.7  PROT 7.0  ALBUMIN 3.8   No results for input(s): "LIPASE", "AMYLASE" in the last 168 hours. No results for input(s): "AMMONIA" in the last 168 hours. Coagulation Profile: No results for input(s): "INR", "PROTIME" in the last 168 hours. Cardiac Enzymes: Recent Labs  Lab 03/04/23 0534  CKTOTAL 101   BNP (last 3 results) No results for input(s): "PROBNP" in the last 8760 hours. HbA1C: No results for input(s): "HGBA1C" in the last 72  hours. CBG: No results for input(s): "GLUCAP" in the last 168 hours. Lipid Profile: Recent Labs    03/08/23 0427  CHOL 82  HDL 34*  LDLCALC 34  TRIG 69  CHOLHDL 2.4   Thyroid Function Tests: No results for input(s): "TSH", "T4TOTAL", "FREET4", "T3FREE", "THYROIDAB" in the last 72 hours. Anemia Panel: No results for input(s): "VITAMINB12", "FOLATE", "FERRITIN", "TIBC", "IRON", "RETICCTPCT" in the last 72 hours.  Sepsis Labs: No results for input(s): "PROCALCITON", "LATICACIDVEN" in the last 168 hours.  Recent Results (from the past 240 hours)  Surgical PCR screen     Status: None   Collection Time: 03/02/23 12:53 AM   Specimen: Nasal Mucosa; Nasal Swab  Result Value Ref Range Status   MRSA, PCR NEGATIVE NEGATIVE Final   Staphylococcus aureus NEGATIVE NEGATIVE Final    Comment: (NOTE) The Xpert SA Assay (FDA approved  for NASAL specimens in patients 11 years of age and older), is one component of a comprehensive surveillance program. It is not intended to diagnose infection nor to guide or monitor treatment. Performed at [facility], [address], [place], [place] [address]   Aerobic/Anaerobic Culture w Gram Stain (surgical/deep wound)     Status: None   Collection Time: 03/02/23  4:37 PM   Specimen: Toe, Right; Amputation  Result Value Ref Range Status   Specimen Description   Final    TOE RIGHT 4TH TOE Performed at [facility], [address], [place], [place] [address]    Special Requests   Final    NONE Performed at [facility], [address] [place], [place] [address]    Gram Stain NO WBC SEEN NO ORGANISMS SEEN   Final   Culture   Final    No growth aerobically or anaerobically. Performed at [facility], [address] [place], [place] [address]    Report Status 03/07/2023 FINAL  Final         Radiology Studies: DG Foot Complete Left Result Date: 03/07/2023 CLINICAL DATA:  Foot infection EXAM: LEFT FOOT - COMPLETE 3+ VIEW COMPARISON:  None Available. FINDINGS: Vascular calcifications. No fracture or malalignment. Small plantar calcaneal spur. Moderate degenerative change at the first MTP joint. IP joint space narrowing. No osseous destructive change or soft tissue emphysema IMPRESSION: No acute osseous abnormality. Degenerative changes. Electronically Signed   By: Jasmine Pang M.D.   On: 03/07/2023 20:57   CT HEAD WO CONTRAST ( ) Result Date: 03/06/2023 CLINICAL DATA:  Altered mental status. Transient episode of abnormal vision. EXAM: CT HEAD WITHOUT CONTRAST TECHNIQUE: Contiguous axial images were obtained from the base of the skull through the vertex without intravenous contrast. RADIATION DOSE REDUCTION: This exam was performed according to the departmental dose-optimization program which includes automated  exposure control, adjustment of the mA and/or kV according to patient size and/or use of iterative reconstruction technique. COMPARISON:  CT head without contrast 03/26/2022. FINDINGS: Brain: No acute infarct, hemorrhage, or mass lesion is present. Mild atrophy and white matter changes are present bilaterally, stable. The ventricles are of normal size. No significant extraaxial fluid collection is present. Remote lacunar infarct is present in the posteroinferior right cerebellum. 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 The brainstem and cerebellum are otherwise within normal limits. Vascular: Atherosclerotic calcifications are present within the cavernous internal carotid arteries bilaterally and at the dural margin of the right vertebral artery. No hyperdense vessel is present. Skull: Calvarium is intact. No focal lytic or blastic lesions are  present. No significant extracranial soft tissue lesion is present. Sinuses/Orbits: The paranasal sinuses and mastoid air cells are clear. The globes and orbits are within normal limits. IMPRESSION: 1. No acute intracranial abnormality or significant interval change. 2. Stable atrophy and white matter disease. This likely reflects the sequela of chronic microvascular ischemia. 3. Remote lacunar infarct of the posteroinferior right cerebellum. Electronically Signed   By: Marin Roberts M.D.   On: 03/06/2023 16:46   ECHOCARDIOGRAM COMPLETE Result Date: 03/06/2023    ECHOCARDIOGRAM REPORT   Patient Name:   Jessejames AVEER BARTOW Date of Exam: 03/06/2023 Medical Rec #:  [id number]         Height:       73.0 in Accession #:    [id number]        Weight:       212.1 lb Date of Birth:  1940-02-06         BSA:          2.206 m Patient Age:    82 years          BP:           136/67 mmHg Patient Gender: M                 HR:           60 bpm. Exam Location:  Inpatient Procedure: 2D Echo, Cardiac Doppler and Color Doppler Indications:    Dyspnea R06.00  History:        Patient has prior  history of Echocardiogram examinations, most                 recent 09/22/2022. Aortic Valve Disease; Risk                 Factors:Hypertension.                 Aortic Valve: 29 mm Edwards Sapien prosthetic, stented (TAVR)                 valve is present in the aortic position. Procedure Date:                 01/27/2021.  Sonographer:    Darlys Gales Referring Phys: [phone] Tondra Reierson A Martin Belling IMPRESSIONS  1. Poor Echo images.  2. Left ventricular ejection fraction, by estimation, is 45 to 50%. The left ventricle has mildly decreased function. Left ventricular endocardial border not optimally defined to evaluate regional wall motion. There is moderate left ventricular hypertrophy. Left ventricular diastolic parameters are consistent with Grade I diastolic dysfunction (impaired relaxation). Elevated left ventricular end-diastolic pressure.  3. Right ventricular systolic function is normal. The right ventricular size is normal. Tricuspid regurgitation signal is inadequate for assessing PA pressure.  4. Right atrial size was mild to moderately dilated.  5. The mitral valve is grossly normal. No evidence of mitral valve regurgitation. No evidence of mitral stenosis.  6. The aortic valve has been repaired/replaced. Aortic valve regurgitation is not visualized. No aortic stenosis is present. There is a 29 mm Edwards Sapien prosthetic (TAVR) valve present in the aortic position. Procedure Date: 01/27/2021.  7. Aortic dilatation noted. There is mild dilatation of the ascending aorta, measuring 41 mm.  8. The inferior vena cava is normal in size with greater than 50% respiratory variability, suggesting right atrial pressure of 3 mmHg. Comparison(s): Changes from prior study are noted. LVEF appears to be mildly reduced, conisder Limited Echo with contrast for accurate estimation of LVEF. FINDINGS  Left Ventricle: Left ventricular ejection fraction, by estimation, is 45 to 50%. The left ventricle has mildly decreased function. Left  ventricular endocardial border not optimally defined to evaluate regional wall motion. The left ventricular internal cavity size was normal in size. There is moderate left ventricular hypertrophy. Abnormal (paradoxical) septal motion, consistent with RV pacemaker. Left ventricular diastolic parameters are consistent with Grade I diastolic dysfunction (impaired relaxation). Elevated left ventricular end-diastolic pressure. Right Ventricle: The right ventricular size is normal. No increase in right ventricular wall thickness. Right ventricular systolic function is normal. Tricuspid regurgitation signal is inadequate for assessing PA pressure. Left Atrium: Left atrial size was normal in size. Right Atrium: Right atrial size was mild to moderately dilated. Pericardium: There is no evidence of pericardial effusion. Mitral Valve: The mitral valve is grossly normal. No evidence of mitral valve regurgitation. No evidence of mitral valve stenosis. Tricuspid Valve: The tricuspid valve is not well visualized. Tricuspid valve regurgitation is not demonstrated. No evidence of tricuspid stenosis. Aortic Valve: The aortic valve has been repaired/replaced. Aortic valve regurgitation is not visualized. No aortic stenosis is present. Aortic valve mean gradient measures 10.0 mmHg. Aortic valve peak gradient measures 16.8 mmHg. Aortic valve area, by VTI measures 1.36 cm. There is a 29 mm Edwards Sapien prosthetic, stented (TAVR) valve present in the aortic position. Procedure Date: 01/27/2021. Pulmonic Valve: The pulmonic valve was normal in structure. Pulmonic valve regurgitation is not visualized. No evidence of pulmonic stenosis. Aorta: Aortic dilatation noted. There is mild dilatation of the ascending aorta, measuring 41 mm. Venous: The inferior vena cava is normal in size with greater than 50% respiratory variability, suggesting right atrial pressure of 3 mmHg. IAS/Shunts: No atrial level shunt detected by color flow Doppler.  LEFT  VENTRICLE PLAX 2D LVIDd:         5.00 cm   Diastology LVIDs:         2.70 cm   LV e' medial:    3.37 cm/s LV PW:         1.40 cm   LV E/e' medial:  21.4 LV IVS:        1.50 cm   LV e' lateral:   3.37 cm/s LVOT diam:     2.00 cm   LV E/e' lateral: 21.4 LV SV:         62 LV SV Index:   28 LVOT Area:     3.14 cm  RIGHT VENTRICLE RV S prime:     13.70 cm/s TAPSE (M-mode): 3.5 cm LEFT ATRIUM             Index        RIGHT ATRIUM           Index LA Vol (A2C):   64.6 ml 29.28 ml/m  RA Area:     23.80 cm LA Vol (A4C):   67.4 ml 30.55 ml/m  RA Volume:   74.30 ml  33.68 ml/m LA Biplane Vol: 68.2 ml 30.92 ml/m  AORTIC VALVE AV Area (Vmax):    1.42 cm AV Area (Vmean):   1.67 cm AV Area (VTI):     1.36 cm AV Vmax:           205.00 cm/s AV Vmean:          151.000 cm/s AV VTI:            0.454 m AV Peak Grad:      16.8 mmHg AV Mean Grad:      10.0  mmHg LVOT Vmax:         92.70 cm/s LVOT Vmean:        80.500 cm/s LVOT VTI:          0.197 m LVOT/AV VTI ratio: 0.43 MITRAL VALVE MV Area (PHT): 2.26 cm     SHUNTS MV Decel Time: 335 msec     Systemic VTI:  0.20 m MV E velocity: 72.00 cm/s   Systemic Diam: 2.00 cm MV A velocity: 105.00 cm/s MV E/A ratio:  0.69 Vishnu Priya Mallipeddi Electronically signed by Winfield Rast Mallipeddi Signature Date/Time: 03/06/2023/2:14:17 PM    Final    DG CHEST PORT 1 VIEW Result Date: 03/06/2023 CLINICAL DATA:  Dyspnea. EXAM: PORTABLE CHEST 1 VIEW COMPARISON:  03/14/2022 FINDINGS: The cardiopericardial silhouette is within normal limits for size. Status post TAVR. Left permanent pacemaker again noted. Right IJ central line tip overlies the region of the innominate vein confluence. The lungs are clear without focal pneumonia, edema, pneumothorax or pleural effusion. No acute bony abnormality. IMPRESSION: No active disease. Electronically Signed   By: Kennith Center M.D.   On: 03/06/2023 09:46         Scheduled Meds:  allopurinol  100 mg Oral Daily   ALPRAZolam  0.25 mg Oral QHS    amiodarone  200 mg Oral Daily   amLODipine  10 mg Oral Daily   [START ON 03/09/2023] aspirin  81 mg Oral Pre-Cath   [START ON 03/09/2023] aspirin EC  81 mg Oral Daily   atorvastatin  10 mg Oral Daily   carvedilol  12.5 mg Oral BID   Chlorhexidine Gluconate Cloth  6 each Topical Daily   vitamin B-12  1,000 mcg Oral Daily   isosorbide mononitrate  30 mg Oral Daily   levothyroxine  75 mcg Oral Q0600   mirtazapine  7.5 mg Oral QHS   sodium bicarbonate  1,300 mg Oral TID   sodium chloride flush  3 mL Intravenous Q12H   Continuous Infusions:  sodium chloride 50 mL/hr at 03/08/23 0619   cefTRIAXone (ROCEPHIN)  IV Stopped (03/07/23 1007)   DAPTOmycin Stopped (03/07/23 1212)   heparin 1,600 Units/hr (03/08/23 0600)     LOS: 7 days    Time spent: 35 minutes    Eily Louvier A Jaran Sainz, MD [facility]   If 7PM-7AM, please contact night-coverage [email or web address]  03/08/2023, 7:10 AM

## 2023-03-08 NOTE — Progress Notes (Addendum)
Patient Name: Jordan Oconnell Date of Encounter: 03/08/2023 [facility] Cardiologist: Thurmon Fair, MD   Interval Summary  .    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 S/P right fourth toe disarticulation at metatarsophalangeal joint by Ortho on 03/02/23, saw by ID and recommend 6 weeks of antibiotic. Cardiology consulted 03/06/23 for new onset of chest pressure that morning, improved with nitroglycerin. Hs trop 29>>161 >>428. He has known CAD per pre-TAVR cath 10/06/20, mod disease of LAD and Cx, tight stenosis in D1 and D2. These vessels were fairly small and were not candidates for revascularization. RCA was nondominant and small and patent. He was stopped on Eliquis and transitioned to hepairn gtt. LHC was recommended by Dr Servando Salina 2/10.    Patient states he felt vague about cardiac cath that was recommended yesterday. He had no chest pain since 03/06/23. He clearly expressed that he does not want to go on dialysis, even if it's a small chance. He was informed by his nephrology team to avoid contrast. He states he wants Dr C and his son updated. He states he had been through a lot with his health. He states before his foot infection he was exercising regularly and was doing well. He denied any SOB, dizziness, syncope, leg edema.   Vital Signs .    Vitals:   03/08/23 0400 03/08/23 0415 03/08/23 0431 03/08/23 0600  BP: (!) 135/53   (!) 148/51  Pulse: 60   (!) 59  Resp: (!) 22   (!) 24  Temp:  98.2 F (36.8 C)     TempSrc:  Oral    SpO2: 93%   100%  Weight:   86.8 kg   Height:        Intake/Output Summary (Last 24 hours) at 03/08/2023 0749 Last data filed at 03/08/2023 0600 Gross per 24 hour  Intake 1279.36 ml  Output 2300 ml  Net -1020.64 ml      03/08/2023    4:31 AM 03/07/2023    5:00 AM 03/04/2023    5:00 AM  Last 3 Weights  Weight (lbs) 191 lb 5.8 oz 197 lb 15.6 oz 212 lb 1.3 oz  Weight (kg) 86.8 kg 89.8 kg 96.2 kg      Telemetry/ECG    Mostly AV paced  - Personally Reviewed  Physical Exam .   GEN: No acute distress.   Neck: No JVD Cardiac: RRR, soft murmur  Respiratory: Clear to auscultation bilaterally. On room air GI: Soft, nontender, non-distended  MS: No leg edema  Assessment & Plan .     NSTEMI CAD - admitted for tight 4th toe osteomyelitis, s/p right fourth toe disarticulation at metatarsophalangeal joint 03/02/23, developed new onset chest pain 03/06/23 morning improved with nitroglycerin, Hs trop peaked to 400s, started heparin gtt and LHC was recommended by Dr Servando Salina 03/07/23 - last LHC 09/2020 pre-TAVR: mod disease of LAD and Cx, tight stenosis in D1 and D2. These vessels were fairly small and were not candidates for revascularization. RCA was nondominant and small and patent.  - Echo 03/06/23: LVEF 45-50%, difficult assess WMA, grade I DD,  elevated LVEDP, normal RV, normal aortic valve prosthesis position and function (EF dropped from 50-55% on 08/2022) - complex urological cancer history + DM leading to CKD IV, follow [facility] neph[facility], Cr baseline 2.5-3 over the past year, high risk for CIN given his underlying advanced kidney disease, dialysis for ESRD was planned in the future per his last nephrology visit  - upon discussion with the patient today, reviewed risk versus benefit of cardiac cath, he clearly expressed wish that he does not want any chance to potentially cause more damage to his kidney and certainly not ready for dialysis if this is indicated. He declined cardiac cath  today, prefers medical management for CAD. This is reasonable given his complex medical history, advanced kidney disease and age, chest pain free, and active orthopedic infection. Called his son for update who also supports this decision making. Called Dr C per patient's request, will provide update if hear back from Dr C.  - Medical therapy complete 48 hours heparin gtt then transition back to PTA Eliquis; started ASA 81mg  daily, Imdur 30mg  daily, and amlodipine 10mg  this admission, BP tolerating/ can continue this anti-anginal regimen; continue PTA coreg 12.5mg  BID; LDL 34>>check Lpa, A1C 4.9%, will add lipitor 20mg  for now    Chronic systolic hear failure  - euvolemic on exam today, not on PTA Loop diuretic, can use PRN Lasix  - GDMT: continue coreg, not candidate for ARNI/ARB/SGLT2I/MRA due to kidney condition, added imdur this admission, BP OK, will add low dose hydralazine 25mg  BID   Paroxysmal A fib/flutter - <1% burden based on interrogation of PPM 02/22/23   Tachy-brady syndrome s/p dual chamber pacemaker implant  - 02/22/23 interrogation with  normal device function.  10 AHR detections, longest 12 minutes, EGMs consistent with AFL.     Hx of aortic stenosis s/p TAVR  - stable prosthesis based on Echo   For questions or updates, please contact [facility] Please consult [email or web address] for contact info under     Signed, Cyndi Bender, NP   Patient seen and examined, note reviewed with the signed Advanced Practice Provider. I personally reviewed laboratory data, imaging studies and relevant notes. I independently examined the patient and formulated the important aspects of the plan. I have personally discussed the plan with the patient and/or family. Comments or changes to the note/plan are indicated below.   NSTEMI-discussed with the patient yesterday about her heart catheterization unfortunately today he prefers not to go through with this.  He has discussed this with her family  members which he notes are all on board.  Please stop heparin drip today.  Resume his Eliquis.   Despite Imdur 30 mg daily, blood pressure still elevated, limited on increasing the carvedilol due to bradycardia, on amlodipine 10 mg daily, hydralazine 25 mg has been started twice daily will continue this for now this was started today I agree with that.  In terms of his atrial fibrillation continue amiodarone, carvedilol.  Restart Eliquis.   Thomasene Ripple DO, MS [facility] Attending Cardiologist [facility]  [address] [place], [place] [address] [phone] Website: [email or web address]

## 2023-03-08 NOTE — TOC Progression Note (Signed)
Transition of Care [facility]) - Progression Note    Patient Details  Name: Jordan Oconnell MRN: [id number] Date of Birth: 1940-12-06  Transition of Care [facility]) CM/SW Contact  Otelia Santee, LCSW Phone Number: 03/08/2023, 12:37 PM  Clinical Narrative:    Pt agreeable to [facility] w/ [facility] or [facility] due to waiving co-pay for IV abx. HHPT/OT/RN has been arranged with [facility]. Pam w/ [facility] following for IV abx at discharge. Will need to order RW for pt closer to time of discharge.         Expected Discharge Plan and Services   Discharge Planning Services: NA   Living arrangements for the past 2 months: Single Family Home                           HH Arranged: RN, PT, OT [facility] Agency: [facility] Date [facility] Agency Contacted: 03/08/23 Time HH Agency Contacted: 1237 Representative spoke with at [facility] Agency: Cindie   Social Determinants of Health (SDOH) Interventions SDOH Screenings   Food Insecurity: No Food Insecurity (03/01/2023)  Housing: Low Risk  (03/02/2023)  Transportation Needs: No Transportation Needs (03/01/2023)  Utilities: Not At Risk (03/01/2023)  Social Connections: Unknown (03/01/2023)  Tobacco Use: Low Risk  (03/02/2023)    Readmission Risk Interventions    03/08/2023   12:36 PM  Readmission Risk Prevention Plan  Transportation Screening Complete  PCP or Specialist Appt within 3-5 Days Complete  HRI or Home Care Consult Complete  Social Work Consult for Recovery Care Planning/Counseling Complete  Palliative Care Screening Not Applicable  Medication Review [employer]) Complete

## 2023-03-08 NOTE — Progress Notes (Signed)
PHARMACY - ANTICOAGULATION CONSULT NOTE  Pharmacy Consult for heparin  Indication: chest pain/ACS; hx afib (PTA Eliquis on hold)  Allergies  Allergen Reactions   Amoxicillin Other (See Comments)    Acute interstitial nephritis  Other Reaction(s): Not available   Tamsulosin     Other Reaction(s): Not available   Doxycycline Hives    Other Reaction(s): Not available   Flomax [Tamsulosin Hcl] Other (See Comments)    Dizzy     Patient Measurements: Height: 6\' 1"  (185.4 cm) Weight: 86.8 kg (191 lb 5.8 oz) IBW/kg (Calculated) : 79.9 Heparin Dosing Weight: 87 kg  Vital Signs: Temp: 98.2 F (36.8 C) (02/11 0415) Temp Source: Oral (02/11 0415) BP: 148/51 (02/11 0600) Pulse Rate: 59 (02/11 0600)  Labs: Recent Labs    03/06/23 0309 03/06/23 0939 03/06/23 2030 03/07/23 0500 03/07/23 0942 03/07/23 0943 03/07/23 1602 03/08/23 0059 03/08/23 0427  HGB 10.7*  --   --  10.2*  --   --   --   --   --   HCT 34.5*  --   --  32.6*  --   --   --   --   --   PLT 66*  --   --  64*  --   --   --   --   --   APTT  --   --   --  65*  --   --  58* 97*  --   HEPARINUNFRC  --   --   --  >1.10*  --   --   --   --  1.05*  CREATININE 2.77*  --   --   --   --  2.58*  --   --   --   TROPONINIHS  --    < > 294* 425* 428*  --   --   --   --    < > = values in this interval not displayed.    Estimated Creatinine Clearance: 24.5 mL/min (A) (by C-G formula based on SCr of 2.58 mg/dL (H)).   Medical History: Past Medical History:  Diagnosis Date   Bladder cancer (HCC)    BPH (benign prostatic hyperplasia)    CKD (chronic kidney disease), stage IV (HCC)    Colon polyps    Diverticulosis    GERD (gastroesophageal reflux disease)    Hyperlipidemia    pt denies   Hypertension    PAF (paroxysmal atrial fibrillation) (HCC)    on Flecanide and diltiazem. No OAC given recurrent hematuria   Presence of permanent cardiac pacemaker    Renal cell carcinoma (HCC) 2008   left   S/P TAVR  (transcatheter aortic valve replacement) 01/27/2021   s/p TAVR with a 29 mm Edwards S3UR via the TF approach by Dr. Excell Seltzer and Dr. Laneta Simmers   Severe aortic stenosis 10/05/2020    Medications:  - on Eliquis 2.5 mg bid PTA   Assessment: Patient is an 83 y.o M with hx thrombocytopenia and afib on Eliquis PTA who was admitted on 03/01/23 for treatment of right fourth toe infection/osteomyelitis.He underwent right 4th toe disarticulation at metatarsophalangeal joint  on 03/02/23. Eliquis resumed post-op on 03/03/23. He complained of CP on 03/06/23 and found to have elevated troponin.  Eliquis changed to heparin drip on 03/06/23 for ACS.  Today, 03/08/2023: - confirmatory heparin level collected at  10:43a  remains therapeutic at 91 secs  - hgb down slightly to 9.3, plts low but trending up 74K - no bleeding  documented  - patient does not want to proceed with cardiac cath. Dr. Servando Salina now recom medical management with 48 hrs of heparin drip, then transition back to Eliquis. Heparin drip started on 2/9 at 10p.   Goal of Therapy:  Heparin level 0.3-0.7 units/ml aPTT 66-102 seconds Monitor platelets by anticoagulation protocol: Yes   Plan:  - Continue heparin drip at 1600 units/hr.  - Per Dr. Servando Salina via [facility] msg, ok to resume Eliquis back at 10p tonight. Will d/c heparin drip at 10p and start Eliquis 2.5mg  bid  - Pharmacy will sign off. Re-consult [place] if need further assistance. - monitor for s/sx bleeding  Lucia Gaskins 03/08/2023,7:01 AM

## 2023-03-08 NOTE — Plan of Care (Signed)
  Problem: Education: Goal: Knowledge of General Education information will improve Description: Including pain rating scale, medication(s)/side effects and non-pharmacologic comfort measures Outcome: Progressing   Problem: Health Behavior/Discharge Planning: Goal: Ability to manage health-related needs will improve Outcome: Progressing   Problem: Clinical Measurements: Goal: Ability to maintain clinical measurements within normal limits will improve Outcome: Progressing Goal: Will remain free from infection Outcome: Progressing Goal: Diagnostic test results will improve Outcome: Progressing Goal: Respiratory complications will improve Outcome: Progressing Goal: Cardiovascular complication will be avoided Outcome: Progressing   Problem: Activity: Goal: Risk for activity intolerance will decrease Outcome: Progressing   Problem: Nutrition: Goal: Adequate nutrition will be maintained Outcome: Progressing   Problem: Coping: Goal: Level of anxiety will decrease Outcome: Progressing   Problem: Elimination: Goal: Will not experience complications related to bowel motility Outcome: Progressing Goal: Will not experience complications related to urinary retention Outcome: Progressing   Problem: Pain Managment: Goal: General experience of comfort will improve and/or be controlled Outcome: Progressing   Problem: Safety: Goal: Ability to remain free from injury will improve Outcome: Progressing   Problem: Skin Integrity: Goal: Risk for impaired skin integrity will decrease Outcome: Progressing   Cindy S. Clelia Croft BSN, RN, CCRP, CCRN 03/08/2023 3:51 AM

## 2023-03-09 DIAGNOSIS — I214 Non-ST elevation (NSTEMI) myocardial infarction: Secondary | ICD-10-CM | POA: Diagnosis not present

## 2023-03-09 DIAGNOSIS — I480 Paroxysmal atrial fibrillation: Secondary | ICD-10-CM | POA: Diagnosis not present

## 2023-03-09 DIAGNOSIS — I10 Essential (primary) hypertension: Secondary | ICD-10-CM | POA: Diagnosis not present

## 2023-03-09 DIAGNOSIS — M869 Osteomyelitis, unspecified: Secondary | ICD-10-CM | POA: Diagnosis not present

## 2023-03-09 NOTE — Plan of Care (Signed)

## 2023-03-09 NOTE — Progress Notes (Signed)
PROGRESS NOTE  Jordan Oconnell BJY:[id number] DOB: 1941-01-14 DOA: 03/01/2023 PCP: Geoffry Paradise, MD   LOS: 8 days   Brief Narrative / Interim history: 83 year old with past medical history significant for PAF on Eliquis, aortic stenosis status post TAVR, tachybradycardia syndrome, status post pacemaker, CKD stage IV, urothelial cancer status post multiple TURP with urostomy in place, RCC status post left nephrectomy with recurrent urothelial disease to solitary right renal pelvis, hypothyroidism who was a direct admission for management of osteomyelitis of the fourth toe.  Patient directly admitted per Dr. Odis Hollingshead for toe amputation.  Hospital course complicated by chest pain to/9, developed non-STEMI, troponin was elevated, cardiology consulted  Subjective / 24h Interval events: He is feeling much better today.  Denies any chest pain, denies any shortness of breath.  Assesement and Plan: Principal Problem:   Osteomyelitis of fourth toe of right foot (HCC) Active Problems:   Urothelial cancer (HCC)   PAF (paroxysmal atrial fibrillation) (HCC)   CKD (chronic kidney disease) stage 4, GFR 15-29 ml/min (HCC)   S/P TAVR (transcatheter aortic valve replacement)   Chronic combined systolic and diastolic CHF (congestive heart failure) (HCC)   Hypothyroidism   Non-ST elevation (NSTEMI) myocardial infarction [facility])   Primary hypertension   Principal problem Angina, NSTEMI -Patient develops chest pressure 2/9, with troponin elevation into the 400 range.  Cardiology consulted and followed patient while hospitalized.  He was placed on heparin infusion, transferred to stepdown and plans were in place for patient to undergo cardiac catheterization, however after discussions between the patient and the cardiology he declined to undergo cardiac cath and will be managed medically.  Underwent a 2D echo which showed LVEF 45-50%, no obvious wall motion abnormalities, grade 1 diastolic dysfunction. -He  is now chest pain-free, has completed heparin as a medical treatment for non-STEMI and transition back to Eliquis -Transfer to telemetry  Active problems Osteomyelitis of the Right fourth toe -Presented as a direct admission per orthopedic surgery. Underwent right fourth toe disarticulation at metatarsophalangeal joint.  ID consulted as well, recommend 6 weeks of IV antibiotics with ceftriaxone and Cubicin.  He had a central line placed 2/7 per IR.  WBAT operative extremity in postop shoe, elevation recommended.   Acute kidney injury on CKD  Stage IV -baseline creatinine seems to be around 2.4-2.7, it was up to 3.7 this hospitalization, now back to baseline at 2.6.  Urothelial cancer s/p urostomy and RCC s/p left nephrectomy with recurrent disease to solitary right kidney -Has multifocal urothelial disease, follows with [facility] oncology and nephrology -On active surveillance after most recent treatment being radiation therapy in 12 months of pembrolizumab -Continue with urostomy care   Paroxysmal atrial fibrillation/flutter, tachy-brady syndrome s/p PPM -Continue Coreg, amiodarone, Eliquis   Hypertension - Started Norvasc. Monitor BP  Chronic combined systolic and diastolic CHF - Last EF improved to 50-55% (09/22/2022) compared to prior 30-35% (06/02/2022).  Appears euvolemic.  Continue medical management   Hypothyroidism - Continue Synthroid.   Severe aortic stenosis s/p TAVR 01/2021 - Stable on last echo 09/22/2022.   Insomnia - Continue mirtazapine and Xanax 0.25 mg nightly.   Thrombocytopenia - follow trend.  Overall stable, no bleeding  B12 deficiency - Started intramuscular supplement, transition to oral supplement.    Right foot pain - Uric acid elevated, probably gout. Will start Allopurinol. Received a dose of Colchicine.    Scheduled Meds:  allopurinol  100 mg Oral Daily   ALPRAZolam  0.25 mg Oral QHS   amiodarone  200 mg Oral Daily   amLODipine  10 mg Oral Daily    apixaban  2.5 mg Oral BID   aspirin EC  81 mg Oral Daily   atorvastatin  10 mg Oral Daily   carvedilol  12.5 mg Oral BID   Chlorhexidine Gluconate Cloth  6 each Topical Daily   vitamin B-12  1,000 mcg Oral Daily   hydrALAZINE  25 mg Oral BID   isosorbide mononitrate  30 mg Oral Daily   levothyroxine  75 mcg Oral Q0600   mirtazapine  7.5 mg Oral QHS   polyethylene glycol  17 g Oral Daily   sodium bicarbonate  1,300 mg Oral TID   sodium chloride flush  3 mL Intravenous Q12H   Continuous Infusions:  cefTRIAXone (ROCEPHIN)  IV 2 g (03/09/23 1046)   DAPTOmycin 700 mg (03/09/23 1332)   PRN Meds:.acetaminophen **OR** acetaminophen, bisacodyl, HYDROmorphone (DILAUDID) injection, nitroGLYCERIN, ondansetron **OR** ondansetron (ZOFRAN) IV, oxyCODONE, senna-docusate, sodium chloride flush  Current Outpatient Medications  Medication Instructions   acetaminophen (TYLENOL) 1,000-1,500 mg, 2 times daily PRN   ALPRAZolam (XANAX) 0.25 mg, Daily at bedtime   amiodarone (PACERONE) 200 mg, Oral, Daily   azithromycin (ZITHROMAX) 500 MG tablet TAKE 1 TABLET BY MOUTH ONCE DAILY 1  HOUR  BEFORE  DENTAL  PROCEDURES  AND  CLEANINGS   carvedilol (COREG) 12.5 mg, 2 times daily   cephALEXin (KEFLEX) 500 mg, Every 6 hours   Eliquis 2.5 mg, Oral, 2 times daily   ferrous sulfate 325 mg, Oral, 2 times daily   levothyroxine (SYNTHROID) 75 mcg, Daily before breakfast   mirtazapine (REMERON) 7.5 mg, Daily at bedtime   sodium bicarbonate 1,300 mg, 2 times daily   sulfamethoxazole-trimethoprim (BACTRIM) 400-80 MG tablet 1 tablet, 2 times daily    Diet Orders (From admission, onward)     Start     Ordered   03/08/23 0911  Diet renal with fluid restriction Fluid restriction: 1200 mL Fluid; Room service appropriate? Yes; Fluid consistency: Thin  Diet effective now       Question Answer Comment  Fluid restriction: 1200 mL Fluid   Room service appropriate? Yes   Fluid consistency: Thin      03/08/23 0910             DVT prophylaxis: apixaban (ELIQUIS) tablet 2.5 mg Start: 03/08/23 2200 SCDs Start: 03/01/23 2313 apixaban (ELIQUIS) tablet 2.5 mg   Lab Results  Component Value Date   PLT 74 (L) 03/08/2023      Code Status: Full Code  Family Communication: Son Decorian over the phone  Status is: Inpatient  Remains inpatient appropriate because:   Level of care: Telemetry  Consultants:  ID Cards  Objective: Vitals:   03/09/23 0400 03/09/23 0600 03/09/23 0800 03/09/23 1030  BP: (!) 118/53 (!) 110/43  (!) 150/55  Pulse: 60 60  60  Resp: 15 19    Temp: 98 F (36.7 C)  97.8 F (36.6 C)   TempSrc: Oral  Oral   SpO2: 95% 93%    Weight:      Height:        Intake/Output Summary (Last 24 hours) at 03/09/2023 1344 Last data filed at 03/09/2023 0940 Gross per 24 hour  Intake 1489.7 ml  Output 1750 ml  Net -260.3 ml   Wt Readings from Last 3 Encounters:  03/09/23 86.4 kg  10/06/22 87.5 kg  06/14/22 80.9 kg    Examination:  Constitutional: NAD Eyes: no scleral icterus ENMT: Mucous membranes  are moist.  Neck: normal, supple Respiratory: clear to auscultation bilaterally, no wheezing, no crackles. Normal respiratory effort. No accessory muscle use.  Cardiovascular: Regular rate and rhythm, no murmurs / rubs / gallops. No LE edema.  Abdomen: non distended, no tenderness. Bowel sounds positive.   Data Reviewed: I have independently reviewed following labs and imaging studies   CBC Recent Labs  Lab 03/03/23 0620 03/04/23 0807 03/06/23 0309 03/07/23 0500 03/08/23 0628  WBC 4.6 4.1 4.8 4.0 3.7*  HGB 11.0* 11.8* 10.7* 10.2* 9.3*  HCT 37.1* 38.9* 34.5* 32.6* 30.5*  PLT 65* 69* 66* 64* 74*  MCV 106.6* 105.1* 103.9* 102.5* 102.7*  MCH 31.6 31.9 32.2 32.1 31.3  MCHC 29.6* 30.3 31.0 31.3 30.5  RDW 14.5 14.1 13.8 13.3 13.3    Recent Labs  Lab 03/03/23 0620 03/04/23 0534 03/04/23 0807 03/06/23 0309 03/06/23 0939 03/07/23 0943 03/08/23 0628  NA 137  --  139 139   --  136 135  K 4.7  --  4.9 4.5  --  4.2 4.6  CL 108  --  111 109  --  107 108  CO2 17*  --  20* 21*  --  19* 20*  GLUCOSE 138*  --  103* 118*  --  156* 99  BUN 42*  --  46* 55*  --  64* 69*  CREATININE 3.00*  --  2.73* 2.77*  --  2.58* 2.64*  CALCIUM 8.8*  --  9.4 8.9  --  8.9 8.8*  MG  --   --   --   --  2.1  --   --   CRP  --  4.4*  --   --   --   --   --   BNP  --   --   --   --  151.0*  --   --     ------------------------------------------------------------------------------------------------------------------ Recent Labs    03/08/23 0427  CHOL 82  HDL 34*  LDLCALC 34  TRIG 69  CHOLHDL 2.4    Lab Results  Component Value Date   HGBA1C 4.9 03/12/2022   ------------------------------------------------------------------------------------------------------------------ No results for input(s): "TSH", "T4TOTAL", "T3FREE", "THYROIDAB" in the last 72 hours.  Invalid input(s): "FREET3"  Cardiac Enzymes No results for input(s): "CKMB", "TROPONINI", "MYOGLOBIN" in the last 168 hours.  Invalid input(s): "CK" ------------------------------------------------------------------------------------------------------------------    Component Value Date/Time   BNP 151.0 (H) 03/06/2023 0939    CBG: No results for input(s): "GLUCAP" in the last 168 hours.  Recent Results (from the past 240 hours)  Surgical PCR screen     Status: None   Collection Time: 03/02/23 12:53 AM   Specimen: Nasal Mucosa; Nasal Swab  Result Value Ref Range Status   MRSA, PCR NEGATIVE NEGATIVE Final   Staphylococcus aureus NEGATIVE NEGATIVE Final    Comment: (NOTE) The Xpert SA Assay (FDA approved for NASAL specimens in patients 80 years of age and older), is one component of a comprehensive surveillance program. It is not intended to diagnose infection nor to guide or monitor treatment. Performed at [facility], [address] [place], [place] [address]   Aerobic/Anaerobic  Culture w Gram Stain (surgical/deep wound)     Status: None   Collection Time: 03/02/23  4:37 PM   Specimen: Toe, Right; Amputation  Result Value Ref Range Status   Specimen Description   Final    TOE RIGHT 4TH TOE Performed at [facility], [address] [place], [place] [address]  Special Requests   Final    NONE Performed at [facility], [address] [place], [place] [address]    Gram Stain NO WBC SEEN NO ORGANISMS SEEN   Final   Culture   Final    No growth aerobically or anaerobically. Performed at [facility], [address] [place], [place] [address]    Report Status 03/07/2023 FINAL  Final     Radiology Studies: No results found.   Pamella Pert, MD, PhD [facility]  Between 7 am - 7 pm I am available, please contact me via Amion (for emergencies) or Securechat (non urgent messages)  Between 7 pm - 7 am I am not available, please contact night coverage MD/APP via Amion

## 2023-03-09 NOTE — Progress Notes (Signed)
Occupational Therapy Treatment Patient Details Name: GUNNARD DORRANCE MRN: [id number] DOB: 07-Sep-1940 Today's Date: 03/09/2023   History of present illness 83 year old with past medical history significant for PAF on Eliquis, aortic stenosis status post TAVR, tachybradycardia syndrome, status post pacemaker, CKD stage IV, urothelial cancer status post multiple TURP with urostomy in place, RCC status post left nephrectomy with recurrent urothelial disease to solitary right renal pelvis, hypothyroidism who was a direct admission for management of osteomyelitis of the fourth toe.  Patient directly admitted per Dr. Odis Hollingshead for toe amputation   OT comments  Pt with chest pressure 03/06/23 and requiring nitroglycerin, MD requested therapy to hold and pt transferred to ICU. Pt reports no chest pains as of 03/07/23, however pt demos a decline in function with ADLs and mobility since OT eval. Pt very cooperative and motivated, but fatigues easily and requires increased assist with bed mobility to sit EOB, sit - stand, LB ADLs and assist with LEs back into bed. Recommend post acute rehab >3 hours to maximize level of function and safety before return home        If plan is discharge home, recommend the following:  A lot of help with bathing/dressing/bathroom;A little help with walking and/or transfers;Assistance with cooking/housework;Assist for transportation;Help with stairs or ramp for entrance   Equipment Recommendations  Other (comment) (TBD at next venue of care)    Recommendations for Other Services Rehab consult    Precautions / Restrictions Precautions Precautions: Fall Precaution/Restrictions Comments: WBAT in post-op shoe Required Braces or Orthoses: Other Brace Other Brace: Post-op shoe Restrictions Weight Bearing Restrictions Per Provider Order: Yes RLE Weight Bearing Per Provider Order: Weight bearing as tolerated Other Position/Activity Restrictions: with R post op shoe        Mobility Bed Mobility Overal bed mobility: Needs Assistance Bed Mobility: Supine to Sit, Sit to Supine     Supine to sit: Mod assist Sit to supine: Mod assist   General bed mobility comments: mod A with Trunk and LE mgt to EOB and back onto bed    Transfers Overall transfer level: Needs assistance Equipment used: Rolling walker (2 wheels) Transfers: Sit to/from Stand Sit to Stand: Min assist           General transfer comment: min A from EOB to RW , CGA from [facility] to RW     Balance Overall balance assessment: Needs assistance Sitting-balance support: Feet supported Sitting balance-Leahy Scale: Fair     Standing balance support: Bilateral upper extremity supported, During functional activity, Reliant on assistive device for balance Standing balance-Leahy Scale: Poor                             ADL either performed or assessed with clinical judgement   ADL Overall ADL's : Needs assistance/impaired Eating/Feeding: Independent   Grooming: Wash/dry hands;Wash/dry face;Contact guard assist;Sitting   Upper Body Bathing: Contact guard assist;Sitting   Lower Body Bathing: Moderate assistance   Upper Body Dressing : Contact guard assist;Sitting   Lower Body Dressing: Moderate assistance   Toilet Transfer: Minimal assistance;Contact guard assist;Rolling walker (2 wheels);BSC/3in1   Toileting- Clothing Manipulation and Hygiene: Moderate assistance;Sit to/from stand Toileting - Clothing Manipulation Details (indicate cue type and reason): mod A clothing mgt, max A posterior hygiene     Functional mobility during ADLs: Minimal assistance;Contact guard assist;Rolling walker (2 wheels);Cueing for safety      Extremity/Trunk Assessment Upper Extremity Assessment Upper Extremity Assessment: Generalized weakness  Lower Extremity Assessment Lower Extremity Assessment: Defer to PT evaluation        Vision Ability to See in Adequate Light: 0  Adequate Patient Visual Report: No change from baseline     Perception     Praxis     Communication Communication Factors Affecting Communication: Hearing impaired   Cognition Arousal: Alert Behavior During Therapy: WFL for tasks assessed/performed Cognition: No apparent impairments                               Following commands: Intact        Cueing      Exercises      Shoulder Instructions       General Comments      Pertinent Vitals/ Pain       Pain Assessment Pain Assessment: No/denies pain Pain Score: 0-No pain Pain Intervention(s): Monitored during session, Repositioned  Home Living Family/patient expects to be discharged to:: Private residence                                        Prior Functioning/Environment              Frequency  Min 2X/week        Progress Toward Goals  OT Goals(current goals can now be found in the care plan section)  Progress towards OT goals: OT to reassess next treatment     Plan      Co-evaluation    PT/OT/SLP Co-Evaluation/Treatment: Yes Reason for Co-Treatment: Complexity of the patient's impairments (multi-system involvement);For patient/therapist safety;To address functional/ADL transfers   OT goals addressed during session: ADL's and self-care;Proper use of Adaptive equipment and DME      AM-PAC OT "6 Clicks" Daily Activity     Outcome Measure   Help from another person eating meals?: None Help from another person taking care of personal grooming?: A Little Help from another person toileting, which includes using toliet, bedpan, or urinal?: A Lot Help from another person bathing (including washing, rinsing, drying)?: A Lot Help from another person to put on and taking off regular upper body clothing?: A Little Help from another person to put on and taking off regular lower body clothing?: A Lot 6 Click Score: 16    End of Session Equipment Utilized During  Treatment: Gait belt;Rolling walker (2 wheels);Other (comment) (BSC)  OT Visit Diagnosis: Unsteadiness on feet (R26.81);Other abnormalities of gait and mobility (R26.89);Pain   Activity Tolerance Patient limited by fatigue   Patient Left with call bell/phone within reach;in bed   Nurse Communication Mobility status        Time: 1344-1415 OT Time Calculation (min): 31 min  Charges: OT General Charges $OT Visit: 1 Visit OT Treatments $Self Care/Home Management : 8-22 mins    Galen Manila 03/09/2023, 3:47 PM

## 2023-03-09 NOTE — Progress Notes (Signed)
Pt report called to Arlean Hopping RN on 4E.  Pt notified his family of the transfer via voice message and declines RN to notify family.  Pt transferred to 4E via bed, with all belongings, including cell phone, x2 hearing aides, hearing aide charger, phone charger, x2 glasses, back pack (with ADLs and ostomy supplies) and clothing/shoes.  Pt denies and without s/s pain, respiratory distress during transport.  Assessment at baseline during transfer.

## 2023-03-09 NOTE — Progress Notes (Signed)
Progress Note  Patient Name: Jordan Oconnell Date of Encounter: 03/09/2023  Primary Cardiologist: Thurmon Fair, MD   Subjective   Patient seen examined by his bedside.  He tells me he is doing so much better.   Inpatient Medications    Scheduled Meds:  allopurinol  100 mg Oral Daily   ALPRAZolam  0.25 mg Oral QHS   amiodarone  200 mg Oral Daily   amLODipine  10 mg Oral Daily   apixaban  2.5 mg Oral BID   aspirin EC  81 mg Oral Daily   atorvastatin  10 mg Oral Daily   carvedilol  12.5 mg Oral BID   Chlorhexidine Gluconate Cloth  6 each Topical Daily   vitamin B-12  1,000 mcg Oral Daily   hydrALAZINE  25 mg Oral BID   isosorbide mononitrate  30 mg Oral Daily   levothyroxine  75 mcg Oral Q0600   mirtazapine  7.5 mg Oral QHS   polyethylene glycol  17 g Oral Daily   sodium bicarbonate  1,300 mg Oral TID   sodium chloride flush  3 mL Intravenous Q12H   Continuous Infusions:  cefTRIAXone (ROCEPHIN)  IV 2 g (03/09/23 1046)   DAPTOmycin Stopped (03/07/23 1212)   PRN Meds: acetaminophen **OR** acetaminophen, bisacodyl, HYDROmorphone (DILAUDID) injection, nitroGLYCERIN, ondansetron **OR** ondansetron (ZOFRAN) IV, oxyCODONE, senna-docusate, sodium chloride flush   Vital Signs    Vitals:   03/09/23 0400 03/09/23 0600 03/09/23 0800 03/09/23 1030  BP: (!) 118/53 (!) 110/43  (!) 150/55  Pulse: 60 60  60  Resp: 15 19    Temp: 98 F (36.7 C)  97.8 F (36.6 C)   TempSrc: Oral  Oral   SpO2: 95% 93%    Weight:      Height:        Intake/Output Summary (Last 24 hours) at 03/09/2023 1240 Last data filed at 03/09/2023 0940 Gross per 24 hour  Intake 1489.7 ml  Output 1750 ml  Net -260.3 ml   Filed Weights   03/07/23 0500 03/08/23 0431 03/09/23 0250  Weight: 89.8 kg 86.8 kg 86.4 kg    Telemetry    AV paced rhythm- Personally Reviewed  ECG    None today, yesterday AV paced- Personally Reviewed  Physical Exam    General: Comfortable, sitting up in a chair Head:  Atraumatic, normal size  Eyes: PEERLA, EOMI  Neck: Supple, normal JVD Cardiac: Normal S1, S2; RRR; no murmurs, rubs, or gallops Lungs: Clear to auscultation bilaterally Abd: Soft, nontender, no hepatomegaly  Ext: warm, no edema Musculoskeletal: No deformities, BUE and BLE strength normal and equal Skin: Warm and dry, no rashes   Neuro: Alert and oriented to person, place, time, and situation, CNII-XII grossly intact, no focal deficits  Psych: Normal mood and affect   Labs    Chemistry Recent Labs  Lab 03/06/23 0309 03/07/23 0943 03/08/23 0628  NA 139 136 135  K 4.5 4.2 4.6  CL 109 107 108  CO2 21* 19* 20*  GLUCOSE 118* 156* 99  BUN 55* 64* 69*  CREATININE 2.77* 2.58* 2.64*  CALCIUM 8.9 8.9 8.8*  GFRNONAA 22* 24* 23*  ANIONGAP 9 10 7      Hematology Recent Labs  Lab 03/06/23 0309 03/07/23 0500 03/08/23 0628  WBC 4.8 4.0 3.7*  RBC 3.32* 3.18* 2.97*  HGB 10.7* 10.2* 9.3*  HCT 34.5* 32.6* 30.5*  MCV 103.9* 102.5* 102.7*  MCH 32.2 32.1 31.3  MCHC 31.0 31.3 30.5  RDW 13.8 13.3 13.3  PLT 66* 64* 74*    Cardiac EnzymesNo results for input(s): "TROPONINI" in the last 168 hours. No results for input(s): "TROPIPOC" in the last 168 hours.   BNP Recent Labs  Lab 03/06/23 0939  BNP 151.0*     DDimer No results for input(s): "DDIMER" in the last 168 hours.   Radiology    DG Foot Complete Left Result Date: 03/07/2023 CLINICAL DATA:  Foot infection EXAM: LEFT FOOT - COMPLETE 3+ VIEW COMPARISON:  None Available. FINDINGS: Vascular calcifications. No fracture or malalignment. Small plantar calcaneal spur. Moderate degenerative change at the first MTP joint. IP joint space narrowing. No osseous destructive change or soft tissue emphysema IMPRESSION: No acute osseous abnormality. Degenerative changes. Electronically Signed   By: Jasmine Pang M.D.   On: 03/07/2023 20:57    Cardiac Studies   Reviewed echo from yesterday  Patient Profile     83 y.o. male coronary  artery disease, paroxysmal atrial fibrillation, status post TAVR valve, chronic combined diastolic systolic heart failure  Assessment & Plan    NSTEMI-chest pain has improved he is on medical therapy. Decline heart cath, completed heparin gtt. Continue Aspirin 81 mg daily, continue lipitor  Known CKD seems like creatinine is closer to baseline we will continue to monitor.  Blood pressure with one elevated reading for now continue on current regimen.   Paroxysmal atrial fibrillation/further with history of tachybradycardia syndrome he is status post pacemaker implantation-he is currently AV paced.  Continue his carvedilol, amiodarone as well as heparin drip for now.  Continue Eliquis  For questions or updates, please contact [facility] Please consult [email or web address] for contact info under Cardiology/STEMI.      SignedThomasene Ripple, DO  03/09/2023, 12:40 PM

## 2023-03-09 NOTE — Progress Notes (Signed)
  Inpatient Rehab Admissions Coordinator :  Per therapy recommendations,patient was screened for CIR candidacy by Ottie Glazier RN MSN. Patient is not at a level to tolerate the intensity required to pursue a CIR admit. Recommend other rehab venues to be pursued at this time. Please contact me with any questions.  Ottie Glazier RN MSN Admissions Coordinator [phone]

## 2023-03-09 NOTE — Evaluation (Signed)
Physical Therapy Evaluation Patient Details Name: Jordan Oconnell MRN: [id number] DOB: 1940/10/08 Today's Date: 03/09/2023  History of Present Illness  83 year old with past medical history significant for PAF on Eliquis, aortic stenosis status post TAVR, tachybradycardia syndrome, status post pacemaker, CKD stage IV, urothelial cancer status post multiple TURP with urostomy in place, RCC status post left nephrectomy with recurrent urothelial disease to solitary right renal pelvis, hypothyroidism who was a direct admission for management of osteomyelitis of the fourth toe.  Patient directly admitted per Dr. Odis Hollingshead for toe amputation.  2/9 pt reported chest pressure and improved with nitroglycerin with elevated troponin and new dx of NSTEMI. Pt declined cardiac cath on 2/11.  Clinical Impression   Pt admitted with above diagnosis.  Pt currently with functional limitations due to the deficits listed below (see PT Problem List). Pt in bed when therapist arrived. Pt reported having bowel movement. Pt required mod A for supine <> sit, CGA for sit to stand  from EOB, min A for SPT bed <> BSC. Pt required A to don R post op shoe and L standard shoe, pt required A for hygiene tasks. Pt reported fatigue and requested to return to bed following transfer tasks. Pt left in bed all needs in place. Pending pt progress goals may need to be reassessed and d/c recommendation has been changed. Patient will benefit from intensive inpatient follow-up therapy, >3 hours/day. Pt will benefit from acute skilled PT to increase their independence and safety with mobility to allow discharge.         If plan is discharge home, recommend the following: A little help with walking and/or transfers;A little help with bathing/dressing/bathroom;Assistance with cooking/housework;Assist for transportation;Help with stairs or ramp for entrance   Can travel by private vehicle        Equipment Recommendations Rolling walker (2  wheels)  Recommendations for Other Services       Functional Status Assessment       Precautions / Restrictions Precautions Precautions: Fall Precaution/Restrictions Comments: WBAT in post-op shoe Required Braces or Orthoses: Other Brace Other Brace: Post-op shoe Restrictions Weight Bearing Restrictions Per Provider Order: Yes RLE Weight Bearing Per Provider Order: Weight bearing as tolerated Other Position/Activity Restrictions: with R post op shoe      Mobility  Bed Mobility Overal bed mobility: Needs Assistance Bed Mobility: Supine to Sit, Sit to Supine     Supine to sit: Mod assist Sit to supine: Mod assist   General bed mobility comments: mod A with Trunk and LE mgt to EOB and back onto bed    Transfers Overall transfer level: Needs assistance Equipment used: Rolling walker (2 wheels) Transfers: Sit to/from Stand, Bed to chair/wheelchair/BSC Sit to Stand: Min assist Stand pivot transfers: Min assist         General transfer comment: min A from EOB to RW , CGA from [facility] to RW with cues for posture and RW placement. pt required A to don standard L shoe and R post op for transfer tasks    Ambulation/Gait               General Gait Details: following transfer tasks pt reported he needed to go back to bed and gait NT  Stairs            Wheelchair Mobility     Tilt Bed    Modified Rankin (Stroke Patients Only)       Balance Overall balance assessment: Needs assistance Sitting-balance support: Feet supported Sitting balance-Leahy  Scale: Fair     Standing balance support: Bilateral upper extremity supported, During functional activity, Reliant on assistive device for balance Standing balance-Leahy Scale: Poor Standing balance comment: B UE support at RW and min A                             Pertinent Vitals/Pain Pain Assessment Pain Assessment: No/denies pain    Home Living Family/patient expects to be discharged to::  Private residence                        Prior Function                       Extremity/Trunk Assessment   Upper Extremity Assessment Upper Extremity Assessment: Generalized weakness    Lower Extremity Assessment Lower Extremity Assessment: Defer to PT evaluation       Communication   Communication Factors Affecting Communication: Hearing impaired    Cognition Arousal: Alert Behavior During Therapy: WFL for tasks assessed/performed   PT - Cognitive impairments: No apparent impairments                       PT - Cognition Comments: less impusive as compared to eval Following commands: Intact       Cueing       General Comments      Exercises     Assessment/Plan    PT Assessment    PT Problem List         PT Treatment Interventions      PT Goals (Current goals can be found in the Care Plan section)  Acute Rehab PT Goals Patient Stated Goal: to get stronger and return to IND and walking program PT Goal Formulation: With patient Time For Goal Achievement: 03/18/23 Potential to Achieve Goals: Good    Frequency Min 1X/week     Co-evaluation PT/OT/SLP Co-Evaluation/Treatment: Yes Reason for Co-Treatment: Complexity of the patient's impairments (multi-system involvement);For patient/therapist safety;To address functional/ADL transfers PT goals addressed during session: Mobility/safety with mobility;Balance;Proper use of DME OT goals addressed during session: ADL's and self-care;Proper use of Adaptive equipment and DME       AM-PAC PT "6 Clicks" Mobility  Outcome Measure Help needed turning from your back to your side while in a flat bed without using bedrails?: A Little Help needed moving from lying on your back to sitting on the side of a flat bed without using bedrails?: A Lot Help needed moving to and from a bed to a chair (including a wheelchair)?: A Lot Help needed standing up from a chair using your arms (e.g.,  wheelchair or bedside chair)?: A Lot Help needed to walk in hospital room?: Total Help needed climbing 3-5 steps with a railing? : Total 6 Click Score: 11    End of Session Equipment Utilized During Treatment: Gait belt Activity Tolerance: Patient tolerated treatment well;Patient limited by fatigue Patient left: with call bell/phone within reach;in bed Nurse Communication: Mobility status;Weight bearing status PT Visit Diagnosis: Unsteadiness on feet (R26.81);Other abnormalities of gait and mobility (R26.89);Muscle weakness (generalized) (M62.81);Difficulty in walking, not elsewhere classified (R26.2);Pain Pain - Right/Left: Right Pain - part of body: Ankle and joints of foot    Time: 1344-1419 PT Time Calculation (min) (ACUTE ONLY): 35 min   Charges:     PT Treatments $Therapeutic Activity: 8-22 mins PT General Charges $$ ACUTE PT VISIT: 1 Visit  Johnny Bridge, PT Acute Rehab   Jacqualyn Posey 03/09/2023, 4:43 PM

## 2023-03-10 DIAGNOSIS — M869 Osteomyelitis, unspecified: Secondary | ICD-10-CM | POA: Diagnosis not present

## 2023-03-10 DIAGNOSIS — I480 Paroxysmal atrial fibrillation: Secondary | ICD-10-CM | POA: Diagnosis not present

## 2023-03-10 DIAGNOSIS — I214 Non-ST elevation (NSTEMI) myocardial infarction: Secondary | ICD-10-CM | POA: Diagnosis not present

## 2023-03-10 DIAGNOSIS — I10 Essential (primary) hypertension: Secondary | ICD-10-CM | POA: Diagnosis not present

## 2023-03-10 LAB — PHOSPHORUS: Phosphorus: 2.9 mg/dL (ref 2.5–4.6)

## 2023-03-10 LAB — COMPREHENSIVE METABOLIC PANEL
ALT: 86 U/L — ABNORMAL HIGH (ref 0–44)
AST: 65 U/L — ABNORMAL HIGH (ref 15–41)
Albumin: 2.7 g/dL — ABNORMAL LOW (ref 3.5–5.0)
Alkaline Phosphatase: 94 U/L (ref 38–126)
Anion gap: 7 (ref 5–15)
BUN: 74 mg/dL — ABNORMAL HIGH (ref 8–23)
CO2: 22 mmol/L (ref 22–32)
Calcium: 9.1 mg/dL (ref 8.9–10.3)
Chloride: 109 mmol/L (ref 98–111)
Creatinine, Ser: 3.07 mg/dL — ABNORMAL HIGH (ref 0.61–1.24)
GFR, Estimated: 19 mL/min — ABNORMAL LOW (ref >60)
Glucose, Bld: 131 mg/dL — ABNORMAL HIGH (ref 70–99)
Potassium: 4.3 mmol/L (ref 3.5–5.1)
Sodium: 138 mmol/L (ref 135–145)
Total Bilirubin: 0.5 mg/dL (ref 0.0–1.2)
Total Protein: 6.1 g/dL — ABNORMAL LOW (ref 6.5–8.1)

## 2023-03-10 LAB — CBC
HCT: 30.1 % — ABNORMAL LOW (ref 39.0–52.0)
Hemoglobin: 9.1 g/dL — ABNORMAL LOW (ref 13.0–17.0)
MCH: 31.7 pg (ref 26.0–34.0)
MCHC: 30.2 g/dL (ref 30.0–36.0)
MCV: 104.9 fL — ABNORMAL HIGH (ref 80.0–100.0)
Platelets: 96 10*3/uL — ABNORMAL LOW (ref 150–400)
RBC: 2.87 MIL/uL — ABNORMAL LOW (ref 4.22–5.81)
RDW: 13.2 % (ref 11.5–15.5)
WBC: 5.3 10*3/uL (ref 4.0–10.5)
nRBC: 0 % (ref 0.0–0.2)

## 2023-03-10 LAB — LIPOPROTEIN A (LPA): Lipoprotein (a): 26.1 nmol/L (ref <75.0)

## 2023-03-10 LAB — MAGNESIUM: Magnesium: 2.2 mg/dL (ref 1.7–2.4)

## 2023-03-10 MED ORDER — GLYCERIN (LAXATIVE) 2.1 G RE SUPP
1.0000 | Freq: Once | RECTAL | Status: DC
  Filled 2023-03-10 (×2): qty 1

## 2023-03-10 NOTE — Progress Notes (Signed)
Progress Note  Patient Name: Jordan Oconnell Date of Encounter: 03/10/2023  Primary Cardiologist: Thurmon Fair, MD   Subjective   Patient seen examined by his bedside.  He tells me he is doing so much better.   Inpatient Medications    Scheduled Meds:  allopurinol  100 mg Oral Daily   ALPRAZolam  0.25 mg Oral QHS   amiodarone  200 mg Oral Daily   amLODipine  10 mg Oral Daily   apixaban  2.5 mg Oral BID   aspirin EC  81 mg Oral Daily   atorvastatin  10 mg Oral Daily   carvedilol  12.5 mg Oral BID   Chlorhexidine Gluconate Cloth  6 each Topical Daily   vitamin B-12  1,000 mcg Oral Daily   Glycerin (Adult)  1 suppository Rectal Once   hydrALAZINE  25 mg Oral BID   isosorbide mononitrate  30 mg Oral Daily   levothyroxine  75 mcg Oral Q0600   mirtazapine  7.5 mg Oral QHS   polyethylene glycol  17 g Oral Daily   sodium bicarbonate  1,300 mg Oral TID   sodium chloride flush  3 mL Intravenous Q12H   Continuous Infusions:  cefTRIAXone (ROCEPHIN)  IV 2 g (03/10/23 0851)   DAPTOmycin Stopped (03/09/23 1402)   PRN Meds: acetaminophen **OR** acetaminophen, bisacodyl, HYDROmorphone (DILAUDID) injection, nitroGLYCERIN, ondansetron **OR** ondansetron (ZOFRAN) IV, oxyCODONE, senna-docusate, sodium chloride flush   Vital Signs    Vitals:   03/09/23 2200 03/09/23 2253 03/10/23 0339 03/10/23 0759  BP: (!) 127/50 (!) 118/57 (!) 142/59 (!) 148/67  Pulse: 60 (!) 59 60 60  Resp: (!) 23 14 15 18   Temp:  98.1 F (36.7 C) 97.6 F (36.4 C) 98.5 F (36.9 C)  TempSrc:  Oral Oral Oral  SpO2: 94% 96% 96% 96%  Weight:      Height:        Intake/Output Summary (Last 24 hours) at 03/10/2023 1000 Last data filed at 03/10/2023 0700 Gross per 24 hour  Intake 200 ml  Output 3100 ml  Net -2900 ml   Filed Weights   03/07/23 0500 03/08/23 0431 03/09/23 0250  Weight: 89.8 kg 86.8 kg 86.4 kg    Telemetry    AV paced rhythm- Personally Reviewed  ECG    None today, yesterday AV  paced- Personally Reviewed  Physical Exam    General: Comfortable, sitting up in a chair Head: Atraumatic, normal size  Eyes: PEERLA, EOMI  Neck: Supple, normal JVD Cardiac: Normal S1, S2; RRR; no murmurs, rubs, or gallops Lungs: Clear to auscultation bilaterally Abd: Soft, nontender, no hepatomegaly  Ext: warm, no edema Musculoskeletal: No deformities, BUE and BLE strength normal and equal Skin: Warm and dry, no rashes   Neuro: Alert and oriented to person, place, time, and situation, CNII-XII grossly intact, no focal deficits  Psych: Normal mood and affect   Labs    Chemistry Recent Labs  Lab 03/07/23 0943 03/08/23 0628 03/10/23 0317  NA 136 135 138  K 4.2 4.6 4.3  CL 107 108 109  CO2 19* 20* 22  GLUCOSE 156* 99 131*  BUN 64* 69* 74*  CREATININE 2.58* 2.64* 3.07*  CALCIUM 8.9 8.8* 9.1  PROT  --   --  6.1*  ALBUMIN  --   --  2.7*  AST  --   --  65*  ALT  --   --  86*  ALKPHOS  --   --  94  BILITOT  --   --  0.5  GFRNONAA 24* 23* 19*  ANIONGAP 10 7 7      Hematology Recent Labs  Lab 03/07/23 0500 03/08/23 0628 03/10/23 0317  WBC 4.0 3.7* 5.3  RBC 3.18* 2.97* 2.87*  HGB 10.2* 9.3* 9.1*  HCT 32.6* 30.5* 30.1*  MCV 102.5* 102.7* 104.9*  MCH 32.1 31.3 31.7  MCHC 31.3 30.5 30.2  RDW 13.3 13.3 13.2  PLT 64* 74* 96*    Cardiac EnzymesNo results for input(s): "TROPONINI" in the last 168 hours. No results for input(s): "TROPIPOC" in the last 168 hours.   BNP Recent Labs  Lab 03/06/23 0939  BNP 151.0*     DDimer No results for input(s): "DDIMER" in the last 168 hours.   Radiology    No results found.   Cardiac Studies   Reviewed echo from yesterday  Patient Profile     83 y.o. male coronary artery disease, paroxysmal atrial fibrillation, status post TAVR valve, chronic combined diastolic systolic heart failure  Assessment & Plan    NSTEMI-Continue Aspirin 81 mg daily, continue lipitor  Known CKD seems like creatinine is closer to baseline  we will continue to monitor.  Blood pressure with one elevated reading for now continue on current regimen.   Paroxysmal atrial fibrillation/further with history of tachybradycardia syndrome he is status post pacemaker implantation-he is currently AV paced.  Continue his carvedilol, amiodarone as well as heparin drip for now.  Continue Eliquis  Will sign off at this time.   For questions or updates, please contact [facility] Please consult [email or web address] for contact info under Cardiology/STEMI.      Signed, Johanny Segers, DO  03/10/2023, 10:00 AM

## 2023-03-10 NOTE — Progress Notes (Signed)
PROGRESS NOTE  Jordan Oconnell:[id number] DOB: 12-06-1940 DOA: 03/01/2023 PCP: Geoffry Paradise, MD   LOS: 9 days   Brief Narrative / Interim history: 83 year old with past medical history significant for PAF on Eliquis, aortic stenosis status post TAVR, tachybradycardia syndrome, status post pacemaker, CKD stage IV, urothelial cancer status post multiple TURP with urostomy in place, RCC status post left nephrectomy with recurrent urothelial disease to solitary right renal pelvis, hypothyroidism who was a direct admission for management of osteomyelitis of the fourth toe.  Patient directly admitted per Dr. Odis Hollingshead for toe amputation.  Hospital course complicated by chest pain to/9, developed non-STEMI, troponin was elevated, cardiology consulted  Subjective / 24h Interval events: Overall feeling better.  Reports some constipation.  No chest pain, no shortness of breath  Assesement and Plan: Principal Problem:   Osteomyelitis of fourth toe of right foot (HCC) Active Problems:   Urothelial cancer (HCC)   PAF (paroxysmal atrial fibrillation) (HCC)   CKD (chronic kidney disease) stage 4, GFR 15-29 ml/min (HCC)   S/P TAVR (transcatheter aortic valve replacement)   Chronic combined systolic and diastolic CHF (congestive heart failure) (HCC)   Hypothyroidism   Non-ST elevation (NSTEMI) myocardial infarction [facility])   Primary hypertension   Principal problem Angina, NSTEMI -Patient develops chest pressure 2/9, with troponin elevation into the 400 range.  Cardiology consulted and followed patient while hospitalized.  He was placed on heparin infusion, transferred to stepdown and plans were in place for patient to undergo cardiac catheterization, however after discussions between the patient and the cardiology he declined to undergo cardiac cath and will be managed medically.  Underwent a 2D echo which showed LVEF 45-50%, no obvious wall motion abnormalities, grade 1 diastolic  dysfunction. -He is now chest pain-free, has completed heparin as a medical treatment for non-STEMI and transition back to Eliquis -Dispo planning underway, PT recommends CIR however has been declined.  May need SNF  Active problems Osteomyelitis of the Right fourth toe -Presented as a direct admission per orthopedic surgery. Underwent right fourth toe disarticulation at metatarsophalangeal joint.  ID consulted as well, recommend 6 weeks of IV antibiotics with ceftriaxone and Cubicin.  He had a central line placed 2/7 per IR.  WBAT operative extremity in postop shoe, elevation recommended.   Acute kidney injury on CKD  Stage IV -baseline creatinine seems to be around 2.4-2.7, it was up to 3.7 this hospitalization, varying to some extent today, 3.0.  No changes in urination  Urothelial cancer s/p urostomy and RCC s/p left nephrectomy with recurrent disease to solitary right kidney -Has multifocal urothelial disease, follows with [facility] oncology and nephrology -On active surveillance after most recent treatment being radiation therapy in 12 months of pembrolizumab -Continue with urostomy care   Paroxysmal atrial fibrillation/flutter, tachy-brady syndrome s/p PPM -Continue Coreg, amiodarone, Eliquis   Hypertension - Started Norvasc. Monitor BP  Chronic combined systolic and diastolic CHF - Last EF improved to 50-55% (09/22/2022) compared to prior 30-35% (06/02/2022).  Appears euvolemic.  Continue medical management   Hypothyroidism - Continue Synthroid.   Severe aortic stenosis s/p TAVR 01/2021 - Stable on last echo 09/22/2022.   Insomnia - Continue mirtazapine and Xanax 0.25 mg nightly.   Thrombocytopenia - follow trend.  Overall stable, no bleeding  B12 deficiency - Started intramuscular supplement, transition to oral supplement.    Right foot pain - Uric acid elevated, probably gout. Will start Allopurinol. Received a dose of Colchicine.    Scheduled Meds:  allopurinol  100  mg Oral  Daily   ALPRAZolam  0.25 mg Oral QHS   amiodarone  200 mg Oral Daily   amLODipine  10 mg Oral Daily   apixaban  2.5 mg Oral BID   aspirin EC  81 mg Oral Daily   atorvastatin  10 mg Oral Daily   carvedilol  12.5 mg Oral BID   Chlorhexidine Gluconate Cloth  6 each Topical Daily   vitamin B-12  1,000 mcg Oral Daily   Glycerin (Adult)  1 suppository Rectal Once   hydrALAZINE  25 mg Oral BID   isosorbide mononitrate  30 mg Oral Daily   levothyroxine  75 mcg Oral Q0600   mirtazapine  7.5 mg Oral QHS   polyethylene glycol  17 g Oral Daily   sodium bicarbonate  1,300 mg Oral TID   sodium chloride flush  3 mL Intravenous Q12H   Continuous Infusions:  cefTRIAXone (ROCEPHIN)  IV 2 g (03/10/23 0851)   DAPTOmycin Stopped (03/09/23 1402)   PRN Meds:.acetaminophen **OR** acetaminophen, bisacodyl, HYDROmorphone (DILAUDID) injection, nitroGLYCERIN, ondansetron **OR** ondansetron (ZOFRAN) IV, oxyCODONE, senna-docusate, sodium chloride flush  Current Outpatient Medications  Medication Instructions   acetaminophen (TYLENOL) 1,000-1,500 mg, 2 times daily PRN   ALPRAZolam (XANAX) 0.25 mg, Daily at bedtime   amiodarone (PACERONE) 200 mg, Oral, Daily   azithromycin (ZITHROMAX) 500 MG tablet TAKE 1 TABLET BY MOUTH ONCE DAILY 1  HOUR  BEFORE  DENTAL  PROCEDURES  AND  CLEANINGS   carvedilol (COREG) 12.5 mg, 2 times daily   cephALEXin (KEFLEX) 500 mg, Every 6 hours   Eliquis 2.5 mg, Oral, 2 times daily   ferrous sulfate 325 mg, Oral, 2 times daily   levothyroxine (SYNTHROID) 75 mcg, Daily before breakfast   mirtazapine (REMERON) 7.5 mg, Daily at bedtime   sodium bicarbonate 1,300 mg, 2 times daily   sulfamethoxazole-trimethoprim (BACTRIM) 400-80 MG tablet 1 tablet, 2 times daily    Diet Orders (From admission, onward)     Start     Ordered   03/08/23 0911  Diet renal with fluid restriction Fluid restriction: 1200 mL Fluid; Room service appropriate? Yes; Fluid consistency: Thin  Diet effective now        Question Answer Comment  Fluid restriction: 1200 mL Fluid   Room service appropriate? Yes   Fluid consistency: Thin      03/08/23 0910            DVT prophylaxis: apixaban (ELIQUIS) tablet 2.5 mg Start: 03/08/23 2200 SCDs Start: 03/01/23 2313 apixaban (ELIQUIS) tablet 2.5 mg   Lab Results  Component Value Date   PLT 96 (L) 03/10/2023      Code Status: Full Code  Family Communication: Son Braven over the phone  Status is: Inpatient  Remains inpatient appropriate because:   Level of care: Telemetry  Consultants:  ID Cards  Objective: Vitals:   03/09/23 2200 03/09/23 2253 03/10/23 0339 03/10/23 0759  BP: (!) 127/50 (!) 118/57 (!) 142/59 (!) 148/67  Pulse: 60 (!) 59 60 60  Resp: (!) 23 14 15 18   Temp:  98.1 F (36.7 C) 97.6 F (36.4 C) 98.5 F (36.9 C)  TempSrc:  Oral Oral Oral  SpO2: 94% 96% 96% 96%  Weight:      Height:        Intake/Output Summary (Last 24 hours) at 03/10/2023 1124 Last data filed at 03/10/2023 0700 Gross per 24 hour  Intake 200 ml  Output 2300 ml  Net -2100 ml   Wt Readings from  Last 3 Encounters:  03/09/23 86.4 kg  10/06/22 87.5 kg  06/14/22 80.9 kg    Examination:  Constitutional: NAD Eyes: lids and conjunctivae normal, no scleral icterus ENMT: mmm Neck: normal, supple Respiratory: clear to auscultation bilaterally, no wheezing, no crackles. Normal respiratory effort.  Cardiovascular: Regular rate and rhythm, no murmurs / rubs / gallops. No LE edema. Abdomen: soft, no distention, no tenderness. Bowel sounds positive.   Data Reviewed: I have independently reviewed following labs and imaging studies   CBC Recent Labs  Lab 03/04/23 0807 03/06/23 0309 03/07/23 0500 03/08/23 0628 03/10/23 0317  WBC 4.1 4.8 4.0 3.7* 5.3  HGB 11.8* 10.7* 10.2* 9.3* 9.1*  HCT 38.9* 34.5* 32.6* 30.5* 30.1*  PLT 69* 66* 64* 74* 96*  MCV 105.1* 103.9* 102.5* 102.7* 104.9*  MCH 31.9 32.2 32.1 31.3 31.7  MCHC 30.3 31.0 31.3 30.5 30.2   RDW 14.1 13.8 13.3 13.3 13.2    Recent Labs  Lab 03/04/23 0534 03/04/23 0807 03/06/23 0309 03/06/23 0939 03/07/23 0943 03/08/23 0628 03/10/23 0317  NA  --  139 139  --  136 135 138  K  --  4.9 4.5  --  4.2 4.6 4.3  CL  --  111 109  --  107 108 109  CO2  --  20* 21*  --  19* 20* 22  GLUCOSE  --  103* 118*  --  156* 99 131*  BUN  --  46* 55*  --  64* 69* 74*  CREATININE  --  2.73* 2.77*  --  2.58* 2.64* 3.07*  CALCIUM  --  9.4 8.9  --  8.9 8.8* 9.1  AST  --   --   --   --   --   --  65*  ALT  --   --   --   --   --   --  86*  ALKPHOS  --   --   --   --   --   --  94  BILITOT  --   --   --   --   --   --  0.5  ALBUMIN  --   --   --   --   --   --  2.7*  MG  --   --   --  2.1  --   --  2.2  CRP 4.4*  --   --   --   --   --   --   BNP  --   --   --  151.0*  --   --   --     ------------------------------------------------------------------------------------------------------------------ Recent Labs    03/08/23 0427  CHOL 82  HDL 34*  LDLCALC 34  TRIG 69  CHOLHDL 2.4    Lab Results  Component Value Date   HGBA1C 4.9 03/12/2022   ------------------------------------------------------------------------------------------------------------------ No results for input(s): "TSH", "T4TOTAL", "T3FREE", "THYROIDAB" in the last 72 hours.  Invalid input(s): "FREET3"  Cardiac Enzymes No results for input(s): "CKMB", "TROPONINI", "MYOGLOBIN" in the last 168 hours.  Invalid input(s): "CK" ------------------------------------------------------------------------------------------------------------------    Component Value Date/Time   BNP 151.0 (H) 03/06/2023 0939    CBG: No results for input(s): "GLUCAP" in the last 168 hours.  Recent Results (from the past 240 hours)  Surgical PCR screen     Status: None   Collection Time: 03/02/23 12:53 AM   Specimen: Nasal Mucosa; Nasal Swab  Result Value Ref Range Status   MRSA, PCR NEGATIVE NEGATIVE  Final   Staphylococcus aureus  NEGATIVE NEGATIVE Final    Comment: (NOTE) The Xpert SA Assay (FDA approved for NASAL specimens in patients 9 years of age and older), is one component of a comprehensive surveillance program. It is not intended to diagnose infection nor to guide or monitor treatment. Performed at [facility], [address] [place], [place] [address]   Aerobic/Anaerobic Culture w Gram Stain (surgical/deep wound)     Status: None   Collection Time: 03/02/23  4:37 PM   Specimen: Toe, Right; Amputation  Result Value Ref Range Status   Specimen Description   Final    TOE RIGHT 4TH TOE Performed at [facility], [address] [place], [place] [address]    Special Requests   Final    NONE Performed at [facility], [address] [place], [place] [address]    Gram Stain NO WBC SEEN NO ORGANISMS SEEN   Final   Culture   Final    No growth aerobically or anaerobically. Performed at [facility], [address] [place], [place] [address]    Report Status 03/07/2023 FINAL  Final     Radiology Studies: No results found.   Pamella Pert, MD, PhD [facility]  Between 7 am - 7 pm I am available, please contact me via Amion (for emergencies) or Securechat (non urgent messages)  Between 7 pm - 7 am I am not available, please contact night coverage MD/APP via Amion

## 2023-03-10 NOTE — TOC Progression Note (Signed)
Transition of Care [facility]) - Progression Note   Patient Details  Name: Jordan Oconnell MRN: [id number] Date of Birth: 07/19/1940  Transition of Care [facility]) CM/SW Contact  Ewing Schlein, LCSW Phone Number: 03/10/2023, 3:44 PM  Clinical Narrative: Patient does not meet criteria for IP rehab at this time, so SNF is recommended. CSW met with patient to discuss SNF. Patient politely declined SNF at this time and prefers to discharge home with [facility] instead of going to a facility. Patient requested that CSW follow up with his son, [name]. CSW updated son regarding patient's preference to go home. CSW spoke with Pam with [employer] to provide an update.  Expected Discharge Plan and Services Discharge Planning Services: NA Living arrangements for the past 2 months: Single Family Home HH Arranged: RN, PT, OT [facility] Agency: [facility] Date [facility] Agency Contacted: 03/08/23 Time HH Agency Contacted: 1237 Representative spoke with at [facility] Agency: Cindie  Social Determinants of Health (SDOH) Interventions SDOH Screenings   Food Insecurity: No Food Insecurity (03/01/2023)  Housing: Low Risk  (03/02/2023)  Transportation Needs: No Transportation Needs (03/01/2023)  Utilities: Not At Risk (03/01/2023)  Social Connections: Unknown (03/01/2023)  Tobacco Use: Low Risk  (03/02/2023)   Readmission Risk Interventions    03/08/2023   12:36 PM  Readmission Risk Prevention Plan  Transportation Screening Complete  PCP or Specialist Appt within 3-5 Days Complete  HRI or Home Care Consult Complete  Social Work Consult for Recovery Care Planning/Counseling Complete  Palliative Care Screening Not Applicable  Medication Review [employer]) Complete

## 2023-03-11 ENCOUNTER — Inpatient Hospital Stay (HOSPITAL_COMMUNITY): Payer: Medicare PPO

## 2023-03-11 ENCOUNTER — Telehealth: Payer: Self-pay | Admitting: Cardiovascular Disease

## 2023-03-11 DIAGNOSIS — M869 Osteomyelitis, unspecified: Secondary | ICD-10-CM | POA: Diagnosis not present

## 2023-03-11 LAB — COMPREHENSIVE METABOLIC PANEL
ALT: 83 U/L — ABNORMAL HIGH (ref 0–44)
AST: 59 U/L — ABNORMAL HIGH (ref 15–41)
Albumin: 2.9 g/dL — ABNORMAL LOW (ref 3.5–5.0)
Alkaline Phosphatase: 98 U/L (ref 38–126)
Anion gap: 9 (ref 5–15)
BUN: 72 mg/dL — ABNORMAL HIGH (ref 8–23)
CO2: 21 mmol/L — ABNORMAL LOW (ref 22–32)
Calcium: 9.3 mg/dL (ref 8.9–10.3)
Chloride: 110 mmol/L (ref 98–111)
Creatinine, Ser: 2.61 mg/dL — ABNORMAL HIGH (ref 0.61–1.24)
GFR, Estimated: 24 mL/min — ABNORMAL LOW (ref >60)
Glucose, Bld: 90 mg/dL (ref 70–99)
Potassium: 4.6 mmol/L (ref 3.5–5.1)
Sodium: 140 mmol/L (ref 135–145)
Total Bilirubin: 0.4 mg/dL (ref 0.0–1.2)
Total Protein: 5.9 g/dL — ABNORMAL LOW (ref 6.5–8.1)

## 2023-03-11 LAB — CBC
HCT: 29.3 % — ABNORMAL LOW (ref 39.0–52.0)
Hemoglobin: 9.1 g/dL — ABNORMAL LOW (ref 13.0–17.0)
MCH: 31.9 pg (ref 26.0–34.0)
MCHC: 31.1 g/dL (ref 30.0–36.0)
MCV: 102.8 fL — ABNORMAL HIGH (ref 80.0–100.0)
Platelets: 107 10*3/uL — ABNORMAL LOW (ref 150–400)
RBC: 2.85 MIL/uL — ABNORMAL LOW (ref 4.22–5.81)
RDW: 13.2 % (ref 11.5–15.5)
WBC: 4.6 10*3/uL (ref 4.0–10.5)
nRBC: 0 % (ref 0.0–0.2)

## 2023-03-11 LAB — CK: Total CK: 85 U/L (ref 49–397)

## 2023-03-11 LAB — MAGNESIUM: Magnesium: 2.1 mg/dL (ref 1.7–2.4)

## 2023-03-11 MED ORDER — CEFTRIAXONE IV (FOR PTA / DISCHARGE USE ONLY): 2.0000 g | INTRAVENOUS | 0 refills | Status: AC

## 2023-03-11 MED ORDER — DAPTOMYCIN IV (FOR PTA / DISCHARGE USE ONLY): 700.0000 mg | INTRAVENOUS | 0 refills | Status: AC

## 2023-03-11 NOTE — Progress Notes (Signed)
Occupational Therapy Treatment Patient Details Name: NEVADA MULLETT MRN: [id number] DOB: Mar 29, 1940 Today's Date: 03/11/2023   History of present illness 83 year old with past medical history significant for PAF on Eliquis, aortic stenosis status post TAVR, tachybradycardia syndrome, status post pacemaker, CKD stage IV, urothelial cancer status post multiple TURP with urostomy in place, RCC status post left nephrectomy with recurrent urothelial disease to solitary right renal pelvis, hypothyroidism who was a direct admission for management of osteomyelitis of the fourth toe.  Patient directly admitted per Dr. Odis Hollingshead for toe amputation   OT comments  Pt making good progress with functional goals with improved activity tolerance and decreased assist required with ADL tasks. Pt eager to d/c home over the weekend.      If plan is discharge home, recommend the following:  A little help with walking and/or transfers;Assistance with cooking/housework;Assist for transportation;Help with stairs or ramp for entrance;A little help with bathing/dressing/bathroom   Equipment Recommendations  None recommended by OT    Recommendations for Other Services      Precautions / Restrictions Precautions Precautions: Fall Precaution/Restrictions Comments: WBAT in post-op shoe Required Braces or Orthoses: Other Brace Other Brace: Post-op shoe Restrictions Weight Bearing Restrictions Per Provider Order: Yes RLE Weight Bearing Per Provider Order: Weight bearing as tolerated Other Position/Activity Restrictions: with R post op shoe       Mobility Bed Mobility Overal bed mobility: Needs Assistance Bed Mobility: Supine to Sit, Sit to Supine     Supine to sit: Supervision Sit to supine: Supervision        Transfers Overall transfer level: Needs assistance Equipment used: Rolling walker (2 wheels) Transfers: Sit to/from Stand Sit to Stand: Supervision                 Balance Overall  balance assessment: Needs assistance Sitting-balance support: Feet supported Sitting balance-Leahy Scale: Good     Standing balance support: Bilateral upper extremity supported, During functional activity, Reliant on assistive device for balance Standing balance-Leahy Scale: Fair                             ADL either performed or assessed with clinical judgement   ADL Overall ADL's : Needs assistance/impaired Eating/Feeding: Independent   Grooming: Wash/dry hands;Wash/dry face;Supervision/safety;Standing   Upper Body Bathing: Set up   Lower Body Bathing: Contact guard assist;Supervison/ safety   Upper Body Dressing : Set up   Lower Body Dressing: Contact guard assist;Supervision/safety   Toilet Transfer: Supervision/safety;Ambulation;Rolling walker (2 wheels);Regular Toilet;Grab bars   Toileting- Clothing Manipulation and Hygiene: Supervision/safety;Sit to/from stand       Functional mobility during ADLs: Supervision/safety;Rolling walker (2 wheels)      Extremity/Trunk Assessment Upper Extremity Assessment Upper Extremity Assessment: Overall WFL for tasks assessed   Lower Extremity Assessment Lower Extremity Assessment: Defer to PT evaluation   Cervical / Trunk Assessment Cervical / Trunk Assessment: Normal    Vision Baseline Vision/History: 0 No visual deficits Ability to See in Adequate Light: 0 Adequate Patient Visual Report: No change from baseline     Perception     Praxis     Communication Communication Factors Affecting Communication: Hearing impaired   Cognition Arousal: Alert Behavior During Therapy: WFL for tasks assessed/performed Cognition: No apparent impairments                               Following commands: Intact  Cueing      Exercises      Shoulder Instructions       General Comments      Pertinent Vitals/ Pain       Pain Assessment Pain Assessment: No/denies pain Pain Score: 0-No  pain Pain Intervention(s): Monitored during session, Repositioned  Home Living                                          Prior Functioning/Environment              Frequency  Min 1X/week        Progress Toward Goals  OT Goals(current goals can now be found in the care plan section)  Progress towards OT goals: Progressing toward goals     Plan      Co-evaluation                 AM-PAC OT "6 Clicks" Daily Activity     Outcome Measure   Help from another person eating meals?: None Help from another person taking care of personal grooming?: A Little Help from another person toileting, which includes using toliet, bedpan, or urinal?: A Little Help from another person bathing (including washing, rinsing, drying)?: A Little Help from another person to put on and taking off regular upper body clothing?: A Little Help from another person to put on and taking off regular lower body clothing?: A Little 6 Click Score: 19    End of Session Equipment Utilized During Treatment: Gait belt;Rolling walker (2 wheels)  OT Visit Diagnosis: Unsteadiness on feet (R26.81);Other abnormalities of gait and mobility (R26.89);Pain   Activity Tolerance Patient tolerated treatment well   Patient Left with call bell/phone within reach;in bed;Other (comment) (PT in to see pt)   Nurse Communication          Time: 5784-6962 OT Time Calculation (min): 24 min  Charges: OT General Charges $OT Visit: 1 Visit OT Treatments $Self Care/Home Management : 8-22 mins $Therapeutic Activity: 8-22 mins    Galen Manila 03/11/2023, 2:03 PM

## 2023-03-11 NOTE — Telephone Encounter (Signed)
Spoke to patient stated he is currently at [facility].Stated he had to have a toe amputation.He had chest pressure while in hospital.He is suppose to be discharged tomorrow.He wants to know is it ok to have dental work done [employer] 2/18. He has a cavity. Advised he probably needs to reschedule dental appointment.He should wait until after his post hospital visit to reschedule dental appointment.I will send message to Dr.Croitoru.

## 2023-03-11 NOTE — Progress Notes (Signed)
Inpatient Rehabilitation Admissions Coordinator   Per patient request per Dr Lafe Garin, I spoke with patient by phone to explain my assessment on 2/12 of his therapy. On 2/12, patient was up with therapy and fatigued very easily and my screening assessment determined that he was unable to tolerate the intensity of CIR level rehab. Patient reports that yesterday he was ambulating down the hall with nursing assistance and his RW and was about independent. He is walking to the bathroom with nursing and feels much better now that his bowel situation is resolved. At this level, patient now likely has surpassed the need for CIR level intensity as well as lack the medical need for continued inhospital rehab. He asks that case manager contact his son who is coordinating his home setup to return home. I will alert acute team and TOC.  Ottie Glazier, RN, MSN Rehab Admissions Coordinator [phone] 03/11/2023 8:37 AM

## 2023-03-11 NOTE — Telephone Encounter (Signed)
Patient stated he is being released from [facility] and is due to have dental work done on Tuesday, 2/18.  Patient stated he has antibiotic medication but wants to know if he should still have the dental work done after being released from the hospital.

## 2023-03-11 NOTE — Progress Notes (Signed)
Physical Therapy Treatment Patient Details Name: Jordan Oconnell MRN: [id number] DOB: 1940/11/08 Today's Date: 03/11/2023   History of Present Illness 83 year old with past medical history significant for PAF on Eliquis, aortic stenosis status post TAVR, tachybradycardia syndrome, status post pacemaker, CKD stage IV, urothelial cancer status post multiple TURP with urostomy in place, RCC status post left nephrectomy with recurrent urothelial disease to solitary right renal pelvis, hypothyroidism who was a direct admission for management of osteomyelitis of the fourth toe.  Patient directly admitted per Dr. Odis Hollingshead for toe amputation. 2/9 pt reported chest pressure and improved with nitroglycerin with elevated troponin and new dx of NSTEMI. Pt declined cardiac cath on 2/11. Pt has transitioned out of ICU as of 2/12.    PT Comments   Pt admitted with above diagnosis.  Pt currently with functional limitations due to the deficits listed below (see PT Problem List). Pt had just completed OT intervention, however agreeable to PT. Pt performed bed mobility mod I, transfers S with min cues and use of RW, gait tasks in hallway with RW, S, min cues, L standard shoe donned and R post op. Step navigation instruction with B handrail x 3 steps cues for step to pattern vs pt elected reciprocal pattern. Pt required cues for R LE WB through heel with gait and step navigation today. Pt reports feeling better and demonstrates improved activity tolerance and IND with functional mobility tasks and will benefit from [facility] services at time of d/c.  Pt will benefit from acute skilled PT to increase their independence and safety with mobility to allow discharge.      If plan is discharge home, recommend the following: A little help with walking and/or transfers;A little help with bathing/dressing/bathroom;Assistance with cooking/housework;Assist for transportation;Help with stairs or ramp for entrance   Can travel by private  vehicle        Equipment Recommendations  Rolling walker (2 wheels)    Recommendations for Other Services       Precautions / Restrictions Precautions Precautions: Fall Precaution/Restrictions Comments: WBAT in post-op shoe Required Braces or Orthoses: Other Brace Other Brace: Post-op shoe Restrictions Weight Bearing Restrictions Per Provider Order: Yes RLE Weight Bearing Per Provider Order: Weight bearing as tolerated Other Position/Activity Restrictions: with R post op shoe     Mobility  Bed Mobility Overal bed mobility: Needs Assistance Bed Mobility: Supine to Sit, Sit to Supine     Supine to sit: Supervision Sit to supine: Supervision   General bed mobility comments: S and min cues    Transfers Overall transfer level: Needs assistance Equipment used: Rolling walker (2 wheels) Transfers: Sit to/from Stand Sit to Stand: Supervision           General transfer comment: S and min cues for UE and AD placement, A to don L shoe, R post op shoe donned    Ambulation/Gait Ambulation/Gait assistance: Supervision Gait Distance (Feet): 500 Feet Assistive device: Rolling walker (2 wheels) Gait Pattern/deviations: Step-to pattern Gait velocity: slightly decreased     General Gait Details: step almost through pattern, pt encouraged to WB through R heel with post op shoe donned, min cues for safety and posture   Stairs Stairs: Yes Stairs assistance: Contact guard assist Stair Management: Two rails Number of Stairs: 3 General stair comments: cues for safety and pt encoruaged to perform step to pattern to ensure WB through R heel, pt elected a reciprocal pattern   Wheelchair Mobility     Tilt Bed    Modified  Rankin (Stroke Patients Only)       Balance Overall balance assessment: Needs assistance Sitting-balance support: Feet supported Sitting balance-Leahy Scale: Good     Standing balance support: Bilateral upper extremity supported, During functional  activity, Reliant on assistive device for balance Standing balance-Leahy Scale: Fair Standing balance comment: static standing no UE support                            Communication Communication Factors Affecting Communication: Hearing impaired  Cognition Arousal: Alert Behavior During Therapy: WFL for tasks assessed/performed   PT - Cognitive impairments: No apparent impairments                       PT - Cognition Comments: less impusive as compared to eval Following commands: Intact      Cueing    Exercises      General Comments        Pertinent Vitals/Pain Pain Assessment Pain Assessment: No/denies pain    Home Living                          Prior Function            PT Goals (current goals can now be found in the care plan section) Acute Rehab PT Goals Patient Stated Goal: to get stronger and return to IND and walking program PT Goal Formulation: With patient Time For Goal Achievement: 03/18/23 Potential to Achieve Goals: Good Progress towards PT goals: Progressing toward goals    Frequency    Min 1X/week      PT Plan      Co-evaluation              AM-PAC PT "6 Clicks" Mobility   Outcome Measure  Help needed turning from your back to your side while in a flat bed without using bedrails?: None Help needed moving from lying on your back to sitting on the side of a flat bed without using bedrails?: None Help needed moving to and from a bed to a chair (including a wheelchair)?: A Little Help needed standing up from a chair using your arms (e.g., wheelchair or bedside chair)?: A Little Help needed to walk in hospital room?: A Little Help needed climbing 3-5 steps with a railing? : A Little 6 Click Score: 20    End of Session Equipment Utilized During Treatment: Gait belt Activity Tolerance: Patient tolerated treatment well Patient left: with call bell/phone within reach;in bed Nurse Communication: Mobility  status;Weight bearing status PT Visit Diagnosis: Unsteadiness on feet (R26.81);Other abnormalities of gait and mobility (R26.89);Muscle weakness (generalized) (M62.81);Difficulty in walking, not elsewhere classified (R26.2);Pain Pain - Right/Left: Right Pain - part of body: Ankle and joints of foot     Time: 2130-8657 PT Time Calculation (min) (ACUTE ONLY): 25 min  Charges:    $Gait Training: 8-22 mins $Therapeutic Activity: 8-22 mins PT General Charges $$ ACUTE PT VISIT: 1 Visit                     Johnny Bridge, PT Acute Rehab    Jacqualyn Posey 03/11/2023, 12:33 PM

## 2023-03-11 NOTE — Progress Notes (Signed)
PROGRESS NOTE  Jordan Oconnell BJY:[id number] DOB: 28-May-1940 DOA: 03/01/2023 PCP: Jordan Paradise, MD   LOS: 10 days   Brief Narrative / Interim history: 83 year old with past medical history significant for PAF on Eliquis, aortic stenosis status post TAVR, tachybradycardia syndrome, status post pacemaker, CKD stage IV, urothelial cancer status post multiple TURP with urostomy in place, RCC status post left nephrectomy with recurrent urothelial disease to solitary right renal pelvis, hypothyroidism who was a direct admission for management of osteomyelitis of the fourth toe.  Patient directly admitted per Dr. Odis Oconnell for toe amputation.  Hospital course complicated by chest pain to/9, developed non-STEMI, troponin was elevated, cardiology consulted  Subjective / 24h Interval events: Feeling like he is getting stronger.  No chest pain, no shortness of breath.  Assesement and Plan: Principal Problem:   Osteomyelitis of fourth toe of right foot (HCC) Active Problems:   Urothelial cancer (HCC)   PAF (paroxysmal atrial fibrillation) (HCC)   CKD (chronic kidney disease) stage 4, GFR 15-29 ml/min (HCC)   S/P TAVR (transcatheter aortic valve replacement)   Chronic combined systolic and diastolic CHF (congestive heart failure) (HCC)   Hypothyroidism   Non-ST elevation (NSTEMI) myocardial infarction [name]   Primary hypertension   Principal problem Angina, NSTEMI -Patient develops chest pressure 2/9, with troponin elevation into the 400 range.  Cardiology consulted and followed patient while hospitalized.  He was placed on heparin infusion, transferred to stepdown and plans were in place for patient to undergo cardiac catheterization, however after discussions between the patient and the cardiology he declined to undergo cardiac cath and will be managed medically.  Underwent a 2D echo which showed LVEF 45-50%, no obvious wall motion abnormalities, grade 1 diastolic dysfunction. -He is now  chest pain-free, has completed heparin as a medical treatment for non-STEMI and transition back to Eliquis -Dispo planning underway, PT recommends CIR however has been declined.  Improving to the point that does not seem to be needing SNF.  Likely home this weekend  Active problems Osteomyelitis of the Right fourth toe -Presented as a direct admission per orthopedic surgery. Underwent right fourth toe disarticulation at metatarsophalangeal joint.  ID consulted as well, recommend 6 weeks of IV antibiotics with ceftriaxone and Cubicin.  He had a central line placed 2/7 per IR.  WBAT operative extremity in postop shoe, elevation recommended. -TOC consulted for home health   Acute kidney injury on CKD  Stage IV -baseline creatinine seems to be around 2.4-2.7, it was up to 3.7 this hospitalization, varying on a daily basis.  Overall stable, currently at 2.6  Urothelial cancer s/p urostomy and RCC s/p left nephrectomy with recurrent disease to solitary right kidney -Has multifocal urothelial disease, follows with [facility] oncology and nephrology -On active surveillance after most recent treatment being radiation therapy in 12 months of pembrolizumab -Continue with urostomy care   Paroxysmal atrial fibrillation/flutter, tachy-brady syndrome s/p PPM -Continue Coreg, amiodarone, Eliquis   Hypertension - Started Norvasc. Monitor BP  Chronic combined systolic and diastolic CHF - Last EF improved to 50-55% (09/22/2022) compared to prior 30-35% (06/02/2022).  Appears euvolemic.  Continue medical management   Hypothyroidism - Continue Synthroid.   Severe aortic stenosis s/p TAVR 01/2021 - Stable on last echo 09/22/2022.   Insomnia - Continue mirtazapine and Xanax 0.25 mg nightly.   Thrombocytopenia - follow trend.  Overall stable, no bleeding, platelets improving today  B12 deficiency - Started intramuscular supplement, transition to oral supplement.    Right foot pain - Uric  acid elevated, probably  gout. Will start Allopurinol. Received a dose of Colchicine.   Right knee swelling-very mild if any tenderness on the lateral aspect, obtain an x-ray  Scheduled Meds:  allopurinol  100 mg Oral Daily   ALPRAZolam  0.25 mg Oral QHS   amiodarone  200 mg Oral Daily   amLODipine  10 mg Oral Daily   apixaban  2.5 mg Oral BID   aspirin EC  81 mg Oral Daily   atorvastatin  10 mg Oral Daily   carvedilol  12.5 mg Oral BID   Chlorhexidine Gluconate Cloth  6 each Topical Daily   vitamin B-12  1,000 mcg Oral Daily   Glycerin (Adult)  1 suppository Rectal Once   hydrALAZINE  25 mg Oral BID   isosorbide mononitrate  30 mg Oral Daily   levothyroxine  75 mcg Oral Q0600   mirtazapine  7.5 mg Oral QHS   polyethylene glycol  17 g Oral Daily   sodium bicarbonate  1,300 mg Oral TID   sodium chloride flush  3 mL Intravenous Q12H   Continuous Infusions:  cefTRIAXone (ROCEPHIN)  IV 2 g (03/11/23 1020)   DAPTOmycin 700 mg (03/11/23 1146)   PRN Meds:.acetaminophen **OR** acetaminophen, bisacodyl, HYDROmorphone (DILAUDID) injection, nitroGLYCERIN, ondansetron **OR** ondansetron (ZOFRAN) IV, oxyCODONE, senna-docusate, sodium chloride flush  Current Outpatient Medications  Medication Instructions   acetaminophen (TYLENOL) 1,000-1,500 mg, 2 times daily PRN   ALPRAZolam (XANAX) 0.25 mg, Daily at bedtime   amiodarone (PACERONE) 200 mg, Oral, Daily   azithromycin (ZITHROMAX) 500 MG tablet TAKE 1 TABLET BY MOUTH ONCE DAILY 1  HOUR  BEFORE  DENTAL  PROCEDURES  AND  CLEANINGS   carvedilol (COREG) 12.5 mg, 2 times daily   cefTRIAXone (ROCEPHIN) IVPB 2 g, Intravenous, Every 24 hours, Indication:  osteomyelitis of right foot  First Dose: Yes Last Day of Therapy:  04/13/23 Labs - Once weekly:  CBC/D and BMP, Labs - Once weekly: ESR and CRP Method of administration: IV Push Method of administration may be changed at the discretion of home infusion pharmacist based upon assessment of the patient and/or caregiver's  ability to self-administer the medication ordered.   cephALEXin (KEFLEX) 500 mg, Every 6 hours   daptomycin (CUBICIN) IVPB 700 mg, Intravenous, Every 48 hours, Indication:  Osteomyelitis of right foot  First Dose: Yes Last Day of Therapy:  04/13/23 Labs - Once weekly:  CBC/D, BMP, and CPK Labs - Once weekly: ESR and CRP Method of administration: IV Push Method of administration may be changed at the discretion of home infusion pharmacist based upon assessment of the patient and/or caregiver's ability to self-administer the medication ordered.   Eliquis 2.5 mg, Oral, 2 times daily   ferrous sulfate 325 mg, Oral, 2 times daily   levothyroxine (SYNTHROID) 75 mcg, Daily before breakfast   mirtazapine (REMERON) 7.5 mg, Daily at bedtime   sodium bicarbonate 1,300 mg, 2 times daily   sulfamethoxazole-trimethoprim (BACTRIM) 400-80 MG tablet 1 tablet, 2 times daily    Diet Orders (From admission, onward)     Start     Ordered   03/08/23 0911  Diet renal with fluid restriction Fluid restriction: 1200 mL Fluid; Room service appropriate? Yes; Fluid consistency: Thin  Diet effective now       Question Answer Comment  Fluid restriction: 1200 mL Fluid   Room service appropriate? Yes   Fluid consistency: Thin      03/08/23 0910  DVT prophylaxis: apixaban (ELIQUIS) tablet 2.5 mg Start: 03/08/23 2200 SCDs Start: 03/01/23 2313 apixaban (ELIQUIS) tablet 2.5 mg   Lab Results  Component Value Date   PLT 107 (L) 03/11/2023      Code Status: Full Code  Family Communication: Son Sani over the phone  Status is: Inpatient  Remains inpatient appropriate because:   Level of care: Telemetry  Consultants:  ID Cards  Objective: Vitals:   03/10/23 1133 03/10/23 2239 03/11/23 0556 03/11/23 1006  BP: 136/63 (!) 133/55 (!) 145/64 (!) 133/58  Pulse: 61 (!) 59 62 61  Resp: 18 15 14 18   Temp: 97.9 F (36.6 C) (!) 97.4 F (36.3 C) 97.9 F (36.6 C) 97.7 F (36.5 C)  TempSrc:  Oral   Oral  SpO2: 94% 97% 96% 98%  Weight:      Height:        Intake/Output Summary (Last 24 hours) at 03/11/2023 1152 Last data filed at 03/11/2023 1610 Gross per 24 hour  Intake --  Output 600 ml  Net -600 ml   Wt Readings from Last 3 Encounters:  03/09/23 86.4 kg  10/06/22 87.5 kg  06/14/22 80.9 kg    Examination:  Constitutional: NAD Eyes: lids and conjunctivae normal, no scleral icterus ENMT: mmm Neck: normal, supple Respiratory: clear to auscultation bilaterally, no wheezing, no crackles. Normal respiratory effort.  Cardiovascular: Regular rate and rhythm, no murmurs / rubs / gallops. No LE edema. Abdomen: soft, no distention, no tenderness. Bowel sounds positive.   Data Reviewed: I have independently reviewed following labs and imaging studies   CBC Recent Labs  Lab 03/06/23 0309 03/07/23 0500 03/08/23 0628 03/10/23 0317 03/11/23 0341  WBC 4.8 4.0 3.7* 5.3 4.6  HGB 10.7* 10.2* 9.3* 9.1* 9.1*  HCT 34.5* 32.6* 30.5* 30.1* 29.3*  PLT 66* 64* 74* 96* 107*  MCV 103.9* 102.5* 102.7* 104.9* 102.8*  MCH 32.2 32.1 31.3 31.7 31.9  MCHC 31.0 31.3 30.5 30.2 31.1  RDW 13.8 13.3 13.3 13.2 13.2    Recent Labs  Lab 03/06/23 0309 03/06/23 0939 03/07/23 0943 03/08/23 0628 03/10/23 0317 03/11/23 0341  NA 139  --  136 135 138 140  K 4.5  --  4.2 4.6 4.3 4.6  CL 109  --  107 108 109 110  CO2 21*  --  19* 20* 22 21*  GLUCOSE 118*  --  156* 99 131* 90  BUN 55*  --  64* 69* 74* 72*  CREATININE 2.77*  --  2.58* 2.64* 3.07* 2.61*  CALCIUM 8.9  --  8.9 8.8* 9.1 9.3  AST  --   --   --   --  65* 59*  ALT  --   --   --   --  86* 83*  ALKPHOS  --   --   --   --  94 98  BILITOT  --   --   --   --  0.5 0.4  ALBUMIN  --   --   --   --  2.7* 2.9*  MG  --  2.1  --   --  2.2 2.1  BNP  --  151.0*  --   --   --   --     ------------------------------------------------------------------------------------------------------------------ No results for input(s): "CHOL", "HDL",  "LDLCALC", "TRIG", "CHOLHDL", "LDLDIRECT" in the last 72 hours.   Lab Results  Component Value Date   HGBA1C 4.9 03/12/2022   ------------------------------------------------------------------------------------------------------------------ No results for input(s): "TSH", "T4TOTAL", "T3FREE", "THYROIDAB" in the  last 72 hours.  Invalid input(s): "FREET3"  Cardiac Enzymes No results for input(s): "CKMB", "TROPONINI", "MYOGLOBIN" in the last 168 hours.  Invalid input(s): "CK" ------------------------------------------------------------------------------------------------------------------    Component Value Date/Time   BNP 151.0 (H) 03/06/2023 0939    CBG: No results for input(s): "GLUCAP" in the last 168 hours.  Recent Results (from the past 240 hours)  Surgical PCR screen     Status: None   Collection Time: 03/02/23 12:53 AM   Specimen: Nasal Mucosa; Nasal Swab  Result Value Ref Range Status   MRSA, PCR NEGATIVE NEGATIVE Final   Staphylococcus aureus NEGATIVE NEGATIVE Final    Comment: (NOTE) The Xpert SA Assay (FDA approved for NASAL specimens in patients 73 years of age and older), is one component of a comprehensive surveillance program. It is not intended to diagnose infection nor to guide or monitor treatment. Performed at [facility], [address], [place], [place] [address]   Aerobic/Anaerobic Culture w Gram Stain (surgical/deep wound)     Status: None   Collection Time: 03/02/23  4:37 PM   Specimen: Toe, Right; Amputation  Result Value Ref Range Status   Specimen Description   Final    TOE RIGHT 4TH TOE Performed at [facility], [address], [place], [place] [address]    Special Requests   Final    NONE Performed at [facility], [address], [place], [place] [address]    Gram Stain NO WBC SEEN NO ORGANISMS SEEN   Final   Culture   Final    No growth aerobically or  anaerobically. Performed at [facility], [address], [place], [place] [address]    Report Status 03/07/2023 FINAL  Final     Radiology Studies: No results found.   Pamella Pert, MD, PhD [facility]  Between 7 am - 7 pm I am available, please contact me via Amion (for emergencies) or Securechat (non urgent messages)  Between 7 pm - 7 am I am not available, please contact night coverage MD/APP via Amion

## 2023-03-11 NOTE — Plan of Care (Signed)
Problem: Education: Goal: Knowledge of General Education information will improve Description Including pain rating scale, medication(s)/side effects and non-pharmacologic comfort measures Outcome: Progressing

## 2023-03-11 NOTE — Telephone Encounter (Signed)
The cavity filling is a low risk procedure and he can go ahead with it. Please ask his dentist to use an anesthetic without added epinephrine

## 2023-03-11 NOTE — TOC Progression Note (Signed)
Transition of Care [facility]) - Progression Note   Patient Details  Name: Jordan Oconnell MRN: [id number] Date of Birth: 06-Jul-1940  Transition of Care [facility]) CM/SW Contact  Ewing Schlein, LCSW Phone Number: 03/11/2023, 11:19 AM  Clinical Narrative: CSW met with patient and his son on the phone to discuss discharge planning. Patient and son are in agreement with patient going home with [facility] (PT/OT/RN) as patient is doing better with mobility today. Pam to complete in person training this afternoon. Patient to contact significant other about teaching this afternoon. Patient reported he will use his significant other's rolling walker and his rollator when he returns home. HH orders placed by hospitalist. OPAT order to be signed. TOC to follow.    Barriers to Discharge: Continued Medical Work up  Expected Discharge Plan and Services Discharge Planning Services: NA Living arrangements for the past 2 months: Single Family Home HH Arranged: RN, PT, OT [facility] Agency: [facility] Date [facility] Agency Contacted: 03/08/23 Time HH Agency Contacted: 1237 Representative spoke with at [facility] Agency: Cindie  Social Determinants of Health (SDOH) Interventions SDOH Screenings   Food Insecurity: No Food Insecurity (03/01/2023)  Housing: Low Risk  (03/02/2023)  Transportation Needs: No Transportation Needs (03/01/2023)  Utilities: Not At Risk (03/01/2023)  Social Connections: Unknown (03/01/2023)  Tobacco Use: Low Risk  (03/02/2023)   Readmission Risk Interventions    03/08/2023   12:36 PM  Readmission Risk Prevention Plan  Transportation Screening Complete  PCP or Specialist Appt within 3-5 Days Complete  HRI or Home Care Consult Complete  Social Work Consult for Recovery Care Planning/Counseling Complete  Palliative Care Screening Not Applicable  Medication Review Oceanographer) Complete

## 2023-03-11 NOTE — Consult Note (Signed)
WOC Nurse ostomy consult note; patient has a urostomy that he states was placed 10 years ago at [facility]  Stoma type/location: urostomy RMQ  Stomal assessment/size: 1 3/8" round pink moist, slightly prolapsed,  productive of yellow urine  Peristomal assessment: intact  Treatment options for stomal/peristomal skin: patient uses no sting barrier wipe only  Output yellow urine  Ostomy pouching: 2 piece convex 2 1/4" skin barrier [facility] [place] [phone]), 2 1/4" pouch [facility] [place] [phone]) Education provided:  none, patient is independent in care Enrolled patient in [employer] DC program: no already receiving supplies   WOC team consulted for supplies. Patient uses a 2 piece convex [employer] system at home. Patient has asked a family member to bring in his home supplies. I will leave (1)2 1/4" convex skin barrier and pouch at bedside in case patient needs it before family brings in his home supplies.    WOC team will not follow. Re-consult if further needs arise.   Thanks,  [employer] MSN, RN-BC, [employer] [phone]

## 2023-03-12 ENCOUNTER — Other Ambulatory Visit (HOSPITAL_COMMUNITY): Payer: Self-pay

## 2023-03-12 DIAGNOSIS — M869 Osteomyelitis, unspecified: Secondary | ICD-10-CM | POA: Diagnosis not present

## 2023-03-12 MED ORDER — ATORVASTATIN CALCIUM 10 MG PO TABS
10.0000 mg | ORAL_TABLET | Freq: Every day | ORAL | 0 refills | Status: AC
  Filled 2023-03-12: qty 90, 90d supply, fill #0

## 2023-03-12 MED ORDER — CYANOCOBALAMIN 1000 MCG PO TABS
1000.0000 ug | ORAL_TABLET | Freq: Every day | ORAL | 0 refills | Status: AC
  Filled 2023-03-12: qty 90, 90d supply, fill #0

## 2023-03-12 MED ORDER — OXYCODONE HCL 5 MG PO TABS
5.0000 mg | ORAL_TABLET | Freq: Four times a day (QID) | ORAL | 0 refills | Status: DC | PRN
  Filled 2023-03-12: qty 20, 5d supply, fill #0

## 2023-03-12 MED ORDER — AMLODIPINE BESYLATE 10 MG PO TABS
10.0000 mg | ORAL_TABLET | Freq: Every day | ORAL | 0 refills | Status: DC
  Filled 2023-03-12: qty 90, 90d supply, fill #0

## 2023-03-12 MED ORDER — HEPARIN SOD (PORK) LOCK FLUSH 100 UNIT/ML IV SOLN
250.0000 [IU] | INTRAVENOUS | Status: AC | PRN
  Administered 2023-03-12: 250 [IU]

## 2023-03-12 MED ORDER — ISOSORBIDE MONONITRATE ER 30 MG PO TB24
30.0000 mg | ORAL_TABLET | Freq: Every day | ORAL | 0 refills | Status: DC
  Filled 2023-03-12: qty 90, 90d supply, fill #0

## 2023-03-12 MED ORDER — NITROGLYCERIN 0.4 MG SL SUBL
0.4000 mg | SUBLINGUAL_TABLET | SUBLINGUAL | 0 refills | Status: DC | PRN
  Filled 2023-03-12: qty 25, 8d supply, fill #0

## 2023-03-12 MED ORDER — ALLOPURINOL 100 MG PO TABS
100.0000 mg | ORAL_TABLET | Freq: Every day | ORAL | 0 refills | Status: DC
  Filled 2023-03-12: qty 30, 30d supply, fill #0

## 2023-03-12 MED ORDER — HYDRALAZINE HCL 25 MG PO TABS
25.0000 mg | ORAL_TABLET | Freq: Two times a day (BID) | ORAL | 0 refills | Status: DC
  Filled 2023-03-12: qty 180, 90d supply, fill #0

## 2023-03-12 MED ORDER — ASPIRIN 81 MG PO TBEC
81.0000 mg | DELAYED_RELEASE_TABLET | Freq: Every day | ORAL | 0 refills | Status: DC
  Filled 2023-03-12: qty 90, 90d supply, fill #0

## 2023-03-12 NOTE — TOC Transition Note (Signed)
Transition of Care [facility]) - Discharge Note   Patient Details  Name: Jordan Oconnell MRN: [id number] Date of Birth: December 07, 1940  Transition of Care [facility]) CM/SW Contact:  Adrian Prows, RN Phone Number: 03/12/2023, 10:34 AM   Clinical Narrative:    D/C orders received; HHPT/OT/RN previously arranged w/ [facility]; and home IV antibiotics w/ [facility]; Cory at [place] and Jeri Modena at [employer] [facility]; spoke w/ pt in room; he says family will pick him up at 2 PM; no TOC needs.  Final next level of care: Home w Home Health Services Barriers to Discharge: No Barriers Identified   Patient Goals and CMS Choice Patient states their goals for this hospitalization and ongoing recovery are:: return home   Choice offered to / list presented to : NA      Discharge Placement                       Discharge Plan and Services Additional resources added to the After Visit Summary for     Discharge Planning Services: NA            DME Arranged: N/A DME Agency: NA       HH Arranged: RN, PT, OT HH Agency: [facility] Date [facility] Agency Contacted: 03/08/23 Time HH Agency Contacted: 1237 Representative spoke with at [facility] Agency: Cindie  Social Drivers of Health (SDOH) Interventions SDOH Screenings   Food Insecurity: No Food Insecurity (03/01/2023)  Housing: Low Risk  (03/02/2023)  Transportation Needs: No Transportation Needs (03/01/2023)  Utilities: Not At Risk (03/01/2023)  Social Connections: Unknown (03/01/2023)  Tobacco Use: Low Risk  (03/02/2023)     Readmission Risk Interventions    03/08/2023   12:36 PM  Readmission Risk Prevention Plan  Transportation Screening Complete  PCP or Specialist Appt within 3-5 Days Complete  HRI or Home Care Consult Complete  Social Work Consult for Recovery Care Planning/Counseling Complete  Palliative Care Screening Not Applicable  Medication Review Oceanographer) Complete

## 2023-03-12 NOTE — Plan of Care (Signed)

## 2023-03-12 NOTE — Discharge Summary (Signed)
Physician Discharge Summary  Jordan Oconnell QMV:[id number] DOB: [birth year] DOA: 03/01/2023  PCP: Geoffry Paradise, MD  Admit date: 03/01/2023 Discharge date: 03/12/2023  Admitted From: home Disposition:  home  Recommendations for Outpatient Follow-up:  Follow up with PCP in 1-2 weeks Follow-up with cardiology as an outpatient within 2 to 3 weeks.  Home Health: PT Equipment/Devices: none  Discharge Condition:  CODE STATUS: Full code  HPI: Per admitting MD, Jordan Oconnell is a 83 y.o. male with medical history significant for PAF on Eliquis, AS s/p TAVR, tachy-brady syndrome s/p PPM CKD stage IV, urothelial cancer s/p multiple TURBTs with urostomy in place, RCC s/p left nephrectomy with recurrent urothelial disease to solitary right renal pelvis, hypothyroidism who was directly admitted for management of right fourth toe osteomyelitis. Patient directly admitted per orthopedics, Dr. Odis Hollingshead, for management of osteomyelitis of the right fourth toe.  Plan is for amputation tomorrow 03/01/23. Patient denies any fevers, chills, diaphoresis, chest pain, dyspnea.  He reports good output from his urostomy.  He is on chronic anticoagulation with Eliquis.  He says he last took Eliquis this morning 2/4.  He says he has been taking Keflex for the last 2 days.  Hospital Course / Discharge diagnoses: Principal Problem:   Osteomyelitis of fourth toe of right foot (HCC) Active Problems:   Urothelial cancer (HCC)   PAF (paroxysmal atrial fibrillation) (HCC)   CKD (chronic kidney disease) stage 4, GFR 15-29 ml/min (HCC)   S/P TAVR (transcatheter aortic valve replacement)   Chronic combined systolic and diastolic CHF (congestive heart failure) (HCC)   Hypothyroidism   Non-ST elevation (NSTEMI) myocardial infarction [facility])   Primary hypertension   Principal problem Angina, NSTEMI -Patient developed chest pressure 2/9, with troponin elevation into the 400 range.  Cardiology consulted and followed  patient while hospitalized.  He was placed on heparin infusion, transferred to stepdown and plans were in place for patient to undergo cardiac catheterization, however after discussions between the patient and the cardiology he declined to undergo cardiac cath and will be managed medically.  Underwent a 2D echo which showed LVEF 45-50%, no obvious wall motion abnormalities, grade 1 diastolic dysfunction. He is now chest pain-free, has completed heparin as a medical treatment for non-STEMI and transition back to Eliquis, also continue aspirin per cardiology recommendations.  He was started on statin as well.   Active problems Osteomyelitis of the Right fourth toe -Presented as a direct admission per orthopedic surgery. Underwent right fourth toe disarticulation at metatarsophalangeal joint.  ID consulted as well, recommend 6 weeks of IV antibiotics with ceftriaxone and Cubicin.  He had a central line placed 2/7 per IR.  WBAT operative extremity in postop shoe, elevation recommended.  Home health, home antibiotics arranged  Acute kidney injury on CKD  Stage IV -baseline creatinine seems to be around 2.4-2.7, it was up to 3.7 this hospitalization, varying on a daily basis.  Overall stable, currently at 2.6 Urothelial cancer s/p urostomy and RCC s/p left nephrectomy with recurrent disease to solitary right kidney -Has multifocal urothelial disease, follows with [facility] oncology and nephrology. On active surveillance after most recent treatment being radiation therapy in 12 months of pembrolizumab. Continue with urostomy care Paroxysmal atrial fibrillation/flutter, tachy-brady syndrome s/p PPM -Continue Coreg, amiodarone, Eliquis Hypertension - Started Norvasc per cardiology. Monitor BP, stable Chronic combined systolic and diastolic CHF - Last EF improved to 50-55% (09/22/2022) compared to prior 30-35% (06/02/2022).  Appears euvolemic.  Continue medical management Hypothyroidism - Continue Synthroid. Severe  aortic stenosis s/p TAVR 01/2021 - Stable on last echo 09/22/2022. Insomnia - Continue mirtazapine and Xanax 0.25 mg nightly. Thrombocytopenia - follow trend.  Overall stable, no bleeding, platelets improving  B12 deficiency - Started intramuscular supplement, transition to oral supplement.  Right foot pain - Uric acid elevated, probably gout. Will start Allopurinol. Received a dose of Colchicine.  Resolved Right knee swelling-very mild if any tenderness on the lateral aspect, x-ray unremarkable, showing osteoarthritis and very small effusion.  He is able to ambulate without difficulties.  Continue supportive care, outpatient management  Sepsis ruled out   Discharge Instructions  Discharge Instructions     [facility] pharmacist to adjust dose for Vancomycin, Aminoglycosides and other anti-infective therapies as requested by physician.   Complete by: As directed    [facility] to provide Cath Flo 2mg    Complete by: As directed    Administer for PICC line occlusion and as ordered by physician for other access device issues.   Anaphylaxis Kit: Provided to treat any anaphylactic reaction to the medication being provided to the patient if First Dose or when requested by physician   Complete by: As directed    Epinephrine 1mg /ml vial / amp: Administer 0.3mg  (0.54ml) subcutaneously once for moderate to severe anaphylaxis, nurse to call physician and pharmacy when reaction occurs and call 911 if needed for immediate care   Diphenhydramine 50mg /ml IV vial: Administer 25-50mg  IV/IM PRN for first dose reaction, rash, itching, mild reaction, nurse to call physician and pharmacy when reaction occurs   Sodium Chloride 0.9% NS IV: Administer if needed for hypovolemic blood pressure drop or as ordered by physician after call to physician with anaphylactic reaction   Change dressing on IV access line weekly and PRN   Complete by: As directed    Flush IV access with Sodium Chloride  0.9% and Heparin 10 units/ml or 100 units/ml   Complete by: As directed    Home infusion instructions - [facility]   Complete by: As directed    Instructions: Flush IV access with Sodium Chloride 0.9% and Heparin 10units/ml or 100units/ml   Change dressing on IV access line: Weekly and PRN   Instructions Cath Flo 2mg : Administer for PICC Line occlusion and as ordered by physician for other access device   [facility] pharmacist to adjust dose for: Vancomycin, Aminoglycosides and other anti-infective therapies as requested by physician   Method of administration may be changed at the discretion of home infusion pharmacist based upon assessment of the patient and/or caregiver's ability to self-administer the medication ordered   Complete by: As directed       Allergies as of 03/12/2023       Reactions   Amoxicillin Other (See Comments)   Acute interstitial nephritis Other Reaction(s): Not available   Tamsulosin    Other Reaction(s): Not available   Doxycycline Hives   Other Reaction(s): Not available   Flomax [tamsulosin Hcl] Other (See Comments)   Dizzy        Medication List     STOP taking these medications    cephALEXin 500 MG capsule Commonly known as: KEFLEX   sulfamethoxazole-trimethoprim 400-80 MG tablet Commonly known as: BACTRIM       TAKE these medications    acetaminophen 500 MG tablet Commonly known as: TYLENOL Take 1,000-1,500 mg by mouth 2 (two) times daily as needed for moderate pain (pain score 4-6).   allopurinol 100 MG tablet Commonly known as: ZYLOPRIM Take 1  tablet (100 mg total) by mouth daily.   ALPRAZolam 0.25 MG tablet Commonly known as: XANAX Take 0.25 mg by mouth at bedtime.   amiodarone 200 MG tablet Commonly known as: PACERONE Take 1 tablet (200 mg total) by mouth daily.   amLODipine 10 MG tablet Commonly known as: NORVASC Take 1 tablet (10 mg total) by mouth daily.   aspirin EC 81 MG tablet Take 1  tablet (81 mg total) by mouth daily. Swallow whole.   atorvastatin 10 MG tablet Commonly known as: LIPITOR Take 1 tablet (10 mg total) by mouth daily.   azithromycin 500 MG tablet Commonly known as: ZITHROMAX TAKE 1 TABLET BY MOUTH ONCE DAILY 1  HOUR  BEFORE  DENTAL  PROCEDURES  AND  CLEANINGS   carvedilol 12.5 MG tablet Commonly known as: COREG Take 12.5 mg by mouth 2 (two) times daily.   cefTRIAXone IVPB Commonly known as: ROCEPHIN Inject 2 g into the vein daily. Indication:  osteomyelitis of right foot  First Dose: Yes Last Day of Therapy:  04/13/23 Labs - Once weekly:  CBC/D and BMP, Labs - Once weekly: ESR and CRP Method of administration: IV Push Method of administration may be changed at the discretion of home infusion pharmacist based upon assessment of the patient and/or caregiver's ability to self-administer the medication ordered.   cyanocobalamin 1000 MCG tablet Take 1 tablet (1,000 mcg total) by mouth daily.   daptomycin IVPB Commonly known as: CUBICIN Inject 700 mg into the vein every other day. Indication:  Osteomyelitis of right foot  First Dose: Yes Last Day of Therapy:  04/13/23 Labs - Once weekly:  CBC/D, BMP, and CPK Labs - Once weekly: ESR and CRP Method of administration: IV Push Method of administration may be changed at the discretion of home infusion pharmacist based upon assessment of the patient and/or caregiver's ability to self-administer the medication ordered.   Eliquis 2.5 MG Tabs tablet Generic drug: apixaban Take 1 tablet (2.5 mg total) by mouth 2 (two) times daily.   ferrous sulfate 325 (65 FE) MG EC tablet Take 1 tablet (325 mg total) by mouth in the morning and at bedtime.   hydrALAZINE 25 MG tablet Commonly known as: APRESOLINE Take 1 tablet (25 mg total) by mouth 2 (two) times daily.   isosorbide mononitrate 30 MG 24 hr tablet Commonly known as: IMDUR Take 1 tablet (30 mg total) by mouth daily.   levothyroxine 75 MCG  tablet Commonly known as: SYNTHROID Take 75 mcg by mouth daily before breakfast.   mirtazapine 15 MG tablet Commonly known as: REMERON Take 7.5 mg by mouth at bedtime.   nitroGLYCERIN 0.4 MG SL tablet Commonly known as: NITROSTAT Place 1 tablet (0.4 mg total) under the tongue every 5 (five) minutes as needed for chest pain.   oxyCODONE 5 MG immediate release tablet Commonly known as: Oxy IR/ROXICODONE Take 1 tablet (5 mg total) by mouth every 6 (six) hours as needed for moderate pain (pain score 4-6).   sodium bicarbonate 650 MG tablet Take 1,300 mg by mouth 2 (two) times daily.               Discharge Care Instructions  (From admission, onward)           Start     Ordered   03/11/23 0000  Change dressing on IV access line weekly and PRN  (Home infusion instructions - [facility] )        03/11/23 1052  Follow-up Information     [facility] Follow up.   Specialty: Home Health Services Why: [facility] will provide PT, OT, and nursing after discharge in the home. Contact information: [address] [place] [address] [phone]         [facility] Follow up.   Why: [facility] will provide IV antibiotics in the home after discharge.                Consultations: Orthopedic surgery Cardiology Infectious disease  Procedures/Studies:  DG Knee 1-2 Views Right Result Date: 03/11/2023 CLINICAL DATA:  Swelling and tenderness EXAM: RIGHT KNEE - 2 VIEW COMPARISON:  None Available. FINDINGS: Minimal osteophytes of the medial compartment and patellofemoral joint. Osteopenia. No fracture or dislocation. Trace joint fluid on lateral view. Osteopenia. Scattered vascular calcifications IMPRESSION: Slight degenerative change.  Trace joint fluid.  Osteopenia Electronically Signed   By: Karen Kays M.D.   On: 03/11/2023 12:49   DG Foot Complete Left Result Date: 03/07/2023 CLINICAL DATA:  Foot infection EXAM: LEFT FOOT  - COMPLETE 3+ VIEW COMPARISON:  None Available. FINDINGS: Vascular calcifications. No fracture or malalignment. Small plantar calcaneal spur. Moderate degenerative change at the first MTP joint. IP joint space narrowing. No osseous destructive change or soft tissue emphysema IMPRESSION: No acute osseous abnormality. Degenerative changes. Electronically Signed   By: Jasmine Pang M.D.   On: 03/07/2023 20:57   CT HEAD WO CONTRAST ( ) Result Date: 03/06/2023 CLINICAL DATA:  Altered mental status. Transient episode of abnormal vision. EXAM: CT HEAD WITHOUT CONTRAST TECHNIQUE: Contiguous axial images were obtained from the base of the skull through the vertex without intravenous contrast. RADIATION DOSE REDUCTION: This exam was performed according to the departmental dose-optimization program which includes automated exposure control, adjustment of the mA and/or kV according to patient size and/or use of iterative reconstruction technique. COMPARISON:  CT head without contrast 03/26/2022. FINDINGS: Brain: No acute infarct, hemorrhage, or mass lesion is present. Mild atrophy and white matter changes are present bilaterally, stable. The ventricles are of normal size. No significant extraaxial fluid collection is present. Remote lacunar infarct is present in the posteroinferior right cerebellum. 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 The brainstem and cerebellum are otherwise within normal limits. Vascular: Atherosclerotic calcifications are present within the cavernous internal carotid arteries bilaterally and at the dural margin of the right vertebral artery. No hyperdense vessel is present. Skull: Calvarium is intact. No focal lytic or blastic lesions are present. No significant extracranial soft tissue lesion is present. Sinuses/Orbits: The paranasal sinuses and mastoid air cells are clear. The globes and orbits are within normal limits. IMPRESSION: 1. No acute intracranial abnormality or significant interval change.  2. Stable atrophy and white matter disease. This likely reflects the sequela of chronic microvascular ischemia. 3. Remote lacunar infarct of the posteroinferior right cerebellum. Electronically Signed   By: Marin Roberts M.D.   On: 03/06/2023 16:46   ECHOCARDIOGRAM COMPLETE Result Date: 03/06/2023    ECHOCARDIOGRAM REPORT   Patient Name:   Jordan Oconnell Date of Exam: 03/06/2023 Medical Rec #:  [id number]         Height:       73.0 in Accession #:    [id number]        Weight:       212.1 lb Date of Birth:  1940-04-22         BSA:          2.206 m Patient Age:    68  years          BP:           136/67 mmHg Patient Gender: M                 HR:           60 bpm. Exam Location:  Inpatient Procedure: 2D Echo, Cardiac Doppler and Color Doppler Indications:    Dyspnea R06.00  History:        Patient has prior history of Echocardiogram examinations, most                 recent 09/22/2022. Aortic Valve Disease; Risk                 Factors:Hypertension.                 Aortic Valve: 29 mm Edwards Sapien prosthetic, stented (TAVR)                 valve is present in the aortic position. Procedure Date:                 01/27/2021.  Sonographer:    Darlys Gales Referring Phys: [phone] BELKYS A REGALADO IMPRESSIONS  1. Poor Echo images.  2. Left ventricular ejection fraction, by estimation, is 45 to 50%. The left ventricle has mildly decreased function. Left ventricular endocardial border not optimally defined to evaluate regional wall motion. There is moderate left ventricular hypertrophy. Left ventricular diastolic parameters are consistent with Grade I diastolic dysfunction (impaired relaxation). Elevated left ventricular end-diastolic pressure.  3. Right ventricular systolic function is normal. The right ventricular size is normal. Tricuspid regurgitation signal is inadequate for assessing PA pressure.  4. Right atrial size was mild to moderately dilated.  5. The mitral valve is grossly normal. No evidence of mitral  valve regurgitation. No evidence of mitral stenosis.  6. The aortic valve has been repaired/replaced. Aortic valve regurgitation is not visualized. No aortic stenosis is present. There is a 29 mm Edwards Sapien prosthetic (TAVR) valve present in the aortic position. Procedure Date: 01/27/2021.  7. Aortic dilatation noted. There is mild dilatation of the ascending aorta, measuring 41 mm.  8. The inferior vena cava is normal in size with greater than 50% respiratory variability, suggesting right atrial pressure of 3 mmHg. Comparison(s): Changes from prior study are noted. LVEF appears to be mildly reduced, conisder Limited Echo with contrast for accurate estimation of LVEF. FINDINGS  Left Ventricle: Left ventricular ejection fraction, by estimation, is 45 to 50%. The left ventricle has mildly decreased function. Left ventricular endocardial border not optimally defined to evaluate regional wall motion. The left ventricular internal cavity size was normal in size. There is moderate left ventricular hypertrophy. Abnormal (paradoxical) septal motion, consistent with RV pacemaker. Left ventricular diastolic parameters are consistent with Grade I diastolic dysfunction (impaired relaxation). Elevated left ventricular end-diastolic pressure. Right Ventricle: The right ventricular size is normal. No increase in right ventricular wall thickness. Right ventricular systolic function is normal. Tricuspid regurgitation signal is inadequate for assessing PA pressure. Left Atrium: Left atrial size was normal in size. Right Atrium: Right atrial size was mild to moderately dilated. Pericardium: There is no evidence of pericardial effusion. Mitral Valve: The mitral valve is grossly normal. No evidence of mitral valve regurgitation. No evidence of mitral valve stenosis. Tricuspid Valve: The tricuspid valve is not well visualized. Tricuspid valve regurgitation is not demonstrated. No evidence of tricuspid stenosis. Aortic Valve: The aortic  valve  has been repaired/replaced. Aortic valve regurgitation is not visualized. No aortic stenosis is present. Aortic valve mean gradient measures 10.0 mmHg. Aortic valve peak gradient measures 16.8 mmHg. Aortic valve area, by VTI measures 1.36 cm. There is a 29 mm Edwards Sapien prosthetic, stented (TAVR) valve present in the aortic position. Procedure Date: 01/27/2021. Pulmonic Valve: The pulmonic valve was normal in structure. Pulmonic valve regurgitation is not visualized. No evidence of pulmonic stenosis. Aorta: Aortic dilatation noted. There is mild dilatation of the ascending aorta, measuring 41 mm. Venous: The inferior vena cava is normal in size with greater than 50% respiratory variability, suggesting right atrial pressure of 3 mmHg. IAS/Shunts: No atrial level shunt detected by color flow Doppler.  LEFT VENTRICLE PLAX 2D LVIDd:         5.00 cm   Diastology LVIDs:         2.70 cm   LV e' medial:    3.37 cm/s LV PW:         1.40 cm   LV E/e' medial:  21.4 LV IVS:        1.50 cm   LV e' lateral:   3.37 cm/s LVOT diam:     2.00 cm   LV E/e' lateral: 21.4 LV SV:         62 LV SV Index:   28 LVOT Area:     3.14 cm  RIGHT VENTRICLE RV S prime:     13.70 cm/s TAPSE (M-mode): 3.5 cm LEFT ATRIUM             Index        RIGHT ATRIUM           Index LA Vol (A2C):   64.6 ml 29.28 ml/m  RA Area:     23.80 cm LA Vol (A4C):   67.4 ml 30.55 ml/m  RA Volume:   74.30 ml  33.68 ml/m LA Biplane Vol: 68.2 ml 30.92 ml/m  AORTIC VALVE AV Area (Vmax):    1.42 cm AV Area (Vmean):   1.67 cm AV Area (VTI):     1.36 cm AV Vmax:           205.00 cm/s AV Vmean:          151.000 cm/s AV VTI:            0.454 m AV Peak Grad:      16.8 mmHg AV Mean Grad:      10.0 mmHg LVOT Vmax:         92.70 cm/s LVOT Vmean:        80.500 cm/s LVOT VTI:          0.197 m LVOT/AV VTI ratio: 0.43 MITRAL VALVE MV Area (PHT): 2.26 cm     SHUNTS MV Decel Time: 335 msec     Systemic VTI:  0.20 m MV E velocity: 72.00 cm/s   Systemic Diam: 2.00 cm MV A  velocity: 105.00 cm/s MV E/A ratio:  0.69 Vishnu Priya Mallipeddi Electronically signed by Winfield Rast Mallipeddi Signature Date/Time: 03/06/2023/2:14:17 PM    Final    DG CHEST PORT 1 VIEW Result Date: 03/06/2023 CLINICAL DATA:  Dyspnea. EXAM: PORTABLE CHEST 1 VIEW COMPARISON:  03/14/2022 FINDINGS: The cardiopericardial silhouette is within normal limits for size. Status post TAVR. Left permanent pacemaker again noted. Right IJ central line tip overlies the region of the innominate vein confluence. The lungs are clear without focal pneumonia, edema, pneumothorax or pleural effusion. No acute bony abnormality. IMPRESSION:  No active disease. Electronically Signed   By: Kennith Center M.D.   On: 03/06/2023 09:46   IR US Guide Vasc Access Right Result Date: 03/04/2023 CLINICAL DATA:  Osteomyelitis, needs durable venous access for antibiotics EXAM: TUNNELED CENTRAL VENOUS CATHETER PLACEMENT WITH ULTRASOUND AND FLUOROSCOPIC GUIDANCE TECHNIQUE: The procedure, risks, benefits, and alternatives were explained to the patient. Questions regarding the procedure were encouraged and answered. The patient understands and consents to the procedure. Patency of the right IJ vein was confirmed with ultrasound with image documentation. An appropriate skin site was determined. Region was prepped using maximum barrier technique including cap and mask, sterile gown, sterile gloves, large sterile sheet, and Chlorhexidine as cutaneous antisepsis. The region was infiltrated locally with 1% lidocaine. Under real-time ultrasound guidance, the right IJ vein was accessed with a 21 gauge micropuncture needle; the needle tip within the vein was confirmed with ultrasound image documentation. 92F single lumen cuffed PowerLine tunneled from a right anterior chest wall approach to the dermatotomy site. Needle exchanged over the 018 guidewire for transitional dilator, through which the catheter which had been cut to 20 cm was advanced under  intermittent fluoroscopy, positioned with its tip at the cavoatrial junction. Spot chest radiograph confirms good catheter position. No pneumothorax. Catheter was flushed per protocol. Catheter secured externally with O Prolene suture. The right IJ dermatotomy site was closed with Dermabond. COMPLICATIONS: COMPLICATIONS None immediate FLUOROSCOPY TIME:  Radiation Exposure Index (as provided by the fluoroscopic device): 2 mGy air Kerma COMPARISON:  None Available. IMPRESSION: 1. Technically successful placement of tunneled right IJ tunneled single-lumen power injectable catheter with ultrasound and fluoroscopic guidance. Ready for routine use. Electronically Signed   By: Corlis Leak M.D.   On: 03/04/2023 17:01   IR Fluoro Guide CV Line Right Result Date: 03/04/2023 CLINICAL DATA:  Osteomyelitis, needs durable venous access for antibiotics EXAM: TUNNELED CENTRAL VENOUS CATHETER PLACEMENT WITH ULTRASOUND AND FLUOROSCOPIC GUIDANCE TECHNIQUE: The procedure, risks, benefits, and alternatives were explained to the patient. Questions regarding the procedure were encouraged and answered. The patient understands and consents to the procedure. Patency of the right IJ vein was confirmed with ultrasound with image documentation. An appropriate skin site was determined. Region was prepped using maximum barrier technique including cap and mask, sterile gown, sterile gloves, large sterile sheet, and Chlorhexidine as cutaneous antisepsis. The region was infiltrated locally with 1% lidocaine. Under real-time ultrasound guidance, the right IJ vein was accessed with a 21 gauge micropuncture needle; the needle tip within the vein was confirmed with ultrasound image documentation. 92F single lumen cuffed PowerLine tunneled from a right anterior chest wall approach to the dermatotomy site. Needle exchanged over the 018 guidewire for transitional dilator, through which the catheter which had been cut to 20 cm was advanced under  intermittent fluoroscopy, positioned with its tip at the cavoatrial junction. Spot chest radiograph confirms good catheter position. No pneumothorax. Catheter was flushed per protocol. Catheter secured externally with O Prolene suture. The right IJ dermatotomy site was closed with Dermabond. COMPLICATIONS: COMPLICATIONS None immediate FLUOROSCOPY TIME:  Radiation Exposure Index (as provided by the fluoroscopic device): 2 mGy air Kerma COMPARISON:  None Available. IMPRESSION: 1. Technically successful placement of tunneled right IJ tunneled single-lumen power injectable catheter with ultrasound and fluoroscopic guidance. Ready for routine use. Electronically Signed   By: Corlis Leak M.D.   On: 03/04/2023 17:01   MR FOOT RIGHT WO CONTRAST Result Date: 03/01/2023 CLINICAL DATA:  Preoperative evaluation.  Surgery tomorrow. EXAM: MRI OF THE RIGHT  FOREFOOT WITHOUT CONTRAST TECHNIQUE: Multiplanar, multisequence MR imaging of the right forefoot was performed. No intravenous contrast was administered. COMPARISON:  None Available. FINDINGS: Soft tissues There is moderate fourth toe soft tissue swelling and edema. This is greatest around the fourth PIP joint. No walled-off abscess is seen. Bones/Joint/Cartilage There is abnormal decreased T1 and increased T2 signal throughout the distal 80% of the proximal phalanx and near the entire middle phalanx of the fourth toe. Mild thinning of the skin just dorsal to the fourth PIP joint (sagittal series 9, image 10), possible ulcer/wound. There is moderate cortical erosion of the distal aspect of the proximal phalanx and mild cortical erosion within the proximal dorsal aspect of the middle phalanx of the fourth toe. There is mild-to-moderate chronic cystic change at the lateral aspect of the fifth metatarsal head. No definitive soft tissue ulcer is seen in this region. There is decreased T1 and decreased T2 signal within the subcutaneous fat plantar to the fifth metatarsal head  compatible with a pressure lesion (sagittal series 9, image 4 and coronal series 8, image 4). Recommend clinical correlation for any concern for soft tissue infection around the fifth metatarsal head. Moderate first tarsometatarsal cartilage thinning and subchondral cystic change. Ligaments The Lisfranc ligament complex is intact. There is attenuation and intermediate T2 signal within the fourth toe PIP medial and lateral collateral ligament likely secondary to the adjacent osteomyelitis. Otherwise, the metatarsophalangeal and interphalangeal coronal ligaments appear intact. Muscles and Tendons Mild diffuse edema throughout the intrinsic foot musculature, nonspecific. They flexor and extensor tendons are intact. IMPRESSION: 1. Possible shallow soft tissue ulcer dorsal to the fourth PIP joint. 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 2. Moderate fourth toe soft tissue swelling and edema. No walled-off abscess is seen. 3. There is mild-to-moderate chronic cystic change at the lateral aspect of the fifth metatarsal head. No definitive soft tissue ulcer or osteomyelitis is seen in this region. Likely noninfected pressure lesion plantar to the fifth metatarsal head. Recommend clinical correlation for any concern for soft tissue infection around the fifth metatarsal head. Electronically Signed   By: Neita Garnet M.D.   On: 03/01/2023 17:21   CUP PACEART REMOTE DEVICE CHECK Result Date: 02/22/2023 Scheduled remote reviewed. Normal device function.  10 AHR detections, longest 12 minutes, EGMs consistent with AFL.  Known history of PAF, on Eliquis per Epic. Next remote 91 days. - CS, CVRS    Subjective: - no chest pain, shortness of breath, no abdominal pain, nausea or vomiting.   Discharge Exam: BP 134/67 (BP Location: Left Arm)   Pulse 60   Temp 97.8 F (36.6 C) (Oral)   Resp 14   Ht 6\' 1"  (1.854 m)   Wt 91.5 kg   SpO2 96%   BMI 26.61  kg/m   General: Pt is alert, awake, not in acute distress Cardiovascular: RRR, S1/S2 +, no rubs, no gallops Respiratory: CTA bilaterally, no wheezing, no rhonchi Abdominal: Soft, NT, ND, bowel sounds + Extremities: no edema, no cyanosis    The results of significant diagnostics from this hospitalization (including imaging, microbiology, ancillary and laboratory) are listed below for reference.     Microbiology: Recent Results (from the past 240 hours)  Aerobic/Anaerobic Culture w Gram Stain (surgical/deep wound)     Status: None   Collection Time: 03/02/23  4:37 PM   Specimen: Toe, Right; Amputation  Result Value Ref Range Status   Specimen Description  Final    TOE RIGHT 4TH TOE Performed at [facility], [address] [place], [place] [address]    Special Requests   Final    NONE Performed at [facility], [address], [place], [place] [address]    Gram Stain NO WBC SEEN NO ORGANISMS SEEN   Final   Culture   Final    No growth aerobically or anaerobically. Performed at [facility], [address] [place], [place] [address]    Report Status 03/07/2023 FINAL  Final     Labs: Basic Metabolic Panel: Recent Labs  Lab 03/06/23 0309 03/06/23 0939 03/07/23 0943 03/08/23 0628 03/10/23 0317 03/11/23 0341  NA 139  --  136 135 138 140  K 4.5  --  4.2 4.6 4.3 4.6  CL 109  --  107 108 109 110  CO2 21*  --  19* 20* 22 21*  GLUCOSE 118*  --  156* 99 131* 90  BUN 55*  --  64* 69* 74* 72*  CREATININE 2.77*  --  2.58* 2.64* 3.07* 2.61*  CALCIUM 8.9  --  8.9 8.8* 9.1 9.3  MG  --  2.1  --   --  2.2 2.1  PHOS  --   --   --   --  2.9  --    Liver Function Tests: Recent Labs  Lab 03/10/23 0317 03/11/23 0341  AST 65* 59*  ALT 86* 83*  ALKPHOS 94 98  BILITOT 0.5 0.4  PROT 6.1* 5.9*  ALBUMIN 2.7* 2.9*   CBC: Recent Labs  Lab 03/06/23 0309 03/07/23 0500 03/08/23 0628 03/10/23 0317 03/11/23 0341  WBC 4.8 4.0 3.7*  5.3 4.6  HGB 10.7* 10.2* 9.3* 9.1* 9.1*  HCT 34.5* 32.6* 30.5* 30.1* 29.3*  MCV 103.9* 102.5* 102.7* 104.9* 102.8*  PLT 66* 64* 74* 96* 107*   CBG: No results for input(s): "GLUCAP" in the last 168 hours. Hgb A1c No results for input(s): "HGBA1C" in the last 72 hours. Lipid Profile No results for input(s): "CHOL", "HDL", "LDLCALC", "TRIG", "CHOLHDL", "LDLDIRECT" in the last 72 hours. Thyroid function studies No results for input(s): "TSH", "T4TOTAL", "T3FREE", "THYROIDAB" in the last 72 hours.  Invalid input(s): "FREET3" Urinalysis    Component Value Date/Time   COLORURINE YELLOW 01/23/2021 1000   APPEARANCEUR CLOUDY (A) 01/23/2021 1000   LABSPEC 1.010 01/23/2021 1000   PHURINE 8.0 01/23/2021 1000   GLUCOSEU NEGATIVE 01/23/2021 1000   HGBUR SMALL (A) 01/23/2021 1000   BILIRUBINUR NEGATIVE 01/23/2021 1000   KETONESUR NEGATIVE 01/23/2021 1000   PROTEINUR 100 (A) 01/23/2021 1000   UROBILINOGEN 0.2 07/17/2013 1146   NITRITE POSITIVE (A) 01/23/2021 1000   LEUKOCYTESUR LARGE (A) 01/23/2021 1000    FURTHER DISCHARGE INSTRUCTIONS:   Get Medicines reviewed and adjusted: Please take all your medications with you for your next visit with your Primary MD   Laboratory/radiological data: Please request your Primary MD to go over all hospital tests and procedure/radiological results at the follow up, please ask your Primary MD to get all Hospital records sent to his/her office.   In some cases, they will be blood work, cultures and biopsy results pending at the time of your discharge. Please request that your primary care M.D. goes through all the records of your hospital data and follows up on these results.   Also Note the following: If you experience worsening of your admission symptoms, develop shortness of breath, life threatening emergency, suicidal or homicidal thoughts  you must seek medical attention immediately by calling 911 or calling your MD immediately  if symptoms less  severe.   You must read complete instructions/literature along with all the possible adverse reactions/side effects for all the Medicines you take and that have been prescribed to you. Take any new Medicines after you have completely understood and accpet all the possible adverse reactions/side effects.    Do not drive when taking Pain medications or sleeping medications (Benzodaizepines)   Do not take more than prescribed Pain, Sleep and Anxiety Medications. It is not advisable to combine anxiety,sleep and pain medications without talking with your primary care practitioner   Special Instructions: If you have smoked or chewed Tobacco  in the last 2 yrs please stop smoking, stop any regular Alcohol  and or any Recreational drug use.   Wear Seat belts while driving.   Please note: You were cared for by a hospitalist during your hospital stay. Once you are discharged, your primary care physician will handle any further medical issues. Please note that NO REFILLS for any discharge medications will be authorized once you are discharged, as it is imperative that you return to your primary care physician (or establish a relationship with a primary care physician if you do not have one) for your post hospital discharge needs so that they can reassess your need for medications and monitor your lab values.  Time coordinating discharge: 35 minutes  SIGNED:  Pamella Pert, MD, PhD 03/12/2023, 7:42 AM

## 2023-03-12 NOTE — Progress Notes (Signed)
TOC meds delivered in a secure bag by this RN to patient in room. Primary RN updated that pt's ride will be here at 1400 and wants her included in the AVS teaching

## 2023-03-13 DIAGNOSIS — N184 Chronic kidney disease, stage 4 (severe): Secondary | ICD-10-CM | POA: Diagnosis not present

## 2023-03-13 DIAGNOSIS — Z89421 Acquired absence of other right toe(s): Secondary | ICD-10-CM | POA: Diagnosis not present

## 2023-03-13 DIAGNOSIS — C7901 Secondary malignant neoplasm of right kidney and renal pelvis: Secondary | ICD-10-CM | POA: Diagnosis not present

## 2023-03-13 DIAGNOSIS — I130 Hypertensive heart and chronic kidney disease with heart failure and stage 1 through stage 4 chronic kidney disease, or unspecified chronic kidney disease: Secondary | ICD-10-CM | POA: Diagnosis not present

## 2023-03-13 DIAGNOSIS — N179 Acute kidney failure, unspecified: Secondary | ICD-10-CM | POA: Diagnosis not present

## 2023-03-13 DIAGNOSIS — Z4781 Encounter for orthopedic aftercare following surgical amputation: Secondary | ICD-10-CM | POA: Diagnosis not present

## 2023-03-13 DIAGNOSIS — I214 Non-ST elevation (NSTEMI) myocardial infarction: Secondary | ICD-10-CM | POA: Diagnosis not present

## 2023-03-13 DIAGNOSIS — M869 Osteomyelitis, unspecified: Secondary | ICD-10-CM | POA: Diagnosis not present

## 2023-03-13 DIAGNOSIS — I5042 Chronic combined systolic (congestive) and diastolic (congestive) heart failure: Secondary | ICD-10-CM | POA: Diagnosis not present

## 2023-03-13 DIAGNOSIS — M86171 Other acute osteomyelitis, right ankle and foot: Secondary | ICD-10-CM | POA: Diagnosis not present

## 2023-03-13 DIAGNOSIS — C678 Malignant neoplasm of overlapping sites of bladder: Secondary | ICD-10-CM | POA: Diagnosis not present

## 2023-03-14 DIAGNOSIS — Z4781 Encounter for orthopedic aftercare following surgical amputation: Secondary | ICD-10-CM | POA: Diagnosis not present

## 2023-03-14 DIAGNOSIS — Z89421 Acquired absence of other right toe(s): Secondary | ICD-10-CM | POA: Diagnosis not present

## 2023-03-14 DIAGNOSIS — N179 Acute kidney failure, unspecified: Secondary | ICD-10-CM | POA: Diagnosis not present

## 2023-03-14 DIAGNOSIS — I130 Hypertensive heart and chronic kidney disease with heart failure and stage 1 through stage 4 chronic kidney disease, or unspecified chronic kidney disease: Secondary | ICD-10-CM | POA: Diagnosis not present

## 2023-03-14 DIAGNOSIS — M86171 Other acute osteomyelitis, right ankle and foot: Secondary | ICD-10-CM | POA: Diagnosis not present

## 2023-03-14 DIAGNOSIS — C7901 Secondary malignant neoplasm of right kidney and renal pelvis: Secondary | ICD-10-CM | POA: Diagnosis not present

## 2023-03-14 DIAGNOSIS — I214 Non-ST elevation (NSTEMI) myocardial infarction: Secondary | ICD-10-CM | POA: Diagnosis not present

## 2023-03-14 DIAGNOSIS — C678 Malignant neoplasm of overlapping sites of bladder: Secondary | ICD-10-CM | POA: Diagnosis not present

## 2023-03-14 DIAGNOSIS — I5042 Chronic combined systolic (congestive) and diastolic (congestive) heart failure: Secondary | ICD-10-CM | POA: Diagnosis not present

## 2023-03-14 NOTE — Telephone Encounter (Signed)
Spoke to patient Dr.Croitoru's advice given. 

## 2023-03-15 DIAGNOSIS — Z89421 Acquired absence of other right toe(s): Secondary | ICD-10-CM | POA: Diagnosis not present

## 2023-03-15 DIAGNOSIS — C678 Malignant neoplasm of overlapping sites of bladder: Secondary | ICD-10-CM | POA: Diagnosis not present

## 2023-03-15 DIAGNOSIS — I130 Hypertensive heart and chronic kidney disease with heart failure and stage 1 through stage 4 chronic kidney disease, or unspecified chronic kidney disease: Secondary | ICD-10-CM | POA: Diagnosis not present

## 2023-03-15 DIAGNOSIS — I214 Non-ST elevation (NSTEMI) myocardial infarction: Secondary | ICD-10-CM | POA: Diagnosis not present

## 2023-03-15 DIAGNOSIS — N179 Acute kidney failure, unspecified: Secondary | ICD-10-CM | POA: Diagnosis not present

## 2023-03-15 DIAGNOSIS — Z452 Encounter for adjustment and management of vascular access device: Secondary | ICD-10-CM | POA: Diagnosis not present

## 2023-03-15 DIAGNOSIS — C7901 Secondary malignant neoplasm of right kidney and renal pelvis: Secondary | ICD-10-CM | POA: Diagnosis not present

## 2023-03-15 DIAGNOSIS — I5042 Chronic combined systolic (congestive) and diastolic (congestive) heart failure: Secondary | ICD-10-CM | POA: Diagnosis not present

## 2023-03-15 DIAGNOSIS — Z4781 Encounter for orthopedic aftercare following surgical amputation: Secondary | ICD-10-CM | POA: Diagnosis not present

## 2023-03-15 DIAGNOSIS — M86171 Other acute osteomyelitis, right ankle and foot: Secondary | ICD-10-CM | POA: Diagnosis not present

## 2023-03-15 LAB — LAB REPORT - SCANNED: EGFR: 21

## 2023-03-17 ENCOUNTER — Telehealth: Payer: Self-pay

## 2023-03-17 DIAGNOSIS — I5042 Chronic combined systolic (congestive) and diastolic (congestive) heart failure: Secondary | ICD-10-CM | POA: Diagnosis not present

## 2023-03-17 DIAGNOSIS — Z4781 Encounter for orthopedic aftercare following surgical amputation: Secondary | ICD-10-CM | POA: Diagnosis not present

## 2023-03-17 DIAGNOSIS — C7901 Secondary malignant neoplasm of right kidney and renal pelvis: Secondary | ICD-10-CM | POA: Diagnosis not present

## 2023-03-17 DIAGNOSIS — I214 Non-ST elevation (NSTEMI) myocardial infarction: Secondary | ICD-10-CM | POA: Diagnosis not present

## 2023-03-17 DIAGNOSIS — C678 Malignant neoplasm of overlapping sites of bladder: Secondary | ICD-10-CM | POA: Diagnosis not present

## 2023-03-17 DIAGNOSIS — M86171 Other acute osteomyelitis, right ankle and foot: Secondary | ICD-10-CM | POA: Diagnosis not present

## 2023-03-17 DIAGNOSIS — Z89421 Acquired absence of other right toe(s): Secondary | ICD-10-CM | POA: Diagnosis not present

## 2023-03-17 DIAGNOSIS — I130 Hypertensive heart and chronic kidney disease with heart failure and stage 1 through stage 4 chronic kidney disease, or unspecified chronic kidney disease: Secondary | ICD-10-CM | POA: Diagnosis not present

## 2023-03-17 DIAGNOSIS — N179 Acute kidney failure, unspecified: Secondary | ICD-10-CM | POA: Diagnosis not present

## 2023-03-17 NOTE — Telephone Encounter (Signed)
Yes empiric

## 2023-03-17 NOTE — Telephone Encounter (Signed)
Contacted the patient today in regards to elevated CPK levels due to Daptomycin. His CPK was 85 on 03/11/23. On 03/15/23, it was 3,650. This is more than 10 times the ULN. We discussed associated symptoms connected to elevated CPK levels. He mentioned that he has a "pull going through his right side". This doesn't cause problems with walking. He has good urine output. His urine has been a light orange to orange color. No signs of dark colored urine. I discussed with the patient to go to the ED if experiences dark colored urine or severe muscle pain. He mentions that he lives alone. His partner has been helping him everyday, but is no longer able to help him everyday. He is requesting a "substitute" to help him. In order to reduce CPK levels, recommend changing daptomycin to dalbavancin. However, Marchelle Folks spoke with Pam whom said they can't do dalbavancin at home unfortunately. Other options may be bactrim, vancomycin, or linezolid.  Just to mention, the patient does have CKD and low platelets. Therefore, his remaining options are still not great choices and will require monitoring.

## 2023-03-18 NOTE — Telephone Encounter (Signed)
Spoke with Jordan Oconnell at [facility] this morning for insurance verification; he is sending to their team for investigation. He will hopefully get back to me before we close, but I asked him to LVM if not. If anything, patient may be without empiric MRSA coverage for 3 days. Would follow up first thing next week if needed. Just want to ensure it is covered reasonably through his insurance as this may be quite expensive.

## 2023-03-18 NOTE — Telephone Encounter (Signed)
Well dang - he says he had a reaction to doxycycline many years ago - sure enough it is listed in his chart. He says he can' remember the reaction because it has been so long ago but is listed as hives in chart.

## 2023-03-18 NOTE — Telephone Encounter (Signed)
No issues there - we will call home health to stop daptomycin and inform patient to start doxycycline - thank you!

## 2023-03-19 DIAGNOSIS — N179 Acute kidney failure, unspecified: Secondary | ICD-10-CM | POA: Diagnosis not present

## 2023-03-19 DIAGNOSIS — M869 Osteomyelitis, unspecified: Secondary | ICD-10-CM | POA: Diagnosis not present

## 2023-03-19 DIAGNOSIS — N184 Chronic kidney disease, stage 4 (severe): Secondary | ICD-10-CM | POA: Diagnosis not present

## 2023-03-21 DIAGNOSIS — I5042 Chronic combined systolic (congestive) and diastolic (congestive) heart failure: Secondary | ICD-10-CM | POA: Diagnosis not present

## 2023-03-21 DIAGNOSIS — Z4781 Encounter for orthopedic aftercare following surgical amputation: Secondary | ICD-10-CM | POA: Diagnosis not present

## 2023-03-21 DIAGNOSIS — C678 Malignant neoplasm of overlapping sites of bladder: Secondary | ICD-10-CM | POA: Diagnosis not present

## 2023-03-21 DIAGNOSIS — I214 Non-ST elevation (NSTEMI) myocardial infarction: Secondary | ICD-10-CM | POA: Diagnosis not present

## 2023-03-21 DIAGNOSIS — I130 Hypertensive heart and chronic kidney disease with heart failure and stage 1 through stage 4 chronic kidney disease, or unspecified chronic kidney disease: Secondary | ICD-10-CM | POA: Diagnosis not present

## 2023-03-21 DIAGNOSIS — C7901 Secondary malignant neoplasm of right kidney and renal pelvis: Secondary | ICD-10-CM | POA: Diagnosis not present

## 2023-03-21 DIAGNOSIS — N179 Acute kidney failure, unspecified: Secondary | ICD-10-CM | POA: Diagnosis not present

## 2023-03-21 DIAGNOSIS — Z89421 Acquired absence of other right toe(s): Secondary | ICD-10-CM | POA: Diagnosis not present

## 2023-03-21 DIAGNOSIS — M86171 Other acute osteomyelitis, right ankle and foot: Secondary | ICD-10-CM | POA: Diagnosis not present

## 2023-03-22 ENCOUNTER — Telehealth: Payer: Self-pay | Admitting: Cardiovascular Disease

## 2023-03-22 ENCOUNTER — Other Ambulatory Visit (HOSPITAL_COMMUNITY): Payer: Self-pay | Admitting: Orthopaedic Surgery

## 2023-03-22 DIAGNOSIS — M868X7 Other osteomyelitis, ankle and foot: Secondary | ICD-10-CM | POA: Diagnosis not present

## 2023-03-22 DIAGNOSIS — L03116 Cellulitis of left lower limb: Secondary | ICD-10-CM | POA: Diagnosis not present

## 2023-03-22 DIAGNOSIS — I5189 Other ill-defined heart diseases: Secondary | ICD-10-CM | POA: Diagnosis not present

## 2023-03-22 DIAGNOSIS — I5042 Chronic combined systolic (congestive) and diastolic (congestive) heart failure: Secondary | ICD-10-CM | POA: Diagnosis not present

## 2023-03-22 DIAGNOSIS — C689 Malignant neoplasm of urinary organ, unspecified: Secondary | ICD-10-CM | POA: Diagnosis not present

## 2023-03-22 DIAGNOSIS — Z89421 Acquired absence of other right toe(s): Secondary | ICD-10-CM | POA: Diagnosis not present

## 2023-03-22 DIAGNOSIS — C649 Malignant neoplasm of unspecified kidney, except renal pelvis: Secondary | ICD-10-CM | POA: Diagnosis not present

## 2023-03-22 DIAGNOSIS — M86171 Other acute osteomyelitis, right ankle and foot: Secondary | ICD-10-CM | POA: Diagnosis not present

## 2023-03-22 DIAGNOSIS — C678 Malignant neoplasm of overlapping sites of bladder: Secondary | ICD-10-CM | POA: Diagnosis not present

## 2023-03-22 DIAGNOSIS — C7901 Secondary malignant neoplasm of right kidney and renal pelvis: Secondary | ICD-10-CM | POA: Diagnosis not present

## 2023-03-22 DIAGNOSIS — Z452 Encounter for adjustment and management of vascular access device: Secondary | ICD-10-CM | POA: Diagnosis not present

## 2023-03-22 DIAGNOSIS — Z4781 Encounter for orthopedic aftercare following surgical amputation: Secondary | ICD-10-CM | POA: Diagnosis not present

## 2023-03-22 DIAGNOSIS — I350 Nonrheumatic aortic (valve) stenosis: Secondary | ICD-10-CM | POA: Diagnosis not present

## 2023-03-22 DIAGNOSIS — I214 Non-ST elevation (NSTEMI) myocardial infarction: Secondary | ICD-10-CM | POA: Diagnosis not present

## 2023-03-22 DIAGNOSIS — I10 Essential (primary) hypertension: Secondary | ICD-10-CM | POA: Diagnosis not present

## 2023-03-22 DIAGNOSIS — N179 Acute kidney failure, unspecified: Secondary | ICD-10-CM | POA: Diagnosis not present

## 2023-03-22 DIAGNOSIS — I480 Paroxysmal atrial fibrillation: Secondary | ICD-10-CM | POA: Diagnosis not present

## 2023-03-22 DIAGNOSIS — I130 Hypertensive heart and chronic kidney disease with heart failure and stage 1 through stage 4 chronic kidney disease, or unspecified chronic kidney disease: Secondary | ICD-10-CM | POA: Diagnosis not present

## 2023-03-22 DIAGNOSIS — L03115 Cellulitis of right lower limb: Secondary | ICD-10-CM | POA: Diagnosis not present

## 2023-03-22 DIAGNOSIS — N184 Chronic kidney disease, stage 4 (severe): Secondary | ICD-10-CM | POA: Diagnosis not present

## 2023-03-22 NOTE — Telephone Encounter (Signed)
 Patients daughter came in with some questions regarding her dad. She wants to know does pace makes had a defibrillator in it.

## 2023-03-22 NOTE — Telephone Encounter (Signed)
 [employer] spoke with patient's daughter about cost of ceftaroline - will be $100 per week for 4 weeks. She agreed to cost and has already paid them for the antibiotics. Spoke with Amy over the phone to relay verbal order to STOP ceftriaxone and START ceftaroline with same end date of 3/19. Thanks!

## 2023-03-23 ENCOUNTER — Telehealth: Payer: Self-pay

## 2023-03-23 DIAGNOSIS — Z4781 Encounter for orthopedic aftercare following surgical amputation: Secondary | ICD-10-CM | POA: Diagnosis not present

## 2023-03-23 DIAGNOSIS — I5189 Other ill-defined heart diseases: Secondary | ICD-10-CM | POA: Diagnosis not present

## 2023-03-23 DIAGNOSIS — M868X7 Other osteomyelitis, ankle and foot: Secondary | ICD-10-CM | POA: Diagnosis not present

## 2023-03-23 DIAGNOSIS — C649 Malignant neoplasm of unspecified kidney, except renal pelvis: Secondary | ICD-10-CM | POA: Diagnosis not present

## 2023-03-23 DIAGNOSIS — Z89421 Acquired absence of other right toe(s): Secondary | ICD-10-CM | POA: Diagnosis not present

## 2023-03-23 DIAGNOSIS — I214 Non-ST elevation (NSTEMI) myocardial infarction: Secondary | ICD-10-CM | POA: Diagnosis not present

## 2023-03-23 DIAGNOSIS — I129 Hypertensive chronic kidney disease with stage 1 through stage 4 chronic kidney disease, or unspecified chronic kidney disease: Secondary | ICD-10-CM | POA: Diagnosis not present

## 2023-03-23 DIAGNOSIS — N184 Chronic kidney disease, stage 4 (severe): Secondary | ICD-10-CM | POA: Diagnosis not present

## 2023-03-23 DIAGNOSIS — N179 Acute kidney failure, unspecified: Secondary | ICD-10-CM | POA: Diagnosis not present

## 2023-03-23 DIAGNOSIS — C678 Malignant neoplasm of overlapping sites of bladder: Secondary | ICD-10-CM | POA: Diagnosis not present

## 2023-03-23 DIAGNOSIS — C679 Malignant neoplasm of bladder, unspecified: Secondary | ICD-10-CM | POA: Diagnosis not present

## 2023-03-23 DIAGNOSIS — Z1152 Encounter for screening for COVID-19: Secondary | ICD-10-CM | POA: Diagnosis not present

## 2023-03-23 DIAGNOSIS — I130 Hypertensive heart and chronic kidney disease with heart failure and stage 1 through stage 4 chronic kidney disease, or unspecified chronic kidney disease: Secondary | ICD-10-CM | POA: Diagnosis not present

## 2023-03-23 DIAGNOSIS — M86171 Other acute osteomyelitis, right ankle and foot: Secondary | ICD-10-CM | POA: Diagnosis not present

## 2023-03-23 DIAGNOSIS — G43909 Migraine, unspecified, not intractable, without status migrainosus: Secondary | ICD-10-CM | POA: Diagnosis not present

## 2023-03-23 DIAGNOSIS — C7901 Secondary malignant neoplasm of right kidney and renal pelvis: Secondary | ICD-10-CM | POA: Diagnosis not present

## 2023-03-23 DIAGNOSIS — R509 Fever, unspecified: Secondary | ICD-10-CM | POA: Diagnosis not present

## 2023-03-23 DIAGNOSIS — I480 Paroxysmal atrial fibrillation: Secondary | ICD-10-CM | POA: Diagnosis not present

## 2023-03-23 DIAGNOSIS — B349 Viral infection, unspecified: Secondary | ICD-10-CM | POA: Diagnosis not present

## 2023-03-23 DIAGNOSIS — I5042 Chronic combined systolic (congestive) and diastolic (congestive) heart failure: Secondary | ICD-10-CM | POA: Diagnosis not present

## 2023-03-23 NOTE — Telephone Encounter (Signed)
 Patient's daughter Tresa Endo called stating she is going home tomorrow and lives out of town.  She states they do not have anyone to infuse the medications for the patient twice a day.  She reports they can do once a day once she leaves tomorrow.  They are unable to afford a RN come out daily to infuse the medications.  She would like to know what other options they have.  Please advise.  Tamisha Nordstrom Jonathon Resides, CMA

## 2023-03-24 DIAGNOSIS — Z4781 Encounter for orthopedic aftercare following surgical amputation: Secondary | ICD-10-CM | POA: Diagnosis not present

## 2023-03-24 DIAGNOSIS — I130 Hypertensive heart and chronic kidney disease with heart failure and stage 1 through stage 4 chronic kidney disease, or unspecified chronic kidney disease: Secondary | ICD-10-CM | POA: Diagnosis not present

## 2023-03-24 DIAGNOSIS — N179 Acute kidney failure, unspecified: Secondary | ICD-10-CM | POA: Diagnosis not present

## 2023-03-24 DIAGNOSIS — C7901 Secondary malignant neoplasm of right kidney and renal pelvis: Secondary | ICD-10-CM | POA: Diagnosis not present

## 2023-03-24 DIAGNOSIS — I5042 Chronic combined systolic (congestive) and diastolic (congestive) heart failure: Secondary | ICD-10-CM | POA: Diagnosis not present

## 2023-03-24 DIAGNOSIS — M86171 Other acute osteomyelitis, right ankle and foot: Secondary | ICD-10-CM | POA: Diagnosis not present

## 2023-03-24 DIAGNOSIS — C678 Malignant neoplasm of overlapping sites of bladder: Secondary | ICD-10-CM | POA: Diagnosis not present

## 2023-03-24 DIAGNOSIS — I214 Non-ST elevation (NSTEMI) myocardial infarction: Secondary | ICD-10-CM | POA: Diagnosis not present

## 2023-03-24 DIAGNOSIS — Z89421 Acquired absence of other right toe(s): Secondary | ICD-10-CM | POA: Diagnosis not present

## 2023-03-24 LAB — LAB REPORT - SCANNED: EGFR: 21

## 2023-03-24 NOTE — Telephone Encounter (Signed)
 Istvan (son) called back stating all family including patient would like to stop ceftaroline and restart ceftriaxone once daily. Agree that MRSA coverage will be lacking and that will be appropriate given no known history of MRSA. Updated Amy with new verbal orders.

## 2023-03-24 NOTE — Telephone Encounter (Signed)
 Spoke with Tresa Endo who connected me with her brother Jordan Oconnell [phone]). He would like to stop ceftaroline, absorb the cost they spent on it, and start ceftriaxone. He's confirming plan with family now. Updated Amy on the plan and will await Sten's call back.

## 2023-03-24 NOTE — Telephone Encounter (Signed)
 I'm not sure how to reach the daughter Tresa Endo directly, but I spoke with Amy at [employer] to confirm the daughter already paid for a week of the ceftaroline (through 3/5). We were going to see if Tresa Endo could have the person coming once a day infuse the first dose and then activate/prepare the second dose of ceftaroline until this supply ran out then switch to ceftriaxone for remainder of treatment. This would mean Carla would need to still flush with saline x 2 and heparin along with prime the line before starting the infusion. Not sure if this is something he would be capable/able to do. Does any have Kelly's direct # we could call to ask about this?  Otherwise, yeah, sounds like she will have to deal with some loss of the ceftaroline that is already paid for and just start the ceftriaxone once daily. Given all of this difficulty, I would say it would be wiser to not cover MRSA empirically rather than start vancomycin in my personal opinion. Marchelle Folks

## 2023-03-25 ENCOUNTER — Other Ambulatory Visit (HOSPITAL_COMMUNITY): Payer: Self-pay

## 2023-03-25 DIAGNOSIS — I130 Hypertensive heart and chronic kidney disease with heart failure and stage 1 through stage 4 chronic kidney disease, or unspecified chronic kidney disease: Secondary | ICD-10-CM | POA: Diagnosis not present

## 2023-03-25 DIAGNOSIS — Z89421 Acquired absence of other right toe(s): Secondary | ICD-10-CM | POA: Diagnosis not present

## 2023-03-25 DIAGNOSIS — M86171 Other acute osteomyelitis, right ankle and foot: Secondary | ICD-10-CM | POA: Diagnosis not present

## 2023-03-25 DIAGNOSIS — C7901 Secondary malignant neoplasm of right kidney and renal pelvis: Secondary | ICD-10-CM | POA: Diagnosis not present

## 2023-03-25 DIAGNOSIS — Z4781 Encounter for orthopedic aftercare following surgical amputation: Secondary | ICD-10-CM | POA: Diagnosis not present

## 2023-03-25 DIAGNOSIS — C678 Malignant neoplasm of overlapping sites of bladder: Secondary | ICD-10-CM | POA: Diagnosis not present

## 2023-03-25 DIAGNOSIS — N179 Acute kidney failure, unspecified: Secondary | ICD-10-CM | POA: Diagnosis not present

## 2023-03-25 DIAGNOSIS — I214 Non-ST elevation (NSTEMI) myocardial infarction: Secondary | ICD-10-CM | POA: Diagnosis not present

## 2023-03-25 DIAGNOSIS — I5042 Chronic combined systolic (congestive) and diastolic (congestive) heart failure: Secondary | ICD-10-CM | POA: Diagnosis not present

## 2023-03-25 NOTE — Telephone Encounter (Signed)
 Called pt's daughter, Tresa Endo back. Made her aware that we do not have a DPR to talk to her regarding pt's medical history. She requests that I give her dad a call to have a discussion about his pacemaker.

## 2023-03-25 NOTE — Telephone Encounter (Signed)
 Called pt regarding his pacemaker. Patient identification verified by 2 forms. Informed pt that he only has a pacemaker and he does not have ICD capabilities. Pt informs [place] that he is seeing Infectious disease after recently needing a toe amputation. Pt also inquires about follow up visit with Dr. Salena Saner, advised pt that he is not due to come back until September as long has he is doing ok. Pt has no further questions at this time.

## 2023-03-26 DIAGNOSIS — M869 Osteomyelitis, unspecified: Secondary | ICD-10-CM | POA: Diagnosis not present

## 2023-03-26 DIAGNOSIS — N179 Acute kidney failure, unspecified: Secondary | ICD-10-CM | POA: Diagnosis not present

## 2023-03-26 DIAGNOSIS — N184 Chronic kidney disease, stage 4 (severe): Secondary | ICD-10-CM | POA: Diagnosis not present

## 2023-03-28 DIAGNOSIS — C7901 Secondary malignant neoplasm of right kidney and renal pelvis: Secondary | ICD-10-CM | POA: Diagnosis not present

## 2023-03-28 DIAGNOSIS — I130 Hypertensive heart and chronic kidney disease with heart failure and stage 1 through stage 4 chronic kidney disease, or unspecified chronic kidney disease: Secondary | ICD-10-CM | POA: Diagnosis not present

## 2023-03-28 DIAGNOSIS — Z89421 Acquired absence of other right toe(s): Secondary | ICD-10-CM | POA: Diagnosis not present

## 2023-03-28 DIAGNOSIS — N179 Acute kidney failure, unspecified: Secondary | ICD-10-CM | POA: Diagnosis not present

## 2023-03-28 DIAGNOSIS — I214 Non-ST elevation (NSTEMI) myocardial infarction: Secondary | ICD-10-CM | POA: Diagnosis not present

## 2023-03-28 DIAGNOSIS — Z4781 Encounter for orthopedic aftercare following surgical amputation: Secondary | ICD-10-CM | POA: Diagnosis not present

## 2023-03-28 DIAGNOSIS — I5042 Chronic combined systolic (congestive) and diastolic (congestive) heart failure: Secondary | ICD-10-CM | POA: Diagnosis not present

## 2023-03-28 DIAGNOSIS — M86171 Other acute osteomyelitis, right ankle and foot: Secondary | ICD-10-CM | POA: Diagnosis not present

## 2023-03-28 DIAGNOSIS — C678 Malignant neoplasm of overlapping sites of bladder: Secondary | ICD-10-CM | POA: Diagnosis not present

## 2023-03-29 DIAGNOSIS — C678 Malignant neoplasm of overlapping sites of bladder: Secondary | ICD-10-CM | POA: Diagnosis not present

## 2023-03-29 DIAGNOSIS — C7901 Secondary malignant neoplasm of right kidney and renal pelvis: Secondary | ICD-10-CM | POA: Diagnosis not present

## 2023-03-29 DIAGNOSIS — I130 Hypertensive heart and chronic kidney disease with heart failure and stage 1 through stage 4 chronic kidney disease, or unspecified chronic kidney disease: Secondary | ICD-10-CM | POA: Diagnosis not present

## 2023-03-29 DIAGNOSIS — M86171 Other acute osteomyelitis, right ankle and foot: Secondary | ICD-10-CM | POA: Diagnosis not present

## 2023-03-29 DIAGNOSIS — Z4781 Encounter for orthopedic aftercare following surgical amputation: Secondary | ICD-10-CM | POA: Diagnosis not present

## 2023-03-29 DIAGNOSIS — I214 Non-ST elevation (NSTEMI) myocardial infarction: Secondary | ICD-10-CM | POA: Diagnosis not present

## 2023-03-29 DIAGNOSIS — N179 Acute kidney failure, unspecified: Secondary | ICD-10-CM | POA: Diagnosis not present

## 2023-03-29 DIAGNOSIS — Z452 Encounter for adjustment and management of vascular access device: Secondary | ICD-10-CM | POA: Diagnosis not present

## 2023-03-29 DIAGNOSIS — I5042 Chronic combined systolic (congestive) and diastolic (congestive) heart failure: Secondary | ICD-10-CM | POA: Diagnosis not present

## 2023-03-29 DIAGNOSIS — Z89421 Acquired absence of other right toe(s): Secondary | ICD-10-CM | POA: Diagnosis not present

## 2023-03-29 NOTE — Progress Notes (Deleted)
 Subjective:    Patient ID: Jordan Oconnell, male    DOB: 11-15-1940, 83 y.o.   MRN: [id number]  HPI  Jordan Oconnell is a 83 y.o. 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   Past Medical History:  Diagnosis Date   Bladder cancer (HCC)    BPH (benign prostatic hyperplasia)    CKD (chronic kidney disease), stage IV (HCC)    Colon polyps    Diverticulosis    GERD (gastroesophageal reflux disease)    Hyperlipidemia    pt denies   Hypertension    PAF (paroxysmal atrial fibrillation) (HCC)    on Flecanide and diltiazem. 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 Excell Seltzer and Dr. Laneta Simmers   Severe aortic stenosis 10/05/2020    Past Surgical History:  Procedure Laterality Date   AMPUTATION TOE Right 03/02/2023   Procedure: AMPUTATION TOE, 4th;  Surgeon: Netta Cedars, MD;  Location: [facility] ORS;  Service: Orthopedics;  Laterality: Right;   APPENDECTOMY     BLADDER SURGERY  2008   transurethral resection/resection of prostatic urethra   BOWEL RESECTION     CARDIOVERSION N/A 03/03/2022   Procedure: CARDIOVERSION;  Surgeon: Jodelle Red, MD;  Location: [facility] ENDOSCOPY;  Service: Cardiovascular;  Laterality: N/A;   CARDIOVERSION N/A 03/12/2022   Procedure: CARDIOVERSION;  Surgeon: Thomasene Ripple, DO;  Location: [facility] ENDOSCOPY;  Service: Cardiovascular;  Laterality: N/A;   ESOPHAGOGASTRODUODENOSCOPY  N/A 07/24/2013   Procedure: ESOPHAGOGASTRODUODENOSCOPY (EGD);  Surgeon: Hilarie Fredrickson, MD;  Location: [facility] ENDOSCOPY;  Service: Endoscopy;  Laterality: N/A;   INTRAOPERATIVE TRANSTHORACIC ECHOCARDIOGRAM N/A 01/27/2021   Procedure: INTRAOPERATIVE TRANSTHORACIC ECHOCARDIOGRAM;  Surgeon: Tonny Bollman, MD;  Location: [facility] INVASIVE CV LAB;  Service: Open Heart Surgery;  Laterality: N/A;   IR FLUORO GUIDE CV LINE RIGHT  03/04/2023   IR US GUIDE VASC ACCESS RIGHT  03/04/2023   laparoscopic surgery  2012   laser   (?)   NEPHRECTOMY  2007   partial, left   NEPHROSTOMY  2011   stent   NM MYOCAR PERF WALL MOTION  09/08/2010   Normal   PACEMAKER IMPLANT N/A 02/12/2021   Procedure: PACEMAKER IMPLANT;  Surgeon: Lanier Prude, MD;  Location: [facility] INVASIVE CV LAB;  Service: Cardiovascular;  Laterality: N/A;   RIGHT HEART CATH AND CORONARY ANGIOGRAPHY N/A 10/06/2020   Procedure: RIGHT HEART CATH AND CORONARY ANGIOGRAPHY;  Surgeon: Tonny Bollman, MD;  Location: [facility] INVASIVE CV LAB;  Service: Cardiovascular;  Laterality: N/A;   ROBOT ASSISTED LAPAROSCOPIC COMPLETE CYSTECT ILEAL CONDUIT     TRANSCATHETER AORTIC VALVE REPLACEMENT, TRANSFEMORAL Right 01/27/2021   Procedure: TRANSCATHETER AORTIC VALVE REPLACEMENT, TRANSFEMORAL;  Surgeon: Tonny Bollman, MD;  Location: [facility] INVASIVE CV LAB;  Service: Open Heart Surgery;  Laterality: Right;   US ECHOCARDIOGRAPHY  09/08/2010   mild diastolic dysfunction,mild dilated LA,mild MR,AI,mildly dilated aortic root    Family History  Problem Relation Age of Onset   Ovarian cancer Mother  Colon cancer Mother    Colon cancer Father    Crohn's disease Son    Breast cancer Daughter    Colon cancer Brother       Social History   Socioeconomic History   Marital status: Divorced    Spouse name: Not on file   Number of children: 2   Years of education: Not on file   Highest education level: Not on file  Occupational History   Occupation: Runner, broadcasting/film/video  Tobacco Use   Smoking  status: Never   Smokeless tobacco: Never  Vaping Use   Vaping status: Never Used  Substance and Sexual Activity   Alcohol use: Yes    Comment: occasionally   Drug use: No   Sexual activity: Not on file  Other Topics Concern   Not on file  Social History Narrative   Not on file   Social Drivers of Health   Financial Resource Strain: Not on file  Food Insecurity: No Food Insecurity (03/01/2023)   Hunger Vital Sign    Worried About Running Out of Food in the Last Year: Never true    Ran Out of Food in the Last Year: Never true  Transportation Needs: No Transportation Needs (03/01/2023)   PRAPARE - Administrator, Civil Service (Medical): No    Lack of Transportation (Non-Medical): No  Physical Activity: Not on file  Stress: Not on file  Social Connections: Moderately Isolated (03/12/2023)   Social Connection and Isolation Panel [NHANES]    Frequency of Communication with Friends and Family: More than three times a week    Frequency of Social Gatherings with Friends and Family: Once a week    Attends Religious Services: Never    Database administrator or Organizations: No    Attends Engineer, structural: Never    Marital Status: Living with partner    Allergies  Allergen Reactions   Amoxicillin Other (See Comments)    Acute interstitial nephritis  Other Reaction(s): Not available   Tamsulosin     Other Reaction(s): Not available   Doxycycline Hives    Other Reaction(s): Not available   Flomax [Tamsulosin Hcl] Other (See Comments)    Dizzy      Current Outpatient Medications:    acetaminophen (TYLENOL) 500 MG tablet, Take 1,000-1,500 mg by mouth 2 (two) times daily as needed for moderate pain (pain score 4-6)., Disp: , Rfl:    allopurinol (ZYLOPRIM) 100 MG tablet, Take 1 tablet (100 mg total) by mouth daily., Disp: 30 tablet, Rfl: 0   ALPRAZolam (XANAX) 0.25 MG tablet, Take 0.25 mg by mouth at bedtime., Disp: , Rfl:    amiodarone (PACERONE) 200 MG  tablet, Take 1 tablet (200 mg total) by mouth daily., Disp: 90 tablet, Rfl: 1   amLODipine (NORVASC) 10 MG tablet, Take 1 tablet (10 mg total) by mouth daily., Disp: 90 tablet, Rfl: 0   aspirin EC 81 MG tablet, Take 1 tablet (81 mg total) by mouth daily. Swallow whole., Disp: 90 tablet, Rfl: 0   atorvastatin (LIPITOR) 10 MG tablet, Take 1 tablet (10 mg total) by mouth daily., Disp: 90 tablet, Rfl: 0   azithromycin (ZITHROMAX) 500 MG tablet, TAKE 1 TABLET BY MOUTH ONCE DAILY 1  HOUR  BEFORE  DENTAL  PROCEDURES  AND  CLEANINGS, Disp: 12 tablet, Rfl: 1   carvedilol (COREG) 12.5 MG tablet, Take 12.5 mg by mouth 2 (two) times daily., Disp: , Rfl:    cefTRIAXone (ROCEPHIN) IVPB, Inject 2  g into the vein daily. Indication:  osteomyelitis of right foot  First Dose: Yes Last Day of Therapy:  04/13/23 Labs - Once weekly:  CBC/D and BMP, Labs - Once weekly: ESR and CRP Method of administration: IV Push Method of administration may be changed at the discretion of home infusion pharmacist based upon assessment of the patient and/or caregiver's ability to self-administer the medication ordered., Disp: 18 Units, Rfl: 0   cyanocobalamin 1000 MCG tablet, Take 1 tablet (1,000 mcg total) by mouth daily., Disp: 90 tablet, Rfl: 0   daptomycin (CUBICIN) IVPB, Inject 700 mg into the vein every other day. Indication:  Osteomyelitis of right foot  First Dose: Yes Last Day of Therapy:  04/13/23 Labs - Once weekly:  CBC/D, BMP, and CPK Labs - Once weekly: ESR and CRP Method of administration: IV Push Method of administration may be changed at the discretion of home infusion pharmacist based upon assessment of the patient and/or caregiver's ability to self-administer the medication ordered., Disp: 37 Units, Rfl: 0   ELIQUIS 2.5 MG TABS tablet, Take 1 tablet (2.5 mg total) by mouth 2 (two) times daily., Disp: 60 tablet, Rfl: 5   ferrous sulfate 325 (65 FE) MG EC tablet, Take 1 tablet (325 mg total) by mouth in the morning and at  bedtime., Disp: 180 tablet, Rfl: 3   hydrALAZINE (APRESOLINE) 25 MG tablet, Take 1 tablet (25 mg total) by mouth 2 (two) times daily., Disp: 180 tablet, Rfl: 0   isosorbide mononitrate (IMDUR) 30 MG 24 hr tablet, Take 1 tablet (30 mg total) by mouth daily., Disp: 90 tablet, Rfl: 0   levothyroxine (SYNTHROID) 75 MCG tablet, Take 75 mcg by mouth daily before breakfast., Disp: , Rfl:    mirtazapine (REMERON) 15 MG tablet, Take 7.5 mg by mouth at bedtime., Disp: , Rfl:    nitroGLYCERIN (NITROSTAT) 0.4 MG SL tablet, Place 1 tablet (0.4 mg total) under the tongue every 5 (five) minutes as needed for chest pain., Disp: 25 tablet, Rfl: 0   oxyCODONE (OXY IR/ROXICODONE) 5 MG immediate release tablet, Take 1 tablet (5 mg total) by mouth every 6 (six) hours as needed for moderate pain (pain score 4-6)., Disp: 20 tablet, Rfl: 0   sodium bicarbonate 650 MG tablet, Take 1,300 mg by mouth 2 (two) times daily., Disp: , Rfl:    Review of Systems     Objective:   Physical Exam        Assessment & Plan:

## 2023-03-30 ENCOUNTER — Ambulatory Visit: Payer: Medicare PPO | Admitting: Infectious Disease

## 2023-03-30 ENCOUNTER — Other Ambulatory Visit (HOSPITAL_COMMUNITY): Payer: Self-pay

## 2023-03-30 LAB — LAB REPORT - SCANNED: EGFR: 26

## 2023-03-31 DIAGNOSIS — I130 Hypertensive heart and chronic kidney disease with heart failure and stage 1 through stage 4 chronic kidney disease, or unspecified chronic kidney disease: Secondary | ICD-10-CM | POA: Diagnosis not present

## 2023-03-31 DIAGNOSIS — M869 Osteomyelitis, unspecified: Secondary | ICD-10-CM | POA: Diagnosis not present

## 2023-03-31 DIAGNOSIS — C678 Malignant neoplasm of overlapping sites of bladder: Secondary | ICD-10-CM | POA: Diagnosis not present

## 2023-03-31 DIAGNOSIS — I5042 Chronic combined systolic (congestive) and diastolic (congestive) heart failure: Secondary | ICD-10-CM | POA: Diagnosis not present

## 2023-03-31 DIAGNOSIS — Z89421 Acquired absence of other right toe(s): Secondary | ICD-10-CM | POA: Diagnosis not present

## 2023-03-31 DIAGNOSIS — N179 Acute kidney failure, unspecified: Secondary | ICD-10-CM | POA: Diagnosis not present

## 2023-03-31 DIAGNOSIS — I214 Non-ST elevation (NSTEMI) myocardial infarction: Secondary | ICD-10-CM | POA: Diagnosis not present

## 2023-03-31 DIAGNOSIS — Z4781 Encounter for orthopedic aftercare following surgical amputation: Secondary | ICD-10-CM | POA: Diagnosis not present

## 2023-03-31 DIAGNOSIS — M86171 Other acute osteomyelitis, right ankle and foot: Secondary | ICD-10-CM | POA: Diagnosis not present

## 2023-03-31 DIAGNOSIS — N184 Chronic kidney disease, stage 4 (severe): Secondary | ICD-10-CM | POA: Diagnosis not present

## 2023-03-31 DIAGNOSIS — C7901 Secondary malignant neoplasm of right kidney and renal pelvis: Secondary | ICD-10-CM | POA: Diagnosis not present

## 2023-04-02 DIAGNOSIS — N179 Acute kidney failure, unspecified: Secondary | ICD-10-CM | POA: Diagnosis not present

## 2023-04-02 DIAGNOSIS — M869 Osteomyelitis, unspecified: Secondary | ICD-10-CM | POA: Diagnosis not present

## 2023-04-02 DIAGNOSIS — N184 Chronic kidney disease, stage 4 (severe): Secondary | ICD-10-CM | POA: Diagnosis not present

## 2023-04-04 NOTE — Progress Notes (Signed)
 Remote pacemaker transmission.

## 2023-04-05 DIAGNOSIS — Z452 Encounter for adjustment and management of vascular access device: Secondary | ICD-10-CM | POA: Diagnosis not present

## 2023-04-05 DIAGNOSIS — I130 Hypertensive heart and chronic kidney disease with heart failure and stage 1 through stage 4 chronic kidney disease, or unspecified chronic kidney disease: Secondary | ICD-10-CM | POA: Diagnosis not present

## 2023-04-05 DIAGNOSIS — Z4781 Encounter for orthopedic aftercare following surgical amputation: Secondary | ICD-10-CM | POA: Diagnosis not present

## 2023-04-05 DIAGNOSIS — M86171 Other acute osteomyelitis, right ankle and foot: Secondary | ICD-10-CM | POA: Diagnosis not present

## 2023-04-05 DIAGNOSIS — C678 Malignant neoplasm of overlapping sites of bladder: Secondary | ICD-10-CM | POA: Diagnosis not present

## 2023-04-05 DIAGNOSIS — C7901 Secondary malignant neoplasm of right kidney and renal pelvis: Secondary | ICD-10-CM | POA: Diagnosis not present

## 2023-04-05 DIAGNOSIS — N179 Acute kidney failure, unspecified: Secondary | ICD-10-CM | POA: Diagnosis not present

## 2023-04-05 DIAGNOSIS — Z89421 Acquired absence of other right toe(s): Secondary | ICD-10-CM | POA: Diagnosis not present

## 2023-04-05 DIAGNOSIS — I5042 Chronic combined systolic (congestive) and diastolic (congestive) heart failure: Secondary | ICD-10-CM | POA: Diagnosis not present

## 2023-04-05 DIAGNOSIS — I214 Non-ST elevation (NSTEMI) myocardial infarction: Secondary | ICD-10-CM | POA: Diagnosis not present

## 2023-04-06 DIAGNOSIS — Z1339 Encounter for screening examination for other mental health and behavioral disorders: Secondary | ICD-10-CM | POA: Diagnosis not present

## 2023-04-06 DIAGNOSIS — C679 Malignant neoplasm of bladder, unspecified: Secondary | ICD-10-CM | POA: Diagnosis not present

## 2023-04-06 DIAGNOSIS — S98139A Complete traumatic amputation of one unspecified lesser toe, initial encounter: Secondary | ICD-10-CM | POA: Diagnosis not present

## 2023-04-06 DIAGNOSIS — D6869 Other thrombophilia: Secondary | ICD-10-CM | POA: Diagnosis not present

## 2023-04-06 DIAGNOSIS — N184 Chronic kidney disease, stage 4 (severe): Secondary | ICD-10-CM | POA: Diagnosis not present

## 2023-04-06 DIAGNOSIS — I10 Essential (primary) hypertension: Secondary | ICD-10-CM | POA: Diagnosis not present

## 2023-04-06 DIAGNOSIS — R82998 Other abnormal findings in urine: Secondary | ICD-10-CM | POA: Diagnosis not present

## 2023-04-06 DIAGNOSIS — E039 Hypothyroidism, unspecified: Secondary | ICD-10-CM | POA: Diagnosis not present

## 2023-04-06 DIAGNOSIS — Z1331 Encounter for screening for depression: Secondary | ICD-10-CM | POA: Diagnosis not present

## 2023-04-06 DIAGNOSIS — C649 Malignant neoplasm of unspecified kidney, except renal pelvis: Secondary | ICD-10-CM | POA: Diagnosis not present

## 2023-04-06 DIAGNOSIS — I480 Paroxysmal atrial fibrillation: Secondary | ICD-10-CM | POA: Diagnosis not present

## 2023-04-06 DIAGNOSIS — Z936 Other artificial openings of urinary tract status: Secondary | ICD-10-CM | POA: Diagnosis not present

## 2023-04-06 DIAGNOSIS — M868X8 Other osteomyelitis, other site: Secondary | ICD-10-CM | POA: Diagnosis not present

## 2023-04-06 LAB — LAB REPORT - SCANNED: EGFR: 27

## 2023-04-07 DIAGNOSIS — I214 Non-ST elevation (NSTEMI) myocardial infarction: Secondary | ICD-10-CM | POA: Diagnosis not present

## 2023-04-07 DIAGNOSIS — C678 Malignant neoplasm of overlapping sites of bladder: Secondary | ICD-10-CM | POA: Diagnosis not present

## 2023-04-07 DIAGNOSIS — M86171 Other acute osteomyelitis, right ankle and foot: Secondary | ICD-10-CM | POA: Diagnosis not present

## 2023-04-07 DIAGNOSIS — Z4781 Encounter for orthopedic aftercare following surgical amputation: Secondary | ICD-10-CM | POA: Diagnosis not present

## 2023-04-07 DIAGNOSIS — N179 Acute kidney failure, unspecified: Secondary | ICD-10-CM | POA: Diagnosis not present

## 2023-04-07 DIAGNOSIS — Z89421 Acquired absence of other right toe(s): Secondary | ICD-10-CM | POA: Diagnosis not present

## 2023-04-07 DIAGNOSIS — C7901 Secondary malignant neoplasm of right kidney and renal pelvis: Secondary | ICD-10-CM | POA: Diagnosis not present

## 2023-04-07 DIAGNOSIS — I130 Hypertensive heart and chronic kidney disease with heart failure and stage 1 through stage 4 chronic kidney disease, or unspecified chronic kidney disease: Secondary | ICD-10-CM | POA: Diagnosis not present

## 2023-04-07 DIAGNOSIS — I5042 Chronic combined systolic (congestive) and diastolic (congestive) heart failure: Secondary | ICD-10-CM | POA: Diagnosis not present

## 2023-04-09 DIAGNOSIS — M869 Osteomyelitis, unspecified: Secondary | ICD-10-CM | POA: Diagnosis not present

## 2023-04-09 DIAGNOSIS — N184 Chronic kidney disease, stage 4 (severe): Secondary | ICD-10-CM | POA: Diagnosis not present

## 2023-04-09 DIAGNOSIS — N179 Acute kidney failure, unspecified: Secondary | ICD-10-CM | POA: Diagnosis not present

## 2023-04-11 ENCOUNTER — Ambulatory Visit: Admitting: Infectious Disease

## 2023-04-11 DIAGNOSIS — I130 Hypertensive heart and chronic kidney disease with heart failure and stage 1 through stage 4 chronic kidney disease, or unspecified chronic kidney disease: Secondary | ICD-10-CM | POA: Diagnosis not present

## 2023-04-11 DIAGNOSIS — Z4781 Encounter for orthopedic aftercare following surgical amputation: Secondary | ICD-10-CM | POA: Diagnosis not present

## 2023-04-11 DIAGNOSIS — N179 Acute kidney failure, unspecified: Secondary | ICD-10-CM | POA: Diagnosis not present

## 2023-04-11 DIAGNOSIS — I5042 Chronic combined systolic (congestive) and diastolic (congestive) heart failure: Secondary | ICD-10-CM | POA: Diagnosis not present

## 2023-04-11 DIAGNOSIS — Z89421 Acquired absence of other right toe(s): Secondary | ICD-10-CM | POA: Diagnosis not present

## 2023-04-11 DIAGNOSIS — M86171 Other acute osteomyelitis, right ankle and foot: Secondary | ICD-10-CM | POA: Diagnosis not present

## 2023-04-11 DIAGNOSIS — I214 Non-ST elevation (NSTEMI) myocardial infarction: Secondary | ICD-10-CM | POA: Diagnosis not present

## 2023-04-11 DIAGNOSIS — C7901 Secondary malignant neoplasm of right kidney and renal pelvis: Secondary | ICD-10-CM | POA: Diagnosis not present

## 2023-04-11 DIAGNOSIS — C678 Malignant neoplasm of overlapping sites of bladder: Secondary | ICD-10-CM | POA: Diagnosis not present

## 2023-04-12 ENCOUNTER — Telehealth: Payer: Self-pay

## 2023-04-12 DIAGNOSIS — Z4781 Encounter for orthopedic aftercare following surgical amputation: Secondary | ICD-10-CM | POA: Diagnosis not present

## 2023-04-12 DIAGNOSIS — Z89421 Acquired absence of other right toe(s): Secondary | ICD-10-CM | POA: Diagnosis not present

## 2023-04-12 DIAGNOSIS — Z452 Encounter for adjustment and management of vascular access device: Secondary | ICD-10-CM | POA: Diagnosis not present

## 2023-04-12 DIAGNOSIS — C7901 Secondary malignant neoplasm of right kidney and renal pelvis: Secondary | ICD-10-CM | POA: Diagnosis not present

## 2023-04-12 DIAGNOSIS — I214 Non-ST elevation (NSTEMI) myocardial infarction: Secondary | ICD-10-CM | POA: Diagnosis not present

## 2023-04-12 DIAGNOSIS — I130 Hypertensive heart and chronic kidney disease with heart failure and stage 1 through stage 4 chronic kidney disease, or unspecified chronic kidney disease: Secondary | ICD-10-CM | POA: Diagnosis not present

## 2023-04-12 DIAGNOSIS — M86171 Other acute osteomyelitis, right ankle and foot: Secondary | ICD-10-CM | POA: Diagnosis not present

## 2023-04-12 DIAGNOSIS — C678 Malignant neoplasm of overlapping sites of bladder: Secondary | ICD-10-CM | POA: Diagnosis not present

## 2023-04-12 DIAGNOSIS — N179 Acute kidney failure, unspecified: Secondary | ICD-10-CM | POA: Diagnosis not present

## 2023-04-12 DIAGNOSIS — I5042 Chronic combined systolic (congestive) and diastolic (congestive) heart failure: Secondary | ICD-10-CM | POA: Diagnosis not present

## 2023-04-12 NOTE — Telephone Encounter (Signed)
 Patient called stating he was unsure if he should continue IV abx until seen on Friday 04/15/23 or do you want to keep the original end date per OPAT note on 04/13/23. Patient reports if he stops the IV abx on 04/13/23 he will still have 3 doses leftover, but reports no missed doses.  Please advise.  Dania Marsan Jonathon Resides, CMA

## 2023-04-13 NOTE — Telephone Encounter (Signed)
 Please let patient know to continue IV abx until Friday when he comes to see Dr. Daiva Eves.  I attempted to reach the patient.  No answer LVM to call back

## 2023-04-13 NOTE — Telephone Encounter (Signed)
 Patient returned call, notified him that provider would like him to continue with IV antibiotics until his appointment with [place] on 3/21. Patient verbalized understanding and has no further questions.   Sandie Ano, RN

## 2023-04-14 NOTE — Progress Notes (Unsigned)
 Subjective:   Chief Complaint  Patient presents with   Follow-up   for osteomyelitis of toe   Patient ID: Jordan Oconnell, male    DOB: 1940-07-08, 83 y.o.   MRN: [id number]  HPI   83 y.o. 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   Discussed the use of AI scribe software for clinical note transcription with the patient, who gave verbal consent to proceed.  History of Present Illness   The patient, with a history of a recent toe amputation due to osteomyelitis  presents for a follow-up visit. He has completed his course of antibiotics via a central line. The patient reports no pain and has been able to walk a mile a day. He also mentions a problem with the same toe on the other foot, which started hurting briefly while he was in the hospital. The patient denies any known injury to this toe. An MRI has been scheduled to further evaluate this issue. The patient also mentions a history of gout.   LABS Erythrocyte Sedimentation Rate (ESR): - 42 mm/hr (03/15/2023) - 32 mm/hr (03/22/2023) - 29 mm/hr (03/29/2023) - 29 mm/hr (04/05/2023) - 37 mm/hr (04/12/2023)  C-Reactive Protein (CRP): - 83 mg/L (03/15/2023) - 47 mg/L (03/22/2023) - 21 mg/L (03/29/2023) - 29 mg/L (04/05/2023) - 34 mg/L (04/12/2023)       Past Medical History:  Diagnosis Date   Bladder cancer (HCC)    BPH (benign prostatic hyperplasia)    CKD (chronic kidney disease), stage IV (HCC)    Colon polyps    Diverticulosis    GERD (gastroesophageal reflux disease)    Hyperlipidemia    pt denies   Hypertension    PAF  (paroxysmal atrial fibrillation) (HCC)    on Flecanide and diltiazem. 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 Excell Seltzer and Dr. Laneta Simmers   Severe aortic stenosis 10/05/2020    Past Surgical History:  Procedure Laterality Date   AMPUTATION TOE Right 03/02/2023   Procedure: AMPUTATION TOE, 4th;  Surgeon: Netta Cedars, MD;  Location: [facility] ORS;  Service: Orthopedics;  Laterality: Right;   APPENDECTOMY     BLADDER SURGERY  2008   transurethral resection/resection of prostatic urethra   BOWEL RESECTION     CARDIOVERSION N/A 03/03/2022   Procedure: CARDIOVERSION;  Surgeon: Jodelle Red, MD;  Location: [facility] ENDOSCOPY;  Service: Cardiovascular;  Laterality: N/A;   CARDIOVERSION N/A 03/12/2022   Procedure: CARDIOVERSION;  Surgeon: Thomasene Ripple, DO;  Location: [facility] ENDOSCOPY;  Service: Cardiovascular;  Laterality: N/A;   ESOPHAGOGASTRODUODENOSCOPY N/A 07/24/2013   Procedure: ESOPHAGOGASTRODUODENOSCOPY (EGD);  Surgeon: Hilarie Fredrickson, MD;  Location: [facility]OSCOPY;  Service: Endoscopy;  Laterality: N/A;   INTRAOPERATIVE TRANSTHORACIC ECHOCARDIOGRAM N/A 01/27/2021   Procedure: INTRAOPERATIVE TRANSTHORACIC ECHOCARDIOGRAM;  Surgeon: Tonny Bollman, MD;  Location: [facility]VASIVE CV LAB;  Service: Open Heart Surgery;  Laterality: N/A;   IR FLUORO GUIDE CV LINE RIGHT  03/04/2023   IR US GUIDE VASC ACCESS  RIGHT  03/04/2023   laparoscopic surgery  2012   laser   (?)   NEPHRECTOMY  2007   partial, left   NEPHROSTOMY  2011   stent   NM MYOCAR PERF WALL MOTION  09/08/2010   Normal   PACEMAKER IMPLANT N/A 02/12/2021   Procedure: PACEMAKER IMPLANT;  Surgeon: Lanier Prude, MD;  Location: [facility] INVASIVE CV LAB;  Service: Cardiovascular;  Laterality: N/A;   RIGHT HEART CATH AND CORONARY ANGIOGRAPHY N/A 10/06/2020    Procedure: RIGHT HEART CATH AND CORONARY ANGIOGRAPHY;  Surgeon: Tonny Bollman, MD;  Location: [facility] INVASIVE CV LAB;  Service: Cardiovascular;  Laterality: N/A;   ROBOT ASSISTED LAPAROSCOPIC COMPLETE CYSTECT ILEAL CONDUIT     TRANSCATHETER AORTIC VALVE REPLACEMENT, TRANSFEMORAL Right 01/27/2021   Procedure: TRANSCATHETER AORTIC VALVE REPLACEMENT, TRANSFEMORAL;  Surgeon: Tonny Bollman, MD;  Location: [facility] INVASIVE CV LAB;  Service: Open Heart Surgery;  Laterality: Right;   US ECHOCARDIOGRAPHY  09/08/2010   mild diastolic dysfunction,mild dilated LA,mild MR,AI,mildly dilated aortic root    Family History  Problem Relation Age of Onset   Ovarian cancer Mother    Colon cancer Mother    Colon cancer Father    Crohn's disease Son    Breast cancer Daughter    Colon cancer Brother       Social History   Socioeconomic History   Marital status: Divorced    Spouse name: Not on file   Number of children: 2   Years of education: Not on file   Highest education level: Not on file  Occupational History   Occupation: Runner, broadcasting/film/video  Tobacco Use   Smoking status: Never   Smokeless tobacco: Never  Vaping Use   Vaping status: Never Used  Substance and Sexual Activity   Alcohol use: Yes    Comment: occasionally   Drug use: No   Sexual activity: Not on file  Other Topics Concern   Not on file  Social History Narrative   Not on file   Social Drivers of Health   Financial Resource Strain: Not on file  Food Insecurity: No Food Insecurity (03/01/2023)   Hunger Vital Sign    Worried About Running Out of Food in the Last Year: Never true    Ran Out of Food in the Last Year: Never true  Transportation Needs: No Transportation Needs (03/01/2023)   PRAPARE - Administrator, Civil Service (Medical): No    Lack of Transportation (Non-Medical): No  Physical Activity: Not on file  Stress: Not on file  Social Connections: Moderately Isolated (03/12/2023)   Social Connection and Isolation Panel  [NHANES]    Frequency of Communication with Friends and Family: More than three times a week    Frequency of Social Gatherings with Friends and Family: Once a week    Attends Religious Services: Never    Database administrator or Organizations: No    Attends Engineer, structural: Never    Marital Status: Living with partner    Allergies  Allergen Reactions   Amoxicillin Other (See Comments)    Acute interstitial nephritis  Other Reaction(s): Not available   Tamsulosin     Other Reaction(s): Not available   Doxycycline Hives    Other Reaction(s): Not available   Flomax [Tamsulosin Hcl] Other (See Comments)    Dizzy      Current Outpatient Medications:    acetaminophen (TYLENOL) 500 MG tablet, Take 1,000-1,500 mg by mouth 2 (two) times daily as needed for moderate  pain (pain score 4-6)., Disp: , Rfl:    allopurinol (ZYLOPRIM) 100 MG tablet, Take 1 tablet (100 mg total) by mouth daily., Disp: 30 tablet, Rfl: 0   ALPRAZolam (XANAX) 0.25 MG tablet, Take 0.25 mg by mouth at bedtime., Disp: , Rfl:    amiodarone (PACERONE) 200 MG tablet, Take 1 tablet (200 mg total) by mouth daily., Disp: 90 tablet, Rfl: 1   amLODipine (NORVASC) 10 MG tablet, Take 1 tablet (10 mg total) by mouth daily., Disp: 90 tablet, Rfl: 0   aspirin EC 81 MG tablet, Take 1 tablet (81 mg total) by mouth daily. Swallow whole., Disp: 90 tablet, Rfl: 0   atorvastatin (LIPITOR) 10 MG tablet, Take 1 tablet (10 mg total) by mouth daily., Disp: 90 tablet, Rfl: 0   azithromycin (ZITHROMAX) 500 MG tablet, TAKE 1 TABLET BY MOUTH ONCE DAILY 1  HOUR  BEFORE  DENTAL  PROCEDURES  AND  CLEANINGS, Disp: 12 tablet, Rfl: 1   carvedilol (COREG) 12.5 MG tablet, Take 12.5 mg by mouth 2 (two) times daily., Disp: , Rfl:    cefTRIAXone (ROCEPHIN) IVPB, Inject 2 g into the vein daily. Indication:  osteomyelitis of right foot  First Dose: Yes Last Day of Therapy:  04/13/23 Labs - Once weekly:  CBC/D and BMP, Labs - Once weekly: ESR and  CRP Method of administration: IV Push Method of administration may be changed at the discretion of home infusion pharmacist based upon assessment of the patient and/or caregiver's ability to self-administer the medication ordered., Disp: 18 Units, Rfl: 0   cyanocobalamin 1000 MCG tablet, Take 1 tablet (1,000 mcg total) by mouth daily., Disp: 90 tablet, Rfl: 0   daptomycin (CUBICIN) IVPB, Inject 700 mg into the vein every other day. Indication:  Osteomyelitis of right foot  First Dose: Yes Last Day of Therapy:  04/13/23 Labs - Once weekly:  CBC/D, BMP, and CPK Labs - Once weekly: ESR and CRP Method of administration: IV Push Method of administration may be changed at the discretion of home infusion pharmacist based upon assessment of the patient and/or caregiver's ability to self-administer the medication ordered., Disp: 37 Units, Rfl: 0   ELIQUIS 2.5 MG TABS tablet, Take 1 tablet (2.5 mg total) by mouth 2 (two) times daily., Disp: 60 tablet, Rfl: 5   ferrous sulfate 325 (65 FE) MG EC tablet, Take 1 tablet (325 mg total) by mouth in the morning and at bedtime., Disp: 180 tablet, Rfl: 3   hydrALAZINE (APRESOLINE) 25 MG tablet, Take 1 tablet (25 mg total) by mouth 2 (two) times daily., Disp: 180 tablet, Rfl: 0   isosorbide mononitrate (IMDUR) 30 MG 24 hr tablet, Take 1 tablet (30 mg total) by mouth daily., Disp: 90 tablet, Rfl: 0   levothyroxine (SYNTHROID) 75 MCG tablet, Take 75 mcg by mouth daily before breakfast., Disp: , Rfl:    mirtazapine (REMERON) 15 MG tablet, Take 7.5 mg by mouth at bedtime., Disp: , Rfl:    nitroGLYCERIN (NITROSTAT) 0.4 MG SL tablet, Place 1 tablet (0.4 mg total) under the tongue every 5 (five) minutes as needed for chest pain., Disp: 25 tablet, Rfl: 0   oxyCODONE (OXY IR/ROXICODONE) 5 MG immediate release tablet, Take 1 tablet (5 mg total) by mouth every 6 (six) hours as needed for moderate pain (pain score 4-6)., Disp: 20 tablet, Rfl: 0   sodium bicarbonate 650 MG tablet, Take  1,300 mg by mouth 2 (two) times daily., Disp: , Rfl:   Review of Systems  Constitutional:  Negative for activity change, appetite change, chills, diaphoresis, fatigue, fever and unexpected weight change.  HENT:  Negative for congestion, rhinorrhea, sinus pressure, sneezing, sore throat and trouble swallowing.   Eyes:  Negative for photophobia and visual disturbance.  Respiratory:  Negative for cough, chest tightness, shortness of breath, wheezing and stridor.   Cardiovascular:  Negative for chest pain, palpitations and leg swelling.  Gastrointestinal:  Negative for abdominal distention, abdominal pain, anal bleeding, blood in stool, constipation, diarrhea, nausea and vomiting.  Genitourinary:  Negative for difficulty urinating, dysuria, flank pain and hematuria.  Musculoskeletal:  Positive for joint swelling. Negative for arthralgias, back pain, gait problem and myalgias.  Skin:  Negative for color change, pallor, rash and wound.  Neurological:  Negative for dizziness, tremors, weakness and light-headedness.  Hematological:  Negative for adenopathy. Does not bruise/bleed easily.  Psychiatric/Behavioral:  Negative for agitation, behavioral problems, confusion, decreased concentration, dysphoric mood and sleep disturbance.        Objective:   Physical Exam Constitutional:      Appearance: He is well-developed.  HENT:     Head: Normocephalic and atraumatic.  Eyes:     Conjunctiva/sclera: Conjunctivae normal.  Cardiovascular:     Rate and Rhythm: Normal rate and regular rhythm.  Pulmonary:     Effort: Pulmonary effort is normal. No respiratory distress.     Breath sounds: No wheezing.  Abdominal:     General: There is no distension.     Palpations: Abdomen is soft.  Musculoskeletal:        General: No tenderness. Normal range of motion.     Cervical back: Normal range of motion and neck supple.  Skin:    General: Skin is warm and dry.     Coloration: Skin is not pale.      Findings: No erythema or rash.  Neurological:     General: No focal deficit present.     Mental Status: He is alert and oriented to person, place, and time.  Psychiatric:        Mood and Affect: Mood normal.        Behavior: Behavior normal.        Thought Content: Thought content normal.        Judgment: Judgment normal.     Right toe amputation site with black areas? Scabbing       Left toe    Central line        Assessment & Plan:   Assessment and Plan    Possible infection of left fourth toe Tenderness in left fourth toe, similar to previous infection in right fourth toe. MRI scheduled to assess for infection. Potential need for surgical intervention and IV antibiotics. - Maintain central line until MRI results are available. - Proceed with MRI of the left fourth toe on April 1st. - Schedule follow-up appointment with infectious disease specialist after MRI.  Post-amputation status of right fourth toe Completed IV antibiotics post-amputation. Inflammatory markers decreasing, indicating positive response. Healing well, walking a mile daily. Small scabbing, no active bleeding. - Maintain central line until MRI results are available. - Schedule follow-up appointment with infectious disease specialist after MRI. - Continue daily flushing of the central line with heparin and saline.  Gout No current acute symptoms, relevant for differential diagnosis of toe pai and his inflammatory markers.

## 2023-04-15 ENCOUNTER — Telehealth: Payer: Self-pay

## 2023-04-15 ENCOUNTER — Ambulatory Visit: Admitting: Infectious Disease

## 2023-04-15 ENCOUNTER — Encounter: Payer: Self-pay | Admitting: Infectious Disease

## 2023-04-15 ENCOUNTER — Other Ambulatory Visit: Payer: Self-pay

## 2023-04-15 VITALS — BP 118/66 | HR 73 | Ht 73.0 in | Wt 191.0 lb

## 2023-04-15 DIAGNOSIS — M869 Osteomyelitis, unspecified: Secondary | ICD-10-CM | POA: Diagnosis not present

## 2023-04-15 DIAGNOSIS — Z4781 Encounter for orthopedic aftercare following surgical amputation: Secondary | ICD-10-CM | POA: Diagnosis not present

## 2023-04-15 DIAGNOSIS — N184 Chronic kidney disease, stage 4 (severe): Secondary | ICD-10-CM | POA: Diagnosis not present

## 2023-04-15 DIAGNOSIS — I350 Nonrheumatic aortic (valve) stenosis: Secondary | ICD-10-CM

## 2023-04-15 DIAGNOSIS — Z952 Presence of prosthetic heart valve: Secondary | ICD-10-CM

## 2023-04-15 DIAGNOSIS — I480 Paroxysmal atrial fibrillation: Secondary | ICD-10-CM | POA: Diagnosis not present

## 2023-04-15 DIAGNOSIS — M79675 Pain in left toe(s): Secondary | ICD-10-CM

## 2023-04-15 DIAGNOSIS — I214 Non-ST elevation (NSTEMI) myocardial infarction: Secondary | ICD-10-CM | POA: Diagnosis not present

## 2023-04-15 DIAGNOSIS — C671 Malignant neoplasm of dome of bladder: Secondary | ICD-10-CM

## 2023-04-15 DIAGNOSIS — Z89421 Acquired absence of other right toe(s): Secondary | ICD-10-CM | POA: Diagnosis not present

## 2023-04-15 DIAGNOSIS — C678 Malignant neoplasm of overlapping sites of bladder: Secondary | ICD-10-CM | POA: Diagnosis not present

## 2023-04-15 DIAGNOSIS — C7901 Secondary malignant neoplasm of right kidney and renal pelvis: Secondary | ICD-10-CM | POA: Diagnosis not present

## 2023-04-15 DIAGNOSIS — I130 Hypertensive heart and chronic kidney disease with heart failure and stage 1 through stage 4 chronic kidney disease, or unspecified chronic kidney disease: Secondary | ICD-10-CM | POA: Diagnosis not present

## 2023-04-15 DIAGNOSIS — C689 Malignant neoplasm of urinary organ, unspecified: Secondary | ICD-10-CM

## 2023-04-15 DIAGNOSIS — N179 Acute kidney failure, unspecified: Secondary | ICD-10-CM | POA: Diagnosis not present

## 2023-04-15 DIAGNOSIS — M86171 Other acute osteomyelitis, right ankle and foot: Secondary | ICD-10-CM | POA: Diagnosis not present

## 2023-04-15 DIAGNOSIS — I5042 Chronic combined systolic (congestive) and diastolic (congestive) heart failure: Secondary | ICD-10-CM | POA: Diagnosis not present

## 2023-04-15 HISTORY — DX: Pain in left toe(s): M79.675

## 2023-04-15 NOTE — Telephone Encounter (Signed)
 Per Dr. Daiva Eves central line is to remain in place until patient has MRI of other foot next week and has office f/u after. No extension on Iv abx, patient is to finish abx that he has at home and flush line everyday. Sent a message to [employer] about orders.

## 2023-04-18 DIAGNOSIS — I214 Non-ST elevation (NSTEMI) myocardial infarction: Secondary | ICD-10-CM | POA: Diagnosis not present

## 2023-04-18 DIAGNOSIS — Z89421 Acquired absence of other right toe(s): Secondary | ICD-10-CM | POA: Diagnosis not present

## 2023-04-18 DIAGNOSIS — C678 Malignant neoplasm of overlapping sites of bladder: Secondary | ICD-10-CM | POA: Diagnosis not present

## 2023-04-18 DIAGNOSIS — I5042 Chronic combined systolic (congestive) and diastolic (congestive) heart failure: Secondary | ICD-10-CM | POA: Diagnosis not present

## 2023-04-18 DIAGNOSIS — Z4781 Encounter for orthopedic aftercare following surgical amputation: Secondary | ICD-10-CM | POA: Diagnosis not present

## 2023-04-18 DIAGNOSIS — N179 Acute kidney failure, unspecified: Secondary | ICD-10-CM | POA: Diagnosis not present

## 2023-04-18 DIAGNOSIS — M86171 Other acute osteomyelitis, right ankle and foot: Secondary | ICD-10-CM | POA: Diagnosis not present

## 2023-04-18 DIAGNOSIS — I130 Hypertensive heart and chronic kidney disease with heart failure and stage 1 through stage 4 chronic kidney disease, or unspecified chronic kidney disease: Secondary | ICD-10-CM | POA: Diagnosis not present

## 2023-04-18 DIAGNOSIS — C7901 Secondary malignant neoplasm of right kidney and renal pelvis: Secondary | ICD-10-CM | POA: Diagnosis not present

## 2023-04-19 DIAGNOSIS — I214 Non-ST elevation (NSTEMI) myocardial infarction: Secondary | ICD-10-CM | POA: Diagnosis not present

## 2023-04-19 DIAGNOSIS — M79675 Pain in left toe(s): Secondary | ICD-10-CM | POA: Diagnosis not present

## 2023-04-19 DIAGNOSIS — N179 Acute kidney failure, unspecified: Secondary | ICD-10-CM | POA: Diagnosis not present

## 2023-04-19 DIAGNOSIS — I5042 Chronic combined systolic (congestive) and diastolic (congestive) heart failure: Secondary | ICD-10-CM | POA: Diagnosis not present

## 2023-04-19 DIAGNOSIS — I129 Hypertensive chronic kidney disease with stage 1 through stage 4 chronic kidney disease, or unspecified chronic kidney disease: Secondary | ICD-10-CM | POA: Diagnosis not present

## 2023-04-19 DIAGNOSIS — C678 Malignant neoplasm of overlapping sites of bladder: Secondary | ICD-10-CM | POA: Diagnosis not present

## 2023-04-19 DIAGNOSIS — Z89421 Acquired absence of other right toe(s): Secondary | ICD-10-CM | POA: Diagnosis not present

## 2023-04-19 DIAGNOSIS — Z4781 Encounter for orthopedic aftercare following surgical amputation: Secondary | ICD-10-CM | POA: Diagnosis not present

## 2023-04-19 DIAGNOSIS — M868X7 Other osteomyelitis, ankle and foot: Secondary | ICD-10-CM | POA: Diagnosis not present

## 2023-04-19 DIAGNOSIS — S98139A Complete traumatic amputation of one unspecified lesser toe, initial encounter: Secondary | ICD-10-CM | POA: Diagnosis not present

## 2023-04-19 DIAGNOSIS — I130 Hypertensive heart and chronic kidney disease with heart failure and stage 1 through stage 4 chronic kidney disease, or unspecified chronic kidney disease: Secondary | ICD-10-CM | POA: Diagnosis not present

## 2023-04-19 DIAGNOSIS — M86171 Other acute osteomyelitis, right ankle and foot: Secondary | ICD-10-CM | POA: Diagnosis not present

## 2023-04-19 DIAGNOSIS — N184 Chronic kidney disease, stage 4 (severe): Secondary | ICD-10-CM | POA: Diagnosis not present

## 2023-04-19 DIAGNOSIS — C7901 Secondary malignant neoplasm of right kidney and renal pelvis: Secondary | ICD-10-CM | POA: Diagnosis not present

## 2023-04-19 DIAGNOSIS — E790 Hyperuricemia without signs of inflammatory arthritis and tophaceous disease: Secondary | ICD-10-CM | POA: Diagnosis not present

## 2023-04-22 DIAGNOSIS — L03115 Cellulitis of right lower limb: Secondary | ICD-10-CM | POA: Diagnosis not present

## 2023-04-25 ENCOUNTER — Other Ambulatory Visit (HOSPITAL_COMMUNITY): Payer: Self-pay

## 2023-04-25 ENCOUNTER — Encounter: Payer: Self-pay | Admitting: Infectious Disease

## 2023-04-25 DIAGNOSIS — C678 Malignant neoplasm of overlapping sites of bladder: Secondary | ICD-10-CM | POA: Diagnosis not present

## 2023-04-25 DIAGNOSIS — Z89421 Acquired absence of other right toe(s): Secondary | ICD-10-CM | POA: Diagnosis not present

## 2023-04-25 DIAGNOSIS — I214 Non-ST elevation (NSTEMI) myocardial infarction: Secondary | ICD-10-CM | POA: Diagnosis not present

## 2023-04-25 DIAGNOSIS — C7901 Secondary malignant neoplasm of right kidney and renal pelvis: Secondary | ICD-10-CM | POA: Diagnosis not present

## 2023-04-25 DIAGNOSIS — I130 Hypertensive heart and chronic kidney disease with heart failure and stage 1 through stage 4 chronic kidney disease, or unspecified chronic kidney disease: Secondary | ICD-10-CM | POA: Diagnosis not present

## 2023-04-25 DIAGNOSIS — I5042 Chronic combined systolic (congestive) and diastolic (congestive) heart failure: Secondary | ICD-10-CM | POA: Diagnosis not present

## 2023-04-25 DIAGNOSIS — Z4781 Encounter for orthopedic aftercare following surgical amputation: Secondary | ICD-10-CM | POA: Diagnosis not present

## 2023-04-25 DIAGNOSIS — N179 Acute kidney failure, unspecified: Secondary | ICD-10-CM | POA: Diagnosis not present

## 2023-04-25 DIAGNOSIS — M86171 Other acute osteomyelitis, right ankle and foot: Secondary | ICD-10-CM | POA: Diagnosis not present

## 2023-04-26 ENCOUNTER — Encounter: Payer: Self-pay | Admitting: Infectious Disease

## 2023-04-26 ENCOUNTER — Ambulatory Visit (HOSPITAL_COMMUNITY)
Admission: RE | Admit: 2023-04-26 | Discharge: 2023-04-26 | Disposition: A | Discharge status: Home / Self Care | Payer: Medicare PPO | Source: Ambulatory Visit | Attending: Orthopaedic Surgery | Admitting: Orthopaedic Surgery

## 2023-04-26 DIAGNOSIS — L03116 Cellulitis of left lower limb: Secondary | ICD-10-CM | POA: Insufficient documentation

## 2023-04-26 DIAGNOSIS — R600 Localized edema: Secondary | ICD-10-CM | POA: Diagnosis not present

## 2023-04-26 DIAGNOSIS — M869 Osteomyelitis, unspecified: Secondary | ICD-10-CM | POA: Insufficient documentation

## 2023-04-26 HISTORY — DX: Osteomyelitis, unspecified: M86.9

## 2023-04-26 NOTE — Progress Notes (Signed)
  Device system confirmed to be MRI conditional, with implant date > 6 weeks ago, and no evidence of abandoned or epicardial leads in review of most recent CXR  Cleared by Canary Brim NP   Clearance is good through for 1 year as long as parameters remain stable at time of check. If pt undergoes a cardiac device procedure during that time, they should be re-cleared.   Tachy-therapies to be programmed off if applicable with device back to pre-MRI settings after completion of exam.  [employer] was available remotely to assist in programming recommendations.   Terri Piedra  04/26/2023 10:30 AM

## 2023-04-26 NOTE — Progress Notes (Unsigned)
 Subjective:   No chief complaint on file.  for osteomyelitis of toe   Patient ID: Jordan Oconnell, male    DOB: [birth year], 83 y.o.   MRN: [id number]  HPI   83 y.o. 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   He completed IV antibiotics but had noticed issues with 4th toe  in the opposite foot,  We had him maintain his central line due to concerns we might need IV antibiotics again after potential need for surgery on the opposite foot.  MRI completed yesterday, 04/26/2023 unfortunately shows:  1. Osteomyelitis of the middle and distal phalanges of the fourth toe. 2. Mild marrow edema in the tuft of the distal phalanx of the great toe such that early osteomyelitis in this location is not excluded. 3. Minimal marrow edema in the bifid medial sesamoid, likely inflammatory/sesamoiditis but suspicious of infection involving the sesamoid. 4. Moderate regional muscular atrophy. 5. Mild dorsal subcutaneous edema in the forefoot.        Past Medical History:  Diagnosis Date   Bladder cancer (HCC)    BPH (benign prostatic hyperplasia)    CKD (chronic kidney disease), stage IV (HCC)    Colon polyps    Diverticulosis    GERD (gastroesophageal reflux disease)    Hyperlipidemia    pt denies   Hypertension    PAF (paroxysmal atrial fibrillation) (HCC)    on Flecanide and diltiazem. No OAC given recurrent hematuria   Presence of permanent cardiac pacemaker    Renal cell carcinoma (HCC) 2008   left   S/P TAVR (transcatheter aortic valve replacement) 01/27/2021   s/p TAVR with a 29 mm Edwards  S3UR via the TF approach by Dr. Excell Seltzer and Dr. Laneta Simmers   Severe aortic stenosis 10/05/2020   Toe pain, left 04/15/2023    Past Surgical History:  Procedure Laterality Date   AMPUTATION TOE Right 03/02/2023   Procedure: AMPUTATION TOE, 4th;  Surgeon: Netta Cedars, MD;  Location: [facility];  Service: Orthopedics;  Laterality: Right;   APPENDECTOMY     BLADDER SURGERY  2008   transurethral resection/resection of prostatic urethra   BOWEL RESECTION     CARDIOVERSION N/A 03/03/2022   Procedure: CARDIOVERSION;  Surgeon: Jodelle Red, MD;  Location: [facility];  Service: Cardiovascular;  Laterality: N/A;   CARDIOVERSION N/A 03/12/2022   Procedure: CARDIOVERSION;  Surgeon: Thomasene Ripple, DO;  Location: [facility];  Service: Cardiovascular;  Laterality: N/A;   ESOPHAGOGASTRODUODENOSCOPY N/A 07/24/2013   Procedure: ESOPHAGOGASTRODUODENOSCOPY (EGD);  Surgeon: Hilarie Fredrickson, MD;  Location: [facility];  Service: Endoscopy;  Laterality: N/A;   INTRAOPERATIVE TRANSTHORACIC ECHOCARDIOGRAM N/A 01/27/2021   Procedure: INTRAOPERATIVE TRANSTHORACIC ECHOCARDIOGRAM;  Surgeon: Tonny Bollman, MD;  Location: [facility] INVASIVE CV LAB;  Service: Open Heart Surgery;  Laterality: N/A;   IR FLUORO GUIDE CV LINE RIGHT  03/04/2023   IR US GUIDE VASC ACCESS RIGHT  03/04/2023   laparoscopic surgery  2012   laser   (?)   NEPHRECTOMY  2007   partial, left   NEPHROSTOMY  2011   stent   NM MYOCAR PERF WALL MOTION  09/08/2010   Normal   PACEMAKER IMPLANT N/A 02/12/2021   Procedure:  PACEMAKER IMPLANT;  Surgeon: Lanier Prude, MD;  Location: [facility] INVASIVE CV LAB;  Service: Cardiovascular;  Laterality: N/A;   RIGHT HEART CATH AND CORONARY ANGIOGRAPHY N/A 10/06/2020   Procedure: RIGHT HEART CATH AND CORONARY ANGIOGRAPHY;  Surgeon: Tonny Bollman, MD;  Location: [facility] INVASIVE CV LAB;  Service: Cardiovascular;  Laterality: N/A;   ROBOT ASSISTED LAPAROSCOPIC COMPLETE CYSTECT ILEAL CONDUIT     TRANSCATHETER AORTIC VALVE  REPLACEMENT, TRANSFEMORAL Right 01/27/2021   Procedure: TRANSCATHETER AORTIC VALVE REPLACEMENT, TRANSFEMORAL;  Surgeon: Tonny Bollman, MD;  Location: [facility] INVASIVE CV LAB;  Service: Open Heart Surgery;  Laterality: Right;   US ECHOCARDIOGRAPHY  09/08/2010   mild diastolic dysfunction,mild dilated LA,mild MR,AI,mildly dilated aortic root    Family History  Problem Relation Age of Onset   Ovarian cancer Mother    Colon cancer Mother    Colon cancer Father    Crohn's disease Son    Breast cancer Daughter    Colon cancer Brother       Social History   Socioeconomic History   Marital status: Divorced    Spouse name: Not on file   Number of children: 2   Years of education: Not on file   Highest education level: Not on file  Occupational History   Occupation: Runner, broadcasting/film/video  Tobacco Use   Smoking status: Never   Smokeless tobacco: Never  Vaping Use   Vaping status: Never Used  Substance and Sexual Activity   Alcohol use: Yes    Comment: occasionally   Drug use: No   Sexual activity: Not on file  Other Topics Concern   Not on file  Social History Narrative   Not on file   Social Drivers of Health   Financial Resource Strain: Not on file  Food Insecurity: No Food Insecurity (03/01/2023)   Hunger Vital Sign    Worried About Running Out of Food in the Last Year: Never true    Ran Out of Food in the Last Year: Never true  Transportation Needs: No Transportation Needs (03/01/2023)   PRAPARE - Administrator, Civil Service (Medical): No    Lack of Transportation (Non-Medical): No  Physical Activity: Not on file  Stress: Not on file  Social Connections: Moderately Isolated (03/12/2023)   Social Connection and Isolation Panel [NHANES]    Frequency of Communication with Friends and Family: More than three times a week    Frequency of Social Gatherings with Friends and Family: Once a week    Attends Religious Services: Never    Database administrator or Organizations: No     Attends Engineer, structural: Never    Marital Status: Living with partner    Allergies  Allergen Reactions   Amoxicillin Other (See Comments)    Acute interstitial nephritis  Other Reaction(s): Not available   Tamsulosin     Other Reaction(s): Not available   Doxycycline Hives    Other Reaction(s): Not available   Flomax [Tamsulosin Hcl] Other (See Comments)    Dizzy      Current Outpatient Medications:    acetaminophen (TYLENOL) 500 MG tablet, Take 1,000-1,500 mg by mouth 2 (two) times daily as needed for moderate pain (pain score 4-6)., Disp: , Rfl:    allopurinol (ZYLOPRIM) 100 MG tablet, Take 1 tablet (100 mg total) by mouth daily., Disp: 30 tablet, Rfl: 0   ALPRAZolam (XANAX) 0.25 MG tablet, Take 0.25 mg by mouth at bedtime., Disp: , Rfl:    amiodarone (PACERONE) 200 MG  tablet, Take 1 tablet (200 mg total) by mouth daily., Disp: 90 tablet, Rfl: 1   amLODipine (NORVASC) 10 MG tablet, Take 1 tablet (10 mg total) by mouth daily., Disp: 90 tablet, Rfl: 0   aspirin EC 81 MG tablet, Take 1 tablet (81 mg total) by mouth daily. Swallow whole., Disp: 90 tablet, Rfl: 0   atorvastatin (LIPITOR) 10 MG tablet, Take 1 tablet (10 mg total) by mouth daily., Disp: 90 tablet, Rfl: 0   azithromycin (ZITHROMAX) 500 MG tablet, TAKE 1 TABLET BY MOUTH ONCE DAILY 1  HOUR  BEFORE  DENTAL  PROCEDURES  AND  CLEANINGS, Disp: 12 tablet, Rfl: 1   carvedilol (COREG) 12.5 MG tablet, Take 12.5 mg by mouth 2 (two) times daily., Disp: , Rfl:    cyanocobalamin 1000 MCG tablet, Take 1 tablet (1,000 mcg total) by mouth daily., Disp: 90 tablet, Rfl: 0   ELIQUIS 2.5 MG TABS tablet, Take 1 tablet (2.5 mg total) by mouth 2 (two) times daily., Disp: 60 tablet, Rfl: 5   ferrous sulfate 325 (65 FE) MG EC tablet, Take 1 tablet (325 mg total) by mouth in the morning and at bedtime., Disp: 180 tablet, Rfl: 3   hydrALAZINE (APRESOLINE) 25 MG tablet, Take 1 tablet (25 mg total) by mouth 2 (two) times daily., Disp: 180  tablet, Rfl: 0   isosorbide mononitrate (IMDUR) 30 MG 24 hr tablet, Take 1 tablet (30 mg total) by mouth daily., Disp: 90 tablet, Rfl: 0   levothyroxine (SYNTHROID) 75 MCG tablet, Take 75 mcg by mouth daily before breakfast., Disp: , Rfl:    mirtazapine (REMERON) 15 MG tablet, Take 7.5 mg by mouth at bedtime., Disp: , Rfl:    nitroGLYCERIN (NITROSTAT) 0.4 MG SL tablet, Place 1 tablet (0.4 mg total) under the tongue every 5 (five) minutes as needed for chest pain., Disp: 25 tablet, Rfl: 0   oxyCODONE (OXY IR/ROXICODONE) 5 MG immediate release tablet, Take 1 tablet (5 mg total) by mouth every 6 (six) hours as needed for moderate pain (pain score 4-6)., Disp: 20 tablet, Rfl: 0   sodium bicarbonate 650 MG tablet, Take 1,300 mg by mouth 2 (two) times daily., Disp: , Rfl:   Review of Systems  Constitutional:  Negative for activity change, appetite change, chills, diaphoresis, fatigue, fever and unexpected weight change.  HENT:  Negative for congestion, rhinorrhea, sinus pressure, sneezing, sore throat and trouble swallowing.   Eyes:  Negative for photophobia and visual disturbance.  Respiratory:  Negative for cough, chest tightness, shortness of breath, wheezing and stridor.   Cardiovascular:  Negative for chest pain, palpitations and leg swelling.  Gastrointestinal:  Negative for abdominal distention, abdominal pain, anal bleeding, blood in stool, constipation, diarrhea, nausea and vomiting.  Genitourinary:  Negative for difficulty urinating, dysuria, flank pain and hematuria.  Musculoskeletal:  Positive for joint swelling. Negative for arthralgias, back pain, gait problem and myalgias.  Skin:  Negative for color change, pallor, rash and wound.  Neurological:  Negative for dizziness, tremors, weakness and light-headedness.  Hematological:  Negative for adenopathy. Does not bruise/bleed easily.  Psychiatric/Behavioral:  Negative for agitation, behavioral problems, confusion, decreased concentration,  dysphoric mood and sleep disturbance.        Objective:   Physical Exam Constitutional:      Appearance: He is well-developed.  HENT:     Head: Normocephalic and atraumatic.  Eyes:     Conjunctiva/sclera: Conjunctivae normal.  Cardiovascular:     Rate and Rhythm: Normal rate and regular rhythm.  Pulmonary:     Effort: Pulmonary effort is normal. No respiratory distress.     Breath sounds: No wheezing.  Abdominal:     General: There is no distension.     Palpations: Abdomen is soft.  Musculoskeletal:        General: No tenderness. Normal range of motion.     Cervical back: Normal range of motion and neck supple.  Skin:    General: Skin is warm and dry.     Coloration: Skin is not pale.     Findings: No erythema or rash.  Neurological:     General: No focal deficit present.     Mental Status: He is alert and oriented to person, place, and time.  Psychiatric:        Mood and Affect: Mood normal.        Behavior: Behavior normal.        Thought Content: Thought content normal.        Judgment: Judgment normal.     Right toe amputation site with black areas? Scabbing       Left toe March, 2025:            Assessment & Plan:   Assessment and Plan

## 2023-04-27 ENCOUNTER — Other Ambulatory Visit: Payer: Self-pay | Admitting: Cardiology

## 2023-04-27 ENCOUNTER — Encounter: Payer: Self-pay | Admitting: Infectious Disease

## 2023-04-27 ENCOUNTER — Other Ambulatory Visit: Payer: Self-pay

## 2023-04-27 ENCOUNTER — Ambulatory Visit: Payer: Self-pay | Admitting: Infectious Disease

## 2023-04-27 VITALS — BP 107/67 | HR 81 | Temp 97.5°F | Ht 73.0 in | Wt 190.0 lb

## 2023-04-27 DIAGNOSIS — C689 Malignant neoplasm of urinary organ, unspecified: Secondary | ICD-10-CM | POA: Diagnosis not present

## 2023-04-27 DIAGNOSIS — I480 Paroxysmal atrial fibrillation: Secondary | ICD-10-CM

## 2023-04-27 DIAGNOSIS — M868X7 Other osteomyelitis, ankle and foot: Secondary | ICD-10-CM

## 2023-04-27 DIAGNOSIS — Z950 Presence of cardiac pacemaker: Secondary | ICD-10-CM

## 2023-04-27 DIAGNOSIS — M86271 Subacute osteomyelitis, right ankle and foot: Secondary | ICD-10-CM

## 2023-04-27 DIAGNOSIS — Z952 Presence of prosthetic heart valve: Secondary | ICD-10-CM | POA: Diagnosis not present

## 2023-04-27 DIAGNOSIS — C649 Malignant neoplasm of unspecified kidney, except renal pelvis: Secondary | ICD-10-CM

## 2023-04-28 DIAGNOSIS — C678 Malignant neoplasm of overlapping sites of bladder: Secondary | ICD-10-CM | POA: Diagnosis not present

## 2023-04-28 DIAGNOSIS — N179 Acute kidney failure, unspecified: Secondary | ICD-10-CM | POA: Diagnosis not present

## 2023-04-28 DIAGNOSIS — Z936 Other artificial openings of urinary tract status: Secondary | ICD-10-CM | POA: Diagnosis not present

## 2023-04-28 DIAGNOSIS — Z89421 Acquired absence of other right toe(s): Secondary | ICD-10-CM | POA: Diagnosis not present

## 2023-04-28 DIAGNOSIS — M86171 Other acute osteomyelitis, right ankle and foot: Secondary | ICD-10-CM | POA: Diagnosis not present

## 2023-04-28 DIAGNOSIS — I214 Non-ST elevation (NSTEMI) myocardial infarction: Secondary | ICD-10-CM | POA: Diagnosis not present

## 2023-04-28 DIAGNOSIS — I5042 Chronic combined systolic (congestive) and diastolic (congestive) heart failure: Secondary | ICD-10-CM | POA: Diagnosis not present

## 2023-04-28 DIAGNOSIS — I130 Hypertensive heart and chronic kidney disease with heart failure and stage 1 through stage 4 chronic kidney disease, or unspecified chronic kidney disease: Secondary | ICD-10-CM | POA: Diagnosis not present

## 2023-04-28 DIAGNOSIS — Z4781 Encounter for orthopedic aftercare following surgical amputation: Secondary | ICD-10-CM | POA: Diagnosis not present

## 2023-04-28 DIAGNOSIS — C7901 Secondary malignant neoplasm of right kidney and renal pelvis: Secondary | ICD-10-CM | POA: Diagnosis not present

## 2023-04-29 ENCOUNTER — Encounter (HOSPITAL_COMMUNITY): Payer: Self-pay

## 2023-04-29 DIAGNOSIS — L03116 Cellulitis of left lower limb: Secondary | ICD-10-CM | POA: Diagnosis not present

## 2023-04-29 DIAGNOSIS — L03115 Cellulitis of right lower limb: Secondary | ICD-10-CM | POA: Diagnosis not present

## 2023-05-03 ENCOUNTER — Inpatient Hospital Stay (HOSPITAL_COMMUNITY)
Admission: RE | Admit: 2023-05-03 | Discharge: 2023-05-12 | DRG: 504 | Disposition: A | Discharge status: Skilled Nursing Facility | Source: Ambulatory Visit | Attending: Internal Medicine | Admitting: Internal Medicine

## 2023-05-03 ENCOUNTER — Other Ambulatory Visit: Payer: Self-pay

## 2023-05-03 ENCOUNTER — Encounter (HOSPITAL_COMMUNITY): Payer: Self-pay | Admitting: Orthopaedic Surgery

## 2023-05-03 DIAGNOSIS — I495 Sick sinus syndrome: Secondary | ICD-10-CM | POA: Diagnosis present

## 2023-05-03 DIAGNOSIS — I252 Old myocardial infarction: Secondary | ICD-10-CM

## 2023-05-03 DIAGNOSIS — Z9889 Other specified postprocedural states: Secondary | ICD-10-CM

## 2023-05-03 DIAGNOSIS — K219 Gastro-esophageal reflux disease without esophagitis: Secondary | ICD-10-CM | POA: Diagnosis present

## 2023-05-03 DIAGNOSIS — Z905 Acquired absence of kidney: Secondary | ICD-10-CM

## 2023-05-03 DIAGNOSIS — M6259 Muscle wasting and atrophy, not elsewhere classified, multiple sites: Secondary | ICD-10-CM | POA: Diagnosis present

## 2023-05-03 DIAGNOSIS — Z888 Allergy status to other drugs, medicaments and biological substances status: Secondary | ICD-10-CM

## 2023-05-03 DIAGNOSIS — I10 Essential (primary) hypertension: Secondary | ICD-10-CM | POA: Diagnosis present

## 2023-05-03 DIAGNOSIS — Z881 Allergy status to other antibiotic agents status: Secondary | ICD-10-CM

## 2023-05-03 DIAGNOSIS — I4892 Unspecified atrial flutter: Secondary | ICD-10-CM | POA: Diagnosis present

## 2023-05-03 DIAGNOSIS — E785 Hyperlipidemia, unspecified: Secondary | ICD-10-CM | POA: Diagnosis present

## 2023-05-03 DIAGNOSIS — I77810 Thoracic aortic ectasia: Secondary | ICD-10-CM | POA: Diagnosis present

## 2023-05-03 DIAGNOSIS — C689 Malignant neoplasm of urinary organ, unspecified: Secondary | ICD-10-CM | POA: Diagnosis not present

## 2023-05-03 DIAGNOSIS — I2510 Atherosclerotic heart disease of native coronary artery without angina pectoris: Secondary | ICD-10-CM | POA: Diagnosis present

## 2023-05-03 DIAGNOSIS — Z860100 Personal history of colon polyps, unspecified: Secondary | ICD-10-CM

## 2023-05-03 DIAGNOSIS — N400 Enlarged prostate without lower urinary tract symptoms: Secondary | ICD-10-CM | POA: Diagnosis present

## 2023-05-03 DIAGNOSIS — Z8379 Family history of other diseases of the digestive system: Secondary | ICD-10-CM

## 2023-05-03 DIAGNOSIS — M1990 Unspecified osteoarthritis, unspecified site: Secondary | ICD-10-CM | POA: Diagnosis present

## 2023-05-03 DIAGNOSIS — I5023 Acute on chronic systolic (congestive) heart failure: Secondary | ICD-10-CM | POA: Diagnosis not present

## 2023-05-03 DIAGNOSIS — Z880 Allergy status to penicillin: Secondary | ICD-10-CM

## 2023-05-03 DIAGNOSIS — H9190 Unspecified hearing loss, unspecified ear: Secondary | ICD-10-CM | POA: Diagnosis present

## 2023-05-03 DIAGNOSIS — Z7401 Bed confinement status: Secondary | ICD-10-CM | POA: Diagnosis not present

## 2023-05-03 DIAGNOSIS — Z9079 Acquired absence of other genital organ(s): Secondary | ICD-10-CM

## 2023-05-03 DIAGNOSIS — M869 Osteomyelitis, unspecified: Principal | ICD-10-CM

## 2023-05-03 DIAGNOSIS — I110 Hypertensive heart disease with heart failure: Secondary | ICD-10-CM | POA: Diagnosis not present

## 2023-05-03 DIAGNOSIS — I350 Nonrheumatic aortic (valve) stenosis: Secondary | ICD-10-CM | POA: Diagnosis present

## 2023-05-03 DIAGNOSIS — N179 Acute kidney failure, unspecified: Secondary | ICD-10-CM | POA: Diagnosis not present

## 2023-05-03 DIAGNOSIS — R531 Weakness: Secondary | ICD-10-CM | POA: Diagnosis not present

## 2023-05-03 DIAGNOSIS — M86172 Other acute osteomyelitis, left ankle and foot: Principal | ICD-10-CM | POA: Diagnosis present

## 2023-05-03 DIAGNOSIS — Z89422 Acquired absence of other left toe(s): Secondary | ICD-10-CM | POA: Diagnosis not present

## 2023-05-03 DIAGNOSIS — Z952 Presence of prosthetic heart valve: Secondary | ICD-10-CM

## 2023-05-03 DIAGNOSIS — E039 Hypothyroidism, unspecified: Secondary | ICD-10-CM | POA: Diagnosis present

## 2023-05-03 DIAGNOSIS — Z79890 Hormone replacement therapy (postmenopausal): Secondary | ICD-10-CM

## 2023-05-03 DIAGNOSIS — M86171 Other acute osteomyelitis, right ankle and foot: Secondary | ICD-10-CM | POA: Diagnosis not present

## 2023-05-03 DIAGNOSIS — Z9221 Personal history of antineoplastic chemotherapy: Secondary | ICD-10-CM

## 2023-05-03 DIAGNOSIS — Z7982 Long term (current) use of aspirin: Secondary | ICD-10-CM

## 2023-05-03 DIAGNOSIS — I480 Paroxysmal atrial fibrillation: Secondary | ICD-10-CM | POA: Diagnosis present

## 2023-05-03 DIAGNOSIS — Z89421 Acquired absence of other right toe(s): Secondary | ICD-10-CM | POA: Diagnosis not present

## 2023-05-03 DIAGNOSIS — I214 Non-ST elevation (NSTEMI) myocardial infarction: Secondary | ICD-10-CM | POA: Diagnosis not present

## 2023-05-03 DIAGNOSIS — Z8551 Personal history of malignant neoplasm of bladder: Secondary | ICD-10-CM

## 2023-05-03 DIAGNOSIS — I130 Hypertensive heart and chronic kidney disease with heart failure and stage 1 through stage 4 chronic kidney disease, or unspecified chronic kidney disease: Secondary | ICD-10-CM | POA: Diagnosis present

## 2023-05-03 DIAGNOSIS — L97509 Non-pressure chronic ulcer of other part of unspecified foot with unspecified severity: Secondary | ICD-10-CM | POA: Diagnosis not present

## 2023-05-03 DIAGNOSIS — K5900 Constipation, unspecified: Secondary | ICD-10-CM | POA: Diagnosis present

## 2023-05-03 DIAGNOSIS — Z9049 Acquired absence of other specified parts of digestive tract: Secondary | ICD-10-CM

## 2023-05-03 DIAGNOSIS — E538 Deficiency of other specified B group vitamins: Secondary | ICD-10-CM | POA: Diagnosis present

## 2023-05-03 DIAGNOSIS — N184 Chronic kidney disease, stage 4 (severe): Secondary | ICD-10-CM | POA: Diagnosis not present

## 2023-05-03 DIAGNOSIS — Z950 Presence of cardiac pacemaker: Secondary | ICD-10-CM | POA: Diagnosis present

## 2023-05-03 DIAGNOSIS — Z452 Encounter for adjustment and management of vascular access device: Secondary | ICD-10-CM | POA: Diagnosis not present

## 2023-05-03 DIAGNOSIS — Z803 Family history of malignant neoplasm of breast: Secondary | ICD-10-CM

## 2023-05-03 DIAGNOSIS — L03116 Cellulitis of left lower limb: Secondary | ICD-10-CM | POA: Diagnosis present

## 2023-05-03 DIAGNOSIS — Z960 Presence of urogenital implants: Secondary | ICD-10-CM | POA: Diagnosis present

## 2023-05-03 DIAGNOSIS — M6281 Muscle weakness (generalized): Secondary | ICD-10-CM | POA: Diagnosis not present

## 2023-05-03 DIAGNOSIS — D696 Thrombocytopenia, unspecified: Secondary | ICD-10-CM | POA: Diagnosis present

## 2023-05-03 DIAGNOSIS — I509 Heart failure, unspecified: Secondary | ICD-10-CM | POA: Diagnosis not present

## 2023-05-03 DIAGNOSIS — Z8041 Family history of malignant neoplasm of ovary: Secondary | ICD-10-CM

## 2023-05-03 DIAGNOSIS — Z602 Problems related to living alone: Secondary | ICD-10-CM | POA: Diagnosis present

## 2023-05-03 DIAGNOSIS — Z789 Other specified health status: Secondary | ICD-10-CM

## 2023-05-03 DIAGNOSIS — G4700 Insomnia, unspecified: Secondary | ICD-10-CM | POA: Diagnosis present

## 2023-05-03 DIAGNOSIS — Z923 Personal history of irradiation: Secondary | ICD-10-CM

## 2023-05-03 DIAGNOSIS — Z4781 Encounter for orthopedic aftercare following surgical amputation: Secondary | ICD-10-CM | POA: Diagnosis not present

## 2023-05-03 DIAGNOSIS — I5042 Chronic combined systolic (congestive) and diastolic (congestive) heart failure: Secondary | ICD-10-CM | POA: Diagnosis present

## 2023-05-03 DIAGNOSIS — Z8719 Personal history of other diseases of the digestive system: Secondary | ICD-10-CM

## 2023-05-03 DIAGNOSIS — R262 Difficulty in walking, not elsewhere classified: Secondary | ICD-10-CM | POA: Diagnosis not present

## 2023-05-03 DIAGNOSIS — C7901 Secondary malignant neoplasm of right kidney and renal pelvis: Secondary | ICD-10-CM | POA: Diagnosis not present

## 2023-05-03 DIAGNOSIS — Z800 Family history of malignant neoplasm of digestive organs: Secondary | ICD-10-CM

## 2023-05-03 DIAGNOSIS — C678 Malignant neoplasm of overlapping sites of bladder: Secondary | ICD-10-CM | POA: Diagnosis not present

## 2023-05-03 DIAGNOSIS — Z79899 Other long term (current) drug therapy: Secondary | ICD-10-CM

## 2023-05-03 DIAGNOSIS — Z85528 Personal history of other malignant neoplasm of kidney: Secondary | ICD-10-CM

## 2023-05-03 DIAGNOSIS — Z7901 Long term (current) use of anticoagulants: Secondary | ICD-10-CM

## 2023-05-03 LAB — CBC WITH DIFFERENTIAL/PLATELET
Abs Immature Granulocytes: 0.03 10*3/uL (ref 0.00–0.07)
Basophils Absolute: 0 10*3/uL (ref 0.0–0.1)
Basophils Relative: 1 %
Eosinophils Absolute: 0.1 10*3/uL (ref 0.0–0.5)
Eosinophils Relative: 4 %
HCT: 32.7 % — ABNORMAL LOW (ref 39.0–52.0)
Hemoglobin: 10.2 g/dL — ABNORMAL LOW (ref 13.0–17.0)
Immature Granulocytes: 1 %
Lymphocytes Relative: 35 %
Lymphs Abs: 1.1 10*3/uL (ref 0.7–4.0)
MCH: 31.3 pg (ref 26.0–34.0)
MCHC: 31.2 g/dL (ref 30.0–36.0)
MCV: 100.3 fL — ABNORMAL HIGH (ref 80.0–100.0)
Monocytes Absolute: 0.3 10*3/uL (ref 0.1–1.0)
Monocytes Relative: 9 %
Neutro Abs: 1.7 10*3/uL (ref 1.7–7.7)
Neutrophils Relative %: 50 %
Platelets: 73 10*3/uL — ABNORMAL LOW (ref 150–400)
RBC: 3.26 MIL/uL — ABNORMAL LOW (ref 4.22–5.81)
RDW: 15.5 % (ref 11.5–15.5)
WBC: 3.3 10*3/uL — ABNORMAL LOW (ref 4.0–10.5)
nRBC: 0 % (ref 0.0–0.2)

## 2023-05-03 LAB — BASIC METABOLIC PANEL WITH GFR
Anion gap: 8 (ref 5–15)
BUN: 58 mg/dL — ABNORMAL HIGH (ref 8–23)
CO2: 18 mmol/L — ABNORMAL LOW (ref 22–32)
Calcium: 9 mg/dL (ref 8.9–10.3)
Chloride: 111 mmol/L (ref 98–111)
Creatinine, Ser: 2.6 mg/dL — ABNORMAL HIGH (ref 0.61–1.24)
GFR, Estimated: 24 mL/min — ABNORMAL LOW (ref >60)
Glucose, Bld: 143 mg/dL — ABNORMAL HIGH (ref 70–99)
Potassium: 4 mmol/L (ref 3.5–5.1)
Sodium: 137 mmol/L (ref 135–145)

## 2023-05-03 LAB — PREALBUMIN: Prealbumin: 31 mg/dL (ref 18–38)

## 2023-05-03 LAB — SEDIMENTATION RATE: Sed Rate: 10 mm/h (ref 0–16)

## 2023-05-03 LAB — C-REACTIVE PROTEIN: CRP: 0.5 mg/dL (ref <1.0)

## 2023-05-03 MED ORDER — ONDANSETRON HCL 4 MG/2ML IJ SOLN
4.0000 mg | Freq: Four times a day (QID) | INTRAMUSCULAR | Status: DC | PRN
  Administered 2023-05-04: 4 mg via INTRAVENOUS

## 2023-05-03 MED ORDER — ISOSORBIDE MONONITRATE ER 30 MG PO TB24
30.0000 mg | ORAL_TABLET | Freq: Every day | ORAL | Status: DC
  Administered 2023-05-04 – 2023-05-12 (×9): 30 mg via ORAL
  Filled 2023-05-03 (×9): qty 1

## 2023-05-03 MED ORDER — SODIUM CHLORIDE 0.9% FLUSH: 10.0000 mL | INTRAVENOUS | Status: DC | PRN

## 2023-05-03 MED ORDER — LEVOTHYROXINE SODIUM 75 MCG PO TABS
75.0000 ug | ORAL_TABLET | Freq: Every day | ORAL | Status: DC
  Administered 2023-05-04 – 2023-05-12 (×9): 75 ug via ORAL
  Filled 2023-05-03 (×9): qty 1

## 2023-05-03 MED ORDER — ENOXAPARIN SODIUM 30 MG/0.3ML IJ SOSY
30.0000 mg | PREFILLED_SYRINGE | INTRAMUSCULAR | Status: DC
  Administered 2023-05-04 – 2023-05-05 (×2): 30 mg via SUBCUTANEOUS
  Filled 2023-05-03 (×2): qty 0.3

## 2023-05-03 MED ORDER — ENOXAPARIN SODIUM 40 MG/0.4ML IJ SOSY
40.0000 mg | PREFILLED_SYRINGE | INTRAMUSCULAR | Status: DC
  Administered 2023-05-03: 40 mg via SUBCUTANEOUS
  Filled 2023-05-03: qty 0.4

## 2023-05-03 MED ORDER — ALPRAZOLAM 0.25 MG PO TABS
0.2500 mg | ORAL_TABLET | Freq: Every day | ORAL | Status: DC
Start: 2023-05-03 — End: 2023-05-12
  Administered 2023-05-03 – 2023-05-11 (×9): 0.25 mg via ORAL
  Filled 2023-05-03 (×9): qty 1

## 2023-05-03 MED ORDER — OXYCODONE HCL 5 MG PO TABS
5.0000 mg | ORAL_TABLET | Freq: Four times a day (QID) | ORAL | Status: DC | PRN
  Administered 2023-05-04 – 2023-05-07 (×7): 5 mg via ORAL
  Filled 2023-05-03 (×7): qty 1

## 2023-05-03 MED ORDER — MIRTAZAPINE 15 MG PO TABS
7.5000 mg | ORAL_TABLET | Freq: Every day | ORAL | Status: DC
  Administered 2023-05-03 – 2023-05-11 (×9): 7.5 mg via ORAL
  Filled 2023-05-03 (×9): qty 1

## 2023-05-03 MED ORDER — ACETAMINOPHEN 325 MG PO TABS: 650.0000 mg | ORAL_TABLET | Freq: Four times a day (QID) | ORAL | Status: DC | PRN

## 2023-05-03 MED ORDER — AMIODARONE HCL 200 MG PO TABS
200.0000 mg | ORAL_TABLET | Freq: Every day | ORAL | Status: DC
  Administered 2023-05-04 – 2023-05-12 (×9): 200 mg via ORAL
  Filled 2023-05-03 (×9): qty 1

## 2023-05-03 MED ORDER — ASPIRIN 81 MG PO TBEC
81.0000 mg | DELAYED_RELEASE_TABLET | Freq: Every day | ORAL | Status: DC
  Administered 2023-05-05 – 2023-05-12 (×8): 81 mg via ORAL
  Filled 2023-05-03 (×9): qty 1

## 2023-05-03 MED ORDER — CHLORHEXIDINE GLUCONATE CLOTH 2 % EX PADS
6.0000 | MEDICATED_PAD | Freq: Every day | CUTANEOUS | Status: DC
  Administered 2023-05-03 – 2023-05-10 (×6): 6 via TOPICAL

## 2023-05-03 MED ORDER — DOCUSATE SODIUM 100 MG PO CAPS
100.0000 mg | ORAL_CAPSULE | Freq: Two times a day (BID) | ORAL | Status: DC
  Administered 2023-05-03 – 2023-05-12 (×18): 100 mg via ORAL
  Filled 2023-05-03 (×18): qty 1

## 2023-05-03 MED ORDER — ATORVASTATIN CALCIUM 10 MG PO TABS
10.0000 mg | ORAL_TABLET | Freq: Every day | ORAL | Status: DC
  Administered 2023-05-04 – 2023-05-12 (×9): 10 mg via ORAL
  Filled 2023-05-03 (×9): qty 1

## 2023-05-03 MED ORDER — ACETAMINOPHEN 650 MG RE SUPP: 650.0000 mg | Freq: Four times a day (QID) | RECTAL | Status: DC | PRN

## 2023-05-03 MED ORDER — ALLOPURINOL 100 MG PO TABS
100.0000 mg | ORAL_TABLET | Freq: Every day | ORAL | Status: DC
  Administered 2023-05-04 – 2023-05-12 (×9): 100 mg via ORAL
  Filled 2023-05-03 (×9): qty 1

## 2023-05-03 MED ORDER — HYDRALAZINE HCL 25 MG PO TABS
25.0000 mg | ORAL_TABLET | Freq: Two times a day (BID) | ORAL | Status: DC
  Administered 2023-05-03 – 2023-05-12 (×18): 25 mg via ORAL
  Filled 2023-05-03 (×18): qty 1

## 2023-05-03 MED ORDER — POLYETHYLENE GLYCOL 3350 17 G PO PACK
17.0000 g | PACK | Freq: Every day | ORAL | Status: DC | PRN
  Administered 2023-05-09 – 2023-05-10 (×2): 17 g via ORAL
  Filled 2023-05-03 (×2): qty 1

## 2023-05-03 MED ORDER — AMLODIPINE BESYLATE 10 MG PO TABS
10.0000 mg | ORAL_TABLET | Freq: Every day | ORAL | Status: DC
  Administered 2023-05-04 – 2023-05-12 (×9): 10 mg via ORAL
  Filled 2023-05-03 (×9): qty 1

## 2023-05-03 MED ORDER — HYDRALAZINE HCL 20 MG/ML IJ SOLN: 5.0000 mg | INTRAMUSCULAR | Status: DC | PRN

## 2023-05-03 MED ORDER — CARVEDILOL 12.5 MG PO TABS
12.5000 mg | ORAL_TABLET | Freq: Two times a day (BID) | ORAL | Status: DC
  Administered 2023-05-03 – 2023-05-12 (×18): 12.5 mg via ORAL
  Filled 2023-05-03 (×2): qty 1
  Filled 2023-05-03 (×2): qty 4
  Filled 2023-05-03: qty 1
  Filled 2023-05-03: qty 4
  Filled 2023-05-03 (×6): qty 1
  Filled 2023-05-03 (×2): qty 4
  Filled 2023-05-03: qty 1
  Filled 2023-05-03: qty 4
  Filled 2023-05-03 (×2): qty 1

## 2023-05-03 MED ORDER — ONDANSETRON HCL 4 MG PO TABS: 4.0000 mg | ORAL_TABLET | Freq: Four times a day (QID) | ORAL | Status: DC | PRN

## 2023-05-03 MED ORDER — SODIUM BICARBONATE 650 MG PO TABS
1300.0000 mg | ORAL_TABLET | Freq: Two times a day (BID) | ORAL | Status: DC
  Administered 2023-05-03 – 2023-05-12 (×18): 1300 mg via ORAL
  Filled 2023-05-03 (×18): qty 2

## 2023-05-03 MED ORDER — SODIUM CHLORIDE 0.9% FLUSH
10.0000 mL | Freq: Two times a day (BID) | INTRAVENOUS | Status: DC
  Administered 2023-05-03 – 2023-05-07 (×8): 10 mL
  Administered 2023-05-07: 20 mL
  Administered 2023-05-08 – 2023-05-12 (×8): 10 mL

## 2023-05-03 MED ORDER — BISACODYL 5 MG PO TBEC
5.0000 mg | DELAYED_RELEASE_TABLET | Freq: Every day | ORAL | Status: DC | PRN
  Administered 2023-05-09 – 2023-05-10 (×2): 5 mg via ORAL
  Filled 2023-05-03 (×2): qty 1

## 2023-05-03 NOTE — Discharge Instructions (Signed)
 Netta Cedars, MD [facility]  Please read the following information regarding your care after surgery.  Weight Bearing ? Once your nerve block is completely worn off, OK to HEEL weightbear in postop shoe on the operated leg or foot. This means do NOT touch the front of your surgical leg to the ground!  Cast / Splint / Dressing ? If you have a dressing, do NOT remove this. Keep your splint, cast or dressing clean and dry.  Don't put anything (coat hanger, pencil, etc) down inside of it.  If it gets wet, call the office immediately to schedule an appointment for a cast change.  Swelling IMPORTANT: It is normal for you to have swelling where you had surgery. To reduce swelling and pain, keep at least 3 pillows under your leg so that your toes are above your nose and your heel is above the level of your hip.  It may be necessary to keep your foot or leg elevated for several weeks.  This is critical to helping your incisions heal and your pain to feel better.  Follow Up Call my office at [phone] when you are discharged from the hospital or surgery center to schedule an appointment to be seen 7-10 days after surgery.  Call my office at [phone] if you develop a fever >101.5 F, nausea, vomiting, bleeding from the surgical site or severe pain.

## 2023-05-03 NOTE — Plan of Care (Signed)

## 2023-05-03 NOTE — H&P (Signed)
 History and Physical    Patient: Jordan Oconnell:[id number] DOB: Aug 19, 1940 DOA: 05/03/2023 DOS: the patient was seen and examined on 05/03/2023 PCP: Geoffry Paradise, MD  Patient coming from: Home - lives alone; NOK: Partner, [name], [phone]; Daughter, Tresa Endo, [phone]   Chief Complaint: Toe osteo  HPI: Jordan Oconnell is a 82 y.o. male with medical history significant of bladder cancer, BPH, stage 4 CKD, HTN, HLD, afib, pacemaker, and s/p TAVR who presented for direct admission from orthopedics.  He was previously hospitalized in 02/2023 for R 4th toe amputation.  He appears to have a B 4th toe deformity that makes them prominent.  He had noted some erythema on that L toe during the hospitalization.  He has healed well on the R foot and is back to walking up to a mile a day (not the 3 he was walking prior to surgery yet).  Howver, he developed concern for infection in the L 4th toe and MRI confirmed osteo.  He was scheduled for direct admission for amputation due to concerns about his TAVR and pacemaker.  He has central line access for IV antibiotics post-operatively.  He was recommended to avoid starting antibiotics before surgery to increase the likelihood of obtaining a positive culture.      ER Course:  Direct admission from orthopedics, Dr. Odis Hollingshead.  Scheduled for amputation of 4th L toe tomorrow - developed osteo after amputation for contralateral foot, on abx.  Dr. Daiva Eves consulting, worried about infection of valve and pacer.  Great toe has a contusion but ok.     Review of Systems: As mentioned in the history of present illness. All other systems reviewed and are negative. Past Medical History:  Diagnosis Date   Bladder cancer (HCC)    BPH (benign prostatic hyperplasia)    CKD (chronic kidney disease), stage IV (HCC)    Colon polyps    Diverticulosis    GERD (gastroesophageal reflux disease)    Hyperlipidemia    pt denies   Hypertension     Osteomyelitis of left foot (HCC) 04/26/2023   PAF (paroxysmal atrial fibrillation) (HCC)    on Flecanide and diltiazem. 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 Excell Seltzer and Dr. Laneta Simmers   Severe aortic stenosis 10/05/2020   Toe pain, left 04/15/2023   Past Surgical History:  Procedure Laterality Date   AMPUTATION TOE Right 03/02/2023   Procedure: AMPUTATION TOE, 4th;  Surgeon: Netta Cedars, MD;  Location: [facility] ORS;  Service: Orthopedics;  Laterality: Right;   APPENDECTOMY     BLADDER SURGERY  01/25/2006   transurethral resection/resection of prostatic urethra   BOWEL RESECTION     CARDIOVERSION N/A 03/03/2022   Procedure: CARDIOVERSION;  Surgeon: Jodelle Red, MD;  Location: [facility] ENDOSCOPY;  Service: Cardiovascular;  Laterality: N/A;   CARDIOVERSION N/A 03/12/2022   Procedure: CARDIOVERSION;  Surgeon: Thomasene Ripple, DO;  Location: [facility] ENDOSCOPY;  Service: Cardiovascular;  Laterality: N/A;   ESOPHAGOGASTRODUODENOSCOPY N/A 07/24/2013   Procedure: ESOPHAGOGASTRODUODENOSCOPY (EGD);  Surgeon: Hilarie Fredrickson, MD;  Location: [facility] ENDOSCOPY;  Service: Endoscopy;  Laterality: N/A;   INTRAOPERATIVE TRANSTHORACIC ECHOCARDIOGRAM N/A 01/27/2021   Procedure: INTRAOPERATIVE TRANSTHORACIC ECHOCARDIOGRAM;  Surgeon: Tonny Bollman, MD;  Location: [facility] INVASIVE CV LAB;  Service: Open Heart Surgery;  Laterality:  N/A;   IR FLUORO GUIDE CV LINE RIGHT  03/04/2023   IR US GUIDE VASC ACCESS RIGHT  03/04/2023   laparoscopic surgery  01/25/2010   laser   (?)   NEPHRECTOMY  01/25/2005   partial, left   NEPHROSTOMY  01/25/2009   stent   NM MYOCAR PERF WALL MOTION  09/08/2010   Normal   PACEMAKER IMPLANT N/A 02/12/2021   Procedure: PACEMAKER IMPLANT;  Surgeon: Lanier Prude, MD;  Location: [facility] INVASIVE  CV LAB;  Service: Cardiovascular;  Laterality: N/A;   RIGHT HEART CATH AND CORONARY ANGIOGRAPHY N/A 10/06/2020   Procedure: RIGHT HEART CATH AND CORONARY ANGIOGRAPHY;  Surgeon: Tonny Bollman, MD;  Location: [facility] INVASIVE CV LAB;  Service: Cardiovascular;  Laterality: N/A;   ROBOT ASSISTED LAPAROSCOPIC COMPLETE CYSTECT ILEAL CONDUIT     TRANSCATHETER AORTIC VALVE REPLACEMENT, TRANSFEMORAL Right 01/27/2021   Procedure: TRANSCATHETER AORTIC VALVE REPLACEMENT, TRANSFEMORAL;  Surgeon: Tonny Bollman, MD;  Location: [facility] INVASIVE CV LAB;  Service: Open Heart Surgery;  Laterality: Right;   US ECHOCARDIOGRAPHY  09/08/2010   mild diastolic dysfunction,mild dilated LA,mild MR,AI,mildly dilated aortic root   Social History:  reports that he has never smoked. He has never used smokeless tobacco. He reports that he does not currently use alcohol. He reports that he does not use drugs.  Allergies  Allergen Reactions   Amoxicillin Other (See Comments)    Acute interstitial nephritis   Doxycycline Hives   Flomax [Tamsulosin Hcl] Other (See Comments)    Dizziness    Family History  Problem Relation Age of Onset   Ovarian cancer Mother    Colon cancer Mother    Colon cancer Father    Crohn's disease Son    Breast cancer Daughter    Colon cancer Brother     Prior to Admission medications   Medication Sig Start Date End Date Taking? Authorizing Provider  allopurinol (ZYLOPRIM) 100 MG tablet Take 1 tablet (100 mg total) by mouth daily. 03/12/23  Yes Gherghe, Daylene Katayama, MD  ALPRAZolam Prudy Feeler) 0.25 MG tablet Take 0.25 mg by mouth at bedtime.   Yes [provider]  amiodarone (PACERONE) 200 MG tablet Take 1 tablet by mouth once daily 04/27/23  Yes Lanier Prude, MD  amLODipine (NORVASC) 10 MG tablet Take 1 tablet (10 mg total) by mouth daily. 03/12/23  Yes Leatha Gilding, MD  aspirin EC 81 MG tablet Take 1 tablet (81 mg total) by mouth daily. Swallow whole. 03/12/23  Yes Leatha Gilding, MD   atorvastatin (LIPITOR) 10 MG tablet Take 1 tablet (10 mg total) by mouth daily. 03/12/23  Yes Leatha Gilding, MD  carvedilol (COREG) 12.5 MG tablet Take 12.5 mg by mouth 2 (two) times daily.   Yes [provider]  ELIQUIS 2.5 MG TABS tablet Take 1 tablet (2.5 mg total) by mouth 2 (two) times daily. 03/07/23  Yes Lanier Prude, MD  ferrous sulfate 325 (65 FE) MG EC tablet Take 1 tablet (325 mg total) by mouth in the morning and at bedtime. 02/17/23  Yes Croitoru, Mihai, MD  hydrALAZINE (APRESOLINE) 25 MG tablet Take 1 tablet (25 mg total) by mouth 2 (two) times daily. 03/12/23  Yes Leatha Gilding, MD  isosorbide mononitrate (IMDUR) 30 MG 24 hr tablet Take 1 tablet (30 mg total) by mouth daily. 03/12/23  Yes Leatha Gilding, MD  levothyroxine (SYNTHROID) 75 MCG tablet Take 75 mcg by mouth daily before breakfast.   Yes [provider]  sodium bicarbonate 650 MG tablet Take 1,300 mg by mouth 2 (two) times daily. 09/01/20  Yes [provider]  acetaminophen (TYLENOL) 500 MG tablet Take 1,000 mg by mouth 2 (two) times daily as needed for moderate pain (pain score 4-6).    [provider]  azithromycin (ZITHROMAX) 500 MG tablet TAKE 1 TABLET BY MOUTH ONCE DAILY 1  HOUR  BEFORE  DENTAL  PROCEDURES  AND  CLEANINGS 02/23/23   Croitoru, Mihai, MD  cyanocobalamin 1000 MCG tablet Take 1 tablet (1,000 mcg total) by mouth daily. 03/12/23   Leatha Gilding, MD  mirtazapine (REMERON) 15 MG tablet Take 7.5 mg by mouth at bedtime. 08/09/20   [provider]  mupirocin ointment (BACTROBAN) 2 % Apply 1 Application topically 2 (two) times daily. 04/19/23   [provider]  nitroGLYCERIN (NITROSTAT) 0.4 MG SL tablet Place 1 tablet (0.4 mg total) under the tongue every 5 (five) minutes as needed for chest pain. 03/12/23   Leatha Gilding, MD  oxyCODONE (OXY IR/ROXICODONE) 5 MG immediate release tablet Take 1 tablet (5 mg total) by mouth every 6 (six) hours as needed  for moderate pain (pain score 4-6). 03/12/23   Leatha Gilding, MD    Physical Exam: Vitals:   05/03/23 1200 05/03/23 1238  BP: 124/69 124/69  Pulse: 73 73  Resp: 18 18  Temp:  (!) 97.4 F (36.3 C)  TempSrc:  Oral  SpO2: 100%   Weight:  85.3 kg  Height:  6\' 1"  (1.854 m)   General:  Appears calm and comfortable and is in NAD Eyes:   EOMI, normal lids, iris ENT:  grossly normal hearing, lips & tongue, mmm Neck:  no LAD, masses or thyromegaly Cardiovascular:  RRR. No LE edema.  Respiratory:   CTA bilaterally with no wheezes/rales/rhonchi.  Normal respiratory effort. Abdomen:  soft, NT, ND Skin:  no rash or induration seen on limited exam; scant erythema on distal R great toe, small ecchymosis on L great toenail; R 4th toe amputation healing well Musculoskeletal:  grossly normal tone BUE/BLE, good ROM, no bony abnormality Psychiatric:  grossly normal mood and affect, speech fluent and appropriate, AOx3 Neurologic:  CN 2-12 grossly intact, moves all extremities in coordinated fashion   Radiological Exams on Admission: Independently reviewed - see discussion in A/P where applicable  No results found.  EKG: pending   Labs on Admission: I have personally reviewed the available labs and imaging studies at the time of the admission.  Pertinent labs:    ordered   Assessment and Plan: Principal Problem:   Osteomyelitis of fourth toe of left foot (HCC) Active Problems:   Urothelial cancer (HCC)   PAF (paroxysmal atrial fibrillation) (HCC)   CKD (chronic kidney disease) stage 4, GFR 15-29 ml/min (HCC)   S/P TAVR (transcatheter aortic valve replacement)   Pacemaker   Chronic combined systolic and diastolic CHF (congestive heart failure) (HCC)   Hypothyroidism   Primary hypertension    L 4th toe infection with osteo Patient presenting with MRI findings that are concerning for osteomyelitis Interestingly, he has no current ulceration; there is a chronic hereditary  protrusion of the toe that may increase the risk of friction He has been on antibiotics - he appears to have been on Daptomycin and Ceftriaxone through 3/19 Orthopedics plans on 4th toe amputation 4/8 PT evaluation post-operatively; family is concerned because he lives alone Central line was placed by IR on 2/7 and remains in place in cause IV antibiotics  are needed LE wound order set utilized including labs (CRP, ESR, A1c, prealbumin, HIV, and blood cultures) and consults (peripheral vascular navigator; TOC team; wound care; and nutrition)   CAD  Chest pain during last hospitalization with troponin elevation into 400 range Patient declined to undergo cardiac cath and opted to be managed medically 2D echo with LVEF 45-50%, no obvious wall motion abnormalities, grade 1 diastolic dysfunction. Continue ASA, statin, Imdur   S/p R 4th toe amputation Underwent right fourth toe disarticulation at metatarsophalangeal joint on 03/02/2023   ID consulted, recommend 6 weeks of IV antibiotics with ceftriaxone and Daptomycin (through 3/17) He has done well post-operatively and this appears to be healing well  CKD  Stage IV  Baseline creatinine seems to be around 2.4-2.7 BMP pending today Continue sodium bicarbonate  Urothelial cancer s/p urostomy and RCC s/p left nephrectomy with recurrent disease to solitary right kidney  Has multifocal urothelial disease, follows with [facility] oncology and nephrology On active surveillance after most recent treatment being radiation therapy and 12 months of pembrolizumab Continue with urostomy care Next f/u at [facility] is scheduled for 4/17  Paroxysmal atrial fibrillation/flutter, tachy-brady syndrome  s/p PPM  Continue carvedilol and amiodarone for rate control Hold Eliquis, cover with Lovenox for now  Hypertension  Continue amlodipine, hydralazine Current BP is 124/69  Chronic combined systolic and diastolic CHF  03/06/23 echo with EF 45-50%, grade 1  diastolic dysfunction Appears euvolemic Continue medical management  Hypothyroidism  Continue Synthroid  Severe aortic stenosis s/p TAVR 01/2021  Stable on last echo 03/06/23  Insomnia  Continue mirtazapine and Xanax 0.25 mg nightly  Thrombocytopenia  CBC pending  B12 deficiency  Started intramuscular supplement, transition to oral supplement    Advance Care Planning:   Code Status: Full Code - Code status was discussed with the patient and/or family at the time of admission.  The patient would want to receive full resuscitative measures at this time.   Consults: Orthopedics; ID (telephone only?); peripheral vascular navigator; TOC team; wound care; and nutrition  DVT Prophylaxis: Lovenox  Family Communication: None present; his daughter was on the telephone throughout the encounter  Severity of Illness: The appropriate patient status for this patient is INPATIENT. Inpatient status is judged to be reasonable and necessary in order to provide the required intensity of service to ensure the patient's safety. The patient's presenting symptoms, physical exam findings, and initial radiographic and laboratory data in the context of their chronic comorbidities is felt to place them at high risk for further clinical deterioration. Furthermore, it is not anticipated that the patient will be medically stable for discharge from the hospital within 2 midnights of admission.   * I certify that at the point of admission it is my clinical judgment that the patient will require inpatient hospital care spanning beyond 2 midnights from the point of admission due to high intensity of service, high risk for further deterioration and high frequency of surveillance required.*  Author: Jonah Blue, MD 05/03/2023 4:50 PM  For on call review [email or web address].

## 2023-05-03 NOTE — Progress Notes (Signed)
 Patient arrived to the unit accompanied by daughter. Secure chat with assigned physician, Odis Hollingshead, letting him know patient is here. Reached out to office once more for admitting orders.

## 2023-05-03 NOTE — H&P (Signed)
 ORTHOPAEDIC SURGERY H&P  Subjective:  The patient presents with left 4th toe osteomyelitis.   Past Medical History:  Diagnosis Date   Bladder cancer (HCC)    BPH (benign prostatic hyperplasia)    CKD (chronic kidney disease), stage IV (HCC)    Colon polyps    Diverticulosis    GERD (gastroesophageal reflux disease)    Hyperlipidemia    pt denies   Hypertension    Osteomyelitis of left foot (HCC) 04/26/2023   PAF (paroxysmal atrial fibrillation) (HCC)    on Flecanide and diltiazem. 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 Excell Seltzer and Dr. Laneta Simmers   Severe aortic stenosis 10/05/2020   Toe pain, left 04/15/2023    Past Surgical History:  Procedure Laterality Date   AMPUTATION TOE Right 03/02/2023   Procedure: AMPUTATION TOE, 4th;  Surgeon: Netta Cedars, MD;  Location: [facility] ORS;  Service: Orthopedics;  Laterality: Right;   APPENDECTOMY     BLADDER SURGERY  01/25/2006   transurethral resection/resection of prostatic urethra   BOWEL RESECTION     CARDIOVERSION N/A 03/03/2022   Procedure: CARDIOVERSION;  Surgeon: Jodelle Red, MD;  Location: [facility] ENDOSCOPY;  Service: Cardiovascular;  Laterality: N/A;   CARDIOVERSION N/A 03/12/2022   Procedure: CARDIOVERSION;  Surgeon: Thomasene Ripple, DO;  Location: [facility] ENDOSCOPY;  Service: Cardiovascular;  Laterality: N/A;   ESOPHAGOGASTRODUODENOSCOPY N/A 07/24/2013   Procedure: ESOPHAGOGASTRODUODENOSCOPY (EGD);  Surgeon: Hilarie Fredrickson, MD;  Location: [facility] ENDOSCOPY;  Service: Endoscopy;  Laterality: N/A;   INTRAOPERATIVE TRANSTHORACIC ECHOCARDIOGRAM N/A 01/27/2021   Procedure: INTRAOPERATIVE TRANSTHORACIC ECHOCARDIOGRAM;  Surgeon: Tonny Bollman, MD;  Location: [facility] INVASIVE CV LAB;  Service: Open Heart Surgery;  Laterality: N/A;   IR FLUORO  GUIDE CV LINE RIGHT  03/04/2023   IR US GUIDE VASC ACCESS RIGHT  03/04/2023   laparoscopic surgery  01/25/2010   laser   (?)   NEPHRECTOMY  01/25/2005   partial, left   NEPHROSTOMY  01/25/2009   stent   NM MYOCAR PERF WALL MOTION  09/08/2010   Normal   PACEMAKER IMPLANT N/A 02/12/2021   Procedure: PACEMAKER IMPLANT;  Surgeon: Lanier Prude, MD;  Location: [facility] INVASIVE CV LAB;  Service: Cardiovascular;  Laterality: N/A;   RIGHT HEART CATH AND CORONARY ANGIOGRAPHY N/A 10/06/2020   Procedure: RIGHT HEART CATH AND CORONARY ANGIOGRAPHY;  Surgeon: Tonny Bollman, MD;  Location: [facility] INVASIVE CV LAB;  Service: Cardiovascular;  Laterality: N/A;   ROBOT ASSISTED LAPAROSCOPIC COMPLETE CYSTECT ILEAL CONDUIT     TRANSCATHETER AORTIC VALVE REPLACEMENT, TRANSFEMORAL Right 01/27/2021   Procedure: TRANSCATHETER AORTIC VALVE REPLACEMENT, TRANSFEMORAL;  Surgeon: Tonny Bollman, MD;  Location: [facility] INVASIVE CV LAB;  Service: Open Heart Surgery;  Laterality: Right;   US ECHOCARDIOGRAPHY  09/08/2010   mild diastolic dysfunction,mild dilated LA,mild MR,AI,mildly dilated aortic root     (Not in an outpatient encounter)    Allergies  Allergen Reactions   Amoxicillin Other (See Comments)    Acute interstitial nephritis   Doxycycline Hives   Flomax [Tamsulosin Hcl] Other (See Comments)    Dizziness    Social History   Socioeconomic History   Marital status: Divorced    Spouse name: Not on file   Number of children: 2   Years of education: Not on file   Highest  education level: Not on file  Occupational History   Occupation: teacher  Tobacco Use   Smoking status: Never   Smokeless tobacco: Never  Vaping Use   Vaping status: Never Used  Substance and Sexual Activity   Alcohol use: Not Currently    Comment: occasionally   Drug use: No   Sexual activity: Not on file  Other Topics Concern   Not on file  Social History Narrative   Not on file   Social Drivers of Health   Financial  Resource Strain: Not on file  Food Insecurity: No Food Insecurity (05/03/2023)   Hunger Vital Sign    Worried About Running Out of Food in the Last Year: Never true    Ran Out of Food in the Last Year: Never true  Transportation Needs: No Transportation Needs (05/03/2023)   PRAPARE - Administrator, Civil Service (Medical): No    Lack of Transportation (Non-Medical): No  Physical Activity: Not on file  Stress: Not on file  Social Connections: Unknown (05/03/2023)   Social Connection and Isolation Panel [NHANES]    Frequency of Communication with Friends and Family: Twice a week    Frequency of Social Gatherings with Friends and Family: Twice a week    Attends Religious Services: Patient declined    Database administrator or Organizations: No    Attends Engineer, structural: Never    Marital Status: Patient declined  Recent Concern: Social Connections - Moderately Isolated (03/12/2023)   Social Connection and Isolation Panel [NHANES]    Frequency of Communication with Friends and Family: More than three times a week    Frequency of Social Gatherings with Friends and Family: Once a week    Attends Religious Services: Never    Database administrator or Organizations: No    Attends Banker Meetings: Never    Marital Status: Living with partner  Intimate Partner Violence: Not At Risk (05/03/2023)   Humiliation, Afraid, Rape, and Kick questionnaire    Fear of Current or Ex-Partner: No    Emotionally Abused: No    Physically Abused: No    Sexually Abused: No     History reviewed. No pertinent family history.   Review of Systems Pertinent items are noted in HPI.  Objective: Vital signs in last 24 hours:    05/03/2023    4:36 PM 05/03/2023   12:38 PM 05/03/2023   12:00 PM  Vitals with BMI  Height  6\' 1"    Weight  188 lbs   BMI  24.81   Systolic 137 124 161  Diastolic 68 69 69  Pulse 77 73 73      EXAM: General: Well nourished, well developed.  Awake, alert and oriented to time, place, person. Normal mood and affect. No apparent distress. Breathing room air.  Operative Lower Extremity: Alignment - Neutral Deformity - None Skin intact Tenderness to palpation - left 4th toe 5/5 TA, PT, GS, Per, EHL, FHL Sensation intact to light touch throughout Palpable DP and PT pulses Special testing: None  The contralateral foot/ankle was examined for comparison and noted to be neurovascularly intact with no localized deformity, swelling, or tenderness.  Imaging Review All images taken were independently reviewed by me.  Assessment/Plan: The clinical and radiographic findings were reviewed and discussed at length with the patient.  The patient has  left 4th toe osteomyelitis.  We spoke at length about the natural course of these findings. We discussed nonoperative and operative  treatment options in detail.  The risks and benefits were presented and reviewed. The risks due to suture failure/irritation, new/persistent/recurrent infection, stiffness, nerve/vessel/tendon injury, nonunion/malunion of any fracture, wound healing issues, allograft usage, development of arthritis, failure of this surgery, possibility of external fixation in certain situations, possibility of delayed definitive surgery, need for further surgery, prolonged wound care including further soft tissue coverage procedures, thromboembolic events, anesthesia/medical complications/events perioperatively and beyond, amputation, death among others were discussed. The patient acknowledged the explanation and agreed to proceed with the plan.  Netta Cedars  Orthopaedic Surgery [facility]

## 2023-05-04 ENCOUNTER — Inpatient Hospital Stay (HOSPITAL_COMMUNITY): Payer: Self-pay | Admitting: Anesthesiology

## 2023-05-04 ENCOUNTER — Encounter (HOSPITAL_COMMUNITY)

## 2023-05-04 ENCOUNTER — Inpatient Hospital Stay (HOSPITAL_COMMUNITY): Admission: RE | Admit: 2023-05-04 | Source: Home / Self Care | Admitting: Orthopaedic Surgery

## 2023-05-04 ENCOUNTER — Encounter (HOSPITAL_COMMUNITY)
Admission: RE | Disposition: A | Discharge status: Skilled Nursing Facility | Payer: Self-pay | Source: Ambulatory Visit | Attending: Internal Medicine

## 2023-05-04 ENCOUNTER — Other Ambulatory Visit: Payer: Self-pay

## 2023-05-04 ENCOUNTER — Encounter (HOSPITAL_COMMUNITY): Payer: Self-pay | Admitting: Internal Medicine

## 2023-05-04 DIAGNOSIS — M86172 Other acute osteomyelitis, left ankle and foot: Secondary | ICD-10-CM | POA: Diagnosis not present

## 2023-05-04 DIAGNOSIS — M869 Osteomyelitis, unspecified: Secondary | ICD-10-CM | POA: Diagnosis not present

## 2023-05-04 DIAGNOSIS — I509 Heart failure, unspecified: Secondary | ICD-10-CM

## 2023-05-04 DIAGNOSIS — I110 Hypertensive heart disease with heart failure: Secondary | ICD-10-CM

## 2023-05-04 DIAGNOSIS — L03116 Cellulitis of left lower limb: Secondary | ICD-10-CM

## 2023-05-04 DIAGNOSIS — D696 Thrombocytopenia, unspecified: Secondary | ICD-10-CM

## 2023-05-04 HISTORY — PX: AMPUTATION TOE: SHX6595

## 2023-05-04 LAB — CBC
HCT: 31.3 % — ABNORMAL LOW (ref 39.0–52.0)
Hemoglobin: 9.6 g/dL — ABNORMAL LOW (ref 13.0–17.0)
MCH: 30.8 pg (ref 26.0–34.0)
MCHC: 30.7 g/dL (ref 30.0–36.0)
MCV: 100.3 fL — ABNORMAL HIGH (ref 80.0–100.0)
Platelets: 70 10*3/uL — ABNORMAL LOW (ref 150–400)
RBC: 3.12 MIL/uL — ABNORMAL LOW (ref 4.22–5.81)
RDW: 15.5 % (ref 11.5–15.5)
WBC: 3.7 10*3/uL — ABNORMAL LOW (ref 4.0–10.5)
nRBC: 0 % (ref 0.0–0.2)

## 2023-05-04 LAB — BASIC METABOLIC PANEL WITH GFR
Anion gap: 8 (ref 5–15)
BUN: 55 mg/dL — ABNORMAL HIGH (ref 8–23)
CO2: 18 mmol/L — ABNORMAL LOW (ref 22–32)
Calcium: 8.8 mg/dL — ABNORMAL LOW (ref 8.9–10.3)
Chloride: 113 mmol/L — ABNORMAL HIGH (ref 98–111)
Creatinine, Ser: 2.58 mg/dL — ABNORMAL HIGH (ref 0.61–1.24)
GFR, Estimated: 24 mL/min — ABNORMAL LOW (ref >60)
Glucose, Bld: 89 mg/dL (ref 70–99)
Potassium: 4.5 mmol/L (ref 3.5–5.1)
Sodium: 139 mmol/L (ref 135–145)

## 2023-05-04 LAB — HEMOGLOBIN A1C
Hgb A1c MFr Bld: 5 % (ref 4.8–5.6)
Mean Plasma Glucose: 97 mg/dL

## 2023-05-04 SURGERY — AMPUTATION, TOE
Anesthesia: Monitor Anesthesia Care | Site: Toe | Laterality: Left

## 2023-05-04 MED ORDER — VANCOMYCIN HCL 1000 MG IV SOLR
INTRAVENOUS | Status: AC
  Filled 2023-05-04: qty 20

## 2023-05-04 MED ORDER — PROPOFOL 500 MG/50ML IV EMUL
INTRAVENOUS | Status: DC | PRN
  Administered 2023-05-04: 130 ug/kg/min via INTRAVENOUS
  Administered 2023-05-04: 30 mg via INTRAVENOUS

## 2023-05-04 MED ORDER — ENSURE ENLIVE PO LIQD
237.0000 mL | Freq: Two times a day (BID) | ORAL | Status: DC
  Administered 2023-05-05 – 2023-05-11 (×12): 237 mL via ORAL

## 2023-05-04 MED ORDER — VANCOMYCIN HCL 1000 MG IV SOLR
INTRAVENOUS | Status: DC | PRN
  Administered 2023-05-04: 1000 mg via TOPICAL

## 2023-05-04 MED ORDER — OXYCODONE HCL 5 MG PO TABS: 5.0000 mg | ORAL_TABLET | Freq: Once | ORAL | Status: DC | PRN

## 2023-05-04 MED ORDER — JUVEN PO PACK
1.0000 | PACK | Freq: Two times a day (BID) | ORAL | Status: DC
  Administered 2023-05-05 – 2023-05-12 (×15): 1 via ORAL
  Filled 2023-05-04 (×15): qty 1

## 2023-05-04 MED ORDER — ACETAMINOPHEN 500 MG PO TABS
1000.0000 mg | ORAL_TABLET | Freq: Once | ORAL | Status: AC
  Administered 2023-05-04: 1000 mg via ORAL
  Filled 2023-05-04: qty 2

## 2023-05-04 MED ORDER — SODIUM CHLORIDE 0.9 % IR SOLN
Status: DC | PRN
  Administered 2023-05-04: 1000 mL

## 2023-05-04 MED ORDER — EPHEDRINE SULFATE-NACL 50-0.9 MG/10ML-% IV SOSY
PREFILLED_SYRINGE | INTRAVENOUS | Status: DC | PRN
  Administered 2023-05-04: 7.5 mg via INTRAVENOUS

## 2023-05-04 MED ORDER — PHENYLEPHRINE 80 MCG/ML (10ML) SYRINGE FOR IV PUSH (FOR BLOOD PRESSURE SUPPORT)
PREFILLED_SYRINGE | INTRAVENOUS | Status: DC | PRN
  Administered 2023-05-04 (×2): 160 ug via INTRAVENOUS
  Administered 2023-05-04: 120 ug via INTRAVENOUS

## 2023-05-04 MED ORDER — OXYCODONE HCL 5 MG/5ML PO SOLN: 5.0000 mg | Freq: Once | ORAL | Status: DC | PRN

## 2023-05-04 MED ORDER — DAPTOMYCIN-SODIUM CHLORIDE 700-0.9 MG/100ML-% IV SOLN
8.0000 mg/kg | INTRAVENOUS | Status: DC
  Administered 2023-05-04 – 2023-05-08 (×3): 700 mg via INTRAVENOUS
  Filled 2023-05-04 (×4): qty 100

## 2023-05-04 MED ORDER — CHLORHEXIDINE GLUCONATE CLOTH 2 % EX PADS: MEDICATED_PAD | Freq: Once | CUTANEOUS | Status: AC

## 2023-05-04 MED ORDER — BUPIVACAINE HCL (PF) 0.5 % IJ SOLN
INTRAMUSCULAR | Status: DC | PRN
  Administered 2023-05-04: 25 mL via PERINEURAL

## 2023-05-04 MED ORDER — LACTATED RINGERS IV SOLN: INTRAVENOUS | Status: DC | PRN

## 2023-05-04 MED ORDER — MIDAZOLAM HCL 2 MG/2ML IJ SOLN: 2.0000 mg | INTRAMUSCULAR | Status: DC

## 2023-05-04 MED ORDER — CEFAZOLIN SODIUM-DEXTROSE 2-4 GM/100ML-% IV SOLN
2.0000 g | INTRAVENOUS | Status: AC
  Administered 2023-05-04: 2 g via INTRAVENOUS
  Filled 2023-05-04: qty 100

## 2023-05-04 MED ORDER — ZINC SULFATE 220 (50 ZN) MG PO CAPS
220.0000 mg | ORAL_CAPSULE | Freq: Every day | ORAL | Status: DC
  Administered 2023-05-05 – 2023-05-12 (×8): 220 mg via ORAL
  Filled 2023-05-04 (×8): qty 1

## 2023-05-04 MED ORDER — SODIUM CHLORIDE 0.9 % IV SOLN
2.0000 g | INTRAVENOUS | Status: DC
  Administered 2023-05-04 – 2023-05-08 (×5): 2 g via INTRAVENOUS
  Filled 2023-05-04 (×6): qty 20

## 2023-05-04 MED ORDER — FENTANYL CITRATE PF 50 MCG/ML IJ SOSY
100.0000 ug | PREFILLED_SYRINGE | INTRAMUSCULAR | Status: DC
  Administered 2023-05-04: 50 ug via INTRAVENOUS
  Filled 2023-05-04: qty 2

## 2023-05-04 MED ORDER — BACITRACIN ZINC 500 UNIT/GM EX OINT
TOPICAL_OINTMENT | CUTANEOUS | Status: AC
Start: 2023-05-04 — End: ?
  Filled 2023-05-04: qty 28.35

## 2023-05-04 MED ORDER — FENTANYL CITRATE PF 50 MCG/ML IJ SOSY: 25.0000 ug | PREFILLED_SYRINGE | INTRAMUSCULAR | Status: DC | PRN

## 2023-05-04 SURGICAL SUPPLY — 46 items
BAG COUNTER SPONGE SURGICOUNT (BAG) IMPLANT
BAG ZIPLOCK 12X15 (MISCELLANEOUS) ×1 IMPLANT
BLADE SURG 15 STRL LF DISP TIS (BLADE) ×3 IMPLANT
BLADE SW THK.38XMED LNG THN (BLADE) IMPLANT
BNDG COHESIVE 4X5 TAN STRL LF (GAUZE/BANDAGES/DRESSINGS) ×1 IMPLANT
BNDG ELASTIC 4INX 5YD STR LF (GAUZE/BANDAGES/DRESSINGS) IMPLANT
BNDG ESMARK 4X9 LF (GAUZE/BANDAGES/DRESSINGS) ×1 IMPLANT
BNDG GAUZE DERMACEA FLUFF 4 (GAUZE/BANDAGES/DRESSINGS) ×1 IMPLANT
BNDG STRETCH GAUZE 3IN X12FT (GAUZE/BANDAGES/DRESSINGS) ×1 IMPLANT
CHLORAPREP W/TINT 26 (MISCELLANEOUS) ×2 IMPLANT
CNTNR URN SCR LID CUP LEK RST (MISCELLANEOUS) IMPLANT
COVER SURGICAL LIGHT HANDLE (MISCELLANEOUS) ×1 IMPLANT
CUFF TRNQT CYL 34X4.125X (TOURNIQUET CUFF) IMPLANT
DRAPE C-ARM 42X120 X-RAY (DRAPES) IMPLANT
DRAPE EXTREMITY T 121X128X90 (DISPOSABLE) ×1 IMPLANT
DRAPE OEC MINIVIEW 54X84 (DRAPES) ×1 IMPLANT
DRAPE SHEET LG 3/4 BI-LAMINATE (DRAPES) ×1 IMPLANT
DRAPE SURG 17X11 SM STRL (DRAPES) ×2 IMPLANT
DRAPE U-SHAPE 47X51 STRL (DRAPES) ×2 IMPLANT
DRESSING MEPILEX FLEX 4X4 (GAUZE/BANDAGES/DRESSINGS) IMPLANT
DRSG ADAPTIC 3X8 NADH LF (GAUZE/BANDAGES/DRESSINGS) ×1 IMPLANT
DRSG EMULSION OIL 3X3 NADH (GAUZE/BANDAGES/DRESSINGS) ×1 IMPLANT
DRSG MEPILEX FLEX 4X4 (GAUZE/BANDAGES/DRESSINGS) ×1 IMPLANT
ELECT PENCIL ROCKER SW 15FT (MISCELLANEOUS) ×1 IMPLANT
ELECT REM PT RETURN 15FT ADLT (MISCELLANEOUS) ×1 IMPLANT
GAUZE PAD ABD 8X10 STRL (GAUZE/BANDAGES/DRESSINGS) ×1 IMPLANT
GAUZE SPONGE 4X4 12PLY STRL (GAUZE/BANDAGES/DRESSINGS) ×1 IMPLANT
GLOVE BIO SURGEON STRL SZ8 (GLOVE) ×1 IMPLANT
GLOVE BIOGEL PI IND STRL 7.5 (GLOVE) ×2 IMPLANT
GLOVE BIOGEL PI IND STRL 8 (GLOVE) ×1 IMPLANT
GLOVE SS BIOGEL STRL SZ 7.5 (GLOVE) ×1 IMPLANT
GOWN STRL REUS W/ TWL LRG LVL3 (GOWN DISPOSABLE) ×1 IMPLANT
KIT BASIN OR (CUSTOM PROCEDURE TRAY) ×1 IMPLANT
KIT TURNOVER KIT A (KITS) IMPLANT
MARKER SKIN DUAL TIP RULER LAB (MISCELLANEOUS) ×1 IMPLANT
NS IRRIG 1000ML POUR BTL (IV SOLUTION) ×1 IMPLANT
PACK GENERAL/GYN (CUSTOM PROCEDURE TRAY) ×1 IMPLANT
PAD CAST 4YDX4 CTTN HI CHSV (CAST SUPPLIES) ×1 IMPLANT
PROTECTOR NERVE ULNAR (MISCELLANEOUS) ×2 IMPLANT
SET IRRIG Y TYPE TUR BLADDER L (SET/KITS/TRAYS/PACK) ×1 IMPLANT
STOCKINETTE 8 INCH (MISCELLANEOUS) ×1 IMPLANT
SUT ETHILON 3 0 PS 1 (SUTURE) ×1 IMPLANT
SUT PDS AB 2-0 CT2 27 (SUTURE) IMPLANT
SUT PDS AB 3-0 PS2 18 (SUTURE) IMPLANT
SUT PDS AB 3-0 SH 27 (SUTURE) IMPLANT
WATER STERILE IRR 1000ML POUR (IV SOLUTION) ×1 IMPLANT

## 2023-05-04 NOTE — Anesthesia Postprocedure Evaluation (Signed)
 Anesthesia Post Note  Patient: Jordan Oconnell  Procedure(s) Performed: AMPUTATION, TOE (Left: Toe)     Patient location during evaluation: PACU Anesthesia Type: Regional and MAC Level of consciousness: awake and alert Pain management: pain level controlled Vital Signs Assessment: post-procedure vital signs reviewed and stable Respiratory status: spontaneous breathing, nonlabored ventilation and respiratory function stable Cardiovascular status: blood pressure returned to baseline Postop Assessment: no apparent nausea or vomiting Anesthetic complications: no   No notable events documented.  Last Vitals:  Vitals:   05/04/23 1513 05/04/23 1515  BP: 134/73 134/73  Pulse: 67 67  Resp: 14 14  Temp: 36.5 C   SpO2: 94% 96%    Last Pain:  Vitals:   05/04/23 1515  TempSrc:   PainSc: 0-No pain    LLE Motor Response: Purposeful movement (05/04/23 1515) LLE Sensation: Numbness (05/04/23 1515)          Shanda Howells

## 2023-05-04 NOTE — Progress Notes (Signed)
 PROGRESS NOTE  Jordan Oconnell  ZOX:[id number] DOB: [birth year] DOA: 05/03/2023 PCP: Geoffry Paradise, MD   Brief Narrative: Patient is a 83 year old male with history of bladder cancer, stage IV CKD, BPH, hypertension, hyperlipidemia, A-fib, status post pacemaker, status post TAVR who presented for direct admission from orthopedics for evaluation of left foot osteomyelitis.  MRI confirmed osteomyelitis of 4th left which with done as outpatient.  He was scheduled for direct admission for amputation due to concerns of TAVR, pacemaker.  Orthopedics following.  ID also consulted.  Assessment & Plan:  Principal Problem:   Osteomyelitis of fourth toe of left foot (HCC) Active Problems:   Urothelial cancer (HCC)   PAF (paroxysmal atrial fibrillation) (HCC)   CKD (chronic kidney disease) stage 4, GFR 15-29 ml/min (HCC)   S/P TAVR (transcatheter aortic valve replacement)   Pacemaker   Chronic combined systolic and diastolic CHF (congestive heart failure) (HCC)   Hypothyroidism   Primary hypertension   Left fourth toe osteomyelitis: MRI findings were concerning for osteomyelitis.  Direct admission as per orthopedics.   ID also consulted and following.  Also has history of right fourth toe amputation on 03/02/2023, finished antibiotics course with ceftriaxone, daptomycin.  Postoperatively he has been doing well and wound is healing well  History of coronary artery disease: Currently denies any anginal symptoms.  Last hospitalization, troponin was elevated to 400s.  He declined cardiac cath and opted to manage medically.  Last 2D echo showed EF of 45 to 50%, no wall motion abnormality, grade 1 diastolic dysfunction.  Continue aspirin, statin, Imdur  History of urothelial cancer: Status post urostomy.  Status post left nephrectomy with recurrent disease in the solitary right kidney.  Follows with [facility], oncology and nephrology.  Continue urostomy care.  Next follow-up at [facility] EKG scheduled for  4/17.  Was taking pembrolizumab.  Paroxysmal A-fib/A-flutter/tachybradycardia syndrome: Status post pacemaker.  Continue carvedilol, amiodarone for rate control.  On Eliquis at home which is currently on hold for surgical intervention  Hypertension: On amlodipine, hydralazine.  Currently blood pressure stable  Chronic combined systolic/diastolic WJX:BJYN 2D echo showed EF of 45 to 50%, no wall motion abnormality, grade 1 diastolic dysfunction.  Currently appears euvolemic.  Continue current medications  Hypothyroidism: Continue Synthyroid  Severe aortic stenosis: Status post TAVR on 1/23  Vitamin B-12 deficiency: Takes supplementation at home        DVT prophylaxis:enoxaparin (LOVENOX) injection 30 mg Start: 05/04/23 1800     Code Status: Full Code  Family Communication:   Patient status:Inpatient  Patient is from :Home  Anticipated discharge WG:NFAO  Estimated DC date:2-3 days   Consultants: Orthopedics,ID  Procedures:Plan for toe amputation  Antimicrobials:  Anti-infectives (From admission, onward)    Start     Dose/Rate Route Frequency Ordered Stop   05/04/23 0745  ceFAZolin (ANCEF) IVPB 2g/100 mL premix        2 g 200 mL/hr over 30 Minutes Intravenous On call to O.R. 05/04/23 1308 05/05/23 0559       Subjective:  Patient seen and examined at bedside today.  Hemodynamically stable.  Comfortable.  Lying on bed.  Waiting for surgical intervention today.  No new complaints  Objective: Vitals:   05/03/23 1636 05/03/23 2124 05/04/23 0403 05/04/23 0758  BP: 137/68 118/66 139/77 (!) 158/77  Pulse: 77 70 66 72  Resp: 16 18 18 16   Temp: 97.7 F (36.5 C) 97.8 F (36.6 C) (!) 97.4 F (36.3 C) 97.9 F (36.6 C)  TempSrc: Oral Oral  Oral  SpO2: 99% 98% 98% 98%  Weight:      Height:        Intake/Output Summary (Last 24 hours) at 05/04/2023 6045 Last data filed at 05/04/2023 0406 Gross per 24 hour  Intake 120 ml  Output 1600 ml  Net -1480 ml   Filed  Weights   05/03/23 1238  Weight: 85.3 kg    Examination:  General exam: Overall comfortable, not in distress, pleasant elderly male HEENT: PERRL Respiratory system:  no wheezes or crackles  Cardiovascular system: S1 & S2 heard, RRR.  Gastrointestinal system: Abdomen is nondistended, soft and nontender. Central nervous system: Alert and oriented Extremities: No edema, no clubbing ,no cyanosis, no ulcers seen on the toe Skin: No rashes, no ulcers,no icterus     Data Reviewed: I have personally reviewed following labs and imaging studies  CBC: Recent Labs  Lab 05/03/23 1749 05/04/23 0359  WBC 3.3* 3.7*  NEUTROABS 1.7  --   HGB 10.2* 9.6*  HCT 32.7* 31.3*  MCV 100.3* 100.3*  PLT 73* 70*   Basic Metabolic Panel: Recent Labs  Lab 05/03/23 1749 05/04/23 0359  NA 137 139  K 4.0 4.5  CL 111 113*  CO2 18* 18*  GLUCOSE 143* 89  BUN 58* 55*  CREATININE 2.60* 2.58*  CALCIUM 9.0 8.8*     No results found for this or any previous visit (from the past 240 hours).   Radiology Studies: No results found.  Scheduled Meds:  allopurinol  100 mg Oral Daily   ALPRAZolam  0.25 mg Oral QHS   amiodarone  200 mg Oral Daily   amLODipine  10 mg Oral Daily   aspirin EC  81 mg Oral Daily   atorvastatin  10 mg Oral Daily   carvedilol  12.5 mg Oral BID   Chlorhexidine Gluconate Cloth  6 each Topical Daily   Chlorhexidine Gluconate Cloth   Topical Once   docusate sodium  100 mg Oral BID   enoxaparin (LOVENOX) injection  30 mg Subcutaneous Q24H   hydrALAZINE  25 mg Oral BID   isosorbide mononitrate  30 mg Oral Daily   levothyroxine  75 mcg Oral QAC breakfast   mirtazapine  7.5 mg Oral QHS   sodium bicarbonate  1,300 mg Oral BID   sodium chloride flush  10-40 mL Intracatheter Q12H   Continuous Infusions:   ceFAZolin (ANCEF) IV       LOS: 1 day   Burnadette Pop, MD [facility] P4/09/2023, 8:22 AM

## 2023-05-04 NOTE — Op Note (Signed)
 03/02/2023  5:36 PM   PATIENT: Jordan Oconnell  83 y.o. male  MRN: [id number]   PRE-OPERATIVE DIAGNOSIS:   Left fourth toe extensive osteomyelitis involving proximal and middle phalanges with forefoot cellulitis   POST-OPERATIVE DIAGNOSIS:   Same   PROCEDURE: Left 4th toe disarticulation at metatarsophalangeal joint   SURGEON:  Netta Cedars, MD   ASSISTANT: None   ANESTHESIA: General, regional   EBL: 2 cc   TOURNIQUET:  None used   COMPLICATIONS: None apparent   DISPOSITION: Extubated, awake and stable to recovery.   INDICATION FOR PROCEDURE: The patient presented with above diagnosis.  We discussed the diagnosis, alternative treatment options, risks and benefits of the above surgical intervention, as well as alternative non-operative treatments. All questions/concerns were addressed and the patient/family demonstrated appropriate understanding of the diagnosis, the procedure, the postoperative course, and overall prognosis. The patient wished to proceed with surgical intervention and signed an informed surgical consent as such, in each others presence prior to surgery.   PROCEDURE IN DETAIL: After preoperative consent was obtained and the correct operative site was identified, the patient was brought to the operating room supine on stretcher and transferred onto operating table. General anesthesia was induced. Preoperative antibiotics were administered. Surgical timeout was taken. The patient was then positioned supine with an ipsilateral hip bump. The operative lower extremity was prepped and draped in standard sterile fashion.  Level of amputation was determined by soft tissue status to be the fourth MTP joint as confirmed by intraoperative examination. A standard racket incision was made in healthy skin area at the level of the metatarsophalangeal joint. Dissection was carried down sharply to the proximal phalanx. The surrounding tendons and other soft  tissues were carefully transected and the collateral ligaments of the MTP joint released. The infected toe was thus excised and sent as specimen for bone biopsy/intraoperative cultures.  We then irrigated the surgical site thoroughly with 3 L of normal saline. Healthy bleeding noted throughout the amputation stump site. There were no signs of infection remaining in the surgical site following amputation. Hemostasis was carefully obtained. Mild steady bleeding observed and noted to be appropriate for surgical site. Betadine and vancomycin powder were placed in the amputation site.    The skin was closed with zero tension using 2-0 prolene suture. The incision was noted to be dry upon closure. Overall, the operative foot was noted to be significantly less erythematous with decreased swelling.   The leg was cleaned with saline and sterile adaptic dressings were applied. A well padded sterile dressing was applied. The patient was awakened from anesthesia and transported to the recovery room in stable condition.     FOLLOW UP PLAN: -transfer to PACU, then return to [facility] under hospitalist team -strict heel WB operative extremity, maximum elevation -maintain dressings until 24 hr, then bedside nursing to do daily changes. Keep incision dry -DVT ppx: resume Eliquis POD1 or per hospitalist team discretion -follow up as outpatient within 7-10 days for wound check -trend intraoperative cultures -IV abx per hospitalist/ID teams, due to extensive bone infection advise min 6 wk course. Per patient, plan for Echo as discussed with PCP/Cardiology to rule out valvular vegetation - he would like [place] to consider doing while inpatient -sutures out in 3 weeks in outpatient office   RADIOGRAPHS: None   Netta Cedars [facility]

## 2023-05-04 NOTE — Consult Note (Signed)
 [facility]                                                                                        Patient Identification: Patient Name: Jordan Oconnell MRN: [id number] Admit Date: 05/03/2023 11:25 AM Today's Date: 05/04/2023 Reason for consult: Left foot osteomyelitis Requesting provider: Dr. Odis Hollingshead  Principal Problem:   Osteomyelitis of fourth toe of left foot (HCC) Active Problems:   Urothelial cancer (HCC)   PAF (paroxysmal atrial fibrillation) (HCC)   CKD (chronic kidney disease) stage 4, GFR 15-29 ml/min (HCC)   S/P TAVR (transcatheter aortic valve replacement)   Pacemaker   Chronic combined systolic and diastolic CHF (congestive heart failure) (HCC)   Hypothyroidism   Primary hypertension   Antibiotics:  Off antibiotics  Lines/Hardware: Tunneled CVC in right internal jugular, right urostomy, PPM and TAVR  Assessment # Left 4th toe osteomyelitis, possible early osteomyelitis of  left great toe as well as sesamoid - antibiotics on hold for planned OR later today  # Chronic Thrombocytopenia - monitor   # CVC - will decide need for need of access post OR  Recommendations  - Daptomycin and ceftriaxone to be started post operatively  - Fu OR findings from today as well as OR cultures  - Monitor CBC, CMP and CPK once abtx started - Universal/standard isolation precautions  - Final recs pending OR and cultures   Rest of the management as per the primary team. Please call with questions or concerns.  Thank you for the consult  __________________________________________________________________________________________________________ HPI and Hospital Course: 83 year old male with prior history of Urothelial cancer s/p TURP, cystectomy with ileal conduit, CKD in the context of RCC s/p partial left nephrectomy,  BPH, GERD, HLD, HTN, PAF, TachyBrachy Syndrome s/p PPM, aortic  stenosis s/p TAVR, s/p right fourth toe disarticulation at [facility] for osteomyelitis of right fourth toe status 03/02/23  (Cx NG) post treatment with IV ceftriaxone and daptomycin, EOT 04/13/23 who was directly admitted from orthopedics for concern for infection of left fourth toe after MRI confirmed osteomyelitis. Denies prior fevers, chills, night sweats. Denies nausea, vomiting, abdominal pain and diarrhea. Denies GU symptoms. Denies taking abtx PTA.  On presentation, afebrile Labs remarkable for creatinine around 2.6, WBC 3.3, hemoglobin 10.2, platelets 73  04/26/23 MRI left foot 1. Osteomyelitis of the middle and distal phalanges of the fourth toe. 2. Mild marrow edema in the tuft of the distal phalanx of the great toe such that early osteomyelitis in this location is not excluded. 3. Minimal marrow edema in the bifid medial sesamoid, likely inflammatory/sesamoiditis but suspicious of infection involving the sesamoid. 4. Moderate regional muscular atrophy. 5. Mild dorsal subcutaneous edema in the forefoot.    ROS: General- Denies fever, chills, loss of appetite and loss of weight HEENT - Denies headache, blurry vision, neck pain, sinus pain Chest - Denies any chest pain, SOB or cough CVS- Denies any dizziness/lightheadedness, syncopal attacks, palpitations Abdomen- Denies any nausea, vomiting, abdominal pain, hematochezia and diarrhea Neuro - Denies any weakness, numbness, tingling sensation Psych - Denies any changes in mood irritability or depressive symptoms GU- Denies any burning, dysuria,  hematuria or increased frequency of urination Skin - denies any rashes/lesions MSK - denies any joint pain/swelling or restricted ROM   Past Medical History:  Diagnosis Date   Bladder cancer (HCC)    BPH (benign prostatic hyperplasia)    CKD (chronic kidney disease), stage IV (HCC)    Colon polyps    Diverticulosis    GERD (gastroesophageal reflux disease)    Hyperlipidemia    pt denies    Hypertension    Osteomyelitis of left foot (HCC) 04/26/2023   PAF (paroxysmal atrial fibrillation) (HCC)    on Flecanide and diltiazem. 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 Excell Seltzer and Dr. Laneta Simmers   Severe aortic stenosis 10/05/2020   Toe pain, left 04/15/2023   Past Surgical History:  Procedure Laterality Date   AMPUTATION TOE Right 03/02/2023   Procedure: AMPUTATION TOE, 4th;  Surgeon: Netta Cedars, MD;  Location: [facility] ORS;  Service: Orthopedics;  Laterality: Right;   APPENDECTOMY     BLADDER SURGERY  01/25/2006   transurethral resection/resection of prostatic urethra   BOWEL RESECTION     CARDIOVERSION N/A 03/03/2022   Procedure: CARDIOVERSION;  Surgeon: Jodelle Red, MD;  Location: [facility] ENDOSCOPY;  Service: Cardiovascular;  Laterality: N/A;   CARDIOVERSION N/A 03/12/2022   Procedure: CARDIOVERSION;  Surgeon: Thomasene Ripple, DO;  Location: [facility] ENDOSCOPY;  Service: Cardiovascular;  Laterality: N/A;   ESOPHAGOGASTRODUODENOSCOPY N/A 07/24/2013   Procedure: ESOPHAGOGASTRODUODENOSCOPY (EGD);  Surgeon: Hilarie Fredrickson, MD;  Location: [facility] ENDOSCOPY;  Service: Endoscopy;  Laterality: N/A;   INTRAOPERATIVE TRANSTHORACIC ECHOCARDIOGRAM N/A 01/27/2021   Procedure: INTRAOPERATIVE TRANSTHORACIC ECHOCARDIOGRAM;  Surgeon: Tonny Bollman, MD;  Location: [facility] INVASIVE CV LAB;  Service: Open Heart Surgery;  Laterality: N/A;   IR FLUORO GUIDE CV LINE RIGHT  03/04/2023   IR US GUIDE VASC ACCESS RIGHT  03/04/2023   laparoscopic surgery  01/25/2010   laser   (?)   NEPHRECTOMY  01/25/2005   partial, left   NEPHROSTOMY  01/25/2009   stent   NM MYOCAR PERF WALL MOTION  09/08/2010   Normal   PACEMAKER IMPLANT N/A 02/12/2021   Procedure: PACEMAKER IMPLANT;  Surgeon: Lanier Prude, MD;   Location: [facility] INVASIVE CV LAB;  Service: Cardiovascular;  Laterality: N/A;   RIGHT HEART CATH AND CORONARY ANGIOGRAPHY N/A 10/06/2020   Procedure: RIGHT HEART CATH AND CORONARY ANGIOGRAPHY;  Surgeon: Tonny Bollman, MD;  Location: [facility] INVASIVE CV LAB;  Service: Cardiovascular;  Laterality: N/A;   ROBOT ASSISTED LAPAROSCOPIC COMPLETE CYSTECT ILEAL CONDUIT     TRANSCATHETER AORTIC VALVE REPLACEMENT, TRANSFEMORAL Right 01/27/2021   Procedure: TRANSCATHETER AORTIC VALVE REPLACEMENT, TRANSFEMORAL;  Surgeon: Tonny Bollman, MD;  Location: [facility] INVASIVE CV LAB;  Service: Open Heart Surgery;  Laterality: Right;   US ECHOCARDIOGRAPHY  09/08/2010   mild diastolic dysfunction,mild dilated LA,mild MR,AI,mildly dilated aortic root    Scheduled Meds:  allopurinol  100 mg Oral Daily   ALPRAZolam  0.25 mg Oral QHS   amiodarone  200 mg Oral Daily   amLODipine  10 mg Oral Daily   aspirin EC  81 mg Oral Daily   atorvastatin  10 mg Oral Daily   carvedilol  12.5 mg Oral BID   Chlorhexidine Gluconate Cloth  6 each Topical Daily   Chlorhexidine Gluconate Cloth  Topical Once   docusate sodium  100 mg Oral BID   enoxaparin (LOVENOX) injection  30 mg Subcutaneous Q24H   hydrALAZINE  25 mg Oral BID   isosorbide mononitrate  30 mg Oral Daily   levothyroxine  75 mcg Oral QAC breakfast   mirtazapine  7.5 mg Oral QHS   sodium bicarbonate  1,300 mg Oral BID   sodium chloride flush  10-40 mL Intracatheter Q12H   Continuous Infusions:   ceFAZolin (ANCEF) IV     PRN Meds:.acetaminophen **OR** acetaminophen, bisacodyl, hydrALAZINE, ondansetron **OR** ondansetron (ZOFRAN) IV, oxyCODONE, polyethylene glycol, sodium chloride flush  Allergies  Allergen Reactions   Amoxicillin Other (See Comments)    Acute interstitial nephritis   Doxycycline Hives   Flomax [Tamsulosin Hcl] Other (See Comments)    Dizziness   Social History   Socioeconomic History   Marital status: Divorced    Spouse name: Not on file   Number  of children: 2   Years of education: Not on file   Highest education level: Not on file  Occupational History   Occupation: Runner, broadcasting/film/video  Tobacco Use   Smoking status: Never   Smokeless tobacco: Never  Vaping Use   Vaping status: Never Used  Substance and Sexual Activity   Alcohol use: Not Currently    Comment: occasionally   Drug use: No   Sexual activity: Not on file  Other Topics Concern   Not on file  Social History Narrative   Not on file   Social Drivers of Health   Financial Resource Strain: Not on file  Food Insecurity: No Food Insecurity (05/03/2023)   Hunger Vital Sign    Worried About Running Out of Food in the Last Year: Never true    Ran Out of Food in the Last Year: Never true  Transportation Needs: No Transportation Needs (05/03/2023)   PRAPARE - Administrator, Civil Service (Medical): No    Lack of Transportation (Non-Medical): No  Physical Activity: Not on file  Stress: Not on file  Social Connections: Unknown (05/03/2023)   Social Connection and Isolation Panel [NHANES]    Frequency of Communication with Friends and Family: Twice a week    Frequency of Social Gatherings with Friends and Family: Twice a week    Attends Religious Services: Patient declined    Database administrator or Organizations: No    Attends Engineer, structural: Never    Marital Status: Patient declined  Recent Concern: Social Connections - Moderately Isolated (03/12/2023)   Social Connection and Isolation Panel [NHANES]    Frequency of Communication with Friends and Family: More than three times a week    Frequency of Social Gatherings with Friends and Family: Once a week    Attends Religious Services: Never    Database administrator or Organizations: No    Attends Banker Meetings: Never    Marital Status: Living with partner  Intimate Partner Violence: Not At Risk (05/03/2023)   Humiliation, Afraid, Rape, and Kick questionnaire    Fear of Current or  Ex-Partner: No    Emotionally Abused: No    Physically Abused: No    Sexually Abused: No   Family History  Problem Relation Age of Onset   Ovarian cancer Mother    Colon cancer Mother    Colon cancer Father    Crohn's disease Son    Breast cancer Daughter    Colon cancer Brother    Vitals BP 139/77 (BP  Location: Right Arm)   Pulse 66   Temp (!) 97.4 F (36.3 C)   Resp 18   Ht 6\' 1"  (1.854 m)   Wt 85.3 kg   SpO2 98%   BMI 24.80 kg/m    Physical Exam Constitutional:  elderly male sitting in the bed, non toxic appearing, not in acute distress     Comments: HEENT wnl  Cardiovascular:     Rate and Rhythm: Normal rate and Irregular rhythm.     Heart sounds: s1s2  Pulmonary:     Effort: Pulmonary effort is normal.     Comments: Normal breath sounds   Abdominal:     Palpations: Abdomen is soft.     Tenderness: non distended and non tender   Musculoskeletal:        General: No swelling or tenderness in peripheral joints  Skin:    Comments: no rashes, Rt internal jugular tunneled CVC with no concerns, PPM pocket left chest OK with no concerns  Neurological:     General: awake, alert and oriented, grossly non focal  Psychiatric:        Mood and Affect: Mood normal.    Pertinent Microbiology Results for orders placed or performed during the hospital encounter of 03/01/23  Surgical PCR screen     Status: None   Collection Time: 03/02/23 12:53 AM   Specimen: Nasal Mucosa; Nasal Swab  Result Value Ref Range Status   MRSA, PCR NEGATIVE NEGATIVE Final   Staphylococcus aureus NEGATIVE NEGATIVE Final    Comment: (NOTE) The Xpert SA Assay (FDA approved for NASAL specimens in patients 62 years of age and older), is one component of a comprehensive surveillance program. It is not intended to diagnose infection nor to guide or monitor treatment. Performed at [facility], [address] [place], [place] [address]   Aerobic/Anaerobic Culture w Gram  Stain (surgical/deep wound)     Status: None   Collection Time: 03/02/23  4:37 PM   Specimen: Toe, Right; Amputation  Result Value Ref Range Status   Specimen Description   Final    TOE RIGHT 4TH TOE Performed at [facility], [address] [place], [place] [address]    Special Requests   Final    NONE Performed at [facility], [address] [place], [place] [address]    Gram Stain NO WBC SEEN NO ORGANISMS SEEN   Final   Culture   Final    No growth aerobically or anaerobically. Performed at [facility], [address], [place], [place] [address]    Report Status 03/07/2023 FINAL  Final   Pertinent Lab seen by me:    Latest Ref Rng & Units 05/04/2023    3:59 AM 05/03/2023    5:49 PM 03/11/2023    3:41 AM  CBC  WBC 4.0 - 10.5 K/uL 3.7  3.3  4.6   Hemoglobin 13.0 - 17.0 g/dL 9.6  29.5  9.1   Hematocrit 39.0 - 52.0 % 31.3  32.7  29.3   Platelets 150 - 400 K/uL 70  73  107       Latest Ref Rng & Units 05/04/2023    3:59 AM 05/03/2023    5:49 PM 03/11/2023    3:41 AM  CMP  Glucose 70 - 99 mg/dL 89  621  90   BUN 8 - 23 mg/dL 55  58  72   Creatinine 0.61 - 1.24 mg/dL 3.08  6.57  2.61   Sodium 135 - 145 mmol/L 139  137  140   Potassium 3.5 - 5.1 mmol/L 4.5  4.0  4.6   Chloride 98 - 111 mmol/L 113  111  110   CO2 22 - 32 mmol/L 18  18  21    Calcium 8.9 - 10.3 mg/dL 8.8  9.0  9.3   Total Protein 6.5 - 8.1 g/dL   5.9   Total Bilirubin 0.0 - 1.2 mg/dL   0.4   Alkaline Phos 38 - 126 U/L   98   AST 15 - 41 U/L   59   ALT 0 - 44 U/L   83      Pertinent Imagings/Other Imagings Plain films and CT images have been personally visualized and interpreted; radiology reports have been reviewed. Decision making incorporated into the Impression / Recommendations.  MR FOOT LEFT WO CONTRAST Result Date: 04/26/2023 CLINICAL DATA:  Cellulitis of the left foot EXAM: MRI OF THE LEFT FOOT WITHOUT CONTRAST TECHNIQUE: Multiplanar, multisequence MR  imaging of the left foot was performed. No intravenous contrast was administered. COMPARISON:  Radiographs 03/07/2023 FINDINGS: Bones/Joint/Cartilage Abnormal marrow edema in the middle and distal phalanges of the fourth toe compatible with active osteomyelitis. Mild marrow edema in the tuft of the distal phalanx of the great toe such that early osteomyelitis in this location is not excluded. Minimal marrow edema in the bifid medial sesamoid, likely inflammatory/sesamoiditis but skeptical of infection involving the sesamoid. Ligaments Lisfranc ligament intact. Muscles and Tendons Moderate regional muscular atrophy. Soft tissues Mild dorsal subcutaneous edema in the forefoot. Subcutaneous edema in the fourth toe. No drainable abscess observed. IMPRESSION: 1. Osteomyelitis of the middle and distal phalanges of the fourth toe. 2. Mild marrow edema in the tuft of the distal phalanx of the great toe such that early osteomyelitis in this location is not excluded. 3. Minimal marrow edema in the bifid medial sesamoid, likely inflammatory/sesamoiditis but suspicious of infection involving the sesamoid. 4. Moderate regional muscular atrophy. 5. Mild dorsal subcutaneous edema in the forefoot. Electronically Signed   By: Gaylyn Rong M.D.   On: 04/26/2023 12:52    I have personally spent 85 minutes involved in face-to-face and non-face-to-face activities for this patient on the day of the visit. Professional time spent includes the following activities: Preparing to see the patient (review of tests), Obtaining and/or reviewing separately obtained history (admission/discharge record), Performing a medically appropriate examination and/or evaluation , Ordering medications/tests/procedures, referring and communicating with other health care professionals, Documenting clinical information in the EMR, Independently interpreting results (not separately reported), Communicating results to the patient/family/caregiver,  Counseling and educating the patient/family/caregiver and Care coordination (not separately reported).  Electronically signed by:   Plan d/w requesting provider as well as ID pharm D  Of note, portions of this note may have been created with voice recognition software. While this note has been edited for accuracy, occasional wrong-word or 'sound-a-like' substitutions may have occurred due to the inherent limitations of voice recognition software.   Odette Fraction, MD Infectious Disease Physician [facility] for Infectious Disease Pager: [phone]

## 2023-05-04 NOTE — Anesthesia Preprocedure Evaluation (Addendum)
 Anesthesia Evaluation  Patient identified by MRN, date of birth, ID band Patient awake    Reviewed: Allergy & Precautions, NPO status , Patient's Chart, lab work & pertinent test results, reviewed documented beta blocker date and time   History of Anesthesia Complications Negative for: history of anesthetic complications  Airway Mallampati: III  TM Distance: >3 FB Neck ROM: Full  Mouth opening: Limited Mouth Opening  Dental no notable dental hx.    Pulmonary neg pulmonary ROS   Pulmonary exam normal        Cardiovascular hypertension, Pt. on medications and Pt. on home beta blockers + Past MI and +CHF  Normal cardiovascular exam+ dysrhythmias Atrial Fibrillation + pacemaker   TTE 02/2023: EF 45-50%, moderate LVH, grade I DD, mild to moderate RAE, s/p AVR, mild dilatation of ascending aorta measuring 41mm    Neuro/Psych negative neurological ROS     GI/Hepatic Neg liver ROS,GERD  ,,  Endo/Other  Hypothyroidism    Renal/GU Renal InsufficiencyRenal disease     Musculoskeletal negative musculoskeletal ROS (+)    Abdominal   Peds  Hematology negative hematology ROS (+)   Anesthesia Other Findings Cellulitis of left foot  Reproductive/Obstetrics                             Anesthesia Physical Anesthesia Plan  ASA: 3  Anesthesia Plan: Regional and MAC   Post-op Pain Management: Tylenol PO (pre-op)*   Induction:   PONV Risk Score and Plan: 1 and Treatment may vary due to age or medical condition, Propofol infusion and Ondansetron  Airway Management Planned: Natural Airway and Nasal Cannula  Additional Equipment: None  Intra-op Plan:   Post-operative Plan:   Informed Consent: I have reviewed the patients History and Physical, chart, labs and discussed the procedure including the risks, benefits and alternatives for the proposed anesthesia with the patient or authorized representative  who has indicated his/her understanding and acceptance.       Plan Discussed with: CRNA  Anesthesia Plan Comments:        Anesthesia Quick Evaluation

## 2023-05-04 NOTE — Anesthesia Procedure Notes (Signed)
 Anesthesia Regional Block: Ankle block   Pre-Anesthetic Checklist: , timeout performed,  Correct Patient, Correct Site, Correct Laterality,  Correct Procedure, Correct Position, site marked,  Risks and benefits discussed,  Pre-op evaluation,  At surgeon's request and post-op pain management  Laterality: Left  Prep: Maximum Sterile Barrier Precautions used, chloraprep       Needles:  Injection technique: Single-shot  Needle Type: Echogenic Needle     Needle Length: 4cm  Needle Gauge: 25     Additional Needles:   Narrative:  Start time: 05/04/2023 12:52 PM End time: 05/04/2023 12:55 PM  Performed by: Personally  Anesthesiologist: Kaylyn Layer, MD  Additional Notes: Risks, benefits, and alternative discussed. Patient gave consent for procedure. Patient prepped and draped in sterile fashion. Sedation administered, patient remains easily responsive to voice. Local anesthetic given in 5cc increments with no signs or symptoms of intravascular injection. No pain or paraesthesias with injection. Patient monitored throughout procedure with signs of LAST or immediate complications. Tolerated well.   Jordan Greenhouse, MD

## 2023-05-04 NOTE — Progress Notes (Signed)
 Initial Nutrition Assessment  INTERVENTION:   -Ensure Plus High Protein po BID, each supplement provides 350 kcal and 20 grams of protein.   -1 packet Juven BID, each packet provides 95 calories, 2.5 grams of protein (collagen), and 9.8 grams of carbohydrate (3 grams sugar); also contains 7 grams of L-arginine and L-glutamine, 300 mg vitamin C, 15 mg vitamin E, 1.2 mcg vitamin B-12, 9.5 mg zinc, 200 mg calcium, and 1.5 g  Calcium Beta-hydroxy-Beta-methylbutyrate to support wound healing   -220 mg Zinc sulfate daily x 14 days  NUTRITION DIAGNOSIS:   Increased nutrient needs related to wound healing as evidenced by estimated needs.  GOAL:   Patient will meet greater than or equal to 90% of their needs  MONITOR:   PO intake, Supplement acceptance  REASON FOR ASSESSMENT:   Consult Wound healing  ASSESSMENT:   83 year old male with history of bladder cancer, stage IV CKD, BPH, hypertension, hyperlipidemia, A-fib, status post pacemaker, status post TAVR who presented for direct admission from orthopedics for evaluation of left foot osteomyelitis.  MRI confirmed osteomyelitis of 4th left which with done as outpatient.  He was scheduled for direct admission for amputation due to concerns of TAVR, pacemaker.  Patient currently in OR for scheduled toe amputation.  Pt consumed 100% of dinner last night. To aid in wound and post-op healing, will order Ensure supplements, Juven and daily zinc .  Per weight records, pt has lost 13 lbs since 2/15 (6% wt loss x <2 months, significant for time frame).  Medications: Colace, Remeron, Zofran  Labs reviewed.  NUTRITION - FOCUSED PHYSICAL EXAM:  Unable to complete  Diet Order:   Diet Order             Diet NPO time specified Except for: Sips with Meds  Diet effective now                   EDUCATION NEEDS:   Not appropriate for education at this time  Skin:  Skin Assessment: Skin Integrity Issues: Skin Integrity Issues:: Other  (Comment) Other: osteomyelitis of toe on left foot  Last BM:  4/7  Height:   Ht Readings from Last 1 Encounters:  05/04/23 6\' 1"  (1.854 m)    Weight:   Wt Readings from Last 1 Encounters:  05/04/23 85.3 kg    BMI:  Body mass index is 24.8 kg/m.  Estimated Nutritional Needs:   Kcal:  9604-5409  Protein:  105-115g  Fluid:  2L/day   Tilda Franco, MS, RD, LDN Inpatient Clinical Dietitian Contact via Secure chat

## 2023-05-04 NOTE — TOC Initial Note (Signed)
 Transition of Care [facility]) - Initial/Assessment Note    Patient Details  Name: Jordan Oconnell MRN: [id number] Date of Birth: January 03, 1941  [facility]) CM/SW Contact:    Diona Browner, LCSW Phone Number: 05/04/2023, 10:20 AM  Clinical Narrative:                 Pt from home alone. Pt continues medical workup. Pt currently has HHPT/OT/RN w/ [facility]; will need resumption of care HH orders prior to d/c. TOC following for d/c needs.  Expected Discharge Plan: Home/Self Care Barriers to Discharge: Continued Medical Work up   Patient Goals and CMS Choice Patient states their goals for this hospitalization and ongoing recovery are:: return home CMS Medicare.gov Compare Post Acute Care list provided to::  (NA) Choice offered to / list presented to : NA [place] ownership interest in [facility].provided to::  (NA)    Expected Discharge Plan and Services In-house Referral: NA     Living arrangements for the past 2 months: Single Family Home                 DME Arranged: N/A DME Agency: NA       HH Arranged: NA HH Agency: NA        Prior Living Arrangements/Services Living arrangements for the past 2 months: Single Family Home Lives with:: Self Patient language and need for interpreter reviewed:: Yes Do you feel safe going back to the place where you live?: Yes      Need for Family Participation in Patient Care: No (Comment) Care giver support system in place?: Yes (comment) Current home services: Home OT, Home PT, Home RN Criminal Activity/Legal Involvement Pertinent to Current Situation/Hospitalization: No - Comment as needed  Activities of Daily Living   ADL Screening (condition at time of admission) Independently performs ADLs?: Yes (appropriate for developmental age) Is the patient deaf or have difficulty hearing?: Yes Does the patient have difficulty seeing, even when wearing glasses/contacts?: No Does the patient have difficulty  concentrating, remembering, or making decisions?: No  Permission Sought/Granted                  Emotional Assessment Appearance:: Appears stated age Attitude/Demeanor/Rapport: Engaged Affect (typically observed): Accepting Orientation: : Oriented to Self, Oriented to Place, Oriented to  Time, Oriented to Situation Alcohol / Substance Use: Not Applicable Psych Involvement: No (comment)  Admission diagnosis:  Acute osteomyelitis involving ankle and foot (HCC) [M86.179] Patient Active Problem List   Diagnosis Date Noted   Osteomyelitis of fourth toe of left foot (HCC) 05/03/2023   Osteomyelitis of left foot (HCC) 04/26/2023   Toe pain, left 04/15/2023   Non-ST elevation (NSTEMI) myocardial infarction (HCC) 03/08/2023   Primary hypertension 03/08/2023   Osteomyelitis of fourth toe of right foot (HCC) 03/01/2023   Chronic combined systolic and diastolic CHF (congestive heart failure) (HCC) 03/01/2023   Hypothyroidism 03/01/2023   Acute renal failure superimposed on stage 4 chronic kidney disease (HCC) 03/13/2022   Acute on chronic systolic CHF (congestive heart failure) (HCC) 03/13/2022   Tachycardia-bradycardia syndrome (HCC) 03/11/2022   Atrial fibrillation with rapid ventricular response (HCC) 03/11/2022   Atypical atrial flutter (HCC) 03/03/2022   Pacemaker 02/12/2021   LBBB (left bundle branch block) 01/29/2021   1st degree AV block 01/29/2021   S/P TAVR (transcatheter aortic valve replacement) 01/27/2021   Severe aortic stenosis 10/05/2020   CKD (chronic kidney disease) stage 4, GFR 15-29 ml/min (HCC) 02/01/2017   Pneumonia 07/26/2013  Class: Acute   Intractable hiccups 07/22/2013    Class: Chronic   Anemia 07/22/2013    Class: Acute   Protein-calorie malnutrition, severe (HCC) 07/19/2013   Syncope and collapse 07/17/2013   Bladder cancer (HCC) 07/17/2013   Urothelial cancer (HCC) 09/03/2012   Essential hypertension 09/03/2012   PAF (paroxysmal atrial  fibrillation) (HCC) 09/03/2012   PCP:  Geoffry Paradise, MD Pharmacy:   [facility] [address] [place] - [address] [place] [address] Phone: [phone] Fax: [phone]     Social Drivers of Health (SDOH) Social History: SDOH Screenings   Food Insecurity: No Food Insecurity (05/03/2023)  Housing: Low Risk  (05/03/2023)  Transportation Needs: No Transportation Needs (05/03/2023)  Utilities: Not At Risk (05/03/2023)  Social Connections: Unknown (05/03/2023)  Recent Concern: Social Connections - Moderately Isolated (03/12/2023)  Tobacco Use: Low Risk  (05/03/2023)   SDOH Interventions:     Readmission Risk Interventions    05/04/2023   10:12 AM 03/08/2023   12:36 PM  Readmission Risk Prevention Plan  Transportation Screening Complete Complete  PCP or Specialist Appt within 3-5 Days Complete Complete  HRI or Home Care Consult Complete Complete  Social Work Consult for Recovery Care Planning/Counseling Complete Complete  Palliative Care Screening Not Applicable Not Applicable  Medication Review Oceanographer) Complete Complete

## 2023-05-04 NOTE — Transfer of Care (Signed)
 Immediate Anesthesia Transfer of Care Note  Patient: Jordan Oconnell  Procedure(s) Performed: AMPUTATION, TOE (Left: Toe)  Patient Location: PACU  Anesthesia Type:MAC  Level of Consciousness: sedated  Airway & Oxygen Therapy: Patient Spontanous Breathing  Post-op Assessment: Report given to RN  Post vital signs: Reviewed and stable  Last Vitals:  Vitals Value Taken Time  BP 110/61 05/04/23 1445  Temp    Pulse 62 05/04/23 1446  Resp 18 05/04/23 1446  SpO2 99 % 05/04/23 1446  Vitals shown include unfiled device data.  Last Pain:  Vitals:   05/04/23 1251  TempSrc:   PainSc: 0-No pain         Complications: No notable events documented.

## 2023-05-04 NOTE — H&P (Signed)
H&P Update:  -History and Physical Reviewed  -Patient has been re-examined  -No change in the plan of care  -The risks and benefits were presented and reviewed. The risks due to recurrent/new/persistent infection, stiffness, nerve/vessel/tendon injury or rerupture of repaired tendon, nonunion/malunion, allograft usage, wound healing issues, development of arthritis, failure of this surgery, possibility of external fixation with delayed definitive surgery, need for further surgery, thromboembolic events, anesthesia/medical complications, amputation, death among others were discussed. The patient acknowledged the explanation, agreed to proceed with the plan and a consent was signed.  Netta Cedars

## 2023-05-05 ENCOUNTER — Encounter (HOSPITAL_COMMUNITY): Payer: Self-pay | Admitting: Orthopaedic Surgery

## 2023-05-05 ENCOUNTER — Inpatient Hospital Stay (HOSPITAL_COMMUNITY)

## 2023-05-05 DIAGNOSIS — M869 Osteomyelitis, unspecified: Secondary | ICD-10-CM | POA: Diagnosis not present

## 2023-05-05 DIAGNOSIS — L97509 Non-pressure chronic ulcer of other part of unspecified foot with unspecified severity: Secondary | ICD-10-CM | POA: Diagnosis not present

## 2023-05-05 LAB — BASIC METABOLIC PANEL WITH GFR
Anion gap: 6 (ref 5–15)
BUN: 54 mg/dL — ABNORMAL HIGH (ref 8–23)
CO2: 19 mmol/L — ABNORMAL LOW (ref 22–32)
Calcium: 8.4 mg/dL — ABNORMAL LOW (ref 8.9–10.3)
Chloride: 114 mmol/L — ABNORMAL HIGH (ref 98–111)
Creatinine, Ser: 2.73 mg/dL — ABNORMAL HIGH (ref 0.61–1.24)
GFR, Estimated: 22 mL/min — ABNORMAL LOW (ref >60)
Glucose, Bld: 96 mg/dL (ref 70–99)
Potassium: 4.4 mmol/L (ref 3.5–5.1)
Sodium: 139 mmol/L (ref 135–145)

## 2023-05-05 LAB — CK: Total CK: 71 U/L (ref 49–397)

## 2023-05-05 NOTE — Evaluation (Signed)
 Physical Therapy Evaluation Patient Details Name: Jordan Oconnell MRN: [id number] DOB: [birth year] Today's Date: 05/05/2023  History of Present Illness  83 year old admitted with Left fourth toe extensive osteomyelitis involving proximal and middle phalanges with forefoot cellulitis now s/p left fourth toe disarticulation at metatarsophalangeal joint on 4/9.  Past medical history significant for previously hospitalized in 02/2023 for R 4th toe amputation, PAF on Eliquis, aortic stenosis status post TAVR, tachybradycardia syndrome, status post pacemaker, CKD stage IV, urothelial cancer status post multiple TURP with urostomy in place, RCC status post left nephrectomy with recurrent urothelial disease to solitary right renal pelvis, hypothyroidism  Clinical Impression  Patient is s/p above surgery resulting in functional limitations due to the deficits listed below (see PT Problem List).  Patient will benefit from acute skilled PT to increase their independence and safety with mobility to facilitate discharge.  Pt now admitted s/p L 4th toe amputation which recent history of R 4th toe amputation.  Pt requiring cues for maintaining heel weight bearing only and pt requesting "wedge shoe."  Pt anticipates return home at d/c and states his partner will not be in town this weekend but pt feels he will be independent with RW upon return home.  Pt would benefit from resuming HHPT upon d/c.  Pt states he believes he has only SW at home so would also benefit from RW (he plans to ask someone to check for him).         If plan is discharge home, recommend the following: Assistance with cooking/housework;Assist for transportation   Can travel by private vehicle        Equipment Recommendations Rolling walker (2 wheels)  Recommendations for Other Services       Functional Status Assessment Patient has had a recent decline in their functional status and demonstrates the ability to make significant  improvements in function in a reasonable and predictable amount of time.     Precautions / Restrictions Precautions Precautions: Fall Recall of Precautions/Restrictions: Impaired Precaution/Restrictions Comments: pt requesting post op shoe "wedge shoe" which he utilized after recent R 4th toe amputation - RN notified and messaging MD Restrictions Weight Bearing Restrictions Per Provider Order: Yes Other Position/Activity Restrictions: "strict heel WB operative extremity" per ortho note      Mobility  Bed Mobility Overal bed mobility: Modified Independent                  Transfers Overall transfer level: Needs assistance Equipment used: Rolling walker (2 wheels) Transfers: Sit to/from Stand Sit to Stand: Min assist           General transfer comment: light assist to rise, cues for positioning and maintaining Lt heel WBing    Ambulation/Gait Ambulation/Gait assistance: Contact guard assist Gait Distance (Feet): 50 Feet Assistive device: Rolling walker (2 wheels) Gait Pattern/deviations: Step-to pattern, Decreased stance time - left       General Gait Details: mod cues for step to Lt heel weight bearing only  Stairs            Wheelchair Mobility     Tilt Bed    Modified Rankin (Stroke Patients Only)       Balance                                             Pertinent Vitals/Pain Pain Assessment Pain Assessment: 0-10 Pain Score: 3  Pain Location: left foot Pain Descriptors / Indicators: Sore Pain Intervention(s): Repositioned, Monitored during session, Premedicated before session (elevated Lt LE)    Home Living Family/patient expects to be discharged to:: Private residence Living Arrangements: Alone   Type of Home: House Home Access: Level entry       Home Layout: One level Home Equipment: Cane - single point;Grab bars - tub/shower;Grab bars - toilet;Shower seat;Standard Environmental consultant      Prior Function Prior Level of  Function : Independent/Modified Independent                     Extremity/Trunk Assessment        Lower Extremity Assessment Lower Extremity Assessment: Generalized weakness       Communication   Communication Communication: Impaired Factors Affecting Communication: Hearing impaired    Cognition Arousal: Alert Behavior During Therapy: WFL for tasks assessed/performed   PT - Cognitive impairments: No apparent impairments                         Following commands: Intact       Cueing       General Comments      Exercises     Assessment/Plan    PT Assessment Patient needs continued PT services  PT Problem List Decreased strength;Decreased activity tolerance;Decreased balance;Decreased mobility;Pain;Decreased knowledge of use of DME;Decreased knowledge of precautions       PT Treatment Interventions Gait training;DME instruction;Functional mobility training;Therapeutic activities;Therapeutic exercise;Patient/family education    PT Goals (Current goals can be found in the Care Plan section)  Acute Rehab PT Goals PT Goal Formulation: With patient Time For Goal Achievement: 05/19/23 Potential to Achieve Goals: Good    Frequency Min 3X/week     Co-evaluation               AM-PAC PT "6 Clicks" Mobility  Outcome Measure Help needed turning from your back to your side while in a flat bed without using bedrails?: A Little Help needed moving from lying on your back to sitting on the side of a flat bed without using bedrails?: A Little Help needed moving to and from a bed to a chair (including a wheelchair)?: A Little Help needed standing up from a chair using your arms (e.g., wheelchair or bedside chair)?: A Little Help needed to walk in hospital room?: A Little Help needed climbing 3-5 steps with a railing? : A Little 6 Click Score: 18    End of Session Equipment Utilized During Treatment: Gait belt Activity Tolerance: Patient  tolerated treatment well Patient left: in bed;with call bell/phone within reach;with nursing/sitter in room Nurse Communication: Mobility status PT Visit Diagnosis: Difficulty in walking, not elsewhere classified (R26.2)    Time: 1610-9604 PT Time Calculation (min) (ACUTE ONLY): 21 min   Charges:   PT Evaluation $PT Eval Low Complexity: 1 Low   PT General Charges $$ ACUTE PT VISIT: 1 Visit        Thomasene Mohair PT, DPT Physical Therapist Acute Rehabilitation Services Office: [phone]   Janan Halter [place] 05/05/2023, 4:11 PM

## 2023-05-05 NOTE — Plan of Care (Signed)

## 2023-05-05 NOTE — Progress Notes (Signed)
 ABI has been completed.   Results can be found under chart review under CV PROC. 05/05/2023 10:39 AM Aunesti Pellegrino RVT, RDMS

## 2023-05-05 NOTE — Progress Notes (Signed)
 PROGRESS NOTE  Jordan Oconnell  UEA:[id number] DOB: [birth year] DOA: 05/03/2023 PCP: Geoffry Paradise, MD   Brief Narrative: Patient is a 83 year old male with history of bladder cancer, stage IV CKD, BPH, hypertension, hyperlipidemia, A-fib, status post pacemaker, status post TAVR who presented for direct admission from orthopedics for evaluation of left foot osteomyelitis.  MRI confirmed osteomyelitis of 4th left which with done as outpatient.  He was scheduled for direct admission for amputation due to concerns of TAVR, pacemaker.  Orthopedics consulted, status post left fourth toe disarticulation at metatarsophalangeal joint on 4/9.ID also following  Assessment & Plan:  Principal Problem:   Osteomyelitis of fourth toe of left foot (HCC) Active Problems:   Urothelial cancer (HCC)   PAF (paroxysmal atrial fibrillation) (HCC)   CKD (chronic kidney disease) stage 4, GFR 15-29 ml/min (HCC)   S/P TAVR (transcatheter aortic valve replacement)   Pacemaker   Chronic combined systolic and diastolic CHF (congestive heart failure) (HCC)   Hypothyroidism   Primary hypertension   Left fourth toe osteomyelitis: MRI findings were concerning for osteomyelitis.  Direct admission as per orthopedics.   ID also consulted and following.  Orthopedics consulted, status post left fourth toe disarticulation at metatarsophalangeal joint on 4/9.ID also following. On daptomycin, ceftriaxone.  Wound culture report does not show any organism or WBC. Also has history of right fourth toe amputation on 03/02/2023, finished antibiotics course with ceftriaxone, daptomycin.  PT consulted   History of coronary artery disease: Currently denies any anginal symptoms.  Last hospitalization, troponin was elevated to 400s.  He declined cardiac cath and opted to manage medically.  Last 2D echo showed EF of 45 to 50%, no wall motion abnormality, grade 1 diastolic dysfunction.  Continue aspirin, statin, Imdur  History of urothelial  cancer: Status post urostomy.  Status post left nephrectomy with recurrent disease in the solitary right kidney.  Follows with [facility], oncology and nephrology.  Continue urostomy care.  Next follow-up at [facility] EKG scheduled for 4/17.  Was taking pembrolizumab.  Paroxysmal A-fib/A-flutter/tachybradycardia syndrome: Status post pacemaker.  Continue carvedilol, amiodarone for rate control.  On Eliquis at home which was currently on hold for surgical intervention,will resume  Hypertension: On amlodipine, hydralazine.  Currently blood pressure stable  Chronic combined systolic/diastolic YNW:GNFA 2D echo showed EF of 45 to 50%, no wall motion abnormality, grade 1 diastolic dysfunction.  Currently appears euvolemic.  Continue current medications  Hypothyroidism: Continue Synthyroid  Severe aortic stenosis: Status post TAVR on 1/23  Abnormal ABI: ABI done here showed b/l noncompressible lower extremity arteries. B/L abnormal  toe-brachial index.  Can follow-up with vascular surgery as an outpatient  Vitamin B-12 deficiency: Takes supplementation at home   Nutrition Problem: Increased nutrient needs Etiology: wound healing    DVT prophylaxis:enoxaparin (LOVENOX) injection 30 mg Start: 05/04/23 1800     Code Status: Full Code  Family Communication: None at bedside  Patient status:Inpatient  Patient is from :Home  Anticipated discharge OZ:HYQM  Estimated DC date:after ID clearance   Consultants: Orthopedics,ID  Procedures:Left 4th  toe amputation  Antimicrobials:  Anti-infectives (From admission, onward)    Start     Dose/Rate Route Frequency Ordered Stop   05/04/23 1800  DAPTOmycin (CUBICIN) IVPB 700 mg/132mL premix        8 mg/kg  85.3 kg 200 mL/hr over 30 Minutes Intravenous Every 48 hours 05/04/23 1524     05/04/23 1730  cefTRIAXone (ROCEPHIN) 2 g in sodium chloride 0.9 % 100 mL IVPB  2 g 200 mL/hr over 30 Minutes Intravenous Every 24 hours 05/04/23 1524      05/04/23 1455  vancomycin (VANCOCIN) powder          As needed 05/04/23 1604     05/04/23 0745  ceFAZolin (ANCEF) IVPB 2g/100 mL premix        2 g 200 mL/hr over 30 Minutes Intravenous On call to O.R. 05/04/23 8295 05/04/23 1411       Subjective:  Patient seen and examined at bedside today.  Hemodynamically stable.  Comfortably lying on bed.  Denies any pain on the left foot.  No fever or chills.  Objective: Vitals:   05/04/23 1543 05/04/23 2013 05/05/23 0411 05/05/23 1130  BP: (!) 150/79 125/63 136/68 131/84  Pulse: 67 70 64 77  Resp: 18 19 18 16   Temp: 97.9 F (36.6 C) 98.1 F (36.7 C) 97.7 F (36.5 C) 97.8 F (36.6 C)  TempSrc: Oral Oral Oral   SpO2: 98% 97% 96% 96%  Weight:      Height:        Intake/Output Summary (Last 24 hours) at 05/05/2023 1341 Last data filed at 05/05/2023 0800 Gross per 24 hour  Intake 980 ml  Output 1710 ml  Net -730 ml   Filed Weights   05/03/23 1238 05/04/23 1140 05/04/23 1146  Weight: 85.3 kg 85.3 kg 85.3 kg    Examination:   General exam: Overall comfortable, not in distress, pleasant elderly male HEENT: PERRL Respiratory system:  no wheezes or crackles  Cardiovascular system: S1 & S2 heard, RRR.  Gastrointestinal system: Abdomen is nondistended, soft and nontender. Central nervous system: Alert and oriented Extremities: No edema, no clubbing ,no cyanosis, dressing on the left foot Skin: No rashes, no ulcers,no icterus     Data Reviewed: I have personally reviewed following labs and imaging studies  CBC: Recent Labs  Lab 05/03/23 1749 05/04/23 0359  WBC 3.3* 3.7*  NEUTROABS 1.7  --   HGB 10.2* 9.6*  HCT 32.7* 31.3*  MCV 100.3* 100.3*  PLT 73* 70*   Basic Metabolic Panel: Recent Labs  Lab 05/03/23 1749 05/04/23 0359 05/05/23 0208  NA 137 139 139  K 4.0 4.5 4.4  CL 111 113* 114*  CO2 18* 18* 19*  GLUCOSE 143* 89 96  BUN 58* 55* 54*  CREATININE 2.60* 2.58* 2.73*  CALCIUM 9.0 8.8* 8.4*     Recent Results  (from the past 240 hours)  Aerobic/Anaerobic Culture w Gram Stain (surgical/deep wound)     Status: None (Preliminary result)   Collection Time: 05/04/23  2:00 PM   Specimen: Toe, Left; Amputation  Result Value Ref Range Status   Specimen Description   Final    TOE LEFT 4TH TOE SPECIMEN A Performed at [facility], [address] [place], [place] [address]    Special Requests   Final    NONE Performed at [facility], [address] [place], [place] [address]    Gram Stain NO WBC SEEN NO ORGANISMS SEEN   Final   Culture   Final    NO GROWTH < 24 HOURS Performed at [facility], [address], [place], [place] [address]    Report Status PENDING  Incomplete  Aerobic/Anaerobic Culture w Gram Stain (surgical/deep wound)     Status: None (Preliminary result)   Collection Time: 05/04/23  2:22 PM   Specimen: Toe, Left; Amputation  Result Value Ref Range Status   Specimen Description  Final    TOE LEFT 4TH TOE CULTURE SPECIMEN B Performed at [facility], [address], [place], [place] [address]    Special Requests SWAB  Final   Gram Stain NO WBC SEEN NO ORGANISMS SEEN   Final   Culture   Final    NO GROWTH < 24 HOURS Performed at [facility], [address] [place], [place] [address]    Report Status PENDING  Incomplete     Radiology Studies: VAS [place] ABI WITH/WO TBI Result Date: 05/05/2023  LOWER EXTREMITY DOPPLER STUDY Patient Name:  Jordan Oconnell  Date of Exam:   05/05/2023 Medical Rec #: [id number]          Accession #:    [id number] Date of Birth: [birth year]          Patient Gender: M Patient Age:   52 years Exam Location:  [facility] Procedure:      VAS [place] ABI WITH/WO TBI Referring Phys: Victorino Dike YATES --------------------------------------------------------------------------------  Indications: Osteomyelitis left foot, 4th toe High Risk Factors: Hypertension, hyperlipidemia, no history of  smoking, prior                    MI. Other Factors: RLE 4th toe amputation (03/02/23), Afib, PM, CKD, s/p TAVR, CHF.  Comparison Study: No previous exams Performing Technologist: Hill, Jody RVT, RDMS  Examination Guidelines: A complete evaluation includes at minimum, Doppler waveform signals and systolic blood pressure reading at the level of bilateral brachial, anterior tibial, and posterior tibial arteries, when vessel segments are accessible. Bilateral testing is considered an integral part of a complete examination. Photoelectric Plethysmograph (PPG) waveforms and toe systolic pressure readings are included as required and additional duplex testing as needed. Limited examinations for reoccurring indications may be performed as noted.  ABI Findings: +---------+------------------+-----+----------+----------------+ Right    Rt Pressure (mmHg)IndexWaveform  Comment          +---------+------------------+-----+----------+----------------+ Brachial 164                    biphasic                   +---------+------------------+-----+----------+----------------+ PTA                             monophasicnon compressible +---------+------------------+-----+----------+----------------+ DP       152               0.93 monophasic                 +---------+------------------+-----+----------+----------------+ Great Toe56                0.34 Abnormal                   +---------+------------------+-----+----------+----------------+ +---------+------------------+-----+---------+-------+ Left     Lt Pressure (mmHg)IndexWaveform Comment +---------+------------------+-----+---------+-------+ Brachial 155                    triphasic        +---------+------------------+-----+---------+-------+ PTA      254               1.55 biphasic         +---------+------------------+-----+---------+-------+ DP       254               1.55 triphasic         +---------+------------------+-----+---------+-------+ Lenor Coffin  0.56 Abnormal         +---------+------------------+-----+---------+-------+ Arterial wall calcification precludes accurate ankle pressures and ABIs.  Summary: Right: Resting right ankle-brachial index indicates noncompressible right lower extremity arteries. The right toe-brachial index is abnormal. Left: Resting left ankle-brachial index indicates noncompressible left lower extremity arteries. The left toe-brachial index is abnormal. *See table(s) above for measurements and observations.  Electronically signed by Carolynn Sayers on 05/05/2023 at 10:48:15 AM.    Final     Scheduled Meds:  allopurinol  100 mg Oral Daily   ALPRAZolam  0.25 mg Oral QHS   amiodarone  200 mg Oral Daily   amLODipine  10 mg Oral Daily   aspirin EC  81 mg Oral Daily   atorvastatin  10 mg Oral Daily   carvedilol  12.5 mg Oral BID   Chlorhexidine Gluconate Cloth  6 each Topical Daily   docusate sodium  100 mg Oral BID   enoxaparin (LOVENOX) injection  30 mg Subcutaneous Q24H   feeding supplement  237 mL Oral BID BM   hydrALAZINE  25 mg Oral BID   isosorbide mononitrate  30 mg Oral Daily   levothyroxine  75 mcg Oral QAC breakfast   mirtazapine  7.5 mg Oral QHS   nutrition supplement (JUVEN)  1 packet Oral BID WC   sodium bicarbonate  1,300 mg Oral BID   sodium chloride flush  10-40 mL Intracatheter Q12H   zinc sulfate (50mg  elemental zinc)  220 mg Oral Daily   Continuous Infusions:  cefTRIAXone (ROCEPHIN)  IV 2 g (05/04/23 1633)   DAPTOmycin 700 mg (05/04/23 1759)     LOS: 2 days   Burnadette Pop, MD [facility] P4/10/2023, 1:41 PM

## 2023-05-06 DIAGNOSIS — M86172 Other acute osteomyelitis, left ankle and foot: Secondary | ICD-10-CM | POA: Diagnosis not present

## 2023-05-06 DIAGNOSIS — D696 Thrombocytopenia, unspecified: Secondary | ICD-10-CM | POA: Diagnosis not present

## 2023-05-06 DIAGNOSIS — M869 Osteomyelitis, unspecified: Secondary | ICD-10-CM | POA: Diagnosis not present

## 2023-05-06 LAB — CBC
HCT: 31 % — ABNORMAL LOW (ref 39.0–52.0)
Hemoglobin: 9.6 g/dL — ABNORMAL LOW (ref 13.0–17.0)
MCH: 31.2 pg (ref 26.0–34.0)
MCHC: 31 g/dL (ref 30.0–36.0)
MCV: 100.6 fL — ABNORMAL HIGH (ref 80.0–100.0)
Platelets: 65 10*3/uL — ABNORMAL LOW (ref 150–400)
RBC: 3.08 MIL/uL — ABNORMAL LOW (ref 4.22–5.81)
RDW: 15.2 % (ref 11.5–15.5)
WBC: 4.5 10*3/uL (ref 4.0–10.5)
nRBC: 0 % (ref 0.0–0.2)

## 2023-05-06 LAB — BASIC METABOLIC PANEL WITH GFR
Anion gap: 5 (ref 5–15)
BUN: 59 mg/dL — ABNORMAL HIGH (ref 8–23)
CO2: 19 mmol/L — ABNORMAL LOW (ref 22–32)
Calcium: 8.7 mg/dL — ABNORMAL LOW (ref 8.9–10.3)
Chloride: 111 mmol/L (ref 98–111)
Creatinine, Ser: 2.25 mg/dL — ABNORMAL HIGH (ref 0.61–1.24)
GFR, Estimated: 28 mL/min — ABNORMAL LOW (ref >60)
Glucose, Bld: 114 mg/dL — ABNORMAL HIGH (ref 70–99)
Potassium: 4.4 mmol/L (ref 3.5–5.1)
Sodium: 135 mmol/L (ref 135–145)

## 2023-05-06 MED ORDER — APIXABAN 2.5 MG PO TABS
2.5000 mg | ORAL_TABLET | Freq: Two times a day (BID) | ORAL | Status: DC
  Administered 2023-05-06 – 2023-05-09 (×7): 2.5 mg via ORAL
  Filled 2023-05-06 (×7): qty 1

## 2023-05-06 NOTE — Progress Notes (Signed)
 Physical Therapy Treatment Patient Details Name: Jordan Oconnell MRN: [id number] DOB: 1940-08-10 Today's Date: 05/06/2023   History of Present Illness 83 year old admitted with Left fourth toe extensive osteomyelitis involving proximal and middle phalanges with forefoot cellulitis now s/p left fourth toe disarticulation at metatarsophalangeal joint on 4/9.  Past medical history significant for previously hospitalized in 02/2023 for R 4th toe amputation, PAF on Eliquis, aortic stenosis status post TAVR, tachybradycardia syndrome, status post pacemaker, CKD stage IV, urothelial cancer status post multiple TURP with urostomy in place, RCC status post left nephrectomy with recurrent urothelial disease to solitary right renal pelvis, hypothyroidism    PT Comments  Pt requiring more assist today but still agreeable and motivated.  Pt's daughter present and observed.  Pt requiring increased cues for heel weight bearing only and thus therapist limited ambulation distance today.  Pt agreeable to remain OOB in recliner and Lt LE elevated.  Pt does not anticipate d/c until Sunday at earliest, so he is hopeful he can progress activity before d/c.  Pt encouraged to transition OOB to/from recliner with staff only (pt wondering if daughter could help mobilize).     If plan is discharge home, recommend the following: Assistance with cooking/housework;Assist for transportation;A little help with walking and/or transfers   Can travel by private vehicle        Equipment Recommendations  Rolling walker (2 wheels)    Recommendations for Other Services       Precautions / Restrictions Precautions Precautions: Fall Recall of Precautions/Restrictions: Impaired Precaution/Restrictions Comments: pt requesting post op shoe "wedge shoe" which he utilized after recent R 4th toe amputation - no order or shoe in room today Restrictions Weight Bearing Restrictions Per Provider Order: Yes Other Position/Activity  Restrictions: "strict heel WB operative extremity" per ortho note     Mobility  Bed Mobility Overal bed mobility: Modified Independent             General bed mobility comments: increased time and effort    Transfers Overall transfer level: Needs assistance Equipment used: Rolling walker (2 wheels) Transfers: Sit to/from Stand Sit to Stand: Mod assist, From elevated surface           General transfer comment: verbal cues for positioning and technique; attempted x3 as pt unable to perform without mod assist for rise and stabilize, posterior lean upon standing today    Ambulation/Gait Ambulation/Gait assistance: Min assist Gait Distance (Feet): 28 Feet Assistive device: Rolling walker (2 wheels) Gait Pattern/deviations: Step-to pattern, Decreased stance time - left       General Gait Details: max cues for step to Lt heel weight bearing only, distance limited as pt not adhering well today but also requiring more assist   Stairs             Wheelchair Mobility     Tilt Bed    Modified Rankin (Stroke Patients Only)       Balance                                            Communication Communication Communication: Impaired Factors Affecting Communication: Hearing impaired  Cognition Arousal: Alert Behavior During Therapy: WFL for tasks assessed/performed   PT - Cognitive impairments: No apparent impairments                         Following  commands: Intact      Cueing    Exercises      General Comments        Pertinent Vitals/Pain Pain Assessment Pain Assessment: 0-10 Pain Score: 3  Pain Location: left foot Pain Descriptors / Indicators: Sore Pain Intervention(s): Repositioned, Monitored during session    Home Living                          Prior Function            PT Goals (current goals can now be found in the care plan section) Progress towards PT goals: Progressing toward goals     Frequency    Min 3X/week      PT Plan      Co-evaluation              AM-PAC PT "6 Clicks" Mobility   Outcome Measure  Help needed turning from your back to your side while in a flat bed without using bedrails?: A Little Help needed moving from lying on your back to sitting on the side of a flat bed without using bedrails?: A Little Help needed moving to and from a bed to a chair (including a wheelchair)?: A Lot Help needed standing up from a chair using your arms (e.g., wheelchair or bedside chair)?: A Lot Help needed to walk in hospital room?: A Little Help needed climbing 3-5 steps with a railing? : A Lot 6 Click Score: 15    End of Session Equipment Utilized During Treatment: Gait belt Activity Tolerance: Patient limited by fatigue Patient left: in chair;with call bell/phone within reach;with family/visitor present;with chair alarm set Nurse Communication: Mobility status PT Visit Diagnosis: Difficulty in walking, not elsewhere classified (R26.2)     Time: 9629-5284 PT Time Calculation (min) (ACUTE ONLY): 20 min  Charges:    $Gait Training: 8-22 mins PT General Charges $$ ACUTE PT VISIT: 1 Visit                    Paulino Door, DPT Physical Therapist Acute Rehabilitation Services Office: [phone]    Janan Halter Payson 05/06/2023, 12:52 PM

## 2023-05-06 NOTE — Plan of Care (Signed)
   Problem: Education: Goal: Knowledge of General Education information will improve Description Including pain rating scale, medication(s)/side effects and non-pharmacologic comfort measures Outcome: Progressing   Problem: Health Behavior/Discharge Planning: Goal: Ability to manage health-related needs will improve Outcome: Progressing

## 2023-05-06 NOTE — Progress Notes (Signed)
 PROGRESS NOTE  Jordan Oconnell  UEA:[id number] DOB: 11-Jan-1941 DOA: 05/03/2023 PCP: Geoffry Paradise, MD   Brief Narrative: Patient is a 83 year old male with history of bladder cancer, stage IV CKD, BPH, hypertension, hyperlipidemia, A-fib, status post pacemaker, status post TAVR who presented for direct admission from orthopedics for evaluation of left foot osteomyelitis.  MRI confirmed osteomyelitis of 4th left which with done as outpatient.  He was scheduled for direct admission for amputation due to concerns of TAVR, pacemaker.  Orthopedics consulted, status post left fourth toe disarticulation at metatarsophalangeal joint on 4/9.ID also following.  Pending wound culture  Assessment & Plan:  Principal Problem:   Osteomyelitis of fourth toe of left foot (HCC) Active Problems:   Urothelial cancer (HCC)   PAF (paroxysmal atrial fibrillation) (HCC)   CKD (chronic kidney disease) stage 4, GFR 15-29 ml/min (HCC)   S/P TAVR (transcatheter aortic valve replacement)   Pacemaker   Chronic combined systolic and diastolic CHF (congestive heart failure) (HCC)   Hypothyroidism   Primary hypertension   Left fourth toe osteomyelitis: MRI findings were concerning for osteomyelitis.  Direct admission as per orthopedics.   ID also consulted and following.  Orthopedics consulted, status post left fourth toe disarticulation at metatarsophalangeal joint on 4/9.ID also following. On daptomycin, ceftriaxone.  Wound culture report does not show any organism or WBC. Also has history of right fourth toe amputation on 03/02/2023, finished antibiotics course with ceftriaxone, daptomycin.  Discussed with ID today.  ID wants to wait until Sunday if the culture grows anything.  Decision will be made on Sunday either to send him home with oral or iv antibiotics PT consulted, recommended home health  History of coronary artery disease: Currently denies any anginal symptoms.  Last hospitalization, troponin was elevated  to 400s.  He declined cardiac cath and opted to manage medically.  Last 2D echo showed EF of 45 to 50%, no wall motion abnormality, grade 1 diastolic dysfunction.  Continue aspirin, statin, Imdur  History of urothelial cancer: Status post urostomy.  Status post left nephrectomy with recurrent disease in the solitary right kidney.  Follows with [facility], oncology and nephrology.  Continue urostomy care.  Next follow-up at [facility] EKG scheduled for 4/17.  Was taking pembrolizumab.  There is some concern for bleeding around the ostomy site.  Wound care  ostomy consulted  Paroxysmal A-fib/A-flutter/tachybradycardia syndrome: Status post pacemaker.  Continue carvedilol, amiodarone for rate control.  On Eliquis at home ,will resume  Hypertension: On amlodipine, hydralazine.  Currently blood pressure stable  Chronic combined systolic/diastolic YNW:GNFA 2D echo showed EF of 45 to 50%, no wall motion abnormality, grade 1 diastolic dysfunction.  Currently appears euvolemic.  Continue current medications  Hypothyroidism: Continue Synthyroid  Severe aortic stenosis: Status post TAVR on 1/23  Abnormal ABI: ABI done here showed b/l noncompressible lower extremity arteries. B/L abnormal  toe-brachial index.  Consulting vascular surgery  Vitamin B-12 deficiency: Takes supplementation at home   Nutrition Problem: Increased nutrient needs Etiology: wound healing    DVT prophylaxis:apixaban (ELIQUIS) tablet 2.5 mg Start: 05/06/23 1200 apixaban (ELIQUIS) tablet 2.5 mg     Code Status: Full Code  Family Communication: Daughter at bedside  Patient status:Inpatient  Patient is from :Home  Anticipated discharge OZ:HYQM  Estimated DC date:after ID clearance   Consultants: Orthopedics,ID  Procedures:Left 4th  toe amputation  Antimicrobials:  Anti-infectives (From admission, onward)    Start     Dose/Rate Route Frequency Ordered Stop   05/04/23 1800  DAPTOmycin (CUBICIN)  IVPB 700 mg/147mL  premix        8 mg/kg  85.3 kg 200 mL/hr over 30 Minutes Intravenous Every 48 hours 05/04/23 1524     05/04/23 1730  cefTRIAXone (ROCEPHIN) 2 g in sodium chloride 0.9 % 100 mL IVPB        2 g 200 mL/hr over 30 Minutes Intravenous Every 24 hours 05/04/23 1524     05/04/23 1455  vancomycin (VANCOCIN) powder          As needed 05/04/23 1604     05/04/23 0745  ceFAZolin (ANCEF) IVPB 2g/100 mL premix        2 g 200 mL/hr over 30 Minutes Intravenous On call to O.R. 05/04/23 1610 05/04/23 1411       Subjective:  Patient seen and examined at bedside today.  Hemodynamically stable.  Comfortable during my evaluation.  No new complaints.  Denied any significant pain in the left foot.  Daughter was at bedside, discussed about management plan.  Objective: Vitals:   05/05/23 2016 05/06/23 0605 05/06/23 0909 05/06/23 1127  BP: (!) 141/68 (!) 144/70 (!) 144/70 127/67  Pulse: 78 73 73 69  Resp: 16 17  18   Temp: 98.5 F (36.9 C) 98 F (36.7 C)  98 F (36.7 C)  TempSrc: Oral Oral  Oral  SpO2: 95% 95%  96%  Weight:      Height:        Intake/Output Summary (Last 24 hours) at 05/06/2023 1157 Last data filed at 05/06/2023 1100 Gross per 24 hour  Intake 548.95 ml  Output 3100 ml  Net -2551.05 ml   Filed Weights   05/03/23 1238 05/04/23 1140 05/04/23 1146  Weight: 85.3 kg 85.3 kg 85.3 kg    Examination:   General exam: Overall comfortable, not in distress, pleasant elderly male HEENT: PERRL Respiratory system:  no wheezes or crackles  Cardiovascular system: S1 & S2 heard, RRR.  Gastrointestinal system: Abdomen is nondistended, soft and nontender.  Urostomy Central nervous system: Alert and oriented Extremities: No edema, no clubbing ,no cyanosis, dressing in the left foot Skin: No rashes, no ulcers,no icterus     Data Reviewed: I have personally reviewed following labs and imaging studies  CBC: Recent Labs  Lab 05/03/23 1749 05/04/23 0359 05/06/23 0241  WBC 3.3* 3.7* 4.5   NEUTROABS 1.7  --   --   HGB 10.2* 9.6* 9.6*  HCT 32.7* 31.3* 31.0*  MCV 100.3* 100.3* 100.6*  PLT 73* 70* 65*   Basic Metabolic Panel: Recent Labs  Lab 05/03/23 1749 05/04/23 0359 05/05/23 0208 05/06/23 0241  NA 137 139 139 135  K 4.0 4.5 4.4 4.4  CL 111 113* 114* 111  CO2 18* 18* 19* 19*  GLUCOSE 143* 89 96 114*  BUN 58* 55* 54* 59*  CREATININE 2.60* 2.58* 2.73* 2.25*  CALCIUM 9.0 8.8* 8.4* 8.7*     Recent Results (from the past 240 hours)  Aerobic/Anaerobic Culture w Gram Stain (surgical/deep wound)     Status: None (Preliminary result)   Collection Time: 05/04/23  2:00 PM   Specimen: Toe, Left; Amputation  Result Value Ref Range Status   Specimen Description   Final    TOE LEFT 4TH TOE SPECIMEN A Performed at [facility], [address], [place], [place] [address]    Special Requests   Final    NONE Performed at [facility], [address], [place], [place] [address]    Gram Stain NO WBC  SEEN NO ORGANISMS SEEN   Final   Culture   Final    NO GROWTH 2 DAYS Performed at [facility], [address], [place], [place] [address]    Report Status PENDING  Incomplete  Aerobic/Anaerobic Culture w Gram Stain (surgical/deep wound)     Status: None (Preliminary result)   Collection Time: 05/04/23  2:22 PM   Specimen: Toe, Left; Amputation  Result Value Ref Range Status   Specimen Description   Final    TOE LEFT 4TH TOE CULTURE SPECIMEN B Performed at [facility], [address], [place], [place] [address]    Special Requests SWAB  Final   Gram Stain NO WBC SEEN NO ORGANISMS SEEN   Final   Culture   Final    NO GROWTH 2 DAYS Performed at [facility], [address] [place], [place] [address]    Report Status PENDING  Incomplete     Radiology Studies: VAS [place] ABI WITH/WO TBI Result Date: 05/05/2023  LOWER EXTREMITY DOPPLER STUDY Patient Name:  SUMMIT ARROYAVE  Date of Exam:    05/05/2023 Medical Rec #: [id number]          Accession #:    [id number] Date of Birth: [birth year]          Patient Gender: M Patient Age:   54 years Exam Location:  [facility] Procedure:      VAS [place] ABI WITH/WO TBI Referring Phys: Victorino Dike YATES --------------------------------------------------------------------------------  Indications: Osteomyelitis left foot, 4th toe High Risk Factors: Hypertension, hyperlipidemia, no history of smoking, prior                    MI. Other Factors: RLE 4th toe amputation (03/02/23), Afib, PM, CKD, s/p TAVR, CHF.  Comparison Study: No previous exams Performing Technologist: Hill, Jody RVT, RDMS  Examination Guidelines: A complete evaluation includes at minimum, Doppler waveform signals and systolic blood pressure reading at the level of bilateral brachial, anterior tibial, and posterior tibial arteries, when vessel segments are accessible. Bilateral testing is considered an integral part of a complete examination. Photoelectric Plethysmograph (PPG) waveforms and toe systolic pressure readings are included as required and additional duplex testing as needed. Limited examinations for reoccurring indications may be performed as noted.  ABI Findings: +---------+------------------+-----+----------+----------------+ Right    Rt Pressure (mmHg)IndexWaveform  Comment          +---------+------------------+-----+----------+----------------+ Brachial 164                    biphasic                   +---------+------------------+-----+----------+----------------+ PTA                             monophasicnon compressible +---------+------------------+-----+----------+----------------+ DP       152               0.93 monophasic                 +---------+------------------+-----+----------+----------------+ Great Toe56                0.34 Abnormal                   +---------+------------------+-----+----------+----------------+  +---------+------------------+-----+---------+-------+ Left     Lt Pressure (mmHg)IndexWaveform Comment +---------+------------------+-----+---------+-------+ Brachial 155  triphasic        +---------+------------------+-----+---------+-------+ PTA      254               1.55 biphasic         +---------+------------------+-----+---------+-------+ DP       254               1.55 triphasic        +---------+------------------+-----+---------+-------+ Great Toe92                0.56 Abnormal         +---------+------------------+-----+---------+-------+ Arterial wall calcification precludes accurate ankle pressures and ABIs.  Summary: Right: Resting right ankle-brachial index indicates noncompressible right lower extremity arteries. The right toe-brachial index is abnormal. Left: Resting left ankle-brachial index indicates noncompressible left lower extremity arteries. The left toe-brachial index is abnormal. *See table(s) above for measurements and observations.  Electronically signed by Carolynn Sayers on 05/05/2023 at 10:48:15 AM.    Final     Scheduled Meds:  allopurinol  100 mg Oral Daily   ALPRAZolam  0.25 mg Oral QHS   amiodarone  200 mg Oral Daily   amLODipine  10 mg Oral Daily   apixaban  2.5 mg Oral BID   aspirin EC  81 mg Oral Daily   atorvastatin  10 mg Oral Daily   carvedilol  12.5 mg Oral BID   Chlorhexidine Gluconate Cloth  6 each Topical Daily   docusate sodium  100 mg Oral BID   feeding supplement  237 mL Oral BID BM   hydrALAZINE  25 mg Oral BID   isosorbide mononitrate  30 mg Oral Daily   levothyroxine  75 mcg Oral QAC breakfast   mirtazapine  7.5 mg Oral QHS   nutrition supplement (JUVEN)  1 packet Oral BID WC   sodium bicarbonate  1,300 mg Oral BID   sodium chloride flush  10-40 mL Intracatheter Q12H   zinc sulfate (50mg  elemental zinc)  220 mg Oral Daily   Continuous Infusions:  cefTRIAXone (ROCEPHIN)  IV 200 mL/hr at 05/06/23  0444   DAPTOmycin Stopped (05/04/23 1828)     LOS: 3 days   Burnadette Pop, MD [facility] P4/11/2023, 11:57 AM

## 2023-05-06 NOTE — Progress Notes (Signed)
 I was called regarding Jordan Oconnell, specifically his recent surgery involving the left foot.  Recent ABI demonstrated triphasic waveforms with toe pressure of 92. Per hospital medicine, the patient has a palpable pulse in the foot.  With a palpable pulse in the foot and a toe pressure of 92, he should have the necessary perfusion to heal his wound.  My plan is to see Jordan Oconnell in the outpatient setting in 1 month to assess wound healing. Should healing stagnate or wound deteriorate, would move forward with diagnostic angiography with possible intervention. I would like to hold on this at this time due to CKD 4, and the risk of contrast pushing him into renal failure.   Follow up scheduled  [name] MD

## 2023-05-06 NOTE — Progress Notes (Addendum)
 [facility] Infectious Diseases Follow Up Note  Patient Identification: Patient Name: Jordan Oconnell MRN: [id number] Admit Date: 05/03/2023 11:25 AM Age: 83 y.o.Today's Date: 05/06/2023  Reason for Visit: Left foot infection  Principal Problem:   Osteomyelitis of fourth toe of left foot (HCC) Active Problems:   Urothelial cancer (HCC)   PAF (paroxysmal atrial fibrillation) (HCC)   CKD (chronic kidney disease) stage 4, GFR 15-29 ml/min (HCC)   S/P TAVR (transcatheter aortic valve replacement)   Pacemaker   Chronic combined systolic and diastolic CHF (congestive heart failure) (HCC)   Hypothyroidism   Primary hypertension   Antibiotics:  Daptomycin/ceftriaxone 4/9-c Total days of antibiotics 3  Lines/Hardwares: Rt internal jugular CVC  Interval Events: Remains afebrile Labs remarkable for creatinine 2.25, hemoglobin 9.6, platelets 65  Assessment 83 year old male with prior history of Urothelial cancer s/p TURP, cystectomy with ileal conduit, CKD in the context of RCC s/p partial left nephrectomy,  BPH, GERD, HLD, HTN, PAF, TachyBrachy Syndrome s/p PPM, aortic stenosis s/p TAVR, s/p right fourth toe disarticulation at [facility] for osteomyelitis of right fourth toe status 03/02/23  (Cx NG) post treatment with IV ceftriaxone and daptomycin, EOT 04/13/23  with   # Left 4th toe osteomyelitis, possible early osteomyelitis of  left great toe as well as sesamoid - antibiotics on hold for prior to OR - 4/9 left fourth toe disarticulation at the MTP joint. OR cx NGTD. Per OR note, no signs of infection remaining in the surgical site following amputation, however recommending 6 weeks course due to extensive bone infection. - 4/11 ABI bilateral incompressible arteries  # MRI showing mild marrow oedema in the Left great toe as well as sesamoid with ? Early osteomyelitis  - no cellulitis or wound in these areas    # Chronic Thrombocytopenia -  monitor    # CVC - will decide need for need of access pending cx growth   Recommendations -Discussed with him for possibility of discharge today on IV antibiotics for via central line pending culture growth.  However he is concerned discharge over the weekend as nobody would be available to help him at home.  I defer removing central line now pending cx just in case something grows for which no feasible p.o. options available   - Would keep him  in house until Sunday and follow  operative cultures and possibly transition to PO antibiotics that time - No change in abtx today, monitor CBC and CMP - Following cultures peripherally -Universal/standard isolation precautions Discussed with primary team  Rest of the management as per the primary team. Thank you for the consult. Please page with pertinent questions or concerns.  ______________________________________________________________________ Subjective patient seen and examined at the bedside. Discussed abtx plan with IV  vs PO.  He has no other concerns  Vitals BP (!) 144/70   Pulse 73   Temp 98 F (36.7 C) (Oral)   Resp 17   Ht 6\' 1"  (1.854 m)   Wt 85.3 kg   SpO2 95%   BMI 24.80 kg/m     Physical Exam Constitutional: Elderly male sitting up in the bed and having breakfast    Comments: HEENT WNL  Cardiovascular:     Rate and Rhythm: Normal rate and regular rhythm.     Heart sounds: S1 and S2  Pulmonary:     Effort: Pulmonary effort is normal.     Comments:   Abdominal:     Palpations: Abdomen is soft.     Tenderness:  Musculoskeletal:        General: No swelling or tenderness.  Left foot is bandaged C/D/I, no surrounding cellulitis  Skin:    Comments: No rashes  Neurological:     General: Awake, alert and oriented, grossly nonfocal  Psychiatric:        Mood and Affect: Mood normal.   Pertinent Microbiology Results for orders placed or performed during the hospital encounter of 05/03/23   Aerobic/Anaerobic Culture w Gram Stain (surgical/deep wound)     Status: None (Preliminary result)   Collection Time: 05/04/23  2:00 PM   Specimen: Toe, Left; Amputation  Result Value Ref Range Status   Specimen Description   Final    TOE LEFT 4TH TOE SPECIMEN A Performed at [facility], [address], [place], [place] [address]    Special Requests   Final    NONE Performed at [facility], [address], [place], [place] [address]    Gram Stain NO WBC SEEN NO ORGANISMS SEEN   Final   Culture   Final    NO GROWTH 2 DAYS Performed at [facility], [address], [place], [place] [address]    Report Status PENDING  Incomplete  Aerobic/Anaerobic Culture w Gram Stain (surgical/deep wound)     Status: None (Preliminary result)   Collection Time: 05/04/23  2:22 PM   Specimen: Toe, Left; Amputation  Result Value Ref Range Status   Specimen Description   Final    TOE LEFT 4TH TOE CULTURE SPECIMEN B Performed at [facility], [address], [place], [place] [address]    Special Requests SWAB  Final   Gram Stain NO WBC SEEN NO ORGANISMS SEEN   Final   Culture   Final    NO GROWTH 2 DAYS Performed at [facility], [address], [place], [place] [address]    Report Status PENDING  Incomplete   Pertinent Lab.    Latest Ref Rng & Units 05/06/2023    2:41 AM 05/04/2023    3:59 AM 05/03/2023    5:49 PM  CBC  WBC 4.0 - 10.5 K/uL 4.5  3.7  3.3   Hemoglobin 13.0 - 17.0 g/dL 9.6  9.6  57.8   Hematocrit 39.0 - 52.0 % 31.0  31.3  32.7   Platelets 150 - 400 K/uL 65  70  73       Latest Ref Rng & Units 05/06/2023    2:41 AM 05/05/2023    2:08 AM 05/04/2023    3:59 AM  CMP  Glucose 70 - 99 mg/dL 469  96  89   BUN 8 - 23 mg/dL 59  54  55   Creatinine 0.61 - 1.24 mg/dL 6.29  5.28  4.13   Sodium 135 - 145 mmol/L 135  139  139   Potassium 3.5 - 5.1 mmol/L 4.4  4.4  4.5   Chloride 98 - 111 mmol/L 111  114  113   CO2  22 - 32 mmol/L 19  19  18    Calcium 8.9 - 10.3 mg/dL 8.7  8.4  8.8      Pertinent Imaging today Plain films and CT images have been personally visualized and interpreted; radiology reports have been reviewed. Decision making incorporated into the Impression /   VAS US  ABI WITH/WO TBI Result Date: 05/05/2023  LOWER EXTREMITY DOPPLER STUDY Patient Name:  Jordan Oconnell  Date of Exam:   05/05/2023 Medical Rec #:  [id number]          Accession #:    [id number] Date of Birth: 1940/07/26          Patient Gender: M Patient Age:   68 years Exam Location:  [facility] Procedure:      VAS US  ABI WITH/WO TBI Referring Phys: Bridgette Campus YATES --------------------------------------------------------------------------------  Indications: Osteomyelitis left foot, 4th toe High Risk Factors: Hypertension, hyperlipidemia, no history of smoking, prior                    MI. Other Factors: RLE 4th toe amputation (03/02/23), Afib, PM, CKD, s/p TAVR, CHF.  Comparison Study: No previous exams Performing Technologist: Hill, Jody RVT, RDMS  Examination Guidelines: A complete evaluation includes at minimum, Doppler waveform signals and systolic blood pressure reading at the level of bilateral brachial, anterior tibial, and posterior tibial arteries, when vessel segments are accessible. Bilateral testing is considered an integral part of a complete examination. Photoelectric Plethysmograph (PPG) waveforms and toe systolic pressure readings are included as required and additional duplex testing as needed. Limited examinations for reoccurring indications may be performed as noted.  ABI Findings: +---------+------------------+-----+----------+----------------+ Right    Rt Pressure (mmHg)IndexWaveform  Comment          +---------+------------------+-----+----------+----------------+ Brachial 164                    biphasic                   +---------+------------------+-----+----------+----------------+ PTA                              monophasicnon compressible +---------+------------------+-----+----------+----------------+ DP       152               0.93 monophasic                 +---------+------------------+-----+----------+----------------+ Great Toe56                0.34 Abnormal                   +---------+------------------+-----+----------+----------------+ +---------+------------------+-----+---------+-------+ Left     Lt Pressure (mmHg)IndexWaveform Comment +---------+------------------+-----+---------+-------+ Brachial 155                    triphasic        +---------+------------------+-----+---------+-------+ PTA      254               1.55 biphasic         +---------+------------------+-----+---------+-------+ DP       254               1.55 triphasic        +---------+------------------+-----+---------+-------+ Great Toe92                0.56 Abnormal         +---------+------------------+-----+---------+-------+ Arterial wall calcification precludes accurate ankle pressures and ABIs.  Summary: Right: Resting right ankle-brachial index indicates noncompressible right lower extremity arteries. The right toe-brachial index is abnormal. Left: Resting left ankle-brachial index indicates noncompressible left lower extremity arteries. The left toe-brachial index is abnormal. *See table(s) above for measurements and observations.  Electronically signed by Delaney Fearing on 05/05/2023 at 10:48:15 AM.    Final      I have personally spent 36 minutes involved in face-to-face and non-face-to-face activities for this patient on the day of the visit. Professional time  spent includes the following activities: Preparing to see the patient (review of tests), Obtaining and/or reviewing separately obtained history (admission/discharge record), Performing a medically appropriate examination and/or evaluation , Ordering medications/tests/procedures, referring and communicating with  other health care professionals, Documenting clinical information in the EMR, Independently interpreting results (not separately reported), Communicating results to the patient/family/caregiver, Counseling and educating the patient/family/caregiver and Care coordination (not separately reported).   Plan d/w requesting provider as well as ID pharm D  Of note, portions of this note may have been created with voice recognition software. While this note has been edited for accuracy, occasional wrong-word or 'sound-a-like' substitutions may have occurred due to the inherent limitations of voice recognition software.   Electronically signed by:   Terre Ferri, MD Infectious Disease Physician [facility] Pager: [phone]

## 2023-05-06 NOTE — Consult Note (Signed)
 WOC Nurse ostomy consult note Stoma type/location:  ileal conduit since 2018. At outside facility. Last seen by [facility] nursing team 03/11/23.  At that time he was using 2pc convex Convatec pouch.  We do not carry this manufacturer inpatient.  Stomal assessment/size: 1 3/8" round, pink, moist Peristomal assessment: NA, pouch intact Discussed consult with patient, it is for the blood in his urine, no issues with his skin. Patient self reports skin is intact and healthy. Treatment options for stomal/peristomal skin: patient using no sting barrier wipes  Output clear yellow urine Ostomy pouching: 2pc. Convex urinary pouch Education provided:  none patient independent in care.  Enrolled patient in [place] Secure Start DC program: NA, patient using Convatec Patient has 3 sets of pouches in the room for use. He is currently hooked to BSD, which he is familiar with hooking and unhooking.  Updated ostomy care orders.  Patient will use home supplies Notified MD, consult for blood in the urine is medically treated similar to patient with normal urinary tract.     Re consult if needed, will not follow at this time. Thanks  Edan Juday [employer], RN,CWOCN, CNS, CWON-AP [phone])

## 2023-05-06 NOTE — Plan of Care (Signed)

## 2023-05-06 NOTE — Progress Notes (Signed)
 Orthopedic Tech Progress Note Patient Details:  Jordan Oconnell 1940/02/21 [id number]  Ortho Devices Type of Ortho Device: Darco shoe Ortho Device/Splint Location: Left foot Ortho Device/Splint Interventions: Application   Post Interventions Patient Tolerated: Well  Genelle Bal Paton Crum 05/06/2023, 2:51 PM

## 2023-05-07 DIAGNOSIS — M869 Osteomyelitis, unspecified: Secondary | ICD-10-CM | POA: Diagnosis not present

## 2023-05-07 LAB — URIC ACID: Uric Acid, Serum: 5.2 mg/dL (ref 3.7–8.6)

## 2023-05-07 MED ORDER — ACETAMINOPHEN 500 MG PO TABS
1000.0000 mg | ORAL_TABLET | Freq: Four times a day (QID) | ORAL | Status: DC
  Administered 2023-05-07 – 2023-05-12 (×18): 1000 mg via ORAL
  Filled 2023-05-07 (×18): qty 2

## 2023-05-07 NOTE — Plan of Care (Signed)
   Problem: Education: Goal: Knowledge of General Education information will improve Description Including pain rating scale, medication(s)/side effects and non-pharmacologic comfort measures Outcome: Progressing   Problem: Health Behavior/Discharge Planning: Goal: Ability to manage health-related needs will improve Outcome: Progressing

## 2023-05-07 NOTE — Plan of Care (Signed)

## 2023-05-07 NOTE — Progress Notes (Signed)
 Progress Note   Patient: JAYLENE SCHROM AVW:[id number] DOB: May 07, 1940 DOA: 05/03/2023     4 DOS: the patient was seen and examined on 05/07/2023   Brief hospital course: Patient is a 83 year old male with history of bladder cancer, stage IV CKD, BPH, hypertension, hyperlipidemia, A-fib, status post pacemaker, status post TAVR who presented for direct admission from orthopedics for evaluation of left foot osteomyelitis.  MRI confirmed osteomyelitis of 4th left which with done as outpatient.  He was scheduled for direct admission for amputation due to concerns of TAVR, pacemaker.  Orthopedics consulted, status post left fourth toe disarticulation at metatarsophalangeal joint on 4/9.ID also following.  Pending wound culture and determination of optimal antibiotic choice, route, and duration by ID.  Assessment and Plan: Principal Problem:   Osteomyelitis of fourth toe of left foot (HCC) Active Problems:   Urothelial cancer (HCC)   PAF (paroxysmal atrial fibrillation) (HCC)   CKD (chronic kidney disease) stage 4, GFR 15-29 ml/min (HCC)   S/P TAVR (transcatheter aortic valve replacement)   Pacemaker   Chronic combined systolic and diastolic CHF (congestive heart failure) (HCC)   Hypothyroidism   Primary hypertension     Left fourth toe osteomyelitis: MRI findings were concerning for osteomyelitis.  Direct admission as per orthopedics.   ID also consulted and following.  Orthopedics consulted, status post left fourth toe disarticulation at metatarsophalangeal joint on 4/9.ID also following. On daptomycin, ceftriaxone.  Wound culture report does not show any organism or WBC. Also has history of right fourth toe amputation on 03/02/2023, finished antibiotics course with ceftriaxone, daptomycin.  Discussed with ID today.  ID wants to wait until Sunday if the culture grows anything.  Decision will be made on Sunday either to send him home with oral or iv antibiotics PT consulted, recommended home  health   History of coronary artery disease: Currently denies any anginal symptoms.  Last hospitalization, troponin was elevated to 400s.  He declined cardiac cath and opted to manage medically.  Last 2D echo showed EF of 45 to 50%, no wall motion abnormality, grade 1 diastolic dysfunction.  Continue aspirin, statin, Imdur   History of urothelial cancer: Status post urostomy.  Status post left nephrectomy with recurrent disease in the solitary right kidney.  Follows with [facility], oncology and nephrology.  Continue urostomy care.  Next follow-up at [facility] EKG scheduled for 4/17.  Was taking pembrolizumab.  There is some concern for bleeding around the ostomy site.  Wound care  ostomy consulted   Paroxysmal A-fib/A-flutter/tachybradycardia syndrome: Status post pacemaker.  Continue carvedilol, amiodarone for rate control.  On Eliquis at home ,will resume   Hypertension: On amlodipine, hydralazine.  Currently blood pressure stable   Chronic combined systolic/diastolic WGN:FAOZ 2D echo showed EF of 45 to 50%, no wall motion abnormality, grade 1 diastolic dysfunction.  Currently appears euvolemic.  Continue current medications   Hypothyroidism: Continue Synthyroid   Severe aortic stenosis: Status post TAVR on 1/23   Abnormal ABI: ABI done here showed b/l noncompressible lower extremity arteries. B/L abnormal  toe-brachial index.  Consulting vascular surgery   Vitamin B-12 deficiency: Takes supplementation at home     Nutrition Problem: Increased nutrient needs Etiology: wound healing   DVT prophylaxis:apixaban (ELIQUIS) tablet 2.5 mg Start: 05/06/23 1200 apixaban (ELIQUIS) tablet 2.5 mg      Code Status: Full Code   Family Communication: Daughter at bedside   Patient status:Inpatient   Patient is from :Home   Anticipated discharge HY:QMVH   Estimated DC  date:after ID clearance     Consultants: Orthopedics,ID   Procedures:Left 4th  toe amputation   Antimicrobials:   Anti-infectives(From admission, onward)        Subjective: The patient is sitting up in chair. He wants to get back in bed, and is complaining bitterly of bilateral knee and leg pain with sitting up.   Physical Exam: Vitals:   05/07/23 0426 05/07/23 1045 05/07/23 1046 05/07/23 1202  BP: 134/65 134/65 135/65 105/82  Pulse: 70  70 87  Resp: 17   18  Temp: 98.9 F (37.2 C)   (!) 97.5 F (36.4 C)  TempSrc: Oral   Oral  SpO2: 95%   100%  Weight:      Height:       Exam:  Constitutional:  The patient is awake, alert, and oriented x 3. He is in moderate distress due to pain with sitting up in legs and knees bilaterally. Respiratory:  No increased work of breathing. No wheezes, rales, or rhonchi No tactile fremitus Cardiovascular:  Regular rate and rhythm No murmurs, ectopy, or gallups. No lateral PMI. No thrills. Abdomen:  Abdomen is soft, non-tender, non-distended No hernias, masses, or organomegaly Normoactive bowel sounds.  Musculoskeletal:  No cyanosis, clubbing, or edema Left lower extremity/foot is bandaged. Skin:  No rashes, lesions, ulcers palpation of skin: no induration or nodules Neurologic:  CN 2-12 intact Sensation all 4 extremities intact Psychiatric:  Mental status Mood, affect appropriate Orientation to person, place, time  judgment and insight appear intact  Data Reviewed:  CBC BMP Uric acid  Family Communication: Daughter is at bedside.  Disposition: Status is: Inpatient Remains inpatient appropriate because: Pt with osteomyelitis without definitive cultures or antibiotic plan in place. Patient will undoubtedly require rehab.  Planned Discharge Destination: Rehab    Time spent: 36 minutes  Author: Saheed Carrington, DO 05/07/2023 4:51 PM  For on call review [email or web address].

## 2023-05-07 NOTE — Progress Notes (Signed)
 Physical Therapy Treatment Patient Details Name: Jordan Oconnell MRN: [id number] DOB: [birth year] Today's Date: 05/07/2023   History of Present Illness 83 year old admitted with Left fourth toe extensive osteomyelitis involving proximal and middle phalanges with forefoot cellulitis now s/p left fourth toe disarticulation at metatarsophalangeal joint on 4/9.  Past medical history significant for previously hospitalized in 02/2023 for R 4th toe amputation, PAF on Eliquis, aortic stenosis status post TAVR, tachybradycardia syndrome, status post pacemaker, CKD stage IV, urothelial cancer status post multiple TURP with urostomy in place, RCC status post left nephrectomy with recurrent urothelial disease to solitary right renal pelvis, hypothyroidism    PT Comments  Pt with significant bilateral knee pain today (right greater than left).  Pt assisted by nursing earlier today with transfers to/from recliner, and pt requiring increased assist due to pain.  Pt did attempt to perform bed mobility and initiated however unable to continue due to bil knee pain. Pt's knees are warm to touch and painful with AAROM (right much more painful and limited).  Pt reports hx of gout flare with previous toe amputation.  RN notified of performance and concerns.  Daughter also present for session.  Daughter and pt updated on change of d/c recommendations as pt is not capable of mobilizing independently to return home without assist.  Patient will benefit from continued inpatient follow up therapy, <3 hours/day.    If plan is discharge home, recommend the following: Assistance with cooking/housework;Assist for transportation;Two people to help with walking and/or transfers;A lot of help with bathing/dressing/bathroom   Can travel by private vehicle     No  Equipment Recommendations  Rolling walker (2 wheels)    Recommendations for Other Services       Precautions / Restrictions Precautions Precautions: Fall Recall of  Precautions/Restrictions: Impaired Precaution/Restrictions Comments: Lt darco shoe Restrictions Weight Bearing Restrictions Per Provider Order: Yes Other Position/Activity Restrictions: "strict heel WB operative extremity" per ortho note     Mobility  Bed Mobility Overal bed mobility: Needs Assistance Bed Mobility: Supine to Sit, Sit to Supine     Supine to sit: Max assist     General bed mobility comments: likely significant assist however pt unable to fully come to sitting due to extreme bil knee pain even with assist for LEs    Transfers                        Ambulation/Gait                   Stairs             Wheelchair Mobility     Tilt Bed    Modified Rankin (Stroke Patients Only)       Balance                                            Communication Communication Communication: Impaired Factors Affecting Communication: Hearing impaired  Cognition Arousal: Alert Behavior During Therapy: WFL for tasks assessed/performed   PT - Cognitive impairments: No apparent impairments                         Following commands: Intact      Cueing    Exercises      General Comments        Pertinent Vitals/Pain Pain Assessment  Pain Assessment: 0-10 Pain Score: 10-Worst pain ever Pain Location: bil knees but right worse then left Pain Descriptors / Indicators: Tender, Sore, Grimacing, Guarding Pain Intervention(s): Repositioned, Monitored during session    Home Living                          Prior Function            PT Goals (current goals can now be found in the care plan section) Progress towards PT goals: Not progressing toward goals - comment    Frequency    Min 3X/week      PT Plan      Co-evaluation              AM-PAC PT "6 Clicks" Mobility   Outcome Measure  Help needed turning from your back to your side while in a flat bed without using bedrails?: A  Lot Help needed moving from lying on your back to sitting on the side of a flat bed without using bedrails?: Total Help needed moving to and from a bed to a chair (including a wheelchair)?: Total Help needed standing up from a chair using your arms (e.g., wheelchair or bedside chair)?: Total Help needed to walk in hospital room?: Total Help needed climbing 3-5 steps with a railing? : Total 6 Click Score: 7    End of Session   Activity Tolerance: Patient limited by pain Patient left: with call bell/phone within reach;with family/visitor present;in bed Nurse Communication: Mobility status PT Visit Diagnosis: Difficulty in walking, not elsewhere classified (R26.2)     Time: 1432-1500 PT Time Calculation (min) (ACUTE ONLY): 28 min  Charges:    $Therapeutic Activity: 8-22 mins PT General Charges $$ ACUTE PT VISIT: 1 Visit                    Blanch Bunde, DPT Physical Therapist Acute Rehabilitation Services Office: [phone]   Myna Asal Payson 05/07/2023, 3:45 PM

## 2023-05-08 DIAGNOSIS — D696 Thrombocytopenia, unspecified: Secondary | ICD-10-CM | POA: Diagnosis not present

## 2023-05-08 DIAGNOSIS — M86172 Other acute osteomyelitis, left ankle and foot: Secondary | ICD-10-CM | POA: Diagnosis not present

## 2023-05-08 DIAGNOSIS — M869 Osteomyelitis, unspecified: Secondary | ICD-10-CM | POA: Diagnosis not present

## 2023-05-08 LAB — CBC WITH DIFFERENTIAL/PLATELET
Abs Immature Granulocytes: 0.18 10*3/uL — ABNORMAL HIGH (ref 0.00–0.07)
Basophils Absolute: 0 10*3/uL (ref 0.0–0.1)
Basophils Relative: 0 %
Eosinophils Absolute: 0 10*3/uL (ref 0.0–0.5)
Eosinophils Relative: 1 %
HCT: 30.4 % — ABNORMAL LOW (ref 39.0–52.0)
Hemoglobin: 9.3 g/dL — ABNORMAL LOW (ref 13.0–17.0)
Immature Granulocytes: 3 %
Lymphocytes Relative: 15 %
Lymphs Abs: 0.9 10*3/uL (ref 0.7–4.0)
MCH: 30.8 pg (ref 26.0–34.0)
MCHC: 30.6 g/dL (ref 30.0–36.0)
MCV: 100.7 fL — ABNORMAL HIGH (ref 80.0–100.0)
Monocytes Absolute: 1.1 10*3/uL — ABNORMAL HIGH (ref 0.1–1.0)
Monocytes Relative: 19 %
Neutro Abs: 3.6 10*3/uL (ref 1.7–7.7)
Neutrophils Relative %: 62 %
Platelets: 64 10*3/uL — ABNORMAL LOW (ref 150–400)
RBC: 3.02 MIL/uL — ABNORMAL LOW (ref 4.22–5.81)
RDW: 15.3 % (ref 11.5–15.5)
WBC: 5.8 10*3/uL (ref 4.0–10.5)
nRBC: 0 % (ref 0.0–0.2)

## 2023-05-08 LAB — BASIC METABOLIC PANEL WITH GFR
Anion gap: 6 (ref 5–15)
BUN: 79 mg/dL — ABNORMAL HIGH (ref 8–23)
CO2: 20 mmol/L — ABNORMAL LOW (ref 22–32)
Calcium: 9.2 mg/dL (ref 8.9–10.3)
Chloride: 108 mmol/L (ref 98–111)
Creatinine, Ser: 2.5 mg/dL — ABNORMAL HIGH (ref 0.61–1.24)
GFR, Estimated: 25 mL/min — ABNORMAL LOW (ref >60)
Glucose, Bld: 113 mg/dL — ABNORMAL HIGH (ref 70–99)
Potassium: 4.3 mmol/L (ref 3.5–5.1)
Sodium: 134 mmol/L — ABNORMAL LOW (ref 135–145)

## 2023-05-08 MED ORDER — SODIUM CHLORIDE 0.9 % IV SOLN
2.0000 g | INTRAVENOUS | Status: AC
  Administered 2023-05-09: 2 g via INTRAVENOUS
  Filled 2023-05-08: qty 20

## 2023-05-08 MED ORDER — CEFADROXIL 500 MG PO CAPS
500.0000 mg | ORAL_CAPSULE | Freq: Two times a day (BID) | ORAL | Status: DC
  Administered 2023-05-10 – 2023-05-12 (×5): 500 mg via ORAL
  Filled 2023-05-08 (×5): qty 1

## 2023-05-08 MED ORDER — LINEZOLID 600 MG PO TABS
600.0000 mg | ORAL_TABLET | Freq: Two times a day (BID) | ORAL | Status: DC
  Administered 2023-05-10 – 2023-05-12 (×5): 600 mg via ORAL
  Filled 2023-05-08 (×5): qty 1

## 2023-05-08 MED ORDER — DAPTOMYCIN-SODIUM CHLORIDE 700-0.9 MG/100ML-% IV SOLN: 8.0000 mg/kg | INTRAVENOUS | Status: DC

## 2023-05-08 NOTE — Plan of Care (Signed)

## 2023-05-08 NOTE — Progress Notes (Addendum)
 [facility] Infectious Diseases Follow Up Note  Patient Identification: Patient Name: Jordan Oconnell MRN: [id number] Admit Date: 05/03/2023 11:25 AM Age: 83 y.o.Today's Date: 05/08/2023  Reason for Visit: Left foot infection  Principal Problem:   Osteomyelitis of fourth toe of left foot (HCC) Active Problems:   Urothelial cancer (HCC)   PAF (paroxysmal atrial fibrillation) (HCC)   CKD (chronic kidney disease) stage 4, GFR 15-29 ml/min (HCC)   S/P TAVR (transcatheter aortic valve replacement)   Pacemaker   Chronic combined systolic and diastolic CHF (congestive heart failure) (HCC)   Hypothyroidism   Primary hypertension   Antibiotics:  Daptomycin/ceftriaxone 4/9-c Total days of antibiotics 5  Lines/Hardwares: Rt internal jugular CVC  Interval Events: Remains afebrile Labs remarkable for creatinine 2.5, WBC 5.8, hemoglobin 9.3, platelets 64  Assessment 83 year old male with prior history of Urothelial cancer s/p TURP, cystectomy with ileal conduit, CKD in the context of RCC s/p partial left nephrectomy,  BPH, GERD, HLD, HTN, PAF, TachyBrachy Syndrome s/p PPM, aortic stenosis s/p TAVR, s/p right fourth toe disarticulation at [facility] for osteomyelitis of right fourth toe status 03/02/23  (Cx NG) post treatment with IV ceftriaxone and daptomycin, EOT 04/13/23  with   # Left 4th toe osteomyelitis, possible early osteomyelitis of  left great toe as well as sesamoid - antibiotics on hold for prior to OR - 4/9 left fourth toe disarticulation at the [facility] joint. OR cx NGTD. Per OR note, no signs of infection remaining in the surgical site following amputation, however recommending 6 weeks course due to extensive bone infection. OR Cx NG in 4 days  - 4/11 ABI bilateral incompressible arteries  # MRI showing mild marrow oedema in the Left great toe as well as sesamoid with ? Early osteomyelitis  - No cellulitis or wound in these areas. Per Dr  Cherl Corner, he has a history of trauma to that toe, so it appears to be a resolving contusion. Unlikely osteomyelitis    # Chronic Thrombocytopenia - monitor    # CKD- monitor Cr  Recommendations DC tunneled line  Will switch IV antibiotics to doxycycline and cefadroxil to complete 2 weeks course post operatively. Given surgical source control, no need for 6 weeks duration.   Addendum 7:06 pm, doxycycline allergy noted, will do PO linezolid and cefadroxil starting tomorrow for the same duration. EOT 4/22. No significant DDI for linezolid. Do not expect thrombocytopenia worse with  short duration of less than 2 weeks. D/w Pharm D.   Fu appt arranged at [facility] on 4/21 at 3: 45 pm  Fu with Ortho as instructed ID will so, recall back with questions, concerns.   Rest of the management as per the primary team. Thank you for the consult. Please page with pertinent questions or concerns.  ______________________________________________________________________ Subjective patient seen and examined at the bedside.  Friend at bedside, discussed no growth in cultures and possibly switching to PO abtx if no growth in cultures today and removal of central line.  Vitals BP 129/71 (BP Location: Right Arm)   Pulse 70   Temp 98.1 F (36.7 C) (Oral)   Resp 18   Ht 6\' 1"  (1.854 m)   Wt 85.3 kg   SpO2 97%   BMI 24.80 kg/m     Physical Exam Constitutional: Elderly male lying in the bed, hard of hearing    Comments: HEENT WNL  Cardiovascular:     Rate and Rhythm: Normal rate and regular rhythm.     Heart sounds:   Pulmonary:  Effort: Pulmonary effort is normal.     Comments:   Abdominal:     Palpations: Abdomen is soft.     Tenderness:   Musculoskeletal:        General: No swelling or tenderness.  Left foot is bandaged C/D/I, no surrounding cellulitis, no cellulitis or open wound in the left great toe.  Right chest CVC with no signs of infection  Skin:    Comments: No  rashes  Neurological:     General: Awake, alert and oriented, grossly nonfocal  Psychiatric:        Mood and Affect: Mood normal.   Pertinent Microbiology Results for orders placed or performed during the hospital encounter of 05/03/23  Aerobic/Anaerobic Culture w Gram Stain (surgical/deep wound)     Status: None (Preliminary result)   Collection Time: 05/04/23  2:00 PM   Specimen: Toe, Left; Amputation  Result Value Ref Range Status   Specimen Description   Final    TOE LEFT 4TH TOE SPECIMEN A Performed at [facility], [address], [place], [place] [address]    Special Requests   Final    NONE Performed at [facility], [address] [place], [place] [address]    Gram Stain NO WBC SEEN NO ORGANISMS SEEN   Final   Culture   Final    NO GROWTH 3 DAYS NO ANAEROBES ISOLATED; CULTURE IN PROGRESS FOR 5 DAYS Performed at [facility], [address], [place], [place] [address]    Report Status PENDING  Incomplete  Aerobic/Anaerobic Culture w Gram Stain (surgical/deep wound)     Status: None (Preliminary result)   Collection Time: 05/04/23  2:22 PM   Specimen: Toe, Left; Amputation  Result Value Ref Range Status   Specimen Description   Final    TOE LEFT 4TH TOE CULTURE SPECIMEN B Performed at [facility], [address], [place], [place] [address]    Special Requests SWAB  Final   Gram Stain NO WBC SEEN NO ORGANISMS SEEN   Final   Culture   Final    NO GROWTH 3 DAYS NO ANAEROBES ISOLATED; CULTURE IN PROGRESS FOR 5 DAYS Performed at [facility], [address] [place], [place] [address]    Report Status PENDING  Incomplete   Pertinent Lab.    Latest Ref Rng & Units 05/08/2023    4:29 AM 05/06/2023    2:41 AM 05/04/2023    3:59 AM  CBC  WBC 4.0 - 10.5 K/uL 5.8  4.5  3.7   Hemoglobin 13.0 - 17.0 g/dL 9.3  9.6  9.6   Hematocrit 39.0 - 52.0 % 30.4  31.0  31.3   Platelets 150 - 400 K/uL 64  65  70        Latest Ref Rng & Units 05/08/2023    4:29 AM 05/06/2023    2:41 AM 05/05/2023    2:08 AM  CMP  Glucose 70 - 99 mg/dL 324  401  96   BUN 8 - 23 mg/dL 79  59  54   Creatinine 0.61 - 1.24 mg/dL 0.27  2.53  6.64   Sodium 135 - 145 mmol/L 134  135  139   Potassium 3.5 - 5.1 mmol/L 4.3  4.4  4.4   Chloride 98 - 111 mmol/L 108  111  114   CO2 22 - 32 mmol/L 20  19  19    Calcium 8.9 - 10.3 mg/dL 9.2  8.7  8.4      Pertinent Imaging today Plain films and CT images have been personally visualized and interpreted; radiology reports have been reviewed. Decision making incorporated into the Impression /   No results found.   I have personally spent 36 minutes involved in face-to-face and non-face-to-face activities for this patient on the day of the visit. Professional time spent includes the following activities: Preparing to see the patient (review of tests), Obtaining and/or reviewing separately obtained history (admission/discharge record), Performing a medically appropriate examination and/or evaluation , Ordering medications/tests/procedures, referring and communicating with other health care professionals, Documenting clinical information in the EMR, Independently interpreting results (not separately reported), Communicating results to the patient/family/caregiver, Counseling and educating the patient/family/caregiver and Care coordination (not separately reported).   Plan d/w requesting provider as well as ID pharm D  Of note, portions of this note may have been created with voice recognition software. While this note has been edited for accuracy, occasional wrong-word or 'sound-a-like' substitutions may have occurred due to the inherent limitations of voice recognition software.   Electronically signed by:   Terre Ferri, MD Infectious Disease Physician [facility] Infect[facility] Pager: [phone]

## 2023-05-08 NOTE — Progress Notes (Signed)
 Progress Note   Patient: Jordan Oconnell XBJ:[id number] DOB: 21-Oct-1940 DOA: 05/03/2023     5 DOS: the patient was seen and examined on 05/08/2023   Brief hospital course: Patient is a 83 year old male with history of bladder cancer, stage IV CKD, BPH, hypertension, hyperlipidemia, A-fib, status post pacemaker, status post TAVR who presented for direct admission from orthopedics for evaluation of left foot osteomyelitis.  MRI confirmed osteomyelitis of 4th left which with done as outpatient.  He was scheduled for direct admission for amputation due to concerns of TAVR, pacemaker.  Orthopedics consulted, status post left fourth toe disarticulation at metatarsophalangeal joint on 4/9.ID also following.  Pending wound culture and determination of optimal antibiotic choice, route, and duration by ID.  Assessment and Plan: Principal Problem:   Osteomyelitis of fourth toe of left foot (HCC) Active Problems:   Urothelial cancer (HCC)   PAF (paroxysmal atrial fibrillation) (HCC)   CKD (chronic kidney disease) stage 4, GFR 15-29 ml/min (HCC)   S/P TAVR (transcatheter aortic valve replacement)   Pacemaker   Chronic combined systolic and diastolic CHF (congestive heart failure) (HCC)   Hypothyroidism   Primary hypertension     Left fourth toe osteomyelitis: MRI findings were concerning for osteomyelitis.  Direct admission as per orthopedics.   ID also consulted and following.  Orthopedics consulted, status post left fourth toe disarticulation at metatarsophalangeal joint on 4/9.ID also following. On daptomycin, ceftriaxone.  Wound culture report does not show any organism or WBC. Also has history of right fourth toe amputation on 03/02/2023, finished antibiotics course with ceftriaxone, daptomycin.  Discussed with ID today.  ID wants to wait until Sunday if the culture grows anything.  Decision will be made on Sunday either to send him home with oral or iv antibiotics PT consulted, recommended home  health. ID has decided to continue IV antibiotics for now. When antibiotics are converted to oral will arrange for removal of tunneled catheter. Plan is to change to oral linezolid and cefadroxil on 05/09/2023 with the end of therapy on 05/17/2023. He will need to follow up with ID as outpatient on 05/16/2023.    History of coronary artery disease: Currently denies any anginal symptoms.  Last hospitalization, troponin was elevated to 400s.  He declined cardiac cath and opted to manage medically.  Last 2D echo showed EF of 45 to 50%, no wall motion abnormality, grade 1 diastolic dysfunction.  Continue aspirin, statin, Imdur   History of urothelial cancer: Status post urostomy.  Status post left nephrectomy with recurrent disease in the solitary right kidney.  Follows with [facility], oncology and nephrology.  Continue urostomy care.  Next follow-up at [facility] EKG scheduled for 4/17.  Was taking pembrolizumab.  There is some concern for bleeding around the ostomy site.  Wound care  ostomy consulted.   Paroxysmal A-fib/A-flutter/tachybradycardia syndrome: Status post pacemaker.  Continue carvedilol, amiodarone for rate control.  On Eliquis at home ,will resume   Hypertension: On amlodipine, hydralazine.  Currently blood pressure stable   Chronic combined systolic/diastolic HYQ:MVHQ 2D echo showed EF of 45 to 50%, no wall motion abnormality, grade 1 diastolic dysfunction.  Currently appears euvolemic.  Continue current medications   Hypothyroidism: Continue Synthyroid   Severe aortic stenosis: Status post TAVR on 1/23   Abnormal ABI: ABI done here showed b/l noncompressible lower extremity arteries. B/L abnormal  toe-brachial index.  Consulting vascular surgery   Vitamin B-12 deficiency: Takes supplementation at home     Nutrition Problem: Increased nutrient needs Etiology:  wound healing   DVT prophylaxis:apixaban (ELIQUIS) tablet 2.5 mg Start: 05/06/23 1200 apixaban (ELIQUIS) tablet 2.5 mg       Code Status: Full Code   Family Communication: Daughter at bedside   Patient status:Inpatient   Patient is from :Home   Anticipated discharge ZO:XWRU   Estimated DC date:after ID clearance     Consultants: Orthopedics,ID   Procedures:Left 4th  toe amputation   Antimicrobials:  Anti-infectives(From admission, onward)        Subjective: The patient is sitting up in chair. He wants to get back in bed, and is complaining bitterly of bilateral knee and leg pain with sitting up.   Physical Exam: Vitals:   05/07/23 1202 05/07/23 1930 05/08/23 0415 05/08/23 1159  BP: 105/82 132/61 129/71 133/60  Pulse: 87 79 70 70  Resp: 18 19 18 18   Temp: (!) 97.5 F (36.4 C) 98.3 F (36.8 C) 98.1 F (36.7 C) 97.9 F (36.6 C)  TempSrc: Oral Oral Oral Oral  SpO2: 100% 94% 97% 96%  Weight:      Height:       Exam:  Constitutional:  The patient is awake, alert, and oriented x 3. He is in moderate distress due to pain with sitting up in legs and knees bilaterally. Respiratory:  No increased work of breathing. No wheezes, rales, or rhonchi No tactile fremitus Cardiovascular:  Regular rate and rhythm No murmurs, ectopy, or gallups. No lateral PMI. No thrills. Abdomen:  Abdomen is soft, non-tender, non-distended No hernias, masses, or organomegaly Normoactive bowel sounds.  Musculoskeletal:  No cyanosis, clubbing, or edema Left lower extremity/foot is bandaged. Skin:  No rashes, lesions, ulcers palpation of skin: no induration or nodules Neurologic:  CN 2-12 intact Sensation all 4 extremities intact Psychiatric:  Mental status Mood, affect appropriate Orientation to person, place, time  judgment and insight appear intact  Data Reviewed:  CBC BMP Uric acid  Family Communication: Daughter is at bedside.  Disposition: Status is: Inpatient Remains inpatient appropriate because: Pt with osteomyelitis without definitive cultures or antibiotic plan in place. Patient will  undoubtedly require rehab.  Planned Discharge Destination: Rehab    Time spent: 34 minutes  Author: Junita Oliva, DO 05/08/2023 6:48 PM  For on call review [email or web address].

## 2023-05-08 NOTE — Progress Notes (Signed)
 IV team called and notified me the central line is removed by IR since it is tunneled.  To notify MD at this time

## 2023-05-09 ENCOUNTER — Telehealth (HOSPITAL_COMMUNITY): Payer: Self-pay | Admitting: Pharmacy Technician

## 2023-05-09 ENCOUNTER — Other Ambulatory Visit (HOSPITAL_COMMUNITY): Payer: Self-pay

## 2023-05-09 ENCOUNTER — Inpatient Hospital Stay (HOSPITAL_COMMUNITY)

## 2023-05-09 DIAGNOSIS — M869 Osteomyelitis, unspecified: Secondary | ICD-10-CM | POA: Diagnosis not present

## 2023-05-09 HISTORY — PX: IR REMOVAL TUN CV CATH W/O FL: IMG2289

## 2023-05-09 LAB — BASIC METABOLIC PANEL WITH GFR
Anion gap: 10 (ref 5–15)
BUN: 97 mg/dL — ABNORMAL HIGH (ref 8–23)
CO2: 17 mmol/L — ABNORMAL LOW (ref 22–32)
Calcium: 9.2 mg/dL (ref 8.9–10.3)
Chloride: 107 mmol/L (ref 98–111)
Creatinine, Ser: 2.48 mg/dL — ABNORMAL HIGH (ref 0.61–1.24)
GFR, Estimated: 25 mL/min — ABNORMAL LOW (ref >60)
Glucose, Bld: 182 mg/dL — ABNORMAL HIGH (ref 70–99)
Potassium: 5 mmol/L (ref 3.5–5.1)
Sodium: 134 mmol/L — ABNORMAL LOW (ref 135–145)

## 2023-05-09 LAB — AEROBIC/ANAEROBIC CULTURE W GRAM STAIN (SURGICAL/DEEP WOUND)
Culture: NO GROWTH
Culture: NO GROWTH
Gram Stain: NONE SEEN
Gram Stain: NONE SEEN

## 2023-05-09 NOTE — Telephone Encounter (Signed)
 Pharmacy Patient Advocate Encounter  Insurance verification completed.    The patient is insured through [employer]. Patient has Medicare and is not eligible for a copay card, but may be able to apply for patient assistance or Medicare RX Payment Plan (Patient Must reach out to their plan, if eligible for payment plan), if available.    Ran test claim for LINEZOLID 600MG  TABLETS  and the current 8 day co-pay is $0.00.   This test claim was processed through [facility]- copay amounts may vary at other pharmacies due to pharmacy/plan contracts, or as the patient moves through the different stages of their insurance plan.

## 2023-05-09 NOTE — Procedures (Signed)
 Pt's right IJ tunneled CVC was removed in its entirety without immediate complications. Vaseline/gauze dressing applied to site. EBL none.

## 2023-05-09 NOTE — Progress Notes (Signed)
 Physical Therapy Treatment Patient Details Name: Jordan Oconnell MRN: [id number] DOB: 12-15-1940 Today's Date: 05/09/2023   History of Present Illness 83 year old admitted with Left fourth toe extensive osteomyelitis involving proximal and middle phalanges with forefoot cellulitis now s/p left fourth toe disarticulation at metatarsophalangeal joint on 4/9.  Past medical history significant for previously hospitalized in 02/2023 for R 4th toe amputation, PAF on Eliquis, aortic stenosis status post TAVR, tachybradycardia syndrome, status post pacemaker, CKD stage IV, urothelial cancer status post multiple TURP with urostomy in place, RCC status post left nephrectomy with recurrent urothelial disease to solitary right renal pelvis, hypothyroidism    PT Comments  PT - Cognition Comments: AxO x 3 Retired Teacher/Educator/Coach Assisted OOB was very difficult.  General bed mobility comments: Pt required Max Assist + 2 esp to complete scooting to EOB using bed pad to complete. General transfer comment: Required + 2 side by side assist and walker to transfer from elevated bed to [facility] then from [facility] to recliner allm while educating on WBing "through heel" L LE.  Extended time sitting on BSC with no results.  Pt was hoping to have a BM.  Assisted from [facility] to recliner + 2 assist.  Pt was unable to functionally amb this session due to weakness and difficulty maintaining "heel only" WBing thru l foot.   Pt lives home alone and was Indep.  Pt will need ST Rehab at SNF to address mobility and functional decline prior to safely returning home.    If plan is discharge home, recommend the following: Assistance with cooking/housework;Assist for transportation;Two people to help with walking and/or transfers;A lot of help with bathing/dressing/bathroom   Can travel by private vehicle     No  Equipment Recommendations  Rolling walker (2 wheels)    Recommendations for Other Services       Precautions /  Restrictions Precautions Precautions: Fall Recall of Precautions/Restrictions: Impaired Precaution/Restrictions Comments: Lt darco shoe Restrictions Weight Bearing Restrictions Per Provider Order: Yes Other Position/Activity Restrictions: "strict heel WB operative extremity" per ortho note     Mobility  Bed Mobility Overal bed mobility: Needs Assistance Bed Mobility: Supine to Sit     Supine to sit: Max assist, +2 for safety/equipment     General bed mobility comments: Pt required Max Assist + 2 esp to complete scooting to EOB using bed pad to complete.    Transfers Overall transfer level: Needs assistance Equipment used: Rolling walker (2 wheels) Transfers: Sit to/from Stand, Bed to chair/wheelchair/BSC Sit to Stand: Max assist, +2 physical assistance, +2 safety/equipment, From elevated surface Stand pivot transfers: Max assist, Total assist, +2 physical assistance, +2 safety/equipment         General transfer comment: Required + 2 side by side assist and walker to transfer from elevated bed to [facility] then from [facility] to recliner allm while educating on WBing "through heel" L LE.    Ambulation/Gait               General Gait Details: unable to attempt amb due to poor transfer ability   Stairs             Wheelchair Mobility     Tilt Bed    Modified Rankin (Stroke Patients Only)       Balance  Communication    Cognition   Behavior During Therapy: WFL for tasks assessed/performed   PT - Cognitive impairments: No apparent impairments                       PT - Cognition Comments: AxO x 3 Retired Journalist, newspaper Following commands: Research scientist (physical sciences): Verbal cues  Exercises      General Comments        Pertinent Vitals/Pain Pain Assessment Pain Assessment: Faces Faces Pain Scale: Hurts a little bit Pain Location: bil knees but right worse then  left Pain Descriptors / Indicators: Tender, Sore, Grimacing, Guarding Pain Intervention(s): Repositioned, Monitored during session    Home Living                          Prior Function            PT Goals (current goals can now be found in the care plan section) Progress towards PT goals: Progressing toward goals    Frequency           PT Plan      Co-evaluation              AM-PAC PT "6 Clicks" Mobility   Outcome Measure  Help needed turning from your back to your side while in a flat bed without using bedrails?: A Lot Help needed moving from lying on your back to sitting on the side of a flat bed without using bedrails?: A Lot Help needed moving to and from a bed to a chair (including a wheelchair)?: A Lot Help needed standing up from a chair using your arms (e.g., wheelchair or bedside chair)?: Total Help needed to walk in hospital room?: Total Help needed climbing 3-5 steps with a railing? : Total 6 Click Score: 9    End of Session Equipment Utilized During Treatment: Gait belt Activity Tolerance: Patient limited by pain;Patient limited by fatigue Patient left: in chair;with call bell/phone within reach Nurse Communication: Mobility status PT Visit Diagnosis: Difficulty in walking, not elsewhere classified (R26.2)     Time: 1610-9604 PT Time Calculation (min) (ACUTE ONLY): 38 min  Charges:    $Therapeutic Activity: 38-52 mins PT General Charges $$ ACUTE PT VISIT: 1 Visit                    Bess Broody  PTA Acute  Rehabilitation Services Office M-F          [phone]

## 2023-05-09 NOTE — Plan of Care (Signed)

## 2023-05-09 NOTE — TOC Progression Note (Signed)
 Transition of Care [facility]) - Progression Note    Patient Details  Name: YEHUDA PRINTUP MRN: [id number] Date of Birth: 1940-04-03  [facility] of [facility]) CM/SW Contact  Kathryn Parish, RN Phone Number: 05/09/2023, 1:10 PM  Clinical Narrative:    Patient gave permission to send information for SNF request.Patient states he prefers [facility] since a previous stay.  Insurance Auth in process and hope to be able to admit today. 4:11 Auth still pending  Expected Discharge Plan: Home/Self Care Barriers to Discharge: Continued Medical Work up  Expected Discharge Plan and Services In-house Referral: NA     Living arrangements for the past 2 months: Single Family Home                 DME Arranged: N/A DME Agency: NA       HH Arranged: NA HH Agency: NA         Social Determinants of Health (SDOH) Interventions SDOH Screenings   Food Insecurity: No Food Insecurity (05/03/2023)  Housing: Low Risk  (05/03/2023)  Transportation Needs: No Transportation Needs (05/03/2023)  Utilities: Not At Risk (05/03/2023)  Social Connections: Unknown (05/03/2023)  Recent Concern: Social Connections - Moderately Isolated (03/12/2023)  Tobacco Use: Low Risk  (05/04/2023)    Readmission Risk Interventions    05/04/2023   10:12 AM 03/08/2023   12:36 PM  Readmission Risk Prevention Plan  Transportation Screening Complete Complete  PCP or Specialist Appt within 3-5 Days Complete Complete  HRI or Home Care Consult Complete Complete  Social Work Consult for Recovery Care Planning/Counseling Complete Complete  Palliative Care Screening Not Applicable Not Applicable  Medication Review Oceanographer) Complete Complete

## 2023-05-09 NOTE — Progress Notes (Signed)
 Progress Note   Patient: Jordan Oconnell ZOX:[id number] DOB: [birth year] DOA: 05/03/2023     6 DOS: the patient was seen and examined on 05/09/2023   Brief hospital course: Patient is a 83 year old male with history of bladder cancer, stage IV CKD, BPH, hypertension, hyperlipidemia, A-fib, status post pacemaker, status post TAVR who presented for direct admission from orthopedics for evaluation of left foot osteomyelitis.  MRI confirmed osteomyelitis of 4th left which with done as outpatient.  He was scheduled for direct admission for amputation due to concerns of TAVR, pacemaker.  Orthopedics consulted, status post left fourth toe disarticulation at metatarsophalangeal joint on 4/9.ID also following.  Pending wound culture and determination of optimal antibiotic choice, route, and duration by ID.  The patient's mobility has been significantly hindered by what appears to be osteoarthritis. He has seen some improvement with mobility exercises recommended by nursing that he performs in the bed to reduce stiffness in his joints. He will require rehab at discharge.  Assessment and Plan: Principal Problem:   Osteomyelitis of fourth toe of left foot (HCC) Active Problems:   Urothelial cancer (HCC)   PAF (paroxysmal atrial fibrillation) (HCC)   CKD (chronic kidney disease) stage 4, GFR 15-29 ml/min (HCC)   S/P TAVR (transcatheter aortic valve replacement)   Pacemaker   Chronic combined systolic and diastolic CHF (congestive heart failure) (HCC)   Hypothyroidism   Primary hypertension     Left fourth toe osteomyelitis: MRI findings were concerning for osteomyelitis.  Direct admission as per orthopedics.   ID also consulted and following.  Orthopedics consulted, status post left fourth toe disarticulation at metatarsophalangeal joint on 4/9.ID also following. On daptomycin, ceftriaxone.  Wound culture report does not show any organism or WBC. Also has history of right fourth toe amputation on  03/02/2023, finished antibiotics course with ceftriaxone, daptomycin.  Discussed with ID today.  ID wants to wait until Sunday if the culture grows anything.  Decision will be made on Sunday either to send him home with oral or iv antibiotics PT consulted, recommended home health. ID has decided to continue IV antibiotics for now. When antibiotics are converted to oral will arrange for removal of tunneled catheter. His antibiotic therapy has been changed to oral linezolid and cefadroxil on 05/09/2023 with the end of therapy scheduled on 05/17/2023. He will need to follow up with ID as outpatient on 05/16/2023.    History of coronary artery disease: Currently denies any anginal symptoms.  Last hospitalization, troponin was elevated to 400s.  He declined cardiac cath and opted to manage medically.  Last 2D echo showed EF of 45 to 50%, no wall motion abnormality, grade 1 diastolic dysfunction.  Continue aspirin, statin, Imdur   History of urothelial cancer: Status post urostomy.  Status post left nephrectomy with recurrent disease in the solitary right kidney.  Follows with [facility], oncology and nephrology.  Continue urostomy care.  Next follow-up at [facility] EKG scheduled for 4/17.  Was taking pembrolizumab.  There is some concern for bleeding around the ostomy site.  Wound care  ostomy consulted.   Paroxysmal A-fib/A-flutter/tachybradycardia syndrome: Status post pacemaker.  Continue carvedilol, amiodarone for rate control.  On Eliquis at home ,will resume   Hypertension: On amlodipine, hydralazine.  Currently blood pressure stable   Chronic combined systolic/diastolic WJX:BJYN 2D echo showed EF of 45 to 50%, no wall motion abnormality, grade 1 diastolic dysfunction.  Currently appears euvolemic.  Continue current medications   Hypothyroidism: Continue Synthyroid   Severe aortic stenosis:  Status post TAVR on 1/23   Abnormal ABI: ABI done here showed b/l noncompressible lower extremity arteries. B/L  abnormal  toe-brachial index.  Consulting vascular surgery   Vitamin B-12 deficiency: Takes supplementation at home     Nutrition Problem: Increased nutrient needs Etiology: wound healing   DVT prophylaxis:apixaban (ELIQUIS) tablet 2.5 mg Start: 05/06/23 1200 apixaban (ELIQUIS) tablet 2.5 mg      Code Status: Full Code   Family Communication: Daughter at bedside   Patient status:Inpatient   Patient is from :Home   Anticipated discharge YQ:MVHQ   Estimated DC date:after ID clearance     Consultants: Orthopedics,ID   Procedures:Left 4th  toe amputation   Antimicrobials:  Anti-infectives(From admission, onward)        Subjective: The patient islying in bed performing mobility exercises with his legs bilaterally. There is definitely improvement. Physical Exam: Vitals:   05/09/23 0426 05/09/23 0926 05/09/23 1304 05/09/23 1919  BP: 129/69 134/65 97/60 130/62  Pulse: 69 73 69 70  Resp: 18  18 17   Temp: 97.9 F (36.6 C) 97.8 F (36.6 C) 98 F (36.7 C) (!) 97.4 F (36.3 C)  TempSrc: Oral Oral  Oral  SpO2: 96% 96% 97% 95%  Weight:      Height:       Exam:  Constitutional:  The patient is awake, alert, and oriented x 3.No acute distress. Respiratory:  No increased work of breathing. No wheezes, rales, or rhonchi No tactile fremitus Cardiovascular:  Regular rate and rhythm No murmurs, ectopy, or gallups. No lateral PMI. No thrills. Abdomen:  Abdomen is soft, non-tender, non-distended No hernias, masses, or organomegaly Normoactive bowel sounds.  Musculoskeletal:  No cyanosis, clubbing, or edema Left lower extremity/foot is bandaged. Skin:  No rashes, lesions, ulcers palpation of skin: no induration or nodules Neurologic:  CN 2-12 intact Sensation all 4 extremities intact Psychiatric:  Mental status Mood, affect appropriate Orientation to person, place, time  judgment and insight appear intact  Data Reviewed:  CBC BMP Uric acid  Family  Communication: Daughter is at bedside.  Disposition: Status is: Inpatient Remains inpatient appropriate because: Pt with osteomyelitis without definitive cultures or antibiotic plan in place. Patient will undoubtedly require rehab.  Planned Discharge Destination: Rehab    Time spent: 32 minutes  Author: Ralphael Southgate, DO 05/09/2023 8:23 PM  For on call review [email or web address].

## 2023-05-09 NOTE — [place] FL2 (Signed)
 [place] MEDICAID FL2 LEVEL OF CARE FORM     IDENTIFICATION  Patient Name: Jordan Oconnell Birthdate: 1940/05/23 Sex: male Admission Date (Current Location): 05/03/2023  [facility] and [place] Number:  Producer, television/film/video and Address:  [facility],  [address] [place], [place] [address]      Provider Number: [id number]  Attending Physician Name and Address:  Fran Lowes, DO  Relative Name and Phone Number:       Current Level of Care: Hospital Recommended Level of Care: Skilled Nursing Facility Prior Approval Number:    Date Approved/Denied:   PASRR Number: [id number]  Discharge Plan: SNF    Current Diagnoses: Patient Active Problem List   Diagnosis Date Noted   Osteomyelitis of fourth toe of left foot (HCC) 05/03/2023   Osteomyelitis of left foot (HCC) 04/26/2023   Toe pain, left 04/15/2023   Non-ST elevation (NSTEMI) myocardial infarction (HCC) 03/08/2023   Primary hypertension 03/08/2023   Osteomyelitis of fourth toe of right foot (HCC) 03/01/2023   Chronic combined systolic and diastolic CHF (congestive heart failure) (HCC) 03/01/2023   Hypothyroidism 03/01/2023   Acute renal failure superimposed on stage 4 chronic kidney disease (HCC) 03/13/2022   Acute on chronic systolic CHF (congestive heart failure) (HCC) 03/13/2022   Tachycardia-bradycardia syndrome (HCC) 03/11/2022   Atrial fibrillation with rapid ventricular response (HCC) 03/11/2022   Atypical atrial flutter (HCC) 03/03/2022   Pacemaker 02/12/2021   LBBB (left bundle branch block) 01/29/2021   1st degree AV block 01/29/2021   S/P TAVR (transcatheter aortic valve replacement) 01/27/2021   Severe aortic stenosis 10/05/2020   CKD (chronic kidney disease) stage 4, GFR 15-29 ml/min (HCC) 02/01/2017   Pneumonia 07/26/2013   Intractable hiccups 07/22/2013   Anemia 07/22/2013   Protein-calorie malnutrition, severe (HCC) 07/19/2013   Syncope and collapse 07/17/2013   Bladder cancer (HCC)  07/17/2013   Urothelial cancer (HCC) 09/03/2012   Essential hypertension 09/03/2012   PAF (paroxysmal atrial fibrillation) (HCC) 09/03/2012    Orientation RESPIRATION BLADDER Height & Weight     Self, Time, Situation, Place  Normal Urostomy Weight: 85.3 kg Height:  6\' 1"  (185.4 cm)  BEHAVIORAL SYMPTOMS/MOOD NEUROLOGICAL BOWEL NUTRITION STATUS      Incontinent Diet  AMBULATORY STATUS COMMUNICATION OF NEEDS Skin   Extensive Assist Verbally Surgical wounds                       Personal Care Assistance Level of Assistance  Bathing, Dressing Bathing Assistance: Limited assistance   Dressing Assistance: Limited assistance     Functional Limitations Info             SPECIAL CARE FACTORS FREQUENCY  PT (By licensed PT), OT (By licensed OT)     PT Frequency: 5x Weekly OT Frequency: 5x Weekly            Contractures Contractures Info: Not present    Additional Factors Info  Allergies   Allergies Info: Amoxicillin, Doxycycline, Flomax (Tamsulosin Hcl)           Current Medications (05/09/2023):  This is the current hospital active medication list Current Facility-Administered Medications  Medication Dose Route Frequency Provider Last Rate Last Admin   acetaminophen (TYLENOL) tablet 1,000 mg  1,000 mg Oral Q6H Swayze, Ava, DO   1,000 mg at 05/09/23 0900   allopurinol (ZYLOPRIM) tablet 100 mg  100 mg Oral Daily Jonah Blue, MD   100 mg at 05/09/23 0938   ALPRAZolam Prudy Feeler) tablet 0.25 mg  0.25 mg Oral QHS Jonah Blue, MD   0.25 mg at 05/08/23 2139   amiodarone (PACERONE) tablet 200 mg  200 mg Oral Daily Jonah Blue, MD   200 mg at 05/09/23 1610   amLODipine (NORVASC) tablet 10 mg  10 mg Oral Daily Jonah Blue, MD   10 mg at 05/09/23 9604   aspirin EC tablet 81 mg  81 mg Oral Daily Jonah Blue, MD   81 mg at 05/09/23 5409   atorvastatin (LIPITOR) tablet 10 mg  10 mg Oral Daily Jonah Blue, MD   10 mg at 05/09/23 8119   bisacodyl (DULCOLAX)  EC tablet 5 mg  5 mg Oral Daily PRN Jonah Blue, MD   5 mg at 05/09/23 1044   carvedilol (COREG) tablet 12.5 mg  12.5 mg Oral BID Jonah Blue, MD   12.5 mg at 05/09/23 1478   [START ON 05/10/2023] cefadroxil (DURICEF) capsule 500 mg  500 mg Oral BID Odette Fraction, MD       cefTRIAXone (ROCEPHIN) 2 g in sodium chloride 0.9 % 100 mL IVPB  2 g Intravenous Q24H Odette Fraction, MD       Chlorhexidine Gluconate Cloth 2 % PADS 6 each  6 each Topical Daily Jonah Blue, MD   6 each at 05/08/23 0854   docusate sodium (COLACE) capsule 100 mg  100 mg Oral BID Jonah Blue, MD   100 mg at 05/09/23 2956   feeding supplement (ENSURE ENLIVE / ENSURE PLUS) liquid 237 mL  237 mL Oral BID BM Adhikari, Amrit, MD   237 mL at 05/08/23 2015   hydrALAZINE (APRESOLINE) injection 5 mg  5 mg Intravenous Q4H PRN Jonah Blue, MD       hydrALAZINE (APRESOLINE) tablet 25 mg  25 mg Oral BID Jonah Blue, MD   25 mg at 05/09/23 2130   isosorbide mononitrate (IMDUR) 24 hr tablet 30 mg  30 mg Oral Daily Jonah Blue, MD   30 mg at 05/09/23 8657   levothyroxine (SYNTHROID) tablet 75 mcg  75 mcg Oral QAC breakfast Jonah Blue, MD   75 mcg at 05/09/23 0506   [START ON 05/10/2023] linezolid (ZYVOX) tablet 600 mg  600 mg Oral Q12H Odette Fraction, MD       mirtazapine (REMERON) tablet 7.5 mg  7.5 mg Oral Noemi Chapel, MD   7.5 mg at 05/08/23 2138   nutrition supplement (JUVEN) (JUVEN) powder packet 1 packet  1 packet Oral BID WC Burnadette Pop, MD   1 packet at 05/09/23 0937   ondansetron (ZOFRAN) tablet 4 mg  4 mg Oral Q6H PRN Jonah Blue, MD       Or   ondansetron [facility]) injection 4 mg  4 mg Intravenous Q6H PRN Jonah Blue, MD   4 mg at 05/04/23 1433   oxyCODONE (Oxy IR/ROXICODONE) immediate release tablet 5 mg  5 mg Oral Q6H PRN Jonah Blue, MD   5 mg at 05/07/23 0802   polyethylene glycol (MIRALAX / GLYCOLAX) packet 17 g  17 g Oral Daily PRN Jonah Blue, MD   17 g at  05/09/23 1043   sodium bicarbonate tablet 1,300 mg  1,300 mg Oral BID Jonah Blue, MD   1,300 mg at 05/09/23 0937   sodium chloride flush (NS) 0.9 % injection 10-40 mL  10-40 mL Intracatheter Q12H Jonah Blue, MD   10 mL at 05/09/23 0938   sodium chloride flush (NS) 0.9 % injection 10-40 mL  10-40 mL Intracatheter PRN Jonah Blue, MD  vancomycin (VANCOCIN) powder    PRN Ali Ink, MD   1,000 mg at 05/04/23 1455   zinc sulfate (50mg  elemental zinc) capsule 220 mg  220 mg Oral Daily Leona Rake, MD   220 mg at 05/09/23 6962     Discharge Medications: Please see discharge summary for a list of discharge medications.  Relevant Imaging Results:  Relevant Lab Results:   Additional Information SS# [id number]  Kathryn Parish, RN

## 2023-05-10 DIAGNOSIS — M869 Osteomyelitis, unspecified: Secondary | ICD-10-CM | POA: Diagnosis not present

## 2023-05-10 LAB — CBC WITH DIFFERENTIAL/PLATELET
Abs Immature Granulocytes: 0.07 10*3/uL (ref 0.00–0.07)
Basophils Absolute: 0 10*3/uL (ref 0.0–0.1)
Basophils Relative: 1 %
Eosinophils Absolute: 0.1 10*3/uL (ref 0.0–0.5)
Eosinophils Relative: 2 %
HCT: 30.1 % — ABNORMAL LOW (ref 39.0–52.0)
Hemoglobin: 9.1 g/dL — ABNORMAL LOW (ref 13.0–17.0)
Immature Granulocytes: 2 %
Lymphocytes Relative: 21 %
Lymphs Abs: 0.7 10*3/uL (ref 0.7–4.0)
MCH: 30 pg (ref 26.0–34.0)
MCHC: 30.2 g/dL (ref 30.0–36.0)
MCV: 99.3 fL (ref 80.0–100.0)
Monocytes Absolute: 0.5 10*3/uL (ref 0.1–1.0)
Monocytes Relative: 13 %
Neutro Abs: 2.1 10*3/uL (ref 1.7–7.7)
Neutrophils Relative %: 61 %
Platelets: 98 10*3/uL — ABNORMAL LOW (ref 150–400)
RBC: 3.03 MIL/uL — ABNORMAL LOW (ref 4.22–5.81)
RDW: 15 % (ref 11.5–15.5)
WBC: 3.5 10*3/uL — ABNORMAL LOW (ref 4.0–10.5)
nRBC: 0 % (ref 0.0–0.2)

## 2023-05-10 LAB — BASIC METABOLIC PANEL WITH GFR
Anion gap: 9 (ref 5–15)
BUN: 101 mg/dL — ABNORMAL HIGH (ref 8–23)
CO2: 20 mmol/L — ABNORMAL LOW (ref 22–32)
Calcium: 9.4 mg/dL (ref 8.9–10.3)
Chloride: 109 mmol/L (ref 98–111)
Creatinine, Ser: 2.5 mg/dL — ABNORMAL HIGH (ref 0.61–1.24)
GFR, Estimated: 25 mL/min — ABNORMAL LOW (ref >60)
Glucose, Bld: 129 mg/dL — ABNORMAL HIGH (ref 70–99)
Potassium: 4.9 mmol/L (ref 3.5–5.1)
Sodium: 138 mmol/L (ref 135–145)

## 2023-05-10 MED ORDER — APIXABAN 2.5 MG PO TABS
2.5000 mg | ORAL_TABLET | Freq: Two times a day (BID) | ORAL | Status: DC
  Administered 2023-05-10 – 2023-05-12 (×5): 2.5 mg via ORAL
  Filled 2023-05-10 (×5): qty 1

## 2023-05-10 MED ORDER — BISACODYL 10 MG RE SUPP
10.0000 mg | Freq: Once | RECTAL | Status: AC
  Administered 2023-05-10: 10 mg via RECTAL
  Filled 2023-05-10: qty 1

## 2023-05-10 NOTE — Plan of Care (Signed)

## 2023-05-10 NOTE — Progress Notes (Signed)
 PT Cancellation Note  Patient Details Name: GRACIN MCPARTLAND MRN: [id number] DOB: October 05, 1940   Cancelled Treatment:     Pt c/o freq BM's "It has been a day" and requested I come back tomorrow morning.    Bess Broody  PTA Acute  Rehabilitation Services Office M-F          [phone]

## 2023-05-10 NOTE — Progress Notes (Signed)
 Progress Note   Patient: Jordan Oconnell ZOX:[id number] DOB: 03/27/1940 DOA: 05/03/2023     7 DOS: the patient was seen and examined on 05/10/2023   Brief hospital course: Patient is a 83 year old male with history of bladder cancer, stage IV CKD, BPH, hypertension, hyperlipidemia, A-fib, status post pacemaker, status post TAVR who presented for direct admission from orthopedics for evaluation of left foot osteomyelitis.  MRI confirmed osteomyelitis of 4th left which with done as outpatient.  He was scheduled for direct admission for amputation due to concerns of TAVR, pacemaker.  Orthopedics consulted, status post left fourth toe disarticulation at metatarsophalangeal joint on 4/9.ID also following.  Pending wound culture and determination of optimal antibiotic choice, route, and duration by ID.  The patient's mobility has been significantly hindered by what appears to be osteoarthritis. He has seen some improvement with mobility exercises recommended by nursing that he performs in the bed to reduce stiffness in his joints. He will require rehab at discharge.  Infectious disease states that the patient will require linezolid and cefadroxil through 05/17/2023.   On 05/10/2023 the patient is complaining of severe discomfort, because he has not had a BM. [place] ordered.  PT will need to get the patient up today.   Assessment and Plan: Principal Problem:   Osteomyelitis of fourth toe of left foot (HCC) Active Problems:   Urothelial cancer (HCC)   PAF (paroxysmal atrial fibrillation) (HCC)   CKD (chronic kidney disease) stage 4, GFR 15-29 ml/min (HCC)   S/P TAVR (transcatheter aortic valve replacement)   Pacemaker   Chronic combined systolic and diastolic CHF (congestive heart failure) (HCC)   Hypothyroidism   Primary hypertension     Left fourth toe osteomyelitis: MRI findings were concerning for osteomyelitis.  Direct admission as per orthopedics.   ID also consulted and following.   Orthopedics consulted, status post left fourth toe disarticulation at metatarsophalangeal joint on 4/9.ID also following. On daptomycin, ceftriaxone.  Wound culture report does not show any organism or WBC. Also has history of right fourth toe amputation on 03/02/2023, finished antibiotics course with ceftriaxone, daptomycin.  Discussed with ID today.  ID wants to wait until Sunday if the culture grows anything.  Decision will be made on Sunday either to send him home with oral or iv antibiotics PT consulted, recommended home health. ID has decided to continue IV antibiotics for now. When antibiotics are converted to oral will arrange for removal of tunneled catheter. His antibiotic therapy has been changed to oral linezolid and cefadroxil on 05/09/2023 with the end of therapy scheduled on 05/17/2023. He will need to follow up with ID as outpatient on 05/16/2023. Infectious disease states that the patient will require linezolid and cefadroxil through 05/17/2023.    History of coronary artery disease: Currently denies any anginal symptoms.  Last hospitalization, troponin was elevated to 400s.  He declined cardiac cath and opted to manage medically.  Last 2D echo showed EF of 45 to 50%, no wall motion abnormality, grade 1 diastolic dysfunction.  Continue aspirin, statin, Imdur   History of urothelial cancer: Status post urostomy.  Status post left nephrectomy with recurrent disease in the solitary right kidney.  Follows with [facility], oncology and nephrology.  Continue urostomy care.  Next follow-up at [facility] E[facility] scheduled for 4/17.  Was taking pembrolizumab.  There is some concern for bleeding around the ostomy site.  Wound care  ostomy consulted.   Paroxysmal A-fib/A-flutter/tachybradycardia syndrome: Status post pacemaker.  Continue carvedilol, amiodarone for rate control.  On Eliquis at home ,will resume   Hypertension: On amlodipine, hydralazine.  Currently blood pressure stable  Constipated: Enema  ordered.   Chronic combined systolic/diastolic UEA:VWUJ 2D echo showed EF of 45 to 50%, no wall motion abnormality, grade 1 diastolic dysfunction.  Currently appears euvolemic.  Continue current medications   Hypothyroidism: Continue Synthyroid   Severe aortic stenosis: Status post TAVR on 1/23   Abnormal ABI: ABI done here showed b/l noncompressible lower extremity arteries. B/L abnormal  toe-brachial index.  Consulting vascular surgery   Vitamin B-12 deficiency: Takes supplementation at home     Nutrition Problem: Increased nutrient needs Etiology: wound healing   DVT prophylaxis:apixaban (ELIQUIS) tablet 2.5 mg Start: 05/06/23 1200 apixaban (ELIQUIS) tablet 2.5 mg      Code Status: Full Code   Family Communication: Daughter at bedside   Patient status:Inpatient   Patient is from :Home   Anticipated discharge WJ:XBJY   Estimated DC date:after ID clearance     Consultants: Orthopedics,ID   Procedures:Left 4th  toe amputation   Antimicrobials:  Anti-infectives(From admission, onward)        Subjective: The patient islying in bed performing mobility exercises with his legs bilaterally. There is definitely improvement. Physical Exam: Vitals:   05/10/23 0451 05/10/23 1016 05/10/23 1233 05/10/23 1629  BP: 126/61 130/62 136/67 120/60  Pulse: 66 69 71 70  Resp: 18  16   Temp: 98.1 F (36.7 C)  97.9 F (36.6 C) (!) 97.5 F (36.4 C)  TempSrc: Axillary   Oral  SpO2: 98% 94% 95% 96%  Weight:      Height:       Exam:  Constitutional:  The patient is awake, alert, and oriented x 3.No acute distress. Respiratory:  No increased work of breathing. No wheezes, rales, or rhonchi No tactile fremitus Cardiovascular:  Regular rate and rhythm No murmurs, ectopy, or gallups. No lateral PMI. No thrills. Abdomen:  Abdomen is soft, non-tender, non-distended No hernias, masses, or organomegaly Normoactive bowel sounds.  Musculoskeletal:  No cyanosis, clubbing, or  edema Left lower extremity/foot is bandaged. Skin:  No rashes, lesions, ulcers palpation of skin: no induration or nodules Neurologic:  CN 2-12 intact Sensation all 4 extremities intact Psychiatric:  Mental status Mood, affect appropriate Orientation to person, place, time  judgment and insight appear intact  Data Reviewed:  CBC BMP Uric acid  Family Communication: Daughter is at bedside.  Disposition: Status is: Inpatient Remains inpatient appropriate because: Pt with osteomyelitis without definitive cultures or antibiotic plan in place. Patient will undoubtedly require rehab.  Planned Discharge Destination: Rehab    Time spent: 32 minutes  Author: Junita Oliva, DO 05/10/2023 6:27 PM  For on call review [email or web address].

## 2023-05-10 NOTE — TOC Progression Note (Addendum)
 Transition of Care [facility]) - Progression Note    Patient Details  Name: Jordan Oconnell MRN: [id number] Date of Birth: Dec 18, 1940  [facility]) CM/SW Contact  Kathryn Parish, RN Phone Number: 05/10/2023, 8:25 AM  Clinical Narrative:    1:47 PM Navi requested additional PT note, IV antibiotic stop and wound documentation. Sent. 9:10 AM  Received call from [place] with [facility] stating Olga Berthold is requesting IV antibiotic stop date and wound care notes. Secured chat Dr. Rosaline Coma for IV antibiotic stop date. 8:25 AM Still waiting on auth. Called Navi to confirm. Ref # [id number]   Expected Discharge Plan: Home/Self Care Barriers to Discharge: Continued Medical Work up  Expected Discharge Plan and Services In-house Referral: NA     Living arrangements for the past 2 months: Single Family Home                 DME Arranged: N/A DME Agency: NA       HH Arranged: NA HH Agency: NA         Social Determinants of Health (SDOH) Interventions SDOH Screenings   Food Insecurity: No Food Insecurity (05/03/2023)  Housing: Low Risk  (05/03/2023)  Transportation Needs: No Transportation Needs (05/03/2023)  Utilities: Not At Risk (05/03/2023)  Social Connections: Unknown (05/03/2023)  Recent Concern: Social Connections - Moderately Isolated (03/12/2023)  Tobacco Use: Low Risk  (05/04/2023)    Readmission Risk Interventions    05/04/2023   10:12 AM 03/08/2023   12:36 PM  Readmission Risk Prevention Plan  Transportation Screening Complete Complete  PCP or Specialist Appt within 3-5 Days Complete Complete  HRI or Home Care Consult Complete Complete  Social Work Consult for Recovery Care Planning/Counseling Complete Complete  Palliative Care Screening Not Applicable Not Applicable  Medication Review Oceanographer) Complete Complete

## 2023-05-11 DIAGNOSIS — M869 Osteomyelitis, unspecified: Secondary | ICD-10-CM | POA: Diagnosis not present

## 2023-05-11 LAB — CBC WITH DIFFERENTIAL/PLATELET
Abs Immature Granulocytes: 0 10*3/uL (ref 0.00–0.07)
Basophils Absolute: 0 10*3/uL (ref 0.0–0.1)
Basophils Relative: 0 %
Eosinophils Absolute: 0 10*3/uL (ref 0.0–0.5)
Eosinophils Relative: 0 %
HCT: 31.5 % — ABNORMAL LOW (ref 39.0–52.0)
Hemoglobin: 9.6 g/dL — ABNORMAL LOW (ref 13.0–17.0)
Lymphocytes Relative: 13 %
Lymphs Abs: 0.6 10*3/uL — ABNORMAL LOW (ref 0.7–4.0)
MCH: 30.8 pg (ref 26.0–34.0)
MCHC: 30.5 g/dL (ref 30.0–36.0)
MCV: 101 fL — ABNORMAL HIGH (ref 80.0–100.0)
Monocytes Absolute: 0.2 10*3/uL (ref 0.1–1.0)
Monocytes Relative: 5 %
Neutro Abs: 3.5 10*3/uL (ref 1.7–7.7)
Neutrophils Relative %: 82 %
Platelets: 126 10*3/uL — ABNORMAL LOW (ref 150–400)
RBC: 3.12 MIL/uL — ABNORMAL LOW (ref 4.22–5.81)
RDW: 15.1 % (ref 11.5–15.5)
WBC: 4.3 10*3/uL (ref 4.0–10.5)
nRBC: 0 % (ref 0.0–0.2)

## 2023-05-11 LAB — BASIC METABOLIC PANEL WITH GFR
Anion gap: 9 (ref 5–15)
BUN: 102 mg/dL — ABNORMAL HIGH (ref 8–23)
CO2: 18 mmol/L — ABNORMAL LOW (ref 22–32)
Calcium: 9.4 mg/dL (ref 8.9–10.3)
Chloride: 109 mmol/L (ref 98–111)
Creatinine, Ser: 2.36 mg/dL — ABNORMAL HIGH (ref 0.61–1.24)
GFR, Estimated: 26 mL/min — ABNORMAL LOW (ref >60)
Glucose, Bld: 123 mg/dL — ABNORMAL HIGH (ref 70–99)
Potassium: 4.5 mmol/L (ref 3.5–5.1)
Sodium: 136 mmol/L (ref 135–145)

## 2023-05-11 MED ORDER — ENSURE ENLIVE PO LIQD: 237.0000 mL | Freq: Two times a day (BID) | ORAL | 12 refills | Status: AC

## 2023-05-11 MED ORDER — CEFADROXIL 500 MG PO CAPS: 500.0000 mg | ORAL_CAPSULE | Freq: Two times a day (BID) | ORAL | 0 refills | Status: DC

## 2023-05-11 MED ORDER — JUVEN PO PACK: 1.0000 | PACK | Freq: Two times a day (BID) | ORAL | 0 refills | Status: DC

## 2023-05-11 MED ORDER — LINEZOLID 600 MG PO TABS: 600.0000 mg | ORAL_TABLET | Freq: Two times a day (BID) | ORAL | 0 refills | Status: DC

## 2023-05-11 MED ORDER — DOCUSATE SODIUM 100 MG PO CAPS: 100.0000 mg | ORAL_CAPSULE | Freq: Every day | ORAL | 0 refills | Status: DC

## 2023-05-11 MED ORDER — BISACODYL 5 MG PO TBEC: 5.0000 mg | DELAYED_RELEASE_TABLET | Freq: Every day | ORAL | 0 refills | Status: DC

## 2023-05-11 NOTE — TOC Progression Note (Addendum)
 Transition of Care [facility]) - Progression Note    Patient Details  Name: Jordan Oconnell MRN: [id number] Date of Birth: October 28, 1940  [facility]) CM/SW Contact  Kathryn Parish, RN Phone Number: 05/11/2023, 9:08 AM  Clinical Narrative:    Siegfried Dress approved   Informed [facility] notified of approval.  4:56 PM NCM called Rhoda at [facility] and  will not have bed for discharge today. Rhoda states will have bed tomorrow.  Expected Discharge Plan: Home/Self Care Barriers to Discharge: Continued Medical Work up  Expected Discharge Plan and Services In-house Referral: NA     Living arrangements for the past 2 months: Single Family Home                 DME Arranged: N/A DME Agency: NA       HH Arranged: NA HH Agency: NA         Social Determinants of Health (SDOH) Interventions SDOH Screenings   Food Insecurity: No Food Insecurity (05/03/2023)  Housing: Low Risk  (05/03/2023)  Transportation Needs: No Transportation Needs (05/03/2023)  Utilities: Not At Risk (05/03/2023)  Social Connections: Unknown (05/03/2023)  Recent Concern: Social Connections - Moderately Isolated (03/12/2023)  Tobacco Use: Low Risk  (05/04/2023)    Readmission Risk Interventions    05/04/2023   10:12 AM 03/08/2023   12:36 PM  Readmission Risk Prevention Plan  Transportation Screening Complete Complete  PCP or Specialist Appt within 3-5 Days Complete Complete  HRI or Home Care Consult Complete Complete  Social Work Consult for Recovery Care Planning/Counseling Complete Complete  Palliative Care Screening Not Applicable Not Applicable  Medication Review Oceanographer) Complete Complete

## 2023-05-11 NOTE — Plan of Care (Signed)

## 2023-05-11 NOTE — [place] FL2 (Signed)
 [place] MEDICAID FL2 LEVEL OF CARE FORM     IDENTIFICATION  Patient Name: Jordan Oconnell Birthdate: Jul 28, 1940 Sex: male Admission Date (Current Location): 05/03/2023  [facility] and [place] Number:  Producer, television/film/video and Address:  [facility],  [address] [place], [place] [address]      Provider Number: [id number]  Attending Physician Name and Address:  Fran Lowes, DO  Relative Name and Phone Number:       Current Level of Care: Hospital Recommended Level of Care: Skilled Nursing Facility Prior Approval Number:    Date Approved/Denied:   PASRR Number: [id number]  Discharge Plan: SNF    Current Diagnoses: Patient Active Problem List   Diagnosis Date Noted   Osteomyelitis of fourth toe of left foot (HCC) 05/03/2023   Osteomyelitis of left foot (HCC) 04/26/2023   Toe pain, left 04/15/2023   Non-ST elevation (NSTEMI) myocardial infarction (HCC) 03/08/2023   Primary hypertension 03/08/2023   Osteomyelitis of fourth toe of right foot (HCC) 03/01/2023   Chronic combined systolic and diastolic CHF (congestive heart failure) (HCC) 03/01/2023   Hypothyroidism 03/01/2023   Acute renal failure superimposed on stage 4 chronic kidney disease (HCC) 03/13/2022   Acute on chronic systolic CHF (congestive heart failure) (HCC) 03/13/2022   Tachycardia-bradycardia syndrome (HCC) 03/11/2022   Atrial fibrillation with rapid ventricular response (HCC) 03/11/2022   Atypical atrial flutter (HCC) 03/03/2022   Pacemaker 02/12/2021   LBBB (left bundle branch block) 01/29/2021   1st degree AV block 01/29/2021   S/P TAVR (transcatheter aortic valve replacement) 01/27/2021   Severe aortic stenosis 10/05/2020   CKD (chronic kidney disease) stage 4, GFR 15-29 ml/min (HCC) 02/01/2017   Pneumonia 07/26/2013   Intractable hiccups 07/22/2013   Anemia 07/22/2013   Protein-calorie malnutrition, severe (HCC) 07/19/2013   Syncope and collapse 07/17/2013   Bladder cancer (HCC)  07/17/2013   Urothelial cancer (HCC) 09/03/2012   Essential hypertension 09/03/2012   PAF (paroxysmal atrial fibrillation) (HCC) 09/03/2012    Orientation RESPIRATION BLADDER Height & Weight     Self, Time, Situation, Place  Normal Urostomy Weight: 85.3 kg Height:  6\' 1"  (185.4 cm)  BEHAVIORAL SYMPTOMS/MOOD NEUROLOGICAL BOWEL NUTRITION STATUS      Incontinent Diet  AMBULATORY STATUS COMMUNICATION OF NEEDS Skin   Extensive Assist Verbally Surgical wounds                       Personal Care Assistance Level of Assistance  Bathing, Dressing Bathing Assistance: Limited assistance   Dressing Assistance: Limited assistance     Functional Limitations Info             SPECIAL CARE FACTORS FREQUENCY  PT (By licensed PT), OT (By licensed OT)     PT Frequency: 5x Weekly OT Frequency: 5x Weekly            Contractures Contractures Info: Not present    Additional Factors Info  Allergies   Allergies Info: Amoxicillin, Doxycycline, Flomax (Tamsulosin Hcl)           Current Medications (05/11/2023):  This is the current hospital active medication list Current Facility-Administered Medications  Medication Dose Route Frequency Provider Last Rate Last Admin   acetaminophen (TYLENOL) tablet 1,000 mg  1,000 mg Oral Q6H Swayze, Ava, DO   1,000 mg at 05/11/23 0909   allopurinol (ZYLOPRIM) tablet 100 mg  100 mg Oral Daily Jonah Blue, MD   100 mg at 05/11/23 0910   ALPRAZolam (XANAX) tablet 0.25 mg  0.25 mg Oral QHS Jonah Blue, MD   0.25 mg at 05/10/23 2122   amiodarone (PACERONE) tablet 200 mg  200 mg Oral Daily Jonah Blue, MD   200 mg at 05/11/23 0911   amLODipine (NORVASC) tablet 10 mg  10 mg Oral Daily Jonah Blue, MD   10 mg at 05/11/23 4098   apixaban (ELIQUIS) tablet 2.5 mg  2.5 mg Oral BID Swayze, Ava, DO   2.5 mg at 05/11/23 1191   aspirin EC tablet 81 mg  81 mg Oral Daily Jonah Blue, MD   81 mg at 05/11/23 0910   atorvastatin (LIPITOR) tablet  10 mg  10 mg Oral Daily Jonah Blue, MD   10 mg at 05/11/23 4782   bisacodyl (DULCOLAX) EC tablet 5 mg  5 mg Oral Daily PRN Jonah Blue, MD   5 mg at 05/10/23 1006   carvedilol (COREG) tablet 12.5 mg  12.5 mg Oral BID Jonah Blue, MD   12.5 mg at 05/11/23 9562   cefadroxil (DURICEF) capsule 500 mg  500 mg Oral BID Odette Fraction, MD   500 mg at 05/11/23 0911   docusate sodium (COLACE) capsule 100 mg  100 mg Oral BID Jonah Blue, MD   100 mg at 05/11/23 0911   feeding supplement (ENSURE ENLIVE / ENSURE PLUS) liquid 237 mL  237 mL Oral BID BM Burnadette Pop, MD   237 mL at 05/10/23 1927   hydrALAZINE (APRESOLINE) injection 5 mg  5 mg Intravenous Q4H PRN Jonah Blue, MD       hydrALAZINE (APRESOLINE) tablet 25 mg  25 mg Oral BID Jonah Blue, MD   25 mg at 05/11/23 0911   isosorbide mononitrate (IMDUR) 24 hr tablet 30 mg  30 mg Oral Daily Jonah Blue, MD   30 mg at 05/11/23 1308   levothyroxine (SYNTHROID) tablet 75 mcg  75 mcg Oral QAC breakfast Jonah Blue, MD   75 mcg at 05/11/23 6578   linezolid (ZYVOX) tablet 600 mg  600 mg Oral Q12H Odette Fraction, MD   600 mg at 05/11/23 0911   mirtazapine (REMERON) tablet 7.5 mg  7.5 mg Oral Noemi Chapel, MD   7.5 mg at 05/10/23 2122   nutrition supplement (JUVEN) (JUVEN) powder packet 1 packet  1 packet Oral BID WC Burnadette Pop, MD   1 packet at 05/11/23 0911   ondansetron (ZOFRAN) tablet 4 mg  4 mg Oral Q6H PRN Jonah Blue, MD       Or   ondansetron [facility]) injection 4 mg  4 mg Intravenous Q6H PRN Jonah Blue, MD   4 mg at 05/04/23 1433   oxyCODONE (Oxy IR/ROXICODONE) immediate release tablet 5 mg  5 mg Oral Q6H PRN Jonah Blue, MD   5 mg at 05/07/23 0802   polyethylene glycol (MIRALAX / GLYCOLAX) packet 17 g  17 g Oral Daily PRN Jonah Blue, MD   17 g at 05/10/23 0758   sodium bicarbonate tablet 1,300 mg  1,300 mg Oral BID Jonah Blue, MD   1,300 mg at 05/11/23 0910   sodium chloride  flush (NS) 0.9 % injection 10-40 mL  10-40 mL Intracatheter Q12H Jonah Blue, MD   10 mL at 05/11/23 0908   sodium chloride flush (NS) 0.9 % injection 10-40 mL  10-40 mL Intracatheter PRN Jonah Blue, MD       vancomycin Robet Leu) powder    PRN Netta Cedars, MD   1,000 mg at 05/04/23 1455   zinc sulfate (50mg  elemental zinc)  capsule 220 mg  220 mg Oral Daily Leona Rake, MD   220 mg at 05/11/23 0911     Discharge Medications: Pl AKE these medications     acetaminophen 500 MG tablet Commonly known as: TYLENOL Take 1,000 mg by mouth See admin instructions. Take 1,000 mg by mouth in the morning and evening    allopurinol 100 MG tablet Commonly known as: ZYLOPRIM Take 1 tablet (100 mg total) by mouth daily.    ALPRAZolam 0.25 MG tablet Commonly known as: XANAX Take 0.25 mg by mouth at bedtime.    amiodarone 200 MG tablet Commonly known as: PACERONE Take 1 tablet by mouth once daily    amLODipine 10 MG tablet Commonly known as: NORVASC Take 1 tablet (10 mg total) by mouth daily.    aspirin EC 81 MG tablet Take 1 tablet (81 mg total) by mouth daily. Swallow whole.    atorvastatin 10 MG tablet Commonly known as: LIPITOR Take 1 tablet (10 mg total) by mouth daily. What changed: when to take this    bisacodyl 5 MG EC tablet Commonly known as: DULCOLAX Take 1 tablet (5 mg total) by mouth daily. Hold for 24 hours if more than 2 loose bowel movements in one day.    carvedilol 12.5 MG tablet Commonly known as: COREG Take 12.5 mg by mouth 2 (two) times daily.    cefadroxil 500 MG capsule Commonly known as: DURICEF Take 1 capsule (500 mg total) by mouth 2 (two) times daily.    cyanocobalamin 1000 MCG tablet Commonly known as: VITAMIN B12 Take 1 tablet (1,000 mcg total) by mouth daily.    docusate sodium 100 MG capsule Commonly known as: COLACE Take 1 capsule (100 mg total) by mouth daily.    Eliquis 2.5 MG Tabs tablet Generic drug: apixaban Take 1  tablet (2.5 mg total) by mouth 2 (two) times daily.    feeding supplement Liqd Take 237 mLs by mouth 2 (two) times daily between meals.    nutrition supplement (JUVEN) Pack Take 1 packet by mouth 2 (two) times daily with a meal.    hydrALAZINE 25 MG tablet Commonly known as: APRESOLINE Take 1 tablet (25 mg total) by mouth 2 (two) times daily.    isosorbide mononitrate 30 MG 24 hr tablet Commonly known as: IMDUR Take 1 tablet (30 mg total) by mouth daily.    levothyroxine 75 MCG tablet Commonly known as: SYNTHROID Take 75 mcg by mouth daily before breakfast.    linezolid 600 MG tablet Commonly known as: ZYVOX Take 1 tablet (600 mg total) by mouth every 12 (twelve) hours.    mirtazapine 15 MG tablet Commonly known as: REMERON Take 7.5 mg by mouth at bedtime.    oxyCODONE 5 MG immediate release tablet Commonly known as: Oxy IR/ROXICODONE Take 1 tablet (5 mg total) by mouth every 6 (six) hours as needed for moderate pain (pain score 4-6).    ease see discharge summary for a list of discharge medications.  Relevant Imaging Results:  Relevant Lab Results:   Additional Information SS# [id number]  Kathryn Parish, RN

## 2023-05-11 NOTE — Progress Notes (Signed)
 Physical Therapy Treatment Patient Details Name: Jordan Oconnell MRN: [id number] DOB: [birth year] Today's Date: 05/11/2023   History of Present Illness 83 year old admitted with Left fourth toe extensive osteomyelitis involving proximal and middle phalanges with forefoot cellulitis now s/p left fourth toe disarticulation at metatarsophalangeal joint on 4/9.  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  General bed mobility comments: Pt required Mod Max Assist + 2 esp to complete scooting to EOB using bed pad to complete. General transfer comment: Required + 2 side by side assist and walker to transfer from elevated bed to [facility] then from [facility] to recliner allm while educating on WBing "through heel" L LE. General Gait Details: Required + 2 side by side assist and a third to follow with recliner for safety.  Pt nervous.  Does better than he thinks.  Educated on strict WBing thru L heel.  Wearing DARCO shoe.  Unsteady.  Progressing. Pt lives home alone and was indep.  Pt will need ST Rehab at SNF to address mobility and functional decline prior to safely returning home.    If plan is discharge home, recommend the following: Assistance with cooking/housework;Assist for transportation;Two people to help with walking and/or transfers;A lot of help with bathing/dressing/bathroom   Can travel by private vehicle     No  Equipment Recommendations  Rolling walker (2 wheels)    Recommendations for Other Services        Precautions / Restrictions Precautions Precautions: Fall Precaution/Restrictions Comments: Lt darco shoe Restrictions Weight Bearing Restrictions Per Provider Order: Yes Other Position/Activity Restrictions: "strict heel WB operative extremity" per ortho note     Mobility  Bed Mobility Overal bed mobility: Needs Assistance Bed Mobility: Supine to Sit     Supine to sit: Mod assist, Max assist     General bed mobility comments: Pt required Mod Max Assist + 2 esp to complete scooting to EOB using bed pad to complete.    Transfers Overall transfer level: Needs assistance Equipment used: Rolling walker (2 wheels) Transfers: Sit to/from Stand, Bed to chair/wheelchair/BSC   Stand pivot transfers: Max assist, Total assist, +2 physical assistance, +2 safety/equipment         General transfer comment: Required + 2 side by side assist and walker to transfer from elevated bed to [facility] then from [facility] to recliner allm while educating on WBing "through heel" L LE.    Ambulation/Gait Ambulation/Gait assistance: Mod assist, +2 physical assistance, +2 safety/equipment Gait Distance (Feet): 32 Feet Assistive device: Rolling walker (2 wheels) Gait Pattern/deviations: Step-to pattern, Decreased stance time - left Gait velocity: decreased     General Gait Details: Required + 2 side by side assist and a third to follow with recliner for safety.  Pt nervous.  Does better than he thinks.  Educated on strict WBing thru L heel.  Wearing DARCO shoe.  Unsteady.  Progressing.   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 Following commands: Research scientist (physical sciences): Verbal cues  Exercises      General Comments        Pertinent Vitals/Pain Pain Assessment Pain Assessment: Faces Faces Pain Scale: Hurts a little bit Pain Location: bil knees but right worse then left Pain Descriptors / Indicators: Tender, Sore, Grimacing, Guarding Pain Intervention(s): Monitored during session    Home Living                          Prior Function            PT Goals (current goals can now be found in the care plan section) Progress towards PT goals: Progressing toward goals    Frequency    Min 3X/week      PT Plan      Co-evaluation              AM-PAC PT "6 Clicks" Mobility   Outcome Measure  Help needed turning from your back to your side while in a flat bed without using bedrails?: A Lot Help needed moving from lying on your back to sitting on the side of a flat bed without using bedrails?: A Lot Help needed moving to and from a bed to a chair (including a wheelchair)?: A Lot Help needed standing up from a chair using your arms (e.g., wheelchair or bedside chair)?: A Lot Help needed to walk in hospital room?: A Lot Help needed climbing 3-5 steps with a railing? : Total 6 Click Score: 11    End of Session Equipment Utilized During Treatment: Gait belt Activity Tolerance: Patient limited by fatigue Patient left: in chair;with call bell/phone within reach Nurse Communication: Mobility status PT Visit Diagnosis: Difficulty in walking, not elsewhere classified (R26.2)     Time: 1610-9604 PT Time Calculation (min) (ACUTE ONLY): 25 min  Charges:    $Gait Training: 8-22 mins $Therapeutic Activity: 8-22 mins PT General Charges $$ ACUTE PT VISIT: 1 Visit                     {Kery Batzel  PTA Acute  Rehabilitation Services Office M-F          [phone]

## 2023-05-11 NOTE — Care Management Important Message (Signed)
 Important Message  Patient Details IM Letter given. Name: Jordan Oconnell MRN: [id number] Date of Birth: 1940/11/29   Important Message Given:  Yes - Medicare IM     Curtiss Dowdy 05/11/2023, 1:33 PM

## 2023-05-11 NOTE — Discharge Summary (Signed)
 Physician Discharge Summary   Patient: Jordan Oconnell MRN: [id number] DOB: June 26, 1940  Admit date:     05/03/2023  Discharge date: 05/11/23  Discharge Physician: Fran Lowes   PCP: Geoffry Paradise, MD   Recommendations at discharge:    Discharge to SNF for PT/OT. Strict weight bearing on heel only for left foot. Follow up with orthopedic surgery  for wound check. Follow up with orthopedic surgery in 3 weeks for suture removal Follow up with PCP in 7-10 days. Chemistry to be drawn on visit to PCP to be reported to PCP. Follow up with vascular surgery in one month.  Discharge Diagnoses: Principal Problem:   Osteomyelitis of fourth toe of left foot (HCC) Active Problems:   Urothelial cancer (HCC)   PAF (paroxysmal atrial fibrillation) (HCC)   CKD (chronic kidney disease) stage 4, GFR 15-29 ml/min (HCC)   S/P TAVR (transcatheter aortic valve replacement)   Pacemaker   Chronic combined systolic and diastolic CHF (congestive heart failure) (HCC)   Hypothyroidism   Primary hypertension  Resolved Problems:   * No resolved hospital problems. * Brief hospital course: Patient is a 83 year old male with history of bladder cancer, stage IV CKD, BPH, hypertension, hyperlipidemia, A-fib, status post pacemaker, status post TAVR who presented for direct admission from orthopedics for evaluation of left foot osteomyelitis.  MRI confirmed osteomyelitis of 4th left which with done as outpatient.  He was scheduled for direct admission for amputation due to concerns of TAVR, pacemaker.  Orthopedics consulted, status post left fourth toe disarticulation at metatarsophalangeal joint on 4/9.ID also following.  Pending wound culture and determination of optimal antibiotic choice, route, and duration by ID.   The patient's mobility has been significantly hindered by what appears to be osteoarthritis. He has seen some improvement with mobility exercises recommended by nursing that he performs in the bed to  reduce stiffness in his joints. He will require rehab at discharge.   Infectious disease states that the patient will require linezolid and cefadroxil through 05/17/2023.    On 05/10/2023 the patient is complaining of severe discomfort, because he has not had a BM. An enema was order, but his constipation was relieved before it was performed.   The patient will discharge to SNF today after Orthopedic surgery has evaluated the wound.   Assessment and Plan:  Principal Problem:   Osteomyelitis of fourth toe of left foot (HCC) Active Problems:   Urothelial cancer (HCC)   PAF (paroxysmal atrial fibrillation) (HCC)   CKD (chronic kidney disease) stage 4, GFR 15-29 ml/min (HCC)   S/P TAVR (transcatheter aortic valve replacement)   Pacemaker   Chronic combined systolic and diastolic CHF (congestive heart failure) (HCC)   Hypothyroidism   Primary hypertension     Left fourth toe osteomyelitis: MRI findings were concerning for osteomyelitis.  Direct admission as per orthopedics.   ID also consulted and following.  Orthopedics consulted, status post left fourth toe disarticulation at metatarsophalangeal joint on 4/9.ID also following. On daptomycin, ceftriaxone.  Wound culture report does not show any organism or WBC. Also has history of right fourth toe amputation on 03/02/2023, finished antibiotics course with ceftriaxone, daptomycin.  Discussed with ID today.  ID wants to wait until Sunday if the culture grows anything.  Decision will be made on Sunday either to send him home with oral or iv antibiotics PT consulted, recommended home health. ID has decided to continue IV antibiotics for now. When antibiotics are converted to oral will arrange for removal of tunneled  catheter. His antibiotic therapy has been changed to oral linezolid and cefadroxil on 05/09/2023 with the end of therapy scheduled on 05/17/2023. He will need to follow up with ID as outpatient on 05/16/2023. Infectious disease states that  the patient will require linezolid and cefadroxil through 05/17/2023.   On the day of surgery (05/04/2023) [facility] stated that they wanted to evaluate the wound in one week. I have contacted orthopedic surgery to alert them that the patient is discharging today, and they will need to see the patient prior to discharge if they wanted to see him one week after surgery.   History of coronary artery disease: Currently denies any anginal symptoms.  Last hospitalization, troponin was elevated to 400s.  He declined cardiac cath and opted to manage medically.  Last 2D echo showed EF of 45 to 50%, no wall motion abnormality, grade 1 diastolic dysfunction.  Continue aspirin, statin, Imdur   History of urothelial cancer: Status post urostomy.  Status post left nephrectomy with recurrent disease in the solitary right kidney.  Follows with [facility], oncology and nephrology.  Continue urostomy care.  [facility] scheduled for 4/17.  Was taking pembrolizumab.  There is some concern for bleeding around the ostomy site.  Wound care  ostomy consulted.   Paroxysmal A-fib/A-flutter/tachybradycardia syndrome: Status post pacemaker.  Continue carvedilol, amiodarone for rate control.  On Eliquis at home ,will resume   Hypertension: On amlodipine, hydralazine.  Currently blood pressure stable   Constipated: Enema ordered, but the patient had a large BM before it was ordered.   Chronic combined systolic/diastolic WUJ:WJXB 2D echo showed EF of 45 to 50%, no wall motion abnormality, grade 1 diastolic dysfunction.  Currently appears euvolemic.  Continue current medications   Hypothyroidism: Continue Synthyroid   Severe aortic stenosis: Status post TAVR on 1/23   Abnormal ABI: ABI done here showed b/l noncompressible lower extremity arteries. B/L abnormal  toe-brachial index.  Vascular surgery was consulted. Further intervention/investigation with arteriography not optimal as inpatient due to the patient's  poor renal function. The patient will follow up with vascular surgery as outpatient one month following discharge.   Vitamin B-12 deficiency: Takes supplementation at home     Nutrition Problem: Increased nutrient needs Etiology: wound healing   DVT prophylaxis:apixaban (ELIQUIS) tablet 2.5 mg Start: 05/06/23 1200 apixaban (ELIQUIS) tablet 2.5 mg      Code Status: Full Code   Family Communication: Daughter at bedside   Patient status:Inpatient   Patient is from :Home   Anticipated discharge JY:NWGN   Estimated DC date:after ID clearance     Consultants: Orthopedics,ID   Procedures:Left 4th  toe amputation   Antimicrobials:  Anti-infectives(From admission, onward)           Consultants: Orthopedic surgery Vascular surgery Infectious disease Procedures performed: Amputation of 4th digit left foot.  Disposition: Skilled nursing facility Diet recommendation:  Discharge Diet Orders (From admission, onward)     Start     Ordered   05/11/23 0000  Diet - low sodium heart healthy        05/11/23 1050           Cardiac diet DISCHARGE MEDICATION: Allergies as of 05/11/2023       Reactions   Amoxicillin Other (See Comments)   Acute interstitial nephritis   Doxycycline Hives   Flomax [tamsulosin Hcl] Other (See Comments)   Dizziness        Medication List     STOP taking these medications  azithromycin 500 MG tablet Commonly known as: ZITHROMAX   ferrous sulfate 325 (65 FE) MG EC tablet   mupirocin ointment 2 % Commonly known as: BACTROBAN   nitroGLYCERIN 0.4 MG SL tablet Commonly known as: NITROSTAT       TAKE these medications    acetaminophen 500 MG tablet Commonly known as: TYLENOL Take 1,000 mg by mouth See admin instructions. Take 1,000 mg by mouth in the morning and evening   allopurinol 100 MG tablet Commonly known as: ZYLOPRIM Take 1 tablet (100 mg total) by mouth daily.   ALPRAZolam 0.25 MG tablet Commonly known as:  XANAX Take 0.25 mg by mouth at bedtime.   amiodarone 200 MG tablet Commonly known as: PACERONE Take 1 tablet by mouth once daily   amLODipine 10 MG tablet Commonly known as: NORVASC Take 1 tablet (10 mg total) by mouth daily.   aspirin EC 81 MG tablet Take 1 tablet (81 mg total) by mouth daily. Swallow whole.   atorvastatin 10 MG tablet Commonly known as: LIPITOR Take 1 tablet (10 mg total) by mouth daily. What changed: when to take this   bisacodyl 5 MG EC tablet Commonly known as: DULCOLAX Take 1 tablet (5 mg total) by mouth daily. Hold for 24 hours if more than 2 loose bowel movements in one day.   carvedilol 12.5 MG tablet Commonly known as: COREG Take 12.5 mg by mouth 2 (two) times daily.   cefadroxil 500 MG capsule Commonly known as: DURICEF Take 1 capsule (500 mg total) by mouth 2 (two) times daily.   cyanocobalamin 1000 MCG tablet Commonly known as: VITAMIN B12 Take 1 tablet (1,000 mcg total) by mouth daily.   docusate sodium 100 MG capsule Commonly known as: COLACE Take 1 capsule (100 mg total) by mouth daily.   Eliquis 2.5 MG Tabs tablet Generic drug: apixaban Take 1 tablet (2.5 mg total) by mouth 2 (two) times daily.   feeding supplement Liqd Take 237 mLs by mouth 2 (two) times daily between meals.   nutrition supplement (JUVEN) Pack Take 1 packet by mouth 2 (two) times daily with a meal.   hydrALAZINE 25 MG tablet Commonly known as: APRESOLINE Take 1 tablet (25 mg total) by mouth 2 (two) times daily.   isosorbide mononitrate 30 MG 24 hr tablet Commonly known as: IMDUR Take 1 tablet (30 mg total) by mouth daily.   levothyroxine 75 MCG tablet Commonly known as: SYNTHROID Take 75 mcg by mouth daily before breakfast.   linezolid 600 MG tablet Commonly known as: ZYVOX Take 1 tablet (600 mg total) by mouth every 12 (twelve) hours.   mirtazapine 15 MG tablet Commonly known as: REMERON Take 7.5 mg by mouth at bedtime.   oxyCODONE 5 MG immediate  release tablet Commonly known as: Oxy IR/ROXICODONE Take 1 tablet (5 mg total) by mouth every 6 (six) hours as needed for moderate pain (pain score 4-6).   sodium bicarbonate 650 MG tablet Take 1,300 mg by mouth See admin instructions. Take 1,300 mg by mouth in the morning and evening        Contact information for after-discharge care     Destination     [facility] Preferred SNF .   Service: Skilled Nursing Contact information: [address] [place]  [address] [phone]                    Discharge Exam: Filed Weights   05/03/23 1238 05/04/23 1140 05/04/23 1146  Weight: 85.3  kg 85.3 kg 85.3 kg   Exam:  Constitutional:  The patient is awake, alert, and oriented x 3. No acute distress. Eyes:  pupils and irises appear normal Normal lids and conjunctivae ENMT:  grossly normal hearing  Lips appear normal external ears, nose appear normal Oropharynx: mucosa, tongue,posterior pharynx appear normal Neck:  neck appears normal, no masses, normal ROM, supple no thyromegaly Respiratory:  No increased work of breathing. No wheezes, rales, or rhonchi No tactile fremitus Cardiovascular:  Regular rate and rhythm No murmurs, ectopy, or gallups. No lateral PMI. No thrills. Abdomen:  Abdomen is soft, non-tender, non-distended No hernias, masses, or organomegaly Normoactive bowel sounds.  Musculoskeletal:  No cyanosis, clubbing, or edema Skin:  No rashes, lesions, ulcers palpation of skin: no induration or nodules Neurologic:  CN 2-12 intact Sensation all 4 extremities intact Psychiatric:  Mental status Mood, affect appropriate Orientation to person, place, time  judgment and insight appear intact   Condition at discharge: fair  The results of significant diagnostics from this hospitalization (including imaging, microbiology, ancillary and laboratory) are listed below for reference.   Imaging  Studies: IR Removal Tun Cv Cath W/O FL Result Date: 05/09/2023 INDICATION: Patient with history of left fourth toe osteomyelitis and prior right IJ tunneled central venous catheter placement on 03/04/2023; patient has completed IV antibiotic therapy and now switching to oral regimen ; request now received from Infectious Disease service for tunneled central venous catheter removal EXAM: REMOVAL TUNNELED CENTRAL VENOUS CATHETER MEDICATIONS: None ANESTHESIA/SEDATION: None FLUOROSCOPY: None COMPLICATIONS: None immediate. PROCEDURE: Informed consent was obtained from the patient after a thorough discussion of the procedural risks, benefits and alternatives. All questions were addressed. A timeout was performed prior to the initiation of the procedure. At the patient's bedside, the overlying catheter dressing was removed and suture cut. Using gentle manual traction the catheter was removed in it's entirety. Pressure was held till hemostasis was obtained. A sterile vaseline/gauze dressing was applied. The patient tolerated the procedure well with no immediate complications. IMPRESSION: Successful central venous catheter removal as described above. Performed by: Artemio Aly Electronically Signed   By: Richarda Overlie M.D.   On: 05/09/2023 16:55   VAS [place] ABI WITH/WO TBI Result Date: 05/05/2023  LOWER EXTREMITY DOPPLER STUDY Patient Name:  TELLIS SPIVAK  Date of Exam:   05/05/2023 Medical Rec #: [id number]          Accession #:    [id number] Date of Birth: Jan 15, 1941          Patient Gender: M Patient Age:   45 years Exam Location:  [facility] Procedure:      VAS [place] ABI WITH/WO TBI Referring Phys: Victorino Dike YATES --------------------------------------------------------------------------------  Indications: Osteomyelitis left foot, 4th toe High Risk Factors: Hypertension, hyperlipidemia, no history of smoking, prior                    MI. Other Factors: RLE 4th toe amputation (03/02/23), Afib, PM, CKD, s/p TAVR,  CHF.  Comparison Study: No previous exams Performing Technologist: Hill, Jody RVT, RDMS  Examination Guidelines: A complete evaluation includes at minimum, Doppler waveform signals and systolic blood pressure reading at the level of bilateral brachial, anterior tibial, and posterior tibial arteries, when vessel segments are accessible. Bilateral testing is considered an integral part of a complete examination. Photoelectric Plethysmograph (PPG) waveforms and toe systolic pressure readings are included as required and additional duplex testing as needed. Limited examinations for reoccurring indications may be performed as noted.  ABI Findings: +---------+------------------+-----+----------+----------------+ Right    Rt Pressure (mmHg)IndexWaveform  Comment          +---------+------------------+-----+----------+----------------+ Brachial 164                    biphasic                   +---------+------------------+-----+----------+----------------+ PTA                             monophasicnon compressible +---------+------------------+-----+----------+----------------+ DP       152               0.93 monophasic                 +---------+------------------+-----+----------+----------------+ Great Toe56                0.34 Abnormal                   +---------+------------------+-----+----------+----------------+ +---------+------------------+-----+---------+-------+ Left     Lt Pressure (mmHg)IndexWaveform Comment +---------+------------------+-----+---------+-------+ Brachial 155                    triphasic        +---------+------------------+-----+---------+-------+ PTA      254               1.55 biphasic         +---------+------------------+-----+---------+-------+ DP       254               1.55 triphasic        +---------+------------------+-----+---------+-------+ Great Toe92                0.56 Abnormal          +---------+------------------+-----+---------+-------+ Arterial wall calcification precludes accurate ankle pressures and ABIs.  Summary: Right: Resting right ankle-brachial index indicates noncompressible right lower extremity arteries. The right toe-brachial index is abnormal. Left: Resting left ankle-brachial index indicates noncompressible left lower extremity arteries. The left toe-brachial index is abnormal. *See table(s) above for measurements and observations.  Electronically signed by Delaney Fearing on 05/05/2023 at 10:48:15 AM.    Final    MR FOOT LEFT WO CONTRAST Result Date: 04/26/2023 CLINICAL DATA:  Cellulitis of the left foot EXAM: MRI OF THE LEFT FOOT WITHOUT CONTRAST TECHNIQUE: Multiplanar, multisequence MR imaging of the left foot was performed. No intravenous contrast was administered. COMPARISON:  Radiographs 03/07/2023 FINDINGS: Bones/Joint/Cartilage Abnormal marrow edema in the middle and distal phalanges of the fourth toe compatible with active osteomyelitis. Mild marrow edema in the tuft of the distal phalanx of the great toe such that early osteomyelitis in this location is not excluded. Minimal marrow edema in the bifid medial sesamoid, likely inflammatory/sesamoiditis but skeptical of infection involving the sesamoid. Ligaments Lisfranc ligament intact. Muscles and Tendons Moderate regional muscular atrophy. Soft tissues Mild dorsal subcutaneous edema in the forefoot. Subcutaneous edema in the fourth toe. No drainable abscess observed. IMPRESSION: 1. Osteomyelitis of the middle and distal phalanges of the fourth toe. 2. Mild marrow edema in the tuft of the distal phalanx of the great toe such that early osteomyelitis in this location is not excluded. 3. Minimal marrow edema in the bifid medial sesamoid, likely inflammatory/sesamoiditis but suspicious of infection involving the sesamoid. 4. Moderate regional muscular atrophy. 5. Mild dorsal subcutaneous edema in the forefoot. Electronically  Signed   By: Freida Jes M.D.   On: 04/26/2023 12:52    Microbiology:  Results for orders placed or performed during the hospital encounter of 05/03/23  Aerobic/Anaerobic Culture w Gram Stain (surgical/deep wound)     Status: None   Collection Time: 05/04/23  2:00 PM   Specimen: Toe, Left; Amputation  Result Value Ref Range Status   Specimen Description   Final    TOE LEFT 4TH TOE SPECIMEN A Performed at [facility], [address] [place], [place] [address]    Special Requests   Final    NONE Performed at [facility], [address], [place], [place] [address]    Gram Stain NO WBC SEEN NO ORGANISMS SEEN   Final   Culture   Final    No growth aerobically or anaerobically. Performed at [facility], [address], [place], [place] [address]    Report Status 05/09/2023 FINAL  Final  Aerobic/Anaerobic Culture w Gram Stain (surgical/deep wound)     Status: None   Collection Time: 05/04/23  2:22 PM   Specimen: Toe, Left; Amputation  Result Value Ref Range Status   Specimen Description   Final    TOE LEFT 4TH TOE CULTURE SPECIMEN B Performed at [facility], [address] [place], [place] [address]    Special Requests SWAB  Final   Gram Stain NO WBC SEEN NO ORGANISMS SEEN   Final   Culture   Final    No growth aerobically or anaerobically. Performed at [facility], [address], [place], [place] [address]    Report Status 05/09/2023 FINAL  Final    Labs: CBC: Recent Labs  Lab 05/06/23 0241 05/08/23 0429 05/10/23 0430 05/11/23 0428  WBC 4.5 5.8 3.5* 4.3  NEUTROABS  --  3.6 2.1 3.5  HGB 9.6* 9.3* 9.1* 9.6*  HCT 31.0* 30.4* 30.1* 31.5*  MCV 100.6* 100.7* 99.3 101.0*  PLT 65* 64* 98* 126*   Basic Metabolic Panel: Recent Labs  Lab 05/06/23 0241 05/08/23 0429 05/09/23 1419 05/10/23 0430 05/11/23 0428  NA 135 134* 134* 138 136  K 4.4 4.3 5.0 4.9 4.5  CL 111 108 107 109 109  CO2  19* 20* 17* 20* 18*  GLUCOSE 114* 113* 182* 129* 123*  BUN 59* 79* 97* 101* 102*  CREATININE 2.25* 2.50* 2.48* 2.50* 2.36*  CALCIUM 8.7* 9.2 9.2 9.4 9.4   Liver Function Tests: No results for input(s): "AST", "ALT", "ALKPHOS", "BILITOT", "PROT", "ALBUMIN" in the last 168 hours. CBG: No results for input(s): "GLUCAP" in the last 168 hours.  Discharge time spent: greater than 30 minutes.  Signed: Jazir Newey, DO [facility] 05/11/2023

## 2023-05-11 NOTE — Progress Notes (Signed)
 [facility] Liaison Note  05/11/2023  [name] 03-Dec-1940 [id number]  Location: RN Hospital Liaison screened the patient remotely at [facility].  Insurance: [facility] HMO   Jordan Oconnell is a 83 y.o. male who is a Primary Care Patient of Suan Elm, MD [facility]). The patient was screened for  day readmission hospitalization with noted high risk score for unplanned readmission risk with 2 IP in 6 months.  The patient was assessed for potential Care Management service needs for post hospital transition for care coordination. Review of patient's electronic medical record reveals patient was admitted for Osteomyelitis of fourth toe on left. Pt recommended for SNF and will transition to [facility] today for ongoing STR. This facility will continue to address pt's needs post hospital discharge. No VBCI services needed at this time.   VBCI Care Management/Population Health does not replace or interfere with any arrangements made by the Inpatient Transition of Care team.   For questions contact:   Lilla Reichert, RN, BSN Hospital Liaison [facility]   [facility], Population Health Office Hours MTWF  8:00 am-6:00 pm Direct Dial: [phone] [email or web address]

## 2023-05-12 ENCOUNTER — Other Ambulatory Visit (HOSPITAL_COMMUNITY): Payer: Self-pay

## 2023-05-12 DIAGNOSIS — M869 Osteomyelitis, unspecified: Secondary | ICD-10-CM | POA: Diagnosis not present

## 2023-05-12 DIAGNOSIS — I10 Essential (primary) hypertension: Secondary | ICD-10-CM | POA: Diagnosis not present

## 2023-05-12 DIAGNOSIS — M6281 Muscle weakness (generalized): Secondary | ICD-10-CM | POA: Diagnosis not present

## 2023-05-12 DIAGNOSIS — R531 Weakness: Secondary | ICD-10-CM | POA: Diagnosis not present

## 2023-05-12 DIAGNOSIS — M86172 Other acute osteomyelitis, left ankle and foot: Secondary | ICD-10-CM | POA: Diagnosis not present

## 2023-05-12 DIAGNOSIS — Z7401 Bed confinement status: Secondary | ICD-10-CM | POA: Diagnosis not present

## 2023-05-12 DIAGNOSIS — N184 Chronic kidney disease, stage 4 (severe): Secondary | ICD-10-CM | POA: Diagnosis not present

## 2023-05-12 DIAGNOSIS — I5042 Chronic combined systolic (congestive) and diastolic (congestive) heart failure: Secondary | ICD-10-CM | POA: Diagnosis not present

## 2023-05-12 DIAGNOSIS — I480 Paroxysmal atrial fibrillation: Secondary | ICD-10-CM | POA: Diagnosis not present

## 2023-05-12 DIAGNOSIS — E039 Hypothyroidism, unspecified: Secondary | ICD-10-CM | POA: Diagnosis not present

## 2023-05-12 DIAGNOSIS — Z952 Presence of prosthetic heart valve: Secondary | ICD-10-CM | POA: Diagnosis not present

## 2023-05-12 DIAGNOSIS — Z89422 Acquired absence of other left toe(s): Secondary | ICD-10-CM | POA: Diagnosis not present

## 2023-05-12 DIAGNOSIS — R262 Difficulty in walking, not elsewhere classified: Secondary | ICD-10-CM | POA: Diagnosis not present

## 2023-05-12 DIAGNOSIS — I70244 Atherosclerosis of native arteries of left leg with ulceration of heel and midfoot: Secondary | ICD-10-CM | POA: Diagnosis not present

## 2023-05-12 DIAGNOSIS — C689 Malignant neoplasm of urinary organ, unspecified: Secondary | ICD-10-CM | POA: Diagnosis not present

## 2023-05-12 LAB — CK: Total CK: 198 U/L (ref 49–397)

## 2023-05-12 MED ORDER — OXYCODONE HCL 5 MG PO TABS: 5.0000 mg | ORAL_TABLET | Freq: Four times a day (QID) | ORAL | 0 refills | Status: DC | PRN

## 2023-05-12 MED ORDER — ALPRAZOLAM 0.25 MG PO TABS: 0.2500 mg | ORAL_TABLET | Freq: Every day | ORAL | 0 refills | Status: DC

## 2023-05-12 NOTE — Care Management Important Message (Signed)
 Important Message  Patient Details IM Letter given. Name: Jordan Oconnell MRN: [id number] Date of Birth: 1940/11/12   Important Message Given:  Yes - Medicare IM     Curtiss Dowdy 05/12/2023, 10:46 AM

## 2023-05-12 NOTE — Discharge Summary (Signed)
 Physician Discharge Summary   Patient: Jordan Oconnell MRN: [id number] DOB: May 30, 1940  Admit date:     05/03/2023  Discharge date: 05/12/23  Discharge Physician: Fran Lowes   PCP: Geoffry Paradise, MD   Recommendations at discharge:    Discharge to SNF for PT/OT. Strict weight bearing on heel only for left foot. Follow up with orthopedic surgery  for wound check. Follow up with orthopedic surgery in 3 weeks for suture removal Follow up with PCP in 7-10 days. Chemistry to be drawn on visit to PCP to be reported to PCP. Follow up with vascular surgery in one month.  Discharge Diagnoses: Principal Problem:   Osteomyelitis of fourth toe of left foot (HCC) Active Problems:   Urothelial cancer (HCC)   PAF (paroxysmal atrial fibrillation) (HCC)   CKD (chronic kidney disease) stage 4, GFR 15-29 ml/min (HCC)   S/P TAVR (transcatheter aortic valve replacement)   Pacemaker   Chronic combined systolic and diastolic CHF (congestive heart failure) (HCC)   Hypothyroidism   Primary hypertension  Resolved Problems:   * No resolved hospital problems. * Brief hospital course: Patient is a 83 year old male with history of bladder cancer, stage IV CKD, BPH, hypertension, hyperlipidemia, A-fib, status post pacemaker, status post TAVR who presented for direct admission from orthopedics for evaluation of left foot osteomyelitis.  MRI confirmed osteomyelitis of 4th left which with done as outpatient.  He was scheduled for direct admission for amputation due to concerns of TAVR, pacemaker.  Orthopedics consulted, status post left fourth toe disarticulation at metatarsophalangeal joint on 4/9.ID also following.  Pending wound culture and determination of optimal antibiotic choice, route, and duration by ID.   The patient's mobility has been significantly hindered by what appears to be osteoarthritis. He has seen some improvement with mobility exercises recommended by nursing that he performs in the bed  to reduce stiffness in his joints. He will require rehab at discharge.   Infectious disease states that the patient will require linezolid and cefadroxil through 05/17/2023.    On 05/10/2023 the patient is complaining of severe discomfort, because he has not had a BM. An enema was order, but his constipation was relieved before it was performed.   The patient will discharge to SNF today after Orthopedic surgery has evaluated the wound.   Assessment and Plan:  Principal Problem:   Osteomyelitis of fourth toe of left foot (HCC) Active Problems:   Urothelial cancer (HCC)   PAF (paroxysmal atrial fibrillation) (HCC)   CKD (chronic kidney disease) stage 4, GFR 15-29 ml/min (HCC)   S/P TAVR (transcatheter aortic valve replacement)   Pacemaker   Chronic combined systolic and diastolic CHF (congestive heart failure) (HCC)   Hypothyroidism   Primary hypertension     Left fourth toe osteomyelitis: MRI findings were concerning for osteomyelitis.  Direct admission as per orthopedics.   ID also consulted and following.  Orthopedics consulted, status post left fourth toe disarticulation at metatarsophalangeal joint on 4/9.ID also following. On daptomycin, ceftriaxone.  Wound culture report does not show any organism or WBC. Also has history of right fourth toe amputation on 03/02/2023, finished antibiotics course with ceftriaxone, daptomycin.  Discussed with ID today.  ID wants to wait until Sunday if the culture grows anything.  Decision will be made on Sunday either to send him home with oral or iv antibiotics PT consulted, recommended home health. ID has decided to continue IV antibiotics for now. When antibiotics are converted to oral will arrange for removal of  tunneled catheter. His antibiotic therapy has been changed to oral linezolid and cefadroxil on 05/09/2023 with the end of therapy scheduled on 05/17/2023. He will need to follow up with ID as outpatient on 05/16/2023. Infectious disease states that  the patient will require linezolid and cefadroxil through 05/17/2023.   On the day of surgery (05/04/2023) [facility] stated that they wanted to evaluate the wound in one week. I have contacted orthopedic surgery to alert them that the patient is discharging today, and they will need to see the patient prior to discharge if they wanted to see him one week after surgery.   History of coronary artery disease: Currently denies any anginal symptoms.  Last hospitalization, troponin was elevated to 400s.  He declined cardiac cath and opted to manage medically.  Last 2D echo showed EF of 45 to 50%, no wall motion abnormality, grade 1 diastolic dysfunction.  Continue aspirin, statin, Imdur   History of urothelial cancer: Status post urostomy.  Status post left nephrectomy with recurrent disease in the solitary right kidney.  Follows with [facility], oncology and nephrology.  Continue urostomy care.  Next follow-up at [facility] EKG scheduled for 4/17.  Was taking pembrolizumab.  There is some concern for bleeding around the ostomy site.  Wound care  ostomy consulted.   Paroxysmal A-fib/A-flutter/tachybradycardia syndrome: Status post pacemaker.  Continue carvedilol, amiodarone for rate control.  On Eliquis at home ,will resume   Hypertension: On amlodipine, hydralazine.  Currently blood pressure stable   Constipated: Enema ordered, but the patient had a large BM before it was ordered.   Chronic combined systolic/diastolic ZOX:WRUE 2D echo showed EF of 45 to 50%, no wall motion abnormality, grade 1 diastolic dysfunction.  Currently appears euvolemic.  Continue current medications   Hypothyroidism: Continue Synthyroid   Severe aortic stenosis: Status post TAVR on 1/23   Abnormal ABI: ABI done here showed b/l noncompressible lower extremity arteries. B/L abnormal  toe-brachial index.  Vascular surgery was consulted. Further intervention/investigation with arteriography not optimal as inpatient due to the patient's  poor renal function. The patient will follow up with vascular surgery as outpatient one month following discharge.   Vitamin B-12 deficiency: Takes supplementation at home     Nutrition Problem: Increased nutrient needs Etiology: wound healing   DVT prophylaxis:apixaban (ELIQUIS) tablet 2.5 mg Start: 05/06/23 1200 apixaban (ELIQUIS) tablet 2.5 mg      Code Status: Full Code   Family Communication: Daughter at bedside   Patient status:Inpatient   Patient is from :Home   Anticipated discharge AV:[facility]UJ[facility]   Estimated DC date:after ID clearance     Consultants: Orthopedics,ID   Procedures:Left 4th  toe amputation   Antimicrobials:  Anti-infectives(From admission, onward)           Consultants: Orthopedic surgery Vascular surgery Infectious disease Procedures performed: Amputation of 4th digit left foot.  Disposition: Skilled nursing facility Diet recommendation:  Discharge Diet Orders (From admission, onward)     Start     Ordered   05/11/23 0000  Diet - low sodium heart healthy        05/11/23 1050           Cardiac diet DISCHARGE MEDICATION: Allergies as of 05/12/2023       Reactions   Amoxicillin Other (See Comments)   Acute interstitial nephritis   Doxycycline Hives   Flomax [tamsulosin Hcl] Other (See Comments)   Dizziness        Medication List     STOP taking these medications  azithromycin 500 MG tablet Commonly known as: ZITHROMAX   ferrous sulfate 325 (65 FE) MG EC tablet   mupirocin ointment 2 % Commonly known as: BACTROBAN   nitroGLYCERIN 0.4 MG SL tablet Commonly known as: NITROSTAT       TAKE these medications    acetaminophen 500 MG tablet Commonly known as: TYLENOL Take 1,000 mg by mouth See admin instructions. Take 1,000 mg by mouth in the morning and evening   allopurinol 100 MG tablet Commonly known as: ZYLOPRIM Take 1 tablet (100 mg total) by mouth daily.   ALPRAZolam 0.25 MG tablet Commonly known as:  XANAX Take 0.25 mg by mouth at bedtime.   amiodarone 200 MG tablet Commonly known as: PACERONE Take 1 tablet by mouth once daily   amLODipine 10 MG tablet Commonly known as: NORVASC Take 1 tablet (10 mg total) by mouth daily.   aspirin EC 81 MG tablet Take 1 tablet (81 mg total) by mouth daily. Swallow whole.   atorvastatin 10 MG tablet Commonly known as: LIPITOR Take 1 tablet (10 mg total) by mouth daily. What changed: when to take this   bisacodyl 5 MG EC tablet Commonly known as: DULCOLAX Take 1 tablet (5 mg total) by mouth daily. Hold for 24 hours if more than 2 loose bowel movements in one day.   carvedilol 12.5 MG tablet Commonly known as: COREG Take 12.5 mg by mouth 2 (two) times daily.   cefadroxil 500 MG capsule Commonly known as: DURICEF Take 1 capsule (500 mg total) by mouth 2 (two) times daily.   cyanocobalamin 1000 MCG tablet Commonly known as: VITAMIN B12 Take 1 tablet (1,000 mcg total) by mouth daily.   docusate sodium 100 MG capsule Commonly known as: COLACE Take 1 capsule (100 mg total) by mouth daily.   Eliquis 2.5 MG Tabs tablet Generic drug: apixaban Take 1 tablet (2.5 mg total) by mouth 2 (two) times daily.   feeding supplement Liqd Take 237 mLs by mouth 2 (two) times daily between meals.   nutrition supplement (JUVEN) Pack Take 1 packet by mouth 2 (two) times daily with a meal.   hydrALAZINE 25 MG tablet Commonly known as: APRESOLINE Take 1 tablet (25 mg total) by mouth 2 (two) times daily.   isosorbide mononitrate 30 MG 24 hr tablet Commonly known as: IMDUR Take 1 tablet (30 mg total) by mouth daily.   levothyroxine 75 MCG tablet Commonly known as: SYNTHROID Take 75 mcg by mouth daily before breakfast.   linezolid 600 MG tablet Commonly known as: ZYVOX Take 1 tablet (600 mg total) by mouth every 12 (twelve) hours.   mirtazapine 15 MG tablet Commonly known as: REMERON Take 7.5 mg by mouth at bedtime.   oxyCODONE 5 MG immediate  release tablet Commonly known as: Oxy IR/ROXICODONE Take 1 tablet (5 mg total) by mouth every 6 (six) hours as needed for moderate pain (pain score 4-6).   sodium bicarbonate 650 MG tablet Take 1,300 mg by mouth See admin instructions. Take 1,300 mg by mouth in the morning and evening        Contact information for after-discharge care     Destination     [facility] Preferred SNF .   Service: Skilled Nursing Contact information: [address] [place]  [address] [phone]                    Discharge Exam: Filed Weights   05/03/23 1238 05/04/23 1140 05/04/23 1146  Weight: 85.3  kg 85.3 kg 85.3 kg   Exam:  Constitutional:  The patient is awake, alert, and oriented x 3. No acute distress. Eyes:  pupils and irises appear normal Normal lids and conjunctivae ENMT:  grossly normal hearing  Lips appear normal external ears, nose appear normal Oropharynx: mucosa, tongue,posterior pharynx appear normal Neck:  neck appears normal, no masses, normal ROM, supple no thyromegaly Respiratory:  No increased work of breathing. No wheezes, rales, or rhonchi No tactile fremitus Cardiovascular:  Regular rate and rhythm No murmurs, ectopy, or gallups. No lateral PMI. No thrills. Abdomen:  Abdomen is soft, non-tender, non-distended No hernias, masses, or organomegaly Normoactive bowel sounds.  Musculoskeletal:  No cyanosis, clubbing, or edema Skin:  No rashes, lesions, ulcers palpation of skin: no induration or nodules Neurologic:  CN 2-12 intact Sensation all 4 extremities intact Psychiatric:  Mental status Mood, affect appropriate Orientation to person, place, time  judgment and insight appear intact   Condition at discharge: fair  The results of significant diagnostics from this hospitalization (including imaging, microbiology, ancillary and laboratory) are listed below for reference.   Imaging  Studies: IR Removal Tun Cv Cath W/O FL Result Date: 05/09/2023 INDICATION: Patient with history of left fourth toe osteomyelitis and prior right IJ tunneled central venous catheter placement on 03/04/2023; patient has completed IV antibiotic therapy and now switching to oral regimen ; request now received from Infectious Disease service for tunneled central venous catheter removal EXAM: REMOVAL TUNNELED CENTRAL VENOUS CATHETER MEDICATIONS: None ANESTHESIA/SEDATION: None FLUOROSCOPY: None COMPLICATIONS: None immediate. PROCEDURE: Informed consent was obtained from the patient after a thorough discussion of the procedural risks, benefits and alternatives. All questions were addressed. A timeout was performed prior to the initiation of the procedure. At the patient's bedside, the overlying catheter dressing was removed and suture cut. Using gentle manual traction the catheter was removed in it's entirety. Pressure was held till hemostasis was obtained. A sterile vaseline/gauze dressing was applied. The patient tolerated the procedure well with no immediate complications. IMPRESSION: Successful central venous catheter removal as described above. Performed by: Artemio Aly Electronically Signed   By: Richarda Overlie M.D.   On: 05/09/2023 16:55   VAS [place] ABI WITH/WO TBI Result Date: 05/05/2023  LOWER EXTREMITY DOPPLER STUDY Patient Name:  ARAFAT COCUZZA  Date of Exam:   05/05/2023 Medical Rec #: [id number]          Accession #:    [id number] Date of Birth: [birth year]          Patient Gender: M Patient Age:   76 years Exam Location:  [facility] Procedure:      VAS [place] ABI WITH/WO TBI Referring Phys: Victorino Dike YATES --------------------------------------------------------------------------------  Indications: Osteomyelitis left foot, 4th toe High Risk Factors: Hypertension, hyperlipidemia, no history of smoking, prior                    MI. Other Factors: RLE 4th toe amputation (03/02/23), Afib, PM, CKD, s/p TAVR,  CHF.  Comparison Study: No previous exams Performing Technologist: Hill, Jody RVT, RDMS  Examination Guidelines: A complete evaluation includes at minimum, Doppler waveform signals and systolic blood pressure reading at the level of bilateral brachial, anterior tibial, and posterior tibial arteries, when vessel segments are accessible. Bilateral testing is considered an integral part of a complete examination. Photoelectric Plethysmograph (PPG) waveforms and toe systolic pressure readings are included as required and additional duplex testing as needed. Limited examinations for reoccurring indications may be performed as noted.  ABI Findings: +---------+------------------+-----+----------+----------------+ Right    Rt Pressure (mmHg)IndexWaveform  Comment          +---------+------------------+-----+----------+----------------+ Brachial 164                    biphasic                   +---------+------------------+-----+----------+----------------+ PTA                             monophasicnon compressible +---------+------------------+-----+----------+----------------+ DP       152               0.93 monophasic                 +---------+------------------+-----+----------+----------------+ Great Toe56                0.34 Abnormal                   +---------+------------------+-----+----------+----------------+ +---------+------------------+-----+---------+-------+ Left     Lt Pressure (mmHg)IndexWaveform Comment +---------+------------------+-----+---------+-------+ Brachial 155                    triphasic        +---------+------------------+-----+---------+-------+ PTA      254               1.55 biphasic         +---------+------------------+-----+---------+-------+ DP       254               1.55 triphasic        +---------+------------------+-----+---------+-------+ Great Toe92                0.56 Abnormal          +---------+------------------+-----+---------+-------+ Arterial wall calcification precludes accurate ankle pressures and ABIs.  Summary: Right: Resting right ankle-brachial index indicates noncompressible right lower extremity arteries. The right toe-brachial index is abnormal. Left: Resting left ankle-brachial index indicates noncompressible left lower extremity arteries. The left toe-brachial index is abnormal. *See table(s) above for measurements and observations.  Electronically signed by Delaney Fearing on 05/05/2023 at 10:48:15 AM.    Final    MR FOOT LEFT WO CONTRAST Result Date: 04/26/2023 CLINICAL DATA:  Cellulitis of the left foot EXAM: MRI OF THE LEFT FOOT WITHOUT CONTRAST TECHNIQUE: Multiplanar, multisequence MR imaging of the left foot was performed. No intravenous contrast was administered. COMPARISON:  Radiographs 03/07/2023 FINDINGS: Bones/Joint/Cartilage Abnormal marrow edema in the middle and distal phalanges of the fourth toe compatible with active osteomyelitis. Mild marrow edema in the tuft of the distal phalanx of the great toe such that early osteomyelitis in this location is not excluded. Minimal marrow edema in the bifid medial sesamoid, likely inflammatory/sesamoiditis but skeptical of infection involving the sesamoid. Ligaments Lisfranc ligament intact. Muscles and Tendons Moderate regional muscular atrophy. Soft tissues Mild dorsal subcutaneous edema in the forefoot. Subcutaneous edema in the fourth toe. No drainable abscess observed. IMPRESSION: 1. Osteomyelitis of the middle and distal phalanges of the fourth toe. 2. Mild marrow edema in the tuft of the distal phalanx of the great toe such that early osteomyelitis in this location is not excluded. 3. Minimal marrow edema in the bifid medial sesamoid, likely inflammatory/sesamoiditis but suspicious of infection involving the sesamoid. 4. Moderate regional muscular atrophy. 5. Mild dorsal subcutaneous edema in the forefoot. Electronically  Signed   By: Freida Jes M.D.   On: 04/26/2023 12:52    Microbiology:  Results for orders placed or performed during the hospital encounter of 05/03/23  Aerobic/Anaerobic Culture w Gram Stain (surgical/deep wound)     Status: None   Collection Time: 05/04/23  2:00 PM   Specimen: Toe, Left; Amputation  Result Value Ref Range Status   Specimen Description   Final    TOE LEFT 4TH TOE SPECIMEN A Performed at [facility], [address] [place], [place] [address]    Special Requests   Final    NONE Performed at [facility], [address], [place], [place] [address]    Gram Stain NO WBC SEEN NO ORGANISMS SEEN   Final   Culture   Final    No growth aerobically or anaerobically. Performed at [facility], [address] [place], [place] [address]    Report Status 05/09/2023 FINAL  Final  Aerobic/Anaerobic Culture w Gram Stain (surgical/deep wound)     Status: None   Collection Time: 05/04/23  2:22 PM   Specimen: Toe, Left; Amputation  Result Value Ref Range Status   Specimen Description   Final    TOE LEFT 4TH TOE CULTURE SPECIMEN B Performed at [facility], [address], [place], [place] [address]    Special Requests SWAB  Final   Gram Stain NO WBC SEEN NO ORGANISMS SEEN   Final   Culture   Final    No growth aerobically or anaerobically. Performed at [facility], [address] [place], [place] [address]    Report Status 05/09/2023 FINAL  Final    Labs: CBC: Recent Labs  Lab 05/06/23 0241 05/08/23 0429 05/10/23 0430 05/11/23 0428  WBC 4.5 5.8 3.5* 4.3  NEUTROABS  --  3.6 2.1 3.5  HGB 9.6* 9.3* 9.1* 9.6*  HCT 31.0* 30.4* 30.1* 31.5*  MCV 100.6* 100.7* 99.3 101.0*  PLT 65* 64* 98* 126*   Basic Metabolic Panel: Recent Labs  Lab 05/06/23 0241 05/08/23 0429 05/09/23 1419 05/10/23 0430 05/11/23 0428  NA 135 134* 134* 138 136  K 4.4 4.3 5.0 4.9 4.5  CL 111 108 107 109 109  CO2  19* 20* 17* 20* 18*  GLUCOSE 114* 113* 182* 129* 123*  BUN 59* 79* 97* 101* 102*  CREATININE 2.25* 2.50* 2.48* 2.50* 2.36*  CALCIUM 8.7* 9.2 9.2 9.4 9.4   Liver Function Tests: No results for input(s): "AST", "ALT", "ALKPHOS", "BILITOT", "PROT", "ALBUMIN" in the last 168 hours. CBG: No results for input(s): "GLUCAP" in the last 168 hours.  Discharge time spent: greater than 30 minutes.  Signed: Willistine Ferrall, DO [facility] 05/12/2023

## 2023-05-12 NOTE — [place] FL2 (Signed)
 [place] MEDICAID FL2 LEVEL OF CARE FORM     IDENTIFICATION  Patient Name: Jordan Oconnell Birthdate: [birth year] Sex: male Admission Date (Current Location): 05/03/2023  [facility] and [place] Number:  Producer, television/film/video and Address:  [facility],  [address] [place], [place] [address]      Provider Number: [id number]  Attending Physician Name and Address:  Fran Lowes, DO  Relative Name and Phone Number:       Current Level of Care: Hospital Recommended Level of Care: Skilled Nursing Facility Prior Approval Number:    Date Approved/Denied:   PASRR Number: [id number]  Discharge Plan: SNF    Current Diagnoses: Patient Active Problem List   Diagnosis Date Noted   Osteomyelitis of fourth toe of left foot (HCC) 05/03/2023   Osteomyelitis of left foot (HCC) 04/26/2023   Toe pain, left 04/15/2023   Non-ST elevation (NSTEMI) myocardial infarction (HCC) 03/08/2023   Primary hypertension 03/08/2023   Osteomyelitis of fourth toe of right foot (HCC) 03/01/2023   Chronic combined systolic and diastolic CHF (congestive heart failure) (HCC) 03/01/2023   Hypothyroidism 03/01/2023   Acute renal failure superimposed on stage 4 chronic kidney disease (HCC) 03/13/2022   Acute on chronic systolic CHF (congestive heart failure) (HCC) 03/13/2022   Tachycardia-bradycardia syndrome (HCC) 03/11/2022   Atrial fibrillation with rapid ventricular response (HCC) 03/11/2022   Atypical atrial flutter (HCC) 03/03/2022   Pacemaker 02/12/2021   LBBB (left bundle branch block) 01/29/2021   1st degree AV block 01/29/2021   S/P TAVR (transcatheter aortic valve replacement) 01/27/2021   Severe aortic stenosis 10/05/2020   CKD (chronic kidney disease) stage 4, GFR 15-29 ml/min (HCC) 02/01/2017   Pneumonia 07/26/2013   Intractable hiccups 07/22/2013   Anemia 07/22/2013   Protein-calorie malnutrition, severe (HCC) 07/19/2013   Syncope and collapse 07/17/2013   Bladder cancer (HCC)  07/17/2013   Urothelial cancer (HCC) 09/03/2012   Essential hypertension 09/03/2012   PAF (paroxysmal atrial fibrillation) (HCC) 09/03/2012    Orientation RESPIRATION BLADDER Height & Weight     Self, Time, Situation, Place  Normal Urostomy Weight: 85.3 kg Height:  6\' 1"  (185.4 cm)  BEHAVIORAL SYMPTOMS/MOOD NEUROLOGICAL BOWEL NUTRITION STATUS      Incontinent Diet  AMBULATORY STATUS COMMUNICATION OF NEEDS Skin   Extensive Assist Verbally Surgical wounds                       Personal Care Assistance Level of Assistance  Bathing, Dressing Bathing Assistance: Limited assistance   Dressing Assistance: Limited assistance     Functional Limitations Info             SPECIAL CARE FACTORS FREQUENCY  PT (By licensed PT), OT (By licensed OT)     PT Frequency: 5x Weekly OT Frequency: 5x Weekly            Contractures Contractures Info: Not present    Additional Factors Info  Allergies   Allergies Info: Amoxicillin, Doxycycline, Flomax (Tamsulosin Hcl)           Current Medications (05/12/2023):  This is the current hospital active medication list Current Facility-Administered Medications  Medication Dose Route Frequency Provider Last Rate Last Admin   acetaminophen (TYLENOL) tablet 1,000 mg  1,000 mg Oral Q6H Swayze, Ava, DO   1,000 mg at 05/12/23 0900   allopurinol (ZYLOPRIM) tablet 100 mg  100 mg Oral Daily Jonah Blue, MD   100 mg at 05/12/23 0943   ALPRAZolam Prudy Feeler) tablet 0.25 mg  0.25 mg Oral QHS Jonah Blue, MD   0.25 mg at 05/11/23 2208   amiodarone (PACERONE) tablet 200 mg  200 mg Oral Daily Jonah Blue, MD   200 mg at 05/12/23 0942   amLODipine (NORVASC) tablet 10 mg  10 mg Oral Daily Jonah Blue, MD   10 mg at 05/12/23 0944   apixaban (ELIQUIS) tablet 2.5 mg  2.5 mg Oral BID Swayze, Ava, DO   2.5 mg at 05/12/23 1610   aspirin EC tablet 81 mg  81 mg Oral Daily Jonah Blue, MD   81 mg at 05/12/23 9604   atorvastatin (LIPITOR) tablet  10 mg  10 mg Oral Daily Jonah Blue, MD   10 mg at 05/12/23 5409   bisacodyl (DULCOLAX) EC tablet 5 mg  5 mg Oral Daily PRN Jonah Blue, MD   5 mg at 05/10/23 1006   carvedilol (COREG) tablet 12.5 mg  12.5 mg Oral BID Jonah Blue, MD   12.5 mg at 05/12/23 8119   cefadroxil (DURICEF) capsule 500 mg  500 mg Oral BID Odette Fraction, MD   500 mg at 05/12/23 1478   docusate sodium (COLACE) capsule 100 mg  100 mg Oral BID Jonah Blue, MD   100 mg at 05/12/23 0944   feeding supplement (ENSURE ENLIVE / ENSURE PLUS) liquid 237 mL  237 mL Oral BID BM Adhikari, Amrit, MD   237 mL at 05/11/23 2019   hydrALAZINE (APRESOLINE) injection 5 mg  5 mg Intravenous Q4H PRN Jonah Blue, MD       hydrALAZINE (APRESOLINE) tablet 25 mg  25 mg Oral BID Jonah Blue, MD   25 mg at 05/12/23 2956   isosorbide mononitrate (IMDUR) 24 hr tablet 30 mg  30 mg Oral Daily Jonah Blue, MD   30 mg at 05/12/23 2130   levothyroxine (SYNTHROID) tablet 75 mcg  75 mcg Oral QAC breakfast Jonah Blue, MD   75 mcg at 05/12/23 8657   linezolid (ZYVOX) tablet 600 mg  600 mg Oral Q12H Odette Fraction, MD   600 mg at 05/12/23 0944   mirtazapine (REMERON) tablet 7.5 mg  7.5 mg Oral Noemi Chapel, MD   7.5 mg at 05/11/23 2208   nutrition supplement (JUVEN) (JUVEN) powder packet 1 packet  1 packet Oral BID WC Burnadette Pop, MD   1 packet at 05/12/23 0900   ondansetron (ZOFRAN) tablet 4 mg  4 mg Oral Q6H PRN Jonah Blue, MD       Or   ondansetron [facility]) injection 4 mg  4 mg Intravenous Q6H PRN Jonah Blue, MD   4 mg at 05/04/23 1433   oxyCODONE (Oxy IR/ROXICODONE) immediate release tablet 5 mg  5 mg Oral Q6H PRN Jonah Blue, MD   5 mg at 05/07/23 0802   polyethylene glycol (MIRALAX / GLYCOLAX) packet 17 g  17 g Oral Daily PRN Jonah Blue, MD   17 g at 05/10/23 0758   sodium bicarbonate tablet 1,300 mg  1,300 mg Oral BID Jonah Blue, MD   1,300 mg at 05/12/23 0942   sodium chloride  flush (NS) 0.9 % injection 10-40 mL  10-40 mL Intracatheter Q12H Jonah Blue, MD   10 mL at 05/12/23 0944   sodium chloride flush (NS) 0.9 % injection 10-40 mL  10-40 mL Intracatheter PRN Jonah Blue, MD       vancomycin Robet Leu) powder    PRN Netta Cedars, MD   1,000 mg at 05/04/23 1455   zinc sulfate (50mg  elemental zinc)  capsule 220 mg  220 mg Oral Daily Burnadette Pop, MD   220 mg at 05/12/23 2130     Discharge Medications: Please see discharge summary for a list of discharge medications. STOP taking these medications     azithromycin 500 MG tablet Commonly known as: ZITHROMAX    ferrous sulfate 325 (65 FE) MG EC tablet    mupirocin ointment 2 % Commonly known as: BACTROBAN    nitroGLYCERIN 0.4 MG SL tablet Commonly known as: NITROSTAT           TAKE these medications     acetaminophen 500 MG tablet Commonly known as: TYLENOL Take 1,000 mg by mouth See admin instructions. Take 1,000 mg by mouth in the morning and evening    allopurinol 100 MG tablet Commonly known as: ZYLOPRIM Take 1 tablet (100 mg total) by mouth daily.    ALPRAZolam 0.25 MG tablet Commonly known as: XANAX Take 0.25 mg by mouth at bedtime.    amiodarone 200 MG tablet Commonly known as: PACERONE Take 1 tablet by mouth once daily    amLODipine 10 MG tablet Commonly known as: NORVASC Take 1 tablet (10 mg total) by mouth daily.    aspirin EC 81 MG tablet Take 1 tablet (81 mg total) by mouth daily. Swallow whole.    atorvastatin 10 MG tablet Commonly known as: LIPITOR Take 1 tablet (10 mg total) by mouth daily. What changed: when to take this    bisacodyl 5 MG EC tablet Commonly known as: DULCOLAX Take 1 tablet (5 mg total) by mouth daily. Hold for 24 hours if more than 2 loose bowel movements in one day.    carvedilol 12.5 MG tablet Commonly known as: COREG Take 12.5 mg by mouth 2 (two) times daily.    cefadroxil 500 MG capsule Commonly known as: DURICEF Take 1  capsule (500 mg total) by mouth 2 (two) times daily.    cyanocobalamin 1000 MCG tablet Commonly known as: VITAMIN B12 Take 1 tablet (1,000 mcg total) by mouth daily.    docusate sodium 100 MG capsule Commonly known as: COLACE Take 1 capsule (100 mg total) by mouth daily.    Eliquis 2.5 MG Tabs tablet Generic drug: apixaban Take 1 tablet (2.5 mg total) by mouth 2 (two) times daily.    feeding supplement Liqd Take 237 mLs by mouth 2 (two) times daily between meals.    nutrition supplement (JUVEN) Pack Take 1 packet by mouth 2 (two) times daily with a meal.    hydrALAZINE 25 MG tablet Commonly known as: APRESOLINE Take 1 tablet (25 mg total) by mouth 2 (two) times daily.    isosorbide mononitrate 30 MG 24 hr tablet Commonly known as: IMDUR Take 1 tablet (30 mg total) by mouth daily.    levothyroxine 75 MCG tablet Commonly known as: SYNTHROID Take 75 mcg by mouth daily before breakfast.    linezolid 600 MG tablet Commonly known as: ZYVOX Take 1 tablet (600 mg total) by mouth every 12 (twelve) hours.    mirtazapine 15 MG tablet Commonly known as: REMERON Take 7.5 mg by mouth at bedtime.    oxyCODONE 5 MG immediate release tablet Commonly known as: Oxy IR/ROXICODONE Take 1 tablet (5 mg total) by mouth every 6 (six) hours as needed for moderate pain (pain score 4-6).    sodium bicarbonate 650 MG tablet Take 1,300 mg by mouth See admin instructions. Take 1,300 mg by mouth in the morning and evening   Relevant Imaging Results:  Relevant Lab Results:   Additional Information SS# [id number]  Kathryn Parish, RN

## 2023-05-12 NOTE — TOC Transition Note (Signed)
 Transition of Care [facility]) - Discharge Note   Patient Details  Name: Jordan Oconnell MRN: [id number] Date of Birth: February 27, 1940  [facility]) CM/SW Contact:  Kathryn Parish, RN Phone Number: 05/12/2023, 10:18 AM   Clinical Narrative:    Patient will DC [id number] Anticipated DC date: 05/12/2023 Family notified: Loetta Ringer Transport [id number]   Per MD patient ready for DC to [facility] . RN to call report prior to discharge [phone] ext. [phone] Rm [phone]. RN, patient, patient's family, and facility notified of DC. Discharge Summary and FL2 sent to facility. DC packet on chart includes medical necessity, face sheet, discharge summary and signed prescription. Ambulance transport requested for patient at 10:15 [facility]).  NCM will sign off for now. Please consult us  again if new needs arise.    Final next level of care: Skilled Nursing Facility Barriers to Discharge: Barriers Resolved   Patient Goals and CMS Choice Patient states their goals for this hospitalization and ongoing recovery are:: return home CMS Medicare.gov Compare Post Acute Care list provided to::  (NA) Choice offered to / list presented to : NA [place] ownership interest in [facility]provided to::  (NA)    Discharge Placement   Existing PASRR number confirmed : 05/12/23          Patient chooses bed at: [facility] Patient to be transferred to facility by: [facility] Name of family member notified: Loetta Ringer Patient and family notified of of transfer: 05/11/23  Discharge Plan and Services Additional resources added to the After Visit Summary for   In-house Referral: NA              DME Arranged: N/A DME Agency: NA       HH Arranged: NA HH Agency: NA        Social Drivers of Health (SDOH) Interventions SDOH Screenings   Food Insecurity: No Food Insecurity (05/03/2023)  Housing: Low Risk  (05/03/2023)  Transportation Needs: No Transportation Needs (05/03/2023)  Utilities:  Not At Risk (05/03/2023)  Social Connections: Unknown (05/03/2023)  Recent Concern: Social Connections - Moderately Isolated (03/12/2023)  Tobacco Use: Low Risk  (05/04/2023)     Readmission Risk Interventions    05/04/2023   10:12 AM 03/08/2023   12:36 PM  Readmission Risk Prevention Plan  Transportation Screening Complete Complete  PCP or Specialist Appt within 3-5 Days Complete Complete  HRI or Home Care Consult Complete Complete  Social Work Consult for Recovery Care Planning/Counseling Complete Complete  Palliative Care Screening Not Applicable Not Applicable  Medication Review [employer]) Complete Complete

## 2023-05-12 NOTE — Progress Notes (Signed)
 RN provided report to Burdette Carolin, Charity fundraiser. Pt escorted via [facility] to [facility].

## 2023-05-12 NOTE — Progress Notes (Signed)
 RN attempted to call [facility] to give report. Unable to leave a voicemail message for [address] (ext 0) mailbox was full. RN will attempt to call back.

## 2023-05-13 DIAGNOSIS — N184 Chronic kidney disease, stage 4 (severe): Secondary | ICD-10-CM | POA: Diagnosis not present

## 2023-05-13 DIAGNOSIS — M86172 Other acute osteomyelitis, left ankle and foot: Secondary | ICD-10-CM | POA: Diagnosis not present

## 2023-05-13 DIAGNOSIS — E039 Hypothyroidism, unspecified: Secondary | ICD-10-CM | POA: Diagnosis not present

## 2023-05-13 DIAGNOSIS — Z952 Presence of prosthetic heart valve: Secondary | ICD-10-CM | POA: Diagnosis not present

## 2023-05-13 DIAGNOSIS — C689 Malignant neoplasm of urinary organ, unspecified: Secondary | ICD-10-CM | POA: Diagnosis not present

## 2023-05-13 DIAGNOSIS — I5042 Chronic combined systolic (congestive) and diastolic (congestive) heart failure: Secondary | ICD-10-CM | POA: Diagnosis not present

## 2023-05-13 DIAGNOSIS — M869 Osteomyelitis, unspecified: Secondary | ICD-10-CM | POA: Diagnosis not present

## 2023-05-13 DIAGNOSIS — I10 Essential (primary) hypertension: Secondary | ICD-10-CM | POA: Diagnosis not present

## 2023-05-13 DIAGNOSIS — I480 Paroxysmal atrial fibrillation: Secondary | ICD-10-CM | POA: Diagnosis not present

## 2023-05-14 ENCOUNTER — Other Ambulatory Visit (HOSPITAL_COMMUNITY): Payer: Self-pay

## 2023-05-16 ENCOUNTER — Inpatient Hospital Stay: Payer: Self-pay | Admitting: Infectious Diseases

## 2023-05-16 DIAGNOSIS — C689 Malignant neoplasm of urinary organ, unspecified: Secondary | ICD-10-CM | POA: Diagnosis not present

## 2023-05-16 DIAGNOSIS — I5042 Chronic combined systolic (congestive) and diastolic (congestive) heart failure: Secondary | ICD-10-CM | POA: Diagnosis not present

## 2023-05-16 DIAGNOSIS — N184 Chronic kidney disease, stage 4 (severe): Secondary | ICD-10-CM | POA: Diagnosis not present

## 2023-05-16 DIAGNOSIS — M869 Osteomyelitis, unspecified: Secondary | ICD-10-CM | POA: Diagnosis not present

## 2023-05-16 DIAGNOSIS — I480 Paroxysmal atrial fibrillation: Secondary | ICD-10-CM | POA: Diagnosis not present

## 2023-05-16 DIAGNOSIS — Z952 Presence of prosthetic heart valve: Secondary | ICD-10-CM | POA: Diagnosis not present

## 2023-05-16 DIAGNOSIS — I10 Essential (primary) hypertension: Secondary | ICD-10-CM | POA: Diagnosis not present

## 2023-05-16 DIAGNOSIS — M86172 Other acute osteomyelitis, left ankle and foot: Secondary | ICD-10-CM | POA: Diagnosis not present

## 2023-05-16 DIAGNOSIS — E039 Hypothyroidism, unspecified: Secondary | ICD-10-CM | POA: Diagnosis not present

## 2023-05-17 DIAGNOSIS — I480 Paroxysmal atrial fibrillation: Secondary | ICD-10-CM | POA: Diagnosis not present

## 2023-05-17 DIAGNOSIS — I10 Essential (primary) hypertension: Secondary | ICD-10-CM | POA: Diagnosis not present

## 2023-05-17 DIAGNOSIS — M86172 Other acute osteomyelitis, left ankle and foot: Secondary | ICD-10-CM | POA: Diagnosis not present

## 2023-05-17 DIAGNOSIS — Z952 Presence of prosthetic heart valve: Secondary | ICD-10-CM | POA: Diagnosis not present

## 2023-05-17 DIAGNOSIS — M869 Osteomyelitis, unspecified: Secondary | ICD-10-CM | POA: Diagnosis not present

## 2023-05-17 DIAGNOSIS — C689 Malignant neoplasm of urinary organ, unspecified: Secondary | ICD-10-CM | POA: Diagnosis not present

## 2023-05-17 DIAGNOSIS — I5042 Chronic combined systolic (congestive) and diastolic (congestive) heart failure: Secondary | ICD-10-CM | POA: Diagnosis not present

## 2023-05-17 DIAGNOSIS — N184 Chronic kidney disease, stage 4 (severe): Secondary | ICD-10-CM | POA: Diagnosis not present

## 2023-05-17 DIAGNOSIS — E039 Hypothyroidism, unspecified: Secondary | ICD-10-CM | POA: Diagnosis not present

## 2023-05-18 DIAGNOSIS — M86172 Other acute osteomyelitis, left ankle and foot: Secondary | ICD-10-CM | POA: Diagnosis not present

## 2023-05-18 DIAGNOSIS — N184 Chronic kidney disease, stage 4 (severe): Secondary | ICD-10-CM | POA: Diagnosis not present

## 2023-05-18 DIAGNOSIS — M869 Osteomyelitis, unspecified: Secondary | ICD-10-CM | POA: Diagnosis not present

## 2023-05-18 DIAGNOSIS — E039 Hypothyroidism, unspecified: Secondary | ICD-10-CM | POA: Diagnosis not present

## 2023-05-18 DIAGNOSIS — Z952 Presence of prosthetic heart valve: Secondary | ICD-10-CM | POA: Diagnosis not present

## 2023-05-18 DIAGNOSIS — I10 Essential (primary) hypertension: Secondary | ICD-10-CM | POA: Diagnosis not present

## 2023-05-18 DIAGNOSIS — C689 Malignant neoplasm of urinary organ, unspecified: Secondary | ICD-10-CM | POA: Diagnosis not present

## 2023-05-18 DIAGNOSIS — I5042 Chronic combined systolic (congestive) and diastolic (congestive) heart failure: Secondary | ICD-10-CM | POA: Diagnosis not present

## 2023-05-18 DIAGNOSIS — I480 Paroxysmal atrial fibrillation: Secondary | ICD-10-CM | POA: Diagnosis not present

## 2023-05-20 DIAGNOSIS — Z952 Presence of prosthetic heart valve: Secondary | ICD-10-CM | POA: Diagnosis not present

## 2023-05-20 DIAGNOSIS — E039 Hypothyroidism, unspecified: Secondary | ICD-10-CM | POA: Diagnosis not present

## 2023-05-20 DIAGNOSIS — I5042 Chronic combined systolic (congestive) and diastolic (congestive) heart failure: Secondary | ICD-10-CM | POA: Diagnosis not present

## 2023-05-20 DIAGNOSIS — I10 Essential (primary) hypertension: Secondary | ICD-10-CM | POA: Diagnosis not present

## 2023-05-20 DIAGNOSIS — C689 Malignant neoplasm of urinary organ, unspecified: Secondary | ICD-10-CM | POA: Diagnosis not present

## 2023-05-20 DIAGNOSIS — N184 Chronic kidney disease, stage 4 (severe): Secondary | ICD-10-CM | POA: Diagnosis not present

## 2023-05-20 DIAGNOSIS — M869 Osteomyelitis, unspecified: Secondary | ICD-10-CM | POA: Diagnosis not present

## 2023-05-20 DIAGNOSIS — M86172 Other acute osteomyelitis, left ankle and foot: Secondary | ICD-10-CM | POA: Diagnosis not present

## 2023-05-20 DIAGNOSIS — I480 Paroxysmal atrial fibrillation: Secondary | ICD-10-CM | POA: Diagnosis not present

## 2023-05-22 DIAGNOSIS — I480 Paroxysmal atrial fibrillation: Secondary | ICD-10-CM | POA: Diagnosis not present

## 2023-05-22 DIAGNOSIS — M869 Osteomyelitis, unspecified: Secondary | ICD-10-CM | POA: Diagnosis not present

## 2023-05-22 DIAGNOSIS — N184 Chronic kidney disease, stage 4 (severe): Secondary | ICD-10-CM | POA: Diagnosis not present

## 2023-05-22 DIAGNOSIS — C689 Malignant neoplasm of urinary organ, unspecified: Secondary | ICD-10-CM | POA: Diagnosis not present

## 2023-05-22 DIAGNOSIS — E039 Hypothyroidism, unspecified: Secondary | ICD-10-CM | POA: Diagnosis not present

## 2023-05-22 DIAGNOSIS — I5042 Chronic combined systolic (congestive) and diastolic (congestive) heart failure: Secondary | ICD-10-CM | POA: Diagnosis not present

## 2023-05-22 DIAGNOSIS — Z952 Presence of prosthetic heart valve: Secondary | ICD-10-CM | POA: Diagnosis not present

## 2023-05-22 DIAGNOSIS — I10 Essential (primary) hypertension: Secondary | ICD-10-CM | POA: Diagnosis not present

## 2023-05-22 DIAGNOSIS — M86172 Other acute osteomyelitis, left ankle and foot: Secondary | ICD-10-CM | POA: Diagnosis not present

## 2023-05-23 DIAGNOSIS — M86172 Other acute osteomyelitis, left ankle and foot: Secondary | ICD-10-CM | POA: Diagnosis not present

## 2023-05-23 DIAGNOSIS — M869 Osteomyelitis, unspecified: Secondary | ICD-10-CM | POA: Diagnosis not present

## 2023-05-23 DIAGNOSIS — I10 Essential (primary) hypertension: Secondary | ICD-10-CM | POA: Diagnosis not present

## 2023-05-23 DIAGNOSIS — C689 Malignant neoplasm of urinary organ, unspecified: Secondary | ICD-10-CM | POA: Diagnosis not present

## 2023-05-23 DIAGNOSIS — Z952 Presence of prosthetic heart valve: Secondary | ICD-10-CM | POA: Diagnosis not present

## 2023-05-23 DIAGNOSIS — I5042 Chronic combined systolic (congestive) and diastolic (congestive) heart failure: Secondary | ICD-10-CM | POA: Diagnosis not present

## 2023-05-23 DIAGNOSIS — I480 Paroxysmal atrial fibrillation: Secondary | ICD-10-CM | POA: Diagnosis not present

## 2023-05-23 DIAGNOSIS — N184 Chronic kidney disease, stage 4 (severe): Secondary | ICD-10-CM | POA: Diagnosis not present

## 2023-05-23 DIAGNOSIS — E039 Hypothyroidism, unspecified: Secondary | ICD-10-CM | POA: Diagnosis not present

## 2023-05-24 ENCOUNTER — Ambulatory Visit: Payer: Medicare PPO

## 2023-05-24 DIAGNOSIS — I70244 Atherosclerosis of native arteries of left leg with ulceration of heel and midfoot: Secondary | ICD-10-CM | POA: Diagnosis not present

## 2023-05-24 DIAGNOSIS — I5042 Chronic combined systolic (congestive) and diastolic (congestive) heart failure: Secondary | ICD-10-CM | POA: Diagnosis not present

## 2023-05-24 DIAGNOSIS — C689 Malignant neoplasm of urinary organ, unspecified: Secondary | ICD-10-CM | POA: Diagnosis not present

## 2023-05-24 DIAGNOSIS — I480 Paroxysmal atrial fibrillation: Secondary | ICD-10-CM | POA: Diagnosis not present

## 2023-05-25 DIAGNOSIS — Z952 Presence of prosthetic heart valve: Secondary | ICD-10-CM | POA: Diagnosis not present

## 2023-05-25 DIAGNOSIS — C689 Malignant neoplasm of urinary organ, unspecified: Secondary | ICD-10-CM | POA: Diagnosis not present

## 2023-05-25 DIAGNOSIS — M86172 Other acute osteomyelitis, left ankle and foot: Secondary | ICD-10-CM | POA: Diagnosis not present

## 2023-05-25 DIAGNOSIS — E039 Hypothyroidism, unspecified: Secondary | ICD-10-CM | POA: Diagnosis not present

## 2023-05-25 DIAGNOSIS — I5042 Chronic combined systolic (congestive) and diastolic (congestive) heart failure: Secondary | ICD-10-CM | POA: Diagnosis not present

## 2023-05-25 DIAGNOSIS — I480 Paroxysmal atrial fibrillation: Secondary | ICD-10-CM | POA: Diagnosis not present

## 2023-05-25 DIAGNOSIS — M869 Osteomyelitis, unspecified: Secondary | ICD-10-CM | POA: Diagnosis not present

## 2023-05-25 DIAGNOSIS — N184 Chronic kidney disease, stage 4 (severe): Secondary | ICD-10-CM | POA: Diagnosis not present

## 2023-05-25 DIAGNOSIS — I10 Essential (primary) hypertension: Secondary | ICD-10-CM | POA: Diagnosis not present

## 2023-05-27 DIAGNOSIS — I5042 Chronic combined systolic (congestive) and diastolic (congestive) heart failure: Secondary | ICD-10-CM | POA: Diagnosis not present

## 2023-05-27 DIAGNOSIS — M86172 Other acute osteomyelitis, left ankle and foot: Secondary | ICD-10-CM | POA: Diagnosis not present

## 2023-05-27 DIAGNOSIS — M869 Osteomyelitis, unspecified: Secondary | ICD-10-CM | POA: Diagnosis not present

## 2023-05-27 DIAGNOSIS — E039 Hypothyroidism, unspecified: Secondary | ICD-10-CM | POA: Diagnosis not present

## 2023-05-27 DIAGNOSIS — C689 Malignant neoplasm of urinary organ, unspecified: Secondary | ICD-10-CM | POA: Diagnosis not present

## 2023-05-27 DIAGNOSIS — N184 Chronic kidney disease, stage 4 (severe): Secondary | ICD-10-CM | POA: Diagnosis not present

## 2023-05-27 DIAGNOSIS — Z952 Presence of prosthetic heart valve: Secondary | ICD-10-CM | POA: Diagnosis not present

## 2023-05-27 DIAGNOSIS — I10 Essential (primary) hypertension: Secondary | ICD-10-CM | POA: Diagnosis not present

## 2023-05-27 DIAGNOSIS — I480 Paroxysmal atrial fibrillation: Secondary | ICD-10-CM | POA: Diagnosis not present

## 2023-05-30 DIAGNOSIS — E039 Hypothyroidism, unspecified: Secondary | ICD-10-CM | POA: Diagnosis not present

## 2023-05-30 DIAGNOSIS — Z952 Presence of prosthetic heart valve: Secondary | ICD-10-CM | POA: Diagnosis not present

## 2023-05-30 DIAGNOSIS — I5042 Chronic combined systolic (congestive) and diastolic (congestive) heart failure: Secondary | ICD-10-CM | POA: Diagnosis not present

## 2023-05-30 DIAGNOSIS — M869 Osteomyelitis, unspecified: Secondary | ICD-10-CM | POA: Diagnosis not present

## 2023-05-30 DIAGNOSIS — M86172 Other acute osteomyelitis, left ankle and foot: Secondary | ICD-10-CM | POA: Diagnosis not present

## 2023-05-30 DIAGNOSIS — N184 Chronic kidney disease, stage 4 (severe): Secondary | ICD-10-CM | POA: Diagnosis not present

## 2023-05-30 DIAGNOSIS — C689 Malignant neoplasm of urinary organ, unspecified: Secondary | ICD-10-CM | POA: Diagnosis not present

## 2023-05-30 DIAGNOSIS — I10 Essential (primary) hypertension: Secondary | ICD-10-CM | POA: Diagnosis not present

## 2023-05-30 DIAGNOSIS — I480 Paroxysmal atrial fibrillation: Secondary | ICD-10-CM | POA: Diagnosis not present

## 2023-05-31 ENCOUNTER — Ambulatory Visit

## 2023-05-31 DIAGNOSIS — C689 Malignant neoplasm of urinary organ, unspecified: Secondary | ICD-10-CM | POA: Diagnosis not present

## 2023-05-31 DIAGNOSIS — M86172 Other acute osteomyelitis, left ankle and foot: Secondary | ICD-10-CM | POA: Diagnosis not present

## 2023-05-31 DIAGNOSIS — I5042 Chronic combined systolic (congestive) and diastolic (congestive) heart failure: Secondary | ICD-10-CM | POA: Diagnosis not present

## 2023-05-31 DIAGNOSIS — M869 Osteomyelitis, unspecified: Secondary | ICD-10-CM | POA: Diagnosis not present

## 2023-05-31 DIAGNOSIS — E039 Hypothyroidism, unspecified: Secondary | ICD-10-CM | POA: Diagnosis not present

## 2023-05-31 DIAGNOSIS — I10 Essential (primary) hypertension: Secondary | ICD-10-CM | POA: Diagnosis not present

## 2023-05-31 DIAGNOSIS — I480 Paroxysmal atrial fibrillation: Secondary | ICD-10-CM | POA: Diagnosis not present

## 2023-05-31 DIAGNOSIS — N184 Chronic kidney disease, stage 4 (severe): Secondary | ICD-10-CM | POA: Diagnosis not present

## 2023-05-31 DIAGNOSIS — Z952 Presence of prosthetic heart valve: Secondary | ICD-10-CM | POA: Diagnosis not present

## 2023-06-01 DIAGNOSIS — N184 Chronic kidney disease, stage 4 (severe): Secondary | ICD-10-CM | POA: Diagnosis not present

## 2023-06-01 DIAGNOSIS — I4892 Unspecified atrial flutter: Secondary | ICD-10-CM | POA: Diagnosis not present

## 2023-06-01 DIAGNOSIS — I480 Paroxysmal atrial fibrillation: Secondary | ICD-10-CM | POA: Diagnosis not present

## 2023-06-01 DIAGNOSIS — M86172 Other acute osteomyelitis, left ankle and foot: Secondary | ICD-10-CM | POA: Diagnosis not present

## 2023-06-01 DIAGNOSIS — Z89422 Acquired absence of other left toe(s): Secondary | ICD-10-CM | POA: Diagnosis not present

## 2023-06-01 DIAGNOSIS — I5042 Chronic combined systolic (congestive) and diastolic (congestive) heart failure: Secondary | ICD-10-CM | POA: Diagnosis not present

## 2023-06-01 DIAGNOSIS — Z4781 Encounter for orthopedic aftercare following surgical amputation: Secondary | ICD-10-CM | POA: Diagnosis not present

## 2023-06-01 DIAGNOSIS — L89892 Pressure ulcer of other site, stage 2: Secondary | ICD-10-CM | POA: Diagnosis not present

## 2023-06-01 DIAGNOSIS — I130 Hypertensive heart and chronic kidney disease with heart failure and stage 1 through stage 4 chronic kidney disease, or unspecified chronic kidney disease: Secondary | ICD-10-CM | POA: Diagnosis not present

## 2023-06-01 LAB — CUP PACEART REMOTE DEVICE CHECK
Battery Remaining Longevity: 144 mo
Battery Remaining Percentage: 100 %
Brady Statistic RA Percent Paced: 11 %
Brady Statistic RV Percent Paced: 99 %
Date Time Interrogation Session: 20250506134900
Implantable Lead Connection Status: 753985
Implantable Lead Connection Status: 753985
Implantable Lead Implant Date: 20230123
Implantable Lead Implant Date: 20230123
Implantable Lead Location: 753859
Implantable Lead Location: 753860
Implantable Lead Model: 7841
Implantable Lead Model: 7842
Implantable Lead Serial Number: 1101059
Implantable Lead Serial Number: 1169597
Implantable Pulse Generator Implant Date: 20230123
Lead Channel Impedance Value: 510 Ohm
Lead Channel Impedance Value: 655 Ohm
Lead Channel Pacing Threshold Amplitude: 0.8 V
Lead Channel Pacing Threshold Amplitude: 1.1 V
Lead Channel Pacing Threshold Pulse Width: 0.4 ms
Lead Channel Pacing Threshold Pulse Width: 0.4 ms
Lead Channel Setting Pacing Amplitude: 2.5 V
Lead Channel Setting Pacing Amplitude: 2.5 V
Lead Channel Setting Pacing Pulse Width: 0.4 ms
Lead Channel Setting Sensing Sensitivity: 2.5 mV
Pulse Gen Serial Number: 561085
Zone Setting Status: 755011

## 2023-06-03 DIAGNOSIS — M86172 Other acute osteomyelitis, left ankle and foot: Secondary | ICD-10-CM | POA: Diagnosis not present

## 2023-06-03 DIAGNOSIS — I5042 Chronic combined systolic (congestive) and diastolic (congestive) heart failure: Secondary | ICD-10-CM | POA: Diagnosis not present

## 2023-06-03 DIAGNOSIS — I4892 Unspecified atrial flutter: Secondary | ICD-10-CM | POA: Diagnosis not present

## 2023-06-03 DIAGNOSIS — I480 Paroxysmal atrial fibrillation: Secondary | ICD-10-CM | POA: Diagnosis not present

## 2023-06-03 DIAGNOSIS — I130 Hypertensive heart and chronic kidney disease with heart failure and stage 1 through stage 4 chronic kidney disease, or unspecified chronic kidney disease: Secondary | ICD-10-CM | POA: Diagnosis not present

## 2023-06-03 DIAGNOSIS — Z89422 Acquired absence of other left toe(s): Secondary | ICD-10-CM | POA: Diagnosis not present

## 2023-06-03 DIAGNOSIS — N184 Chronic kidney disease, stage 4 (severe): Secondary | ICD-10-CM | POA: Diagnosis not present

## 2023-06-03 DIAGNOSIS — L89892 Pressure ulcer of other site, stage 2: Secondary | ICD-10-CM | POA: Diagnosis not present

## 2023-06-03 DIAGNOSIS — Z4781 Encounter for orthopedic aftercare following surgical amputation: Secondary | ICD-10-CM | POA: Diagnosis not present

## 2023-06-04 ENCOUNTER — Other Ambulatory Visit (HOSPITAL_COMMUNITY): Payer: Self-pay

## 2023-06-05 ENCOUNTER — Encounter: Payer: Self-pay | Admitting: Cardiology

## 2023-06-06 ENCOUNTER — Other Ambulatory Visit (HOSPITAL_COMMUNITY): Payer: Self-pay

## 2023-06-06 ENCOUNTER — Other Ambulatory Visit: Payer: Self-pay

## 2023-06-06 DIAGNOSIS — L89892 Pressure ulcer of other site, stage 2: Secondary | ICD-10-CM | POA: Diagnosis not present

## 2023-06-06 DIAGNOSIS — Z4781 Encounter for orthopedic aftercare following surgical amputation: Secondary | ICD-10-CM | POA: Diagnosis not present

## 2023-06-06 DIAGNOSIS — N184 Chronic kidney disease, stage 4 (severe): Secondary | ICD-10-CM | POA: Diagnosis not present

## 2023-06-06 DIAGNOSIS — I739 Peripheral vascular disease, unspecified: Secondary | ICD-10-CM

## 2023-06-06 DIAGNOSIS — I4892 Unspecified atrial flutter: Secondary | ICD-10-CM | POA: Diagnosis not present

## 2023-06-06 DIAGNOSIS — M86172 Other acute osteomyelitis, left ankle and foot: Secondary | ICD-10-CM | POA: Diagnosis not present

## 2023-06-06 DIAGNOSIS — I480 Paroxysmal atrial fibrillation: Secondary | ICD-10-CM | POA: Diagnosis not present

## 2023-06-06 DIAGNOSIS — I5042 Chronic combined systolic (congestive) and diastolic (congestive) heart failure: Secondary | ICD-10-CM | POA: Diagnosis not present

## 2023-06-06 DIAGNOSIS — Z89422 Acquired absence of other left toe(s): Secondary | ICD-10-CM | POA: Diagnosis not present

## 2023-06-06 DIAGNOSIS — I130 Hypertensive heart and chronic kidney disease with heart failure and stage 1 through stage 4 chronic kidney disease, or unspecified chronic kidney disease: Secondary | ICD-10-CM | POA: Diagnosis not present

## 2023-06-08 DIAGNOSIS — H25813 Combined forms of age-related cataract, bilateral: Secondary | ICD-10-CM | POA: Diagnosis not present

## 2023-06-08 DIAGNOSIS — H524 Presbyopia: Secondary | ICD-10-CM | POA: Diagnosis not present

## 2023-06-08 DIAGNOSIS — L89892 Pressure ulcer of other site, stage 2: Secondary | ICD-10-CM | POA: Diagnosis not present

## 2023-06-08 DIAGNOSIS — N184 Chronic kidney disease, stage 4 (severe): Secondary | ICD-10-CM | POA: Diagnosis not present

## 2023-06-08 DIAGNOSIS — Z89422 Acquired absence of other left toe(s): Secondary | ICD-10-CM | POA: Diagnosis not present

## 2023-06-08 DIAGNOSIS — H40013 Open angle with borderline findings, low risk, bilateral: Secondary | ICD-10-CM | POA: Diagnosis not present

## 2023-06-08 DIAGNOSIS — Z4781 Encounter for orthopedic aftercare following surgical amputation: Secondary | ICD-10-CM | POA: Diagnosis not present

## 2023-06-08 DIAGNOSIS — M86172 Other acute osteomyelitis, left ankle and foot: Secondary | ICD-10-CM | POA: Diagnosis not present

## 2023-06-08 DIAGNOSIS — I4892 Unspecified atrial flutter: Secondary | ICD-10-CM | POA: Diagnosis not present

## 2023-06-08 DIAGNOSIS — I480 Paroxysmal atrial fibrillation: Secondary | ICD-10-CM | POA: Diagnosis not present

## 2023-06-08 DIAGNOSIS — I130 Hypertensive heart and chronic kidney disease with heart failure and stage 1 through stage 4 chronic kidney disease, or unspecified chronic kidney disease: Secondary | ICD-10-CM | POA: Diagnosis not present

## 2023-06-08 DIAGNOSIS — H353132 Nonexudative age-related macular degeneration, bilateral, intermediate dry stage: Secondary | ICD-10-CM | POA: Diagnosis not present

## 2023-06-08 DIAGNOSIS — I5042 Chronic combined systolic (congestive) and diastolic (congestive) heart failure: Secondary | ICD-10-CM | POA: Diagnosis not present

## 2023-06-09 DIAGNOSIS — M86172 Other acute osteomyelitis, left ankle and foot: Secondary | ICD-10-CM | POA: Diagnosis not present

## 2023-06-09 DIAGNOSIS — Z4781 Encounter for orthopedic aftercare following surgical amputation: Secondary | ICD-10-CM | POA: Diagnosis not present

## 2023-06-09 DIAGNOSIS — N184 Chronic kidney disease, stage 4 (severe): Secondary | ICD-10-CM | POA: Diagnosis not present

## 2023-06-09 DIAGNOSIS — I4892 Unspecified atrial flutter: Secondary | ICD-10-CM | POA: Diagnosis not present

## 2023-06-09 DIAGNOSIS — I130 Hypertensive heart and chronic kidney disease with heart failure and stage 1 through stage 4 chronic kidney disease, or unspecified chronic kidney disease: Secondary | ICD-10-CM | POA: Diagnosis not present

## 2023-06-09 DIAGNOSIS — I5042 Chronic combined systolic (congestive) and diastolic (congestive) heart failure: Secondary | ICD-10-CM | POA: Diagnosis not present

## 2023-06-09 DIAGNOSIS — Z89422 Acquired absence of other left toe(s): Secondary | ICD-10-CM | POA: Diagnosis not present

## 2023-06-09 DIAGNOSIS — I480 Paroxysmal atrial fibrillation: Secondary | ICD-10-CM | POA: Diagnosis not present

## 2023-06-09 DIAGNOSIS — L89892 Pressure ulcer of other site, stage 2: Secondary | ICD-10-CM | POA: Diagnosis not present

## 2023-06-13 ENCOUNTER — Other Ambulatory Visit (HOSPITAL_COMMUNITY): Payer: Self-pay

## 2023-06-13 ENCOUNTER — Ambulatory Visit: Payer: Self-pay | Admitting: Infectious Disease

## 2023-06-13 DIAGNOSIS — H35033 Hypertensive retinopathy, bilateral: Secondary | ICD-10-CM | POA: Diagnosis not present

## 2023-06-13 DIAGNOSIS — H43393 Other vitreous opacities, bilateral: Secondary | ICD-10-CM | POA: Diagnosis not present

## 2023-06-13 DIAGNOSIS — H25813 Combined forms of age-related cataract, bilateral: Secondary | ICD-10-CM | POA: Diagnosis not present

## 2023-06-13 DIAGNOSIS — H31092 Other chorioretinal scars, left eye: Secondary | ICD-10-CM | POA: Diagnosis not present

## 2023-06-13 DIAGNOSIS — H353122 Nonexudative age-related macular degeneration, left eye, intermediate dry stage: Secondary | ICD-10-CM | POA: Diagnosis not present

## 2023-06-13 DIAGNOSIS — D3131 Benign neoplasm of right choroid: Secondary | ICD-10-CM | POA: Diagnosis not present

## 2023-06-13 DIAGNOSIS — H353211 Exudative age-related macular degeneration, right eye, with active choroidal neovascularization: Secondary | ICD-10-CM | POA: Diagnosis not present

## 2023-06-13 DIAGNOSIS — H43813 Vitreous degeneration, bilateral: Secondary | ICD-10-CM | POA: Diagnosis not present

## 2023-06-14 DIAGNOSIS — I5042 Chronic combined systolic (congestive) and diastolic (congestive) heart failure: Secondary | ICD-10-CM | POA: Diagnosis not present

## 2023-06-14 DIAGNOSIS — Z4781 Encounter for orthopedic aftercare following surgical amputation: Secondary | ICD-10-CM | POA: Diagnosis not present

## 2023-06-14 DIAGNOSIS — N184 Chronic kidney disease, stage 4 (severe): Secondary | ICD-10-CM | POA: Diagnosis not present

## 2023-06-14 DIAGNOSIS — I130 Hypertensive heart and chronic kidney disease with heart failure and stage 1 through stage 4 chronic kidney disease, or unspecified chronic kidney disease: Secondary | ICD-10-CM | POA: Diagnosis not present

## 2023-06-14 DIAGNOSIS — I480 Paroxysmal atrial fibrillation: Secondary | ICD-10-CM | POA: Diagnosis not present

## 2023-06-14 DIAGNOSIS — L89892 Pressure ulcer of other site, stage 2: Secondary | ICD-10-CM | POA: Diagnosis not present

## 2023-06-14 DIAGNOSIS — M86172 Other acute osteomyelitis, left ankle and foot: Secondary | ICD-10-CM | POA: Diagnosis not present

## 2023-06-14 DIAGNOSIS — Z89422 Acquired absence of other left toe(s): Secondary | ICD-10-CM | POA: Diagnosis not present

## 2023-06-14 DIAGNOSIS — I4892 Unspecified atrial flutter: Secondary | ICD-10-CM | POA: Diagnosis not present

## 2023-06-15 DIAGNOSIS — M86172 Other acute osteomyelitis, left ankle and foot: Secondary | ICD-10-CM | POA: Diagnosis not present

## 2023-06-15 DIAGNOSIS — Z4781 Encounter for orthopedic aftercare following surgical amputation: Secondary | ICD-10-CM | POA: Diagnosis not present

## 2023-06-15 DIAGNOSIS — L89892 Pressure ulcer of other site, stage 2: Secondary | ICD-10-CM | POA: Diagnosis not present

## 2023-06-15 DIAGNOSIS — I130 Hypertensive heart and chronic kidney disease with heart failure and stage 1 through stage 4 chronic kidney disease, or unspecified chronic kidney disease: Secondary | ICD-10-CM | POA: Diagnosis not present

## 2023-06-15 DIAGNOSIS — I4892 Unspecified atrial flutter: Secondary | ICD-10-CM | POA: Diagnosis not present

## 2023-06-15 DIAGNOSIS — I480 Paroxysmal atrial fibrillation: Secondary | ICD-10-CM | POA: Diagnosis not present

## 2023-06-15 DIAGNOSIS — N184 Chronic kidney disease, stage 4 (severe): Secondary | ICD-10-CM | POA: Diagnosis not present

## 2023-06-15 DIAGNOSIS — I5042 Chronic combined systolic (congestive) and diastolic (congestive) heart failure: Secondary | ICD-10-CM | POA: Diagnosis not present

## 2023-06-15 DIAGNOSIS — Z89422 Acquired absence of other left toe(s): Secondary | ICD-10-CM | POA: Diagnosis not present

## 2023-06-16 ENCOUNTER — Encounter: Payer: Self-pay | Admitting: Vascular Surgery

## 2023-06-16 ENCOUNTER — Ambulatory Visit (HOSPITAL_COMMUNITY)
Admission: RE | Admit: 2023-06-16 | Discharge: 2023-06-16 | Disposition: A | Discharge status: Home / Self Care | Source: Ambulatory Visit | Attending: Vascular Surgery | Admitting: Vascular Surgery

## 2023-06-16 ENCOUNTER — Ambulatory Visit (INDEPENDENT_AMBULATORY_CARE_PROVIDER_SITE_OTHER): Admitting: Vascular Surgery

## 2023-06-16 VITALS — BP 127/70 | HR 76 | Temp 98.0°F | Ht 73.0 in | Wt 184.7 lb

## 2023-06-16 DIAGNOSIS — I739 Peripheral vascular disease, unspecified: Secondary | ICD-10-CM | POA: Diagnosis not present

## 2023-06-16 DIAGNOSIS — Z89422 Acquired absence of other left toe(s): Secondary | ICD-10-CM | POA: Diagnosis not present

## 2023-06-16 LAB — VAS US ABI WITH/WO TBI: Right ABI: 1.23

## 2023-06-16 NOTE — Progress Notes (Signed)
 Office Note     CC: Bilateral lower extremity wounds Requesting Provider:  Suan Elm, MD  HPI: Jordan Oconnell is a 83 y.o. (January 02, 1941) male presenting at the request of .Suan Elm, MD for evaluation of bilateral lower extremity wounds.  Comorbidities are outlined below.  I was called by hospitalist at [facility] regarding Jordan Oconnell, and recent surgery involving the left foot, specifically fourth toe amputation for osteomyelitis.  Recent ABI demonstrated triphasic waveforms with toe pressure of 92.  Hospital medicine stated the patient had a palpable pulse in the foot, therefore I elected to see him in the outpatient setting.  On exam, Jordan Oconnell was doing well.  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  Now retired, he continues to live an active lifestyle, walking roughly a mile a day and half mile increments.  Previous right sided fourth toe amputation has healed, left-sided toe amputation has nearly-healed.  He continues to have a superficial ulceration on the lateral aspect of the left foot which is healing per his nurse who visits on a weekly basis.  He denies symptoms of claudication, ischemic rest pain.   Past Medical History:  Diagnosis Date   Bladder cancer (HCC)    BPH (benign prostatic hyperplasia)    CKD (chronic kidney disease), stage IV (HCC)    Colon polyps    Diverticulosis    GERD (gastroesophageal reflux disease)    Hyperlipidemia    pt denies   Hypertension    Osteomyelitis of left foot (HCC) 04/26/2023   PAF (paroxysmal atrial fibrillation) (HCC)    on Flecanide and diltiazem . 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 Arlester Ladd and Dr. Sherene Dilling   Severe aortic stenosis 10/05/2020   Toe pain, left 04/15/2023    Past  Surgical History:  Procedure Laterality Date   AMPUTATION TOE Right 03/02/2023   Procedure: AMPUTATION TOE, 4th;  Surgeon: Ali Ink, MD;  Location: [facility];  Service: Orthopedics;  Laterality: Right;   AMPUTATION TOE Left 05/04/2023   Procedure: AMPUTATION, TOE;  Surgeon: Ali Ink, MD;  Location: [facility];  Service: Orthopedics;  Laterality: Left;  left foot 4th toe metatarsalphalangeal joint disarticulation   APPENDECTOMY     BLADDER SURGERY  01/25/2006   transurethral resection/resection of prostatic urethra   BOWEL RESECTION     CARDIOVERSION N/A 03/03/2022   Procedure: CARDIOVERSION;  Surgeon: Sheryle Donning, MD;  Location: [facility] ENDOSCOPY;  Service: Cardiovascular;  Laterality: N/A;   CARDIOVERSION N/A 03/12/2022   Procedure: CARDIOVERSION;  Surgeon: Jerryl Morin, DO;  Location: [facility] ENDOSCOPY;  Service: Cardiovascular;  Laterality: N/A;   ESOPHAGOGASTRODUODENOSCOPY N/A 07/24/2013   Procedure: ESOPHAGOGASTRODUODENOSCOPY (EGD);  Surgeon: Tobin Forts, MD;  Location: [facility];  Service: Endoscopy;  Laterality: N/A;   INTRAOPERATIVE TRANSTHORACIC ECHOCARDIOGRAM N/A 01/27/2021   Procedure: INTRAOPERATIVE TRANSTHORACIC ECHOCARDIOGRAM;  Surgeon: Arnoldo Lapping, MD;  Location: [facility] INVASIVE CV LAB;  Service: Open Heart Surgery;  Laterality: N/A;   IR FLUORO GUIDE CV LINE RIGHT  03/04/2023   IR REMOVAL TUN CV CATH W/O FL  05/09/2023   IR US  GUIDE VASC ACCESS RIGHT  03/04/2023   laparoscopic surgery  01/25/2010   laser   (?)   NEPHRECTOMY  01/25/2005   partial, left  NEPHROSTOMY  01/25/2009   stent   NM MYOCAR PERF WALL MOTION  09/08/2010   Normal   PACEMAKER IMPLANT N/A 02/12/2021   Procedure: PACEMAKER IMPLANT;  Surgeon: Boyce Byes, MD;  Location: [facility] INVASIVE CV LAB;  Service: Cardiovascular;  Laterality: N/A;   RIGHT HEART CATH AND CORONARY ANGIOGRAPHY N/A 10/06/2020   Procedure: RIGHT HEART CATH AND CORONARY ANGIOGRAPHY;  Surgeon: Arnoldo Lapping, MD;   Location: [facility] INVASIVE CV LAB;  Service: Cardiovascular;  Laterality: N/A;   ROBOT ASSISTED LAPAROSCOPIC COMPLETE CYSTECT ILEAL CONDUIT     TRANSCATHETER AORTIC VALVE REPLACEMENT, TRANSFEMORAL Right 01/27/2021   Procedure: TRANSCATHETER AORTIC VALVE REPLACEMENT, TRANSFEMORAL;  Surgeon: Arnoldo Lapping, MD;  Location: [facility] INVASIVE CV LAB;  Service: Open Heart Surgery;  Laterality: Right;   US  ECHOCARDIOGRAPHY  09/08/2010   mild diastolic dysfunction,mild dilated LA,mild MR,AI,mildly dilated aortic root    Social History   Socioeconomic History   Marital status: Divorced    Spouse name: Not on file   Number of children: 2   Years of education: Not on file   Highest education level: Not on file  Occupational History   Occupation: Runner, broadcasting/film/video  Tobacco Use   Smoking status: Never   Smokeless tobacco: Never  Vaping Use   Vaping status: Never Used  Substance and Sexual Activity   Alcohol  use: Not Currently    Comment: occasionally   Drug use: No   Sexual activity: Not on file  Other Topics Concern   Not on file  Social History Narrative   Not on file   Social Drivers of Health   Financial Resource Strain: Not on file  Food Insecurity: No Food Insecurity (05/03/2023)   Hunger Vital Sign    Worried About Running Out of Food in the Last Year: Never true    Ran Out of Food in the Last Year: Never true  Transportation Needs: No Transportation Needs (05/03/2023)   PRAPARE - Administrator, Civil Service (Medical): No    Lack of Transportation (Non-Medical): No  Physical Activity: Not on file  Stress: Not on file  Social Connections: Unknown (05/03/2023)   Social Connection and Isolation Panel [NHANES]    Frequency of Communication with Friends and Family: Twice a week    Frequency of Social Gatherings with Friends and Family: Twice a week    Attends Religious Services: Patient declined    Database administrator or Organizations: No    Attends Engineer, structural: Never     Marital Status: Patient declined  Recent Concern: Social Connections - Moderately Isolated (03/12/2023)   Social Connection and Isolation Panel [NHANES]    Frequency of Communication with Friends and Family: More than three times a week    Frequency of Social Gatherings with Friends and Family: Once a week    Attends Religious Services: Never    Database administrator or Organizations: No    Attends Banker Meetings: Never    Marital Status: Living with partner  Intimate Partner Violence: Not At Risk (05/03/2023)   Humiliation, Afraid, Rape, and Kick questionnaire    Fear of Current or Ex-Partner: No    Emotionally Abused: No    Physically Abused: No    Sexually Abused: No   Family History  Problem Relation Age of Onset   Ovarian cancer Mother    Colon cancer Mother    Colon cancer Father    Crohn's disease Son    Breast cancer Daughter  Colon cancer Brother     Current Outpatient Medications  Medication Sig Dispense Refill   acetaminophen  (TYLENOL ) 500 MG tablet Take 1,000 mg by mouth See admin instructions. Take 1,000 mg by mouth in the morning and evening     allopurinol  (ZYLOPRIM ) 100 MG tablet Take 1 tablet (100 mg total) by mouth daily. 30 tablet 0   ALPRAZolam  (XANAX ) 0.25 MG tablet Take 1 tablet (0.25 mg total) by mouth at bedtime. 30 tablet 0   amiodarone  (PACERONE ) 200 MG tablet Take 1 tablet by mouth once daily 90 tablet 1   amLODipine  (NORVASC ) 10 MG tablet Take 1 tablet (10 mg total) by mouth daily. 90 tablet 0   aspirin  EC 81 MG tablet Take 1 tablet (81 mg total) by mouth daily. Swallow whole. 90 tablet 0   atorvastatin  (LIPITOR) 10 MG tablet Take 1 tablet (10 mg total) by mouth daily. (Patient taking differently: Take 10 mg by mouth at bedtime.) 90 tablet 0   bisacodyl  (DULCOLAX) 5 MG EC tablet Take 1 tablet (5 mg total) by mouth daily. Hold for 24 hours if more than 2 loose bowel movements in one day. 30 tablet 0   carvedilol  (COREG ) 12.5 MG tablet  Take 12.5 mg by mouth 2 (two) times daily.     cefadroxil  (DURICEF) 500 MG capsule Take 1 capsule (500 mg total) by mouth 2 (two) times daily. 14 capsule 0   cyanocobalamin  1000 MCG tablet Take 1 tablet (1,000 mcg total) by mouth daily. 90 tablet 0   docusate sodium  (COLACE) 100 MG capsule Take 1 capsule (100 mg total) by mouth daily. 10 capsule 0   ELIQUIS  2.5 MG TABS tablet Take 1 tablet (2.5 mg total) by mouth 2 (two) times daily. 60 tablet 5   feeding supplement (ENSURE ENLIVE / ENSURE PLUS) LIQD Take 237 mLs by mouth 2 (two) times daily between meals. 237 mL 12   hydrALAZINE  (APRESOLINE ) 25 MG tablet Take 1 tablet (25 mg total) by mouth 2 (two) times daily. 180 tablet 0   isosorbide  mononitrate (IMDUR ) 30 MG 24 hr tablet Take 1 tablet (30 mg total) by mouth daily. 90 tablet 0   levothyroxine  (SYNTHROID ) 75 MCG tablet Take 75 mcg by mouth daily before breakfast.     linezolid  (ZYVOX ) 600 MG tablet Take 1 tablet (600 mg total) by mouth every 12 (twelve) hours. 14 tablet 0   mirtazapine  (REMERON ) 15 MG tablet Take 7.5 mg by mouth at bedtime.     nutrition supplement, JUVEN, (JUVEN) PACK Take 1 packet by mouth 2 (two) times daily with a meal. 60 packet 0   oxyCODONE  (OXY IR/ROXICODONE ) 5 MG immediate release tablet Take 1 tablet (5 mg total) by mouth every 6 (six) hours as needed for moderate pain (pain score 4-6). 20 tablet 0   sodium bicarbonate  650 MG tablet Take 1,300 mg by mouth See admin instructions. Take 1,300 mg by mouth in the morning and evening     No current facility-administered medications for this visit.    Allergies  Allergen Reactions   Amoxicillin Other (See Comments)    Acute interstitial nephritis   Doxycycline Hives   Flomax [Tamsulosin Hcl] Other (See Comments)    Dizziness     REVIEW OF SYSTEMS:  [X]  denotes positive finding, [ ]  denotes negative finding Cardiac  Comments:  Chest pain or chest pressure:    Shortness of breath upon exertion:    Short of breath  when lying flat:    Irregular heart  rhythm:        Vascular    Pain in calf, thigh, or hip brought on by ambulation:    Pain in feet at night that wakes you up from your sleep:     Blood clot in your veins:    Leg swelling:         Pulmonary    Oxygen at home:    Productive cough:     Wheezing:         Neurologic    Sudden weakness in arms or legs:     Sudden numbness in arms or legs:     Sudden onset of difficulty speaking or slurred speech:    Temporary loss of vision in one eye:     Problems with dizziness:         Gastrointestinal    Blood in stool:     Vomited blood:         Genitourinary    Burning when urinating:     Blood in urine:        Psychiatric    Major depression:         Hematologic    Bleeding problems:    Problems with blood clotting too easily:        Skin    Rashes or ulcers:        Constitutional    Fever or chills:      PHYSICAL EXAMINATION:  Vitals:   06/16/23 1350  BP: 127/70  Pulse: 76  Temp: 98 F (36.7 C)  SpO2: 96%  Weight: 184 lb 11.2 oz (83.8 kg)  Height: 6\' 1"  (1.854 m)    General:  WDWN in NAD; vital signs documented above Gait: Not observed HENT: WNL, normocephalic Pulmonary: normal non-labored breathing , without wheezing Cardiac: regular HR Abdomen: soft, NT, no masses Skin: without rashes Vascular Exam/Pulses:  Right Left  Radial 2+ (normal) 2+ (normal)  Ulnar    Femoral    Popliteal    DP 2+ (normal) 2+ (normal)  PT     Extremities: without ischemic changes, without Gangrene , without cellulitis; without open wounds;  Musculoskeletal: no muscle wasting or atrophy  Neurologic: A&O X 3;  No focal weakness or paresthesias are detected Psychiatric:  The pt has Normal affect.   Non-Invasive Vascular Imaging:   ABI Findings:  +---------+------------------+-----+----------+--------+  Right   Rt Pressure (mmHg)IndexWaveform  Comment   +---------+------------------+-----+----------+--------+   Brachial 144                                        +---------+------------------+-----+----------+--------+  ATA     184               1.23 triphasic           +---------+------------------+-----+----------+--------+  PTA     120               0.80 monophasic          +---------+------------------+-----+----------+--------+  Great Toe76                0.51                     +---------+------------------+-----+----------+--------+   +---------+------------------+-----+----------+-------+  Left    Lt Pressure (mmHg)IndexWaveform  Comment  +---------+------------------+-----+----------+-------+  Brachial 150                                       +---------+------------------+-----+----------+-------+  ATA                            triphasic          +---------+------------------+-----+----------+-------+  PTA                            monophasic         +---------+------------------+-----+----------+-------+  Great Toe93                0.62                    +---------+------------------+-----+----------+-------+   +-------+---------------+-----------+---------------+------------+  ABI/TBIToday's ABI    Today's TBIPrevious ABI   Previous TBI  +-------+---------------+-----------+---------------+------------+  Right 1.23           0.51       Noncompressible0.34          +-------+---------------+-----------+---------------+------------+  Left  Noncompressible0.62       Noncompressible0.56          +-------+---------------+-----------+---------------+------------+     ASSESSMENT/PLAN: Jordan Oconnell is a 83 y.o. male presenting with history of bilateral fourth toe amputation, most recently left fourth toe and a lateral foot wound on the left foot.  Both are healing nicely.  I had a long conversation with Aariz regarding the above, most notably his peripheral arterial disease.  He continues to have an  excellent toe pressure bilaterally and palpable dorsalis pedis pulses bilaterally.  I do not think intervention is needed at this time as he continues to heal.  We discussed the importance of daily podiatric care.  I asked him to call me should wound healing stagnate or cease, as we would move forward with angiography in an effort to define and improve distal perfusion for wound healing.   At this time, my plan is to see him in 1 year. He is medically optimized.   Kayla Part, MD [facility] [phone]

## 2023-06-20 DIAGNOSIS — Z4781 Encounter for orthopedic aftercare following surgical amputation: Secondary | ICD-10-CM | POA: Diagnosis not present

## 2023-06-20 DIAGNOSIS — I4892 Unspecified atrial flutter: Secondary | ICD-10-CM | POA: Diagnosis not present

## 2023-06-20 DIAGNOSIS — I130 Hypertensive heart and chronic kidney disease with heart failure and stage 1 through stage 4 chronic kidney disease, or unspecified chronic kidney disease: Secondary | ICD-10-CM | POA: Diagnosis not present

## 2023-06-20 DIAGNOSIS — Z89422 Acquired absence of other left toe(s): Secondary | ICD-10-CM | POA: Diagnosis not present

## 2023-06-20 DIAGNOSIS — I5042 Chronic combined systolic (congestive) and diastolic (congestive) heart failure: Secondary | ICD-10-CM | POA: Diagnosis not present

## 2023-06-20 DIAGNOSIS — M86172 Other acute osteomyelitis, left ankle and foot: Secondary | ICD-10-CM | POA: Diagnosis not present

## 2023-06-20 DIAGNOSIS — N184 Chronic kidney disease, stage 4 (severe): Secondary | ICD-10-CM | POA: Diagnosis not present

## 2023-06-20 DIAGNOSIS — I480 Paroxysmal atrial fibrillation: Secondary | ICD-10-CM | POA: Diagnosis not present

## 2023-06-20 DIAGNOSIS — L89892 Pressure ulcer of other site, stage 2: Secondary | ICD-10-CM | POA: Diagnosis not present

## 2023-06-21 DIAGNOSIS — Z4781 Encounter for orthopedic aftercare following surgical amputation: Secondary | ICD-10-CM | POA: Diagnosis not present

## 2023-06-21 DIAGNOSIS — L89892 Pressure ulcer of other site, stage 2: Secondary | ICD-10-CM | POA: Diagnosis not present

## 2023-06-21 DIAGNOSIS — I5042 Chronic combined systolic (congestive) and diastolic (congestive) heart failure: Secondary | ICD-10-CM | POA: Diagnosis not present

## 2023-06-21 DIAGNOSIS — I4892 Unspecified atrial flutter: Secondary | ICD-10-CM | POA: Diagnosis not present

## 2023-06-21 DIAGNOSIS — N184 Chronic kidney disease, stage 4 (severe): Secondary | ICD-10-CM | POA: Diagnosis not present

## 2023-06-21 DIAGNOSIS — M86172 Other acute osteomyelitis, left ankle and foot: Secondary | ICD-10-CM | POA: Diagnosis not present

## 2023-06-21 DIAGNOSIS — I130 Hypertensive heart and chronic kidney disease with heart failure and stage 1 through stage 4 chronic kidney disease, or unspecified chronic kidney disease: Secondary | ICD-10-CM | POA: Diagnosis not present

## 2023-06-21 DIAGNOSIS — Z89422 Acquired absence of other left toe(s): Secondary | ICD-10-CM | POA: Diagnosis not present

## 2023-06-21 DIAGNOSIS — I480 Paroxysmal atrial fibrillation: Secondary | ICD-10-CM | POA: Diagnosis not present

## 2023-06-22 DIAGNOSIS — C679 Malignant neoplasm of bladder, unspecified: Secondary | ICD-10-CM | POA: Diagnosis not present

## 2023-06-22 DIAGNOSIS — I480 Paroxysmal atrial fibrillation: Secondary | ICD-10-CM | POA: Diagnosis not present

## 2023-06-22 DIAGNOSIS — C649 Malignant neoplasm of unspecified kidney, except renal pelvis: Secondary | ICD-10-CM | POA: Diagnosis not present

## 2023-06-22 DIAGNOSIS — M869 Osteomyelitis, unspecified: Secondary | ICD-10-CM | POA: Diagnosis not present

## 2023-06-22 DIAGNOSIS — I5189 Other ill-defined heart diseases: Secondary | ICD-10-CM | POA: Diagnosis not present

## 2023-06-22 DIAGNOSIS — N184 Chronic kidney disease, stage 4 (severe): Secondary | ICD-10-CM | POA: Diagnosis not present

## 2023-06-22 DIAGNOSIS — K5909 Other constipation: Secondary | ICD-10-CM | POA: Diagnosis not present

## 2023-06-22 DIAGNOSIS — I739 Peripheral vascular disease, unspecified: Secondary | ICD-10-CM | POA: Diagnosis not present

## 2023-06-22 DIAGNOSIS — I129 Hypertensive chronic kidney disease with stage 1 through stage 4 chronic kidney disease, or unspecified chronic kidney disease: Secondary | ICD-10-CM | POA: Diagnosis not present

## 2023-06-22 DIAGNOSIS — E039 Hypothyroidism, unspecified: Secondary | ICD-10-CM | POA: Diagnosis not present

## 2023-06-23 DIAGNOSIS — M205X1 Other deformities of toe(s) (acquired), right foot: Secondary | ICD-10-CM | POA: Diagnosis not present

## 2023-06-23 DIAGNOSIS — B351 Tinea unguium: Secondary | ICD-10-CM | POA: Diagnosis not present

## 2023-06-23 DIAGNOSIS — I70203 Unspecified atherosclerosis of native arteries of extremities, bilateral legs: Secondary | ICD-10-CM | POA: Diagnosis not present

## 2023-06-23 DIAGNOSIS — M205X2 Other deformities of toe(s) (acquired), left foot: Secondary | ICD-10-CM | POA: Diagnosis not present

## 2023-06-27 DIAGNOSIS — I130 Hypertensive heart and chronic kidney disease with heart failure and stage 1 through stage 4 chronic kidney disease, or unspecified chronic kidney disease: Secondary | ICD-10-CM | POA: Diagnosis not present

## 2023-06-27 DIAGNOSIS — I5042 Chronic combined systolic (congestive) and diastolic (congestive) heart failure: Secondary | ICD-10-CM | POA: Diagnosis not present

## 2023-06-27 DIAGNOSIS — I480 Paroxysmal atrial fibrillation: Secondary | ICD-10-CM | POA: Diagnosis not present

## 2023-06-27 DIAGNOSIS — Z4781 Encounter for orthopedic aftercare following surgical amputation: Secondary | ICD-10-CM | POA: Diagnosis not present

## 2023-06-27 DIAGNOSIS — Z89422 Acquired absence of other left toe(s): Secondary | ICD-10-CM | POA: Diagnosis not present

## 2023-06-27 DIAGNOSIS — I4892 Unspecified atrial flutter: Secondary | ICD-10-CM | POA: Diagnosis not present

## 2023-06-27 DIAGNOSIS — M86172 Other acute osteomyelitis, left ankle and foot: Secondary | ICD-10-CM | POA: Diagnosis not present

## 2023-06-27 DIAGNOSIS — L89892 Pressure ulcer of other site, stage 2: Secondary | ICD-10-CM | POA: Diagnosis not present

## 2023-06-27 DIAGNOSIS — N184 Chronic kidney disease, stage 4 (severe): Secondary | ICD-10-CM | POA: Diagnosis not present

## 2023-06-28 DIAGNOSIS — I480 Paroxysmal atrial fibrillation: Secondary | ICD-10-CM | POA: Diagnosis not present

## 2023-06-28 DIAGNOSIS — N184 Chronic kidney disease, stage 4 (severe): Secondary | ICD-10-CM | POA: Diagnosis not present

## 2023-06-28 DIAGNOSIS — Z89422 Acquired absence of other left toe(s): Secondary | ICD-10-CM | POA: Diagnosis not present

## 2023-06-28 DIAGNOSIS — I5042 Chronic combined systolic (congestive) and diastolic (congestive) heart failure: Secondary | ICD-10-CM | POA: Diagnosis not present

## 2023-06-28 DIAGNOSIS — M86172 Other acute osteomyelitis, left ankle and foot: Secondary | ICD-10-CM | POA: Diagnosis not present

## 2023-06-28 DIAGNOSIS — L89892 Pressure ulcer of other site, stage 2: Secondary | ICD-10-CM | POA: Diagnosis not present

## 2023-06-28 DIAGNOSIS — I4892 Unspecified atrial flutter: Secondary | ICD-10-CM | POA: Diagnosis not present

## 2023-06-28 DIAGNOSIS — I130 Hypertensive heart and chronic kidney disease with heart failure and stage 1 through stage 4 chronic kidney disease, or unspecified chronic kidney disease: Secondary | ICD-10-CM | POA: Diagnosis not present

## 2023-06-28 DIAGNOSIS — Z4781 Encounter for orthopedic aftercare following surgical amputation: Secondary | ICD-10-CM | POA: Diagnosis not present

## 2023-07-01 DIAGNOSIS — H2513 Age-related nuclear cataract, bilateral: Secondary | ICD-10-CM | POA: Diagnosis not present

## 2023-07-04 DIAGNOSIS — I480 Paroxysmal atrial fibrillation: Secondary | ICD-10-CM | POA: Diagnosis not present

## 2023-07-04 DIAGNOSIS — N184 Chronic kidney disease, stage 4 (severe): Secondary | ICD-10-CM | POA: Diagnosis not present

## 2023-07-04 DIAGNOSIS — I4892 Unspecified atrial flutter: Secondary | ICD-10-CM | POA: Diagnosis not present

## 2023-07-04 DIAGNOSIS — I5042 Chronic combined systolic (congestive) and diastolic (congestive) heart failure: Secondary | ICD-10-CM | POA: Diagnosis not present

## 2023-07-04 DIAGNOSIS — M86172 Other acute osteomyelitis, left ankle and foot: Secondary | ICD-10-CM | POA: Diagnosis not present

## 2023-07-04 DIAGNOSIS — I130 Hypertensive heart and chronic kidney disease with heart failure and stage 1 through stage 4 chronic kidney disease, or unspecified chronic kidney disease: Secondary | ICD-10-CM | POA: Diagnosis not present

## 2023-07-04 DIAGNOSIS — Z4781 Encounter for orthopedic aftercare following surgical amputation: Secondary | ICD-10-CM | POA: Diagnosis not present

## 2023-07-04 DIAGNOSIS — L89892 Pressure ulcer of other site, stage 2: Secondary | ICD-10-CM | POA: Diagnosis not present

## 2023-07-04 DIAGNOSIS — Z89422 Acquired absence of other left toe(s): Secondary | ICD-10-CM | POA: Diagnosis not present

## 2023-07-06 DIAGNOSIS — I130 Hypertensive heart and chronic kidney disease with heart failure and stage 1 through stage 4 chronic kidney disease, or unspecified chronic kidney disease: Secondary | ICD-10-CM | POA: Diagnosis not present

## 2023-07-06 DIAGNOSIS — N184 Chronic kidney disease, stage 4 (severe): Secondary | ICD-10-CM | POA: Diagnosis not present

## 2023-07-06 DIAGNOSIS — I480 Paroxysmal atrial fibrillation: Secondary | ICD-10-CM | POA: Diagnosis not present

## 2023-07-06 DIAGNOSIS — Z4781 Encounter for orthopedic aftercare following surgical amputation: Secondary | ICD-10-CM | POA: Diagnosis not present

## 2023-07-06 DIAGNOSIS — Z89422 Acquired absence of other left toe(s): Secondary | ICD-10-CM | POA: Diagnosis not present

## 2023-07-06 DIAGNOSIS — I5042 Chronic combined systolic (congestive) and diastolic (congestive) heart failure: Secondary | ICD-10-CM | POA: Diagnosis not present

## 2023-07-06 DIAGNOSIS — M86172 Other acute osteomyelitis, left ankle and foot: Secondary | ICD-10-CM | POA: Diagnosis not present

## 2023-07-06 DIAGNOSIS — L89892 Pressure ulcer of other site, stage 2: Secondary | ICD-10-CM | POA: Diagnosis not present

## 2023-07-06 DIAGNOSIS — I4892 Unspecified atrial flutter: Secondary | ICD-10-CM | POA: Diagnosis not present

## 2023-07-11 DIAGNOSIS — H6123 Impacted cerumen, bilateral: Secondary | ICD-10-CM | POA: Diagnosis not present

## 2023-07-13 DIAGNOSIS — I480 Paroxysmal atrial fibrillation: Secondary | ICD-10-CM | POA: Diagnosis not present

## 2023-07-13 DIAGNOSIS — I4892 Unspecified atrial flutter: Secondary | ICD-10-CM | POA: Diagnosis not present

## 2023-07-13 DIAGNOSIS — N184 Chronic kidney disease, stage 4 (severe): Secondary | ICD-10-CM | POA: Diagnosis not present

## 2023-07-13 DIAGNOSIS — Z4781 Encounter for orthopedic aftercare following surgical amputation: Secondary | ICD-10-CM | POA: Diagnosis not present

## 2023-07-13 DIAGNOSIS — I130 Hypertensive heart and chronic kidney disease with heart failure and stage 1 through stage 4 chronic kidney disease, or unspecified chronic kidney disease: Secondary | ICD-10-CM | POA: Diagnosis not present

## 2023-07-13 DIAGNOSIS — M86172 Other acute osteomyelitis, left ankle and foot: Secondary | ICD-10-CM | POA: Diagnosis not present

## 2023-07-13 DIAGNOSIS — L89892 Pressure ulcer of other site, stage 2: Secondary | ICD-10-CM | POA: Diagnosis not present

## 2023-07-13 DIAGNOSIS — I5042 Chronic combined systolic (congestive) and diastolic (congestive) heart failure: Secondary | ICD-10-CM | POA: Diagnosis not present

## 2023-07-13 DIAGNOSIS — Z89422 Acquired absence of other left toe(s): Secondary | ICD-10-CM | POA: Diagnosis not present

## 2023-07-21 DIAGNOSIS — Z85828 Personal history of other malignant neoplasm of skin: Secondary | ICD-10-CM | POA: Diagnosis not present

## 2023-07-21 DIAGNOSIS — L821 Other seborrheic keratosis: Secondary | ICD-10-CM | POA: Diagnosis not present

## 2023-07-21 DIAGNOSIS — L820 Inflamed seborrheic keratosis: Secondary | ICD-10-CM | POA: Diagnosis not present

## 2023-07-21 DIAGNOSIS — D692 Other nonthrombocytopenic purpura: Secondary | ICD-10-CM | POA: Diagnosis not present

## 2023-07-21 DIAGNOSIS — L858 Other specified epidermal thickening: Secondary | ICD-10-CM | POA: Diagnosis not present

## 2023-07-25 DIAGNOSIS — I130 Hypertensive heart and chronic kidney disease with heart failure and stage 1 through stage 4 chronic kidney disease, or unspecified chronic kidney disease: Secondary | ICD-10-CM | POA: Diagnosis not present

## 2023-07-25 DIAGNOSIS — Z4781 Encounter for orthopedic aftercare following surgical amputation: Secondary | ICD-10-CM | POA: Diagnosis not present

## 2023-07-25 DIAGNOSIS — I480 Paroxysmal atrial fibrillation: Secondary | ICD-10-CM | POA: Diagnosis not present

## 2023-07-25 DIAGNOSIS — Z89422 Acquired absence of other left toe(s): Secondary | ICD-10-CM | POA: Diagnosis not present

## 2023-07-25 DIAGNOSIS — I4892 Unspecified atrial flutter: Secondary | ICD-10-CM | POA: Diagnosis not present

## 2023-07-25 DIAGNOSIS — L89892 Pressure ulcer of other site, stage 2: Secondary | ICD-10-CM | POA: Diagnosis not present

## 2023-07-25 DIAGNOSIS — N184 Chronic kidney disease, stage 4 (severe): Secondary | ICD-10-CM | POA: Diagnosis not present

## 2023-07-25 DIAGNOSIS — M86172 Other acute osteomyelitis, left ankle and foot: Secondary | ICD-10-CM | POA: Diagnosis not present

## 2023-07-25 DIAGNOSIS — I5042 Chronic combined systolic (congestive) and diastolic (congestive) heart failure: Secondary | ICD-10-CM | POA: Diagnosis not present

## 2023-07-26 DIAGNOSIS — C662 Malignant neoplasm of left ureter: Secondary | ICD-10-CM | POA: Diagnosis not present

## 2023-07-26 DIAGNOSIS — C678 Malignant neoplasm of overlapping sites of bladder: Secondary | ICD-10-CM | POA: Diagnosis not present

## 2023-07-26 DIAGNOSIS — E039 Hypothyroidism, unspecified: Secondary | ICD-10-CM | POA: Diagnosis not present

## 2023-07-26 DIAGNOSIS — C642 Malignant neoplasm of left kidney, except renal pelvis: Secondary | ICD-10-CM | POA: Diagnosis not present

## 2023-07-26 DIAGNOSIS — N184 Chronic kidney disease, stage 4 (severe): Secondary | ICD-10-CM | POA: Diagnosis not present

## 2023-07-26 DIAGNOSIS — I129 Hypertensive chronic kidney disease with stage 1 through stage 4 chronic kidney disease, or unspecified chronic kidney disease: Secondary | ICD-10-CM | POA: Diagnosis not present

## 2023-07-26 DIAGNOSIS — C661 Malignant neoplasm of right ureter: Secondary | ICD-10-CM | POA: Diagnosis not present

## 2023-07-26 DIAGNOSIS — N189 Chronic kidney disease, unspecified: Secondary | ICD-10-CM | POA: Diagnosis not present

## 2023-07-26 DIAGNOSIS — I4891 Unspecified atrial fibrillation: Secondary | ICD-10-CM | POA: Diagnosis not present

## 2023-07-28 DIAGNOSIS — I5042 Chronic combined systolic (congestive) and diastolic (congestive) heart failure: Secondary | ICD-10-CM | POA: Diagnosis not present

## 2023-07-28 DIAGNOSIS — Z4781 Encounter for orthopedic aftercare following surgical amputation: Secondary | ICD-10-CM | POA: Diagnosis not present

## 2023-07-28 DIAGNOSIS — M86172 Other acute osteomyelitis, left ankle and foot: Secondary | ICD-10-CM | POA: Diagnosis not present

## 2023-07-28 DIAGNOSIS — I130 Hypertensive heart and chronic kidney disease with heart failure and stage 1 through stage 4 chronic kidney disease, or unspecified chronic kidney disease: Secondary | ICD-10-CM | POA: Diagnosis not present

## 2023-07-28 DIAGNOSIS — I480 Paroxysmal atrial fibrillation: Secondary | ICD-10-CM | POA: Diagnosis not present

## 2023-07-28 DIAGNOSIS — I4892 Unspecified atrial flutter: Secondary | ICD-10-CM | POA: Diagnosis not present

## 2023-07-28 DIAGNOSIS — N184 Chronic kidney disease, stage 4 (severe): Secondary | ICD-10-CM | POA: Diagnosis not present

## 2023-07-28 DIAGNOSIS — Z89422 Acquired absence of other left toe(s): Secondary | ICD-10-CM | POA: Diagnosis not present

## 2023-07-28 DIAGNOSIS — L89892 Pressure ulcer of other site, stage 2: Secondary | ICD-10-CM | POA: Diagnosis not present

## 2023-08-05 DIAGNOSIS — L03115 Cellulitis of right lower limb: Secondary | ICD-10-CM | POA: Diagnosis not present

## 2023-08-05 DIAGNOSIS — L03116 Cellulitis of left lower limb: Secondary | ICD-10-CM | POA: Diagnosis not present

## 2023-08-09 DIAGNOSIS — Z936 Other artificial openings of urinary tract status: Secondary | ICD-10-CM | POA: Diagnosis not present

## 2023-08-18 DIAGNOSIS — Z961 Presence of intraocular lens: Secondary | ICD-10-CM | POA: Diagnosis not present

## 2023-08-18 DIAGNOSIS — H2511 Age-related nuclear cataract, right eye: Secondary | ICD-10-CM | POA: Diagnosis not present

## 2023-08-23 ENCOUNTER — Ambulatory Visit: Payer: Medicare PPO

## 2023-08-25 DIAGNOSIS — H2512 Age-related nuclear cataract, left eye: Secondary | ICD-10-CM | POA: Diagnosis not present

## 2023-08-25 DIAGNOSIS — Z961 Presence of intraocular lens: Secondary | ICD-10-CM | POA: Diagnosis not present

## 2023-08-30 ENCOUNTER — Ambulatory Visit (INDEPENDENT_AMBULATORY_CARE_PROVIDER_SITE_OTHER)

## 2023-08-30 DIAGNOSIS — I495 Sick sinus syndrome: Secondary | ICD-10-CM | POA: Diagnosis not present

## 2023-08-31 ENCOUNTER — Ambulatory Visit: Payer: Self-pay | Admitting: Cardiology

## 2023-08-31 LAB — CUP PACEART REMOTE DEVICE CHECK
Battery Remaining Longevity: 138 mo
Battery Remaining Percentage: 100 %
Brady Statistic RA Percent Paced: 12 %
Brady Statistic RV Percent Paced: 99 %
Date Time Interrogation Session: 20250805060000
Implantable Lead Connection Status: 753985
Implantable Lead Connection Status: 753985
Implantable Lead Implant Date: 20230123
Implantable Lead Implant Date: 20230123
Implantable Lead Location: 753859
Implantable Lead Location: 753860
Implantable Lead Model: 7841
Implantable Lead Model: 7842
Implantable Lead Serial Number: 1101059
Implantable Lead Serial Number: 1169597
Implantable Pulse Generator Implant Date: 20230123
Lead Channel Impedance Value: 505 Ohm
Lead Channel Impedance Value: 656 Ohm
Lead Channel Pacing Threshold Amplitude: 0.6 V
Lead Channel Pacing Threshold Amplitude: 1.1 V
Lead Channel Pacing Threshold Pulse Width: 0.4 ms
Lead Channel Pacing Threshold Pulse Width: 0.4 ms
Lead Channel Setting Pacing Amplitude: 2.5 V
Lead Channel Setting Pacing Amplitude: 2.5 V
Lead Channel Setting Pacing Pulse Width: 0.4 ms
Lead Channel Setting Sensing Sensitivity: 2.5 mV
Pulse Gen Serial Number: 561085
Zone Setting Status: 755011

## 2023-09-02 DIAGNOSIS — E039 Hypothyroidism, unspecified: Secondary | ICD-10-CM | POA: Diagnosis not present

## 2023-09-07 ENCOUNTER — Encounter (HOSPITAL_BASED_OUTPATIENT_CLINIC_OR_DEPARTMENT_OTHER): Attending: Internal Medicine | Admitting: Internal Medicine

## 2023-09-07 DIAGNOSIS — L84 Corns and callosities: Secondary | ICD-10-CM | POA: Insufficient documentation

## 2023-09-07 DIAGNOSIS — Z7901 Long term (current) use of anticoagulants: Secondary | ICD-10-CM | POA: Diagnosis not present

## 2023-09-07 DIAGNOSIS — L97522 Non-pressure chronic ulcer of other part of left foot with fat layer exposed: Secondary | ICD-10-CM | POA: Diagnosis not present

## 2023-09-07 DIAGNOSIS — L97528 Non-pressure chronic ulcer of other part of left foot with other specified severity: Secondary | ICD-10-CM | POA: Diagnosis not present

## 2023-09-07 DIAGNOSIS — I4891 Unspecified atrial fibrillation: Secondary | ICD-10-CM | POA: Diagnosis not present

## 2023-09-09 DIAGNOSIS — S91312A Laceration without foreign body, left foot, initial encounter: Secondary | ICD-10-CM | POA: Diagnosis not present

## 2023-09-09 DIAGNOSIS — L97528 Non-pressure chronic ulcer of other part of left foot with other specified severity: Secondary | ICD-10-CM | POA: Diagnosis not present

## 2023-09-12 DIAGNOSIS — L84 Corns and callosities: Secondary | ICD-10-CM | POA: Diagnosis not present

## 2023-09-12 DIAGNOSIS — L97528 Non-pressure chronic ulcer of other part of left foot with other specified severity: Secondary | ICD-10-CM | POA: Diagnosis not present

## 2023-09-12 DIAGNOSIS — I739 Peripheral vascular disease, unspecified: Secondary | ICD-10-CM | POA: Diagnosis not present

## 2023-09-13 DIAGNOSIS — E785 Hyperlipidemia, unspecified: Secondary | ICD-10-CM | POA: Diagnosis not present

## 2023-09-13 DIAGNOSIS — I5042 Chronic combined systolic (congestive) and diastolic (congestive) heart failure: Secondary | ICD-10-CM | POA: Diagnosis not present

## 2023-09-13 DIAGNOSIS — L97522 Non-pressure chronic ulcer of other part of left foot with fat layer exposed: Secondary | ICD-10-CM | POA: Diagnosis not present

## 2023-09-13 DIAGNOSIS — I2510 Atherosclerotic heart disease of native coronary artery without angina pectoris: Secondary | ICD-10-CM | POA: Diagnosis not present

## 2023-09-13 DIAGNOSIS — I480 Paroxysmal atrial fibrillation: Secondary | ICD-10-CM | POA: Diagnosis not present

## 2023-09-13 DIAGNOSIS — I252 Old myocardial infarction: Secondary | ICD-10-CM | POA: Diagnosis not present

## 2023-09-13 DIAGNOSIS — I4892 Unspecified atrial flutter: Secondary | ICD-10-CM | POA: Diagnosis not present

## 2023-09-13 DIAGNOSIS — I130 Hypertensive heart and chronic kidney disease with heart failure and stage 1 through stage 4 chronic kidney disease, or unspecified chronic kidney disease: Secondary | ICD-10-CM | POA: Diagnosis not present

## 2023-09-13 DIAGNOSIS — N184 Chronic kidney disease, stage 4 (severe): Secondary | ICD-10-CM | POA: Diagnosis not present

## 2023-09-14 ENCOUNTER — Encounter (HOSPITAL_BASED_OUTPATIENT_CLINIC_OR_DEPARTMENT_OTHER): Admitting: Internal Medicine

## 2023-09-14 DIAGNOSIS — L84 Corns and callosities: Secondary | ICD-10-CM | POA: Diagnosis not present

## 2023-09-14 DIAGNOSIS — I4891 Unspecified atrial fibrillation: Secondary | ICD-10-CM | POA: Diagnosis not present

## 2023-09-14 DIAGNOSIS — L97528 Non-pressure chronic ulcer of other part of left foot with other specified severity: Secondary | ICD-10-CM | POA: Diagnosis not present

## 2023-09-14 DIAGNOSIS — Z7901 Long term (current) use of anticoagulants: Secondary | ICD-10-CM | POA: Diagnosis not present

## 2023-09-15 ENCOUNTER — Other Ambulatory Visit: Payer: Self-pay | Admitting: Cardiovascular Disease

## 2023-09-15 DIAGNOSIS — I480 Paroxysmal atrial fibrillation: Secondary | ICD-10-CM

## 2023-09-15 NOTE — Telephone Encounter (Signed)
 Prescription refill request for Eliquis  received. Indication: Afib  Last office visit: 10/06/22 ([facility])  Scr: 2.7 (07/26/23)  Age: 83 Weight: 83.8kg  Appropriate dose. Refill sent.

## 2023-09-16 DIAGNOSIS — L97522 Non-pressure chronic ulcer of other part of left foot with fat layer exposed: Secondary | ICD-10-CM | POA: Diagnosis not present

## 2023-09-16 DIAGNOSIS — I2510 Atherosclerotic heart disease of native coronary artery without angina pectoris: Secondary | ICD-10-CM | POA: Diagnosis not present

## 2023-09-16 DIAGNOSIS — I4892 Unspecified atrial flutter: Secondary | ICD-10-CM | POA: Diagnosis not present

## 2023-09-16 DIAGNOSIS — N184 Chronic kidney disease, stage 4 (severe): Secondary | ICD-10-CM | POA: Diagnosis not present

## 2023-09-16 DIAGNOSIS — E785 Hyperlipidemia, unspecified: Secondary | ICD-10-CM | POA: Diagnosis not present

## 2023-09-16 DIAGNOSIS — I130 Hypertensive heart and chronic kidney disease with heart failure and stage 1 through stage 4 chronic kidney disease, or unspecified chronic kidney disease: Secondary | ICD-10-CM | POA: Diagnosis not present

## 2023-09-16 DIAGNOSIS — I252 Old myocardial infarction: Secondary | ICD-10-CM | POA: Diagnosis not present

## 2023-09-16 DIAGNOSIS — I5042 Chronic combined systolic (congestive) and diastolic (congestive) heart failure: Secondary | ICD-10-CM | POA: Diagnosis not present

## 2023-09-16 DIAGNOSIS — I480 Paroxysmal atrial fibrillation: Secondary | ICD-10-CM | POA: Diagnosis not present

## 2023-09-20 DIAGNOSIS — E785 Hyperlipidemia, unspecified: Secondary | ICD-10-CM | POA: Diagnosis not present

## 2023-09-20 DIAGNOSIS — I130 Hypertensive heart and chronic kidney disease with heart failure and stage 1 through stage 4 chronic kidney disease, or unspecified chronic kidney disease: Secondary | ICD-10-CM | POA: Diagnosis not present

## 2023-09-20 DIAGNOSIS — I480 Paroxysmal atrial fibrillation: Secondary | ICD-10-CM | POA: Diagnosis not present

## 2023-09-20 DIAGNOSIS — I2510 Atherosclerotic heart disease of native coronary artery without angina pectoris: Secondary | ICD-10-CM | POA: Diagnosis not present

## 2023-09-20 DIAGNOSIS — I4892 Unspecified atrial flutter: Secondary | ICD-10-CM | POA: Diagnosis not present

## 2023-09-20 DIAGNOSIS — N184 Chronic kidney disease, stage 4 (severe): Secondary | ICD-10-CM | POA: Diagnosis not present

## 2023-09-20 DIAGNOSIS — I252 Old myocardial infarction: Secondary | ICD-10-CM | POA: Diagnosis not present

## 2023-09-20 DIAGNOSIS — L97522 Non-pressure chronic ulcer of other part of left foot with fat layer exposed: Secondary | ICD-10-CM | POA: Diagnosis not present

## 2023-09-20 DIAGNOSIS — I5042 Chronic combined systolic (congestive) and diastolic (congestive) heart failure: Secondary | ICD-10-CM | POA: Diagnosis not present

## 2023-09-21 DIAGNOSIS — M205X1 Other deformities of toe(s) (acquired), right foot: Secondary | ICD-10-CM | POA: Diagnosis not present

## 2023-09-21 DIAGNOSIS — I70203 Unspecified atherosclerosis of native arteries of extremities, bilateral legs: Secondary | ICD-10-CM | POA: Diagnosis not present

## 2023-09-21 DIAGNOSIS — M205X2 Other deformities of toe(s) (acquired), left foot: Secondary | ICD-10-CM | POA: Diagnosis not present

## 2023-09-21 DIAGNOSIS — B351 Tinea unguium: Secondary | ICD-10-CM | POA: Diagnosis not present

## 2023-09-23 DIAGNOSIS — H353211 Exudative age-related macular degeneration, right eye, with active choroidal neovascularization: Secondary | ICD-10-CM | POA: Diagnosis not present

## 2023-09-23 DIAGNOSIS — Z961 Presence of intraocular lens: Secondary | ICD-10-CM | POA: Diagnosis not present

## 2023-09-23 DIAGNOSIS — H353121 Nonexudative age-related macular degeneration, left eye, early dry stage: Secondary | ICD-10-CM | POA: Diagnosis not present

## 2023-09-27 DIAGNOSIS — I4892 Unspecified atrial flutter: Secondary | ICD-10-CM | POA: Diagnosis not present

## 2023-09-27 DIAGNOSIS — N184 Chronic kidney disease, stage 4 (severe): Secondary | ICD-10-CM | POA: Diagnosis not present

## 2023-09-27 DIAGNOSIS — I5042 Chronic combined systolic (congestive) and diastolic (congestive) heart failure: Secondary | ICD-10-CM | POA: Diagnosis not present

## 2023-09-27 DIAGNOSIS — I252 Old myocardial infarction: Secondary | ICD-10-CM | POA: Diagnosis not present

## 2023-09-27 DIAGNOSIS — I2510 Atherosclerotic heart disease of native coronary artery without angina pectoris: Secondary | ICD-10-CM | POA: Diagnosis not present

## 2023-09-27 DIAGNOSIS — I480 Paroxysmal atrial fibrillation: Secondary | ICD-10-CM | POA: Diagnosis not present

## 2023-09-27 DIAGNOSIS — E785 Hyperlipidemia, unspecified: Secondary | ICD-10-CM | POA: Diagnosis not present

## 2023-09-27 DIAGNOSIS — I130 Hypertensive heart and chronic kidney disease with heart failure and stage 1 through stage 4 chronic kidney disease, or unspecified chronic kidney disease: Secondary | ICD-10-CM | POA: Diagnosis not present

## 2023-09-27 DIAGNOSIS — L97522 Non-pressure chronic ulcer of other part of left foot with fat layer exposed: Secondary | ICD-10-CM | POA: Diagnosis not present

## 2023-09-28 ENCOUNTER — Encounter (HOSPITAL_BASED_OUTPATIENT_CLINIC_OR_DEPARTMENT_OTHER): Attending: Internal Medicine | Admitting: Internal Medicine

## 2023-09-28 DIAGNOSIS — I4891 Unspecified atrial fibrillation: Secondary | ICD-10-CM | POA: Insufficient documentation

## 2023-09-28 DIAGNOSIS — Z905 Acquired absence of kidney: Secondary | ICD-10-CM | POA: Insufficient documentation

## 2023-09-28 DIAGNOSIS — I5042 Chronic combined systolic (congestive) and diastolic (congestive) heart failure: Secondary | ICD-10-CM | POA: Diagnosis not present

## 2023-09-28 DIAGNOSIS — Z8559 Personal history of malignant neoplasm of other urinary tract organ: Secondary | ICD-10-CM | POA: Diagnosis not present

## 2023-09-28 DIAGNOSIS — L97528 Non-pressure chronic ulcer of other part of left foot with other specified severity: Secondary | ICD-10-CM | POA: Insufficient documentation

## 2023-09-28 DIAGNOSIS — Z950 Presence of cardiac pacemaker: Secondary | ICD-10-CM | POA: Diagnosis not present

## 2023-09-28 DIAGNOSIS — Z7901 Long term (current) use of anticoagulants: Secondary | ICD-10-CM | POA: Diagnosis not present

## 2023-09-28 DIAGNOSIS — L84 Corns and callosities: Secondary | ICD-10-CM | POA: Diagnosis not present

## 2023-10-04 DIAGNOSIS — I2510 Atherosclerotic heart disease of native coronary artery without angina pectoris: Secondary | ICD-10-CM | POA: Diagnosis not present

## 2023-10-04 DIAGNOSIS — L97522 Non-pressure chronic ulcer of other part of left foot with fat layer exposed: Secondary | ICD-10-CM | POA: Diagnosis not present

## 2023-10-04 DIAGNOSIS — E785 Hyperlipidemia, unspecified: Secondary | ICD-10-CM | POA: Diagnosis not present

## 2023-10-04 DIAGNOSIS — I252 Old myocardial infarction: Secondary | ICD-10-CM | POA: Diagnosis not present

## 2023-10-04 DIAGNOSIS — I5042 Chronic combined systolic (congestive) and diastolic (congestive) heart failure: Secondary | ICD-10-CM | POA: Diagnosis not present

## 2023-10-04 DIAGNOSIS — I130 Hypertensive heart and chronic kidney disease with heart failure and stage 1 through stage 4 chronic kidney disease, or unspecified chronic kidney disease: Secondary | ICD-10-CM | POA: Diagnosis not present

## 2023-10-04 DIAGNOSIS — I480 Paroxysmal atrial fibrillation: Secondary | ICD-10-CM | POA: Diagnosis not present

## 2023-10-04 DIAGNOSIS — I4892 Unspecified atrial flutter: Secondary | ICD-10-CM | POA: Diagnosis not present

## 2023-10-04 DIAGNOSIS — N184 Chronic kidney disease, stage 4 (severe): Secondary | ICD-10-CM | POA: Diagnosis not present

## 2023-10-04 IMAGING — CT CT HEART MORP W/ CTA COR W/ SCORE W/ CA W/CM &/OR W/O CM
2 of 8 series · 10 of 20 positions shown, 12 images · IV contrast (APPLIED)
Comparison: Chest CT 09/11/2013.
COMPARISON: Chest CT 09/11/2013.

Addendum:
EXAM:
OVER-READ INTERPRETATION  CT CHEST

The following report is an over-read performed by radiologist Dr.
Bi Lel Orsini [REDACTED] on 11/20/2020. This
over-read does not include interpretation of cardiac or coronary
anatomy or pathology. The coronary calcium score/coronary CTA
interpretation by the cardiologist is attached.
CLINICAL DATA: Severe Aortic Stenosis.
Cardiac TAVR CT
TECHNIQUE: A non-contrast, gated CT scan was obtained with axial slices of 3 mm
through the heart for aortic valve calcium scoring. A 100 kV
retrospective, gated, contrast cardiac scan was obtained. Gantry
rotation speed was 250 msecs and collimation was 0.6 mm.
Nitroglycerin was not given. The 3D data set was reconstructed in 5%
intervals of the 0-95% of the R-R cycle. Systolic and diastolic
phases were analyzed on a dedicated workstation using MPR, MIP, and
VRT modes. The patient received 100 cc of contrast.

[Series 10: 0-90% · axial · 0.43mm/px · z∈[+1314,+1460]mm · 5 of 3625 slices shown, 7 images]
[im 605/3625  vessel]
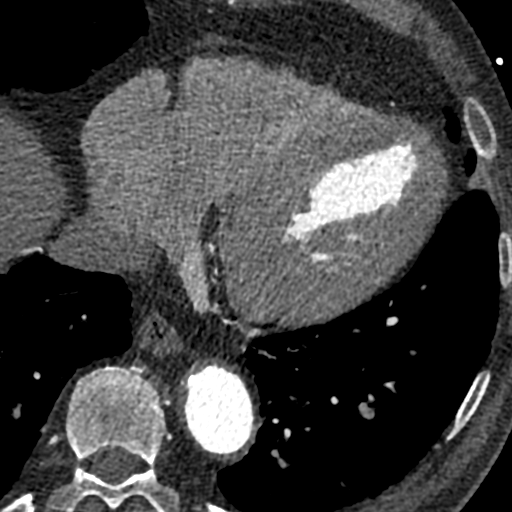
[im 605/3625  lung]
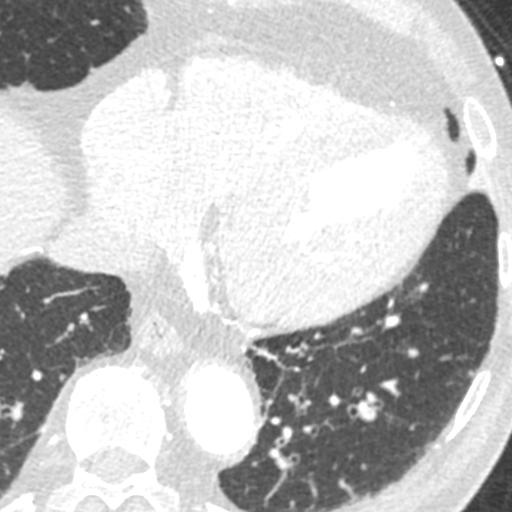
[im 1209/3625  vessel]
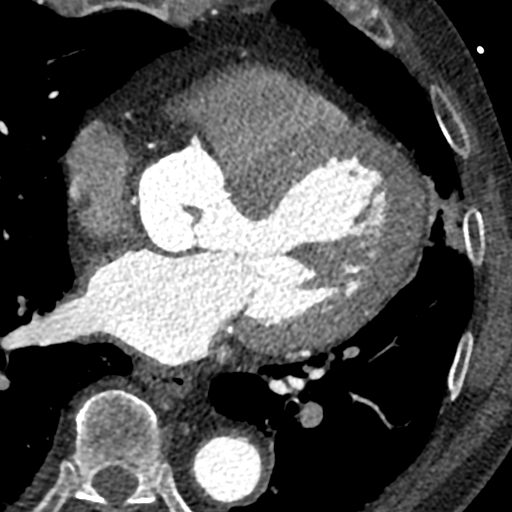
[im 1813/3625  vessel]
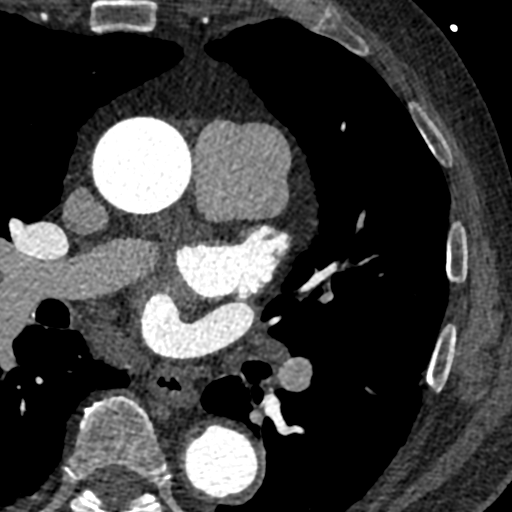
[im 2417/3625  vessel]
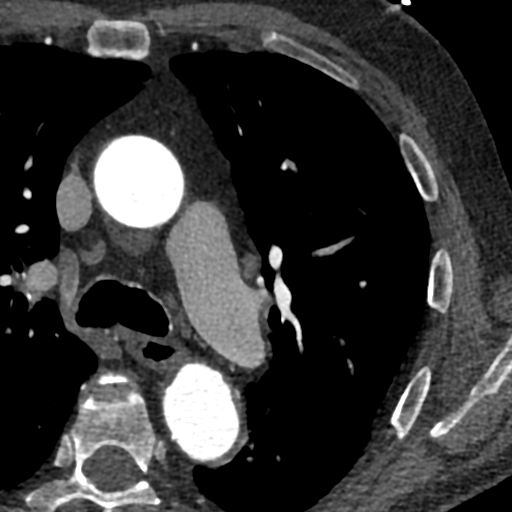
[im 3021/3625  vessel]
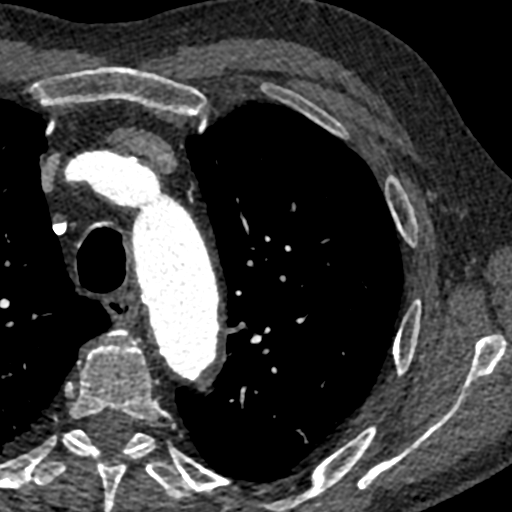
[im 3021/3625  lung]
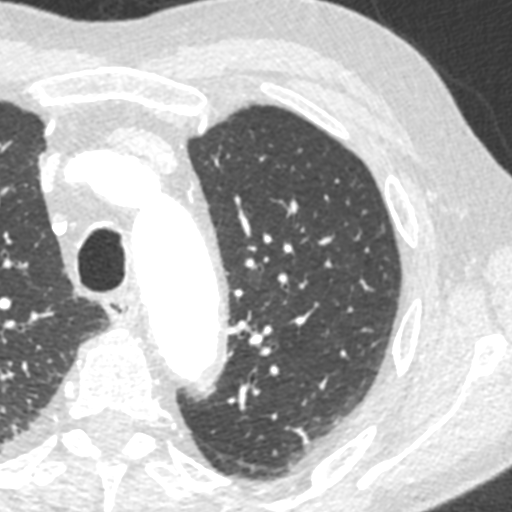

[Series 11: 5-95% · axial · 0.43mm/px · z∈[+1314,+1460]mm · 5 of 3630 slices shown]
[im 605/3630  vessel]
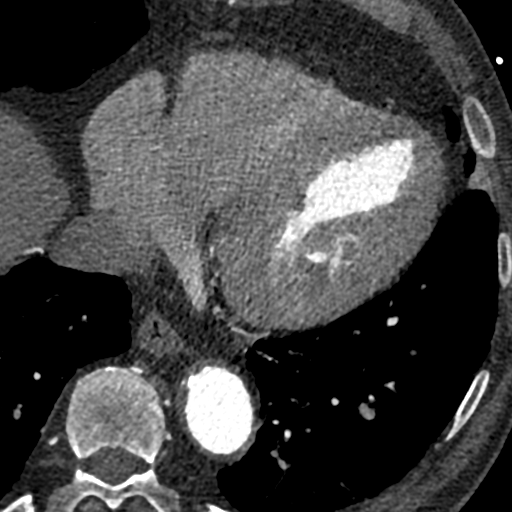
[im 1210/3630  vessel]
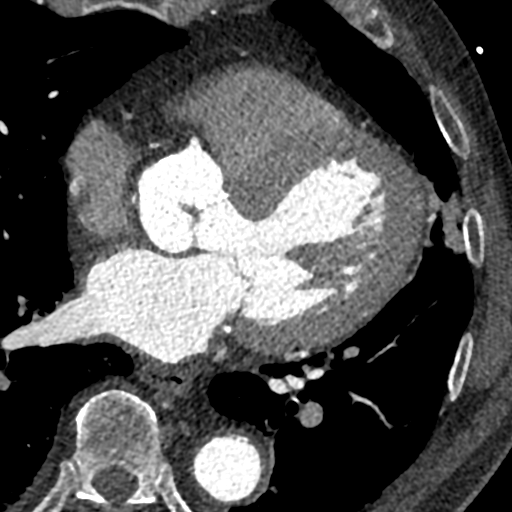
[im 1815/3630  vessel]
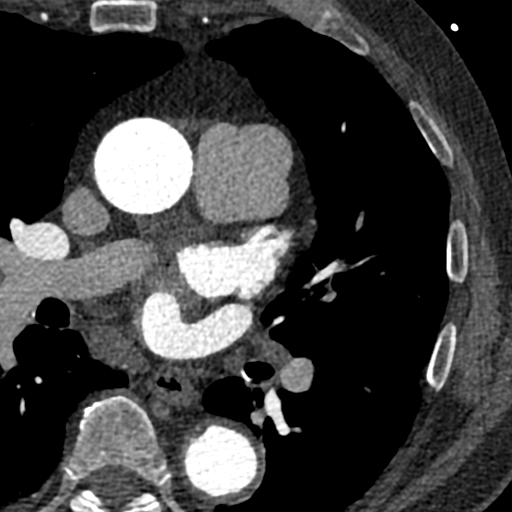
[im 2420/3630  vessel]
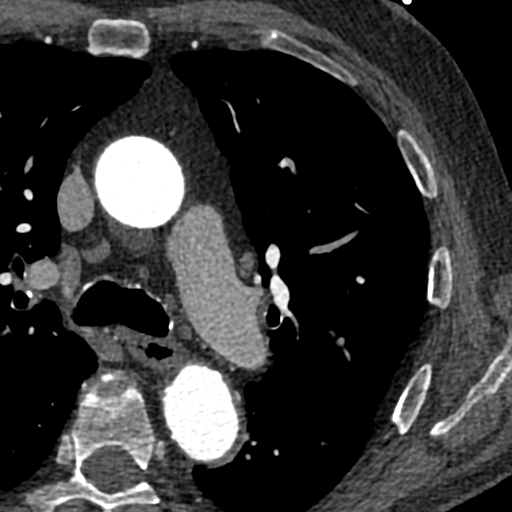
[im 3025/3630  vessel]
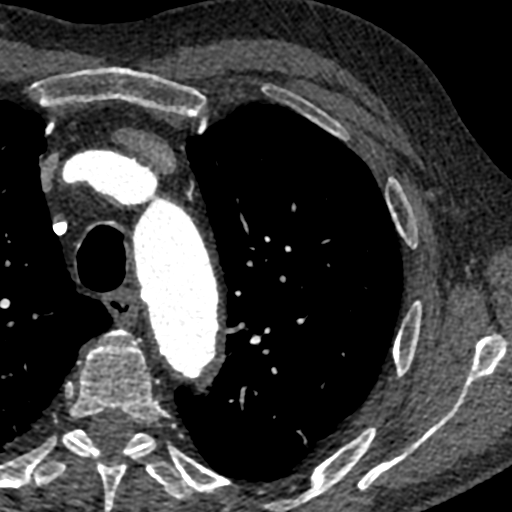

[10 of 20 positions shown; findings below may reference images not displayed]

FINDINGS: Extracardiac findings will be described separately under dictation
for contemporaneously obtained CTA chest, abdomen and pelvis.
IMPRESSION: Please see separate dictation for contemporaneously obtained CTA
chest, abdomen and pelvis dated 11/20/2020 for full description of
relevant extracardiac findings.
FINDINGS: Image quality: Excellent.

Noise artifact is: Limited.

Valve Morphology: The aortic valve is tricuspid. There are severe
calcifications and restricted movement in systole consistent with
severe aortic stenosis. There is acquired fusion of the RCC/LCC with
bulky commissural calcification.

Aortic Valve Calcium score: 3230

Aortic annular dimension:

Phase assessed: 20%

Annular area: 614 mm2

Annular perimeter: 90.4 mm

Max diameter: 31.6 mm

Min diameter: 25.6 mm

Annular and subannular calcification: Trivial annular calcium under
the RCC and LCC.

Optimal coplanar projection: LAO 17 LO 14

Coronary Artery Height above Annulus:

Left Main: 13.2 mm

Right Coronary: 18.9 mm

Sinus of Valsalva Measurements:

Non-coronary: 41 mm

Right-coronary: 43 mm

Left-coronary: 43 mm

Cusp to cusp (RCC to LCC): 46 mm.

Sinus of Valsalva Height:

Non-coronary: 31.3 mm

Right-coronary: 29.3 mm

Left-coronary: 24.7 mm

Sinotubular Junction: 36 mm

Ascending Thoracic Aorta: 42 mm

Coronary Arteries: Normal coronary origin. Left dominance. The study
was performed without use of NTG and is insufficient for plaque
evaluation. Please refer to recent cardiac catheterization for
coronary assessment. Coronary calcifications noted in the LAD/LCX.

Cardiac Morphology:

Right Atrium: Right atrial size is within normal limits.

Right Ventricle: The right ventricular cavity is within normal
limits.

Left Atrium: Left atrial size is normal in size with no left atrial
appendage filling defect.

Left Ventricle: The ventricular cavity size is within normal limits.
There are no stigmata of prior infarction. There is no abnormal
filling defect. Normal left ventricular function, EF=53%. No
regional wall motion abnormalities.

Pulmonary arteries: Normal in size without proximal filling defect.

Pulmonary veins: Normal pulmonary venous drainage.

Pericardium: Normal thickness with no significant effusion or
calcium present.

Mitral Valve: The mitral valve is normal structure without
significant calcification.

Extra-cardiac findings: See attached radiology report for
non-cardiac structures.
IMPRESSION: 1. Tricuspid aortic valve with acquired fusion of the RCC/LCC. Bulky
calcification noted at the commissure between the RCC/LCC.

2. Severe aortic stenosis (calcium score 3230).

3. Annular measurements appropriate for 29 mm S3 TAVR (615 mm2).

4. Trivial annular calcification.

5. Sufficient coronary to annulus distance.

6. Optimal Fluoroscopic Angle for Delivery: LAO 17 LO 14

7. Aortic root aneurysm noted (up to 46 mm cusp to cusp in double
oblique).

*** End of Addendum ***
EXAM:
OVER-READ INTERPRETATION  CT CHEST

The following report is an over-read performed by radiologist Dr.
Bi Lel Orsini [REDACTED] on 11/20/2020. This
over-read does not include interpretation of cardiac or coronary
anatomy or pathology. The coronary calcium score/coronary CTA
interpretation by the cardiologist is attached.
FINDINGS: Extracardiac findings will be described separately under dictation
for contemporaneously obtained CTA chest, abdomen and pelvis.
IMPRESSION: Please see separate dictation for contemporaneously obtained CTA
chest, abdomen and pelvis dated 11/20/2020 for full description of
relevant extracardiac findings.

## 2023-10-05 ENCOUNTER — Encounter: Payer: Self-pay | Admitting: Cardiovascular Disease

## 2023-10-06 ENCOUNTER — Other Ambulatory Visit: Payer: Self-pay

## 2023-10-10 DIAGNOSIS — Z23 Encounter for immunization: Secondary | ICD-10-CM | POA: Diagnosis not present

## 2023-10-10 DIAGNOSIS — C689 Malignant neoplasm of urinary organ, unspecified: Secondary | ICD-10-CM | POA: Diagnosis not present

## 2023-10-10 DIAGNOSIS — C679 Malignant neoplasm of bladder, unspecified: Secondary | ICD-10-CM | POA: Diagnosis not present

## 2023-10-10 DIAGNOSIS — L97528 Non-pressure chronic ulcer of other part of left foot with other specified severity: Secondary | ICD-10-CM | POA: Diagnosis not present

## 2023-10-10 DIAGNOSIS — I10 Essential (primary) hypertension: Secondary | ICD-10-CM | POA: Diagnosis not present

## 2023-10-10 DIAGNOSIS — E785 Hyperlipidemia, unspecified: Secondary | ICD-10-CM | POA: Diagnosis not present

## 2023-10-10 DIAGNOSIS — I739 Peripheral vascular disease, unspecified: Secondary | ICD-10-CM | POA: Diagnosis not present

## 2023-10-10 DIAGNOSIS — C649 Malignant neoplasm of unspecified kidney, except renal pelvis: Secondary | ICD-10-CM | POA: Diagnosis not present

## 2023-10-10 DIAGNOSIS — I480 Paroxysmal atrial fibrillation: Secondary | ICD-10-CM | POA: Diagnosis not present

## 2023-10-11 DIAGNOSIS — E785 Hyperlipidemia, unspecified: Secondary | ICD-10-CM | POA: Diagnosis not present

## 2023-10-11 DIAGNOSIS — I480 Paroxysmal atrial fibrillation: Secondary | ICD-10-CM | POA: Diagnosis not present

## 2023-10-11 DIAGNOSIS — L97522 Non-pressure chronic ulcer of other part of left foot with fat layer exposed: Secondary | ICD-10-CM | POA: Diagnosis not present

## 2023-10-11 DIAGNOSIS — I5042 Chronic combined systolic (congestive) and diastolic (congestive) heart failure: Secondary | ICD-10-CM | POA: Diagnosis not present

## 2023-10-11 DIAGNOSIS — I252 Old myocardial infarction: Secondary | ICD-10-CM | POA: Diagnosis not present

## 2023-10-11 DIAGNOSIS — I2510 Atherosclerotic heart disease of native coronary artery without angina pectoris: Secondary | ICD-10-CM | POA: Diagnosis not present

## 2023-10-11 DIAGNOSIS — I4892 Unspecified atrial flutter: Secondary | ICD-10-CM | POA: Diagnosis not present

## 2023-10-11 DIAGNOSIS — N184 Chronic kidney disease, stage 4 (severe): Secondary | ICD-10-CM | POA: Diagnosis not present

## 2023-10-11 DIAGNOSIS — I130 Hypertensive heart and chronic kidney disease with heart failure and stage 1 through stage 4 chronic kidney disease, or unspecified chronic kidney disease: Secondary | ICD-10-CM | POA: Diagnosis not present

## 2023-10-12 ENCOUNTER — Encounter (HOSPITAL_BASED_OUTPATIENT_CLINIC_OR_DEPARTMENT_OTHER): Admitting: Internal Medicine

## 2023-10-12 DIAGNOSIS — L84 Corns and callosities: Secondary | ICD-10-CM | POA: Diagnosis not present

## 2023-10-12 DIAGNOSIS — Z950 Presence of cardiac pacemaker: Secondary | ICD-10-CM | POA: Diagnosis not present

## 2023-10-12 DIAGNOSIS — Z7901 Long term (current) use of anticoagulants: Secondary | ICD-10-CM | POA: Diagnosis not present

## 2023-10-12 DIAGNOSIS — I4891 Unspecified atrial fibrillation: Secondary | ICD-10-CM | POA: Diagnosis not present

## 2023-10-12 DIAGNOSIS — L97528 Non-pressure chronic ulcer of other part of left foot with other specified severity: Secondary | ICD-10-CM | POA: Diagnosis not present

## 2023-10-12 DIAGNOSIS — Z905 Acquired absence of kidney: Secondary | ICD-10-CM | POA: Diagnosis not present

## 2023-10-12 DIAGNOSIS — Z8559 Personal history of malignant neoplasm of other urinary tract organ: Secondary | ICD-10-CM | POA: Diagnosis not present

## 2023-10-12 DIAGNOSIS — I5042 Chronic combined systolic (congestive) and diastolic (congestive) heart failure: Secondary | ICD-10-CM | POA: Diagnosis not present

## 2023-10-17 DIAGNOSIS — H43813 Vitreous degeneration, bilateral: Secondary | ICD-10-CM | POA: Diagnosis not present

## 2023-10-17 DIAGNOSIS — Z961 Presence of intraocular lens: Secondary | ICD-10-CM | POA: Diagnosis not present

## 2023-10-17 DIAGNOSIS — H43393 Other vitreous opacities, bilateral: Secondary | ICD-10-CM | POA: Diagnosis not present

## 2023-10-17 DIAGNOSIS — D3131 Benign neoplasm of right choroid: Secondary | ICD-10-CM | POA: Diagnosis not present

## 2023-10-17 DIAGNOSIS — H35033 Hypertensive retinopathy, bilateral: Secondary | ICD-10-CM | POA: Diagnosis not present

## 2023-10-17 DIAGNOSIS — H31092 Other chorioretinal scars, left eye: Secondary | ICD-10-CM | POA: Diagnosis not present

## 2023-10-17 DIAGNOSIS — H353122 Nonexudative age-related macular degeneration, left eye, intermediate dry stage: Secondary | ICD-10-CM | POA: Diagnosis not present

## 2023-10-17 DIAGNOSIS — H353211 Exudative age-related macular degeneration, right eye, with active choroidal neovascularization: Secondary | ICD-10-CM | POA: Diagnosis not present

## 2023-10-18 DIAGNOSIS — I2510 Atherosclerotic heart disease of native coronary artery without angina pectoris: Secondary | ICD-10-CM | POA: Diagnosis not present

## 2023-10-18 DIAGNOSIS — I480 Paroxysmal atrial fibrillation: Secondary | ICD-10-CM | POA: Diagnosis not present

## 2023-10-18 DIAGNOSIS — I252 Old myocardial infarction: Secondary | ICD-10-CM | POA: Diagnosis not present

## 2023-10-18 DIAGNOSIS — N184 Chronic kidney disease, stage 4 (severe): Secondary | ICD-10-CM | POA: Diagnosis not present

## 2023-10-18 DIAGNOSIS — E785 Hyperlipidemia, unspecified: Secondary | ICD-10-CM | POA: Diagnosis not present

## 2023-10-18 DIAGNOSIS — I5042 Chronic combined systolic (congestive) and diastolic (congestive) heart failure: Secondary | ICD-10-CM | POA: Diagnosis not present

## 2023-10-18 DIAGNOSIS — L97522 Non-pressure chronic ulcer of other part of left foot with fat layer exposed: Secondary | ICD-10-CM | POA: Diagnosis not present

## 2023-10-18 DIAGNOSIS — I4892 Unspecified atrial flutter: Secondary | ICD-10-CM | POA: Diagnosis not present

## 2023-10-18 DIAGNOSIS — I130 Hypertensive heart and chronic kidney disease with heart failure and stage 1 through stage 4 chronic kidney disease, or unspecified chronic kidney disease: Secondary | ICD-10-CM | POA: Diagnosis not present

## 2023-10-21 ENCOUNTER — Other Ambulatory Visit: Payer: Self-pay | Admitting: Cardiology

## 2023-10-24 NOTE — Progress Notes (Signed)
 Remote PPM Transmission

## 2023-10-25 ENCOUNTER — Telehealth: Payer: Self-pay | Admitting: Cardiovascular Disease

## 2023-10-25 DIAGNOSIS — N184 Chronic kidney disease, stage 4 (severe): Secondary | ICD-10-CM | POA: Diagnosis not present

## 2023-10-25 DIAGNOSIS — I130 Hypertensive heart and chronic kidney disease with heart failure and stage 1 through stage 4 chronic kidney disease, or unspecified chronic kidney disease: Secondary | ICD-10-CM | POA: Diagnosis not present

## 2023-10-25 DIAGNOSIS — I4892 Unspecified atrial flutter: Secondary | ICD-10-CM | POA: Diagnosis not present

## 2023-10-25 DIAGNOSIS — L97522 Non-pressure chronic ulcer of other part of left foot with fat layer exposed: Secondary | ICD-10-CM | POA: Diagnosis not present

## 2023-10-25 DIAGNOSIS — I2510 Atherosclerotic heart disease of native coronary artery without angina pectoris: Secondary | ICD-10-CM | POA: Diagnosis not present

## 2023-10-25 DIAGNOSIS — I480 Paroxysmal atrial fibrillation: Secondary | ICD-10-CM | POA: Diagnosis not present

## 2023-10-25 DIAGNOSIS — I5042 Chronic combined systolic (congestive) and diastolic (congestive) heart failure: Secondary | ICD-10-CM | POA: Diagnosis not present

## 2023-10-25 DIAGNOSIS — E785 Hyperlipidemia, unspecified: Secondary | ICD-10-CM | POA: Diagnosis not present

## 2023-10-25 DIAGNOSIS — I252 Old myocardial infarction: Secondary | ICD-10-CM | POA: Diagnosis not present

## 2023-10-25 MED ORDER — AMIODARONE HCL 200 MG PO TABS: 200.0000 mg | ORAL_TABLET | Freq: Every day | ORAL | 0 refills | Status: DC

## 2023-10-25 NOTE — Telephone Encounter (Signed)
 Pt's medication was sent to pt's pharmacy as requested. Confirmation received.

## 2023-10-25 NOTE — Telephone Encounter (Signed)
*  STAT* If patient is at the pharmacy, call can be transferred to refill team.   1. Which medications need to be refilled? (please list name of each medication and dose if known)   amiodarone  (PACERONE ) 200 MG tablet   2. Would you like to learn more about the convenience, safety, & potential cost savings by using the [facility]?   3. Are you open to using the [facility] (Type [facility]. ).  4. Which pharmacy/location (including street and city if local pharmacy) is medication to be sent to?  [facility] [address] [place] - [address]   5. Do they need a 30 day or 90 day supply?   90 day  Patient stated he still has some medication left.  Patient has next appointment scheduled with HILARIO Ford, PA on 10/22.

## 2023-10-27 ENCOUNTER — Encounter (HOSPITAL_BASED_OUTPATIENT_CLINIC_OR_DEPARTMENT_OTHER): Attending: Internal Medicine | Admitting: Internal Medicine

## 2023-10-27 DIAGNOSIS — I5042 Chronic combined systolic (congestive) and diastolic (congestive) heart failure: Secondary | ICD-10-CM | POA: Diagnosis not present

## 2023-10-27 DIAGNOSIS — L97528 Non-pressure chronic ulcer of other part of left foot with other specified severity: Secondary | ICD-10-CM | POA: Insufficient documentation

## 2023-10-27 DIAGNOSIS — Z8554 Personal history of malignant neoplasm of ureter: Secondary | ICD-10-CM | POA: Insufficient documentation

## 2023-10-27 DIAGNOSIS — Z905 Acquired absence of kidney: Secondary | ICD-10-CM | POA: Insufficient documentation

## 2023-10-27 DIAGNOSIS — E785 Hyperlipidemia, unspecified: Secondary | ICD-10-CM | POA: Diagnosis not present

## 2023-10-27 DIAGNOSIS — L84 Corns and callosities: Secondary | ICD-10-CM | POA: Diagnosis not present

## 2023-10-27 DIAGNOSIS — Z952 Presence of prosthetic heart valve: Secondary | ICD-10-CM | POA: Diagnosis not present

## 2023-10-27 DIAGNOSIS — I4891 Unspecified atrial fibrillation: Secondary | ICD-10-CM | POA: Insufficient documentation

## 2023-10-27 DIAGNOSIS — I2510 Atherosclerotic heart disease of native coronary artery without angina pectoris: Secondary | ICD-10-CM | POA: Diagnosis not present

## 2023-10-27 DIAGNOSIS — Z7901 Long term (current) use of anticoagulants: Secondary | ICD-10-CM | POA: Diagnosis not present

## 2023-10-27 DIAGNOSIS — Z950 Presence of cardiac pacemaker: Secondary | ICD-10-CM | POA: Diagnosis not present

## 2023-10-27 DIAGNOSIS — Z8551 Personal history of malignant neoplasm of bladder: Secondary | ICD-10-CM | POA: Insufficient documentation

## 2023-10-27 DIAGNOSIS — N184 Chronic kidney disease, stage 4 (severe): Secondary | ICD-10-CM | POA: Diagnosis not present

## 2023-10-27 DIAGNOSIS — I130 Hypertensive heart and chronic kidney disease with heart failure and stage 1 through stage 4 chronic kidney disease, or unspecified chronic kidney disease: Secondary | ICD-10-CM | POA: Diagnosis not present

## 2023-11-01 DIAGNOSIS — I252 Old myocardial infarction: Secondary | ICD-10-CM | POA: Diagnosis not present

## 2023-11-01 DIAGNOSIS — N184 Chronic kidney disease, stage 4 (severe): Secondary | ICD-10-CM | POA: Diagnosis not present

## 2023-11-01 DIAGNOSIS — I2510 Atherosclerotic heart disease of native coronary artery without angina pectoris: Secondary | ICD-10-CM | POA: Diagnosis not present

## 2023-11-01 DIAGNOSIS — I130 Hypertensive heart and chronic kidney disease with heart failure and stage 1 through stage 4 chronic kidney disease, or unspecified chronic kidney disease: Secondary | ICD-10-CM | POA: Diagnosis not present

## 2023-11-01 DIAGNOSIS — I4892 Unspecified atrial flutter: Secondary | ICD-10-CM | POA: Diagnosis not present

## 2023-11-01 DIAGNOSIS — E785 Hyperlipidemia, unspecified: Secondary | ICD-10-CM | POA: Diagnosis not present

## 2023-11-01 DIAGNOSIS — L97522 Non-pressure chronic ulcer of other part of left foot with fat layer exposed: Secondary | ICD-10-CM | POA: Diagnosis not present

## 2023-11-01 DIAGNOSIS — I480 Paroxysmal atrial fibrillation: Secondary | ICD-10-CM | POA: Diagnosis not present

## 2023-11-01 DIAGNOSIS — I5042 Chronic combined systolic (congestive) and diastolic (congestive) heart failure: Secondary | ICD-10-CM | POA: Diagnosis not present

## 2023-11-08 ENCOUNTER — Ambulatory Visit: Attending: Student | Admitting: Student

## 2023-11-08 ENCOUNTER — Encounter: Payer: Self-pay | Admitting: Student

## 2023-11-08 VITALS — BP 118/53 | Ht 73.0 in | Wt 189.6 lb

## 2023-11-08 DIAGNOSIS — I447 Left bundle-branch block, unspecified: Secondary | ICD-10-CM

## 2023-11-08 DIAGNOSIS — I480 Paroxysmal atrial fibrillation: Secondary | ICD-10-CM

## 2023-11-08 DIAGNOSIS — I495 Sick sinus syndrome: Secondary | ICD-10-CM

## 2023-11-08 DIAGNOSIS — Z79899 Other long term (current) drug therapy: Secondary | ICD-10-CM

## 2023-11-08 DIAGNOSIS — Z9189 Other specified personal risk factors, not elsewhere classified: Secondary | ICD-10-CM | POA: Diagnosis not present

## 2023-11-08 DIAGNOSIS — I5042 Chronic combined systolic (congestive) and diastolic (congestive) heart failure: Secondary | ICD-10-CM | POA: Diagnosis not present

## 2023-11-08 LAB — CUP PACEART INCLINIC DEVICE CHECK
Date Time Interrogation Session: 20251014120556
Implantable Lead Connection Status: 753985
Implantable Lead Connection Status: 753985
Implantable Lead Implant Date: 20230123
Implantable Lead Implant Date: 20230123
Implantable Lead Location: 753859
Implantable Lead Location: 753860
Implantable Lead Model: 7841
Implantable Lead Model: 7842
Implantable Lead Serial Number: 1101059
Implantable Lead Serial Number: 1169597
Implantable Pulse Generator Implant Date: 20230123
Lead Channel Impedance Value: 523 Ohm
Lead Channel Impedance Value: 709 Ohm
Lead Channel Pacing Threshold Amplitude: 0.7 V
Lead Channel Pacing Threshold Amplitude: 1.2 V
Lead Channel Pacing Threshold Pulse Width: 0.4 ms
Lead Channel Pacing Threshold Pulse Width: 0.4 ms
Lead Channel Sensing Intrinsic Amplitude: 5.5 mV
Lead Channel Sensing Intrinsic Amplitude: 6.9 mV
Lead Channel Setting Pacing Amplitude: 2.5 V
Lead Channel Setting Pacing Amplitude: 2.5 V
Lead Channel Setting Pacing Pulse Width: 0.4 ms
Lead Channel Setting Sensing Sensitivity: 2.5 mV
Pulse Gen Serial Number: 561085
Zone Setting Status: 755011

## 2023-11-08 NOTE — Progress Notes (Signed)
  Electrophysiology Office Note:   ID:  Jordan Oconnell, Jordan Oconnell 1940/07/09, MRN [id number]  Primary Cardiologist: Jerel Balding, MD Electrophysiologist: OLE ONEIDA HOLTS, MD      History of Present Illness:   Jordan Oconnell is a 83 y.o. male with h/o AS s/p TAVR, tachy brady s/p PPM, PAF, AFL on amiodarone , CKD IV, and h/o Urothelial CA seen today for routine electrophysiology followup.   Since last being seen in our clinic the patient reports doing well overall. No undue SOB and gets around and does what he needs to do. No chest pain, syncope, or edema.   Review of systems complete and found to be negative unless listed in HPI.   EP Information / Studies Reviewed:    EKG is ordered today. Personal review as below.  EKG Interpretation Date/Time:  Tuesday November 08 2023 08:46:02 EDT Ventricular Rate:  66 PR Interval:  224 QRS Duration:  148 QT Interval:  508 QTC Calculation: 532 R Axis:   261  Text Interpretation: Atrial-sensed ventricular-paced rhythm with prolonged AV conduction When compared with ECG of 03-May-2023 17:22, Vent. rate has decreased BY   6 BPM Confirmed by Lesia Sharper [phone]) on 11/08/2023 8:55:29 AM    PPM Interrogation-  reviewed in detail today,  See PACEART report.  Arrhythmia/Device History BSx Dual Chamber PPM 01/2021 for tachy-brady   Physical Exam:   VS:  BP (!) 118/53   Ht 6' 1 (1.854 m)   Wt 189 lb 9.6 oz (86 kg)   SpO2 95%   BMI 25.01 kg/m    Wt Readings from Last 3 Encounters:  11/08/23 189 lb 9.6 oz (86 kg)  06/16/23 184 lb 11.2 oz (83.8 kg)  05/04/23 188 lb (85.3 kg)     GEN: No acute distress  NECK: No JVD; No carotid bruits CARDIAC: Regular rate and rhythm, no murmurs, rubs, gallops RESPIRATORY:  Clear to auscultation without rales, wheezing or rhonchi  ABDOMEN: Soft, non-tender, non-distended EXTREMITIES:  No edema; No deformity   ASSESSMENT AND PLAN:    Tachy-Brady syndrome s/p [employer] PPM  Normal PPM  function See Pace Art report No changes today  PAF AFL EKG today shows NSR Continue amiodarone  200 mg daily Surveillance labs today Continue eliquis  2.5 mg BID for CHA2DS2/VASc of at least 5  Secondary hypercoagulable state Pt on Eliquis  as above    HFmrEF Ehco 02/2023 LVEF 45-50% Continue coreg  and hydralazine /imdur  Otherwise GDMT limited by CKD  CKD IV Baseline Cr 2.7 - 3.0.    Disposition:   Follow up with EP Team in 6 months  Signed, Sharper Prentice Lesia, PA-C

## 2023-11-08 NOTE — Patient Instructions (Addendum)
 Medication Instructions:  Your physician recommends that you continue on your current medications as directed. Please refer to the Current Medication list given to you today.  *If you need a refill on your cardiac medications before your next appointment, please call your pharmacy*  Lab Work: CMET, TSH, FreeT4-TODAY If you have labs (blood work) drawn today and your tests are completely normal, you will receive your results only by: MyChart Message (if you have MyChart) OR A paper copy in the mail If you have any lab test that is abnormal or we need to change your treatment, we will call you to review the results.  Follow-Up: At [facility], you and your health needs are our priority.  As part of our continuing mission to provide you with exceptional heart care, our providers are all part of one team.  This team includes your primary Cardiologist (physician) and Advanced Practice Providers or APPs (Physician Assistants and Nurse Practitioners) who all work together to provide you with the care you need, when you need it.  Your next appointment:   6 month(s)  Provider:   Bambi Lever "Michaelle Adolphus, PA-C

## 2023-11-09 ENCOUNTER — Ambulatory Visit: Payer: Self-pay | Admitting: Cardiology

## 2023-11-09 DIAGNOSIS — H90A32 Mixed conductive and sensorineural hearing loss, unilateral, left ear with restricted hearing on the contralateral side: Secondary | ICD-10-CM | POA: Diagnosis not present

## 2023-11-09 LAB — COMPREHENSIVE METABOLIC PANEL WITH GFR
ALT: 15 IU/L (ref 0–44)
AST: 16 IU/L (ref 0–40)
Albumin: 4.3 g/dL (ref 3.7–4.7)
Alkaline Phosphatase: 65 IU/L (ref 48–129)
BUN/Creatinine Ratio: 19 (ref 10–24)
BUN: 62 mg/dL — ABNORMAL HIGH (ref 8–27)
Bilirubin Total: 0.5 mg/dL (ref 0.0–1.2)
CO2: 15 mmol/L — AB (ref 20–29)
Calcium: 9.1 mg/dL (ref 8.6–10.2)
Chloride: 107 mmol/L — ABNORMAL HIGH (ref 96–106)
Creatinine, Ser: 3.35 mg/dL — ABNORMAL HIGH (ref 0.76–1.27)
Globulin, Total: 2.1 g/dL (ref 1.5–4.5)
Glucose: 96 mg/dL (ref 70–99)
Potassium: 5 mmol/L (ref 3.5–5.2)
Sodium: 140 mmol/L (ref 134–144)
Total Protein: 6.4 g/dL (ref 6.0–8.5)
eGFR: 17 mL/min/1.73 — ABNORMAL LOW (ref >59)

## 2023-11-09 LAB — TSH: TSH: 22.1 u[IU]/mL — AB (ref 0.450–4.500)

## 2023-11-09 LAB — T4, FREE: Free T4: 0.79 ng/dL — AB (ref 0.82–1.77)

## 2023-11-09 NOTE — Progress Notes (Signed)
 Reason for Visit: Jordan Oconnell is here for follow-up regarding bilateral sensorineural hearing loss. Given these concerns, a hearing test was completed to assess Jordan Oconnell's hearing ability and speech recognition abilities.    Results: Refer to results scanned into Media tab.  Otoscopy:  Otoscopic examination showed canals free of excessive cerumen.   Tympanometry: Tympanometry was completed due to mixed hearing loss. Results were: Right Ear: Type Ad - Normal middle ear pressure and ear canal volume with hypermobile eardrum. Left Ear: Type Ad - Normal middle ear pressure and ear canal volume with hypermobile eardrum.  Audiometric Results: Results were obtained using headphones. Reliability was good. Results indicate mild to profound mixed hearing loss bilaterally.    Speech Reception Thresholds: Completed using monitored live voice. Right Ear: 50 dBHL Left Ear: 55 dBHL  Word Recognition Testing: Completed using recording with [id number] List. Right Ear: 60 % at 90 dBHL with 60 dBem Left Ear: 64 % at 90 dBHL with 60 dBem   Recommendations: Recommend repeat test in one year, sooner with concerns of sudden hearing loss, deteriorating hearing, or new or worsening tinnitus. Continue hearing aid use.

## 2023-11-10 ENCOUNTER — Encounter (HOSPITAL_BASED_OUTPATIENT_CLINIC_OR_DEPARTMENT_OTHER): Admitting: Internal Medicine

## 2023-11-10 DIAGNOSIS — L97528 Non-pressure chronic ulcer of other part of left foot with other specified severity: Secondary | ICD-10-CM | POA: Diagnosis not present

## 2023-11-10 DIAGNOSIS — L84 Corns and callosities: Secondary | ICD-10-CM | POA: Diagnosis not present

## 2023-11-10 DIAGNOSIS — Z7901 Long term (current) use of anticoagulants: Secondary | ICD-10-CM | POA: Diagnosis not present

## 2023-11-16 ENCOUNTER — Ambulatory Visit: Admitting: Physician Assistant

## 2023-11-22 ENCOUNTER — Ambulatory Visit

## 2023-11-23 ENCOUNTER — Ambulatory Visit: Attending: Physician Assistant | Admitting: Cardiology

## 2023-11-23 ENCOUNTER — Encounter: Payer: Self-pay | Admitting: Cardiology

## 2023-11-23 VITALS — BP 118/78 | HR 77 | Ht 73.0 in | Wt 190.6 lb

## 2023-11-23 DIAGNOSIS — Z952 Presence of prosthetic heart valve: Secondary | ICD-10-CM | POA: Diagnosis not present

## 2023-11-23 DIAGNOSIS — Z950 Presence of cardiac pacemaker: Secondary | ICD-10-CM

## 2023-11-23 DIAGNOSIS — I495 Sick sinus syndrome: Secondary | ICD-10-CM | POA: Diagnosis not present

## 2023-11-23 DIAGNOSIS — I5042 Chronic combined systolic (congestive) and diastolic (congestive) heart failure: Secondary | ICD-10-CM

## 2023-11-23 DIAGNOSIS — I10 Essential (primary) hypertension: Secondary | ICD-10-CM

## 2023-11-23 DIAGNOSIS — I350 Nonrheumatic aortic (valve) stenosis: Secondary | ICD-10-CM | POA: Diagnosis not present

## 2023-11-23 DIAGNOSIS — I480 Paroxysmal atrial fibrillation: Secondary | ICD-10-CM

## 2023-11-23 NOTE — Patient Instructions (Signed)
 Medication Instructions:  Your physician recommends that you continue on your current medications as directed. Please refer to the Current Medication list given to you today.  *If you need a refill on your cardiac medications before your next appointment, please call your pharmacy*  Lab Work: None ordered If you have labs (blood work) drawn today and your tests are completely normal, you will receive your results only by: MyChart Message (if you have MyChart) OR A paper copy in the mail If you have any lab test that is abnormal or we need to change your treatment, we will call you to review the results.  Testing/Procedures: None ordered  Follow-Up: At [facility], you and your health needs are our priority.  As part of our continuing mission to provide you with exceptional heart care, our providers are all part of one team.  This team includes your primary Cardiologist (physician) and Advanced Practice Providers or APPs (Physician Assistants and Nurse Practitioners) who all work together to provide you with the care you need, when you need it.  Your next appointment:   1 year(s)  Provider:   Jerel Balding, MD    We recommend signing up for the patient portal called MyChart.  Sign up information is provided on this After Visit Summary.  MyChart is used to connect with patients for Virtual Visits (Telemedicine).  Patients are able to view lab/test results, encounter notes, upcoming appointments, etc.  Non-urgent messages can be sent to your provider as well.   To learn more about what you can do with MyChart, go to [email or web address].

## 2023-11-23 NOTE — Progress Notes (Signed)
 Cardiology Office Note:    Date:  11/23/2023   ID:  Jordan Oconnell, DOB Jul 16, 1940, MRN [id number]  PCP:  Jordan Ade, MD   [facility] Providers Cardiologist:  Jordan Balding, MD Electrophysiologist:  Jordan ONEIDA HOLTS, MD {   Referring MD: Jordan Ade, MD   Chief Complaint  Patient presents with   Follow-up    CHF, CAD, PAF, tachy-brady syndrome   History of Present Illness:    Jordan Oconnell is a 83 y.o. male with a hx of aortic stenosis s/p TAVR Jordan Oconnell 29 mm, 01/2021), coronary artery disease, chronic HFmrEF (EF 45-50%), tachy-brady syndrome s/p dual chamber pacemaker (BSC Accolade with His bundle lead, 01/2021, Oconnell), paroxysmal atrial fibrillation/flutter on amiodarone , CKD stage 4 (baseline creatinine, 2.7-3.0), history of urothelial cancer, renal cell carcinoma,  moderate aortic root aneurysm (46 mm by CT October 2022), hypothyroidism, who is being seen for follow up.   Jordan Oconnell typically follows with Dr. Balding as well as our EP team. He saw our EP team 11/08/2023 who noted PPM was functioning normally, patient was in NSR, and there were no changes made. Labs were drawn at this visit that showed elevated TSH with decreased free T4, after confirming that patient takes synthroid  he confirmed he was going to discuss with PCP about potential dose adjustment. His creatinine was slightly above what our last known baseline was, looks like he had fallen off seeing a nephrologist so he reached out and made an appointment with his nephrologist at [facility].   Today in clinic, he reports he has been doing well. Denies any recent chest pain, shortness of breath, palpitations, dizziness, lightheadedness, syncope, presyncope. He reports being very active, typically walking 3 miles/day, prior to undergoing recent toe amputations. He has been doing very well. Reports good BP readings at home.   Past Medical History:  Diagnosis Date   Bladder cancer (HCC)    BPH  (benign prostatic hyperplasia)    CKD (chronic kidney disease), stage IV (HCC)    Colon polyps    Diverticulosis    GERD (gastroesophageal reflux disease)    Hyperlipidemia    pt denies   Hypertension    Osteomyelitis of left foot (HCC) 04/26/2023   PAF (paroxysmal atrial fibrillation) (HCC)    on Flecanide and diltiazem . 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 Wonda and Dr. Lucas   Severe aortic stenosis 10/05/2020   Toe pain, left 04/15/2023   Past Surgical History:  Procedure Laterality Date   AMPUTATION TOE Right 03/02/2023   Procedure: AMPUTATION TOE, 4th;  Surgeon: Barton Drape, MD;  Location: [facility] ORS;  Service: Orthopedics;  Laterality: Right;   AMPUTATION TOE Left 05/04/2023   Procedure: AMPUTATION, TOE;  Surgeon: Barton Drape, MD;  Location: [facility] ORS;  Service: Orthopedics;  Laterality: Left;  left foot 4th toe metatarsalphalangeal joint disarticulation   APPENDECTOMY     BLADDER SURGERY  01/25/2006   transurethral resection/resection of prostatic urethra   BOWEL RESECTION     CARDIOVERSION N/A 03/03/2022   Procedure: CARDIOVERSION;  Surgeon: Lonni Slain, MD;  Location: [facility] ENDOSCOPY;  Service: Cardiovascular;  Laterality: N/A;   CARDIOVERSION N/A 03/12/2022   Procedure: CARDIOVERSION;  Surgeon: Sheena Pugh, DO;  Location: [facility] ENDOSCOPY;  Service: Cardiovascular;  Laterality:  N/A;   ESOPHAGOGASTRODUODENOSCOPY N/A 07/24/2013   Procedure: ESOPHAGOGASTRODUODENOSCOPY (EGD);  Surgeon: Norleen LOISE Kiang, MD;  Location: [facility];  Service: Endoscopy;  Laterality: N/A;   INTRAOPERATIVE TRANSTHORACIC ECHOCARDIOGRAM N/A 01/27/2021   Procedure: INTRAOPERATIVE TRANSTHORACIC ECHOCARDIOGRAM;  Surgeon: Wonda Sharper, MD;  Location: [facility] INVASIVE CV LAB;  Service: Open  Heart Surgery;  Laterality: N/A;   IR FLUORO GUIDE CV LINE RIGHT  03/04/2023   IR REMOVAL TUN CV CATH W/O FL  05/09/2023   IR US  GUIDE VASC ACCESS RIGHT  03/04/2023   laparoscopic surgery  01/25/2010   laser   (?)   NEPHRECTOMY  01/25/2005   partial, left   NEPHROSTOMY  01/25/2009   stent   NM MYOCAR PERF WALL MOTION  09/08/2010   Normal   PACEMAKER IMPLANT N/A 02/12/2021   Procedure: PACEMAKER IMPLANT;  Surgeon: Cindie Jordan DASEN, MD;  Location: [facility] INVASIVE CV LAB;  Service: Cardiovascular;  Laterality: N/A;   RIGHT HEART CATH AND CORONARY ANGIOGRAPHY N/A 10/06/2020   Procedure: RIGHT HEART CATH AND CORONARY ANGIOGRAPHY;  Surgeon: Wonda Sharper, MD;  Location: [facility] INVASIVE CV LAB;  Service: Cardiovascular;  Laterality: N/A;   ROBOT ASSISTED LAPAROSCOPIC COMPLETE CYSTECT ILEAL CONDUIT     TRANSCATHETER AORTIC VALVE REPLACEMENT, TRANSFEMORAL Right 01/27/2021   Procedure: TRANSCATHETER AORTIC VALVE REPLACEMENT, TRANSFEMORAL;  Surgeon: Wonda Sharper, MD;  Location: [facility] INVASIVE CV LAB;  Service: Open Heart Surgery;  Laterality: Right;   US  ECHOCARDIOGRAPHY  09/08/2010   mild diastolic dysfunction,mild dilated LA,mild MR,AI,mildly dilated aortic root   Current Medications: Current Meds  Medication Sig   acetaminophen  (TYLENOL ) 500 MG tablet Take 1,000 mg by mouth See admin instructions. Take 1,000 mg by mouth in the morning and evening   allopurinol  (ZYLOPRIM ) 100 MG tablet Take 1 tablet (100 mg total) by mouth daily.   ALPRAZolam  (XANAX ) 0.25 MG tablet Take 1 tablet (0.25 mg total) by mouth at bedtime.   amiodarone  (PACERONE ) 200 MG tablet Take 1 tablet (200 mg total) by mouth daily.   amLODipine  (NORVASC ) 10 MG tablet Take 1 tablet (10 mg total) by mouth daily.   aspirin  EC 81 MG tablet Take 1 tablet (81 mg total) by mouth daily. Swallow whole.   atorvastatin  (LIPITOR) 10 MG tablet Take 1 tablet (10 mg total) by mouth daily. (Patient taking differently: Take 10 mg by mouth at bedtime.)    bisacodyl  (DULCOLAX) 5 MG EC tablet Take 1 tablet (5 mg total) by mouth daily. Hold for 24 hours if more than 2 loose bowel movements in one day.   carvedilol  (COREG ) 12.5 MG tablet Take 12.5 mg by mouth 2 (two) times daily.   cefadroxil  (DURICEF) 500 MG capsule Take 1 capsule (500 mg total) by mouth 2 (two) times daily.   cyanocobalamin  1000 MCG tablet Take 1 tablet (1,000 mcg total) by mouth daily.   docusate sodium  (COLACE) 100 MG capsule Take 1 capsule (100 mg total) by mouth daily.   ELIQUIS  2.5 MG TABS tablet Take 1 tablet by mouth twice daily   feeding supplement (ENSURE ENLIVE / ENSURE PLUS) LIQD Take 237 mLs by mouth 2 (two) times daily between meals.   gentamicin  cream (GARAMYCIN ) 0.1 % Apply 1 Application topically at bedtime.   hydrALAZINE  (APRESOLINE ) 25 MG tablet Take 1 tablet (25 mg total) by mouth 2 (two) times daily.   isosorbide  mononitrate (IMDUR ) 30 MG 24 hr tablet Take 1 tablet (30 mg total) by mouth daily.   levothyroxine  (SYNTHROID ) 75 MCG tablet Take 75 mcg by  mouth daily before breakfast.   linezolid  (ZYVOX ) 600 MG tablet Take 1 tablet (600 mg total) by mouth every 12 (twelve) hours.   mirtazapine  (REMERON ) 15 MG tablet Take 7.5 mg by mouth at bedtime.   nutrition supplement, JUVEN, (JUVEN) PACK Take 1 packet by mouth 2 (two) times daily with a meal.   oxyCODONE  (OXY IR/ROXICODONE ) 5 MG immediate release tablet Take 1 tablet (5 mg total) by mouth every 6 (six) hours as needed for moderate pain (pain score 4-6).   sodium bicarbonate  650 MG tablet Take 1,300 mg by mouth See admin instructions. Take 1,300 mg by mouth in the morning and evening    Allergies:   Amoxicillin, Doxycycline, and Flomax [tamsulosin hcl]   Social History   Socioeconomic History   Marital status: Divorced    Spouse name: Not on file   Number of children: 2   Years of education: Not on file   Highest education level: Not on file  Occupational History   Occupation: runner, broadcasting/film/video  Tobacco Use    Smoking status: Never   Smokeless tobacco: Never  Vaping Use   Vaping status: Never Used  Substance and Sexual Activity   Alcohol  use: Not Currently    Comment: occasionally   Drug use: No   Sexual activity: Not on file  Other Topics Concern   Not on file  Social History Narrative   Not on file   Social Drivers of Health   Financial Resource Strain: Not on file  Food Insecurity: No Food Insecurity (05/03/2023)   Hunger Vital Sign    Worried About Running Out of Food in the Last Year: Never true    Ran Out of Food in the Last Year: Never true  Transportation Needs: No Transportation Needs (05/03/2023)   PRAPARE - Administrator, Civil Service (Medical): No    Lack of Transportation (Non-Medical): No  Physical Activity: Not on file  Stress: Not on file  Social Connections: Unknown (05/03/2023)   Social Connection and Isolation Panel    Frequency of Communication with Friends and Family: Twice a week    Frequency of Social Gatherings with Friends and Family: Twice a week    Attends Religious Services: Patient declined    Database Administrator or Organizations: No    Attends Engineer, Structural: Never    Marital Status: Patient declined  Recent Concern: Social Connections - Moderately Isolated (03/12/2023)   Social Connection and Isolation Panel    Frequency of Communication with Friends and Family: More than three times a week    Frequency of Social Gatherings with Friends and Family: Once a week    Attends Religious Services: Never    Database Administrator or Organizations: No    Attends Engineer, Structural: Never    Marital Status: Living with partner    Family History: The patient's family history includes Breast cancer in his daughter; Colon cancer in his brother, father, and mother; Crohn's disease in his son; Ovarian cancer in his mother.  ROS:   Please see the history of present illness.     All other systems reviewed and are  negative.  EKGs/Labs/Other Studies Reviewed:    The following studies were reviewed today:     Recent Labs: 03/06/2023: B Natriuretic Peptide 151.0 03/11/2023: Magnesium  2.1 05/11/2023: Hemoglobin 9.6; Platelets 126 11/08/2023: ALT 15; BUN 62; Creatinine, Ser 3.35; Potassium 5.0; Sodium 140; TSH 22.100  Recent Lipid Panel    Component Value Date/Time  CHOL 82 03/08/2023 0427   TRIG 69 03/08/2023 0427   HDL 34 (L) 03/08/2023 0427   CHOLHDL 2.4 03/08/2023 0427   VLDL 14 03/08/2023 0427   LDLCALC 34 03/08/2023 0427    Risk Assessment/Calculations:    CHA2DS2-VASc Score = 4   This indicates a 4.8% annual risk of stroke. The patient's score is based upon: CHF History: 1 HTN History: 1 Diabetes History: 0 Stroke History: 0 Vascular Disease History: 0 Age Score: 2 Gender Score: 0       Physical Exam:    VS:  BP 118/78   Pulse 77   Ht 6' 1 (1.854 m)   Wt 190 lb 9.6 oz (86.5 kg)   SpO2 96%   BMI 25.15 kg/m     Wt Readings from Last 3 Encounters:  11/23/23 190 lb 9.6 oz (86.5 kg)  11/08/23 189 lb 9.6 oz (86 kg)  06/16/23 184 lb 11.2 oz (83.8 kg)    GEN:  Well nourished, well developed in no acute distress HEENT: Normal NECK: No JVD; No carotid bruits LYMPHATICS: No lymphadenopathy CARDIAC: RRR, no murmurs, rubs, gallops RESPIRATORY:  Clear to auscultation without rales, wheezing or rhonchi  ABDOMEN: Soft, non-tender, non-distended MUSCULOSKELETAL:  No edema; Boot on left foot s/p toe amputation   SKIN: Warm and dry NEUROLOGIC:  Alert and oriented x 3 PSYCHIATRIC:  Normal affect   ASSESSMENT:    1. Essential hypertension   2. Chronic combined systolic and diastolic CHF (congestive heart failure) (HCC)   3. Tachycardia-bradycardia syndrome (HCC)   4. Cardiac pacemaker in situ   5. PAF (paroxysmal atrial fibrillation) (HCC)   6. Severe aortic stenosis   7. S/P TAVR (transcatheter aortic valve replacement)    PLAN:    Chronic HFmrEF Hypertension  Home  meds: amlodipine  10 mg daily, carvedilol  12.5 mg BID, hydralazine  25 mg BID, Imdur  30 mg daily,  Echo 02/2023: LVEF 45-50%, G1DD, normal RV systolic function, normal IVC BP today 118/78, reports good numbers at home GDMT limited given renal function He reports no need for refills at this time  Continue home medications   Coronary artery disease Home meds: ASA 81 mg daily, Lipitor 10 mg daily LHC pre-TAVR showed: 90% stenosis in D1, otherwise moderate diffuse stenosis  03/08/2023: HDL 34; LDL Cholesterol 34 11/08/2023: ALT 15  Continue home medications   Paroxysmal atrial fibrillation/flutter Home meds: amiodarone  200 mg daily, Eliquis  2.5 mg BID Recent EKG from 10/2023 showed NSR Recent elevated TSH with decreased free T4, following with PCP -- they have increased levothyroxine  and will recheck labs in 2 weeks Continue home medications Follows with EP as well   Tachy-brady syndrome S/p PPM Recent device interrogation showed normal function Continues to follow with EP  Aortic stenosis s/p TAVR Normal valve function per most recent echo No recent symptoms  Aware of endocarditis prophylaxis with dental procedures  Continue to follow clinically   Follow up: 1 year with Dr. Francyne     Medication Adjustments/Labs and Tests Ordered: Current medicines are reviewed at length with the patient today.  Concerns regarding medicines are outlined above.  No orders of the defined types were placed in this encounter.  No orders of the defined types were placed in this encounter.   Patient Instructions  Medication Instructions:  Your physician recommends that you continue on your current medications as directed. Please refer to the Current Medication list given to you today.  *If you need a refill on your cardiac medications before your  next appointment, please call your pharmacy*  Lab Work: None ordered If you have labs (blood work) drawn today and your tests are completely normal,  you will receive your results only by: MyChart Message (if you have MyChart) OR A paper copy in the mail If you have any lab test that is abnormal or we need to change your treatment, we will call you to review the results.  Testing/Procedures: None ordered  Follow-Up: At [facility], you and your health needs are our priority.  As part of our continuing mission to provide you with exceptional heart care, our providers are all part of one team.  This team includes your primary Cardiologist (physician) and Advanced Practice Providers or APPs (Physician Assistants and Nurse Practitioners) who all work together to provide you with the care you need, when you need it.  Your next appointment:   1 year(s)  Provider:   Jerel Balding, MD    We recommend signing up for the patient portal called MyChart.  Sign up information is provided on this After Visit Summary.  MyChart is used to connect with patients for Virtual Visits (Telemedicine).  Patients are able to view lab/test results, encounter notes, upcoming appointments, etc.  Non-urgent messages can be sent to your provider as well.   To learn more about what you can do with MyChart, go to [email or web address].              Signed, Waddell DELENA Donath, PA-C  11/23/2023 4:16 PM    [facility]

## 2023-11-24 ENCOUNTER — Encounter (HOSPITAL_BASED_OUTPATIENT_CLINIC_OR_DEPARTMENT_OTHER): Admitting: Internal Medicine

## 2023-11-24 DIAGNOSIS — L97528 Non-pressure chronic ulcer of other part of left foot with other specified severity: Secondary | ICD-10-CM | POA: Diagnosis not present

## 2023-11-24 DIAGNOSIS — L84 Corns and callosities: Secondary | ICD-10-CM | POA: Diagnosis not present

## 2023-11-29 ENCOUNTER — Ambulatory Visit

## 2023-11-29 DIAGNOSIS — I495 Sick sinus syndrome: Secondary | ICD-10-CM

## 2023-11-30 LAB — CUP PACEART REMOTE DEVICE CHECK
Battery Remaining Longevity: 132 mo
Battery Remaining Percentage: 100 %
Brady Statistic RA Percent Paced: 2 %
Brady Statistic RV Percent Paced: 100 %
Date Time Interrogation Session: 20251104060000
Implantable Lead Connection Status: 753985
Implantable Lead Connection Status: 753985
Implantable Lead Implant Date: 20230123
Implantable Lead Implant Date: 20230123
Implantable Lead Location: 753859
Implantable Lead Location: 753860
Implantable Lead Model: 7841
Implantable Lead Model: 7842
Implantable Lead Serial Number: 1101059
Implantable Lead Serial Number: 1169597
Implantable Pulse Generator Implant Date: 20230123
Lead Channel Impedance Value: 519 Ohm
Lead Channel Impedance Value: 728 Ohm
Lead Channel Pacing Threshold Amplitude: 0.5 V
Lead Channel Pacing Threshold Amplitude: 1.1 V
Lead Channel Pacing Threshold Pulse Width: 0.4 ms
Lead Channel Pacing Threshold Pulse Width: 0.4 ms
Lead Channel Setting Pacing Amplitude: 2.5 V
Lead Channel Setting Pacing Amplitude: 2.5 V
Lead Channel Setting Pacing Pulse Width: 0.4 ms
Lead Channel Setting Sensing Sensitivity: 2.5 mV
Pulse Gen Serial Number: 561085
Zone Setting Status: 755011

## 2023-12-01 DIAGNOSIS — L84 Corns and callosities: Secondary | ICD-10-CM | POA: Diagnosis not present

## 2023-12-01 DIAGNOSIS — I70203 Unspecified atherosclerosis of native arteries of extremities, bilateral legs: Secondary | ICD-10-CM | POA: Diagnosis not present

## 2023-12-01 DIAGNOSIS — B351 Tinea unguium: Secondary | ICD-10-CM | POA: Diagnosis not present

## 2023-12-01 DIAGNOSIS — M205X2 Other deformities of toe(s) (acquired), left foot: Secondary | ICD-10-CM | POA: Diagnosis not present

## 2023-12-01 DIAGNOSIS — M205X1 Other deformities of toe(s) (acquired), right foot: Secondary | ICD-10-CM | POA: Diagnosis not present

## 2023-12-02 NOTE — Progress Notes (Signed)
 Remote PPM Transmission

## 2023-12-05 ENCOUNTER — Ambulatory Visit: Payer: Self-pay | Admitting: Cardiology

## 2023-12-05 DIAGNOSIS — Z936 Other artificial openings of urinary tract status: Secondary | ICD-10-CM | POA: Diagnosis not present

## 2023-12-14 ENCOUNTER — Ambulatory Visit: Admitting: Physician Assistant

## 2024-01-05 ENCOUNTER — Other Ambulatory Visit: Payer: Self-pay | Admitting: Cardiovascular Disease

## 2024-01-05 DIAGNOSIS — I480 Paroxysmal atrial fibrillation: Secondary | ICD-10-CM

## 2024-01-05 NOTE — Telephone Encounter (Signed)
 Prescription refill request for Eliquis  received. Indication: afib  Last office visit: [facility] 11/23/2023 Scr: 2.7 07/26/2023 Age: 83 yo  Weight: 86.5 kg   Refill sent.

## 2024-01-23 ENCOUNTER — Other Ambulatory Visit (HOSPITAL_COMMUNITY): Payer: Self-pay

## 2024-01-27 ENCOUNTER — Other Ambulatory Visit (HOSPITAL_COMMUNITY): Payer: Self-pay

## 2024-01-27 ENCOUNTER — Other Ambulatory Visit: Payer: Self-pay | Admitting: Cardiology

## 2024-02-03 ENCOUNTER — Telehealth: Payer: Self-pay | Admitting: Cardiovascular Disease

## 2024-02-03 ENCOUNTER — Other Ambulatory Visit (HOSPITAL_COMMUNITY): Payer: Self-pay

## 2024-02-03 DIAGNOSIS — Z5181 Encounter for therapeutic drug level monitoring: Secondary | ICD-10-CM

## 2024-02-03 DIAGNOSIS — I5042 Chronic combined systolic (congestive) and diastolic (congestive) heart failure: Secondary | ICD-10-CM

## 2024-02-03 DIAGNOSIS — I351 Nonrheumatic aortic (valve) insufficiency: Secondary | ICD-10-CM

## 2024-02-03 DIAGNOSIS — D508 Other iron deficiency anemias: Secondary | ICD-10-CM

## 2024-02-03 DIAGNOSIS — N184 Chronic kidney disease, stage 4 (severe): Secondary | ICD-10-CM

## 2024-02-03 DIAGNOSIS — E038 Other specified hypothyroidism: Secondary | ICD-10-CM

## 2024-02-03 DIAGNOSIS — Z950 Presence of cardiac pacemaker: Secondary | ICD-10-CM

## 2024-02-03 MED ORDER — NITROGLYCERIN 0.4 MG SL SUBL
SUBLINGUAL_TABLET | SUBLINGUAL | 3 refills | Status: AC
  Filled 2024-02-03: qty 25, 5d supply, fill #0

## 2024-02-03 NOTE — Telephone Encounter (Signed)
 Returned patient call  Transmission received in [facility] and reviewed by this RN  No acute issues noted  Sensing, threshold, and impedance values WNL   Multiple false PMT episodes noted and c/w ST with PVC's  Patient noted that he has been having increased fatigue   Will defer to primary cardiology for any possible recommendations

## 2024-02-03 NOTE — Telephone Encounter (Signed)
 Left message to call back.

## 2024-02-03 NOTE — Telephone Encounter (Signed)
 Pt called in stating he is going to send in a transmission to make sure everything looks fine on his device. Pt states his phone goes to vm but if you leave a msg he will call right back

## 2024-02-03 NOTE — Telephone Encounter (Signed)
 Spoke with pt per incoming call. Pt stated he wanted to make sure his pace maker and aorta were doing what they are suppose to. Asked pt if he had sent a transmission. Pt stated he did not know how to do that. Advised pt device clinic could walk him through sending a transmission. Asked him if there was anything I could help him with. He said he was recovering from feeling bad but was eating better and feeling better other than fatigue. Pt stated he would call device clinic.

## 2024-02-03 NOTE — Telephone Encounter (Signed)
 Pacemaker download does not show any abnormalities.  Note that his thyroid  was insufficiently replenished based on the labs performed in November.  Hypothyroidism can definitely cause fatigue.  It is about time to repeat those labs.  He is almost due for his yearly echocardiogram for his aortic valve prosthesis and for an office visit. -Please draw CBC, BMET, BNP and TSH and free T4 -Echocardiogram for AVR -Follow-up visit after these tests are done. Thank you

## 2024-02-03 NOTE — Telephone Encounter (Signed)
 Pt c/o BP issue: STAT if pt c/o blurred vision, one-sided weakness or slurred speech.  STAT if BP is GREATER than 180/120 TODAY.  STAT if BP is LESS than 90/60 and SYMPTOMATIC TODAY  1. What is your BP concern?   Patient is concern about his low BP last weekend and wanted to check his pacemaker readings were OK  2. Have you taken any BP medication today?    3. What are your last 5 BP readings?  127/64 - this morning (1/9) 125/62 - yesterday (1/8) 1/7 - 135/66 1/6 - 121/60 1/5 -132/64 1/4 - 119/64 1/3 - 100/48 am 1/3 - 114/58 pm  4. Are you having any other symptoms (ex. Dizziness, headache, blurred vision, passed out)?   Patient noted when he stands up quickly he feels dizzy   Patient stated last weekend he was feeling very fatigued and his BP had dropped to 100/48.  Patient noted he also had the flu but is feeling better.

## 2024-02-06 NOTE — Telephone Encounter (Signed)
 Labs and Echocardiogram ordered. Will need a f/u visit scheduled after Echo

## 2024-02-06 NOTE — Addendum Note (Signed)
 Addended by: DAVEE IZETTA CROME on: 02/06/2024 06:53 PM   Modules accepted: Orders

## 2024-02-07 NOTE — Telephone Encounter (Signed)
 S/w the patient. He is scheduled for Echo 02/08/24, he will get lab work after the Echo. Made an appt for 02/15/24.  He wants me to call with results bc he does not use [facility]. Informed him that I will call him with results. He verbalized understanding of all information.

## 2024-02-08 ENCOUNTER — Ambulatory Visit: Payer: Self-pay | Admitting: Cardiovascular Disease

## 2024-02-08 ENCOUNTER — Telehealth: Payer: Self-pay | Admitting: *Deleted

## 2024-02-08 ENCOUNTER — Ambulatory Visit (HOSPITAL_BASED_OUTPATIENT_CLINIC_OR_DEPARTMENT_OTHER)
Admission: RE | Admit: 2024-02-08 | Discharge: 2024-02-08 | Disposition: A | Discharge status: Home / Self Care | Source: Ambulatory Visit | Attending: Cardiovascular Disease | Admitting: Cardiovascular Disease

## 2024-02-08 ENCOUNTER — Encounter (HOSPITAL_COMMUNITY): Payer: Self-pay

## 2024-02-08 DIAGNOSIS — I351 Nonrheumatic aortic (valve) insufficiency: Secondary | ICD-10-CM | POA: Insufficient documentation

## 2024-02-08 LAB — ECHOCARDIOGRAM COMPLETE
AR max vel: 2.02 cm2
AV Area VTI: 2.04 cm2
AV Area mean vel: 2.14 cm2
AV Mean grad: 13 mmHg
AV Peak grad: 25.6 mmHg
Ao pk vel: 2.53 m/s
Area-P 1/2: 3.48 cm2
Calc EF: 53.3 %
S' Lateral: 3.35 cm
Single Plane A2C EF: 55.5 %
Single Plane A4C EF: 49 %

## 2024-02-08 NOTE — Telephone Encounter (Signed)
 Augustin calling back  per Dr Anner orders to give update of patient   Per Augustin - patient is feeling a little better, and able to ambulate the wheelchair without assistance,  Blood pressure siting was 109/56 after standing 95/51    The information was given to Dr Anner

## 2024-02-08 NOTE — Progress Notes (Signed)
 Patient ID: Jordan Oconnell, male   DOB: Apr 27, 1940, 84 y.o.   MRN: [id number]  Patient presented for echocardiogram appearing very lethargic and fatigued. Required wheelchair assistance upon arrival. Initial blood pressure was 85/52 mmHg. Dr. Anner (DOD2) was notified and advised reassessment following completion of the echocardiogram; if hypotension and symptoms persisted, EMS was to be contacted for transport to the ER.  Following the echocardiogram, patient reported significant improvement in symptoms and was able to ambulate down the hallway independently. Repeat blood pressure measurements were 109/56 mmHg while sitting and 95/51 mmHg while standing. Dr. Anner was updated regarding the patients improvement. Patient was discharged in stable condition. Per Dr. Sibyl recommendation, patient was instructed to reduce amlodipine  dose by half.  Augustin Seals RDCS

## 2024-02-08 NOTE — Telephone Encounter (Signed)
 Augustin called and spoke to Dr Anner  ( DOD 2)  Concerning patient @ 10:30am   Dr Anner relayed  the below message: Jordan Oconnell & Jordan Oconnell -- just a call about this gentleman by Echo Tech here @ [facility] --> very lethargic & fatigued. BP in 80s. They are doing Echo now, but with BP that low not safe for him to drive home (is here alone). Recommended that they reassess after Echo & if still low BP / symptomatic, should call EMS to take to ER. Probably needs IVF & maybe lower dose of Amlodipine .

## 2024-02-09 ENCOUNTER — Telehealth: Payer: Self-pay

## 2024-02-09 ENCOUNTER — Observation Stay (HOSPITAL_COMMUNITY)

## 2024-02-09 ENCOUNTER — Inpatient Hospital Stay (HOSPITAL_COMMUNITY)
Admission: AD | Admit: 2024-02-09 | Discharge: 2024-02-23 | DRG: 812 | Disposition: A | Discharge status: Home Health | Attending: Internal Medicine | Admitting: Internal Medicine

## 2024-02-09 ENCOUNTER — Encounter (HOSPITAL_COMMUNITY): Payer: Self-pay

## 2024-02-09 ENCOUNTER — Encounter (HOSPITAL_COMMUNITY): Payer: Self-pay | Admitting: Internal Medicine

## 2024-02-09 DIAGNOSIS — D7589 Other specified diseases of blood and blood-forming organs: Secondary | ICD-10-CM | POA: Diagnosis present

## 2024-02-09 DIAGNOSIS — I480 Paroxysmal atrial fibrillation: Secondary | ICD-10-CM | POA: Diagnosis not present

## 2024-02-09 DIAGNOSIS — C774 Secondary and unspecified malignant neoplasm of inguinal and lower limb lymph nodes: Secondary | ICD-10-CM | POA: Diagnosis present

## 2024-02-09 DIAGNOSIS — E039 Hypothyroidism, unspecified: Secondary | ICD-10-CM | POA: Diagnosis not present

## 2024-02-09 DIAGNOSIS — I495 Sick sinus syndrome: Secondary | ICD-10-CM | POA: Diagnosis not present

## 2024-02-09 DIAGNOSIS — I10 Essential (primary) hypertension: Secondary | ICD-10-CM | POA: Diagnosis not present

## 2024-02-09 DIAGNOSIS — I2510 Atherosclerotic heart disease of native coronary artery without angina pectoris: Secondary | ICD-10-CM | POA: Diagnosis present

## 2024-02-09 DIAGNOSIS — Z79899 Other long term (current) drug therapy: Secondary | ICD-10-CM

## 2024-02-09 DIAGNOSIS — D62 Acute posthemorrhagic anemia: Principal | ICD-10-CM | POA: Diagnosis present

## 2024-02-09 DIAGNOSIS — I350 Nonrheumatic aortic (valve) stenosis: Secondary | ICD-10-CM

## 2024-02-09 DIAGNOSIS — K8020 Calculus of gallbladder without cholecystitis without obstruction: Secondary | ICD-10-CM | POA: Diagnosis present

## 2024-02-09 DIAGNOSIS — Z85528 Personal history of other malignant neoplasm of kidney: Secondary | ICD-10-CM

## 2024-02-09 DIAGNOSIS — R5381 Other malaise: Secondary | ICD-10-CM | POA: Diagnosis present

## 2024-02-09 DIAGNOSIS — N184 Chronic kidney disease, stage 4 (severe): Secondary | ICD-10-CM | POA: Diagnosis not present

## 2024-02-09 DIAGNOSIS — I739 Peripheral vascular disease, unspecified: Secondary | ICD-10-CM | POA: Insufficient documentation

## 2024-02-09 DIAGNOSIS — Z7901 Long term (current) use of anticoagulants: Secondary | ICD-10-CM | POA: Diagnosis not present

## 2024-02-09 DIAGNOSIS — R310 Gross hematuria: Secondary | ICD-10-CM | POA: Diagnosis present

## 2024-02-09 DIAGNOSIS — E032 Hypothyroidism due to medicaments and other exogenous substances: Secondary | ICD-10-CM | POA: Diagnosis present

## 2024-02-09 DIAGNOSIS — Z952 Presence of prosthetic heart valve: Secondary | ICD-10-CM

## 2024-02-09 DIAGNOSIS — N2889 Other specified disorders of kidney and ureter: Secondary | ICD-10-CM | POA: Diagnosis present

## 2024-02-09 DIAGNOSIS — I252 Old myocardial infarction: Secondary | ICD-10-CM

## 2024-02-09 DIAGNOSIS — E785 Hyperlipidemia, unspecified: Secondary | ICD-10-CM | POA: Diagnosis present

## 2024-02-09 DIAGNOSIS — I472 Ventricular tachycardia, unspecified: Secondary | ICD-10-CM | POA: Diagnosis not present

## 2024-02-09 DIAGNOSIS — C679 Malignant neoplasm of bladder, unspecified: Secondary | ICD-10-CM | POA: Diagnosis present

## 2024-02-09 DIAGNOSIS — I5042 Chronic combined systolic (congestive) and diastolic (congestive) heart failure: Secondary | ICD-10-CM | POA: Diagnosis not present

## 2024-02-09 DIAGNOSIS — Z8041 Family history of malignant neoplasm of ovary: Secondary | ICD-10-CM

## 2024-02-09 DIAGNOSIS — F329 Major depressive disorder, single episode, unspecified: Secondary | ICD-10-CM | POA: Diagnosis not present

## 2024-02-09 DIAGNOSIS — Z8551 Personal history of malignant neoplasm of bladder: Secondary | ICD-10-CM

## 2024-02-09 DIAGNOSIS — Z950 Presence of cardiac pacemaker: Secondary | ICD-10-CM

## 2024-02-09 DIAGNOSIS — I442 Atrioventricular block, complete: Secondary | ICD-10-CM | POA: Diagnosis not present

## 2024-02-09 DIAGNOSIS — N400 Enlarged prostate without lower urinary tract symptoms: Secondary | ICD-10-CM | POA: Diagnosis present

## 2024-02-09 DIAGNOSIS — Z87442 Personal history of urinary calculi: Secondary | ICD-10-CM

## 2024-02-09 DIAGNOSIS — N179 Acute kidney failure, unspecified: Secondary | ICD-10-CM | POA: Diagnosis present

## 2024-02-09 DIAGNOSIS — K219 Gastro-esophageal reflux disease without esophagitis: Secondary | ICD-10-CM | POA: Diagnosis not present

## 2024-02-09 DIAGNOSIS — C787 Secondary malignant neoplasm of liver and intrahepatic bile duct: Secondary | ICD-10-CM | POA: Diagnosis not present

## 2024-02-09 DIAGNOSIS — Z936 Other artificial openings of urinary tract status: Secondary | ICD-10-CM

## 2024-02-09 DIAGNOSIS — Z602 Problems related to living alone: Secondary | ICD-10-CM | POA: Diagnosis present

## 2024-02-09 DIAGNOSIS — C671 Malignant neoplasm of dome of bladder: Secondary | ICD-10-CM | POA: Diagnosis not present

## 2024-02-09 DIAGNOSIS — T462X5A Adverse effect of other antidysrhythmic drugs, initial encounter: Secondary | ICD-10-CM | POA: Diagnosis present

## 2024-02-09 DIAGNOSIS — I4892 Unspecified atrial flutter: Secondary | ICD-10-CM | POA: Diagnosis present

## 2024-02-09 DIAGNOSIS — K439 Ventral hernia without obstruction or gangrene: Secondary | ICD-10-CM | POA: Diagnosis present

## 2024-02-09 DIAGNOSIS — C661 Malignant neoplasm of right ureter: Secondary | ICD-10-CM | POA: Diagnosis not present

## 2024-02-09 DIAGNOSIS — Z905 Acquired absence of kidney: Secondary | ICD-10-CM

## 2024-02-09 DIAGNOSIS — T8386XA Thrombosis of genitourinary prosthetic devices, implants and grafts, initial encounter: Secondary | ICD-10-CM | POA: Diagnosis present

## 2024-02-09 DIAGNOSIS — Z800 Family history of malignant neoplasm of digestive organs: Secondary | ICD-10-CM

## 2024-02-09 DIAGNOSIS — I130 Hypertensive heart and chronic kidney disease with heart failure and stage 1 through stage 4 chronic kidney disease, or unspecified chronic kidney disease: Secondary | ICD-10-CM | POA: Diagnosis present

## 2024-02-09 DIAGNOSIS — Y831 Surgical operation with implant of artificial internal device as the cause of abnormal reaction of the patient, or of later complication, without mention of misadventure at the time of the procedure: Secondary | ICD-10-CM | POA: Diagnosis present

## 2024-02-09 DIAGNOSIS — Z880 Allergy status to penicillin: Secondary | ICD-10-CM

## 2024-02-09 DIAGNOSIS — I340 Nonrheumatic mitral (valve) insufficiency: Secondary | ICD-10-CM | POA: Diagnosis present

## 2024-02-09 DIAGNOSIS — Z89421 Acquired absence of other right toe(s): Secondary | ICD-10-CM

## 2024-02-09 DIAGNOSIS — Z881 Allergy status to other antibiotic agents status: Secondary | ICD-10-CM

## 2024-02-09 DIAGNOSIS — Z888 Allergy status to other drugs, medicaments and biological substances status: Secondary | ICD-10-CM

## 2024-02-09 DIAGNOSIS — Z803 Family history of malignant neoplasm of breast: Secondary | ICD-10-CM

## 2024-02-09 DIAGNOSIS — Z89422 Acquired absence of other left toe(s): Secondary | ICD-10-CM

## 2024-02-09 DIAGNOSIS — Z79890 Hormone replacement therapy (postmenopausal): Secondary | ICD-10-CM

## 2024-02-09 DIAGNOSIS — Z860100 Personal history of colon polyps, unspecified: Secondary | ICD-10-CM

## 2024-02-09 DIAGNOSIS — Z7982 Long term (current) use of aspirin: Secondary | ICD-10-CM

## 2024-02-09 HISTORY — DX: Left bundle-branch block, unspecified: I44.7

## 2024-02-09 HISTORY — DX: Atherosclerotic heart disease of native coronary artery without angina pectoris: I25.10

## 2024-02-09 HISTORY — DX: Sick sinus syndrome: I49.5

## 2024-02-09 HISTORY — DX: Other acidosis: E87.29

## 2024-02-09 LAB — CBC
HCT: 18.8 % — ABNORMAL LOW (ref 39.0–52.0)
Hemoglobin: 5.5 g/dL — CL (ref 13.0–17.0)
MCH: 33.1 pg (ref 26.0–34.0)
MCHC: 29.3 g/dL — ABNORMAL LOW (ref 30.0–36.0)
MCV: 113.3 fL — ABNORMAL HIGH (ref 80.0–100.0)
Platelets: 95 K/uL — ABNORMAL LOW (ref 150–400)
RBC: 1.66 MIL/uL — ABNORMAL LOW (ref 4.22–5.81)
RDW: 17.3 % — ABNORMAL HIGH (ref 11.5–15.5)
WBC: 3.8 K/uL — ABNORMAL LOW (ref 4.0–10.5)
nRBC: 0 % (ref 0.0–0.2)

## 2024-02-09 LAB — COMPREHENSIVE METABOLIC PANEL WITH GFR
ALT: 12 U/L (ref 0–44)
AST: 17 U/L (ref 15–41)
Albumin: 3.6 g/dL (ref 3.5–5.0)
Alkaline Phosphatase: 43 U/L (ref 38–126)
Anion gap: 14 (ref 5–15)
BUN: 64 mg/dL — ABNORMAL HIGH (ref 8–23)
CO2: 19 mmol/L — ABNORMAL LOW (ref 22–32)
Calcium: 8.5 mg/dL — ABNORMAL LOW (ref 8.9–10.3)
Chloride: 105 mmol/L (ref 98–111)
Creatinine, Ser: 4.28 mg/dL — ABNORMAL HIGH (ref 0.61–1.24)
GFR, Estimated: 13 mL/min — ABNORMAL LOW
Glucose, Bld: 207 mg/dL — ABNORMAL HIGH (ref 70–99)
Potassium: 4.6 mmol/L (ref 3.5–5.1)
Sodium: 137 mmol/L (ref 135–145)
Total Bilirubin: 0.3 mg/dL (ref 0.0–1.2)
Total Protein: 5.6 g/dL — ABNORMAL LOW (ref 6.5–8.1)

## 2024-02-09 LAB — URINALYSIS, ROUTINE W REFLEX MICROSCOPIC

## 2024-02-09 LAB — PROTIME-INR
INR: 1.2 (ref 0.8–1.2)
Prothrombin Time: 15.7 s — ABNORMAL HIGH (ref 11.4–15.2)

## 2024-02-09 LAB — IRON AND TIBC
Iron: 27 ug/dL — ABNORMAL LOW (ref 45–182)
Saturation Ratios: 9 % — ABNORMAL LOW (ref 17.9–39.5)
TIBC: 301 ug/dL (ref 250–450)
UIBC: 274 ug/dL

## 2024-02-09 LAB — RETICULOCYTES
Immature Retic Fract: 43 % — ABNORMAL HIGH (ref 2.3–15.9)
RBC.: 1.64 MIL/uL — ABNORMAL LOW (ref 4.22–5.81)
Retic Count, Absolute: 145.3 K/uL (ref 19.0–186.0)
Retic Ct Pct: 8.9 % — ABNORMAL HIGH (ref 0.4–3.1)

## 2024-02-09 LAB — PREPARE RBC (CROSSMATCH)

## 2024-02-09 LAB — URINALYSIS, MICROSCOPIC (REFLEX)
RBC / HPF: >50 RBC/hpf (ref 0–5)
Squamous Epithelial / HPF: NONE SEEN /HPF (ref 0–5)

## 2024-02-09 LAB — FERRITIN: Ferritin: 67 ng/mL (ref 24–336)

## 2024-02-09 LAB — MAGNESIUM: Magnesium: 2.3 mg/dL (ref 1.7–2.4)

## 2024-02-09 LAB — FOLATE: Folate: 13.8 ng/mL

## 2024-02-09 LAB — VITAMIN B12: Vitamin B-12: 2174 pg/mL — ABNORMAL HIGH (ref 180–914)

## 2024-02-09 MED ORDER — ALPRAZOLAM 0.25 MG PO TABS
0.2500 mg | ORAL_TABLET | Freq: Every day | ORAL | Status: DC
  Administered 2024-02-09 – 2024-02-22 (×14): 0.25 mg via ORAL
  Filled 2024-02-09 (×14): qty 1

## 2024-02-09 MED ORDER — SODIUM CHLORIDE 0.9 % IV SOLN: INTRAVENOUS | Status: AC

## 2024-02-09 MED ORDER — ACETAMINOPHEN 650 MG RE SUPP: 650.0000 mg | Freq: Four times a day (QID) | RECTAL | Status: DC | PRN

## 2024-02-09 MED ORDER — ATORVASTATIN CALCIUM 10 MG PO TABS
10.0000 mg | ORAL_TABLET | Freq: Every day | ORAL | Status: DC
  Administered 2024-02-10 – 2024-02-22 (×13): 10 mg via ORAL
  Filled 2024-02-09 (×13): qty 1

## 2024-02-09 MED ORDER — AMIODARONE HCL 200 MG PO TABS
200.0000 mg | ORAL_TABLET | Freq: Every day | ORAL | Status: DC
  Administered 2024-02-10 – 2024-02-23 (×14): 200 mg via ORAL
  Filled 2024-02-09 (×14): qty 1

## 2024-02-09 MED ORDER — ASPIRIN 81 MG PO TBEC: 81.0000 mg | DELAYED_RELEASE_TABLET | Freq: Every day | ORAL | Status: DC

## 2024-02-09 MED ORDER — ISOSORBIDE MONONITRATE ER 30 MG PO TB24
30.0000 mg | ORAL_TABLET | Freq: Every day | ORAL | Status: DC
  Administered 2024-02-10 – 2024-02-23 (×14): 30 mg via ORAL
  Filled 2024-02-09 (×14): qty 1

## 2024-02-09 MED ORDER — ACETAMINOPHEN 325 MG PO TABS
650.0000 mg | ORAL_TABLET | Freq: Four times a day (QID) | ORAL | Status: DC | PRN
  Filled 2024-02-09: qty 2

## 2024-02-09 MED ORDER — CARVEDILOL 12.5 MG PO TABS
12.5000 mg | ORAL_TABLET | Freq: Two times a day (BID) | ORAL | Status: DC
  Administered 2024-02-09 – 2024-02-23 (×28): 12.5 mg via ORAL
  Filled 2024-02-09 (×28): qty 1

## 2024-02-09 MED ORDER — POLYETHYLENE GLYCOL 3350 17 G PO PACK: 17.0000 g | PACK | Freq: Every day | ORAL | Status: DC | PRN

## 2024-02-09 MED ORDER — AMLODIPINE BESYLATE 10 MG PO TABS: 10.0000 mg | ORAL_TABLET | Freq: Every day | ORAL | Status: DC

## 2024-02-09 MED ORDER — SODIUM CHLORIDE 0.9% IV SOLUTION: Freq: Once | INTRAVENOUS | Status: AC

## 2024-02-09 MED ORDER — MIRTAZAPINE 15 MG PO TABS
7.5000 mg | ORAL_TABLET | Freq: Every day | ORAL | Status: DC
  Administered 2024-02-09 – 2024-02-22 (×14): 7.5 mg via ORAL
  Filled 2024-02-09 (×14): qty 1

## 2024-02-09 MED ORDER — ENSURE ENLIVE PO LIQD
237.0000 mL | Freq: Two times a day (BID) | ORAL | Status: DC
  Administered 2024-02-12 (×2): 237 mL via ORAL
  Filled 2024-02-09 (×6): qty 237

## 2024-02-09 MED ORDER — LEVOTHYROXINE SODIUM 100 MCG PO TABS: 100.0000 ug | ORAL_TABLET | Freq: Every day | ORAL | Status: DC

## 2024-02-09 MED ORDER — SODIUM CHLORIDE 0.9% FLUSH
3.0000 mL | Freq: Two times a day (BID) | INTRAVENOUS | Status: DC
  Administered 2024-02-09 – 2024-02-23 (×21): 3 mL via INTRAVENOUS

## 2024-02-09 NOTE — Telephone Encounter (Signed)
 I was able to reach the patient.  His hemoglobin is down 3 points.  He does report hematuria this been going on for some time.  He does have a urostomy.  He is currently asymptomatic although does report ongoing fatigue for the last few weeks.  I recommended that he contact his urologist, who is at [facility], first thing in the morning.  Patient acknowledged understanding.

## 2024-02-09 NOTE — Telephone Encounter (Signed)
 Received a critical result this evening that Mr. Aderhold hemoglobin is 6.9.  This may be related to his symptoms noted at his recent outpatient echo.  I attempted to contact the patient twice without a response.  I left a voicemail recommending that he contact his physicians first thing in the morning.  Will route this to Dr.Croitoru.

## 2024-02-09 NOTE — Consult Note (Addendum)
 "  Cardiology Consultation   Patient ID: Jordan Oconnell MRN: [id number]; DOB: 1940-03-25  Admit date: 02/09/2024 Date of Consult: 02/09/2024  PCP:  Shepard Ade, MD   [facility] Providers Cardiologist:  Jerel Balding, MD  Electrophysiologist:  OLE ONEIDA HOLTS, MD (Inactive)       Patient Profile: Jordan Oconnell is a 84 y.o. male with a hx of aortic stenosis s/p TAVR 01/2021, chronic HFmrEF (EF ranging 45-55%), tachy-brady syndrome s/p dual chamber pacemaker (BSC Accolade with His bundle lead, 01/2021), paroxysmal atrial fibrillation/flutter on amiodarone , moderate CAD by cath 2022 with NSTEMI 02/2023 in setting of AKI/osteomyelitis managed medically given CKD, LBBB, CKD stage 4 (baseline creatinine, 2.7-3.0), extensive urothelial cancer and renal cell carcinoma s/p partial R nephrectomy, complete left nephrectomy, complete cystectomy with urostomy), chronic hyperchloremic metabolic acidosis, moderate aortic root aneurysm (46 mm by CT October 2022 but not seen on echo 01/2024), 2.5cm right iliac aneurysm by CT 2024, hypothyroidism, anemia who is being seen 02/09/2024 for the evaluation of hypotension and hematuria in the setting of anticoagulation at the request of Dr. Seena.  History of Present Illness: Mr. Rundle follows with Dr. Balding and previously Dr. Holts in setting of above complex history. EF has been variable at times in 45% range but last study performed yesterday as outpatient showed EF 50-55%, moderate LVH, G1DD, mildly enlarged RV, moderately enlarged LA, mild MR, normal TAVR structure/function. Per notes, when he presented for the echo he appeared very fatigued and lethargic, initial BP 85/52. Dr. Anner was notified and advised reassessment following echocardiogram after which the patient reported significant improvement and was able to ambulate. Repeat blood pressure measurements were 109/56 mmHg while sitting and 95/51 mmHg while standing. MD advised  decreasing amlodipine  by half. He had labs performed showing critical Hgb of 6.9 (previously 12.7 in 07/2023), continued thrombocytopenia (improved plt 132 as previously 74 in 07/2023), TSH 20.30, free T4 wnl, AKI with Cr 3.79 up from 3.35 in 10/2023. He also reported hematuria. Direct admission was recommended therefore presented to [facility] for this today, with hospitalist team to admit and cardiology for consultation. Home cardiac med regimen includes amiodarone  200mg  daily, amlodipine  10mg  daily, ASA 81mg  daily, carvedilol  12.5mg  BID, Eliquis  2.5mg  BID, hydralazine  25mg  BID, Imdur  30mg  daily. He took all home meds today except did half of the amlodipine .  He went to [place]  to visit late Nov/early December and felt well doing so. However, gradually since that time he has noticed progressive exertional fatigue, dyspnea, and feeling wasted. He used to walk 3 miles a day but found even a mile would leave him exhausted. He has also had concomitant episodic pink hematuria since that time. He trialed a course of Cipro  without relief, still on it presently. No chest pain, orthopnea, PND, edema, palpitations, or syncope. Labs pending for admission.   Past Medical History:  Diagnosis Date   Acute on chronic systolic CHF (congestive heart failure) (HCC) 03/13/2022   Acute renal failure superimposed on stage 4 chronic kidney disease (HCC) 03/13/2022   Atrial fibrillation with rapid ventricular response (HCC) 03/11/2022   Bladder cancer (HCC)    BPH (benign prostatic hyperplasia)    CAD (coronary artery disease)    CKD (chronic kidney disease), stage IV (HCC)    Colon polyps    Diverticulosis    GERD (gastroesophageal reflux disease)    Hyperchloremic metabolic acidosis    Hyperlipidemia    pt denies   Hypertension    LBBB (left bundle branch block)  Osteomyelitis of left foot (HCC) 04/26/2023   PAF (paroxysmal atrial fibrillation) (HCC)    Presence of permanent cardiac pacemaker    Renal cell  carcinoma (HCC) 2008   left   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 Wonda and Dr. Lucas   Severe aortic stenosis 10/05/2020   Tachy-brady syndrome (HCC)    Toe pain, left 04/15/2023    Past Surgical History:  Procedure Laterality Date   AMPUTATION TOE Right 03/02/2023   Procedure: AMPUTATION TOE, 4th;  Surgeon: Barton Drape, MD;  Location: [facility];  Service: Orthopedics;  Laterality: Right;   AMPUTATION TOE Left 05/04/2023   Procedure: AMPUTATION, TOE;  Surgeon: Barton Drape, MD;  Location: [facility];  Service: Orthopedics;  Laterality: Left;  left foot 4th toe metatarsalphalangeal joint disarticulation   APPENDECTOMY     BLADDER SURGERY  01/25/2006   transurethral resection/resection of prostatic urethra   BOWEL RESECTION     CARDIOVERSION N/A 03/03/2022   Procedure: CARDIOVERSION;  Surgeon: Lonni Slain, MD;  Location: [facility] ENDOSCOPY;  Service: Cardiovascular;  Laterality: N/A;   CARDIOVERSION N/A 03/12/2022   Procedure: CARDIOVERSION;  Surgeon: Sheena Pugh, DO;  Location: [facility] ENDOSCOPY;  Service: Cardiovascular;  Laterality: N/A;   ESOPHAGOGASTRODUODENOSCOPY N/A 07/24/2013   Procedure: ESOPHAGOGASTRODUODENOSCOPY (EGD);  Surgeon: Norleen LOISE Kiang, MD;  Location: [facility];  Service: Endoscopy;  Laterality: N/A;   INTRAOPERATIVE TRANSTHORACIC ECHOCARDIOGRAM N/A 01/27/2021   Procedure: INTRAOPERATIVE TRANSTHORACIC ECHOCARDIOGRAM;  Surgeon: Wonda Sharper, MD;  Location: [facility] INVASIVE CV LAB;  Service: Open Heart Surgery;  Laterality: N/A;   IR FLUORO GUIDE CV LINE RIGHT  03/04/2023   IR REMOVAL TUN CV CATH W/O FL  05/09/2023   IR US  GUIDE VASC ACCESS RIGHT  03/04/2023   laparoscopic surgery  01/25/2010   laser   (?)   NEPHRECTOMY  01/25/2005   partial, left   NEPHROSTOMY  01/25/2009   stent   NM MYOCAR PERF WALL MOTION  09/08/2010   Normal   PACEMAKER IMPLANT N/A 02/12/2021    Procedure: PACEMAKER IMPLANT;  Surgeon: Cindie Ole DASEN, MD;  Location: [facility] INVASIVE CV LAB;  Service: Cardiovascular;  Laterality: N/A;   RIGHT HEART CATH AND CORONARY ANGIOGRAPHY N/A 10/06/2020   Procedure: RIGHT HEART CATH AND CORONARY ANGIOGRAPHY;  Surgeon: Wonda Sharper, MD;  Location: [facility] INVASIVE CV LAB;  Service: Cardiovascular;  Laterality: N/A;   ROBOT ASSISTED LAPAROSCOPIC COMPLETE CYSTECT ILEAL CONDUIT     TRANSCATHETER AORTIC VALVE REPLACEMENT, TRANSFEMORAL Right 01/27/2021   Procedure: TRANSCATHETER AORTIC VALVE REPLACEMENT, TRANSFEMORAL;  Surgeon: Wonda Sharper, MD;  Location: [facility] INVASIVE CV LAB;  Service: Open Heart Surgery;  Laterality: Right;   US  ECHOCARDIOGRAPHY  09/08/2010   mild diastolic dysfunction,mild dilated LA,mild MR,AI,mildly dilated aortic root     Home Medications:  Prior to Admission medications  Medication Sig Start Date End Date Taking? Authorizing Provider  acetaminophen  (TYLENOL ) 500 MG tablet Take 1,000 mg by mouth See admin instructions. Take 1,000 mg by mouth in the morning and evening    [provider]  allopurinol  (ZYLOPRIM ) 100 MG tablet Take 1 tablet (100 mg total) by mouth daily. 03/12/23   Gherghe, Costin M, MD  ALPRAZolam  (XANAX ) 0.25 MG tablet Take 1 tablet (0.25 mg total) by mouth at bedtime. 05/12/23   Swayze, Ava, DO  amiodarone  (PACERONE ) 200 MG tablet Take 1 tablet by mouth once daily 01/27/24   Zona Pedro, Jerel, MD  amLODipine  (NORVASC )  10 MG tablet Take 1 tablet (10 mg total) by mouth daily. 03/12/23   Trixie Nilda HERO, MD  aspirin  EC 81 MG tablet Take 1 tablet (81 mg total) by mouth daily. Swallow whole. 03/12/23   Gherghe, Costin M, MD  atorvastatin  (LIPITOR) 10 MG tablet Take 1 tablet (10 mg total) by mouth daily. Patient taking differently: Take 10 mg by mouth at bedtime. 03/12/23   Gherghe, Costin M, MD  bisacodyl  (DULCOLAX) 5 MG EC tablet Take 1 tablet (5 mg total) by mouth daily. Hold for 24 hours if more than 2 loose bowel  movements in one day. 05/11/23   Swayze, Ava, DO  carvedilol  (COREG ) 12.5 MG tablet Take 12.5 mg by mouth 2 (two) times daily.    [provider]  cefadroxil  (DURICEF) 500 MG capsule Take 1 capsule (500 mg total) by mouth 2 (two) times daily. 05/11/23   Swayze, Ava, DO  cyanocobalamin  1000 MCG tablet Take 1 tablet (1,000 mcg total) by mouth daily. 03/12/23   Gherghe, Costin M, MD  docusate sodium  (COLACE) 100 MG capsule Take 1 capsule (100 mg total) by mouth daily. 05/11/23   Swayze, Ava, DO  ELIQUIS  2.5 MG TABS tablet Take 1 tablet by mouth twice daily 01/05/24   Moataz Tavis, MD  feeding supplement (ENSURE ENLIVE / ENSURE PLUS) LIQD Take 237 mLs by mouth 2 (two) times daily between meals. 05/11/23   Swayze, Ava, DO  gentamicin  cream (GARAMYCIN ) 0.1 % Apply 1 Application topically at bedtime. 09/15/23   [provider]  hydrALAZINE  (APRESOLINE ) 25 MG tablet Take 1 tablet (25 mg total) by mouth 2 (two) times daily. 03/12/23   Gherghe, Costin M, MD  isosorbide  mononitrate (IMDUR ) 30 MG 24 hr tablet Take 1 tablet (30 mg total) by mouth daily. 03/12/23   Gherghe, Costin M, MD  levothyroxine  (SYNTHROID ) 75 MCG tablet Take 75 mcg by mouth daily before breakfast.    [provider]  linezolid  (ZYVOX ) 600 MG tablet Take 1 tablet (600 mg total) by mouth every 12 (twelve) hours. 05/11/23   Swayze, Ava, DO  mirtazapine  (REMERON ) 15 MG tablet Take 7.5 mg by mouth at bedtime. 08/09/20   [provider]  nitroGLYCERIN  (NITROSTAT ) 0.4 MG SL tablet 1 tablet under the tongue and allow to dissolve as needed. Take every 5 minutes up to 3 times if chest pain persists 02/03/24     nutrition supplement, JUVEN, (JUVEN) PACK Take 1 packet by mouth 2 (two) times daily with a meal. 05/11/23   Swayze, Ava, DO  oxyCODONE  (OXY IR/ROXICODONE ) 5 MG immediate release tablet Take 1 tablet (5 mg total) by mouth every 6 (six) hours as needed for moderate pain (pain score 4-6). 05/12/23   Swayze, Ava, DO   sodium bicarbonate  650 MG tablet Take 1,300 mg by mouth See admin instructions. Take 1,300 mg by mouth in the morning and evening 09/01/20   [provider]    Scheduled Meds:  sodium chloride    Intravenous Once   ALPRAZolam   0.25 mg Oral QHS   [START ON 02/10/2024] amiodarone   200 mg Oral Daily   [START ON 02/10/2024] aspirin  EC  81 mg Oral Daily   atorvastatin   10 mg Oral QHS   carvedilol   12.5 mg Oral BID   [START ON 02/10/2024] feeding supplement  237 mL Oral BID BM   [START ON 02/10/2024] isosorbide  mononitrate  30 mg Oral Daily   [START ON 02/10/2024] levothyroxine   100 mcg Oral QAC breakfast   mirtazapine   7.5 mg Oral QHS   sodium chloride  flush  3 mL Intravenous Q12H   Continuous Infusions:  PRN Meds: acetaminophen  **OR** acetaminophen , polyethylene glycol  Allergies:   Allergies[1]  Social History:   Social History   Socioeconomic History   Marital status: Divorced    Spouse name: Not on file   Number of children: 2   Years of education: Not on file   Highest education level: Not on file  Occupational History   Occupation: runner, broadcasting/film/video  Tobacco Use   Smoking status: Never   Smokeless tobacco: Never  Vaping Use   Vaping status: Never Used  Substance and Sexual Activity   Alcohol  use: Not Currently    Comment: occasionally   Drug use: No   Sexual activity: Not on file  Other Topics Concern   Not on file  Social History Narrative   Not on file   Social Drivers of Health   Tobacco Use: Low Risk (11/23/2023)   Patient History    Smoking Tobacco Use: Never    Smokeless Tobacco Use: Never    Passive Exposure: Not on file  Financial Resource Strain: Not on file  Food Insecurity: No Food Insecurity (05/03/2023)   Hunger Vital Sign    Worried About Running Out of Food in the Last Year: Never true    Ran Out of Food in the Last Year: Never true  Transportation Needs: No Transportation Needs (05/03/2023)   PRAPARE - Administrator, Civil Service  (Medical): No    Lack of Transportation (Non-Medical): No  Physical Activity: Not on file  Stress: Not on file  Social Connections: Unknown (05/03/2023)   Social Connection and Isolation Panel    Frequency of Communication with Friends and Family: Twice a week    Frequency of Social Gatherings with Friends and Family: Twice a week    Attends Religious Services: Patient declined    Database Administrator or Organizations: No    Attends Engineer, Structural: Never    Marital Status: Patient declined  Recent Concern: Social Connections - Moderately Isolated (03/12/2023)   Social Connection and Isolation Panel    Frequency of Communication with Friends and Family: More than three times a week    Frequency of Social Gatherings with Friends and Family: Once a week    Attends Religious Services: Never    Database Administrator or Organizations: No    Attends Banker Meetings: Never    Marital Status: Living with partner  Intimate Partner Violence: Not At Risk (05/03/2023)   Humiliation, Afraid, Rape, and Kick questionnaire    Fear of Current or Ex-Partner: No    Emotionally Abused: No    Physically Abused: No    Sexually Abused: No  Depression (PHQ2-9): Not on file  Alcohol  Screen: Not on file  Housing: Low Risk (05/03/2023)   Housing Stability Vital Sign    Unable to Pay for Housing in the Last Year: No    Number of Times Moved in the Last Year: 0    Homeless in the Last Year: No  Utilities: Not At Risk (05/03/2023)   AHC Utilities    Threatened with loss of utilities: No  Health Literacy: Not on file    Family History:   Family History  Problem Relation Age of Onset   Ovarian cancer Mother    Colon cancer Mother    Colon cancer Father    Crohn's disease Son    Breast cancer Daughter  Colon cancer Brother      ROS:  Please see the history of present illness.   All other ROS reviewed and negative.     Physical Exam/Data: Vitals:   02/09/24 1612  BP:  (!) 129/59  Temp: (!) 97.5 F (36.4 C)  TempSrc: Oral  SpO2: 95%   No intake or output data in the 24 hours ending 02/09/24 1727    11/23/2023    3:30 PM 11/08/2023    8:38 AM 06/16/2023    1:50 PM  Last 3 Weights  Weight (lbs) 190 lb 9.6 oz 189 lb 9.6 oz 184 lb 11.2 oz  Weight (kg) 86.456 kg 86.002 kg 83.779 kg     There is no height or weight on file to calculate BMI.  General: Well developed, well nourished, in no acute distress. Head: Normocephalic, atraumatic, sclera non-icteric, no xanthomas, nares are without discharge. Neck: Negative for carotid bruits. JVP not elevated. Lungs: Clear bilaterally to auscultation without wheezes, rales, or rhonchi. Breathing is unlabored. Heart: RRR S1 S2 without murmurs, rubs, or gallops.  Abdomen: Soft, non-tender, non-distended with normoactive bowel sounds. No rebound/guarding. Extremities: No clubbing or cyanosis. No edema. Distal pedal pulses are 2+ and equal bilaterally. Neuro: Alert and oriented X 3. Moves all extremities spontaneously. Psych:  Responds to questions appropriately with a normal affect.   EKG:  The EKG was personally reviewed and demonstrates:  Atrial sensed, V paced rhythm with prolonged AV conduction, TWI I, avL Telemetry:  Telemetry was personally reviewed and demonstrates:  A sensed V paced rhythm  Relevant CV Studies: 2d echo 02/08/24  1. Left ventricular ejection fraction, by estimation, is 50 to 55%. The  left ventricle has low normal function. The left ventricle has no regional  wall motion abnormalities. There is moderate left ventricular hypertrophy.  Left ventricular diastolic  parameters are consistent with Grade I diastolic dysfunction (impaired  relaxation).   2. Right ventricular systolic function is normal. The right ventricular  size is mildly enlarged.   3. Left atrial size was moderately dilated.   4. The mitral valve is normal in structure. Mild mitral valve  regurgitation. No evidence of  mitral stenosis.   5. The aortic valve has been repaired/replaced. Aortic valve  regurgitation is not visualized. No aortic stenosis is present. There is a  29 mm Edwards Sapien prosthetic, stented Sapien prosthetic (TAVR) valve  present in the aortic position. Procedure  Date: 01/27/2021. Echo findings are consistent with normal structure and  function of the aortic valve prosthesis. Aortic valve mean gradient  measures 13.0 mmHg. Aortic valve Vmax measures 2.53 m/s.   6. The inferior vena cava is normal in size with greater than 50%  respiratory variability, suggesting right atrial pressure of 3 mmHg.   Laboratory Data: High Sensitivity Troponin:  No results for input(s): TROPONINIHS in the last 720 hours. No results for input(s): TRNPT in the last 720 hours.    Chemistry Recent Labs  Lab 02/08/24 1201  NA 142  K 4.8  CL 107*  CO2 15*  GLUCOSE 136*  BUN 59*  CREATININE 3.79*  CALCIUM  8.9    No results for input(s): PROT, ALBUMIN , AST, ALT, ALKPHOS, BILITOT in the last 168 hours. Lipids No results for input(s): CHOL, TRIG, HDL, LABVLDL, LDLCALC, CHOLHDL in the last 168 hours.  Hematology Recent Labs  Lab 02/08/24 1201  WBC 4.4  RBC 2.11*  HGB 6.9*  HCT 22.8*  MCV 108*  MCH 32.7  MCHC 30.3*  RDW 13.6  PLT 132*   Thyroid   Recent Labs  Lab 02/08/24 1201  TSH 20.300*  FREET4 1.16    BNP Recent Labs  Lab 02/08/24 1201  BNP WILL FOLLOW    DDimer No results for input(s): DDIMER in the last 168 hours.  Radiology/Studies:  ECHOCARDIOGRAM COMPLETE Result Date: 02/08/2024    ECHOCARDIOGRAM REPORT   Patient Name:   Derrion HARALD QUEVEDO Date of Exam: 02/08/2024 Medical Rec #:  [id number]         Height:       73.0 in Accession #:    [id number]        Weight:       190.6 lb Date of Birth:  Apr 15, 1940         BSA:          2.108 m Patient Age:    83 years          BP:           95/51 mmHg Patient Gender: M                 HR:           65 bpm. Exam  Location:  [address] Procedure: 2D Echo, Cardiac Doppler and Color Doppler (Both Spectral and Color            Flow Doppler were utilized during procedure). Indications:    I35.1 Nonrheumatic aortic (valve) insufficiency  History:        Patient has prior history of Echocardiogram examinations, most                 recent 03/06/2023. Risk Factors:Hypertension.                 Aortic Valve: 29 mm Edwards Sapien prosthetic, stented (TAVR)                 valve is present in the aortic position. Procedure Date:                 01/27/2021.  Sonographer:    Augustin Seals RDCS Referring Phys: [phone] Haylen Bellotti IMPRESSIONS  1. Left ventricular ejection fraction, by estimation, is 50 to 55%. The left ventricle has low normal function. The left ventricle has no regional wall motion abnormalities. There is moderate left ventricular hypertrophy. Left ventricular diastolic parameters are consistent with Grade I diastolic dysfunction (impaired relaxation).  2. Right ventricular systolic function is normal. The right ventricular size is mildly enlarged.  3. Left atrial size was moderately dilated.  4. The mitral valve is normal in structure. Mild mitral valve regurgitation. No evidence of mitral stenosis.  5. The aortic valve has been repaired/replaced. Aortic valve regurgitation is not visualized. No aortic stenosis is present. There is a 29 mm Edwards Sapien prosthetic, stented Sapien prosthetic (TAVR) valve present in the aortic position. Procedure Date: 01/27/2021. Echo findings are consistent with normal structure and function of the aortic valve prosthesis. Aortic valve mean gradient measures 13.0 mmHg. Aortic valve Vmax measures 2.53 m/s.  6. The inferior vena cava is normal in size with greater than 50% respiratory variability, suggesting right atrial pressure of 3 mmHg. FINDINGS  Left Ventricle: Left ventricular ejection fraction, by estimation, is 50 to 55%. The left ventricle has low normal function. The left  ventricle has no regional wall motion abnormalities. The left ventricular internal cavity size was normal in size. There is moderate left ventricular hypertrophy. Left ventricular diastolic parameters are consistent with Grade I diastolic dysfunction (  impaired relaxation). Right Ventricle: The right ventricular size is mildly enlarged. No increase in right ventricular wall thickness. Right ventricular systolic function is normal. Left Atrium: Left atrial size was moderately dilated. Right Atrium: Right atrial size was normal in size. Pericardium: There is no evidence of pericardial effusion. Mitral Valve: The mitral valve is normal in structure. Mild mitral valve regurgitation. No evidence of mitral valve stenosis. Tricuspid Valve: The tricuspid valve is normal in structure. Tricuspid valve regurgitation is trivial. No evidence of tricuspid stenosis. Aortic Valve: The aortic valve has been repaired/replaced. Aortic valve regurgitation is not visualized. No aortic stenosis is present. Aortic valve mean gradient measures 13.0 mmHg. Aortic valve peak gradient measures 25.6 mmHg. Aortic valve area, by VTI measures 2.04 cm. There is a 29 mm Edwards Sapien prosthetic, stented Sapien prosthetic, stented (TAVR) valve present in the aortic position. Procedure Date: 01/27/2021. Echo findings are consistent with normal structure and function of the aortic valve prosthesis. Pulmonic Valve: The pulmonic valve was normal in structure. Pulmonic valve regurgitation is not visualized. No evidence of pulmonic stenosis. Aorta: The aortic root is normal in size and structure. Venous: The inferior vena cava is normal in size with greater than 50% respiratory variability, suggesting right atrial pressure of 3 mmHg. IAS/Shunts: No atrial level shunt detected by color flow Doppler. Additional Comments: A device lead is visualized in the right ventricle.  LEFT VENTRICLE PLAX 2D LVIDd:         4.95 cm      Diastology LVIDs:         3.35 cm       LV e' medial:   5.92 cm/s LV PW:         1.30 cm      LV E/e' medial: 13.9 LV IVS:        1.45 cm LVOT diam:     2.40 cm LV SV:         108 LV SV Index:   51 LVOT Area:     4.52 cm  LV Volumes (MOD) LV vol d, MOD A2C: 128.0 ml LV vol d, MOD A4C: 99.6 ml LV vol s, MOD A2C: 56.9 ml LV vol s, MOD A4C: 50.8 ml LV SV MOD A2C:     71.1 ml LV SV MOD A4C:     99.6 ml LV SV MOD BP:      61.8 ml RIGHT VENTRICLE RV Basal diam:  4.50 cm     PULMONARY VEINS RV Mid diam:    3.60 cm     A Reversal Velocity: 32.30 cm/s RV S prime:     13.40 cm/s  Diastolic Velocity:  48.80 cm/s TAPSE (M-mode): 2.7 cm      S/D Velocity:        1.20                             Systolic Velocity:   60.30 cm/s LEFT ATRIUM             Index        RIGHT ATRIUM           Index LA diam:        4.60 cm 2.18 cm/m   RA Area:     17.60 cm LA Vol (A2C):   70.4 ml 33.40 ml/m  RA Volume:   45.90 ml  21.77 ml/m LA Vol (A4C):   86.4 ml 40.99 ml/m LA Biplane Vol: 81.2  ml 38.52 ml/m  AORTIC VALVE AV Area (Vmax):    2.02 cm AV Area (Vmean):   2.14 cm AV Area (VTI):     2.04 cm AV Vmax:           253.00 cm/s AV Vmean:          165.000 cm/s AV VTI:            0.527 m AV Peak Grad:      25.6 mmHg AV Mean Grad:      13.0 mmHg LVOT Vmax:         113.00 cm/s LVOT Vmean:        78.100 cm/s LVOT VTI:          0.238 m LVOT/AV VTI ratio: 0.45  AORTA Ao Root diam: 4.30 cm Ao Asc diam:  4.30 cm MITRAL VALVE MV Area (PHT): 3.48 cm     SHUNTS MV Decel Time: 218 msec     Systemic VTI:  0.24 m MV E velocity: 82.30 cm/s   Systemic Diam: 2.40 cm MV A velocity: 112.50 cm/s MV E/A ratio:  0.73 Oneil Parchment MD Electronically signed by Oneil Parchment MD Signature Date/Time: 02/08/2024/2:10:24 PM    Final      Assessment and Plan:  1. Symptomatic anemia, suspected due to hematuria in the setting of complex urologic history and concomitant Eliquis /aspirin  - Hgb 6.9 on labs yesterday - appreciate assistance from internal medicine team to help manage  2. HTN with hypotension  yesterday - will hold amlodipine , hydralazine  for now as a precaution given anemia, can review for restart in AM (took all home meds this AM) - otherwise continue carvedilol , Imdur  at present doses as long as BP allows  2. Paroxysmal atrial fibrillation/flutter with tachy-brady s/p PPM - on home amiodarone  200mg  daily, maintaining sinus (atrial sensed, V paced) - see thyroid  as below - Eliquis  on hold due to acute anemia and hematuria  4. Severe AS s/p TAVR - valve OK on echo yesterday  5. Chronic HFimpEF - echo yesterday as above - volume looks OK (outpatient BNP pending)  6. Moderate CAD by cath 2022 with NSTEMI  - hold ASA per d/w Dr. Francyne - continue home atorvastatin   7. Hypothyroidism - TSH 20.3 but free T4 OK - await MD/IM input  8. AKI on CKD 4 - suspect in setting of anemia, follow - avoid nephrotoxic agents   Risk Assessment/Risk Scores:      [place]  Heart Association (NYHA) Functional Class NYHA Class III  CHA2DS2-VASc Score = 5   This indicates a 7.2% annual risk of stroke. The patient's score is based upon: CHF History: 1 HTN History: 1 Diabetes History: 0 Stroke History: 0 Vascular Disease History: 1 Age Score: 2 Gender Score: 0       For questions or updates, please contact [facility] Please consult [email or web address] for contact info under      Signed, Raphael LOISE Bring, PA-C  02/09/2024 5:27 PM  I have seen and examined the patient along with Dayna N Dunn, PA-C .  I have reviewed the chart, notes and new data.  I agree with PA/NP's note.  Key new complaints: big complaint is fatigue; he has had pink hematuria for roughly 1 month.  He has not had chest pain or frank dyspnea, definitely no orthopnea or PND and denies lower extremity edema Key examination changes: Slightly pale, clear lungs, regular rate and rhythm, faint aortic ejection murmur, no peripheral edema Key new findings /  data: Severe anemia, marked reduction from baseline  hemoglobin above 12 in July.  Moderate worsening of his chronic kidney disease related to previous total left nephrectomy and partial right nephrectomy for urothelial cancer.  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  He is in normal rhythm (atrial sensed, ventricular paced).  Normal pacemaker function by most recent download in November 2025.  He has a very low burden of atrial fibrillation (none was detected throughout the year 2025, and in 2024 less than 1%, last documented meaningful episode of atrial fibrillation was a 4-hour event in August 2024).  Note moderately elevated TSH around 20, on chronic amiodarone  therapy  PLAN: Symptomatic severe anemia due to hematuria (unclear if this is acute blood loss anemia or iron deficiency or combination of the 2). Cardiac symptoms appear to be caused by the severe anemia and not any change in his structural cardiac problems or his rhythm issues. Will stop aspirin  and Eliquis .  Embolic stroke risk is quite low since he has not had atrial fibrillation in over 16 months.  Continue amiodarone  and carvedilol . Currently has normal blood pressure, but it is reasonable to hold his amlodipine  and hydralazine  at least until he gets a transfusion and will make sure that he is not actually having active hematuria. I recently recommended the increase of his dose of levothyroxine  from 75 mcg to 100 mcg.  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 He does not have a urinary bladder.  I think it is unlikely that his hematuria is due to infection.  Unfortunately it may be a sign of recurrent kidney cancer. His cancer care has been at [facility] (for over a decade it was Dr. Lida, but he is now seeing Dr. Raynaldo in the oncology clinic there), but he expressed a desire to have as much as possible of his care in [place] since it is finds it difficult to get to [facility] these days.  He expresses a  desire to be referred to urology and oncology here in [place].   Jerel Balding, MD, [facility] [phone] 02/09/2024, 6:02 PM     [1]  Allergies Allergen Reactions   Amoxicillin Other (See Comments)    Acute interstitial nephritis   Doxycycline Hives   Flomax [Tamsulosin Hcl] Other (See Comments)    Dizziness   "

## 2024-02-09 NOTE — Telephone Encounter (Signed)
 Called the patient- he is currently admitted and aware of results. Dr Francyne has also spoken with the patient.

## 2024-02-09 NOTE — H&P (Addendum)
 " History and Physical   Jordan Oconnell FMW:[id number] DOB: 06/03/1940 DOA: 02/09/2024  PCP: Shepard Ade, MD   Patient coming from: Home/direct admission  Chief Complaint: Anemia, creatinine  HPI: Jordan Oconnell is a 84 y.o. male with medical history significant of hypertension, hyperlipidemia, GERD, hypothyroidism, CKD 4, solitary kidney, atrial fibrillation, tachybradycardia syndrome status post pacemaker, aortic stenosis status post TAVR, chronic diastolic CHF, depression, anemia, bladder cancer status post urostomy presenting with anemia and AKI versus progressive CKD as direct admission.  Patient sent as a direct admission by cardiology after labs were obtained to further evaluate abnormal values on pacemaker transmission.  Patient reports feeling increasing fatigue for the past 1-2 weeks and reporting blood in nephrostomy bag.  He contacted his cardiology team to make sure his pacemaker was looking okay and they recommend he send a transmission.  Values came back as abnormal and patient was sent for labs and echocardiogram yesterday.  Lab workup yesterday came back with creatinine elevated to 3.79 which is increased from 3.3 three months ago and 2.5 nine months ago.  Hemoglobin down to 6.9 which is decreased from a baseline of 9 nine months ago.  TSH was mildly up at 20 with T4 of 1.16, outpatient team increased Synthroid  from 75 to 100 mcg.  Echocardiogram showed  G1 DD.  Cardiology team reached out for direct admission for transfusion and workup of his AKI versus progressive CKD.  Initial vital signs here are notable for blood pressure in the 120s systolic but otherwise stable.  Cardiology team is seeing in consultation.  Review of Systems: As per HPI otherwise all other systems reviewed and are negative.  Past Medical History:  Diagnosis Date   Acute on chronic systolic CHF (congestive heart failure) (HCC) 03/13/2022   Acute renal failure superimposed on stage 4 chronic  kidney disease (HCC) 03/13/2022   Atrial fibrillation with rapid ventricular response (HCC) 03/11/2022   Bladder cancer (HCC)    BPH (benign prostatic hyperplasia)    CAD (coronary artery disease)    CKD (chronic kidney disease), stage IV (HCC)    Colon polyps    Diverticulosis    GERD (gastroesophageal reflux disease)    Hyperchloremic metabolic acidosis    Hyperlipidemia    pt denies   Hypertension    LBBB (left bundle branch block)    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 Wonda and Dr. Lucas   Severe aortic stenosis 10/05/2020   Tachy-brady syndrome (HCC)    Toe pain, left 04/15/2023    Past Surgical History:  Procedure Laterality Date   AMPUTATION TOE Right 03/02/2023   Procedure: AMPUTATION TOE, 4th;  Surgeon: Barton Drape, MD;  Location: [facility]S;  Service: Orthopedics;  Laterality: Right;   AMPUTATION TOE Left 05/04/2023   Procedure: AMPUTATION, TOE;  Surgeon: Barton Drape, MD;  Location: [facility];  Service: Orthopedics;  Laterality: Left;  left foot 4th toe metatarsalphalangeal joint disarticulation   APPENDECTOMY     BLADDER SURGERY  01/25/2006   transurethral resection/resection of prostatic urethra   BOWEL RESECTION     CARDIOVERSION N/A 03/03/2022   Procedure: CARDIOVERSION;  Surgeon: Lonni Slain, MD;  Location: [facility] ENDOSCOPY;  Service: Cardiovascular;  Laterality: N/A;  CARDIOVERSION N/A 03/12/2022   Procedure: CARDIOVERSION;  Surgeon: Sheena Pugh, DO;  Location: [facility] ENDOSCOPY;  Service: Cardiovascular;  Laterality: N/A;   ESOPHAGOGASTRODUODENOSCOPY N/A 07/24/2013   Procedure: ESOPHAGOGASTRODUODENOSCOPY (EGD);  Surgeon: Norleen LOISE Kiang, MD;  Location: [facility];  Service: Endoscopy;   Laterality: N/A;   INTRAOPERATIVE TRANSTHORACIC ECHOCARDIOGRAM N/A 01/27/2021   Procedure: INTRAOPERATIVE TRANSTHORACIC ECHOCARDIOGRAM;  Surgeon: Wonda Sharper, MD;  Location: [facility] INVASIVE CV LAB;  Service: Open Heart Surgery;  Laterality: N/A;   IR FLUORO GUIDE CV LINE RIGHT  03/04/2023   IR REMOVAL TUN CV CATH W/O FL  05/09/2023   IR US  GUIDE VASC ACCESS RIGHT  03/04/2023   laparoscopic surgery  01/25/2010   laser   (?)   NEPHRECTOMY  01/25/2005   partial, left   NEPHROSTOMY  01/25/2009   stent   NM MYOCAR PERF WALL MOTION  09/08/2010   Normal   PACEMAKER IMPLANT N/A 02/12/2021   Procedure: PACEMAKER IMPLANT;  Surgeon: Cindie Ole DASEN, MD;  Location: [facility] INVASIVE CV LAB;  Service: Cardiovascular;  Laterality: N/A;   RIGHT HEART CATH AND CORONARY ANGIOGRAPHY N/A 10/06/2020   Procedure: RIGHT HEART CATH AND CORONARY ANGIOGRAPHY;  Surgeon: Wonda Sharper, MD;  Location: [facility] INVASIVE CV LAB;  Service: Cardiovascular;  Laterality: N/A;   ROBOT ASSISTED LAPAROSCOPIC COMPLETE CYSTECT ILEAL CONDUIT     TRANSCATHETER AORTIC VALVE REPLACEMENT, TRANSFEMORAL Right 01/27/2021   Procedure: TRANSCATHETER AORTIC VALVE REPLACEMENT, TRANSFEMORAL;  Surgeon: Wonda Sharper, MD;  Location: [facility] INVASIVE CV LAB;  Service: Open Heart Surgery;  Laterality: Right;   US  ECHOCARDIOGRAPHY  09/08/2010   mild diastolic dysfunction,mild dilated LA,mild MR,AI,mildly dilated aortic root    Social History  reports that he has never smoked. He has never used smokeless tobacco. He reports that he does not currently use alcohol . He reports that he does not use drugs.  Allergies[1]  Family History  Problem Relation Age of Onset   Ovarian cancer Mother    Colon cancer Mother    Colon cancer Father    Crohn's disease Son    Breast cancer Daughter    Colon cancer Brother   Reviewed on admission  Prior to Admission medications  Medication Sig Start Date End Date Taking? Authorizing Provider  acetaminophen   (TYLENOL ) 500 MG tablet Take 1,000 mg by mouth See admin instructions. Take 1,000 mg by mouth in the morning and evening    [provider]  allopurinol  (ZYLOPRIM ) 100 MG tablet Take 1 tablet (100 mg total) by mouth daily. 03/12/23   Gherghe, Costin M, MD  ALPRAZolam  (XANAX ) 0.25 MG tablet Take 1 tablet (0.25 mg total) by mouth at bedtime. 05/12/23   Swayze, Ava, DO  amiodarone  (PACERONE ) 200 MG tablet Take 1 tablet by mouth once daily 01/27/24   Croitoru, Mihai, MD  amLODipine  (NORVASC ) 10 MG tablet Take 1 tablet (10 mg total) by mouth daily. 03/12/23   Gherghe, Costin M, MD  aspirin  EC 81 MG tablet Take 1 tablet (81 mg total) by mouth daily. Swallow whole. 03/12/23   Gherghe, Costin M, MD  atorvastatin  (LIPITOR) 10 MG tablet Take 1 tablet (10 mg total) by mouth daily. Patient taking differently: Take 10 mg by mouth at bedtime. 03/12/23   Gherghe, Costin M, MD  bisacodyl  (DULCOLAX) 5 MG EC tablet Take 1 tablet (5 mg total) by mouth daily. Hold for 24 hours if more than 2 loose bowel movements in one day. 05/11/23   Swayze, Ava, DO  carvedilol  (COREG ) 12.5 MG tablet Take 12.5 mg  by mouth 2 (two) times daily.    [provider]  cefadroxil  (DURICEF) 500 MG capsule Take 1 capsule (500 mg total) by mouth 2 (two) times daily. 05/11/23   Swayze, Ava, DO  cyanocobalamin  1000 MCG tablet Take 1 tablet (1,000 mcg total) by mouth daily. 03/12/23   Gherghe, Costin M, MD  docusate sodium  (COLACE) 100 MG capsule Take 1 capsule (100 mg total) by mouth daily. 05/11/23   Swayze, Ava, DO  ELIQUIS  2.5 MG TABS tablet Take 1 tablet by mouth twice daily 01/05/24   Croitoru, Mihai, MD  feeding supplement (ENSURE ENLIVE / ENSURE PLUS) LIQD Take 237 mLs by mouth 2 (two) times daily between meals. 05/11/23   Swayze, Ava, DO  gentamicin  cream (GARAMYCIN ) 0.1 % Apply 1 Application topically at bedtime. 09/15/23   [provider]  hydrALAZINE  (APRESOLINE ) 25 MG tablet Take 1 tablet (25 mg total) by mouth 2 (two)  times daily. 03/12/23   Gherghe, Costin M, MD  isosorbide  mononitrate (IMDUR ) 30 MG 24 hr tablet Take 1 tablet (30 mg total) by mouth daily. 03/12/23   Gherghe, Costin M, MD  levothyroxine  (SYNTHROID ) 75 MCG tablet Take 75 mcg by mouth daily before breakfast.    [provider]  linezolid  (ZYVOX ) 600 MG tablet Take 1 tablet (600 mg total) by mouth every 12 (twelve) hours. 05/11/23   Swayze, Ava, DO  mirtazapine  (REMERON ) 15 MG tablet Take 7.5 mg by mouth at bedtime. 08/09/20   [provider]  nitroGLYCERIN  (NITROSTAT ) 0.4 MG SL tablet 1 tablet under the tongue and allow to dissolve as needed. Take every 5 minutes up to 3 times if chest pain persists 02/03/24     nutrition supplement, JUVEN, (JUVEN) PACK Take 1 packet by mouth 2 (two) times daily with a meal. 05/11/23   Swayze, Ava, DO  oxyCODONE  (OXY IR/ROXICODONE ) 5 MG immediate release tablet Take 1 tablet (5 mg total) by mouth every 6 (six) hours as needed for moderate pain (pain score 4-6). 05/12/23   Swayze, Ava, DO  sodium bicarbonate  650 MG tablet Take 1,300 mg by mouth See admin instructions. Take 1,300 mg by mouth in the morning and evening 09/01/20   [provider]    Physical Exam: Vitals:   02/09/24 1612  BP: (!) 129/59  Temp: (!) 97.5 F (36.4 C)  TempSrc: Oral  SpO2: 95%    Physical Exam Constitutional:      General: He is not in acute distress.    Appearance: Normal appearance.  HENT:     Head: Normocephalic and atraumatic.     Mouth/Throat:     Mouth: Mucous membranes are moist.     Pharynx: Oropharynx is clear.  Eyes:     Extraocular Movements: Extraocular movements intact.     Pupils: Pupils are equal, round, and reactive to light.  Cardiovascular:     Rate and Rhythm: Normal rate and regular rhythm.     Pulses: Normal pulses.     Heart sounds: Normal heart sounds.  Pulmonary:     Effort: Pulmonary effort is normal. No respiratory distress.     Breath sounds: Normal breath sounds.   Abdominal:     General: Bowel sounds are normal. There is no distension.     Palpations: Abdomen is soft.     Tenderness: There is no abdominal tenderness.  Genitourinary:    Comments: Frankly bloody urine in nephrostomy bad Musculoskeletal:        General: No swelling or deformity.  Skin:  General: Skin is warm and dry.  Neurological:     General: No focal deficit present.     Mental Status: Mental status is at baseline.    Labs on Admission: I have personally reviewed following labs and imaging studies  CBC: Recent Labs  Lab 02/08/24 1201  WBC 4.4  HGB 6.9*  HCT 22.8*  MCV 108*  PLT 132*    Basic Metabolic Panel: Recent Labs  Lab 02/08/24 1201  NA 142  K 4.8  CL 107*  CO2 15*  GLUCOSE 136*  BUN 59*  CREATININE 3.79*  CALCIUM  8.9    GFR: CrCl cannot be calculated (Unknown ideal weight.).  Liver Function Tests: No results for input(s): AST, ALT, ALKPHOS, BILITOT, PROT, ALBUMIN  in the last 168 hours.  Urine analysis:    Component Value Date/Time   COLORURINE YELLOW 01/23/2021 1000   APPEARANCEUR CLOUDY (A) 01/23/2021 1000   LABSPEC 1.010 01/23/2021 1000   PHURINE 8.0 01/23/2021 1000   GLUCOSEU NEGATIVE 01/23/2021 1000   HGBUR SMALL (A) 01/23/2021 1000   BILIRUBINUR NEGATIVE 01/23/2021 1000   KETONESUR NEGATIVE 01/23/2021 1000   PROTEINUR 100 (A) 01/23/2021 1000   UROBILINOGEN 0.2 07/17/2013 1146   NITRITE POSITIVE (A) 01/23/2021 1000   LEUKOCYTESUR LARGE (A) 01/23/2021 1000    Radiological Exams on Admission: ECHOCARDIOGRAM COMPLETE Result Date: 02/08/2024    ECHOCARDIOGRAM REPORT   Patient Name:   Socorro PARIS CHIRIBOGA Date of Exam: 02/08/2024 Medical Rec #:  [id number]         Height:       73.0 in Accession #:    [id number]        Weight:       190.6 lb Date of Birth:  [birth year]         BSA:          2.108 m Patient Age:    83 years          BP:           95/51 mmHg Patient Gender: M                 HR:           65 bpm. Exam Location:   [address] Procedure: 2D Echo, Cardiac Doppler and Color Doppler (Both Spectral and Color            Flow Doppler were utilized during procedure). Indications:    I35.1 Nonrheumatic aortic (valve) insufficiency  History:        Patient has prior history of Echocardiogram examinations, most                 recent 03/06/2023. Risk Factors:Hypertension.                 Aortic Valve: 29 mm Edwards Sapien prosthetic, stented (TAVR)                 valve is present in the aortic position. Procedure Date:                 01/27/2021.  Sonographer:    Augustin Seals RDCS Referring Phys: [phone] MIHAI CROITORU IMPRESSIONS  1. Left ventricular ejection fraction, by estimation, is 50 to 55%. The left ventricle has low normal function. The left ventricle has no regional wall motion abnormalities. There is moderate left ventricular hypertrophy. Left ventricular diastolic parameters are consistent with Grade I diastolic dysfunction (impaired relaxation).  2. Right ventricular systolic function is normal. The right ventricular size is  mildly enlarged.  3. Left atrial size was moderately dilated.  4. The mitral valve is normal in structure. Mild mitral valve regurgitation. No evidence of mitral stenosis.  5. The aortic valve has been repaired/replaced. Aortic valve regurgitation is not visualized. No aortic stenosis is present. There is a 29 mm Edwards Sapien prosthetic, stented Sapien prosthetic (TAVR) valve present in the aortic position. Procedure Date: 01/27/2021. Echo findings are consistent with normal structure and function of the aortic valve prosthesis. Aortic valve mean gradient measures 13.0 mmHg. Aortic valve Vmax measures 2.53 m/s.  6. The inferior vena cava is normal in size with greater than 50% respiratory variability, suggesting right atrial pressure of 3 mmHg. FINDINGS  Left Ventricle: Left ventricular ejection fraction, by estimation, is 50 to 55%. The left ventricle has low normal function. The left ventricle has no  regional wall motion abnormalities. The left ventricular internal cavity size was normal in size. There is moderate left ventricular hypertrophy. Left ventricular diastolic parameters are consistent with Grade I diastolic dysfunction (impaired relaxation). Right Ventricle: The right ventricular size is mildly enlarged. No increase in right ventricular wall thickness. Right ventricular systolic function is normal. Left Atrium: Left atrial size was moderately dilated. Right Atrium: Right atrial size was normal in size. Pericardium: There is no evidence of pericardial effusion. Mitral Valve: The mitral valve is normal in structure. Mild mitral valve regurgitation. No evidence of mitral valve stenosis. Tricuspid Valve: The tricuspid valve is normal in structure. Tricuspid valve regurgitation is trivial. No evidence of tricuspid stenosis. Aortic Valve: The aortic valve has been repaired/replaced. Aortic valve regurgitation is not visualized. No aortic stenosis is present. Aortic valve mean gradient measures 13.0 mmHg. Aortic valve peak gradient measures 25.6 mmHg. Aortic valve area, by VTI measures 2.04 cm. There is a 29 mm Edwards Sapien prosthetic, stented Sapien prosthetic, stented (TAVR) valve present in the aortic position. Procedure Date: 01/27/2021. Echo findings are consistent with normal structure and function of the aortic valve prosthesis. Pulmonic Valve: The pulmonic valve was normal in structure. Pulmonic valve regurgitation is not visualized. No evidence of pulmonic stenosis. Aorta: The aortic root is normal in size and structure. Venous: The inferior vena cava is normal in size with greater than 50% respiratory variability, suggesting right atrial pressure of 3 mmHg. IAS/Shunts: No atrial level shunt detected by color flow Doppler. Additional Comments: A device lead is visualized in the right ventricle.  LEFT VENTRICLE PLAX 2D LVIDd:         4.95 cm      Diastology LVIDs:         3.35 cm      LV e' medial:    5.92 cm/s LV PW:         1.30 cm      LV E/e' medial: 13.9 LV IVS:        1.45 cm LVOT diam:     2.40 cm LV SV:         108 LV SV Index:   51 LVOT Area:     4.52 cm  LV Volumes (MOD) LV vol d, MOD A2C: 128.0 ml LV vol d, MOD A4C: 99.6 ml LV vol s, MOD A2C: 56.9 ml LV vol s, MOD A4C: 50.8 ml LV SV MOD A2C:     71.1 ml LV SV MOD A4C:     99.6 ml LV SV MOD BP:      61.8 ml RIGHT VENTRICLE RV Basal diam:  4.50 cm     PULMONARY  VEINS RV Mid diam:    3.60 cm     A Reversal Velocity: 32.30 cm/s RV S prime:     13.40 cm/s  Diastolic Velocity:  48.80 cm/s TAPSE (M-mode): 2.7 cm      S/D Velocity:        1.20                             Systolic Velocity:   60.30 cm/s LEFT ATRIUM             Index        RIGHT ATRIUM           Index LA diam:        4.60 cm 2.18 cm/m   RA Area:     17.60 cm LA Vol (A2C):   70.4 ml 33.40 ml/m  RA Volume:   45.90 ml  21.77 ml/m LA Vol (A4C):   86.4 ml 40.99 ml/m LA Biplane Vol: 81.2 ml 38.52 ml/m  AORTIC VALVE AV Area (Vmax):    2.02 cm AV Area (Vmean):   2.14 cm AV Area (VTI):     2.04 cm AV Vmax:           253.00 cm/s AV Vmean:          165.000 cm/s AV VTI:            0.527 m AV Peak Grad:      25.6 mmHg AV Mean Grad:      13.0 mmHg LVOT Vmax:         113.00 cm/s LVOT Vmean:        78.100 cm/s LVOT VTI:          0.238 m LVOT/AV VTI ratio: 0.45  AORTA Ao Root diam: 4.30 cm Ao Asc diam:  4.30 cm MITRAL VALVE MV Area (PHT): 3.48 cm     SHUNTS MV Decel Time: 218 msec     Systemic VTI:  0.24 m MV E velocity: 82.30 cm/s   Systemic Diam: 2.40 cm MV A velocity: 112.50 cm/s MV E/A ratio:  0.73 Oneil Parchment MD Electronically signed by Oneil Parchment MD Signature Date/Time: 02/08/2024/2:10:24 PM    Final    EKG: Not yet performed  Assessment/Plan Principal Problem:   Acute blood loss anemia Active Problems:   Essential hypertension   Bladder cancer (HCC)   PAF (paroxysmal atrial fibrillation) (HCC)   CKD (chronic kidney disease) stage 4, GFR 15-29 ml/min (HCC)   Severe aortic  stenosis   S/P TAVR (transcatheter aortic valve replacement)   Pacemaker   Tachycardia-bradycardia syndrome (HCC)   Chronic combined systolic and diastolic CHF (congestive heart failure) (HCC)   Hypothyroidism   Gastro-esophageal reflux disease without esophagitis   Hyperlipidemia   Major depression, single episode   Presence of urostomy (HCC)   Acute on chronic anemia > Patient reports recentfatigue.  Has had issues with hematuria in the setting of prior bladder cancer status post urostomy, frank blood in urostomy. > Lab workup yesterday outpatient showed hemoglobin down to 6.9 which is decreased from baseline of 9 nine months ago.  Interestingly labs indicated macrocytosis. > Sent as a direct admission for this and elevated creatinine.  Has already been advised to hold Eliquis . - Monitor on telemetry - Repeat CBC - Type and screen - Transfuse 1 unit PRBC for now - Continue to trend hemoglobin/CBC - Check iron, ferritin, reticulocytes, folate, B12.  Elevated creatinine AKI on  CKD 4 versus progressive CKD Solitary kidney > Creatinine checked yesterday was elevated to 3.79 which is increased from 3.3 three months ago and 2.5 nine months ago. > Does have CHF, but not volume overloaded on exam. - Gentle IV fluids  - Trend renal function and electrolytes  Hypertension - Continue home amlodipine , Coreg , Imdur  - Holding hydralazine  and amlodipine  with active bleeding  Tachybradycardia syndrome - Status post pacemaker  Atrial fibrillation - Continue home amiodarone  - Holding home Eliquis  as above  Chronic diastolic CHF > Echo yesterday showed EF 50-55%, G1 DD, normal RV function. - Not currently on loop diuretic - Continue Imdur , Coreg   Depression - Continue home Remeron  and Xanax   History of bladder cancer > Follows with [facility] oncology.  Status post urostomy and immunotherapy. 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  > Continued remission on scans and completion of ~12 mo of pembro and RT > Plan was to continuing surveillance, he was to follow up this month but he put it off to allow time for a trip. > Given the frank blood in his urostomy site we will go ahead and get repeat scans of his abdomen and pelvis that he would be due for for follow-up of his bladder cancer as well. - CT abdomen and pelvis WO  DVT prophylaxis: SCDs Code Status:   Full Family Communication:  None on admission  Disposition Plan:   Patient is from:  Home  Anticipated DC to:  Home  Anticipated DC date:  1 to 3 days  Anticipated DC barriers: None  Consults called:  Cardiology following on arrival Admission status:  Observation, telemetry  Severity of Illness: The appropriate patient status for this patient is OBSERVATION. Observation status is judged to be reasonable and necessary in order to provide the required intensity of service to ensure the patient's safety. The patient's presenting symptoms, physical exam findings, and initial radiographic and laboratory data in the context of their medical condition is felt to place them at decreased risk for further clinical deterioration. Furthermore, it is anticipated that the patient will be medically stable for discharge from the hospital within 2 midnights of admission.    Marsa KATHEE Scurry MD [facility]  How to contact the [facility] Attending or Consulting provider 7A - 7P or covering provider during after hours 7P -7A, for this patient?   Check the care team in [facility] and look for a) attending/consulting [facility] provider listed and b) the [facility] team listed Log into [email or web address] and use [place]'s universal password to access. If you do not have the password, please contact the hospital operator. Locate the [facility] provider you are looking for under [facility] and page to a number that you can be directly reached. If you still have  difficulty reaching the provider, please page the [facility] (Director on Call) for the Hospitalists listed on amion for assistance.  02/09/2024, 4:44 PM       [1]  Allergies Allergen Reactions   Amoxicillin Other (See Comments)    Acute interstitial nephritis   Doxycycline Hives   Flomax [Tamsulosin Hcl] Other (See Comments)    Dizziness   "

## 2024-02-09 NOTE — Telephone Encounter (Signed)
 Patient is requesting a call back, has concerns and questions about the conversation.

## 2024-02-10 ENCOUNTER — Other Ambulatory Visit: Payer: Self-pay

## 2024-02-10 ENCOUNTER — Encounter (HOSPITAL_COMMUNITY): Payer: Self-pay | Admitting: Internal Medicine

## 2024-02-10 DIAGNOSIS — N184 Chronic kidney disease, stage 4 (severe): Secondary | ICD-10-CM | POA: Diagnosis present

## 2024-02-10 DIAGNOSIS — N179 Acute kidney failure, unspecified: Secondary | ICD-10-CM | POA: Diagnosis present

## 2024-02-10 DIAGNOSIS — T8386XA Thrombosis of genitourinary prosthetic devices, implants and grafts, initial encounter: Secondary | ICD-10-CM | POA: Diagnosis present

## 2024-02-10 DIAGNOSIS — C774 Secondary and unspecified malignant neoplasm of inguinal and lower limb lymph nodes: Secondary | ICD-10-CM | POA: Diagnosis present

## 2024-02-10 DIAGNOSIS — C661 Malignant neoplasm of right ureter: Secondary | ICD-10-CM | POA: Diagnosis present

## 2024-02-10 DIAGNOSIS — Z7901 Long term (current) use of anticoagulants: Secondary | ICD-10-CM | POA: Diagnosis not present

## 2024-02-10 DIAGNOSIS — K219 Gastro-esophageal reflux disease without esophagitis: Secondary | ICD-10-CM | POA: Diagnosis present

## 2024-02-10 DIAGNOSIS — N400 Enlarged prostate without lower urinary tract symptoms: Secondary | ICD-10-CM | POA: Diagnosis present

## 2024-02-10 DIAGNOSIS — I5042 Chronic combined systolic (congestive) and diastolic (congestive) heart failure: Secondary | ICD-10-CM | POA: Diagnosis present

## 2024-02-10 DIAGNOSIS — I480 Paroxysmal atrial fibrillation: Secondary | ICD-10-CM | POA: Diagnosis present

## 2024-02-10 DIAGNOSIS — I472 Ventricular tachycardia, unspecified: Secondary | ICD-10-CM | POA: Diagnosis not present

## 2024-02-10 DIAGNOSIS — R310 Gross hematuria: Secondary | ICD-10-CM

## 2024-02-10 DIAGNOSIS — I495 Sick sinus syndrome: Secondary | ICD-10-CM | POA: Diagnosis present

## 2024-02-10 DIAGNOSIS — I442 Atrioventricular block, complete: Secondary | ICD-10-CM | POA: Diagnosis present

## 2024-02-10 DIAGNOSIS — I2510 Atherosclerotic heart disease of native coronary artery without angina pectoris: Secondary | ICD-10-CM | POA: Diagnosis present

## 2024-02-10 DIAGNOSIS — C671 Malignant neoplasm of dome of bladder: Secondary | ICD-10-CM | POA: Diagnosis not present

## 2024-02-10 DIAGNOSIS — E032 Hypothyroidism due to medicaments and other exogenous substances: Secondary | ICD-10-CM | POA: Diagnosis present

## 2024-02-10 DIAGNOSIS — D500 Iron deficiency anemia secondary to blood loss (chronic): Secondary | ICD-10-CM | POA: Diagnosis present

## 2024-02-10 DIAGNOSIS — Y831 Surgical operation with implant of artificial internal device as the cause of abnormal reaction of the patient, or of later complication, without mention of misadventure at the time of the procedure: Secondary | ICD-10-CM | POA: Diagnosis present

## 2024-02-10 DIAGNOSIS — D62 Acute posthemorrhagic anemia: Secondary | ICD-10-CM | POA: Diagnosis present

## 2024-02-10 DIAGNOSIS — E785 Hyperlipidemia, unspecified: Secondary | ICD-10-CM | POA: Diagnosis present

## 2024-02-10 DIAGNOSIS — I130 Hypertensive heart and chronic kidney disease with heart failure and stage 1 through stage 4 chronic kidney disease, or unspecified chronic kidney disease: Secondary | ICD-10-CM | POA: Diagnosis present

## 2024-02-10 DIAGNOSIS — Z952 Presence of prosthetic heart valve: Secondary | ICD-10-CM | POA: Diagnosis not present

## 2024-02-10 DIAGNOSIS — I340 Nonrheumatic mitral (valve) insufficiency: Secondary | ICD-10-CM | POA: Diagnosis present

## 2024-02-10 DIAGNOSIS — F329 Major depressive disorder, single episode, unspecified: Secondary | ICD-10-CM | POA: Diagnosis present

## 2024-02-10 DIAGNOSIS — Z936 Other artificial openings of urinary tract status: Secondary | ICD-10-CM | POA: Diagnosis not present

## 2024-02-10 DIAGNOSIS — C787 Secondary malignant neoplasm of liver and intrahepatic bile duct: Secondary | ICD-10-CM | POA: Diagnosis present

## 2024-02-10 DIAGNOSIS — I4892 Unspecified atrial flutter: Secondary | ICD-10-CM | POA: Diagnosis present

## 2024-02-10 LAB — BASIC METABOLIC PANEL WITH GFR
BUN/Creatinine Ratio: 16 (ref 10–24)
BUN: 59 mg/dL — ABNORMAL HIGH (ref 8–27)
CO2: 15 mmol/L — ABNORMAL LOW (ref 20–29)
Calcium: 8.9 mg/dL (ref 8.6–10.2)
Chloride: 107 mmol/L — ABNORMAL HIGH (ref 96–106)
Creatinine, Ser: 3.79 mg/dL — ABNORMAL HIGH (ref 0.76–1.27)
Glucose: 136 mg/dL — ABNORMAL HIGH (ref 70–99)
Potassium: 4.8 mmol/L (ref 3.5–5.2)
Sodium: 142 mmol/L (ref 134–144)
eGFR: 15 mL/min/1.73 — ABNORMAL LOW

## 2024-02-10 LAB — CBC
HCT: 20.1 % — ABNORMAL LOW (ref 39.0–52.0)
Hematocrit: 22.8 % — ABNORMAL LOW (ref 37.5–51.0)
Hemoglobin: 6.9 g/dL — CL (ref 13.0–17.7)
Hemoglobin: 6.2 g/dL — CL (ref 13.0–17.0)
MCH: 32.7 pg (ref 26.6–33.0)
MCH: 32.3 pg (ref 26.0–34.0)
MCHC: 30.3 g/dL — ABNORMAL LOW (ref 31.5–35.7)
MCHC: 30.8 g/dL (ref 30.0–36.0)
MCV: 108 fL — ABNORMAL HIGH (ref 79–97)
MCV: 104.7 fL — ABNORMAL HIGH (ref 80.0–100.0)
Platelets: 132 x10E3/uL — ABNORMAL LOW (ref 150–450)
Platelets: 89 K/uL — ABNORMAL LOW (ref 150–400)
RBC: 2.11 x10E6/uL — CL (ref 4.14–5.80)
RBC: 1.92 MIL/uL — ABNORMAL LOW (ref 4.22–5.81)
RDW: 13.6 % (ref 11.6–15.4)
RDW: 19.9 % — ABNORMAL HIGH (ref 11.5–15.5)
WBC: 4.4 x10E3/uL (ref 3.4–10.8)
WBC: 3.7 K/uL — ABNORMAL LOW (ref 4.0–10.5)
nRBC: 0 % (ref 0.0–0.2)

## 2024-02-10 LAB — HEMOGLOBIN AND HEMATOCRIT, BLOOD
HCT: 24.3 % — ABNORMAL LOW (ref 39.0–52.0)
HCT: 23.4 % — ABNORMAL LOW (ref 39.0–52.0)
HCT: 27.9 % — ABNORMAL LOW (ref 39.0–52.0)
Hemoglobin: 7.7 g/dL — ABNORMAL LOW (ref 13.0–17.0)
Hemoglobin: 7.2 g/dL — ABNORMAL LOW (ref 13.0–17.0)
Hemoglobin: 8.8 g/dL — ABNORMAL LOW (ref 13.0–17.0)

## 2024-02-10 LAB — COMPREHENSIVE METABOLIC PANEL WITH GFR
ALT: 9 U/L (ref 0–44)
AST: 16 U/L (ref 15–41)
Albumin: 3.1 g/dL — ABNORMAL LOW (ref 3.5–5.0)
Alkaline Phosphatase: 38 U/L (ref 38–126)
Anion gap: 11 (ref 5–15)
BUN: 63 mg/dL — ABNORMAL HIGH (ref 8–23)
CO2: 19 mmol/L — ABNORMAL LOW (ref 22–32)
Calcium: 8.1 mg/dL — ABNORMAL LOW (ref 8.9–10.3)
Chloride: 107 mmol/L (ref 98–111)
Creatinine, Ser: 4.1 mg/dL — ABNORMAL HIGH (ref 0.61–1.24)
GFR, Estimated: 14 mL/min — ABNORMAL LOW
Glucose, Bld: 87 mg/dL (ref 70–99)
Potassium: 4.5 mmol/L (ref 3.5–5.1)
Sodium: 137 mmol/L (ref 135–145)
Total Bilirubin: 0.5 mg/dL (ref 0.0–1.2)
Total Protein: 4.9 g/dL — ABNORMAL LOW (ref 6.5–8.1)

## 2024-02-10 LAB — PREPARE RBC (CROSSMATCH)

## 2024-02-10 LAB — BRAIN NATRIURETIC PEPTIDE: BNP: 169.5 pg/mL — AB (ref 0.0–100.0)

## 2024-02-10 LAB — T4, FREE: Free T4: 1.16 ng/dL (ref 0.82–1.77)

## 2024-02-10 LAB — TSH: TSH: 20.3 u[IU]/mL — ABNORMAL HIGH (ref 0.450–4.500)

## 2024-02-10 MED ORDER — SODIUM CHLORIDE 0.9% IV SOLUTION: Freq: Once | INTRAVENOUS | Status: AC

## 2024-02-10 MED ORDER — SODIUM CHLORIDE 0.9 % IV SOLN: INTRAVENOUS | Status: AC

## 2024-02-10 MED ORDER — FUROSEMIDE 10 MG/ML IJ SOLN
20.0000 mg | Freq: Once | INTRAMUSCULAR | Status: AC
  Administered 2024-02-10: 20 mg via INTRAVENOUS
  Filled 2024-02-10: qty 2

## 2024-02-10 MED ORDER — LEVOTHYROXINE SODIUM 100 MCG PO TABS
200.0000 ug | ORAL_TABLET | Freq: Every day | ORAL | Status: DC
  Administered 2024-02-10 – 2024-02-23 (×14): 200 ug via ORAL
  Filled 2024-02-10 (×14): qty 2

## 2024-02-10 NOTE — Progress Notes (Addendum)
 "  Progress Note  Patient Name: Jordan Oconnell Date of Encounter: 02/10/2024 [facility] Cardiologist: Jerel Balding, MD   Interval Summary   Patient states that he is feeling better today after receiving transfusions. He notes that his does not feel as weak as he did yesterday. Continues to have hematuria. Denies lightheadedness/dizziness, chest pain, shortness of breath, orthopnea, PND, palpitations, lower extremity swelling. No other complaints at this time  Vital Signs Vitals:   02/10/24 0319 02/10/24 0544 02/10/24 0545 02/10/24 0730  BP: (!) 122/55  126/60 132/60  Pulse: 68  75 67  Resp: 18  16 16   Temp: 97.8 F (36.6 C)  97.8 F (36.6 C) 98.2 F (36.8 C)  TempSrc: Oral  Oral Oral  SpO2: 95% 92% 92% 93%  Weight:    84.1 kg  Height:    6' 1 (1.854 m)    Intake/Output Summary (Last 24 hours) at 02/10/2024 0852 Last data filed at 02/10/2024 0729 Gross per 24 hour  Intake 1221.12 ml  Output 850 ml  Net 371.12 ml      02/10/2024    7:30 AM 11/23/2023    3:30 PM 11/08/2023    8:38 AM  Last 3 Weights  Weight (lbs) 185 lb 8 oz 190 lb 9.6 oz 189 lb 9.6 oz  Weight (kg) 84.142 kg 86.456 kg 86.002 kg      Telemetry/ECG  Paced rhythm - Personally Reviewed  Physical Exam  GEN: Resting comfortably in bed, in no acute distress.   Neck: No JVD Cardiac: RRR, no murmurs, rubs, or gallops.  Respiratory: Clear to auscultation bilaterally. GI: Soft, nontender, non-distended  MS: No peripheral edema  Assessment & Plan   Symptomatic anemia Suspected due to hematuria in the setting of complex urologic history and concomitant Eliquis /ASA Hgb as low as 5.5  >> 7.7 this morning s/p 2 units PRBC Hematuria ongoing, frank blood urine in nephrostomy bag  Continue to trend hemoglobin/CBC Ongoing management per primary  HTN with hypotension on arrival Presented for outpatient echo on 1/14 with initial BP 85/52, pt advised to take only half home amlodipine  dose BP stable  this morning Hold hydralazine  and amlodipine  given anemia Otherwise continue carvedilol , Imdur  at present doses as long as BP allows  Paroxysmal atrial fibrillation/flutter with tachy-brady s/p PPM Normal pacemaker function on most recent interrogation 11/2023. Very low AF burden (none detected throughout 2025, <1% in 2024, last documented meaningful episode of AF was a 4-hours event in August 2024) Telemetry shows sinus (atrial sensed, ventricular paced) Continue on home amiodarone  200 mg daily Eliquis  on hold due to acute anemia and hematuria  Severe AS s/p TAVR Echo 1/14 consistent with normal structure and function of TAVR prosthesis  Chronic HFimpEF Echo 1/14 LVEF 50-55%, moderate LVH, G1DD, normal RV systolic function, mildly enlarged RV, moderately dilated LA, mild MR, normal TAVR prosthesis, normal IVC -- EF improved from 45-50% last echo, otherwise largely unchanged Outpatient BNP pending Euvolemic on exam  Moderate CAD by cath 2022 with NSTEMI No chest pain or other ischemic symptoms Hold ASA Continue home atorvastatin   Hypothyroidism TSH 20.3 but T4 within normal limits Suspect amiodarone -related hypothyroid, would continued levothyroxine  as ordered (increased from home dose)  AKI on CKD IV Suspect in the setting of anemia S/p total left nephrectomy and partial right nephrectomy for urothelial cancer Avoid nephrotoxic agents Ongoing management per primary  Per primary Hematuria History of urothelial cancer GERD   For questions or updates, please contact [facility] Please  consult [email or web address] for contact info under         Signed, Hanh K Le, PA-C    I have seen and examined the patient along with Hanh K Le, PA-C .  I have reviewed the chart, notes and new data.  I agree with PA/NP's note.  Key new complaints: feels stronger after transfusion Key examination changes: hemodynamically stable, RRR, faint Ao ejection murmur, no edema, clear  lungs Key new findings / data: ominous findings on abdominal CT worrisome for enlarging renal mass (although comparison was made to [facility] CT from 2024, not most recent [facility] CT from July 2025), retroperitoneal lymphadenopathy (not seen on the [facility] CT) and several liver abnormalities (appear more widespread and larger than simple cysts on [facility] CT), concerning for metastatic disease.  PLAN: Will need Urology and Oncology evaluation. Thankfully, his cardiac problems remain well-compensated.  Jerel Balding, MD, [facility] [phone] 02/10/2024, 1:21 PM  "

## 2024-02-10 NOTE — Progress Notes (Signed)
 "  [facility] PROGRESS NOTE   Jordan Oconnell FMW:[id number] DOB: 03-24-1940 DOA: 02/09/2024  PCP: Shepard Ade, MD  Brief History: 84 y.o. male with medical history significant of hypertension, hyperlipidemia, GERD, hypothyroidism, CKD 4, solitary kidney, atrial fibrillation, tachybradycardia syndrome status post pacemaker, aortic stenosis status post TAVR, chronic diastolic CHF, depression, anemia, bladder cancer status post cystectomy and urostomy, left total nephrectomy, right partial nephrectomy, presenting with anemia and AKI versus progressive CKD as direct admission. Patient sent as a direct admission by cardiology after labs were obtained to further evaluate abnormal values on pacemaker transmission.  He did mention fatigue ongoing for 1 to 2 weeks.  He was noted to be profoundly anemic.  He was hospitalized for further management.  Consultants: Cardiology.  Urology  Procedures: None yet    Subjective/Interval History: Patient mentions that he feels slightly better after blood transfusion.  Denies any chest pain shortness of breath nausea vomiting.  No abdominal pain.  Continues to have blood in his urostomy.    Assessment/Plan:  Acute blood loss anemia Anemia most likely due to gross hematuria.  This is in the setting of Eliquis . Patient was transfused 2 units of PRBC.  Hemoglobin up to 7.7 today.  Will recheck this afternoon.  May need more transfusions.  Gross hematuria/history of bladder cancer/urothelial cancer/RCC This is in the setting of a complicated urological history with history of bladder cancer, renal cell cancer.  He is status post cystectomy and has a urostomy.  He also is status post partial right nephrectomy and total left nephrectomy. Has had gross hematuria the last several days. Eliquis  has been held. Patient is [facility] for urologic issues but wants to transfer care locally if possible.  Have reached out to urology who will evaluate  the patient. CT scan of the abdomen pelvis raises concern for hepatic metastases.  Also showed new retroperitoneal and right inguinal lymphadenopathy also concerning for metastatic disease.  Found to have mass in the right pelvic collecting system but this was seen on previous CT scan as well. Will need to be seen by medical oncology eventually.  No indication to involve them currently.  Acute kidney injury on chronic kidney disease stage IV Patient with solitary kidney. Baseline creatinine seems to be between 2.5-3.0.  Higher creatinine noted.  Most likely due to volume loss.  Continue with IV fluids.  Monitor urine output.  Monitor labs daily.  Electrolytes are stable.  Essential hypertension Noted to be on carvedilol , nitrates.  Looks like his amlodipine  and hydralazine  is on hold.    Paroxysmal atrial fibrillation/atrial flutter with tachybrady/pacemaker in situ Followed by cardiology who are seeing him in the hospital. Noted to be on amiodarone . Eliquis  on hold due to anemia and gross hematuria.  History of severe aortic stenosis status post TAVR Noted to be stable.  Chronic diastolic CHF/coronary artery disease Volume status seems to be stable.  Perhaps a little hypovolemic.  Monitor ins and outs and daily weights. Recent echo showed normal LVEF.  Hypothyroidism Continue levothyroxine .  Cholelithiasis Incidentally noted on CT scan.  Seems to be asymptomatic.  Abdominal wall hernia Asymptomatic.  DVT Prophylaxis: SCDs Code Status: Full code Family Communication: Discussed with patient Disposition Plan: Lives by himself.  Status is: Observation The patient will require care spanning > 2 midnights and should be moved to inpatient because: Acute blood loss anemia, gross hematuria      Medications: Scheduled:  ALPRAZolam   0.25 mg Oral QHS   amiodarone   200  mg Oral Daily   atorvastatin   10 mg Oral QHS   carvedilol   12.5 mg Oral BID PC   feeding supplement  237 mL Oral  BID BM   isosorbide  mononitrate  30 mg Oral Daily   levothyroxine   200 mcg Oral QAC breakfast   mirtazapine   7.5 mg Oral QHS   sodium chloride  flush  3 mL Intravenous Q12H   Continuous:  sodium chloride  75 mL/hr at 02/10/24 0917   PRN:acetaminophen  **OR** acetaminophen , polyethylene glycol  Antibiotics: Anti-infectives (From admission, onward)    None       Objective:  Vital Signs  Vitals:   02/10/24 0319 02/10/24 0544 02/10/24 0545 02/10/24 0730  BP: (!) 122/55  126/60 132/60  Pulse: 68  75 67  Resp: 18  16 16   Temp: 97.8 F (36.6 C)  97.8 F (36.6 C) 98.2 F (36.8 C)  TempSrc: Oral  Oral Oral  SpO2: 95% 92% 92% 93%  Weight:    84.1 kg  Height:    6' 1 (1.854 m)    Intake/Output Summary (Last 24 hours) at 02/10/2024 1049 Last data filed at 02/10/2024 0931 Gross per 24 hour  Intake 1221.12 ml  Output 2025 ml  Net -803.88 ml   Filed Weights   02/10/24 0730  Weight: 84.1 kg    General appearance: Awake alert.  In no distress Resp: Clear to auscultation bilaterally.  Normal effort Cardio: S1-S2 is normal regular.  No S3-S4.  No rubs murmurs or bruit GI: Abdomen is soft.  Nontender nondistended.  Bowel sounds are present normal.  No masses organomegaly.  Urostomy noted with gross hematuria Extremities: No edema.  Full range of motion of lower extremities. Neurologic: Alert and oriented x3.  No focal neurological deficits.    Lab Results:  Data Reviewed: I have personally reviewed following labs and reports of the imaging studies  CBC: Recent Labs  Lab 02/08/24 1201 02/09/24 1833 02/10/24 0154 02/10/24 0705  WBC 4.4 3.8* 3.7*  --   HGB 6.9* 5.5* 6.2* 7.7*  HCT 22.8* 18.8* 20.1* 24.3*  MCV 108* 113.3* 104.7*  --   PLT 132* 95* 89*  --     Basic Metabolic Panel: Recent Labs  Lab 02/08/24 1201 02/09/24 1833 02/10/24 0154  NA 142 137 137  K 4.8 4.6 4.5  CL 107* 105 107  CO2 15* 19* 19*  GLUCOSE 136* 207* 87  BUN 59* 64* 63*  CREATININE  3.79* 4.28* 4.10*  CALCIUM  8.9 8.5* 8.1*  MG  --  2.3  --     GFR: Estimated Creatinine Clearance: 15.2 mL/min (A) (by C-G formula based on SCr of 4.1 mg/dL (H)).  Liver Function Tests: Recent Labs  Lab 02/09/24 1833 02/10/24 0154  AST 17 16  ALT 12 9  ALKPHOS 43 38  BILITOT 0.3 0.5  PROT 5.6* 4.9*  ALBUMIN  3.6 3.1*   Coagulation Profile: Recent Labs  Lab 02/09/24 1833  INR 1.2    Thyroid  Function Tests: Recent Labs    02/08/24 1201  TSH 20.300*  FREET4 1.16    Anemia Panel: Recent Labs    02/09/24 1833  VITAMINB12 2,174*  FOLATE 13.8  FERRITIN 67  TIBC 301  IRON 27*  RETICCTPCT 8.9*     Radiology Studies: CT ABDOMEN PELVIS WO CONTRAST Result Date: 02/09/2024 CLINICAL DATA:  Gross hematuria. Known uroepithelial cancer. history of prior left nephrectomy, cystoprostatectomy with urinary diversion. * Tracking Code: BO * EXAM: CT ABDOMEN AND PELVIS WITHOUT CONTRAST TECHNIQUE: Multidetector  CT imaging of the abdomen and pelvis was performed following the standard protocol without IV contrast. RADIATION DOSE REDUCTION: This exam was performed according to the departmental dose-optimization program which includes automated exposure control, adjustment of the mA and/or kV according to patient size and/or use of iterative reconstruction technique. COMPARISON:  CT scan 03/12/2022 FINDINGS: Lower chest: Moderate-sized right pleural effusion with overlying atelectasis. No worrisome pulmonary lesions or pulmonary nodules. Stable aortic calcifications. Pacer wires are noted. Hepatobiliary: There are at least 4 new hepatic lesions. 17 mm lesion at the right hepatic dome in segment 8. Anterior segment 3 lesion on image 21/3 measures 15 mm. Slightly more medially and inferiorly is a second lesion measuring 10 mm. There is also a lesion and segment 5 peripherally measuring 13 mm. Findings consistent with metastatic disease. There are also small cysts which are stable. No intrahepatic  biliary dilatation. Calcified gallstones are noted in a contracted gallbladder. No common bile duct dilatation. Pancreas: No mass, inflammation or ductal dilatation. Spleen: Normal size.  No focal lesions. Adrenals/Urinary Tract: The adrenal glands are normal. The left kidney is surgically absent. The right kidney demonstrates hydronephrosis and duplicated collecting system. Extensive high attenuation material in the collecting system and in the upper left ureter with masslike area measuring a maximum of 3 cm. Findings are progressive when compared to the prior study and are consistent with uroepithelial neoplasm. Some of this could also be blood/hematoma. Status post cysto prostatectomy with a right-sided urinary diversion. No complicating features. Stomach/Bowel: The stomach, duodenum, small bowel and colon are grossly normal. No obvious mass lesions or obstructive findings. Patient is a very large anterior abdominal wall hernia containing a significant amount of bowel but no findings for incarceration or obstruction. Vascular/Lymphatic: Advanced atherosclerotic calcification involving aorta, iliac arteries and branch vessels. New retroperitoneal lymphadenopathy. Right-sided node on image 29/3 measures 15 mm and left-sided node on image 32/3 measures 12 mm. No pelvic adenopathy. There is an enlarged right inguinal node measuring 17 mm. Reproductive: The prostate gland and seminal vesicles are surgically absent. Other: None Musculoskeletal: No definite lytic or sclerotic bone lesions to suggest metastatic disease. Healing/healed right seventh, eighth and ninth rib fractures. IMPRESSION: 1. Status post left nephrectomy and cystoprostatectomy with a right-sided urinary diversion. No complicating features. 2. Progressive high attenuation material in the right renal collecting system and upper right ureter with masslike area measuring a maximum of 3 cm. 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 Some of this  could also be blood/hematoma. 3. New retroperitoneal and right inguinal lymphadenopathy consistent with metastatic disease. 4. New hepatic lesions consistent with metastatic disease. 5. Moderate-sized right pleural effusion with overlying atelectasis. 6. Cholelithiasis. 7. Very large anterior abdominal wall hernia containing a significant amount of bowel but no findings for incarceration or obstruction. 8. Healing/healed right seventh, eighth and ninth rib fractures. 9. Aortic atherosclerosis. Aortic Atherosclerosis (ICD10-I70.0). Electronically Signed   By: MYRTIS Stammer M.D.   On: 02/09/2024 18:52   ECHOCARDIOGRAM COMPLETE Result Date: 02/08/2024    ECHOCARDIOGRAM REPORT   Patient Name:   Jordan Oconnell Date of Exam: 02/08/2024 Medical Rec #:  [id number]         Height:       73.0 in Accession #:    [id number]        Weight:       190.6 lb Date of Birth:  1940-09-15         BSA:          2.108 m Patient  Age:    83 years          BP:           95/51 mmHg Patient Gender: M                 HR:           65 bpm. Exam Location:  [address] Procedure: 2D Echo, Cardiac Doppler and Color Doppler (Both Spectral and Color            Flow Doppler were utilized during procedure). Indications:    I35.1 Nonrheumatic aortic (valve) insufficiency  History:        Patient has prior history of Echocardiogram examinations, most                 recent 03/06/2023. Risk Factors:Hypertension.                 Aortic Valve: 29 mm Edwards Sapien prosthetic, stented (TAVR)                 valve is present in the aortic position. Procedure Date:                 01/27/2021.  Sonographer:    Augustin Seals RDCS Referring Phys: [id number] MIHAI CROITORU IMPRESSIONS  1. Left ventricular ejection fraction, by estimation, is 50 to 55%. The left ventricle has low normal function. The left ventricle has no regional wall motion abnormalities. There is moderate left ventricular hypertrophy. Left ventricular diastolic parameters are consistent with  Grade I diastolic dysfunction (impaired relaxation).  2. Right ventricular systolic function is normal. The right ventricular size is mildly enlarged.  3. Left atrial size was moderately dilated.  4. The mitral valve is normal in structure. Mild mitral valve regurgitation. No evidence of mitral stenosis.  5. The aortic valve has been repaired/replaced. Aortic valve regurgitation is not visualized. No aortic stenosis is present. There is a 29 mm Edwards Sapien prosthetic, stented Sapien prosthetic (TAVR) valve present in the aortic position. Procedure Date: 01/27/2021. Echo findings are consistent with normal structure and function of the aortic valve prosthesis. Aortic valve mean gradient measures 13.0 mmHg. Aortic valve Vmax measures 2.53 m/s.  6. The inferior vena cava is normal in size with greater than 50% respiratory variability, suggesting right atrial pressure of 3 mmHg. FINDINGS  Left Ventricle: Left ventricular ejection fraction, by estimation, is 50 to 55%. The left ventricle has low normal function. The left ventricle has no regional wall motion abnormalities. The left ventricular internal cavity size was normal in size. There is moderate left ventricular hypertrophy. Left ventricular diastolic parameters are consistent with Grade I diastolic dysfunction (impaired relaxation). Right Ventricle: The right ventricular size is mildly enlarged. No increase in right ventricular wall thickness. Right ventricular systolic function is normal. Left Atrium: Left atrial size was moderately dilated. Right Atrium: Right atrial size was normal in size. Pericardium: There is no evidence of pericardial effusion. Mitral Valve: The mitral valve is normal in structure. Mild mitral valve regurgitation. No evidence of mitral valve stenosis. Tricuspid Valve: The tricuspid valve is normal in structure. Tricuspid valve regurgitation is trivial. No evidence of tricuspid stenosis. Aortic Valve: The aortic valve has been  repaired/replaced. Aortic valve regurgitation is not visualized. No aortic stenosis is present. Aortic valve mean gradient measures 13.0 mmHg. Aortic valve peak gradient measures 25.6 mmHg. Aortic valve area, by VTI measures 2.04 cm. There is a 29 mm Edwards Sapien prosthetic, stented Sapien prosthetic, stented (TAVR) valve  present in the aortic position. Procedure Date: 01/27/2021. Echo findings are consistent with normal structure and function of the aortic valve prosthesis. Pulmonic Valve: The pulmonic valve was normal in structure. Pulmonic valve regurgitation is not visualized. No evidence of pulmonic stenosis. Aorta: The aortic root is normal in size and structure. Venous: The inferior vena cava is normal in size with greater than 50% respiratory variability, suggesting right atrial pressure of 3 mmHg. IAS/Shunts: No atrial level shunt detected by color flow Doppler. Additional Comments: A device lead is visualized in the right ventricle.  LEFT VENTRICLE PLAX 2D LVIDd:         4.95 cm      Diastology LVIDs:         3.35 cm      LV e' medial:   5.92 cm/s LV PW:         1.30 cm      LV E/e' medial: 13.9 LV IVS:        1.45 cm LVOT diam:     2.40 cm LV SV:         108 LV SV Index:   51 LVOT Area:     4.52 cm  LV Volumes (MOD) LV vol d, MOD A2C: 128.0 ml LV vol d, MOD A4C: 99.6 ml LV vol s, MOD A2C: 56.9 ml LV vol s, MOD A4C: 50.8 ml LV SV MOD A2C:     71.1 ml LV SV MOD A4C:     99.6 ml LV SV MOD BP:      61.8 ml RIGHT VENTRICLE RV Basal diam:  4.50 cm     PULMONARY VEINS RV Mid diam:    3.60 cm     A Reversal Velocity: 32.30 cm/s RV S prime:     13.40 cm/s  Diastolic Velocity:  48.80 cm/s TAPSE (M-mode): 2.7 cm      S/D Velocity:        1.20                             Systolic Velocity:   60.30 cm/s LEFT ATRIUM             Index        RIGHT ATRIUM           Index LA diam:        4.60 cm 2.18 cm/m   RA Area:     17.60 cm LA Vol (A2C):   70.4 ml 33.40 ml/m  RA Volume:   45.90 ml  21.77 ml/m LA Vol (A4C):    86.4 ml 40.99 ml/m LA Biplane Vol: 81.2 ml 38.52 ml/m  AORTIC VALVE AV Area (Vmax):    2.02 cm AV Area (Vmean):   2.14 cm AV Area (VTI):     2.04 cm AV Vmax:           253.00 cm/s AV Vmean:          165.000 cm/s AV VTI:            0.527 m AV Peak Grad:      25.6 mmHg AV Mean Grad:      13.0 mmHg LVOT Vmax:         113.00 cm/s LVOT Vmean:        78.100 cm/s LVOT VTI:          0.238 m LVOT/AV VTI ratio: 0.45  AORTA Ao Root diam: 4.30 cm Ao Asc diam:  4.30 cm MITRAL VALVE MV Area (PHT): 3.48 cm     SHUNTS MV Decel Time: 218 msec     Systemic VTI:  0.24 m MV E velocity: 82.30 cm/s   Systemic Diam: 2.40 cm MV A velocity: 112.50 cm/s MV E/A ratio:  0.73 Oneil Parchment MD Electronically signed by Oneil Parchment MD Signature Date/Time: 02/08/2024/2:10:24 PM    Final        LOS: 0 days   Joette Pebbles  [facility] Pager on [email or web address]  02/10/2024, 10:49 AM   "

## 2024-02-10 NOTE — Plan of Care (Signed)

## 2024-02-10 NOTE — Consult Note (Signed)
 "  Urology Consult Note   Requesting Attending Physician:  Verdene Purchase, MD Service Providing Consult: Urology  Consulting Attending: Dr. Watt   Reason for Consult: Hematuria, known right ureteral mass  HPI: Jordan Oconnell is seen in consultation for reasons noted above at the request of Verdene Purchase, MD. Patient is a 84 y.o. male presenting to [facility] as a direct admission from cardiology office.  Patient has been feeling fatigued for the last 1 to 2 weeks and reported gross hematuria since early December.  Assuming this was infectious, he completed a remaining course of Cipro  he had on hand without improvement.  On arrival serum creatinine was 3.35 with baseline appearing to be around 2.8-3.2 per notes from his oncologist.  Hemoglobin was 6.9 and dropped to 5.5 the following morning.    Very complex medical picture.  He has been in the [facility] system for over a decade. 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  He declined right nephro ureterectomy with [facility] and elected to complete 1 year of pembrolizumab , last dose 04/21/22.  His right mass increased in size and he completed a course of XRT-ending 12/04/2021.  On my arrival patient was resting comfortably in bed having an ultrasound IV placed.  He was an excellent historian and very pleasant.  He was alert, oriented, and in no distress.  His hematuria was ongoing with small clot passage through his urostomy.  ------------------  Assessment:   84 y.o. male with solitary right kidney and high-grade carcinoma of the right ureter refractory to therapy.  Severe CKD stage IV-GFR below 20.  S/p left neph-u and cystoprostatectomy with ileal  conduit, now experiencing hematuria on Eliquis .   Recommendations: # Solitary right kidney with known high-grade carcinoma of ureter # Chronic anticoagulation # Gross hematuria with clot in ileal conduit  Color contaminated urinalysis on arrival.  Many bacteria present which would be expected in a urostomy.  Chronic colonization versus hemorrhagic cystitis.  Considering his severe anemia and complicated cardiac history, it is reasonable to treat him prophylactically for cystitis.  Continue to washout Eliquis  is medically reasonable.  Per his notes, this mass has been present for some time and is known to be a recurrent high-grade cancer for which he has received chemotherapeutics and radiation.  No acute urologic intervention at this time. He is unlikely to be a surgical or dialysis candidate at this stage.  Urology will follow  Case and plan discussed with Dr. Watt  Past Medical History: Past Medical History:  Diagnosis Date   Acute on chronic systolic CHF (congestive heart failure) (HCC) 03/13/2022   Acute renal failure superimposed on stage 4 chronic kidney disease (HCC) 03/13/2022   Atrial fibrillation with rapid ventricular response (HCC) 03/11/2022   Bladder cancer (HCC)    BPH (benign prostatic hyperplasia)    CAD (coronary artery disease)    CKD (chronic kidney disease), stage IV (HCC)    Colon polyps    Diverticulosis    GERD (gastroesophageal reflux disease)    Hyperchloremic metabolic acidosis    Hyperlipidemia    pt denies   Hypertension    LBBB (left bundle branch block)    Osteomyelitis of left foot (HCC) 04/26/2023   PAF (paroxysmal atrial fibrillation) (HCC)    Presence of permanent cardiac pacemaker  Renal cell carcinoma (HCC) 2008   left   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 Wonda and Dr. Lucas   Severe aortic stenosis 10/05/2020   Tachy-brady syndrome (HCC)     Toe pain, left 04/15/2023    Past Surgical History:  Past Surgical History:  Procedure Laterality Date   AMPUTATION TOE Right 03/02/2023   Procedure: AMPUTATION TOE, 4th;  Surgeon: Barton Drape, MD;  Location: [facility];  Service: Orthopedics;  Laterality: Right;   AMPUTATION TOE Left 05/04/2023   Procedure: AMPUTATION, TOE;  Surgeon: Barton Drape, MD;  Location: [facility];  Service: Orthopedics;  Laterality: Left;  left foot 4th toe metatarsalphalangeal joint disarticulation   APPENDECTOMY     BLADDER SURGERY  01/25/2006   transurethral resection/resection of prostatic urethra   BOWEL RESECTION     CARDIOVERSION N/A 03/03/2022   Procedure: CARDIOVERSION;  Surgeon: Lonni Slain, MD;  Location: [facility] ENDOSCOPY;  Service: Cardiovascular;  Laterality: N/A;   CARDIOVERSION N/A 03/12/2022   Procedure: CARDIOVERSION;  Surgeon: Sheena Pugh, DO;  Location: [facility] ENDOSCOPY;  Service: Cardiovascular;  Laterality: N/A;   ESOPHAGOGASTRODUODENOSCOPY N/A 07/24/2013   Procedure: ESOPHAGOGASTRODUODENOSCOPY (EGD);  Surgeon: Norleen LOISE Kiang, MD;  Location: [facility];  Service: Endoscopy;  Laterality: N/A;   INTRAOPERATIVE TRANSTHORACIC ECHOCARDIOGRAM N/A 01/27/2021   Procedure: INTRAOPERATIVE TRANSTHORACIC ECHOCARDIOGRAM;  Surgeon: Wonda Sharper, MD;  Location: [facility] INVASIVE CV LAB;  Service: Open Heart Surgery;  Laterality: N/A;   IR FLUORO GUIDE CV LINE RIGHT  03/04/2023   IR REMOVAL TUN CV CATH W/O FL  05/09/2023   IR US  GUIDE VASC ACCESS RIGHT  03/04/2023   laparoscopic surgery  01/25/2010   laser   (?)   NEPHRECTOMY  01/25/2005   partial, left   NEPHROSTOMY  01/25/2009   stent   NM MYOCAR PERF WALL MOTION  09/08/2010   Normal   PACEMAKER IMPLANT N/A 02/12/2021   Procedure: PACEMAKER IMPLANT;  Surgeon: Cindie Ole DASEN, MD;  Location: [facility] INVASIVE CV LAB;  Service: Cardiovascular;  Laterality: N/A;   RIGHT HEART CATH AND CORONARY ANGIOGRAPHY N/A 10/06/2020   Procedure: RIGHT HEART CATH  AND CORONARY ANGIOGRAPHY;  Surgeon: Wonda Sharper, MD;  Location: [facility] INVASIVE CV LAB;  Service: Cardiovascular;  Laterality: N/A;   ROBOT ASSISTED LAPAROSCOPIC COMPLETE CYSTECT ILEAL CONDUIT     TRANSCATHETER AORTIC VALVE REPLACEMENT, TRANSFEMORAL Right 01/27/2021   Procedure: TRANSCATHETER AORTIC VALVE REPLACEMENT, TRANSFEMORAL;  Surgeon: Wonda Sharper, MD;  Location: [facility] INVASIVE CV LAB;  Service: Open Heart Surgery;  Laterality: Right;   US  ECHOCARDIOGRAPHY  09/08/2010   mild diastolic dysfunction,mild dilated LA,mild MR,AI,mildly dilated aortic root    Medication: Current Facility-Administered Medications  Medication Dose Route Frequency Provider Last Rate Last Admin   0.9 %  sodium chloride  infusion (Manually program via Guardrails IV Fluids)   Intravenous Once Verdene Purchase, MD       0.9 %  sodium chloride  infusion   Intravenous Continuous Krishnan, Gokul, MD 75 mL/hr at 02/10/24 0917 New Bag at 02/10/24 0917   acetaminophen  (TYLENOL ) tablet 650 mg  650 mg Oral Q6H PRN Seena Marsa NOVAK, MD       Or   acetaminophen  (TYLENOL ) suppository 650 mg  650 mg Rectal Q6H PRN Seena Marsa NOVAK, MD       ALPRAZolam  (XANAX ) tablet 0.25 mg  0.25 mg Oral QHS Melvin, Alexander B, MD   0.25 mg at 02/09/24 2129  amiodarone  (PACERONE ) tablet 200 mg  200 mg Oral Daily Melvin, Alexander B, MD   200 mg at 02/10/24 9083   atorvastatin  (LIPITOR) tablet 10 mg  10 mg Oral QHS Melvin, Alexander B, MD       carvedilol  (COREG ) tablet 12.5 mg  12.5 mg Oral BID PC Melvin, Alexander B, MD   12.5 mg at 02/10/24 0809   feeding supplement (ENSURE ENLIVE / ENSURE PLUS) liquid 237 mL  237 mL Oral BID BM Melvin, Alexander B, MD       furosemide  (LASIX ) injection 20 mg  20 mg Intravenous Once Krishnan, Gokul, MD       isosorbide  mononitrate (IMDUR ) 24 hr tablet 30 mg  30 mg Oral Daily Melvin, Alexander B, MD   30 mg at 02/10/24 9083   levothyroxine  (SYNTHROID ) tablet 200 mcg  200 mcg Oral QAC breakfast Kakrakandy,  Arshad N, MD   200 mcg at 02/10/24 9377   mirtazapine  (REMERON ) tablet 7.5 mg  7.5 mg Oral QHS Melvin, Alexander B, MD   7.5 mg at 02/09/24 2128   polyethylene glycol (MIRALAX  / GLYCOLAX ) packet 17 g  17 g Oral Daily PRN Seena Marsa NOVAK, MD       sodium chloride  flush (NS) 0.9 % injection 3 mL  3 mL Intravenous Q12H Seena Marsa NOVAK, MD   3 mL at 02/10/24 9081    Allergies: Allergies[1]  Social History: Social History[2]  Family History Family History  Problem Relation Age of Onset   Ovarian cancer Mother    Colon cancer Mother    Colon cancer Father    Crohn's disease Son    Breast cancer Daughter    Colon cancer Brother     Review of Systems  Genitourinary:  Positive for hematuria. Negative for dysuria, flank pain, frequency and urgency.     Objective   Vital signs in last 24 hours: BP (!) 114/59   Pulse 70   Temp 98.1 F (36.7 C)   Resp 20   Ht 6' 1 (1.854 m)   Wt 84.1 kg   SpO2 97%   BMI 24.47 kg/m   Physical Exam General: A&O, resting, appropriate HEENT: [place]/AT Pulmonary: Normal work of breathing Cardiovascular: no cyanosis GU: Urostomy beefy pink draining light red urine with passage of small soft clots.   Most Recent Labs: Lab Results  Component Value Date   WBC 3.7 (L) 02/10/2024   HGB 7.2 (L) 02/10/2024   HCT 23.4 (L) 02/10/2024   PLT 89 (L) 02/10/2024    Lab Results  Component Value Date   NA 137 02/10/2024   K 4.5 02/10/2024   CL 107 02/10/2024   CO2 19 (L) 02/10/2024   BUN 63 (H) 02/10/2024   CREATININE 4.10 (H) 02/10/2024   CALCIUM  8.1 (L) 02/10/2024   MG 2.3 02/09/2024   PHOS 2.9 03/10/2023    Lab Results  Component Value Date   INR 1.2 02/09/2024   APTT 91 (H) 03/08/2023     Urine Culture: @[id number] (laburin,org,[id number])@   IMAGING: CT ABDOMEN PELVIS WO CONTRAST Result Date: 02/09/2024 CLINICAL DATA:  Gross hematuria. Known uroepithelial cancer. history of prior left nephrectomy, cystoprostatectomy with  urinary diversion. * Tracking Code: BO * EXAM: CT ABDOMEN AND PELVIS WITHOUT CONTRAST TECHNIQUE: Multidetector CT imaging of the abdomen and pelvis was performed following the standard protocol without IV contrast. RADIATION DOSE REDUCTION: This exam was performed according to the departmental dose-optimization program which includes automated exposure control, adjustment of the mA and/or kV according  to patient size and/or use of iterative reconstruction technique. COMPARISON:  CT scan 03/12/2022 FINDINGS: Lower chest: Moderate-sized right pleural effusion with overlying atelectasis. No worrisome pulmonary lesions or pulmonary nodules. Stable aortic calcifications. Pacer wires are noted. Hepatobiliary: There are at least 4 new hepatic lesions. 17 mm lesion at the right hepatic dome in segment 8. Anterior segment 3 lesion on image 21/3 measures 15 mm. Slightly more medially and inferiorly is a second lesion measuring 10 mm. There is also a lesion and segment 5 peripherally measuring 13 mm. Findings consistent with metastatic disease. There are also small cysts which are stable. No intrahepatic biliary dilatation. Calcified gallstones are noted in a contracted gallbladder. No common bile duct dilatation. Pancreas: No mass, inflammation or ductal dilatation. Spleen: Normal size.  No focal lesions. Adrenals/Urinary Tract: The adrenal glands are normal. The left kidney is surgically absent. The right kidney demonstrates hydronephrosis and duplicated collecting system. Extensive high attenuation material in the collecting system and in the upper left ureter with masslike area measuring a maximum of 3 cm. Findings are progressive when compared to the prior study and are consistent with uroepithelial neoplasm. Some of this could also be blood/hematoma. Status post cysto prostatectomy with a right-sided urinary diversion. No complicating features. Stomach/Bowel: The stomach, duodenum, small bowel and colon are grossly  normal. No obvious mass lesions or obstructive findings. Patient is a very large anterior abdominal wall hernia containing a significant amount of bowel but no findings for incarceration or obstruction. Vascular/Lymphatic: Advanced atherosclerotic calcification involving aorta, iliac arteries and branch vessels. New retroperitoneal lymphadenopathy. Right-sided node on image 29/3 measures 15 mm and left-sided node on image 32/3 measures 12 mm. No pelvic adenopathy. There is an enlarged right inguinal node measuring 17 mm. Reproductive: The prostate gland and seminal vesicles are surgically absent. Other: None Musculoskeletal: No definite lytic or sclerotic bone lesions to suggest metastatic disease. Healing/healed right seventh, eighth and ninth rib fractures. IMPRESSION: 1. Status post left nephrectomy and cystoprostatectomy with a right-sided urinary diversion. No complicating features. 2. Progressive high attenuation material in the right renal collecting system and upper right ureter with masslike area measuring a maximum of 3 cm. 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 Some of this could also be blood/hematoma. 3. New retroperitoneal and right inguinal lymphadenopathy consistent with metastatic disease. 4. New hepatic lesions consistent with metastatic disease. 5. Moderate-sized right pleural effusion with overlying atelectasis. 6. Cholelithiasis. 7. Very large anterior abdominal wall hernia containing a significant amount of bowel but no findings for incarceration or obstruction. 8. Healing/healed right seventh, eighth and ninth rib fractures. 9. Aortic atherosclerosis. Aortic Atherosclerosis (ICD10-I70.0). Electronically Signed   By: MYRTIS Stammer M.D.   On: 02/09/2024 18:52    ------  Ole Bourdon, NP Pager: [phone]   Please contact the urology consult pager with any further questions/concerns.     [1]  Allergies Allergen Reactions   Amoxicillin Other (See Comments)     Acute interstitial nephritis   Doxycycline Hives   Flomax [Tamsulosin Hcl] Other (See Comments)    Dizziness  [2]  Social History Tobacco Use   Smoking status: Never   Smokeless tobacco: Never  Vaping Use   Vaping status: Never Used  Substance Use Topics   Alcohol  use: Not Currently    Comment: occasionally   Drug use: No   "

## 2024-02-10 NOTE — Progress Notes (Signed)
 Date and time results received: 02/09/24 1940 (use smartphrase .now to insert current time)  Test: Hgb Critical Value: 5.5  Name of Provider Notified: Dr. Franky  Orders Received? Or Actions Taken?: Actions Taken: Blood transfusion per order

## 2024-02-11 DIAGNOSIS — I480 Paroxysmal atrial fibrillation: Secondary | ICD-10-CM | POA: Diagnosis not present

## 2024-02-11 DIAGNOSIS — R310 Gross hematuria: Secondary | ICD-10-CM | POA: Diagnosis not present

## 2024-02-11 DIAGNOSIS — D62 Acute posthemorrhagic anemia: Secondary | ICD-10-CM | POA: Diagnosis not present

## 2024-02-11 LAB — CBC WITH DIFFERENTIAL/PLATELET
Abs Immature Granulocytes: 0.13 K/uL — ABNORMAL HIGH (ref 0.00–0.07)
Basophils Absolute: 0 K/uL (ref 0.0–0.1)
Basophils Relative: 1 %
Eosinophils Absolute: 0.1 K/uL (ref 0.0–0.5)
Eosinophils Relative: 1 %
HCT: 26 % — ABNORMAL LOW (ref 39.0–52.0)
Hemoglobin: 8.5 g/dL — ABNORMAL LOW (ref 13.0–17.0)
Immature Granulocytes: 3 %
Lymphocytes Relative: 21 %
Lymphs Abs: 0.9 K/uL (ref 0.7–4.0)
MCH: 32.4 pg (ref 26.0–34.0)
MCHC: 32.7 g/dL (ref 30.0–36.0)
MCV: 99.2 fL (ref 80.0–100.0)
Monocytes Absolute: 0.4 K/uL (ref 0.1–1.0)
Monocytes Relative: 10 %
Neutro Abs: 2.5 K/uL (ref 1.7–7.7)
Neutrophils Relative %: 64 %
Platelets: 86 K/uL — ABNORMAL LOW (ref 150–400)
RBC: 2.62 MIL/uL — ABNORMAL LOW (ref 4.22–5.81)
RDW: 20.2 % — ABNORMAL HIGH (ref 11.5–15.5)
WBC: 4 K/uL (ref 4.0–10.5)
nRBC: 0 % (ref 0.0–0.2)

## 2024-02-11 LAB — BASIC METABOLIC PANEL WITH GFR
Anion gap: 11 (ref 5–15)
BUN: 56 mg/dL — ABNORMAL HIGH (ref 8–23)
CO2: 19 mmol/L — ABNORMAL LOW (ref 22–32)
Calcium: 8.2 mg/dL — ABNORMAL LOW (ref 8.9–10.3)
Chloride: 110 mmol/L (ref 98–111)
Creatinine, Ser: 3.66 mg/dL — ABNORMAL HIGH (ref 0.61–1.24)
GFR, Estimated: 16 mL/min — ABNORMAL LOW
Glucose, Bld: 97 mg/dL (ref 70–99)
Potassium: 4 mmol/L (ref 3.5–5.1)
Sodium: 140 mmol/L (ref 135–145)

## 2024-02-11 LAB — HEMOGLOBIN AND HEMATOCRIT, BLOOD
HCT: 27.5 % — ABNORMAL LOW (ref 39.0–52.0)
Hemoglobin: 8.7 g/dL — ABNORMAL LOW (ref 13.0–17.0)

## 2024-02-11 LAB — GLUCOSE, CAPILLARY: Glucose-Capillary: 110 mg/dL — ABNORMAL HIGH (ref 70–99)

## 2024-02-11 LAB — MAGNESIUM: Magnesium: 2.1 mg/dL (ref 1.7–2.4)

## 2024-02-11 MED ORDER — SODIUM CHLORIDE 0.45 % IV SOLN: INTRAVENOUS | Status: DC

## 2024-02-11 MED ORDER — SODIUM CHLORIDE 0.9 % IV SOLN
1.0000 g | INTRAVENOUS | Status: AC
  Administered 2024-02-11 – 2024-02-15 (×5): 1 g via INTRAVENOUS
  Filled 2024-02-11 (×5): qty 10

## 2024-02-11 NOTE — Progress Notes (Signed)
 " Cardiology Progress Note  Patient ID: Jordan Oconnell MRN: [id number] DOB: [birth year] Date of Encounter: 02/11/2024 Primary Cardiologist: Jerel Balding, MD  Subjective   Chief Complaint: None.   HPI: Denies CP or SOB.   ROS:  All other ROS reviewed and negative. Pertinent positives noted in the HPI.     Telemetry  Overnight telemetry shows V paced rhythm heart rate 80s, which I personally reviewed.   Physical Exam   Vitals:   02/11/24 0031 02/11/24 0519 02/11/24 0654 02/11/24 0754  BP: 134/67 136/70 (!) 155/73 (!) 151/70  Pulse: 74 71 71 76  Resp: 18 16 16 17   Temp: 97.9 F (36.6 C) 97.9 F (36.6 C)  98.1 F (36.7 C)  TempSrc: Oral Oral  Oral  SpO2: 95% 94% 93% 95%  Weight:      Height:        Intake/Output Summary (Last 24 hours) at 02/11/2024 1150 Last data filed at 02/11/2024 1051 Gross per 24 hour  Intake 1354.85 ml  Output 3425 ml  Net -2070.15 ml       02/10/2024    7:30 AM 11/23/2023    3:30 PM 11/08/2023    8:38 AM  Last 3 Weights  Weight (lbs) 185 lb 8 oz 190 lb 9.6 oz 189 lb 9.6 oz  Weight (kg) 84.142 kg 86.456 kg 86.002 kg    Body mass index is 24.47 kg/m.  General: Well nourished, well developed, in no acute distress Head: Atraumatic, normal size  Eyes: PEERLA, EOMI  Neck: Supple, no JVD Endocrine: No thryomegaly Cardiac: Normal S1, S2; RRR; 2/6 SEM Lungs: Clear to auscultation bilaterally, no wheezing, rhonchi or rales  Abd: Soft, nontender, no hepatomegaly  Ext: No edema, pulses 2+ Musculoskeletal: No deformities, BUE and BLE strength normal and equal Skin: Warm and dry, no rashes   Neuro: Alert and oriented to person, place, time, and situation, CNII-XII grossly intact, no focal deficits  Psych: Normal mood and affect   Cardiac Studies  TTE 02/08/2024  1. Left ventricular ejection fraction, by estimation, is 50 to 55%. The  left ventricle has low normal function. The left ventricle has no regional  wall motion abnormalities. There is  moderate left ventricular hypertrophy.  Left ventricular diastolic  parameters are consistent with Grade I diastolic dysfunction (impaired  relaxation).   2. Right ventricular systolic function is normal. The right ventricular  size is mildly enlarged.   3. Left atrial size was moderately dilated.   4. The mitral valve is normal in structure. Mild mitral valve  regurgitation. No evidence of mitral stenosis.   5. The aortic valve has been repaired/replaced. Aortic valve  regurgitation is not visualized. No aortic stenosis is present. There is a  29 mm Edwards Sapien prosthetic, stented Sapien prosthetic (TAVR) valve  present in the aortic position. Procedure  Date: 01/27/2021. Echo findings are consistent with normal structure and  function of the aortic valve prosthesis. Aortic valve mean gradient  measures 13.0 mmHg. Aortic valve Vmax measures 2.53 m/s.   6. The inferior vena cava is normal in size with greater than 50%  respiratory variability, suggesting right atrial pressure of 3 mmHg.   Patient Profile  Jordan Oconnell is a 84 y.o. male with CKD4, paroxysmal atrial fibrillation, tachycardia-bradycardia status post pacemaker implant, aortic stenosis status post TAVR, diastolic heart failure, bladder cancer admitted on 02/09/2024 for symptomatic anemia.  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  Status post transfusion.  Hemoglobin being monitored by primary team. - CT abdomen pelvis with concerns for uroepithelial neoplasm with metastatic disease.  Would recommend to hold anticoagulation until this is treated.  He may not be a good candidate for anticoagulation moving forward.  Would recommend to hold until evaluated by urology and oncology.  Looks like this will take place in the outpatient setting.  # Paroxysmal A-fib # Tachycardia-bradycardia syndrome status post pacemaker -  Would recommend holding anticoagulation in setting of uroepithelial malignancy and bleeding. - Continue amiodarone  200 mg daily.  # Severe aortic stenosis status post TAVR # CAD - Hold aspirin  in setting of bleeding.  # CKD IV - Stable  # Heart Failure midrange ejection fraction, 50-55% - Euvolemic.  Lasix  as needed.  Continue Coreg  12.5 mg twice daily, Imdur  30 mg daily.  [facility] will sign off.   The patient is ready for discharge today from a cardiac standpoint. Medication Recommendations: As above Other recommendations (labs, testing, etc): None Follow up as an outpatient: 6 to 8 weeks with Dr. Francyne  For questions or updates, please contact [facility] Please consult [email or web address] for contact info under        Signed, Darryle T. Barbaraann, MD, [facility] [place]  [facility]  02/11/2024 11:50 AM   "

## 2024-02-11 NOTE — Progress Notes (Signed)
 "  [facility] PROGRESS NOTE   CHIEF WALKUP FMW:[id number] DOB: 1940-06-07 DOA: 02/09/2024  PCP: Shepard Ade, MD  Brief History: 84 y.o. male with medical history significant of hypertension, hyperlipidemia, GERD, hypothyroidism, CKD 4, solitary kidney, atrial fibrillation, tachybradycardia syndrome status post pacemaker, aortic stenosis status post TAVR, chronic diastolic CHF, depression, anemia, bladder cancer status post cystectomy and urostomy, left total nephrectomy, right partial nephrectomy, presenting with anemia and AKI versus progressive CKD as direct admission. Patient sent as a direct admission by cardiology after labs were obtained to further evaluate abnormal values on pacemaker transmission.  He did mention fatigue ongoing for 1 to 2 weeks.  He was noted to be profoundly anemic.  He was hospitalized for further management.  Consultants: Cardiology.  Urology  Procedures: None yet    Subjective/Interval History: Patient mentioned that he feels well.  Denies any chest pain shortness of breath.  No nausea vomiting.     Assessment/Plan:  Acute blood loss anemia Anemia most likely due to gross hematuria.  This is in the setting of Eliquis . Patient presented with hemoglobin of 5.5.  Has been transfused 3 units of PRBC so far.  Hemoglobin noted to be 8.5 this morning.  Will recheck this afternoon.    Gross hematuria/history of bladder cancer/urothelial cancer/RCC This is in the setting of a complicated urological history with history of bladder cancer, renal cell cancer.  He is status post cystectomy and has a urostomy.  He also is status post partial right nephrectomy and total left nephrectomy. Has had gross hematuria the last several days. Eliquis  has been held. Patient is followed at [facility] for urologic issues but wants to transfer care locally if possible.   CT scan of the abdomen pelvis raises concern for hepatic metastases.  Also showed new retroperitoneal  and right inguinal lymphadenopathy also concerning for metastatic disease.  Found to have mass in the right pelvic collecting system but this was seen on previous CT scan as well. Will need to be seen by medical oncology eventually.  No indication to involve them currently. Seen by urology yesterday.  No plans for any procedures/interventions. Hematuria seems to be subsiding.  Continue to monitor closely. Initial UA had gross blood so interpretation is limited.  Did have bacteria but his WBC is normal.  Urology did mention prophylactically treating him for cystitis but he is not noted to be on any antibiotics currently.  Noted to have allergy to amoxicillin.  Will ask pharmacy if he has tolerated cephalosporins.  If so we will initiate ceftriaxone .  Will also repeat UA.  Acute kidney injury on chronic kidney disease stage IV Patient with solitary kidney. Baseline creatinine seems to be between 2.5-3.0.  Higher creatinine noted.  Most likely due to volume loss.  Creatinine is gradually improving.  Continue IV fluids for now.  Monitor urine output.    Essential hypertension Noted to be on carvedilol , nitrates.  Looks like his amlodipine  and hydralazine  is on hold.  Blood pressure is reasonably well-controlled with occasional high readings.  Paroxysmal atrial fibrillation/atrial flutter with tachybrady/pacemaker in situ Followed by cardiology who are seeing him in the hospital. Noted to be on amiodarone . Eliquis  on hold due to anemia and gross hematuria.  Likely need to hold for the foreseeable future.  Patient understands the risk that this poses in terms of increased risk for stroke.  History of severe aortic stenosis status post TAVR Noted to be stable.  Chronic diastolic CHF/coronary artery disease Volume status seems to  be stable.  Perhaps a little hypovolemic.  Monitor ins and outs and daily weights. Recent echo showed normal LVEF. Nonsustained VT noted on telemetry this morning.   Magnesium  level 2.1.  Potassium 4.0.  He was asymptomatic.  Continue carvedilol .  Hypothyroidism Continue levothyroxine .  Cholelithiasis Incidentally noted on CT scan.  Seems to be asymptomatic.  Abdominal wall hernia Asymptomatic.  DVT Prophylaxis: SCDs Code Status: Full code Family Communication: Discussed with patient Disposition Plan: Lives by himself.  PT and OT eval.    Medications: Scheduled:  ALPRAZolam   0.25 mg Oral QHS   amiodarone   200 mg Oral Daily   atorvastatin   10 mg Oral QHS   carvedilol   12.5 mg Oral BID PC   feeding supplement  237 mL Oral BID BM   isosorbide  mononitrate  30 mg Oral Daily   levothyroxine   200 mcg Oral QAC breakfast   mirtazapine   7.5 mg Oral QHS   sodium chloride  flush  3 mL Intravenous Q12H   Continuous:   PRN:acetaminophen  **OR** acetaminophen , polyethylene glycol  Objective:  Vital Signs  Vitals:   02/11/24 0031 02/11/24 0519 02/11/24 0654 02/11/24 0754  BP: 134/67 136/70 (!) 155/73 (!) 151/70  Pulse: 74 71 71 76  Resp: 18 16 16 17   Temp: 97.9 F (36.6 C) 97.9 F (36.6 C)  98.1 F (36.7 C)  TempSrc: Oral Oral  Oral  SpO2: 95% 94% 93% 95%  Weight:      Height:        Intake/Output Summary (Last 24 hours) at 02/11/2024 1015 Last data filed at 02/11/2024 0700 Gross per 24 hour  Intake 1354.85 ml  Output 2800 ml  Net -1445.15 ml   Filed Weights   02/10/24 0730  Weight: 84.1 kg    General appearance: Awake alert.  In no distress Resp: Clear to auscultation bilaterally.  Normal effort Cardio: S1-S2 is normal regular.  No S3-S4.  No rubs murmurs or bruit GI: Abdomen is soft.  Nontender nondistended.  Ostomy is noted. No focal neurological deficits.   Lab Results:  Data Reviewed: I have personally reviewed following labs and reports of the imaging studies  CBC: Recent Labs  Lab 02/08/24 1201 02/09/24 1833 02/09/24 1833 02/10/24 0154 02/10/24 0705 02/10/24 1217 02/10/24 1818 02/11/24 0421  WBC 4.4 3.8*   --  3.7*  --   --   --  4.0  NEUTROABS  --   --   --   --   --   --   --  2.5  HGB 6.9* 5.5*   < > 6.2* 7.7* 7.2* 8.8* 8.5*  HCT 22.8* 18.8*   < > 20.1* 24.3* 23.4* 27.9* 26.0*  MCV 108* 113.3*  --  104.7*  --   --   --  99.2  PLT 132* 95*  --  89*  --   --   --  86*   < > = values in this interval not displayed.    Basic Metabolic Panel: Recent Labs  Lab 02/08/24 1201 02/09/24 1833 02/10/24 0154 02/11/24 0421 02/11/24 0730  NA 142 137 137 140  --   K 4.8 4.6 4.5 4.0  --   CL 107* 105 107 110  --   CO2 15* 19* 19* 19*  --   GLUCOSE 136* 207* 87 97  --   BUN 59* 64* 63* 56*  --   CREATININE 3.79* 4.28* 4.10* 3.66*  --   CALCIUM  8.9 8.5* 8.1* 8.2*  --   MG  --  2.3  --   --  2.1    GFR: Estimated Creatinine Clearance: 17 mL/min (A) (by C-G formula based on SCr of 3.66 mg/dL (H)).  Liver Function Tests: Recent Labs  Lab 02/09/24 1833 02/10/24 0154  AST 17 16  ALT 12 9  ALKPHOS 43 38  BILITOT 0.3 0.5  PROT 5.6* 4.9*  ALBUMIN  3.6 3.1*   Coagulation Profile: Recent Labs  Lab 02/09/24 1833  INR 1.2    Thyroid  Function Tests: Recent Labs    02/08/24 1201  TSH 20.300*  FREET4 1.16    Anemia Panel: Recent Labs    02/09/24 1833  VITAMINB12 2,174*  FOLATE 13.8  FERRITIN 67  TIBC 301  IRON 27*  RETICCTPCT 8.9*     Radiology Studies: CT ABDOMEN PELVIS WO CONTRAST Result Date: 02/09/2024 CLINICAL DATA:  Gross hematuria. Known uroepithelial cancer. history of prior left nephrectomy, cystoprostatectomy with urinary diversion. * Tracking Code: BO * EXAM: CT ABDOMEN AND PELVIS WITHOUT CONTRAST TECHNIQUE: Multidetector CT imaging of the abdomen and pelvis was performed following the standard protocol without IV contrast. RADIATION DOSE REDUCTION: This exam was performed according to the departmental dose-optimization program which includes automated exposure control, adjustment of the mA and/or kV according to patient size and/or use of iterative reconstruction  technique. COMPARISON:  CT scan 03/12/2022 FINDINGS: Lower chest: Moderate-sized right pleural effusion with overlying atelectasis. No worrisome pulmonary lesions or pulmonary nodules. Stable aortic calcifications. Pacer wires are noted. Hepatobiliary: There are at least 4 new hepatic lesions. 17 mm lesion at the right hepatic dome in segment 8. Anterior segment 3 lesion on image 21/3 measures 15 mm. Slightly more medially and inferiorly is a second lesion measuring 10 mm. There is also a lesion and segment 5 peripherally measuring 13 mm. Findings consistent with metastatic disease. There are also small cysts which are stable. No intrahepatic biliary dilatation. Calcified gallstones are noted in a contracted gallbladder. No common bile duct dilatation. Pancreas: No mass, inflammation or ductal dilatation. Spleen: Normal size.  No focal lesions. Adrenals/Urinary Tract: The adrenal glands are normal. The left kidney is surgically absent. The right kidney demonstrates hydronephrosis and duplicated collecting system. Extensive high attenuation material in the collecting system and in the upper left ureter with masslike area measuring a maximum of 3 cm. Findings are progressive when compared to the prior study and are consistent with uroepithelial neoplasm. Some of this could also be blood/hematoma. Status post cysto prostatectomy with a right-sided urinary diversion. No complicating features. Stomach/Bowel: The stomach, duodenum, small bowel and colon are grossly normal. No obvious mass lesions or obstructive findings. Patient is a very large anterior abdominal wall hernia containing a significant amount of bowel but no findings for incarceration or obstruction. Vascular/Lymphatic: Advanced atherosclerotic calcification involving aorta, iliac arteries and branch vessels. New retroperitoneal lymphadenopathy. Right-sided node on image 29/3 measures 15 mm and left-sided node on image 32/3 measures 12 mm. No pelvic  adenopathy. There is an enlarged right inguinal node measuring 17 mm. Reproductive: The prostate gland and seminal vesicles are surgically absent. Other: None Musculoskeletal: No definite lytic or sclerotic bone lesions to suggest metastatic disease. Healing/healed right seventh, eighth and ninth rib fractures. IMPRESSION: 1. Status post left nephrectomy and cystoprostatectomy with a right-sided urinary diversion. No complicating features. 2. Progressive high attenuation material in the right renal collecting system and upper right ureter with masslike area measuring a maximum of 3 cm. 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 Some of this could also be blood/hematoma. 3. New  retroperitoneal and right inguinal lymphadenopathy consistent with metastatic disease. 4. New hepatic lesions consistent with metastatic disease. 5. Moderate-sized right pleural effusion with overlying atelectasis. 6. Cholelithiasis. 7. Very large anterior abdominal wall hernia containing a significant amount of bowel but no findings for incarceration or obstruction. 8. Healing/healed right seventh, eighth and ninth rib fractures. 9. Aortic atherosclerosis. Aortic Atherosclerosis (ICD10-I70.0). Electronically Signed   By: MYRTIS Stammer M.D.   On: 02/09/2024 18:52       LOS: 1 day   Kiffany Schelling  [facility] Pager on [email or web address]  02/11/2024, 10:15 AM   "

## 2024-02-11 NOTE — Progress Notes (Signed)
 Telemetry noted 14 beat run of VTach. Pt asymptomatic, denies chest pain, shortness of breath, dizziness or palpitation. VS stable. Rhythm spontaneously converted to baseline. MD notified and order for Magnesium  lab. Will continue to monitor pt.   02/11/24 0654  Vitals  BP (!) 155/73  MAP (mmHg) 94  BP Location Left Arm  BP Method Automatic  Pulse Rate 71  ECG Heart Rate 72  Resp 16  MEWS COLOR  MEWS Score Color Green  Oxygen Therapy  SpO2 93 %  O2 Device Room Air  MEWS Score  MEWS Temp 0  MEWS Systolic 0  MEWS Pulse 0  MEWS RR 0  MEWS LOC 0  MEWS Score 0

## 2024-02-11 NOTE — Plan of Care (Signed)
   Problem: Activity: Goal: Risk for activity intolerance will decrease Outcome: Progressing   Problem: Nutrition: Goal: Adequate nutrition will be maintained Outcome: Progressing   Problem: Coping: Goal: Level of anxiety will decrease Outcome: Progressing   Problem: Elimination: Goal: Will not experience complications related to bowel motility Outcome: Progressing Goal: Will not experience complications related to urinary retention Outcome: Progressing

## 2024-02-11 NOTE — Evaluation (Signed)
 Physical Therapy Evaluation Patient Details Name: Jordan Oconnell MRN: [id number] DOB: 1940-06-13 Today's Date: 02/11/2024  History of Present Illness  Pt is an 84 y.o. M presenting to [facility] on 02/09/24 from cardiology for evaluation of abnormal values on pacemaker transmission. Pt found to be severely anemic. PMH is significant for GERD, HLD, HTN, hypothyroidism, CKD, tachybradycardia syndrome, anemia, AKI, CKD.   Clinical Impression  Prior to admittance, pt was ambulating independently for household distances and would occasionally use SPC when ambulating community level distances; pt is indep with ADLs. Pt presents to evaluation with deficits in mobility, activity tolerance, power, and balance. Pt was able to ambulate household level distances with AD and no physical assistance. Pt was encouraged to ambulate frequently with staff to optimize mobility and retain strength, with pt verbalizing understanding. Dicussed possibility of OPPT with pt declining, feeling as though he will not need it. PT will continue to treat pt while he is admitted. Anticipate no PT needs at discharge.       If plan is discharge home, recommend the following: Assist for transportation   Can travel by private vehicle        Equipment Recommendations None recommended by PT  Recommendations for Other Services       Functional Status Assessment Patient has had a recent decline in their functional status and demonstrates the ability to make significant improvements in function in a reasonable and predictable amount of time.     Precautions / Restrictions Precautions Precautions: Fall Recall of Precautions/Restrictions: Intact Restrictions Weight Bearing Restrictions Per Provider Order: No      Mobility  Bed Mobility Overal bed mobility: Modified Independent             General bed mobility comments: supine > sit on L side of bed with HOB elevated, and sit > supine with HOB flat. No physical assistance  needed. Increased time to complete.    Transfers Overall transfer level: Needs assistance Equipment used: None Transfers: Sit to/from Stand Sit to Stand: Contact guard assist           General transfer comment: STS from EOB, pushing up from bed with BUE and no physical assistance. Pt then transfers LUE to IV pole and RUE to [facility].    Ambulation/Gait Ambulation/Gait assistance: Supervision Gait Distance (Feet): 250 Feet Assistive device: Straight cane, IV Pole Gait Pattern/deviations: Step-through pattern, Decreased stride length, Trunk flexed, Knee flexed in stance - right, Knee flexed in stance - left Gait velocity: reduced Gait velocity interpretation: <1.8 ft/sec, indicate of risk for recurrent falls   General Gait Details: Pt demonstrates reciprocal gait pattern with knees resting in slight bilateral flexion in stance. Pt initially requesting to use eva walker but willing to use SPC and then reaches for IV pole. No signs of instability or LOB.  Stairs            Wheelchair Mobility     Tilt Bed    Modified Rankin (Stroke Patients Only)       Balance Overall balance assessment: Needs assistance Sitting-balance support: No upper extremity supported, Feet supported Sitting balance-Leahy Scale: Good Sitting balance - Comments: seated EOB   Standing balance support: Bilateral upper extremity supported, During functional activity, Reliant on assistive device for balance Standing balance-Leahy Scale: Poor Standing balance comment: reliant on external support                             Pertinent Vitals/Pain  Pain Assessment Pain Assessment: No/denies pain    Home Living Family/patient expects to be discharged to:: Private residence Living Arrangements: Alone Available Help at Discharge: Family;Friend(s);Available PRN/intermittently Type of Home: House Home Access: Level entry       Home Layout: One level Home Equipment: Cane - single  point;Grab bars - tub/shower;Grab bars - toilet;Shower [employer] (4 wheels)      Prior Function Prior Level of Function : Independent/Modified Independent;Driving             Mobility Comments: indep for household mobility and will occasionally bring a SPC with him when he ambulates outside of the home in case he needs it ADLs Comments: indep. history major. enjoys to read     Extremity/Trunk Assessment   Upper Extremity Assessment Upper Extremity Assessment: Generalized weakness    Lower Extremity Assessment Lower Extremity Assessment: Generalized weakness    Cervical / Trunk Assessment Cervical / Trunk Assessment: Kyphotic  Communication   Communication Communication: Impaired Factors Affecting Communication: Hearing impaired    Cognition Arousal: Alert Behavior During Therapy: WFL for tasks assessed/performed   PT - Cognitive impairments: No apparent impairments                         Following commands: Intact       Cueing Cueing Techniques: Verbal cues, Gestural cues     General Comments General comments (skin integrity, edema, etc.): HR: 94, SpO2 94% on RA    Exercises     Assessment/Plan    PT Assessment Patient needs continued PT services  PT Problem List Decreased activity tolerance;Decreased balance;Decreased mobility       PT Treatment Interventions DME instruction;Gait training;Functional mobility training;Therapeutic activities;Therapeutic exercise;Balance training;Manual techniques;Modalities    PT Goals (Current goals can be found in the Care Plan section)  Acute Rehab PT Goals Patient Stated Goal: to go home PT Goal Formulation: With patient Time For Goal Achievement: 02/25/24 Potential to Achieve Goals: Good Additional Goals Additional Goal #1: pt will score >19 on DGI demonstrating improved balance and reduced risk of falls    Frequency Min 1X/week     Co-evaluation               AM-PAC  PT 6 Clicks Mobility  Outcome Measure Help needed turning from your back to your side while in a flat bed without using bedrails?: None Help needed moving from lying on your back to sitting on the side of a flat bed without using bedrails?: None Help needed moving to and from a bed to a chair (including a wheelchair)?: A Little Help needed standing up from a chair using your arms (e.g., wheelchair or bedside chair)?: A Little Help needed to walk in hospital room?: A Little Help needed climbing 3-5 steps with a railing? : Total 6 Click Score: 18    End of Session Equipment Utilized During Treatment: Gait belt Activity Tolerance: Patient tolerated treatment well Patient left: in bed;with call bell/phone within reach Nurse Communication: Mobility status PT Visit Diagnosis: Other abnormalities of gait and mobility (R26.89)    Time: 8771-8740 PT Time Calculation (min) (ACUTE ONLY): 31 min   Charges:   PT Evaluation $PT Eval Low Complexity: 1 Low PT Treatments $Therapeutic Activity: 8-22 mins PT General Charges $$ ACUTE PT VISIT: 1 Visit         Leontine Hilt DPT Acute Rehab Services [phone] Prefer contact via chat   Leontine NOVAK Filemon Breton 02/11/2024, 1:07 PM

## 2024-02-11 NOTE — Plan of Care (Signed)
  Problem: Education: Goal: Knowledge of General Education information will improve Description: Including pain rating scale, medication(s)/side effects and non-pharmacologic comfort measures Outcome: Progressing   Problem: Health Behavior/Discharge Planning: Goal: Ability to manage health-related needs will improve Outcome: Progressing   Problem: Activity: Goal: Risk for activity intolerance will decrease Outcome: Progressing   Problem: Nutrition: Goal: Adequate nutrition will be maintained Outcome: Progressing   Problem: Coping: Goal: Level of anxiety will decrease Outcome: Progressing   Problem: Elimination: Goal: Will not experience complications related to bowel motility Outcome: Progressing   Problem: Safety: Goal: Ability to remain free from injury will improve Outcome: Progressing   Problem: Skin Integrity: Goal: Risk for impaired skin integrity will decrease Outcome: Progressing   

## 2024-02-12 DIAGNOSIS — R310 Gross hematuria: Secondary | ICD-10-CM | POA: Diagnosis not present

## 2024-02-12 DIAGNOSIS — D62 Acute posthemorrhagic anemia: Secondary | ICD-10-CM | POA: Diagnosis not present

## 2024-02-12 DIAGNOSIS — I480 Paroxysmal atrial fibrillation: Secondary | ICD-10-CM | POA: Diagnosis not present

## 2024-02-12 LAB — BASIC METABOLIC PANEL WITH GFR
Anion gap: 11 (ref 5–15)
BUN: 54 mg/dL — ABNORMAL HIGH (ref 8–23)
CO2: 18 mmol/L — ABNORMAL LOW (ref 22–32)
Calcium: 8.1 mg/dL — ABNORMAL LOW (ref 8.9–10.3)
Chloride: 110 mmol/L (ref 98–111)
Creatinine, Ser: 3.2 mg/dL — ABNORMAL HIGH (ref 0.61–1.24)
GFR, Estimated: 18 mL/min — ABNORMAL LOW
Glucose, Bld: 101 mg/dL — ABNORMAL HIGH (ref 70–99)
Potassium: 4.1 mmol/L (ref 3.5–5.1)
Sodium: 139 mmol/L (ref 135–145)

## 2024-02-12 LAB — CBC WITH DIFFERENTIAL/PLATELET
Abs Immature Granulocytes: 0.15 K/uL — ABNORMAL HIGH (ref 0.00–0.07)
Basophils Absolute: 0 K/uL (ref 0.0–0.1)
Basophils Relative: 0 %
Eosinophils Absolute: 0.1 K/uL (ref 0.0–0.5)
Eosinophils Relative: 2 %
HCT: 23.7 % — ABNORMAL LOW (ref 39.0–52.0)
Hemoglobin: 7.8 g/dL — ABNORMAL LOW (ref 13.0–17.0)
Immature Granulocytes: 4 %
Lymphocytes Relative: 19 %
Lymphs Abs: 0.8 K/uL (ref 0.7–4.0)
MCH: 33.1 pg (ref 26.0–34.0)
MCHC: 32.9 g/dL (ref 30.0–36.0)
MCV: 100.4 fL — ABNORMAL HIGH (ref 80.0–100.0)
Monocytes Absolute: 0.5 K/uL (ref 0.1–1.0)
Monocytes Relative: 12 %
Neutro Abs: 2.5 K/uL (ref 1.7–7.7)
Neutrophils Relative %: 63 %
Platelets: 78 K/uL — ABNORMAL LOW (ref 150–400)
RBC: 2.36 MIL/uL — ABNORMAL LOW (ref 4.22–5.81)
RDW: 19.1 % — ABNORMAL HIGH (ref 11.5–15.5)
Smear Review: NORMAL
WBC: 4 K/uL (ref 4.0–10.5)
nRBC: 0 % (ref 0.0–0.2)

## 2024-02-12 LAB — HEMOGLOBIN AND HEMATOCRIT, BLOOD
HCT: 24.4 % — ABNORMAL LOW (ref 39.0–52.0)
Hemoglobin: 7.6 g/dL — ABNORMAL LOW (ref 13.0–17.0)

## 2024-02-12 LAB — PREPARE RBC (CROSSMATCH)

## 2024-02-12 MED ORDER — SODIUM BICARBONATE 650 MG PO TABS
1300.0000 mg | ORAL_TABLET | Freq: Two times a day (BID) | ORAL | Status: DC
  Administered 2024-02-12 – 2024-02-23 (×23): 1300 mg via ORAL
  Filled 2024-02-12 (×23): qty 2

## 2024-02-12 MED ORDER — SODIUM CHLORIDE 0.9% IV SOLUTION: Freq: Once | INTRAVENOUS | Status: DC

## 2024-02-12 MED ORDER — ALLOPURINOL 100 MG PO TABS
100.0000 mg | ORAL_TABLET | Freq: Every day | ORAL | Status: DC
  Administered 2024-02-12 – 2024-02-23 (×12): 100 mg via ORAL
  Filled 2024-02-12 (×12): qty 1

## 2024-02-12 MED ORDER — AMLODIPINE BESYLATE 5 MG PO TABS
5.0000 mg | ORAL_TABLET | Freq: Every day | ORAL | Status: DC
  Administered 2024-02-12 – 2024-02-20 (×9): 5 mg via ORAL
  Filled 2024-02-12 (×10): qty 1

## 2024-02-12 MED ORDER — SODIUM CHLORIDE 0.45 % IV SOLN: INTRAVENOUS | Status: DC

## 2024-02-12 NOTE — Progress Notes (Signed)
 Mobility Specialist Progress Note:   02/12/24 1450  Mobility  Activity Ambulated with assistance (In hallway)  Level of Assistance Standby assist, set-up cues, supervision of patient - no hands on  Assistive Device [place];Other (Comment) (IV Pole)  Distance Ambulated (ft) 450 ft  Activity Response Tolerated well  Mobility Referral Yes  Mobility visit 1 Mobility  Mobility Specialist Start Time (ACUTE ONLY) 1416  Mobility Specialist Stop Time (ACUTE ONLY) 1443  Mobility Specialist Time Calculation (min) (ACUTE ONLY) 27 min   Received pt in bed and agreeable to mobility. No physical assistance required. No c/o. Returned to room without fault. Pt requested to use BR. MS assisted with pericare. Pt left in bed with alarm on. Personal belongings and call light within reach. All needs met.  Lavanda Pollack Mobility Specialist  Please contact via [employer] or  Rehab Office [phone]

## 2024-02-12 NOTE — Evaluation (Signed)
 Occupational Therapy Evaluation/Discharge Patient Details Name: Jordan Oconnell MRN: [id number] DOB: August 21, 1940 Today's Date: 02/12/2024   History of Present Illness   Pt is an 84 y.o. M presenting to [facility] on 02/09/24 from cardiology for evaluation of abnormal values on pacemaker transmission. Pt found to be severely anemic. PMH is significant for GERD, HLD, HTN, hypothyroidism, CKD, tachybradycardia syndrome, anemia, AKI, CKD.     Clinical Impressions PTA, pt lives alone and typically Modified Independent with all ADLs, IADLs, driving and mobility with intermittent cane use. Pt presents now reporting strength still not back to baseline but otherwise able to manage ADLs and mobility without physical assistance. Pt with 2/4 DOE but VSS on RA. Reinforced self monitoring when rest breaks needed and pacing self at home. Pt agrees no further skilled OT services needed at this time. Recommend continued work with mobility specialists while admitted.     If plan is discharge home, recommend the following:   Other (comment) (PRN)     Functional Status Assessment   Patient has had a recent decline in their functional status and demonstrates the ability to make significant improvements in function in a reasonable and predictable amount of time.     Equipment Recommendations   None recommended by OT     Recommendations for Other Services         Precautions/Restrictions   Precautions Precautions: Fall Recall of Precautions/Restrictions: Intact Restrictions Weight Bearing Restrictions Per Provider Order: No     Mobility Bed Mobility Overal bed mobility: Modified Independent                  Transfers Overall transfer level: Modified independent Equipment used: Straight cane Transfers: Sit to/from Stand Sit to Stand: Modified independent (Device/Increase time)                  Balance Overall balance assessment: Mild deficits observed, not formally  tested                                         ADL either performed or assessed with clinical judgement   ADL Overall ADL's : At baseline                                       General ADL Comments: pt received in bathroom, able to manage hygiene without issues. some assist with IV pole mgmt out of bathroom to sink for hand hygiene. Pt completed seated rest break before reporting goal to walk down entire unit today. pt able to do so using cane and IV pole in other hand without LOB or issues. 2/4 DOE but VSS on RA     Vision Ability to See in Adequate Light: 0 Adequate Patient Visual Report: No change from baseline Vision Assessment?: No apparent visual deficits     Perception         Praxis         Pertinent Vitals/Pain Pain Assessment Pain Assessment: No/denies pain     Extremity/Trunk Assessment Upper Extremity Assessment Upper Extremity Assessment: Overall WFL for tasks assessed;Right hand dominant   Lower Extremity Assessment Lower Extremity Assessment: Defer to PT evaluation   Cervical / Trunk Assessment Cervical / Trunk Assessment: Kyphotic   Communication Communication Communication: Impaired Factors Affecting Communication: Hearing impaired   Cognition Arousal: Alert  Behavior During Therapy: WFL for tasks assessed/performed Cognition: No apparent impairments                               Following commands: Intact       Cueing  General Comments   Cueing Techniques: Verbal cues;Gestural cues      Exercises     Shoulder Instructions      Home Living Family/patient expects to be discharged to:: Private residence Living Arrangements: Alone Available Help at Discharge: Family;Friend(s);Available PRN/intermittently Type of Home: House Home Access: Level entry     Home Layout: One level     Bathroom Shower/Tub: Tub/shower unit;Walk-in shower   Bathroom Toilet: Handicapped height Bathroom  Accessibility: Yes   Home Equipment: Cane - single point;Grab bars - tub/shower;Grab bars - toilet;Shower Quarry Manager (4 wheels)          Prior Functioning/Environment Prior Level of Function : Independent/Modified Independent;Driving             Mobility Comments: indep for household mobility and will occasionally bring a SPC with him when he ambulates outside of the home in case he needs it ADLs Comments: Indep, likes to read    OT Problem List:     OT Treatment/Interventions:        OT Goals(Current goals can be found in the care plan section)   Acute Rehab OT Goals Patient Stated Goal: get my strength back OT Goal Formulation: All assessment and education complete, DC therapy   OT Frequency:       Co-evaluation              AM-PAC OT 6 Clicks Daily Activity     Outcome Measure Help from another person eating meals?: None Help from another person taking care of personal grooming?: None Help from another person toileting, which includes using toliet, bedpan, or urinal?: None Help from another person bathing (including washing, rinsing, drying)?: None Help from another person to put on and taking off regular upper body clothing?: None Help from another person to put on and taking off regular lower body clothing?: None 6 Click Score: 24   End of Session Equipment Utilized During Treatment: Gait belt;Other (comment) (cane) Nurse Communication: Mobility status  Activity Tolerance: Patient tolerated treatment well Patient left: in bed;with bed alarm set;with call bell/phone within reach  OT Visit Diagnosis: Muscle weakness (generalized) (M62.81)                Time: 9092-9076 OT Time Calculation (min): 16 min Charges:  OT General Charges $OT Visit: 1 Visit OT Evaluation $OT Eval Low Complexity: 1 Low  Mliss NOVAK, OTR/L Acute Rehab Services Office: [phone]   Mliss Fish 02/12/2024, 9:32 AM

## 2024-02-12 NOTE — Progress Notes (Signed)
 "  [facility] PROGRESS NOTE   Jordan Oconnell FMW:[id number] DOB: 09/30/1940 DOA: 02/09/2024  PCP: Shepard Ade, MD  Brief History: 84 y.o. male with medical history significant of hypertension, hyperlipidemia, GERD, hypothyroidism, CKD 4, solitary kidney, atrial fibrillation, tachybradycardia syndrome status post pacemaker, aortic stenosis status post TAVR, chronic diastolic CHF, depression, anemia, bladder cancer status post cystectomy and urostomy, left total nephrectomy, right partial nephrectomy, presenting with anemia and AKI versus progressive CKD as direct admission. Patient sent as a direct admission by cardiology after labs were obtained to further evaluate abnormal values on pacemaker transmission.  He did mention fatigue ongoing for 1 to 2 weeks.  He was noted to be profoundly anemic.  He was hospitalized for further management.  Consultants: Cardiology.  Urology  Procedures: None yet   Subjective/Interval History: Patient mentioned that he feels well.  Denies any abdominal pain nausea vomiting.  No fever or chills.    Assessment/Plan:  Acute blood loss anemia Anemia most likely due to gross hematuria.  This is in the setting of Eliquis . Patient presented with hemoglobin of 5.5.  Has been transfused 3 units of PRBC so far.   Hemoglobin did improve.  Noted to be slightly lower today at 7.8.  Will recheck labs later today.    Gross hematuria/history of bladder cancer/urothelial cancer/RCC This is in the setting of a complicated urological history with history of bladder cancer, renal cell cancer.  He is status post cystectomy and has a urostomy.  He also is status post partial right nephrectomy and total left nephrectomy. Has had gross hematuria the last several days. Eliquis  has been held.  He was also on aspirin  which has been held as well. Patient is followed at [facility] for urologic issues but wants to transfer care locally if possible.  So urology was  consulted. CT scan of the abdomen pelvis raises concern for hepatic metastases.  Also showed new retroperitoneal and right inguinal lymphadenopathy also concerning for metastatic disease.  Found to have mass in the right pelvic collecting system but this was seen on previous CT scan as well. Will need to be seen by medical oncology eventually.  No indication to involve them currently. Urology is following.  TXA is being considered if bleeding does not subside. Hematuria persists.  Hemoglobin noted to be slightly lower today compared to yesterday.  Will recheck this afternoon and tomorrow morning. On prophylactic antibiotics with ceftriaxone .    Acute kidney injury on chronic kidney disease stage IV Patient with solitary kidney. Baseline creatinine seems to be between 2.5-3.0.  Higher creatinine noted.  Most likely due to volume loss.   Creatinine is gradually improving with IV fluids.  Continue IV fluids for another day and then discontinue.  Monitor urine output.     Essential hypertension Noted to be on carvedilol , nitrates.  Looks like his amlodipine  and hydralazine  is on hold.  Occasional high readings noted.  Resume his amlodipine  for now.   Paroxysmal atrial fibrillation/atrial flutter with tachybrady/pacemaker in situ Followed by cardiology who are seeing him in the hospital. Noted to be on amiodarone . Eliquis  on hold due to anemia and gross hematuria.  Likely need to hold for the foreseeable future.  Patient understands the risk that this poses in terms of increased risk for stroke.  History of severe aortic stenosis status post TAVR Noted to be stable.  Chronic diastolic CHF/coronary artery disease Recent echo showed normal LVEF. Has had episodes of NSVT which has been asymptomatic.  Continue to monitor  on telemetry.  Continue to monitor electrolytes. Volume status seems to be improving. Continue carvedilol   Hypothyroidism Continue  levothyroxine .  Cholelithiasis Incidentally noted on CT scan.  Seems to be asymptomatic.  Abdominal wall hernia Asymptomatic.  DVT Prophylaxis: SCDs Code Status: Full code Family Communication: Discussed with patient Disposition Plan: Lives by himself.  PT and OT eval. home when improved.    Medications: Scheduled:  ALPRAZolam   0.25 mg Oral QHS   amiodarone   200 mg Oral Daily   atorvastatin   10 mg Oral QHS   carvedilol   12.5 mg Oral BID PC   feeding supplement  237 mL Oral BID BM   isosorbide  mononitrate  30 mg Oral Daily   levothyroxine   200 mcg Oral QAC breakfast   mirtazapine   7.5 mg Oral QHS   sodium chloride  flush  3 mL Intravenous Q12H   Continuous:  sodium chloride  Stopped (02/12/24 1031)   cefTRIAXone  (ROCEPHIN )  IV 1 g (02/11/24 1109)    PRN:acetaminophen  **OR** acetaminophen , polyethylene glycol  Objective:  Vital Signs  Vitals:   02/12/24 0042 02/12/24 0446 02/12/24 0743 02/12/24 0854  BP: (!) 149/72 (!) 154/73 (!) 157/78 (!) 157/78  Pulse: 75 70 84 83  Resp: 18 18 17    Temp: 97.8 F (36.6 C) 97.8 F (36.6 C) 97.9 F (36.6 C)   TempSrc: Oral Oral Oral   SpO2: 94% 98% 93%   Weight:      Height:        Intake/Output Summary (Last 24 hours) at 02/12/2024 1040 Last data filed at 02/12/2024 0731 Gross per 24 hour  Intake 840 ml  Output 3075 ml  Net -2235 ml   Filed Weights   02/10/24 0730  Weight: 84.1 kg    General appearance: Awake alert.  In no distress Resp: Clear to auscultation bilaterally.  Normal effort Cardio: S1-S2 is normal regular.  No S3-S4.  No rubs murmurs or bruit GI: Abdomen is soft.  Nontender nondistended.  Bowel sounds are present normal.  No masses organomegaly Urostomy is noted with reddish urine output   Lab Results:  Data Reviewed: I have personally reviewed following labs and reports of the imaging studies  CBC: Recent Labs  Lab 02/08/24 1201 02/09/24 1833 02/09/24 1833 02/10/24 0154 02/10/24 0705  02/10/24 1217 02/10/24 1818 02/11/24 0421 02/11/24 1113 02/12/24 0323  WBC 4.4 3.8*  --  3.7*  --   --   --  4.0  --  4.0  NEUTROABS  --   --   --   --   --   --   --  2.5  --  2.5  HGB 6.9* 5.5*   < > 6.2*   < > 7.2* 8.8* 8.5* 8.7* 7.8*  HCT 22.8* 18.8*   < > 20.1*   < > 23.4* 27.9* 26.0* 27.5* 23.7*  MCV 108* 113.3*  --  104.7*  --   --   --  99.2  --  100.4*  PLT 132* 95*  --  89*  --   --   --  86*  --  78*   < > = values in this interval not displayed.    Basic Metabolic Panel: Recent Labs  Lab 02/08/24 1201 02/09/24 1833 02/10/24 0154 02/11/24 0421 02/11/24 0730 02/12/24 0323  NA 142 137 137 140  --  139  K 4.8 4.6 4.5 4.0  --  4.1  CL 107* 105 107 110  --  110  CO2 15* 19* 19* 19*  --  18*  GLUCOSE 136* 207* 87 97  --  101*  BUN 59* 64* 63* 56*  --  54*  CREATININE 3.79* 4.28* 4.10* 3.66*  --  3.20*  CALCIUM  8.9 8.5* 8.1* 8.2*  --  8.1*  MG  --  2.3  --   --  2.1  --     GFR: Estimated Creatinine Clearance: 19.4 mL/min (A) (by C-G formula based on SCr of 3.2 mg/dL (H)).  Liver Function Tests: Recent Labs  Lab 02/09/24 1833 02/10/24 0154  AST 17 16  ALT 12 9  ALKPHOS 43 38  BILITOT 0.3 0.5  PROT 5.6* 4.9*  ALBUMIN  3.6 3.1*   Coagulation Profile: Recent Labs  Lab 02/09/24 1833  INR 1.2   Anemia Panel: Recent Labs    02/09/24 1833  VITAMINB12 2,174*  FOLATE 13.8  FERRITIN 67  TIBC 301  IRON 27*  RETICCTPCT 8.9*     Radiology Studies: No results found.      LOS: 2 days   Sherrilyn Nairn Verdene  [facility] Pager on [email or web address]  02/12/2024, 10:40 AM   "

## 2024-02-12 NOTE — Progress Notes (Signed)
 "   Subjective: Jordan Oconnell is doing well today without complaints.  His urine remains bloody and his Hgb has fallen to 7.8 from 8.7.  His Cr is down to 3.2.  He has no associated signs or symptoms.  ROS:  Review of Systems  All other systems reviewed and are negative.   Anti-infectives: Anti-infectives (From admission, onward)    Start     Dose/Rate Route Frequency Ordered Stop   02/11/24 1130  cefTRIAXone  (ROCEPHIN ) 1 g in sodium chloride  0.9 % 100 mL IVPB        1 g 200 mL/hr over 30 Minutes Intravenous Every 24 hours 02/11/24 1031 02/16/24 1129       Current Facility-Administered Medications  Medication Dose Route Frequency Provider Last Rate Last Admin   0.45 % sodium chloride  infusion   Intravenous Continuous Krishnan, Gokul, MD 75 mL/hr at 02/12/24 0042 New Bag at 02/12/24 0042   acetaminophen  (TYLENOL ) tablet 650 mg  650 mg Oral Q6H PRN Seena Marsa NOVAK, MD       Or   acetaminophen  (TYLENOL ) suppository 650 mg  650 mg Rectal Q6H PRN Seena Marsa NOVAK, MD       ALPRAZolam  (XANAX ) tablet 0.25 mg  0.25 mg Oral QHS Melvin, Alexander B, MD   0.25 mg at 02/11/24 1949   amiodarone  (PACERONE ) tablet 200 mg  200 mg Oral Daily Melvin, Alexander B, MD   200 mg at 02/11/24 9193   atorvastatin  (LIPITOR) tablet 10 mg  10 mg Oral QHS Melvin, Alexander B, MD   10 mg at 02/11/24 1949   carvedilol  (COREG ) tablet 12.5 mg  12.5 mg Oral BID PC Melvin, Alexander B, MD   12.5 mg at 02/11/24 1649   cefTRIAXone  (ROCEPHIN ) 1 g in sodium chloride  0.9 % 100 mL IVPB  1 g Intravenous Q24H Krishnan, Gokul, MD 200 mL/hr at 02/11/24 1109 1 g at 02/11/24 1109   feeding supplement (ENSURE ENLIVE / ENSURE PLUS) liquid 237 mL  237 mL Oral BID BM Melvin, Alexander B, MD       isosorbide  mononitrate (IMDUR ) 24 hr tablet 30 mg  30 mg Oral Daily Melvin, Alexander B, MD   30 mg at 02/11/24 9193   levothyroxine  (SYNTHROID ) tablet 200 mcg  200 mcg Oral QAC breakfast Kakrakandy, Arshad N, MD   200 mcg at 02/12/24 9380    mirtazapine  (REMERON ) tablet 7.5 mg  7.5 mg Oral QHS Seena Marsa NOVAK, MD   7.5 mg at 02/11/24 1949   polyethylene glycol (MIRALAX  / GLYCOLAX ) packet 17 g  17 g Oral Daily PRN Seena Marsa NOVAK, MD       sodium chloride  flush (NS) 0.9 % injection 3 mL  3 mL Intravenous Q12H Seena Marsa NOVAK, MD   3 mL at 02/11/24 9192     Objective: Vital signs in last 24 hours: Temp:  [97.8 F (36.6 C)-98.6 F (37 C)] 97.9 F (36.6 C) (01/18 0743) Pulse Rate:  [70-84] 84 (01/18 0743) Resp:  [16-18] 17 (01/18 0743) BP: (135-157)/(69-78) 157/78 (01/18 0743) SpO2:  [93 %-98 %] 93 % (01/18 0743)  Intake/Output from previous day: 01/17 0701 - 01/18 0700 In: 840 [P.O.:840] Out: 1075 [Urine:1075] Intake/Output this shift: Total I/O In: -  Out: 2000 [Urine:2000]   Physical Exam Vitals reviewed.  Constitutional:      Appearance: Normal appearance.  Genitourinary:    Comments: Urine in tubing is bloody without clots.  Neurological:     Mental Status: He is alert.  Lab Results:  Recent Labs    02/11/24 0421 02/11/24 1113 02/12/24 0323  WBC 4.0  --  4.0  HGB 8.5* 8.7* 7.8*  HCT 26.0* 27.5* 23.7*  PLT 86*  --  78*   BMET Recent Labs    02/11/24 0421 02/12/24 0323  NA 140 139  K 4.0 4.1  CL 110 110  CO2 19* 18*  GLUCOSE 97 101*  BUN 56* 54*  CREATININE 3.66* 3.20*  CALCIUM  8.2* 8.1*   PT/INR Recent Labs    02/09/24 1833  LABPROT 15.7*  INR 1.2   ABG No results for input(s): PHART, HCO3 in the last 72 hours.  Invalid input(s): PCO2, PO2  Studies/Results: No results found.  CBC and BMP reviewed.  I discussed his case with Dr. Verdene.    Assessment and Plan: Urothelial cancer in a solitary right kidney with persistent hematuria and acute blood loss anemia.   He still has some hematuria.  He is 3 days off of Eliquis  and ASA and has a persistent thrombocytopenia.  His Hgb has declined from 8.7 to 7.8 over the last 24 hours following a 3U  transfusion.  If the hematuria doesn't abate, it might be worthwhile to consider IV or oral TXA as our options are limited.    Acute on chronic kidney disease.  His Cr has declined to 3.2.         LOS: 2 days    Jordan Oconnell 02/12/2024 "

## 2024-02-13 DIAGNOSIS — I495 Sick sinus syndrome: Secondary | ICD-10-CM | POA: Diagnosis not present

## 2024-02-13 DIAGNOSIS — D62 Acute posthemorrhagic anemia: Secondary | ICD-10-CM | POA: Diagnosis not present

## 2024-02-13 DIAGNOSIS — I480 Paroxysmal atrial fibrillation: Secondary | ICD-10-CM | POA: Diagnosis not present

## 2024-02-13 DIAGNOSIS — C671 Malignant neoplasm of dome of bladder: Secondary | ICD-10-CM | POA: Diagnosis not present

## 2024-02-13 LAB — BPAM RBC
Blood Product Expiration Date: 202602032359
Blood Product Expiration Date: 202602092359
Blood Product Expiration Date: 202602122359
Blood Product Expiration Date: 202602142359
ISSUE DATE / TIME: 202601152038
ISSUE DATE / TIME: 202601160255
ISSUE DATE / TIME: 202601161330
ISSUE DATE / TIME: 202601181805
Unit Type and Rh: 5100
Unit Type and Rh: 5100
Unit Type and Rh: 5100
Unit Type and Rh: 5100

## 2024-02-13 LAB — CBC WITH DIFFERENTIAL/PLATELET
Abs Immature Granulocytes: 0.15 K/uL — ABNORMAL HIGH (ref 0.00–0.07)
Basophils Absolute: 0 K/uL (ref 0.0–0.1)
Basophils Relative: 0 %
Eosinophils Absolute: 0 K/uL (ref 0.0–0.5)
Eosinophils Relative: 1 %
HCT: 28.1 % — ABNORMAL LOW (ref 39.0–52.0)
Hemoglobin: 9 g/dL — ABNORMAL LOW (ref 13.0–17.0)
Immature Granulocytes: 4 %
Lymphocytes Relative: 15 %
Lymphs Abs: 0.6 K/uL — ABNORMAL LOW (ref 0.7–4.0)
MCH: 31 pg (ref 26.0–34.0)
MCHC: 32 g/dL (ref 30.0–36.0)
MCV: 96.9 fL (ref 80.0–100.0)
Monocytes Absolute: 0.5 K/uL (ref 0.1–1.0)
Monocytes Relative: 12 %
Neutro Abs: 2.8 K/uL (ref 1.7–7.7)
Neutrophils Relative %: 68 %
Platelets: 80 K/uL — ABNORMAL LOW (ref 150–400)
RBC: 2.9 MIL/uL — ABNORMAL LOW (ref 4.22–5.81)
RDW: 20.8 % — ABNORMAL HIGH (ref 11.5–15.5)
WBC: 4.1 K/uL (ref 4.0–10.5)
nRBC: 0 % (ref 0.0–0.2)

## 2024-02-13 LAB — HEMOGLOBIN AND HEMATOCRIT, BLOOD
HCT: 28.7 % — ABNORMAL LOW (ref 39.0–52.0)
Hemoglobin: 8.9 g/dL — ABNORMAL LOW (ref 13.0–17.0)

## 2024-02-13 LAB — BASIC METABOLIC PANEL WITH GFR
Anion gap: 11 (ref 5–15)
BUN: 52 mg/dL — ABNORMAL HIGH (ref 8–23)
CO2: 18 mmol/L — ABNORMAL LOW (ref 22–32)
Calcium: 8.7 mg/dL — ABNORMAL LOW (ref 8.9–10.3)
Chloride: 111 mmol/L (ref 98–111)
Creatinine, Ser: 3.04 mg/dL — ABNORMAL HIGH (ref 0.61–1.24)
GFR, Estimated: 20 mL/min — ABNORMAL LOW
Glucose, Bld: 96 mg/dL (ref 70–99)
Potassium: 4.7 mmol/L (ref 3.5–5.1)
Sodium: 139 mmol/L (ref 135–145)

## 2024-02-13 LAB — TYPE AND SCREEN
ABO/RH(D): O POS
Antibody Screen: NEGATIVE
Unit division: 0
Unit division: 0
Unit division: 0
Unit division: 0

## 2024-02-13 NOTE — Progress Notes (Signed)
 Mobility Specialist Progress Note;   02/13/24 1036  Mobility  Activity Ambulated with assistance  Level of Assistance Contact guard assist, steadying assist  Assistive Device Cane  Distance Ambulated (ft) 450 ft  Activity Response Tolerated well  Mobility Referral Yes  Mobility visit 1 Mobility  Mobility Specialist Start Time (ACUTE ONLY) 1036  Mobility Specialist Stop Time (ACUTE ONLY) 1046  Mobility Specialist Time Calculation (min) (ACUTE ONLY) 10 min   Pt pleasant and eager for mobility. Required light MinG assistance during ambulation for safety. Able to ambulate entire hallway w/o fault. Some audible dyspnea however SPO2 WFL and no c/o when asked. Pt deferred sitting in chair at [facility]. Returned pt back to bed and left with all needs met, alarm on.   Lauraine Erm Mobility Specialist Please contact via SecureChat or [employer] [phone]

## 2024-02-13 NOTE — TOC Initial Note (Signed)
 Transition of Care [facility]) - Initial/Assessment Note    Patient Details  Name: Jordan Oconnell MRN: [id number] Date of Birth: [birth year]  [facility] CM/SW Contact:    Andrez JULIANNA George, RN Phone Number: 02/13/2024, 11:03 AM  Clinical Narrative:                 Jordan Oconnell is a 84 y.o. male with medical history significant of hypertension, hyperlipidemia, GERD, hypothyroidism, CKD 4, solitary kidney, atrial fibrillation, tachybradycardia syndrome status post pacemaker, aortic stenosis status post TAVR, chronic diastolic CHF, depression, anemia, bladder cancer status post urostomy presenting with anemia and AKI versus progressive CKD.  Pt is from home alone. He has neighbors that will check on him and a girlfriend that will check on him. Pt drives self and denies any issues with home medications.  Pt states he has all needed supplies at home for his ostomy.   No follow up per therapies.  Pt states his girlfriend will provide transportation home at d/c.   Expected Discharge Plan: Home/Self Care Barriers to Discharge: Continued Medical Work up   Patient Goals and CMS Choice            Expected Discharge Plan and Services   Discharge Planning Services: CM Consult   Living arrangements for the past 2 months: Single Family Home                                      Prior Living Arrangements/Services Living arrangements for the past 2 months: Single Family Home Lives with:: Self Patient language and need for interpreter reviewed:: Yes Do you feel safe going back to the place where you live?: Yes          Current home services: DME (rollator/ shower seat/ cane/ walker/ ostomy supplies) Criminal Activity/Legal Involvement Pertinent to Current Situation/Hospitalization: No - Comment as needed  Activities of Daily Living   ADL Screening (condition at time of admission) Independently performs ADLs?: Yes (appropriate for developmental age) Is the  patient deaf or have difficulty hearing?: Yes Does the patient have difficulty seeing, even when wearing glasses/contacts?: No Does the patient have difficulty concentrating, remembering, or making decisions?: No  Permission Sought/Granted                  Emotional Assessment Appearance:: Appears stated age Attitude/Demeanor/Rapport: Engaged Affect (typically observed): Accepting Orientation: : Oriented to Self, Oriented to Place, Oriented to  Time, Oriented to Situation   Psych Involvement: No (comment)  Admission diagnosis:  Blood loss anemia [D50.0] Acute blood loss anemia [D62] Patient Active Problem List   Diagnosis Date Noted   Peripheral artery disease 02/09/2024   Acute blood loss anemia 02/09/2024   Heart block AV complete (HCC) 02/09/2024   Coronary artery disease involving native coronary artery of native heart without angina pectoris 02/09/2024   Hypothyroidism due to amiodarone  02/09/2024   Osteomyelitis of fourth toe of left foot (HCC) 05/03/2023   Osteomyelitis of left foot (HCC) 04/26/2023   Toe pain, left 04/15/2023   Non-ST elevation (NSTEMI) myocardial infarction (HCC) 03/08/2023   Osteomyelitis of fourth toe of right foot (HCC) 03/01/2023   Chronic combined systolic and diastolic CHF (congestive heart failure) (HCC) 03/01/2023   Hypothyroidism 03/01/2023   Tachycardia-bradycardia syndrome (HCC) 03/11/2022   Atypical atrial flutter (HCC) 03/03/2022   Pacemaker 02/12/2021   LBBB (left bundle branch block) 01/29/2021   1st  degree AV block 01/29/2021   S/P TAVR (transcatheter aortic valve replacement) 01/27/2021   Severe aortic stenosis 10/05/2020   CKD (chronic kidney disease) stage 4, GFR 15-29 ml/min (HCC) 02/01/2017   Presence of urostomy (HCC) 09/22/2016   Pneumonia 07/26/2013    Class: Acute   Intractable hiccups 07/22/2013    Class: Chronic   Anemia 07/22/2013    Class: Acute   Protein-calorie malnutrition, severe (HCC) 07/19/2013   Syncope  and collapse 07/17/2013   Bladder cancer (HCC) 07/17/2013   Major depression, single episode 06/06/2013   Urothelial cancer (HCC) 09/03/2012   Essential hypertension 09/03/2012   PAF (paroxysmal atrial fibrillation) (HCC) 09/03/2012   Hyperlipidemia 01/29/2009   Gastro-esophageal reflux disease without esophagitis 01/16/2009   PCP:  Shepard Ade, MD Pharmacy:   [facility] [address] [place] - [address] [place] [address] Phone: [phone] Fax: [phone]     Social Drivers of Health (SDOH) Social History: SDOH Screenings   Food Insecurity: No Food Insecurity (02/09/2024)  Housing: Low Risk (02/09/2024)  Transportation Needs: No Transportation Needs (02/09/2024)  Utilities: Not At Risk (02/09/2024)  Social Connections: Unknown (02/09/2024)  Tobacco Use: Low Risk (02/10/2024)   SDOH Interventions:     Readmission Risk Interventions    05/04/2023   10:12 AM 03/08/2023   12:36 PM  Readmission Risk Prevention Plan  Transportation Screening Complete Complete  PCP or Specialist Appt within 3-5 Days Complete Complete  HRI or Home Care Consult Complete Complete  Social Work Consult for Recovery Care Planning/Counseling Complete Complete  Palliative Care Screening Not Applicable Not Applicable  Medication Review Oceanographer) Complete Complete

## 2024-02-13 NOTE — Plan of Care (Signed)

## 2024-02-13 NOTE — Plan of Care (Signed)
   Problem: Education: Goal: Knowledge of General Education information will improve Description Including pain rating scale, medication(s)/side effects and non-pharmacologic comfort measures Outcome: Progressing

## 2024-02-13 NOTE — Progress Notes (Signed)
" ° °  °  Subjective: No acute events overnight.  Patient resting comfortably in bed, in good spirits as usual.  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  Clot accumulation has improved significantly.  Ongoing bleeding with clear light red urine.  Trend H&H.  Transfuse < 7.0. S/p 1uPRBC  Limited options for treatment.  Will continue holding Eliquis  if medically reasonable and treat empirically for hemorrhagic cystitis.  Nearly impossible to differentiate chronic colonization from hemorrhagic cystitis and a longstanding ileal conduit, but we will need to take this off the diagnostic table.  Failing conservative treatment will consider workup for upper tract bleeding.  Factoring that he has one partially functioning kidney and a significant baseline kidney disease, we will avoid contrasted studies unless absolutely necessary.  Intake/Output from previous day: 01/18 0701 - 01/19 0700 In: 794 [P.O.:540; Blood:254] Out: 4035 [Urine:4035]  Intake/Output this shift: Total I/O In: 240 [P.O.:240] Out: 850 [Urine:850]  Physical Exam:  General: Alert and oriented CV: No cyanosis Lungs: equal chest rise Gu: RLQ ileal conduit/urostomy draining clear red blood-tinged urine with minimal clot accumulation.  Lab Results: Recent Labs    02/12/24 0323 02/12/24 1654 02/13/24 0602  HGB 7.8* 7.6* 9.0*  HCT 23.7* 24.4* 28.1*   BMET Recent Labs    02/12/24 0323 02/12/24 1654 02/13/24 0602  NA 139  --  139  K 4.1  --  4.7  CL 110  --  111  CO2 18*  --  18*  GLUCOSE 101*  --  96  BUN 54*  --  52*   CREATININE 3.20*  --  3.04*  CALCIUM  8.1*  --  8.7*  HGB 7.8* 7.6* 9.0*  WBC 4.0  --  4.1     Studies/Results: No results found.    LOS: 3 days   Ole Bourdon, NP [facility] Pager: [phone]  02/13/2024, 12:26 PM   "

## 2024-02-13 NOTE — Progress Notes (Signed)
 "  [facility] PROGRESS NOTE   Jordan Oconnell FMW:[id number] DOB: 1940-05-03 DOA: 02/09/2024  PCP: Shepard Ade, MD  Brief History: 84 y.o. male with medical history significant of hypertension, hyperlipidemia, GERD, hypothyroidism, CKD 4, solitary kidney, atrial fibrillation, tachybradycardia syndrome status post pacemaker, aortic stenosis status post TAVR, chronic diastolic CHF, depression, anemia, bladder cancer status post cystectomy and urostomy, left total nephrectomy, right partial nephrectomy, presenting with anemia and AKI versus progressive CKD as direct admission. Patient sent as a direct admission by cardiology after labs were obtained to further evaluate abnormal values on pacemaker transmission.  He did mention fatigue ongoing for 1 to 2 weeks.  He was noted to be profoundly anemic.  He was hospitalized for further management.  Consultants: Cardiology.  Urology  Procedures: None yet   Subjective/Interval History: Patient mentioned that he feels well.  Denies any abdominal pain nausea vomiting.  No shortness of breath.  Assessment/Plan:  Acute blood loss anemia Anemia most likely due to gross hematuria.  This is in the setting of Eliquis . Patient presented with hemoglobin of 5.5.  Has been transfused 4 units of PRBC so far.   Hemoglobin noted to be better today after transfusion yesterday.  Continue to monitor twice daily for now.  Gross hematuria/history of bladder cancer/urothelial cancer/RCC This is in the setting of a complicated urological history with history of bladder cancer, renal cell cancer.  He is status post cystectomy and has a urostomy.  He also is status post partial right nephrectomy and total left nephrectomy. Has had gross hematuria the last several days. Eliquis  has been held.  He was also on aspirin  which has been held as well. Patient is followed at [facility] for urologic issues but wants to transfer care locally if possible.  So urology was  consulted. CT scan of the abdomen pelvis raises concern for hepatic metastases.  Also showed new retroperitoneal and right inguinal lymphadenopathy also concerning for metastatic disease.  Found to have mass in the right pelvic collecting system but this was seen on previous CT scan as well. Will need to be seen by medical oncology eventually.  No indication to involve them currently. Urology is following.  TXA is being considered if bleeding does not subside.  This is a difficult situation.  Patient understands. Continues to have hematuria.  Hopefully it will slow down soon. On prophylactic antibiotics with ceftriaxone .    Acute kidney injury on chronic kidney disease stage IV Patient with solitary kidney. Baseline creatinine seems to be between 2.5-3.0.  Higher creatinine noted.  Most likely due to volume loss.   Given IV fluids with improvement in renal function.  Now seems to be back to his baseline.  Okay to discontinue IV fluids.  Essential hypertension Continue amlodipine  carvedilol  and nitrates.  Avoid hypotension.  Paroxysmal atrial fibrillation/atrial flutter with tachybrady/pacemaker in situ Followed by cardiology who are seeing him in the hospital. Noted to be on amiodarone . Eliquis  on hold due to anemia and gross hematuria.  Likely need to hold for the foreseeable future.  Patient understands the risk that this poses in terms of increased risk for stroke.  History of severe aortic stenosis status post TAVR Noted to be stable.  Chronic diastolic CHF/coronary artery disease Recent echo showed normal LVEF. Has had episodes of NSVT which has been asymptomatic.  Continue to monitor on telemetry.  Continue to monitor electrolytes. Volume status seems to be improving. Continue carvedilol   Hypothyroidism Continue levothyroxine .  Cholelithiasis Incidentally noted on CT scan.  Seems to be asymptomatic.  Abdominal wall hernia Asymptomatic.  DVT Prophylaxis: SCDs Code Status:  Full code Family Communication: Discussed with patient Disposition Plan: Lives by himself.  PT and OT eval. home when improved.    Medications: Scheduled:  sodium chloride    Intravenous Once   allopurinol   100 mg Oral Daily   ALPRAZolam   0.25 mg Oral QHS   amiodarone   200 mg Oral Daily   amLODipine   5 mg Oral Daily   atorvastatin   10 mg Oral QHS   carvedilol   12.5 mg Oral BID PC   feeding supplement  237 mL Oral BID BM   isosorbide  mononitrate  30 mg Oral Daily   levothyroxine   200 mcg Oral QAC breakfast   mirtazapine   7.5 mg Oral QHS   sodium bicarbonate   1,300 mg Oral BID   sodium chloride  flush  3 mL Intravenous Q12H   Continuous:  cefTRIAXone  (ROCEPHIN )  IV 1 g (02/12/24 1204)    PRN:acetaminophen  **OR** acetaminophen , polyethylene glycol  Objective:  Vital Signs  Vitals:   02/12/24 2017 02/12/24 2323 02/13/24 0420 02/13/24 0835  BP: (!) 143/68 (!) 155/77 (!) 148/73   Pulse: 78 77 77   Resp: 19 18 16 18   Temp: 97.6 F (36.4 C) 98.1 F (36.7 C) 98 F (36.7 C) 98.5 F (36.9 C)  TempSrc: Oral Oral Oral Oral  SpO2: 95% 91% 93%   Weight:      Height:        Intake/Output Summary (Last 24 hours) at 02/13/2024 0956 Last data filed at 02/13/2024 0427 Gross per 24 hour  Intake 794 ml  Output 2035 ml  Net -1241 ml   Filed Weights   02/10/24 0730  Weight: 84.1 kg    General appearance: Awake alert.  In no distress Resp: Clear to auscultation bilaterally.  Normal effort Cardio: S1-S2 is normal regular.  No S3-S4.  No rubs murmurs or bruit GI: Abdomen is soft.  Nontender nondistended.  Bowel sounds are present normal.  No masses organomegaly.  Urostomy is noted.  Bloody urine is present.   Lab Results:  Data Reviewed: I have personally reviewed following labs and reports of the imaging studies  CBC: Recent Labs  Lab 02/09/24 1833 02/10/24 0154 02/10/24 0705 02/11/24 0421 02/11/24 1113 02/12/24 0323 02/12/24 1654 02/13/24 0602  WBC 3.8* 3.7*  --   4.0  --  4.0  --  4.1  NEUTROABS  --   --   --  2.5  --  2.5  --  2.8  HGB 5.5* 6.2*   < > 8.5* 8.7* 7.8* 7.6* 9.0*  HCT 18.8* 20.1*   < > 26.0* 27.5* 23.7* 24.4* 28.1*  MCV 113.3* 104.7*  --  99.2  --  100.4*  --  96.9  PLT 95* 89*  --  86*  --  78*  --  80*   < > = values in this interval not displayed.    Basic Metabolic Panel: Recent Labs  Lab 02/09/24 1833 02/10/24 0154 02/11/24 0421 02/11/24 0730 02/12/24 0323 02/13/24 0602  NA 137 137 140  --  139 139  K 4.6 4.5 4.0  --  4.1 4.7  CL 105 107 110  --  110 111  CO2 19* 19* 19*  --  18* 18*  GLUCOSE 207* 87 97  --  101* 96  BUN 64* 63* 56*  --  54* 52*  CREATININE 4.28* 4.10* 3.66*  --  3.20* 3.04*  CALCIUM  8.5* 8.1* 8.2*  --  8.1* 8.7*  MG 2.3  --   --  2.1  --   --     GFR: Estimated Creatinine Clearance: 20.4 mL/min (A) (by C-G formula based on SCr of 3.04 mg/dL (H)).  Liver Function Tests: Recent Labs  Lab 02/09/24 1833 02/10/24 0154  AST 17 16  ALT 12 9  ALKPHOS 43 38  BILITOT 0.3 0.5  PROT 5.6* 4.9*  ALBUMIN  3.6 3.1*   Coagulation Profile: Recent Labs  Lab 02/09/24 1833  INR 1.2    Radiology Studies: No results found.      LOS: 3 days   Nader Boys [employer] on [email or web address]  02/13/2024, 9:56 AM   "

## 2024-02-14 LAB — BASIC METABOLIC PANEL WITH GFR
Anion gap: 10 (ref 5–15)
BUN: 53 mg/dL — ABNORMAL HIGH (ref 8–23)
CO2: 19 mmol/L — ABNORMAL LOW (ref 22–32)
Calcium: 8.8 mg/dL — ABNORMAL LOW (ref 8.9–10.3)
Chloride: 112 mmol/L — ABNORMAL HIGH (ref 98–111)
Creatinine, Ser: 2.75 mg/dL — ABNORMAL HIGH (ref 0.61–1.24)
GFR, Estimated: 22 mL/min — ABNORMAL LOW
Glucose, Bld: 91 mg/dL (ref 70–99)
Potassium: 4.6 mmol/L (ref 3.5–5.1)
Sodium: 141 mmol/L (ref 135–145)

## 2024-02-14 LAB — HEMOGLOBIN AND HEMATOCRIT, BLOOD
HCT: 29.2 % — ABNORMAL LOW (ref 39.0–52.0)
Hemoglobin: 9.1 g/dL — ABNORMAL LOW (ref 13.0–17.0)

## 2024-02-14 LAB — CBC WITH DIFFERENTIAL/PLATELET
Abs Immature Granulocytes: 0.18 K/uL — ABNORMAL HIGH (ref 0.00–0.07)
Basophils Absolute: 0 K/uL (ref 0.0–0.1)
Basophils Relative: 0 %
Eosinophils Absolute: 0.1 K/uL (ref 0.0–0.5)
Eosinophils Relative: 2 %
HCT: 27.6 % — ABNORMAL LOW (ref 39.0–52.0)
Hemoglobin: 8.8 g/dL — ABNORMAL LOW (ref 13.0–17.0)
Immature Granulocytes: 5 %
Lymphocytes Relative: 16 %
Lymphs Abs: 0.7 K/uL (ref 0.7–4.0)
MCH: 31.5 pg (ref 26.0–34.0)
MCHC: 31.9 g/dL (ref 30.0–36.0)
MCV: 98.9 fL (ref 80.0–100.0)
Monocytes Absolute: 0.6 K/uL (ref 0.1–1.0)
Monocytes Relative: 14 %
Neutro Abs: 2.6 K/uL (ref 1.7–7.7)
Neutrophils Relative %: 63 %
Platelets: 79 K/uL — ABNORMAL LOW (ref 150–400)
RBC: 2.79 MIL/uL — ABNORMAL LOW (ref 4.22–5.81)
RDW: 19.4 % — ABNORMAL HIGH (ref 11.5–15.5)
WBC: 4 K/uL (ref 4.0–10.5)
nRBC: 0 % (ref 0.0–0.2)

## 2024-02-14 MED ORDER — TRANEXAMIC ACID 650 MG PO TABS
1300.0000 mg | ORAL_TABLET | Freq: Two times a day (BID) | ORAL | Status: DC
  Administered 2024-02-15 – 2024-02-17 (×5): 1300 mg via ORAL
  Filled 2024-02-14 (×6): qty 2

## 2024-02-14 MED ORDER — TRANEXAMIC ACID-NACL 1000-0.7 MG/100ML-% IV SOLN
1000.0000 mg | Freq: Once | INTRAVENOUS | Status: AC
  Administered 2024-02-14: 1000 mg via INTRAVENOUS
  Filled 2024-02-14: qty 100

## 2024-02-14 NOTE — Plan of Care (Signed)
 1g TXA this morning with scheduled BID dosing starting tomorrow am

## 2024-02-14 NOTE — Plan of Care (Signed)

## 2024-02-14 NOTE — Progress Notes (Signed)
 "  [facility] PROGRESS NOTE   Jordan Oconnell FMW:[id number] DOB: [birth year] DOA: 02/09/2024  PCP: Shepard Ade, MD  Brief History: 84 y.o. male with medical history significant of hypertension, hyperlipidemia, GERD, hypothyroidism, CKD 4, solitary kidney, atrial fibrillation, tachybradycardia syndrome status post pacemaker, aortic stenosis status post TAVR, chronic diastolic CHF, depression, anemia, bladder cancer status post cystectomy and urostomy, left total nephrectomy, right partial nephrectomy, presenting with anemia and AKI versus progressive CKD as direct admission. Patient sent as a direct admission by cardiology after labs were obtained to further evaluate abnormal values on pacemaker transmission.  He did mention fatigue ongoing for 1 to 2 weeks.  He was noted to be profoundly anemic.  He was hospitalized for further management.  Consultants: Cardiology.  Urology  Procedures: None yet   Subjective/Interval History: Patient mentioned that he feels well.  Denies any chest pain shortness of breath nausea or vomiting.  No abdominal pain.  Has been ambulating in the hallway without difficulty..  Assessment/Plan:  Acute blood loss anemia Anemia most likely due to gross hematuria.  This is in the setting of Eliquis . Patient presented with hemoglobin of 5.5.  Has been transfused 4 units of PRBC so far.   Hemoglobin has been stable for the last 24 hours.  Continue to monitor twice daily for now.  Gross hematuria/history of bladder cancer/urothelial cancer/RCC This is in the setting of a complicated urological history with history of bladder cancer, renal cell cancer.  He is status post cystectomy and has a urostomy.  He also is status post partial right nephrectomy and total left nephrectomy. Has had gross hematuria the last several days. Eliquis  has been held.  He was also on aspirin  which has been held as well. Patient is followed at [facility] for urologic issues but wants to  transfer care locally if possible.  So urology was consulted. CT scan of the abdomen pelvis raises concern for hepatic metastases.  Also showed new retroperitoneal and right inguinal lymphadenopathy also concerning for metastatic disease.  Found to have mass in the right pelvic collecting system but this was seen on previous CT scan as well. Will need to be seen by medical oncology eventually.  No indication to involve them currently.  He has an appointment with his providers at [facility] in April. Urology is following.  TXA is being considered if bleeding does not subside.  This is a difficult situation.  Patient understands. Hematuria appears to be improving.  No significant clots noted.  Urine appears to be clearing up though still blood-tinged.  Hemoglobin stable for the last 24 hours. On prophylactic antibiotics with ceftriaxone .    Acute kidney injury on chronic kidney disease stage IV Patient with solitary kidney. Baseline creatinine seems to be between 2.5-3.0.  Higher creatinine noted.  Most likely due to volume loss.   He was given IV fluids with improvement in renal function.  Now back to baseline.  IV fluids discontinued.  Essential hypertension Continue amlodipine  carvedilol  and nitrates.  Avoid hypotension.  Will tolerate higher blood pressures.  Paroxysmal atrial fibrillation/atrial flutter with tachybrady/pacemaker in situ Patient was seen by cardiology.  Atrial fibrillation has been stable.  He is being continued on amiodarone  and carvedilol .  Eliquis  is on hold due to bleeding issues.  Will need to be on hold for the foreseeable future since bleeding is from his urothelial cancer for which most likely there is no surgical treatment option.   Patient understands the risk that holding anticoagulation poses in terms  of increased risk for stroke. Cardiology has signed off.  History of severe aortic stenosis status post TAVR Noted to be stable.  Chronic diastolic CHF/coronary artery  disease Recent echo showed normal LVEF. Has had episodes of NSVT which has been asymptomatic.  Continue to monitor on telemetry.  Continue carvedilol .  Hypothyroidism Continue levothyroxine .  Cholelithiasis Incidentally noted on CT scan.  Seems to be asymptomatic.  Abdominal wall hernia Asymptomatic.  DVT Prophylaxis: SCDs Code Status: Full code Family Communication: Discussed with patient Disposition Plan: Lives by himself.  Seen by PT and OT.  Has been ambulating without difficulty.    Medications: Scheduled:  sodium chloride    Intravenous Once   allopurinol   100 mg Oral Daily   ALPRAZolam   0.25 mg Oral QHS   amiodarone   200 mg Oral Daily   amLODipine   5 mg Oral Daily   atorvastatin   10 mg Oral QHS   carvedilol   12.5 mg Oral BID PC   feeding supplement  237 mL Oral BID BM   isosorbide  mononitrate  30 mg Oral Daily   levothyroxine   200 mcg Oral QAC breakfast   mirtazapine   7.5 mg Oral QHS   sodium bicarbonate   1,300 mg Oral BID   sodium chloride  flush  3 mL Intravenous Q12H   Continuous:  cefTRIAXone  (ROCEPHIN )  IV Stopped (02/13/24 1404)    PRN:acetaminophen  **OR** acetaminophen , polyethylene glycol  Objective:  Vital Signs  Vitals:   02/13/24 2033 02/14/24 0009 02/14/24 0434 02/14/24 0803  BP: 137/67 (!) 155/75 (!) 165/79 (!) 158/78  Pulse: 74 75 78 84  Resp: 17 17 18 19   Temp: 98.5 F (36.9 C) 98.5 F (36.9 C) 98.6 F (37 C) 98.3 F (36.8 C)  TempSrc: Oral Oral Oral Oral  SpO2: 98% 97% 96% 96%  Weight:      Height:        Intake/Output Summary (Last 24 hours) at 02/14/2024 1003 Last data filed at 02/14/2024 0556 Gross per 24 hour  Intake 812.64 ml  Output 1750 ml  Net -937.36 ml   Filed Weights   02/10/24 0730  Weight: 84.1 kg    General appearance: Awake alert.  In no distress Resp: Clear to auscultation bilaterally.  Normal effort Cardio: S1-S2 is normal regular.  No S3-S4.  No rubs murmurs or bruit GI: Abdomen is soft.  Nontender  nondistended.  Bowel sounds are present normal.  No masses organomegaly Urostomy noted.  Urine appears to be clearing up though still blood-tinged.  Lab Results:  Data Reviewed: I have personally reviewed following labs and reports of the imaging studies  CBC: Recent Labs  Lab 02/10/24 0154 02/10/24 0705 02/11/24 0421 02/11/24 1113 02/12/24 0323 02/12/24 1654 02/13/24 0602 02/13/24 1410 02/14/24 0437  WBC 3.7*  --  4.0  --  4.0  --  4.1  --  4.0  NEUTROABS  --   --  2.5  --  2.5  --  2.8  --  2.6  HGB 6.2*   < > 8.5*   < > 7.8* 7.6* 9.0* 8.9* 8.8*  HCT 20.1*   < > 26.0*   < > 23.7* 24.4* 28.1* 28.7* 27.6*  MCV 104.7*  --  99.2  --  100.4*  --  96.9  --  98.9  PLT 89*  --  86*  --  78*  --  80*  --  79*   < > = values in this interval not displayed.    Basic Metabolic Panel: Recent Labs  Lab 02/09/24 1833 02/10/24 0154 02/11/24 0421 02/11/24 0730 02/12/24 0323 02/13/24 0602 02/14/24 0437  NA 137 137 140  --  139 139 141  K 4.6 4.5 4.0  --  4.1 4.7 4.6  CL 105 107 110  --  110 111 112*  CO2 19* 19* 19*  --  18* 18* 19*  GLUCOSE 207* 87 97  --  101* 96 91  BUN 64* 63* 56*  --  54* 52* 53*  CREATININE 4.28* 4.10* 3.66*  --  3.20* 3.04* 2.75*  CALCIUM  8.5* 8.1* 8.2*  --  8.1* 8.7* 8.8*  MG 2.3  --   --  2.1  --   --   --     GFR: Estimated Creatinine Clearance: 22.6 mL/min (A) (by C-G formula based on SCr of 2.75 mg/dL (H)).  Liver Function Tests: Recent Labs  Lab 02/09/24 1833 02/10/24 0154  AST 17 16  ALT 12 9  ALKPHOS 43 38  BILITOT 0.3 0.5  PROT 5.6* 4.9*  ALBUMIN  3.6 3.1*   Coagulation Profile: Recent Labs  Lab 02/09/24 1833  INR 1.2    Radiology Studies: No results found.      LOS: 4 days   Robecca Fulgham [employer] on [email or web address]  02/14/2024, 10:03 AM   "

## 2024-02-14 NOTE — Progress Notes (Signed)
" ° °  °  Subjective: No acute events overnight.  Patient resting comfortably in bed, in good spirits as usual.  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  Urine appears to be clearing with no clots in the bag at this time.   CKD with Cr trending down.  Now 2.75.  Trend H&H.  Hbg 8.8. Transfuse < 7.0. S/p 1uPRBC  Limited options for treatment.  Will continue holding Eliquis  if medically reasonable and treat empirically for hemorrhagic cystitis.  I will reach out to his [facility] oncologist to see if there are remaining treatment options.    Intake/Output from previous day: 01/19 0701 - 01/20 0700 In: 1052.6 [P.O.:740; IV Piggyback:312.6] Out: 1750 [Urine:1750]  Intake/Output this shift: No intake/output data recorded.  Physical Exam:  General: Alert and oriented CV: No cyanosis Lungs: equal chest rise Gu: RLQ ileal conduit/urostomy draining clear red blood-tinged urine with minimal clot accumulation.  Lab Results: Recent Labs    02/13/24 0602 02/13/24 1410 02/14/24 0437  HGB 9.0* 8.9* 8.8*  HCT 28.1* 28.7* 27.6*   BMET Recent Labs    02/13/24 0602 02/13/24 1410 02/14/24 0437  NA 139  --  141  K 4.7  --  4.6  CL 111  --  112*  CO2 18*  --  19*  GLUCOSE 96  --  91  BUN 52*  --  53*  CREATININE 3.04*  --  2.75*  CALCIUM  8.7*  --  8.8*  HGB 9.0* 8.9* 8.8*  WBC 4.1  --  4.0     Studies/Results: No results found.    LOS: 4 days   Ole Bourdon, NP [facility] Pager: [phone]  02/14/2024, 9:28 AM   "

## 2024-02-15 ENCOUNTER — Ambulatory Visit: Admitting: Cardiovascular Disease

## 2024-02-15 DIAGNOSIS — D62 Acute posthemorrhagic anemia: Secondary | ICD-10-CM | POA: Diagnosis not present

## 2024-02-15 LAB — CBC WITH DIFFERENTIAL/PLATELET
Abs Immature Granulocytes: 0.07 K/uL (ref 0.00–0.07)
Basophils Absolute: 0 K/uL (ref 0.0–0.1)
Basophils Relative: 1 %
Eosinophils Absolute: 0.1 K/uL (ref 0.0–0.5)
Eosinophils Relative: 2 %
HCT: 29.1 % — ABNORMAL LOW (ref 39.0–52.0)
Hemoglobin: 9.3 g/dL — ABNORMAL LOW (ref 13.0–17.0)
Immature Granulocytes: 2 %
Lymphocytes Relative: 20 %
Lymphs Abs: 0.7 K/uL (ref 0.7–4.0)
MCH: 31.3 pg (ref 26.0–34.0)
MCHC: 32 g/dL (ref 30.0–36.0)
MCV: 98 fL (ref 80.0–100.0)
Monocytes Absolute: 0.5 K/uL (ref 0.1–1.0)
Monocytes Relative: 15 %
Neutro Abs: 2 K/uL (ref 1.7–7.7)
Neutrophils Relative %: 60 %
Platelets: 80 K/uL — ABNORMAL LOW (ref 150–400)
RBC: 2.97 MIL/uL — ABNORMAL LOW (ref 4.22–5.81)
RDW: 18.4 % — ABNORMAL HIGH (ref 11.5–15.5)
WBC: 3.3 K/uL — ABNORMAL LOW (ref 4.0–10.5)
nRBC: 0 % (ref 0.0–0.2)

## 2024-02-15 LAB — BASIC METABOLIC PANEL WITH GFR
Anion gap: 10 (ref 5–15)
BUN: 49 mg/dL — ABNORMAL HIGH (ref 8–23)
CO2: 21 mmol/L — ABNORMAL LOW (ref 22–32)
Calcium: 8.8 mg/dL — ABNORMAL LOW (ref 8.9–10.3)
Chloride: 109 mmol/L (ref 98–111)
Creatinine, Ser: 2.45 mg/dL — ABNORMAL HIGH (ref 0.61–1.24)
GFR, Estimated: 25 mL/min — ABNORMAL LOW
Glucose, Bld: 91 mg/dL (ref 70–99)
Potassium: 4.5 mmol/L (ref 3.5–5.1)
Sodium: 140 mmol/L (ref 135–145)

## 2024-02-15 NOTE — Progress Notes (Signed)
 Mobility Specialist Progress Note;    02/15/24 1137  Mobility  Activity Ambulated with assistance  Level of Assistance Standby assist, set-up cues, supervision of patient - no hands on  Assistive Device Cane  Distance Ambulated (ft) 10 ft  Activity Response Tolerated well  Mobility Referral Yes  Mobility visit 1 Mobility  Mobility Specialist Start Time (ACUTE ONLY) 1137  Mobility Specialist Stop Time (ACUTE ONLY) 1147  Mobility Specialist Time Calculation (min) (ACUTE ONLY) 10 min   Pt received in BR, able to ambulate back to bed. Declined further ambulation due to previously walked, SV for safety to bed. Pt returned to bed with call bell and personal belongings in reach. Bed alarm on. All need met. Will f/u as able.   Florine Oak Mobility Specialist Please Neurosurgeon or [employer] [phone]

## 2024-02-15 NOTE — Plan of Care (Signed)

## 2024-02-15 NOTE — Plan of Care (Signed)
  Problem: Clinical Measurements: Goal: Cardiovascular complication will be avoided Outcome: Progressing   Problem: Activity: Goal: Risk for activity intolerance will decrease Outcome: Progressing   Problem: Pain Managment: Goal: General experience of comfort will improve and/or be controlled Outcome: Progressing   Problem: Safety: Goal: Ability to remain free from injury will improve Outcome: Progressing

## 2024-02-15 NOTE — Care Management Important Message (Signed)
 Important Message  Patient Details  Name: Jordan Oconnell MRN: [id number] Date of Birth: [birth year]   Important Message Given:  Yes - Medicare IM     [name] Sharps 02/15/2024, 8:42 AM

## 2024-02-15 NOTE — Progress Notes (Signed)
 Mobility Specialist Progress Note;    02/15/24 1454  Mobility  Activity Ambulated with assistance  Level of Assistance Contact guard assist, steadying assist  Assistive Device Cane  Distance Ambulated (ft) 500 ft  Activity Response Tolerated well  Mobility Referral Yes  Mobility visit 1 Mobility  Mobility Specialist Start Time (ACUTE ONLY) 1454  Mobility Specialist Stop Time (ACUTE ONLY) 1512  Mobility Specialist Time Calculation (min) (ACUTE ONLY) 18 min   Patient received in bed agreeable to participate in mobility. No physical assistance required, CGA for safety. No complaints during ambulation. Once returned to room call bell was in reach as well personal belongings. All needs met. Bed alarm on.    Ricky Janeal MATSU, BS Mobility Specialist Please contact via Special Educational Needs Teacher or [employer] [phone]

## 2024-02-15 NOTE — Progress Notes (Addendum)
 Physical Therapy Treatment Patient Details Name: Jordan Oconnell MRN: [id number] DOB: 1940/09/29 Today's Date: 02/15/2024   History of Present Illness Pt is Jordan 84 y.o. M presenting to [facility] on 02/09/24 from cardiology for evaluation of abnormal values on pacemaker transmission. Pt found to be severely anemic. Also with gross hematuria, CT imaging concerning for hepatic metastasis and concern for metastatic disease. PMHx: bladder cancer/urothelial cancer, s/p cystectomy and partial R nephrectomy, total nephrectomy, GERD, HLD, HTN, hypothyroidism, CKD, tachybradycardia syndrome, anemia, AKI, CKD.   PT Comments  Pt received supine in bed and agreeable to PT session. Pt reported ambulating the full length of the hallway <1 hour prior to PT visit. Pt was agreeable to mobilize again, however, was limited by fatigue and decreased activity tolerance. Able to stand and ambulate in the room with CGA and use of SP cane. Pt requested to return to supine with pt able to demonstrate LE exercises. Discussed building activity tolerance with short distance gait trials and resting when fatigued. Will continue to follow acutely.     If plan is discharge home, recommend the following: Assist for transportation   Can travel by private vehicle      Yes  Equipment Recommendations  None recommended by PT       Precautions / Restrictions Precautions Precautions: Fall Recall of Precautions/Restrictions: Intact Restrictions Weight Bearing Restrictions Per Provider Order: No     Mobility  Bed Mobility Overal bed mobility: Modified Independent     General bed mobility comments: increased time to complete with HOB slightly elevated    Transfers Overall transfer level: Needs assistance Equipment used: Straight cane, None Transfers: Sit to/from Stand Sit to Stand: Contact guard assist    General transfer comment: CGA with BUE on EOB. Initially bracing BLE on back of bed with wide BOS.     Ambulation/Gait Ambulation/Gait assistance: Contact guard assist Gait Distance (Feet): 20 Feet Assistive device: Straight cane Gait Pattern/deviations: Step-through pattern, Decreased stride length, Trunk flexed, Knee flexed in stance - right, Knee flexed in stance - left, Wide base of support Gait velocity: decr    General Gait Details: CGA for safety with wide BOS and knees resting in slight knee flexion in stance phase. Distance limited by fatigue from earlier ambulation     Balance Overall balance assessment: Mild deficits observed, not formally tested     Communication Communication Communication: Impaired Factors Affecting Communication: Hearing impaired  Cognition Arousal: Alert Behavior During Therapy: WFL for tasks assessed/performed   PT - Cognitive impairments: No apparent impairments    Following commands: Intact      Cueing Cueing Techniques: Verbal cues, Gestural cues  Exercises General Exercises - Lower Extremity Ankle Circles/Pumps: AROM, Both, 15 reps, Supine Quad Sets: AROM, Both, 5 reps, Supine Heel Slides: AROM, Left, Supine (1 rep) Straight Leg Raises: AROM, Left, Supine (1 rep)        Pertinent Vitals/Pain Pain Assessment Pain Assessment: No/denies pain     PT Goals (current goals can now be found in the care plan section) Acute Rehab PT Goals Patient Stated Goal: to go home PT Goal Formulation: With patient Time For Goal Achievement: 02/25/24 Potential to Achieve Goals: Good Progress towards PT goals: Progressing toward goals    Frequency    Min 1X/week       AM-PAC PT 6 Clicks Mobility   Outcome Measure  Help needed turning from your back to your side while in a flat bed without using bedrails?: None Help needed moving from lying  on your back to sitting on the side of a flat bed without using bedrails?: None Help needed moving to and from a bed to a chair (including a wheelchair)?: A Little Help needed standing up from a  chair using your arms (e.g., wheelchair or bedside chair)?: A Little Help needed to walk in hospital room?: A Little Help needed climbing 3-5 steps with a railing? : A Lot 6 Click Score: 19    End of Session   Activity Tolerance: Patient limited by fatigue Patient left: in bed;with call bell/phone within reach Nurse Communication: Mobility status PT Visit Diagnosis: Other abnormalities of gait and mobility (R26.89);Muscle weakness (generalized) (M62.81)     Time: 9053-8991 PT Time Calculation (min) (ACUTE ONLY): 22 min  Charges:    $Therapeutic Exercise: 8-22 mins PT General Charges $$ ACUTE PT VISIT: 1 Visit                    Kate ORN, PT, DPT Secure Chat Preferred  Rehab Office [phone]   Kate BRAVO [name] 02/15/2024, 12:02 PM

## 2024-02-15 NOTE — Progress Notes (Signed)
 " PROGRESS NOTE  Jordan Oconnell  FMW:[id number] DOB: Jan 09, 1941 DOA: 02/09/2024 PCP: Shepard Ade, MD   Brief Narrative: Patient is a 84 year old male with history of hypertension, hyperlipidemia, GERD, hypothyroidism, secondary , solitary kidney, A-fib/tachybradycardia syndrome/status post placement replacement, aortic stenosis status post TAVR, chronic diastolic CHF, depression, anemia, bladder cancer status post cystectomy/urostomy, left total nephrectomy, right partial nephrectomy presented as a direct admission from from cardiology office after he presented with fatigue, reported blood in urine in nephrostomy bag.  On presentation, he was found to be anemic, lab work showed AKI.  Urology, cardiology consulted.  Continues to have hematuria, hemoglobin stable today.  Assessment & Plan:  Principal Problem:   Acute blood loss anemia Active Problems:   Essential hypertension   Bladder cancer (HCC)   PAF (paroxysmal atrial fibrillation) (HCC)   CKD (chronic kidney disease) stage 4, GFR 15-29 ml/min (HCC)   Severe aortic stenosis   S/P TAVR (transcatheter aortic valve replacement)   Pacemaker   Tachycardia-bradycardia syndrome (HCC)   Chronic combined systolic and diastolic CHF (congestive heart failure) (HCC)   Hypothyroidism   Gastro-esophageal reflux disease without esophagitis   Hyperlipidemia   Major depression, single episode   Presence of urostomy (HCC)   Heart block AV complete (HCC)   Coronary artery disease involving native coronary artery of native heart without angina pectoris   Hypothyroidism due to amiodarone   Acute blood loss anemia secondary to hematuria: Presented with gross hematuria.  Takes Eliquis .  On presentation, hemoglobin was 5.5.  Given 4 units of PRBCs so far.  Hemoglobin has been stable in the range of 9  Gross hematuria/history of bladder cancer/urothelial cancer/RCC : Has complicated urological history.  Status post cystectomy, ureteral stone.  Also  status post partial right nephrectomy, total nephrectomy.  Presented with gross hematuria.  Eliquis , aspirin  on hold.  Follows with [facility] urology/oncology.  Urology following here.  CT imaging showed concern for hepatic metastasis, also showed new retroperitoneal and right inguinal lymphadenopathy concerning for metastatic disease, mass in the right pelvic collecting system.  Urology contacted with his oncologist at [facility].  TXA being considered if bleeding does not subside.  Hematuria improving.  On prophylactic antibiotic with ceftriaxone   AKI on CKD stage IV: Baseline creatinine from 2.5-3.  Currently kidney function at baseline.  IV fluid discontinued  Hypertension: On amlodipine , carvedilol , nitrates  Paroxysmal A-fib/A-flutter/history of tachybradycardia syndrome status post pacemaker placement: Cardiology was consulted.  Rate currently stable.  Eliquis  on hold.  Needs to be held for foreseeable future.  Has increased risk for stroke.  Cardiology signed out  History of severe aortic stenosis: Status post TAVR.  Follows with cardiology  Chronic diastolic CHF/CAD: Echo has shown normal left ventricular ejection fraction.  Had episodes of NSVT, currently asymptomatic..  Monitor on telemetry.  Continue coreg   Hypothyroidism :Continue levothyroxine   Cholelithiasis:Incidentally seen on CT.  Asymptomatic.  Also has abdominal hernia  Deconditioning/debility: Patient lives alone.  Still driving.  Does not have family.  Patient recommended outpatient follow-up.             DVT prophylaxis:SCDs Start: 02/09/24 1628     Code Status: Full Code  Family Communication: None at bedside  Patient status:Inpatient  Patient is from :home  Anticipated discharge un:ynfz  Estimated DC date:cardiology,urology   Consultants: Urology, cardiology  Procedures: None yet  Antimicrobials:  Anti-infectives (From admission, onward)    Start     Dose/Rate Route Frequency Ordered Stop   02/11/24  1130  cefTRIAXone  (ROCEPHIN ) 1  g in sodium chloride  0.9 % 100 mL IVPB        1 g 200 mL/hr over 30 Minutes Intravenous Every 24 hours 02/11/24 1031 02/16/24 1129       Subjective: Patient seen and examined at bedside today.  Hemodynamically stable.  Lying on bed.  Appears comfortable.  Does not appear to be in any Distress.  Urine bag has red urine.  Hemoglobin stable.  No new complaints today.  Objective: Vitals:   02/14/24 1943 02/15/24 0020 02/15/24 0427 02/15/24 0842  BP: (!) 131/96 (!) 158/80 (!) 159/78 (!) 155/70  Pulse: 78 75 76 88  Resp: 17 17 16  (!) 24  Temp: 98 F (36.7 C) 98.4 F (36.9 C) 98.5 F (36.9 C) 97.6 F (36.4 C)  TempSrc: Oral Oral Oral Oral  SpO2: 100% 99% 100% 98%  Weight:      Height:        Intake/Output Summary (Last 24 hours) at 02/15/2024 1014 Last data filed at 02/15/2024 0732 Gross per 24 hour  Intake --  Output 2200 ml  Net -2200 ml   Filed Weights   02/10/24 0730  Weight: 84.1 kg    Examination:  General exam: Overall comfortable, not in distress, pleasant elderly gentleman HEENT: PERRL Respiratory system:  no wheezes or crackles  Cardiovascular system: Paced rhythm Gastrointestinal system: Abdomen is nondistended, soft and nontender. Urostomy Central nervous system: Alert and oriented Extremities: No edema, no clubbing ,no cyanosis Skin: No rashes, no ulcers,no icterus     Data Reviewed: I have personally reviewed following labs and imaging studies  CBC: Recent Labs  Lab 02/11/24 0421 02/11/24 1113 02/12/24 0323 02/12/24 1654 02/13/24 0602 02/13/24 1410 02/14/24 0437 02/14/24 1410 02/15/24 0437  WBC 4.0  --  4.0  --  4.1  --  4.0  --  3.3*  NEUTROABS 2.5  --  2.5  --  2.8  --  2.6  --  2.0  HGB 8.5*   < > 7.8*   < > 9.0* 8.9* 8.8* 9.1* 9.3*  HCT 26.0*   < > 23.7*   < > 28.1* 28.7* 27.6* 29.2* 29.1*  MCV 99.2  --  100.4*  --  96.9  --  98.9  --  98.0  PLT 86*  --  78*  --  80*  --  79*  --  80*   < > = values in this  interval not displayed.   Basic Metabolic Panel: Recent Labs  Lab 02/09/24 1833 02/10/24 0154 02/11/24 0421 02/11/24 0730 02/12/24 0323 02/13/24 0602 02/14/24 0437 02/15/24 0437  NA 137   < > 140  --  139 139 141 140  K 4.6   < > 4.0  --  4.1 4.7 4.6 4.5  CL 105   < > 110  --  110 111 112* 109  CO2 19*   < > 19*  --  18* 18* 19* 21*  GLUCOSE 207*   < > 97  --  101* 96 91 91  BUN 64*   < > 56*  --  54* 52* 53* 49*  CREATININE 4.28*   < > 3.66*  --  3.20* 3.04* 2.75* 2.45*  CALCIUM  8.5*   < > 8.2*  --  8.1* 8.7* 8.8* 8.8*  MG 2.3  --   --  2.1  --   --   --   --    < > = values in this interval not displayed.  No results found for this or any previous visit (from the past 240 hours).   Radiology Studies: No results found.  Scheduled Meds:  sodium chloride    Intravenous Once   allopurinol   100 mg Oral Daily   ALPRAZolam   0.25 mg Oral QHS   amiodarone   200 mg Oral Daily   amLODipine   5 mg Oral Daily   atorvastatin   10 mg Oral QHS   carvedilol   12.5 mg Oral BID PC   feeding supplement  237 mL Oral BID BM   isosorbide  mononitrate  30 mg Oral Daily   levothyroxine   200 mcg Oral QAC breakfast   mirtazapine   7.5 mg Oral QHS   sodium bicarbonate   1,300 mg Oral BID   sodium chloride  flush  3 mL Intravenous Q12H   tranexamic acid   1,300 mg Oral BID   Continuous Infusions:  cefTRIAXone  (ROCEPHIN )  IV 1 g (02/14/24 1138)     LOS: 5 days   Ivonne Mustache, MD [facility] P1/21/2026, 10:14 AM  "

## 2024-02-15 NOTE — Progress Notes (Signed)
" ° °  °  Subjective: Ryett was sleeping on rounds. I did not wake him.   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 Good improvement overnight.  Trend H&H.  Transfuse < 7.0. S/p 1uPRBC  Limited options for treatment.  Will continue holding Eliquis  if medically reasonable and treat empirically for hemorrhagic cystitis.  Nearly impossible to differentiate chronic colonization from hemorrhagic cystitis and a longstanding ileal conduit, but we will need to take this off the diagnostic table.  Failing conservative treatment will consider workup for upper tract bleeding.  Factoring that he has one partially functioning kidney and a significant baseline kidney disease, we will avoid contrasted studies unless absolutely necessary.  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   Intake/Output from previous day: 01/20 0701 - 01/21 0700 In: -  Out: 1750 [Urine:1750]  Intake/Output this shift: Total I/O In: -  Out: 450 [Urine:450]  Physical Exam:  General: sleeping CV: No cyanosis Lungs: equal chest rise Gu: RLQ ileal conduit/urostomy draining clear light red blood-tinged urine.  Lab Results: Recent Labs    02/14/24 0437 02/14/24 1410 02/15/24 0437  HGB 8.8* 9.1* 9.3*  HCT 27.6* 29.2* 29.1*   BMET Recent Labs    02/14/24 0437 02/14/24 1410 02/15/24 0437  NA 141  --  140  K 4.6  --  4.5  CL 112*  --  109  CO2 19*  --  21*  GLUCOSE 91  --  91  BUN 53*  --  49*  CREATININE 2.75*  --  2.45*  CALCIUM  8.8*   --  8.8*  HGB 8.8* 9.1* 9.3*  WBC 4.0  --  3.3*     Studies/Results: No results found.    LOS: 5 days   Ole Bourdon, NP [facility] Pager: [phone]  02/15/2024, 10:44 AM   "

## 2024-02-16 DIAGNOSIS — D62 Acute posthemorrhagic anemia: Secondary | ICD-10-CM | POA: Diagnosis not present

## 2024-02-16 LAB — BASIC METABOLIC PANEL WITH GFR
Anion gap: 13 (ref 5–15)
BUN: 51 mg/dL — ABNORMAL HIGH (ref 8–23)
CO2: 19 mmol/L — ABNORMAL LOW (ref 22–32)
Calcium: 8.9 mg/dL (ref 8.9–10.3)
Chloride: 110 mmol/L (ref 98–111)
Creatinine, Ser: 2.68 mg/dL — ABNORMAL HIGH (ref 0.61–1.24)
GFR, Estimated: 23 mL/min — ABNORMAL LOW
Glucose, Bld: 91 mg/dL (ref 70–99)
Potassium: 4.5 mmol/L (ref 3.5–5.1)
Sodium: 141 mmol/L (ref 135–145)

## 2024-02-16 LAB — CBC
HCT: 30.6 % — ABNORMAL LOW (ref 39.0–52.0)
Hemoglobin: 9.5 g/dL — ABNORMAL LOW (ref 13.0–17.0)
MCH: 30.5 pg (ref 26.0–34.0)
MCHC: 31 g/dL (ref 30.0–36.0)
MCV: 98.4 fL (ref 80.0–100.0)
Platelets: 86 K/uL — ABNORMAL LOW (ref 150–400)
RBC: 3.11 MIL/uL — ABNORMAL LOW (ref 4.22–5.81)
RDW: 17.5 % — ABNORMAL HIGH (ref 11.5–15.5)
WBC: 4.5 K/uL (ref 4.0–10.5)
nRBC: 0 % (ref 0.0–0.2)

## 2024-02-16 NOTE — Progress Notes (Addendum)
" ° °  °  Subjective: NAEON. Hematuria slightly darker today. Reviewed full case and plan. Ana was pleasant as usual and expressed understanding.   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 Good improvement initially with some worsened bleeding today. One long stringy clot appeared to be cast of ureter, increasing concern for upper tract bleeding from known cancer.   6/7d ceftriaxone  today. Consider discharge tomorrow after final dose. Unlikely to be hemorrhagic UTI  Trend H&H.  Transfuse < 7.0. improved Hgb for last 4 checks.   Limited options for treatment.  Will continue holding Eliquis  if medically reasonable.   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 He has few options, but there were some considerations. Encouraged close follow up with them this week.   Intake/Output from previous day: 01/21 0701 - 01/22 0700 In: -  Out: 1150 [Urine:1150]  Intake/Output this shift: No intake/output data recorded.  Physical Exam:  General: sleeping CV: No cyanosis Lungs: equal chest rise Gu: RLQ ileal conduit/urostomy draining clear light red blood-tinged urine.  Lab Results: Recent Labs    02/14/24 1410 02/15/24 0437 02/16/24 0359  HGB 9.1* 9.3* 9.5*  HCT 29.2* 29.1* 30.6*   BMET Recent Labs    02/15/24 0437 02/16/24 0359  NA 140 141  K 4.5 4.5  CL 109 110  CO2 21* 19*  GLUCOSE 91 91  BUN 49* 51*  CREATININE 2.45* 2.68*  CALCIUM  8.8* 8.9  HGB 9.3* 9.5*  WBC 3.3* 4.5     Studies/Results: No results found.     LOS: 6 days   Ole Bourdon, NP [facility] Pager: [phone]  02/16/2024, 11:30 AM   "

## 2024-02-16 NOTE — Progress Notes (Signed)
 " PROGRESS NOTE  Jordan Oconnell  FMW:[id number] DOB: [birth year] DOA: 02/09/2024 PCP: Shepard Ade, MD   Brief Narrative: Patient is a 84 year old male with history of hypertension, hyperlipidemia, GERD, hypothyroidism, secondary , solitary kidney, A-fib/tachybradycardia syndrome/status post placement replacement, aortic stenosis status post TAVR, chronic diastolic CHF, depression, anemia, bladder cancer status post cystectomy/urostomy, left total nephrectomy, right partial nephrectomy presented as a direct admission from from cardiology office after he presented with fatigue, reported blood in urine in nephrostomy bag.  On presentation, he was found to be anemic, lab work showed AKI.  Urology, cardiology consulted.  Continues to have hematuria, hemoglobin stable today.  Planning to keep him inpatient until definitive plan for  management of hematuria.  Urology closely following  Assessment & Plan:  Principal Problem:   Acute blood loss anemia Active Problems:   Essential hypertension   Bladder cancer (HCC)   PAF (paroxysmal atrial fibrillation) (HCC)   CKD (chronic kidney disease) stage 4, GFR 15-29 ml/min (HCC)   Severe aortic stenosis   S/P TAVR (transcatheter aortic valve replacement)   Pacemaker   Tachycardia-bradycardia syndrome (HCC)   Chronic combined systolic and diastolic CHF (congestive heart failure) (HCC)   Hypothyroidism   Gastro-esophageal reflux disease without esophagitis   Hyperlipidemia   Major depression, single episode   Presence of urostomy (HCC)   Heart block AV complete (HCC)   Coronary artery disease involving native coronary artery of native heart without angina pectoris   Hypothyroidism due to amiodarone   Acute blood loss anemia secondary to hematuria: Presented with gross hematuria.  Takes Eliquis .  On presentation, hemoglobin was 5.5.  Given 4 units of PRBCs so far.  Hemoglobin has been stable in the range of 9  Gross hematuria/history of bladder  cancer/urothelial cancer/RCC : Has complicated urological history.  Status post cystectomy, ureteral stone.  Also status post partial right nephrectomy, total nephrectomy.  Presented with gross hematuria.  Eliquis , aspirin  on hold.  Follows with [facility] urology/oncology.  Urology following here.  CT imaging showed concern for hepatic metastasis, also showed new retroperitoneal and right inguinal lymphadenopathy concerning for metastatic disease, mass in the right pelvic collecting system.  Urology contacted with his oncologist at [facility]. Started on tranexamic acid .  Finished five days course of  ceftriaxone .  Urine in the bag still looks pink/red but hemoglobin is stable  AKI on CKD stage IV: Baseline creatinine from 2.5-3.  Currently kidney function at baseline.  IV fluid discontinued  Hypertension: On amlodipine , carvedilol , nitrates  Paroxysmal A-fib/A-flutter/history of tachybradycardia syndrome status post pacemaker placement: Cardiology was consulted.  Rate currently stable.  Eliquis  on hold.  Needs to be held for foreseeable future.  Has increased risk for stroke.  Cardiology signed out  History of severe aortic stenosis: Status post TAVR.  Follows with cardiology  Chronic diastolic CHF/CAD: Echo has shown normal left ventricular ejection fraction.  Had episodes of NSVT, currently asymptomatic..  Monitor on telemetry.  Continue coreg   Hypothyroidism :Continue levothyroxine   Cholelithiasis:Incidentally seen on CT.  Asymptomatic.  Also has abdominal hernia  Deconditioning/debility: Patient lives alone.  Still driving.  Does not have family.  PT recommended outpatient follow-up.             DVT prophylaxis:SCDs Start: 02/09/24 1628     Code Status: Full Code  Family Communication: None at bedside  Patient status:Inpatient  Patient is from :home  Anticipated discharge un:ynfz  Estimated DC date: After urology clearance, 1 to 2 days   Consultants: Urology,  cardiology  Procedures: None yet  Antimicrobials:  Anti-infectives (From admission, onward)    Start     Dose/Rate Route Frequency Ordered Stop   02/11/24 1130  cefTRIAXone  (ROCEPHIN ) 1 g in sodium chloride  0.9 % 100 mL IVPB        1 g 200 mL/hr over 30 Minutes Intravenous Every 24 hours 02/11/24 1031 02/16/24 9077       Subjective: Patient seen and examined at bedside today.  Lying on bed.  No new complaints.  Hemoglobin stable.  Urine in the bag is still dark pink/red.  Objective: Vitals:   02/15/24 1926 02/16/24 0005 02/16/24 0448 02/16/24 0756  BP: 130/65 (!) 144/74 (!) 155/71 (!) (P) 158/74  Pulse: 64 74 74 81  Resp: 20  18 (P) 17  Temp: 97.9 F (36.6 C) 97.7 F (36.5 C) 97.6 F (36.4 C) (P) 97.9 F (36.6 C)  TempSrc: Oral Oral Oral (P) Oral  SpO2: 96% 93% 96% 95%  Weight:      Height:        Intake/Output Summary (Last 24 hours) at 02/16/2024 1023 Last data filed at 02/15/2024 1927 Gross per 24 hour  Intake --  Output 700 ml  Net -700 ml   Filed Weights   02/10/24 0730  Weight: 84.1 kg    Examination:   General exam: Overall comfortable, not in distress, pleasant elderly gentleman HEENT: PERRL Respiratory system:  no wheezes or crackles  Cardiovascular system: Paced rhythm Gastrointestinal system: Abdomen is nondistended, soft and nontender.  Urostomy, red urine in the bag Central nervous system: Alert and oriented Extremities: No edema, no clubbing ,no cyanosis Skin: No rashes, no ulcers,no icterus     Data Reviewed: I have personally reviewed following labs and imaging studies  CBC: Recent Labs  Lab 02/11/24 0421 02/11/24 1113 02/12/24 0323 02/12/24 1654 02/13/24 0602 02/13/24 1410 02/14/24 0437 02/14/24 1410 02/15/24 0437 02/16/24 0359  WBC 4.0  --  4.0  --  4.1  --  4.0  --  3.3* 4.5  NEUTROABS 2.5  --  2.5  --  2.8  --  2.6  --  2.0  --   HGB 8.5*   < > 7.8*   < > 9.0* 8.9* 8.8* 9.1* 9.3* 9.5*  HCT 26.0*   < > 23.7*   < > 28.1*  28.7* 27.6* 29.2* 29.1* 30.6*  MCV 99.2  --  100.4*  --  96.9  --  98.9  --  98.0 98.4  PLT 86*  --  78*  --  80*  --  79*  --  80* 86*   < > = values in this interval not displayed.   Basic Metabolic Panel: Recent Labs  Lab 02/09/24 1833 02/10/24 0154 02/11/24 0730 02/12/24 0323 02/13/24 0602 02/14/24 0437 02/15/24 0437 02/16/24 0359  NA 137   < >  --  139 139 141 140 141  K 4.6   < >  --  4.1 4.7 4.6 4.5 4.5  CL 105   < >  --  110 111 112* 109 110  CO2 19*   < >  --  18* 18* 19* 21* 19*  GLUCOSE 207*   < >  --  101* 96 91 91 91  BUN 64*   < >  --  54* 52* 53* 49* 51*  CREATININE 4.28*   < >  --  3.20* 3.04* 2.75* 2.45* 2.68*  CALCIUM  8.5*   < >  --  8.1* 8.7* 8.8* 8.8* 8.9  MG 2.3  --  2.1  --   --   --   --   --    < > = values in this interval not displayed.     No results found for this or any previous visit (from the past 240 hours).   Radiology Studies: No results found.  Scheduled Meds:  sodium chloride    Intravenous Once   allopurinol   100 mg Oral Daily   ALPRAZolam   0.25 mg Oral QHS   amiodarone   200 mg Oral Daily   amLODipine   5 mg Oral Daily   atorvastatin   10 mg Oral QHS   carvedilol   12.5 mg Oral BID PC   feeding supplement  237 mL Oral BID BM   isosorbide  mononitrate  30 mg Oral Daily   levothyroxine   200 mcg Oral QAC breakfast   mirtazapine   7.5 mg Oral QHS   sodium bicarbonate   1,300 mg Oral BID   sodium chloride  flush  3 mL Intravenous Q12H   tranexamic acid   1,300 mg Oral BID   Continuous Infusions:     LOS: 6 days   Ivonne Mustache, MD [facility] P1/22/2026, 10:23 AM  "

## 2024-02-16 NOTE — Plan of Care (Signed)
  Problem: Clinical Measurements: Goal: Cardiovascular complication will be avoided Outcome: Progressing   Problem: Pain Managment: Goal: General experience of comfort will improve and/or be controlled Outcome: Progressing   Problem: Safety: Goal: Ability to remain free from injury will improve Outcome: Progressing

## 2024-02-16 NOTE — Progress Notes (Signed)
 Mobility Specialist Progress Note;   02/16/24 1111  Mobility  Activity Ambulated with assistance  Level of Assistance Contact guard assist, steadying assist  Assistive Device Cane  Distance Ambulated (ft) 20 ft  Activity Response Tolerated well  Mobility Referral Yes  Mobility visit 1 Mobility  Mobility Specialist Start Time (ACUTE ONLY) 1111  Mobility Specialist Stop Time (ACUTE ONLY) 1127  Mobility Specialist Time Calculation (min) (ACUTE ONLY) 16 min    Patient received in bed agreeable to participate in mobility.Pt requested to use BR before mobility. MS assisted in pericare. Pt requested to go back to bed and declined further mobility due to SOB and it taking lots of energy. No physical assistance required, CGA for safety. Pt Was left in bed with call bell and personal belongings in reach. Bed alarm on. All needs met.   Florine Oak Mobility Specialist Please Neurosurgeon or [employer] [phone]

## 2024-02-16 NOTE — Plan of Care (Signed)

## 2024-02-16 NOTE — TOC Progression Note (Signed)
 Transition of Care [facility]) - Progression Note    Patient Details  Name: Jordan Oconnell MRN: [id number] Date of Birth: 16-Nov-1940  [facility] CM/SW Contact  Graves-Bigelow, Erminio Deems, RN Phone Number: 02/16/2024, 5:32 PM  Clinical Narrative: Patient was discussed in progression rounds. Urology is following for hematuria. ICM will continue to follow for additional disposition needs as the patient progresses.   Expected Discharge Plan: Home/Self Care Barriers to Discharge: Continued Medical Work up  Expected Discharge Plan and Services   Discharge Planning Services: CM Consult   Living arrangements for the past 2 months: Single Family Home  Social Drivers of Health (SDOH) Interventions SDOH Screenings   Food Insecurity: No Food Insecurity (02/09/2024)  Housing: Low Risk (02/09/2024)  Transportation Needs: No Transportation Needs (02/09/2024)  Utilities: Not At Risk (02/09/2024)  Social Connections: Unknown (02/09/2024)  Tobacco Use: Low Risk (02/10/2024)    Readmission Risk Interventions    05/04/2023   10:12 AM 03/08/2023   12:36 PM  Readmission Risk Prevention Plan  Transportation Screening Complete Complete  PCP or Specialist Appt within 3-5 Days Complete Complete  HRI or Home Care Consult Complete Complete  Social Work Consult for Recovery Care Planning/Counseling Complete Complete  Palliative Care Screening Not Applicable Not Applicable  Medication Review Oceanographer) Complete Complete

## 2024-02-17 DIAGNOSIS — D62 Acute posthemorrhagic anemia: Secondary | ICD-10-CM | POA: Diagnosis not present

## 2024-02-17 LAB — CBC
HCT: 26.4 % — ABNORMAL LOW (ref 39.0–52.0)
Hemoglobin: 8.4 g/dL — ABNORMAL LOW (ref 13.0–17.0)
MCH: 30.5 pg (ref 26.0–34.0)
MCHC: 31.8 g/dL (ref 30.0–36.0)
MCV: 96 fL (ref 80.0–100.0)
Platelets: 84 K/uL — ABNORMAL LOW (ref 150–400)
RBC: 2.75 MIL/uL — ABNORMAL LOW (ref 4.22–5.81)
RDW: 16.8 % — ABNORMAL HIGH (ref 11.5–15.5)
WBC: 3.9 K/uL — ABNORMAL LOW (ref 4.0–10.5)
nRBC: 0 % (ref 0.0–0.2)

## 2024-02-17 LAB — BASIC METABOLIC PANEL WITH GFR
Anion gap: 10 (ref 5–15)
BUN: 53 mg/dL — ABNORMAL HIGH (ref 8–23)
CO2: 19 mmol/L — ABNORMAL LOW (ref 22–32)
Calcium: 8.8 mg/dL — ABNORMAL LOW (ref 8.9–10.3)
Chloride: 110 mmol/L (ref 98–111)
Creatinine, Ser: 2.91 mg/dL — ABNORMAL HIGH (ref 0.61–1.24)
GFR, Estimated: 21 mL/min — ABNORMAL LOW
Glucose, Bld: 96 mg/dL (ref 70–99)
Potassium: 4.2 mmol/L (ref 3.5–5.1)
Sodium: 139 mmol/L (ref 135–145)

## 2024-02-17 LAB — MAGNESIUM: Magnesium: 2.1 mg/dL (ref 1.7–2.4)

## 2024-02-17 MED ORDER — SODIUM CHLORIDE 0.9 % IV SOLN: INTRAVENOUS | Status: DC

## 2024-02-17 NOTE — Progress Notes (Signed)
 Physical Therapy Treatment Patient Details Name: Jordan Oconnell MRN: [id number] DOB: 25-Jul-1940 Today's Date: 02/17/2024   History of Present Illness Pt is an 84 y.o. M presenting to [facility] on 02/09/24 from cardiology for evaluation of abnormal values on pacemaker transmission. Pt found to be severely anemic. Also with gross hematuria, CT imaging concerning for hepatic metastasis and concern for metastatic disease. PMHx: bladder cancer/urothelial cancer, s/p cystectomy and partial R nephrectomy, total nephrectomy, GERD, HLD, HTN, hypothyroidism, CKD, tachybradycardia syndrome, anemia, AKI, CKD.   PT Comments  Pt received ambulating in the hallway with mobility specialists and agreeable to PT session. Pt continues to be CGA when ambulating with SP cane due to slight unsteadiness that improves with increased distance. Pt was fatigued after gait training and requested to return to supine. Able to perform multiple repetitions for LE exercises while in supine. Pt continues to feel like he is at his baseline with no need for post-acute PT. Will continue to follow acutely.    If plan is discharge home, recommend the following: Assist for transportation   Can travel by private vehicle      Yes  Equipment Recommendations  None recommended by PT           Mobility  Bed Mobility Overal bed mobility: Modified Independent     Transfers Overall transfer level: Needs assistance Equipment used: Straight cane Transfers: Sit to/from Stand Sit to Stand: Supervision    General transfer comment: supervision to return to seated position from standing    Ambulation/Gait Ambulation/Gait assistance: Contact guard assist Gait Distance (Feet): 60 Feet Assistive device: Straight cane Gait Pattern/deviations: Step-through pattern, Decreased stride length, Trunk flexed, Knee flexed in stance - right, Knee flexed in stance - left, Wide base of support Gait velocity: decr     General Gait Details:  supervision for safety with wide BOS and knees slightly flexed in stance phase. Further distance limited by fatigue     Balance Overall balance assessment: Mild deficits observed, not formally tested       Communication Communication Communication: Impaired Factors Affecting Communication: Hearing impaired  Cognition Arousal: Alert Behavior During Therapy: WFL for tasks assessed/performed   PT - Cognitive impairments: No apparent impairments   Following commands: Intact      Cueing Cueing Techniques: Verbal cues, Gestural cues  Exercises General Exercises - Lower Extremity Ankle Circles/Pumps: AROM, Both, 15 reps, Supine Heel Slides: AROM, Both, Supine (50 reps) Hip ABduction/ADduction: AROM, Both, Supine (50 reps) Straight Leg Raises: AROM, Both, Supine (50 reps)        Pertinent Vitals/Pain Pain Assessment Pain Assessment: No/denies pain     PT Goals (current goals can now be found in the care plan section) Acute Rehab PT Goals Patient Stated Goal: to go home PT Goal Formulation: With patient Time For Goal Achievement: 02/25/24 Potential to Achieve Goals: Good Progress towards PT goals: Progressing toward goals    Frequency    Min 1X/week       AM-PAC PT 6 Clicks Mobility   Outcome Measure  Help needed turning from your back to your side while in a flat bed without using bedrails?: None Help needed moving from lying on your back to sitting on the side of a flat bed without using bedrails?: None Help needed moving to and from a bed to a chair (including a wheelchair)?: A Little Help needed standing up from a chair using your arms (e.g., wheelchair or bedside chair)?: A Little Help needed to walk in hospital room?:  A Little Help needed climbing 3-5 steps with a railing? : A Lot 6 Click Score: 19    End of Session Equipment Utilized During Treatment: Gait belt Activity Tolerance: Patient limited by fatigue Patient left: in bed;with call bell/phone  within reach Nurse Communication: Mobility status PT Visit Diagnosis: Other abnormalities of gait and mobility (R26.89);Muscle weakness (generalized) (M62.81)     Time: 8948-8890 PT Time Calculation (min) (ACUTE ONLY): 18 min  Charges:    $Therapeutic Exercise: 8-22 mins PT General Charges $$ ACUTE PT VISIT: 1 Visit                    Kate ORN, PT, DPT Secure Chat Preferred  Rehab Office [phone]    Kate BRAVO [name] 02/17/2024, 1:41 PM

## 2024-02-17 NOTE — Progress Notes (Signed)
 Mobility Specialist Progress Note;    02/17/24 1043  Mobility  Activity Ambulated with assistance  Level of Assistance Contact guard assist, steadying assist  Assistive Device Cane  Distance Ambulated (ft) 500 ft  Activity Response Tolerated well  Mobility Referral Yes  Mobility visit 1 Mobility  Mobility Specialist Start Time (ACUTE ONLY) 1043  Mobility Specialist Stop Time (ACUTE ONLY) 1053  Mobility Specialist Time Calculation (min) (ACUTE ONLY) 10 min   Patient received in bed, eager to participate in mobility. No physical assistance required, CGA for safety. Pt had slight wobble with steps however, ambulated with steady gait. Returned to room, no c/o throughout. Pt Was left at EOB with PT. Call bell and personal belongings in reach. All needs met.   Florine Oak Mobility Specialist Please Neurosurgeon or [employer] [phone]

## 2024-02-17 NOTE — Plan of Care (Signed)
  Problem: Activity: Goal: Risk for activity intolerance will decrease Outcome: Progressing   Problem: Nutrition: Goal: Adequate nutrition will be maintained Outcome: Progressing   Problem: Coping: Goal: Level of anxiety will decrease Outcome: Progressing   Problem: Elimination: Goal: Will not experience complications related to bowel motility Outcome: Progressing   Problem: Elimination: Goal: Will not experience complications related to urinary retention Outcome: Progressing   Problem: Safety: Goal: Ability to remain free from injury will improve Outcome: Progressing   Problem: Skin Integrity: Goal: Risk for impaired skin integrity will decrease Outcome: Progressing

## 2024-02-17 NOTE — Plan of Care (Signed)

## 2024-02-17 NOTE — Progress Notes (Addendum)
" ° °  °  Subjective: NAEON. Hematuria lighter today. Reviewed full case and plan. Jordan Oconnell was pleasant as usual and expressed understanding.   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  Discontinue today.  Discussed trialing short course at the outset.  Would not want to continue this long-term with active cancer.  Some improvement initially with interval worsening the following day and then improvement again today.   7d ceftriaxone  today. Unlikely to be hemorrhagic UTI.  Some clot material in the shape of ureteral cast.  CT A/P again identifies 3 cm renal mass representing known carcinoma, presumed source of bleeding.   Trend H&H.  Transfuse < 7.0. improved Hgb for last 4 checks.  Bleeding significantly better this morning but hemoglobin inexplicably down.  Limited options for treatment.  Will continue holding Eliquis  if medically reasonable.   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 He has few options, but there were some considerations.  Initially recommended patient to follow-up with his oncologist at [facility].  He again is requested to transition his care locally to [facility].  I have encouraged him to reach out to his oncologist for a local referral.  Since his bleeding is not low enough for discharge, would recommend consideration by oncology while inpatient.  No surgical urologic interventions at this time.  Intake/Output from previous day: 01/22 0701 - 01/23 0700 In: 240 [P.O.:240] Out: 900 [Urine:900]  Intake/Output this shift: Total I/O In:  -  Out: 500 [Urine:500]  Physical Exam:  General: sleeping CV: No cyanosis Lungs: equal chest rise Gu: RLQ ileal conduit/urostomy draining clear light red blood-tinged urine.  Lab Results: Recent Labs    02/15/24 0437 02/16/24 0359 02/17/24 0513  HGB 9.3* 9.5* 8.4*  HCT 29.1* 30.6* 26.4*   BMET Recent Labs    02/16/24 0359 02/17/24 0513  NA 141 139  K 4.5 4.2  CL 110 110  CO2 19* 19*  GLUCOSE 91 96  BUN 51* 53*  CREATININE 2.68* 2.91*  CALCIUM  8.9 8.8*  HGB 9.5* 8.4*  WBC 4.5 3.9*     Studies/Results: No results found.    LOS: 7 days   Jordan Bourdon, NP [facility] Pager: [phone]  02/17/2024, 1:09 PM   "

## 2024-02-17 NOTE — Progress Notes (Signed)
 " PROGRESS NOTE  Jordan Oconnell  FMW:[id number] DOB: February 08, 1940 DOA: 02/09/2024 PCP: Shepard Ade, MD   Brief Narrative: Patient is a 84 year old male with history of hypertension, hyperlipidemia, GERD, hypothyroidism, secondary , solitary kidney, A-fib/tachybradycardia syndrome/status post placement replacement, aortic stenosis status post TAVR, chronic diastolic CHF, depression, anemia, bladder cancer status post cystectomy/urostomy, left total nephrectomy, right partial nephrectomy presented as a direct admission from from cardiology office after he presented with fatigue, reported blood in urine in nephrostomy bag.  On presentation, he was found to be anemic, lab work showed AKI.  Urology, cardiology consulted.  Continues to have hematuria, hemoglobin stable today.  Planning to keep him inpatient until hemoglobin is stable and hematuria resolves.  Urology closely following  Assessment & Plan:  Principal Problem:   Acute blood loss anemia Active Problems:   Essential hypertension   Bladder cancer (HCC)   PAF (paroxysmal atrial fibrillation) (HCC)   CKD (chronic kidney disease) stage 4, GFR 15-29 ml/min (HCC)   Severe aortic stenosis   S/P TAVR (transcatheter aortic valve replacement)   Pacemaker   Tachycardia-bradycardia syndrome (HCC)   Chronic combined systolic and diastolic CHF (congestive heart failure) (HCC)   Hypothyroidism   Gastro-esophageal reflux disease without esophagitis   Hyperlipidemia   Major depression, single episode   Presence of urostomy (HCC)   Heart block AV complete (HCC)   Coronary artery disease involving native coronary artery of native heart without angina pectoris   Hypothyroidism due to amiodarone   Acute blood loss anemia secondary to hematuria: Presented with gross hematuria.  Takes Eliquis .  On presentation, hemoglobin was 5.5.  Given 4 units of PRBCs so far.  Hemoglobin slightly  drifted down to the range of 8.4 today  Gross hematuria/history of  bladder cancer/urothelial cancer/RCC : Has complicated urological history.  Status post cystectomy, ureteral stone.  Also status post partial right nephrectomy, total nephrectomy.  Presented with gross hematuria.  Eliquis , aspirin  on hold.  Follows with [facility] urology/oncology.  Urology following here.  CT imaging showed concern for hepatic metastasis, also showed new retroperitoneal and right inguinal lymphadenopathy concerning for metastatic disease, mass in the right pelvic collecting system.  Urology contacted with his oncologist at [facility]. Started on tranexamic acid .  Finished five days course of  ceftriaxone .  Urine in the bag still looks pink/red   AKI on CKD stage IV: Baseline creatinine from 2.5-3.  Creatinine slightly trending up again.  Will start on gentle IV fluids today  Hypertension: On amlodipine , carvedilol , nitrates  Paroxysmal A-fib/A-flutter/history of tachybradycardia syndrome status post pacemaker placement: Cardiology was consulted.  Rate currently stable.  Eliquis  on hold.  Needs to be held for foreseeable future.  Has increased risk for stroke.  Cardiology signed out  History of severe aortic stenosis: Status post TAVR.  Follows with cardiology  Chronic diastolic CHF/CAD: Echo has shown normal left ventricular ejection fraction.  Had episodes of NSVT, currently asymptomatic..  Monitor on telemetry.  Continue coreg   Hypothyroidism :Continue levothyroxine   Cholelithiasis:Incidentally seen on CT.  Asymptomatic.  Also has abdominal hernia  Deconditioning/debility: Patient lives alone.  Still driving.  Does not have family.  PT recommended outpatient follow-up.        DVT prophylaxis:SCDs Start: 02/09/24 1628     Code Status: Full Code  Family Communication: None at bedside  Patient status:Inpatient  Patient is from :home  Anticipated discharge un:ynfz  Estimated DC date: After urology clearance, 1 to 2 days.  Still has red urine in the bag, hemoglobin drifted  down  today.  Not ready for discharge   Consultants: Urology, cardiology  Procedures: None yet  Antimicrobials:  Anti-infectives (From admission, onward)    Start     Dose/Rate Route Frequency Ordered Stop   02/11/24 1130  cefTRIAXone  (ROCEPHIN ) 1 g in sodium chloride  0.9 % 100 mL IVPB        1 g 200 mL/hr over 30 Minutes Intravenous Every 24 hours 02/11/24 1031 02/16/24 9077       Subjective: Patient seen and examined at bedside today.  Lying in bed.  Very comfortable.  No new complaints.  Urine bag still containing red/pink urine.  We discussed about monitoring him next 1 to 2 days to make sure his hemoglobin is stable before discharge.  Objective: Vitals:   02/17/24 0433 02/17/24 0700 02/17/24 0732 02/17/24 0800  BP: 109/76  (!) 167/80   Pulse: 72 70  77  Resp: 18  18   Temp: 97.9 F (36.6 C)  97.8 F (36.6 C)   TempSrc: Oral  Oral   SpO2: 95% 100%  97%  Weight:      Height:        Intake/Output Summary (Last 24 hours) at 02/17/2024 1107 Last data filed at 02/17/2024 9147 Gross per 24 hour  Intake 240 ml  Output 1400 ml  Net -1160 ml   Filed Weights   02/10/24 0730  Weight: 84.1 kg    Examination:  General exam: Overall comfortable, not in distress, pleasant elderly gentleman HEENT: PERRL Respiratory system:  no wheezes or crackles  Cardiovascular system: Paced rhythm.  Gastrointestinal system: Abdomen is nondistended, soft and nontender.  Urostomy, red urine in the bag Central nervous system: Alert and oriented Extremities: No edema, no clubbing ,no cyanosis Skin: No rashes, no ulcers,no icterus     Data Reviewed: I have personally reviewed following labs and imaging studies  CBC: Recent Labs  Lab 02/11/24 0421 02/11/24 1113 02/12/24 0323 02/12/24 1654 02/13/24 0602 02/13/24 1410 02/14/24 0437 02/14/24 1410 02/15/24 0437 02/16/24 0359 02/17/24 0513  WBC 4.0  --  4.0  --  4.1  --  4.0  --  3.3* 4.5 3.9*  NEUTROABS 2.5  --  2.5  --  2.8  --  2.6   --  2.0  --   --   HGB 8.5*   < > 7.8*   < > 9.0*   < > 8.8* 9.1* 9.3* 9.5* 8.4*  HCT 26.0*   < > 23.7*   < > 28.1*   < > 27.6* 29.2* 29.1* 30.6* 26.4*  MCV 99.2  --  100.4*  --  96.9  --  98.9  --  98.0 98.4 96.0  PLT 86*  --  78*  --  80*  --  79*  --  80* 86* 84*   < > = values in this interval not displayed.   Basic Metabolic Panel: Recent Labs  Lab 02/11/24 0730 02/12/24 0323 02/13/24 0602 02/14/24 0437 02/15/24 0437 02/16/24 0359 02/17/24 0513  NA  --    < > 139 141 140 141 139  K  --    < > 4.7 4.6 4.5 4.5 4.2  CL  --    < > 111 112* 109 110 110  CO2  --    < > 18* 19* 21* 19* 19*  GLUCOSE  --    < > 96 91 91 91 96  BUN  --    < > 52* 53* 49* 51* 53*  CREATININE  --    < > 3.04* 2.75* 2.45* 2.68* 2.91*  CALCIUM   --    < > 8.7* 8.8* 8.8* 8.9 8.8*  MG 2.1  --   --   --   --   --  2.1   < > = values in this interval not displayed.     No results found for this or any previous visit (from the past 240 hours).   Radiology Studies: No results found.  Scheduled Meds:  sodium chloride    Intravenous Once   allopurinol   100 mg Oral Daily   ALPRAZolam   0.25 mg Oral QHS   amiodarone   200 mg Oral Daily   amLODipine   5 mg Oral Daily   atorvastatin   10 mg Oral QHS   carvedilol   12.5 mg Oral BID PC   feeding supplement  237 mL Oral BID BM   isosorbide  mononitrate  30 mg Oral Daily   levothyroxine   200 mcg Oral QAC breakfast   mirtazapine   7.5 mg Oral QHS   sodium bicarbonate   1,300 mg Oral BID   sodium chloride  flush  3 mL Intravenous Q12H   tranexamic acid   1,300 mg Oral BID   Continuous Infusions:     LOS: 7 days   Ivonne Mustache, MD [facility] P1/23/2026, 11:07 AM  "

## 2024-02-17 NOTE — Progress Notes (Signed)
 Patient noted on telemetry to have a 20-beat run of VTach. Pt assessed immediately and remained asymptomatic; denies chest pain, shortness of breath, dizziness, or palpitations. Vital signs stable. Dr. Franky notified. New orders received for stat BMET and Magnesium  level. Pt continues to be monitored on telemetry.

## 2024-02-18 DIAGNOSIS — D62 Acute posthemorrhagic anemia: Secondary | ICD-10-CM | POA: Diagnosis not present

## 2024-02-18 LAB — CBC
HCT: 27.1 % — ABNORMAL LOW (ref 39.0–52.0)
Hemoglobin: 8.5 g/dL — ABNORMAL LOW (ref 13.0–17.0)
MCH: 30.5 pg (ref 26.0–34.0)
MCHC: 31.4 g/dL (ref 30.0–36.0)
MCV: 97.1 fL (ref 80.0–100.0)
Platelets: 86 10*3/uL — ABNORMAL LOW (ref 150–400)
RBC: 2.79 MIL/uL — ABNORMAL LOW (ref 4.22–5.81)
RDW: 16.5 % — ABNORMAL HIGH (ref 11.5–15.5)
WBC: 3.4 10*3/uL — ABNORMAL LOW (ref 4.0–10.5)
nRBC: 0 % (ref 0.0–0.2)

## 2024-02-18 LAB — BASIC METABOLIC PANEL WITH GFR
Anion gap: 9 (ref 5–15)
BUN: 50 mg/dL — ABNORMAL HIGH (ref 8–23)
CO2: 19 mmol/L — ABNORMAL LOW (ref 22–32)
Calcium: 8.6 mg/dL — ABNORMAL LOW (ref 8.9–10.3)
Chloride: 112 mmol/L — ABNORMAL HIGH (ref 98–111)
Creatinine, Ser: 2.66 mg/dL — ABNORMAL HIGH (ref 0.61–1.24)
GFR, Estimated: 23 mL/min — ABNORMAL LOW
Glucose, Bld: 96 mg/dL (ref 70–99)
Potassium: 4.5 mmol/L (ref 3.5–5.1)
Sodium: 140 mmol/L (ref 135–145)

## 2024-02-18 NOTE — Progress Notes (Signed)
 Mobility Specialist Progress Note:   02/18/24 1400  Mobility  Activity Ambulated with assistance  Level of Assistance Contact guard assist, steadying assist  Assistive Device Cane  Distance Ambulated (ft) 150 ft (x2)  Activity Response Tolerated well  Mobility Referral Yes  Mobility visit 1 Mobility  Mobility Specialist Start Time (ACUTE ONLY) 1340  Mobility Specialist Stop Time (ACUTE ONLY) 1425  Mobility Specialist Time Calculation (min) (ACUTE ONLY) 45 min   Pt received in bed agreeable to second walk. No physical assistance required. Distance limited d/t multiple breaks to the BR during session d/t bowel urgency. Patient became very fatigue. Returned to bed w/o fault. Call bell and personal belongings in reach. All needs met. NT and RN aware.  Thersia Minder Mobility Specialist  Please contact vis Secure Chat or  Rehab Office [phone]

## 2024-02-18 NOTE — Progress Notes (Signed)
 " PROGRESS NOTE  Jordan Oconnell  FMW:[id number] DOB: [birth year] DOA: 02/09/2024 PCP: Shepard Ade, MD   Brief Narrative: Patient is a 84 year old male with history of hypertension, hyperlipidemia, GERD, hypothyroidism, secondary , solitary kidney, A-fib/tachybradycardia syndrome/status post placement replacement, aortic stenosis status post TAVR, chronic diastolic CHF, depression, anemia, bladder cancer status post cystectomy/urostomy, left total nephrectomy, right partial nephrectomy presented as a direct admission from from cardiology office after he presented with fatigue, reported blood in urine in nephrostomy bag.  On presentation, he was found to be anemic, lab work showed AKI.  Urology, cardiology consulted.  Continues to have hematuria, hemoglobin stable today.  Planning to keep him inpatient until hemoglobin is stable and hematuria resolves.  Urology closely following  Assessment & Plan:  Principal Problem:   Acute blood loss anemia Active Problems:   Essential hypertension   Bladder cancer (HCC)   PAF (paroxysmal atrial fibrillation) (HCC)   CKD (chronic kidney disease) stage 4, GFR 15-29 ml/min (HCC)   Severe aortic stenosis   S/P TAVR (transcatheter aortic valve replacement)   Pacemaker   Tachycardia-bradycardia syndrome (HCC)   Chronic combined systolic and diastolic CHF (congestive heart failure) (HCC)   Hypothyroidism   Gastro-esophageal reflux disease without esophagitis   Hyperlipidemia   Major depression, single episode   Presence of urostomy (HCC)   Heart block AV complete (HCC)   Coronary artery disease involving native coronary artery of native heart without angina pectoris   Hypothyroidism due to amiodarone   Acute blood loss anemia secondary to hematuria: Presented with gross hematuria.  Takes Eliquis .  On presentation, hemoglobin was 5.5.  Given 4 units of PRBCs so far.  Hemoglobin slightly  drifted down to the range of 8.5 today  Gross hematuria/history of  bladder cancer/urothelial cancer/RCC : Has complicated urological history.  Status post cystectomy, ureteral stone.  Also status post partial right nephrectomy, total nephrectomy.  Presented with gross hematuria.  Eliquis , aspirin  on hold.  Follows with [facility] urology/oncology: Dr Raynaldo.  Urology following here.  CT imaging showed concern for hepatic metastasis, also showed new retroperitoneal and right inguinal lymphadenopathy concerning for metastatic disease, mass in the right pelvic collecting system.  Urology contacted with his oncologist at [facility]. Started on tranexamic acid .  Finished five days course of  ceftriaxone .  Urine in the bag still looks pink/red   AKI on CKD stage IV: Baseline creatinine from 2.5-3.  Creatinine slightly trending up again.Started  on gentle IV fluids today, improving  Hypertension: On amlodipine , carvedilol , nitrates  Paroxysmal A-fib/A-flutter/history of tachybradycardia syndrome status post pacemaker placement: Cardiology was consulted.  Rate currently stable.  Eliquis  on hold.  Needs to be held for foreseeable future.  Has increased risk for stroke.  Cardiology signed out  History of severe aortic stenosis: Status post TAVR.  Follows with cardiology  Chronic diastolic CHF/CAD: Echo has shown normal left ventricular ejection fraction.  Had episodes of NSVT, currently asymptomatic..  Monitor on telemetry.  Continue coreg   Hypothyroidism :Continue levothyroxine   Cholelithiasis:Incidentally seen on CT.  Asymptomatic.  Also has abdominal hernia  Deconditioning/debility: Patient lives alone.  Still driving.  Does not have family.  PT recommended outpatient follow-up.        DVT prophylaxis:SCDs Start: 02/09/24 1628     Code Status: Full Code  Family Communication: None at bedside  Patient status:Inpatient  Patient is from :home  Anticipated discharge un:ynfz  Estimated DC date: After urology clearance, 1 to 2 days.  Still has red urine in the bag,  hemoglobin low,  Not ready for discharge   Consultants: Urology, cardiology  Procedures: None yet  Antimicrobials:  Anti-infectives (From admission, onward)    Start     Dose/Rate Route Frequency Ordered Stop   02/11/24 1130  cefTRIAXone  (ROCEPHIN ) 1 g in sodium chloride  0.9 % 100 mL IVPB        1 g 200 mL/hr over 30 Minutes Intravenous Every 24 hours 02/11/24 1031 02/16/24 9077       Subjective: Patient seen and examined at bedside today.  Hemodynamically stable.  Comfortable.  Urine in the Foley bag still red/pink.  Hemoglobin in the range of 8.5.  We discussed about continuing to keep him here until urine color is better and hemoglobin is stable  Objective: Vitals:   02/17/24 2051 02/18/24 0005 02/18/24 0500 02/18/24 0800  BP: 123/61 (!) 142/65 119/80 (!) 155/77  Pulse:  66 72   Resp: 18 18 18    Temp: 97.7 F (36.5 C) 97.9 F (36.6 C) 97.6 F (36.4 C) 97.9 F (36.6 C)  TempSrc: Oral Oral Oral Oral  SpO2: 95% 95% 95% 97%  Weight:      Height:        Intake/Output Summary (Last 24 hours) at 02/18/2024 1100 Last data filed at 02/18/2024 0840 Gross per 24 hour  Intake 2468.14 ml  Output 1325 ml  Net 1143.14 ml   Filed Weights   02/10/24 0730  Weight: 84.1 kg    Examination:   General exam: Overall comfortable, not in distress, very pleasant gentleman HEENT: PERRL Respiratory system:  no wheezes or crackles  Cardiovascular system: Paced rhythm Gastrointestinal system: Abdomen is nondistended, soft and nontender. Red urine in the bag Central nervous system: Alert and oriented Extremities: No edema, no clubbing ,no cyanosis Skin: No rashes, no ulcers,no icterus     Data Reviewed: I have personally reviewed following labs and imaging studies  CBC: Recent Labs  Lab 02/12/24 0323 02/12/24 1654 02/13/24 0602 02/13/24 1410 02/14/24 0437 02/14/24 1410 02/15/24 0437 02/16/24 0359 02/17/24 0513 02/18/24 0457  WBC 4.0  --  4.1  --  4.0  --  3.3* 4.5 3.9*  3.4*  NEUTROABS 2.5  --  2.8  --  2.6  --  2.0  --   --   --   HGB 7.8*   < > 9.0*   < > 8.8* 9.1* 9.3* 9.5* 8.4* 8.5*  HCT 23.7*   < > 28.1*   < > 27.6* 29.2* 29.1* 30.6* 26.4* 27.1*  MCV 100.4*  --  96.9  --  98.9  --  98.0 98.4 96.0 97.1  PLT 78*  --  80*  --  79*  --  80* 86* 84* 86*   < > = values in this interval not displayed.   Basic Metabolic Panel: Recent Labs  Lab 02/14/24 0437 02/15/24 0437 02/16/24 0359 02/17/24 0513 02/18/24 0457  NA 141 140 141 139 140  K 4.6 4.5 4.5 4.2 4.5  CL 112* 109 110 110 112*  CO2 19* 21* 19* 19* 19*  GLUCOSE 91 91 91 96 96  BUN 53* 49* 51* 53* 50*  CREATININE 2.75* 2.45* 2.68* 2.91* 2.66*  CALCIUM  8.8* 8.8* 8.9 8.8* 8.6*  MG  --   --   --  2.1  --      No results found for this or any previous visit (from the past 240 hours).   Radiology Studies: No results found.  Scheduled Meds:  sodium chloride    Intravenous  Once   allopurinol   100 mg Oral Daily   ALPRAZolam   0.25 mg Oral QHS   amiodarone   200 mg Oral Daily   amLODipine   5 mg Oral Daily   atorvastatin   10 mg Oral QHS   carvedilol   12.5 mg Oral BID PC   feeding supplement  237 mL Oral BID BM   isosorbide  mononitrate  30 mg Oral Daily   levothyroxine   200 mcg Oral QAC breakfast   mirtazapine   7.5 mg Oral QHS   sodium bicarbonate   1,300 mg Oral BID   sodium chloride  flush  3 mL Intravenous Q12H   Continuous Infusions:  sodium chloride  75 mL/hr at 02/18/24 0655      LOS: 8 days   Ivonne Mustache, MD [facility] P1/24/2026, 11:00 AM  "

## 2024-02-18 NOTE — Progress Notes (Signed)
 Mobility Specialist Progress Note:    02/18/24 1200  Mobility  Activity Ambulated with assistance  Level of Assistance Contact guard assist, steadying assist  Assistive Device Cane  Distance Ambulated (ft) 500 ft  Activity Response Tolerated well  Mobility Referral Yes  Mobility visit 1 Mobility  Mobility Specialist Start Time (ACUTE ONLY) 1025  Mobility Specialist Stop Time (ACUTE ONLY) 1038  Mobility Specialist Time Calculation (min) (ACUTE ONLY) 13 min   Received pt in bed having no complaints and agreeable to mobility. Pt was asymptomatic throughout ambulation and returned to room w/o fault. Left in bed w/ call bell in reach and all needs met.'  Thersia Minder Mobility Specialist  Please contact vis Secure Chat or  Rehab Office [phone]

## 2024-02-18 NOTE — Plan of Care (Signed)

## 2024-02-18 NOTE — Progress Notes (Signed)
 S: Jordan Oconnell is without the complaint.  He is encouraged by his urine color.  He spoke to his oncologist and has follow-up in 10 days.  O: No acute distress, alert and oriented, eating breakfast and watching TV Ostomy is pink and viable, urine grade 1-2.  Clear to light pink.  1/23 hemoglobin 8.4, 1/24 hemoglobin 8.5.  Creatinine 2.6.  A/P: Presumed bleeding from right upper tract disease-reviewed Jordan Oconnell 07/26/2023 note on care everywhere-excellent summary of care.  He has follow-up with Jordan Oconnell in about 10 days. Stable today.  Really appreciate excellent hospitalist care.  Please page GU with any questions, concerns or changes in patient's status.

## 2024-02-18 NOTE — Plan of Care (Signed)

## 2024-02-19 ENCOUNTER — Other Ambulatory Visit (HOSPITAL_COMMUNITY): Payer: Self-pay

## 2024-02-19 DIAGNOSIS — D62 Acute posthemorrhagic anemia: Secondary | ICD-10-CM | POA: Diagnosis not present

## 2024-02-19 LAB — BASIC METABOLIC PANEL WITH GFR
Anion gap: 9 (ref 5–15)
BUN: 48 mg/dL — ABNORMAL HIGH (ref 8–23)
CO2: 18 mmol/L — ABNORMAL LOW (ref 22–32)
Calcium: 8.3 mg/dL — ABNORMAL LOW (ref 8.9–10.3)
Chloride: 113 mmol/L — ABNORMAL HIGH (ref 98–111)
Creatinine, Ser: 2.5 mg/dL — ABNORMAL HIGH (ref 0.61–1.24)
GFR, Estimated: 25 mL/min — ABNORMAL LOW
Glucose, Bld: 88 mg/dL (ref 70–99)
Potassium: 4.3 mmol/L (ref 3.5–5.1)
Sodium: 141 mmol/L (ref 135–145)

## 2024-02-19 LAB — CBC
HCT: 27.7 % — ABNORMAL LOW (ref 39.0–52.0)
Hemoglobin: 8.8 g/dL — ABNORMAL LOW (ref 13.0–17.0)
MCH: 30.8 pg (ref 26.0–34.0)
MCHC: 31.8 g/dL (ref 30.0–36.0)
MCV: 96.9 fL (ref 80.0–100.0)
Platelets: 98 10*3/uL — ABNORMAL LOW (ref 150–400)
RBC: 2.86 MIL/uL — ABNORMAL LOW (ref 4.22–5.81)
RDW: 16.2 % — ABNORMAL HIGH (ref 11.5–15.5)
WBC: 3.3 10*3/uL — ABNORMAL LOW (ref 4.0–10.5)
nRBC: 0 % (ref 0.0–0.2)

## 2024-02-19 NOTE — Plan of Care (Signed)
" °  Problem: Education: Goal: Knowledge of General Education information will improve Description: Including pain rating scale, medication(s)/side effects and non-pharmacologic comfort measures Outcome: Completed/Met   Problem: Health Behavior/Discharge Planning: Goal: Ability to manage health-related needs will improve Outcome: Completed/Met   Problem: Clinical Measurements: Goal: Will remain free from infection Outcome: Progressing Goal: Diagnostic test results will improve Outcome: Progressing Goal: Cardiovascular complication will be avoided Outcome: Progressing   Problem: Nutrition: Goal: Adequate nutrition will be maintained Outcome: Progressing   Problem: Elimination: Goal: Will not experience complications related to bowel motility Outcome: Progressing Goal: Will not experience complications related to urinary retention Outcome: Progressing   Problem: Pain Managment: Goal: General experience of comfort will improve and/or be controlled Outcome: Completed/Met   Problem: Safety: Goal: Ability to remain free from injury will improve Outcome: Progressing   Problem: Skin Integrity: Goal: Risk for impaired skin integrity will decrease Outcome: Completed/Met   "

## 2024-02-19 NOTE — Progress Notes (Signed)
 " PROGRESS NOTE  Jordan Oconnell  FMW:[id number] DOB: 03/20/1940 DOA: 02/09/2024 PCP: Shepard Ade, MD   Brief Narrative: Patient is a 84 year old male with history of hypertension, hyperlipidemia, GERD, hypothyroidism, secondary , solitary kidney, A-fib/tachybradycardia syndrome/status post placement replacement, aortic stenosis status post TAVR, chronic diastolic CHF, depression, anemia, bladder cancer status post cystectomy/urostomy, left total nephrectomy, right partial nephrectomy presented as a direct admission from from cardiology office after he presented with fatigue, reported blood in urine in nephrostomy bag.  On presentation, he was found to be anemic, lab work showed AKI.  Urology, cardiology consulted.  Continues to have hematuria, hemoglobin stable today.  Planning to keep him inpatient until hemoglobin is stable and hematuria resolves.  Urology closely following.  Assessment & Plan:  Principal Problem:   Acute blood loss anemia Active Problems:   Essential hypertension   Bladder cancer (HCC)   PAF (paroxysmal atrial fibrillation) (HCC)   CKD (chronic kidney disease) stage 4, GFR 15-29 ml/min (HCC)   Severe aortic stenosis   S/P TAVR (transcatheter aortic valve replacement)   Pacemaker   Tachycardia-bradycardia syndrome (HCC)   Chronic combined systolic and diastolic CHF (congestive heart failure) (HCC)   Hypothyroidism   Gastro-esophageal reflux disease without esophagitis   Hyperlipidemia   Major depression, single episode   Presence of urostomy (HCC)   Heart block AV complete (HCC)   Coronary artery disease involving native coronary artery of native heart without angina pectoris   Hypothyroidism due to amiodarone   Acute blood loss anemia secondary to hematuria: Presented with gross hematuria.  Takes Eliquis .  On presentation, hemoglobin was 5.5.  Given 4 units of PRBCs so far.  Hemoglobin stable in the range of 8.8 today  Gross hematuria/history of bladder  cancer/urothelial cancer/RCC : Has complicated urological history.  Status post cystectomy, ureteral stone.  Also status post partial right nephrectomy, total nephrectomy.  Presented with gross hematuria.  Eliquis , aspirin  on hold.  Follows with [facility] urology/oncology: Dr Raynaldo.  Urology following here.  CT imaging showed concern for hepatic metastasis, also showed new retroperitoneal and right inguinal lymphadenopathy concerning for metastatic disease, mass in the right pelvic collecting system.  Urology contacted with his oncologist at [facility]. Started on tranexamic acid .  Finished five days course of  ceftriaxone .  Urine in the bag  look less reddish today  AKI on CKD stage IV: Baseline creatinine from 2.5-3.  Normal kidney.  Close to baseline.  Will continue gentle IV fluids  Hypertension: On amlodipine , carvedilol , nitrates  Paroxysmal A-fib/A-flutter/history of tachybradycardia syndrome status post pacemaker placement: Cardiology was consulted.  Rate currently stable.  Eliquis  on hold.  Needs to be held for foreseeable future.  Has increased risk for stroke.  Cardiology signed out  History of severe aortic stenosis: Status post TAVR.  Follows with cardiology  Chronic diastolic CHF/CAD: Echo has shown normal left ventricular ejection fraction.  Had episodes of NSVT, currently asymptomatic..  Monitor on telemetry.  Continue coreg   Hypothyroidism :Continue levothyroxine   Cholelithiasis:Incidentally seen on CT.  Asymptomatic.  Also has abdominal hernia  Deconditioning/debility: Patient lives alone.  Still driving.  Does not have family.  PT recommended outpatient follow-up.        DVT prophylaxis:SCDs Start: 02/09/24 1628     Code Status: Full Code  Family Communication: None at bedside  Patient status:Inpatient  Patient is from :home  Anticipated discharge un:ynfz  Estimated DC date: Likely tomorrow if hematuria continues to improve and hemoglobin remained stable  Consultants:  Urology, cardiology  Procedures:  None yet  Antimicrobials:  Anti-infectives (From admission, onward)    Start     Dose/Rate Route Frequency Ordered Stop   02/11/24 1130  cefTRIAXone  (ROCEPHIN ) 1 g in sodium chloride  0.9 % 100 mL IVPB        1 g 200 mL/hr over 30 Minutes Intravenous Every 24 hours 02/11/24 1031 02/16/24 9077       Subjective: Patient seen and examined at bedside today.  No new complaints.  Urine in the bag looks less reddish.  Objective: Vitals:   02/19/24 0059 02/19/24 0100 02/19/24 0450 02/19/24 0814  BP: 135/67  (!) 157/74 (!) 166/80  Pulse:   72 73  Resp:  18 18 18   Temp:  98.6 F (37 C) 97.8 F (36.6 C) 98 F (36.7 C)  TempSrc:  Oral Oral Oral  SpO2:   96% 98%  Weight:      Height:        Intake/Output Summary (Last 24 hours) at 02/19/2024 1107 Last data filed at 02/19/2024 0850 Gross per 24 hour  Intake 960 ml  Output 2550 ml  Net -1590 ml   Filed Weights   02/10/24 0730  Weight: 84.1 kg    Examination:   General exam: Overall comfortable, not in distress HEENT: PERRL Respiratory system:  no wheezes or crackles  Cardiovascular system: S1 & S2 heard, RRR.  Gastrointestinal system: Abdomen is nondistended, soft and nontender.  Urostomy Central nervous system: Alert and oriented Extremities: No edema, no clubbing ,no cyanosis Skin: No rashes, no ulcers,no icterus   GU: Pinkish bag in the urine   Data Reviewed: I have personally reviewed following labs and imaging studies  CBC: Recent Labs  Lab 02/13/24 0602 02/13/24 1410 02/14/24 0437 02/14/24 1410 02/15/24 0437 02/16/24 0359 02/17/24 0513 02/18/24 0457 02/19/24 0319  WBC 4.1  --  4.0  --  3.3* 4.5 3.9* 3.4* 3.3*  NEUTROABS 2.8  --  2.6  --  2.0  --   --   --   --   HGB 9.0*   < > 8.8*   < > 9.3* 9.5* 8.4* 8.5* 8.8*  HCT 28.1*   < > 27.6*   < > 29.1* 30.6* 26.4* 27.1* 27.7*  MCV 96.9  --  98.9  --  98.0 98.4 96.0 97.1 96.9  PLT 80*  --  79*  --  80* 86* 84* 86* 98*   < >  = values in this interval not displayed.   Basic Metabolic Panel: Recent Labs  Lab 02/15/24 0437 02/16/24 0359 02/17/24 0513 02/18/24 0457 02/19/24 0319  NA 140 141 139 140 141  K 4.5 4.5 4.2 4.5 4.3  CL 109 110 110 112* 113*  CO2 21* 19* 19* 19* 18*  GLUCOSE 91 91 96 96 88  BUN 49* 51* 53* 50* 48*  CREATININE 2.45* 2.68* 2.91* 2.66* 2.50*  CALCIUM  8.8* 8.9 8.8* 8.6* 8.3*  MG  --   --  2.1  --   --      No results found for this or any previous visit (from the past 240 hours).   Radiology Studies: No results found.  Scheduled Meds:  sodium chloride    Intravenous Once   allopurinol   100 mg Oral Daily   ALPRAZolam   0.25 mg Oral QHS   amiodarone   200 mg Oral Daily   amLODipine   5 mg Oral Daily   atorvastatin   10 mg Oral QHS   carvedilol   12.5 mg Oral BID PC   feeding  supplement  237 mL Oral BID BM   isosorbide  mononitrate  30 mg Oral Daily   levothyroxine   200 mcg Oral QAC breakfast   mirtazapine   7.5 mg Oral QHS   sodium bicarbonate   1,300 mg Oral BID   sodium chloride  flush  3 mL Intravenous Q12H   Continuous Infusions:  sodium chloride  75 mL/hr at 02/19/24 0448      LOS: 9 days   Ivonne Mustache, MD [facility] P1/25/2026, 11:07 AM  "

## 2024-02-20 DIAGNOSIS — D62 Acute posthemorrhagic anemia: Secondary | ICD-10-CM | POA: Diagnosis not present

## 2024-02-20 LAB — BASIC METABOLIC PANEL WITH GFR
Anion gap: 10 (ref 5–15)
BUN: 43 mg/dL — ABNORMAL HIGH (ref 8–23)
CO2: 18 mmol/L — ABNORMAL LOW (ref 22–32)
Calcium: 8.3 mg/dL — ABNORMAL LOW (ref 8.9–10.3)
Chloride: 112 mmol/L — ABNORMAL HIGH (ref 98–111)
Creatinine, Ser: 2.48 mg/dL — ABNORMAL HIGH (ref 0.61–1.24)
GFR, Estimated: 25 mL/min — ABNORMAL LOW
Glucose, Bld: 83 mg/dL (ref 70–99)
Potassium: 4.3 mmol/L (ref 3.5–5.1)
Sodium: 141 mmol/L (ref 135–145)

## 2024-02-20 LAB — CBC
HCT: 27.1 % — ABNORMAL LOW (ref 39.0–52.0)
Hemoglobin: 8.4 g/dL — ABNORMAL LOW (ref 13.0–17.0)
MCH: 29.8 pg (ref 26.0–34.0)
MCHC: 31 g/dL (ref 30.0–36.0)
MCV: 96.1 fL (ref 80.0–100.0)
Platelets: 104 10*3/uL — ABNORMAL LOW (ref 150–400)
RBC: 2.82 MIL/uL — ABNORMAL LOW (ref 4.22–5.81)
RDW: 16.1 % — ABNORMAL HIGH (ref 11.5–15.5)
WBC: 2.9 10*3/uL — ABNORMAL LOW (ref 4.0–10.5)
nRBC: 0 % (ref 0.0–0.2)

## 2024-02-20 MED ORDER — AMLODIPINE BESYLATE 5 MG PO TABS
5.0000 mg | ORAL_TABLET | Freq: Once | ORAL | Status: AC
  Administered 2024-02-20: 5 mg via ORAL
  Filled 2024-02-20: qty 1

## 2024-02-20 MED ORDER — AMLODIPINE BESYLATE 10 MG PO TABS
10.0000 mg | ORAL_TABLET | Freq: Every day | ORAL | Status: DC
  Administered 2024-02-21 – 2024-02-23 (×3): 10 mg via ORAL
  Filled 2024-02-20 (×3): qty 1

## 2024-02-20 NOTE — Plan of Care (Signed)
" °  Problem: Clinical Measurements: Goal: Ability to maintain clinical measurements within normal limits will improve Outcome: Adequate for Discharge Goal: Will remain free from infection Outcome: Completed/Met Goal: Diagnostic test results will improve Outcome: Adequate for Discharge Goal: Respiratory complications will improve Outcome: Adequate for Discharge Goal: Cardiovascular complication will be avoided Outcome: Adequate for Discharge   Problem: Activity: Goal: Risk for activity intolerance will decrease Outcome: Progressing   Problem: Nutrition: Goal: Adequate nutrition will be maintained Outcome: Completed/Met   Problem: Coping: Goal: Level of anxiety will decrease Outcome: Completed/Met   Problem: Elimination: Goal: Will not experience complications related to bowel motility Outcome: Adequate for Discharge Goal: Will not experience complications related to urinary retention Outcome: Completed/Met   Problem: Safety: Goal: Ability to remain free from injury will improve Outcome: Completed/Met   "

## 2024-02-20 NOTE — Progress Notes (Signed)
" ° °  °  Subjective: NAEON.  Urine light tan with old blood in tubing on rounds today.  Patient was in good spirits.  Reviewed case and plan again. 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   7d ceftriaxone  today. Unlikely to be hemorrhagic UTI.  Some clot material in the shape of ureteral cast.  CT A/P again identifies 3 cm renal mass representing known carcinoma, presumed source of bleeding.   Trend H&H.  Transfuse < 7.0. improved Hgb for last 4 checks.  Hgb stable for last 4 checks.  Comfortable discharging with close follow-up with his oncologist at [facility].  Continue holding Eliquis  if medically reasonable.   Nursing to change Foley bag  Intake/Output from previous day: 01/25 0701 - 01/26 0700 In: 4562.6 [P.O.:1440; I.V.:3122.6] Out: 2475 [Urine:2475]  Intake/Output this shift: Total I/O In: 1212.9 [P.O.:720; I.V.:492.9] Out: 1500 [Urine:1500]  Physical Exam:  General: sleeping CV: No cyanosis Lungs: equal chest rise Gu: RLQ ileal conduit/urostomy draining clear light tan urine.  Lab Results: Recent Labs    02/18/24 0457 02/19/24 0319 02/20/24 0251  HGB 8.5* 8.8* 8.4*  HCT 27.1* 27.7* 27.1*   BMET Recent Labs    02/19/24 0319 02/20/24 0251  NA 141 141  K 4.3 4.3  CL 113* 112*  CO2 18* 18*  GLUCOSE 88 83  BUN 48* 43*  CREATININE 2.50* 2.48*  CALCIUM  8.3* 8.3*  HGB 8.8* 8.4*  WBC 3.3* 2.9*     Studies/Results: No results found.    LOS: 10 days   Ole Bourdon, NP [facility] Pager: [phone]  02/20/2024, 6:05 PM   "

## 2024-02-20 NOTE — TOC Progression Note (Signed)
 Transition of Care [facility]) - Progression Note    Patient Details  Name: Jordan Oconnell MRN: [id number] Date of Birth: 1940/06/09  [facility]) CM/SW Contact  Graves-Bigelow, Erminio Deems, RN Phone Number: 02/20/2024, 12:19 PM  Clinical Narrative: Per MD notes, hematuria is slowly improving. ICM continues to follow for disposition needs as the patient progresses.     Expected Discharge Plan: Home/Self Care Barriers to Discharge: Continued Medical Work up  Expected Discharge Plan and Services   Discharge Planning Services: CM Consult   Living arrangements for the past 2 months: Single Family Home   Social Drivers of Health (SDOH) Interventions SDOH Screenings   Food Insecurity: No Food Insecurity (02/09/2024)  Housing: Low Risk (02/09/2024)  Transportation Needs: No Transportation Needs (02/09/2024)  Utilities: Not At Risk (02/09/2024)  Social Connections: Unknown (02/09/2024)  Tobacco Use: Low Risk (02/10/2024)    Readmission Risk Interventions    05/04/2023   10:12 AM 03/08/2023   12:36 PM  Readmission Risk Prevention Plan  Transportation Screening Complete Complete  PCP or Specialist Appt within 3-5 Days Complete Complete  HRI or Home Care Consult Complete Complete  Social Work Consult for Recovery Care Planning/Counseling Complete Complete  Palliative Care Screening Not Applicable Not Applicable  Medication Review Oceanographer) Complete Complete

## 2024-02-20 NOTE — Progress Notes (Signed)
 " PROGRESS NOTE  Jordan Oconnell  FMW:[id number] DOB: 05/26/1940 DOA: 02/09/2024 PCP: Shepard Ade, MD   Brief Narrative: Patient is a 84 year old male with history of hypertension, hyperlipidemia, GERD, hypothyroidism, secondary , solitary kidney, A-fib/tachybradycardia syndrome/status post placement replacement, aortic stenosis status post TAVR, chronic diastolic CHF, depression, anemia, bladder cancer status post cystectomy/urostomy, left total nephrectomy, right partial nephrectomy presented as a direct admission from from cardiology office after he presented with fatigue, reported blood in urine in nephrostomy bag.  On presentation, he was found to be anemic, lab work showed AKI.  Urology, cardiology consulted.   Hematuria slowly improving, hemoglobin has been stable in the range of 8.  Possible discharge to home tomorrow.   Assessment & Plan:  Principal Problem:   Acute blood loss anemia Active Problems:   Essential hypertension   Bladder cancer (HCC)   PAF (paroxysmal atrial fibrillation) (HCC)   CKD (chronic kidney disease) stage 4, GFR 15-29 ml/min (HCC)   Severe aortic stenosis   S/P TAVR (transcatheter aortic valve replacement)   Pacemaker   Tachycardia-bradycardia syndrome (HCC)   Chronic combined systolic and diastolic CHF (congestive heart failure) (HCC)   Hypothyroidism   Gastro-esophageal reflux disease without esophagitis   Hyperlipidemia   Major depression, single episode   Presence of urostomy (HCC)   Heart block AV complete (HCC)   Coronary artery disease involving native coronary artery of native heart without angina pectoris   Hypothyroidism due to amiodarone   Acute blood loss anemia secondary to hematuria: Presented with gross hematuria.  Takes Eliquis .  On presentation, hemoglobin was 5.5.  Given 4 units of PRBCs so far.  Hemoglobin stable in the range of 8 today  Gross hematuria/history of bladder cancer/urothelial cancer/RCC : Has complicated urological  history.  Status post cystectomy, ureteral stone.  Also status post partial right nephrectomy, total nephrectomy.  Presented with gross hematuria.  Eliquis , aspirin  on hold.  Follows with [facility] urology/oncology: Dr Raynaldo.  Urology following here.  CT imaging showed concern for hepatic metastasis, also showed new retroperitoneal and right inguinal lymphadenopathy concerning for metastatic disease, mass in the right pelvic collecting system.  Urology contacted with his oncologist at [facility]. Started on tranexamic acid .  Finished five days course of  ceftriaxone .  Urine in the bag continues to  look less reddish today  AKI on CKD stage IV: Baseline creatinine from 2.5-3.  Normal kidney.  Close to baseline.  Stopped iv fluid  Hypertension: On amlodipine , carvedilol , nitrates  Paroxysmal A-fib/A-flutter/history of tachybradycardia syndrome status post pacemaker placement: Cardiology was consulted.  Rate currently stable.  Eliquis  on hold.  Needs to be held for foreseeable future.  Has increased risk for stroke.  Cardiology signed out  History of severe aortic stenosis: Status post TAVR.  Follows with cardiology  Chronic diastolic CHF/CAD: Echo has shown normal left ventricular ejection fraction.  Had episodes of NSVT, currently asymptomatic..  Monitor on telemetry.  Continue coreg   Hypothyroidism :Continue levothyroxine   Cholelithiasis:Incidentally seen on CT.  Asymptomatic.  Also has abdominal hernia  Deconditioning/debility: Patient lives alone.  Still driving.  Does not have family.  PT recommended outpatient follow-up.        DVT prophylaxis:SCDs Start: 02/09/24 1628     Code Status: Full Code  Family Communication: None at bedside  Patient status:Inpatient  Patient is from :home  Anticipated discharge un:ynfz  Estimated DC date: Likely tomorrow if hematuria continues to improve and hemoglobin remained stable  Consultants: Urology, cardiology  Procedures: None  yet  Antimicrobials:  Anti-infectives (From admission, onward)    Start     Dose/Rate Route Frequency Ordered Stop   02/11/24 1130  cefTRIAXone  (ROCEPHIN ) 1 g in sodium chloride  0.9 % 100 mL IVPB        1 g 200 mL/hr over 30 Minutes Intravenous Every 24 hours 02/11/24 1031 02/16/24 9077       Subjective: Patient seen and examined at bedside today.  Hemodynamically stable.  Comfortable.  Lying in bed.  No new issues today.  Urine on the bag still looks like tea colored.  We discussed about possible home tomorrow  Objective: Vitals:   02/19/24 1712 02/20/24 0125 02/20/24 0406 02/20/24 0731  BP:  (!) 160/82 (!) 159/78 (!) 166/85  Pulse: 76 72 73 77  Resp:  20 18 18   Temp:  98 F (36.7 C) 97.6 F (36.4 C) 97.8 F (36.6 C)  TempSrc:  Oral Oral Oral  SpO2:  94% 94% 92%  Weight:      Height:        Intake/Output Summary (Last 24 hours) at 02/20/2024 1043 Last data filed at 02/20/2024 9092 Gross per 24 hour  Intake 5295.44 ml  Output 2825 ml  Net 2470.44 ml   Filed Weights   02/10/24 0730  Weight: 84.1 kg    Examination:   General exam: Overall comfortable, not in distress HEENT: PERRL Respiratory system:  no wheezes or crackles  Cardiovascular system: S1 & S2 heard, RRR.  Gastrointestinal system: Abdomen is nondistended, soft and nontender.  Urostomy Central nervous system: Alert and oriented Extremities: No edema, no clubbing ,no cyanosis Skin: No rashes, no ulcers,no icterus   GU: Tea-colored urine in the bag   Data Reviewed: I have personally reviewed following labs and imaging studies  CBC: Recent Labs  Lab 02/14/24 0437 02/14/24 1410 02/15/24 0437 02/16/24 0359 02/17/24 0513 02/18/24 0457 02/19/24 0319 02/20/24 0251  WBC 4.0  --  3.3* 4.5 3.9* 3.4* 3.3* 2.9*  NEUTROABS 2.6  --  2.0  --   --   --   --   --   HGB 8.8*   < > 9.3* 9.5* 8.4* 8.5* 8.8* 8.4*  HCT 27.6*   < > 29.1* 30.6* 26.4* 27.1* 27.7* 27.1*  MCV 98.9  --  98.0 98.4 96.0 97.1 96.9  96.1  PLT 79*  --  80* 86* 84* 86* 98* 104*   < > = values in this interval not displayed.   Basic Metabolic Panel: Recent Labs  Lab 02/16/24 0359 02/17/24 0513 02/18/24 0457 02/19/24 0319 02/20/24 0251  NA 141 139 140 141 141  K 4.5 4.2 4.5 4.3 4.3  CL 110 110 112* 113* 112*  CO2 19* 19* 19* 18* 18*  GLUCOSE 91 96 96 88 83  BUN 51* 53* 50* 48* 43*  CREATININE 2.68* 2.91* 2.66* 2.50* 2.48*  CALCIUM  8.9 8.8* 8.6* 8.3* 8.3*  MG  --  2.1  --   --   --      No results found for this or any previous visit (from the past 240 hours).   Radiology Studies: No results found.  Scheduled Meds:  sodium chloride    Intravenous Once   allopurinol   100 mg Oral Daily   ALPRAZolam   0.25 mg Oral QHS   amiodarone   200 mg Oral Daily   amLODipine   5 mg Oral Daily   atorvastatin   10 mg Oral QHS   carvedilol   12.5 mg Oral BID PC   feeding supplement  237 mL Oral  BID BM   isosorbide  mononitrate  30 mg Oral Daily   levothyroxine   200 mcg Oral QAC breakfast   mirtazapine   7.5 mg Oral QHS   sodium bicarbonate   1,300 mg Oral BID   sodium chloride  flush  3 mL Intravenous Q12H   Continuous Infusions:      LOS: 10 days   Ivonne Mustache, MD [facility] P1/26/2026, 10:43 AM  "

## 2024-02-21 ENCOUNTER — Ambulatory Visit

## 2024-02-21 DIAGNOSIS — D62 Acute posthemorrhagic anemia: Secondary | ICD-10-CM | POA: Diagnosis not present

## 2024-02-21 LAB — CBC
HCT: 28 % — ABNORMAL LOW (ref 39.0–52.0)
Hemoglobin: 8.8 g/dL — ABNORMAL LOW (ref 13.0–17.0)
MCH: 29.8 pg (ref 26.0–34.0)
MCHC: 31.4 g/dL (ref 30.0–36.0)
MCV: 94.9 fL (ref 80.0–100.0)
Platelets: 114 10*3/uL — ABNORMAL LOW (ref 150–400)
RBC: 2.95 MIL/uL — ABNORMAL LOW (ref 4.22–5.81)
RDW: 15.9 % — ABNORMAL HIGH (ref 11.5–15.5)
WBC: 2.9 10*3/uL — ABNORMAL LOW (ref 4.0–10.5)
nRBC: 0 % (ref 0.0–0.2)

## 2024-02-21 LAB — BASIC METABOLIC PANEL WITH GFR
Anion gap: 10 (ref 5–15)
BUN: 40 mg/dL — ABNORMAL HIGH (ref 8–23)
CO2: 17 mmol/L — ABNORMAL LOW (ref 22–32)
Calcium: 8.8 mg/dL — ABNORMAL LOW (ref 8.9–10.3)
Chloride: 113 mmol/L — ABNORMAL HIGH (ref 98–111)
Creatinine, Ser: 2.28 mg/dL — ABNORMAL HIGH (ref 0.61–1.24)
GFR, Estimated: 28 mL/min — ABNORMAL LOW
Glucose, Bld: 90 mg/dL (ref 70–99)
Potassium: 4.3 mmol/L (ref 3.5–5.1)
Sodium: 140 mmol/L (ref 135–145)

## 2024-02-21 NOTE — Progress Notes (Signed)
 Physical Therapy Treatment Patient Details Name: Jordan Oconnell MRN: [id number] DOB: 1940/06/05 Today's Date: 02/21/2024   History of Present Illness Pt is an 84 y.o. M presenting to [facility] on 02/09/24 from cardiology for evaluation of abnormal values on pacemaker transmission. Pt found to be severely anemic. Also with gross hematuria, CT imaging concerning for hepatic metastasis and concern for metastatic disease. PMHx: bladder cancer/urothelial cancer, s/p cystectomy and partial R nephrectomy, total nephrectomy, GERD, HLD, HTN, hypothyroidism, CKD, tachybradycardia syndrome, anemia, AKI, CKD.    PT Comments  Pt demo mod I bed mobility. CGA transfers and CGA amb 120' with SPC. Gait distance limited by fatigue. 1/4 DOE. Pt very motivated to mobilize, improve activity tolerance, and return home. Pt supine in bed at end of session.     If plan is discharge home, recommend the following: Assist for transportation   Can travel by private vehicle        Equipment Recommendations  None recommended by PT    Recommendations for Other Services       Precautions / Restrictions Precautions Precautions: Fall Recall of Precautions/Restrictions: Intact     Mobility  Bed Mobility Overal bed mobility: Modified Independent             General bed mobility comments: using bed functions, increased time    Transfers Overall transfer level: Needs assistance Equipment used: Straight cane Transfers: Sit to/from Stand Sit to Stand: Contact guard assist           General transfer comment: increased time to power up and stabilize balance    Ambulation/Gait Ambulation/Gait assistance: Contact guard assist Gait Distance (Feet): 120 Feet Assistive device: Straight cane Gait Pattern/deviations: Step-through pattern Gait velocity: decreased Gait velocity interpretation: <1.31 ft/sec, indicative of household ambulator   General Gait Details: Distance limited by fatigue. 1/4  DOE   Building Services Engineer Rankin (Stroke Patients Only)       Balance Overall balance assessment: Mild deficits observed, not formally tested Sitting-balance support: No upper extremity supported, Feet supported Sitting balance-Leahy Scale: Good     Standing balance support: Single extremity supported, During functional activity, Reliant on assistive device for balance Standing balance-Leahy Scale: Fair                              Hotel Manager: Impaired Factors Affecting Communication: Hearing impaired  Cognition Arousal: Alert Behavior During Therapy: WFL for tasks assessed/performed   PT - Cognitive impairments: No apparent impairments                         Following commands: Intact      Cueing Cueing Techniques: Verbal cues, Gestural cues  Exercises      General Comments General comments (skin integrity, edema, etc.): VSS on RA      Pertinent Vitals/Pain Pain Assessment Pain Assessment: No/denies pain    Home Living                          Prior Function            PT Goals (current goals can now be found in the care plan section) Acute Rehab PT Goals Patient Stated Goal: home Progress towards PT goals: Progressing toward goals    Frequency  Min 1X/week      PT Plan      Co-evaluation              AM-PAC PT 6 Clicks Mobility   Outcome Measure  Help needed turning from your back to your side while in a flat bed without using bedrails?: None Help needed moving from lying on your back to sitting on the side of a flat bed without using bedrails?: None Help needed moving to and from a bed to a chair (including a wheelchair)?: A Little Help needed standing up from a chair using your arms (e.g., wheelchair or bedside chair)?: A Little Help needed to walk in hospital room?: A Little Help needed climbing 3-5 steps  with a railing? : A Lot 6 Click Score: 19    End of Session Equipment Utilized During Treatment: Gait belt Activity Tolerance: Patient limited by fatigue Patient left: in bed;with call bell/phone within reach Nurse Communication: Mobility status PT Visit Diagnosis: Other abnormalities of gait and mobility (R26.89);Muscle weakness (generalized) (M62.81)     Time: 0831-0900 PT Time Calculation (min) (ACUTE ONLY): 29 min  Charges:    $Gait Training: 23-37 mins PT General Charges $$ ACUTE PT VISIT: 1 Visit                     Sari MATSU., PT  Office # [phone]    Erven Sari Shaker 02/21/2024, 12:18 PM

## 2024-02-21 NOTE — Progress Notes (Signed)
 Mobility Specialist Progress Note;    02/21/24 1530  Mobility  Activity Ambulated with assistance  Level of Assistance Contact guard assist, steadying assist  Assistive Device Cane  Distance Ambulated (ft) 275 ft  Activity Response Tolerated well  Mobility Referral Yes  Mobility visit 1 Mobility  Mobility Specialist Start Time (ACUTE ONLY) 1530  Mobility Specialist Stop Time (ACUTE ONLY) 1550  Mobility Specialist Time Calculation (min) (ACUTE ONLY) 20 min   Pt received in bed agreeable to mobility. No physical assistance required, CGA for safety. Once ambulating pt appeared SoB, no complaints. Once returned to bed pt began to desat, SPO2 reaching the mid 70's, pt admitted he pushed himself, and does feel short of breath. Given 1L/min to regulate SPO2 to 100% at rest. Once regulated and visual dyspnea concluded, pt left on RA, SPO2 92%. Call bell and personal belongings in reach. All needs met. Nurse aware.   Ricky Janeal MATSU, BS Mobility Specialist Please contact via Special Educational Needs Teacher or [employer] [phone]

## 2024-02-21 NOTE — Plan of Care (Signed)
" °  Problem: Clinical Measurements: Goal: Ability to maintain clinical measurements within normal limits will improve Outcome: Progressing   Problem: Clinical Measurements: Goal: Diagnostic test results will improve Outcome: Progressing   Problem: Clinical Measurements: Goal: Respiratory complications will improve Outcome: Progressing   Problem: Activity: Goal: Risk for activity intolerance will decrease Outcome: Progressing   Problem: Elimination: Goal: Will not experience complications related to bowel motility Outcome: Progressing   Problem: Elimination: Goal: Will not experience complications related to bowel motility Outcome: Progressing   "

## 2024-02-21 NOTE — Plan of Care (Signed)
" °  Problem: Clinical Measurements: Goal: Ability to maintain clinical measurements within normal limits will improve Outcome: Progressing Goal: Diagnostic test results will improve Outcome: Progressing Goal: Respiratory complications will improve Outcome: Progressing Goal: Cardiovascular complication will be avoided Outcome: Progressing   Problem: Activity: Goal: Risk for activity intolerance will decrease Outcome: Progressing   Problem: Elimination: Goal: Will not experience complications related to bowel motility Outcome: Progressing   "

## 2024-02-21 NOTE — Progress Notes (Signed)
 " PROGRESS NOTE  Jordan Oconnell  FMW:[id number] DOB: 02-Aug-1940 DOA: 02/09/2024 PCP: Shepard Ade, MD   Brief Narrative: Patient is a 84 year old male with history of hypertension, hyperlipidemia, GERD, hypothyroidism, secondary , solitary kidney, A-fib/tachybradycardia syndrome/status post placement replacement, aortic stenosis status post TAVR, chronic diastolic CHF, depression, anemia, bladder cancer status post cystectomy/urostomy, left total nephrectomy, right partial nephrectomy presented as a direct admission from from cardiology office after he presented with fatigue, reported blood in urine in nephrostomy bag.  On presentation, he was found to be anemic, lab work showed AKI.  Urology, cardiology consulted.   Hematuria slowly improving, hemoglobin has been stable in the range of 8.  Possible discharge to home tomorrow.  Seen by PT today.  PT recommending another session before discharge.   Assessment & Plan:  Principal Problem:   Acute blood loss anemia Active Problems:   Essential hypertension   Bladder cancer (HCC)   PAF (paroxysmal atrial fibrillation) (HCC)   CKD (chronic kidney disease) stage 4, GFR 15-29 ml/min (HCC)   Severe aortic stenosis   S/P TAVR (transcatheter aortic valve replacement)   Pacemaker   Tachycardia-bradycardia syndrome (HCC)   Chronic combined systolic and diastolic CHF (congestive heart failure) (HCC)   Hypothyroidism   Gastro-esophageal reflux disease without esophagitis   Hyperlipidemia   Major depression, single episode   Presence of urostomy (HCC)   Heart block AV complete (HCC)   Coronary artery disease involving native coronary artery of native heart without angina pectoris   Hypothyroidism due to amiodarone   Acute blood loss anemia secondary to hematuria: Presented with gross hematuria.  Takes Eliquis .  On presentation, hemoglobin was 5.5.  Given 4 units of PRBCs so far.  Hemoglobin stable in the range of 8.8 today  Gross  hematuria/history of bladder cancer/urothelial cancer/RCC : Has complicated urological history.  Status post cystectomy, ureteral stone.  Also status post partial right nephrectomy, total nephrectomy.  Presented with gross hematuria.  Eliquis , aspirin  on hold.  Follows with [facility] urology/oncology: Dr Raynaldo.  Urology following here.  CT imaging showed concern for hepatic metastasis, also showed new retroperitoneal and right inguinal lymphadenopathy concerning for metastatic disease, mass in the right pelvic collecting system.  Urology contacted with his oncologist at [facility]. Started on tranexamic acid .  Finished five days course of  ceftriaxone .  Urine in the bag continues to  look  reddish today but improving. Urology cleared for discharge and recommended outpatient follow-up.  AKI on CKD stage IV: Baseline creatinine from 2.5-3.  Normal kidney.  Close to baseline.  Stopped iv fluid  Hypertension: On amlodipine , carvedilol , nitrates  Paroxysmal A-fib/A-flutter/history of tachybradycardia syndrome status post pacemaker placement: Cardiology was consulted.  Rate currently stable.  Eliquis  on hold.  Needs to be held for foreseeable future.  Has increased risk for stroke.  Cardiology signed out  History of severe aortic stenosis: Status post TAVR.  Follows with cardiology  Chronic diastolic CHF/CAD: Echo has shown normal left ventricular ejection fraction.  Had episodes of NSVT, currently asymptomatic..  Monitor on telemetry.  Continue coreg   Hypothyroidism :Continue levothyroxine   Cholelithiasis:Incidentally seen on CT.  Asymptomatic.  Also has abdominal hernia  Deconditioning/debility: Patient lives alone.  Still driving.  Does not have family.  PT recommended outpatient follow-up.        DVT prophylaxis:SCDs Start: 02/09/24 1628     Code Status: Full Code  Family Communication: None at bedside  Patient status:Inpatient  Patient is from :home  Anticipated discharge  un:ynfz  Estimated DC  date: Likely tomorrow.  Patient lives alone.  PT recommending another session tomorrow morning.  Patient needs some time for preparation  Consultants: Urology, cardiology  Procedures: None yet  Antimicrobials:  Anti-infectives (From admission, onward)    Start     Dose/Rate Route Frequency Ordered Stop   02/11/24 1130  cefTRIAXone  (ROCEPHIN ) 1 g in sodium chloride  0.9 % 100 mL IVPB        1 g 200 mL/hr over 30 Minutes Intravenous Every 24 hours 02/11/24 1031 02/16/24 9077       Subjective: Patient seen and examined at bedside today.  Hemodynamically stable.  He worked with the physical therapist.  Urine in the Foley bag still appears red but less intense than few days ago.  Hemoglobin stable.  Denies new complaints  Objective: Vitals:   02/20/24 2100 02/20/24 2328 02/21/24 0436 02/21/24 0746  BP: (!) 152/79 (!) 147/69 (!) 158/75 (!) 152/80  Pulse: 68 69 77 86  Resp: 16 18 18 18   Temp: 97.9 F (36.6 C) 97.9 F (36.6 C) 97.8 F (36.6 C) 98.1 F (36.7 C)  TempSrc: Oral Oral Oral Oral  SpO2: 96% 94% 95% 94%  Weight:      Height:        Intake/Output Summary (Last 24 hours) at 02/21/2024 1134 Last data filed at 02/21/2024 0439 Gross per 24 hour  Intake 0 ml  Output 1750 ml  Net -1750 ml   Filed Weights   02/10/24 0730  Weight: 84.1 kg    Examination:   General exam: Overall comfortable, not in distress, very pleasant elderly male HEENT: PERRL Respiratory system:  no wheezes or crackles  Cardiovascular system: S1 & S2 heard, RRR.  Gastrointestinal system: Abdomen is nondistended, soft and nontender.,  Urostomy Central nervous system: Alert and oriented Extremities: No edema, no clubbing ,no cyanosis Skin: No rashes, no ulcers,no icterus   GU: Pinkish urine in the Foley bag   Data Reviewed: I have personally reviewed following labs and imaging studies  CBC: Recent Labs  Lab 02/15/24 0437 02/16/24 0359 02/17/24 0513 02/18/24 0457  02/19/24 0319 02/20/24 0251 02/21/24 0424  WBC 3.3*   < > 3.9* 3.4* 3.3* 2.9* 2.9*  NEUTROABS 2.0  --   --   --   --   --   --   HGB 9.3*   < > 8.4* 8.5* 8.8* 8.4* 8.8*  HCT 29.1*   < > 26.4* 27.1* 27.7* 27.1* 28.0*  MCV 98.0   < > 96.0 97.1 96.9 96.1 94.9  PLT 80*   < > 84* 86* 98* 104* 114*   < > = values in this interval not displayed.   Basic Metabolic Panel: Recent Labs  Lab 02/17/24 0513 02/18/24 0457 02/19/24 0319 02/20/24 0251 02/21/24 0424  NA 139 140 141 141 140  K 4.2 4.5 4.3 4.3 4.3  CL 110 112* 113* 112* 113*  CO2 19* 19* 18* 18* 17*  GLUCOSE 96 96 88 83 90  BUN 53* 50* 48* 43* 40*  CREATININE 2.91* 2.66* 2.50* 2.48* 2.28*  CALCIUM  8.8* 8.6* 8.3* 8.3* 8.8*  MG 2.1  --   --   --   --      No results found for this or any previous visit (from the past 240 hours).   Radiology Studies: No results found.  Scheduled Meds:  sodium chloride    Intravenous Once   allopurinol   100 mg Oral Daily   ALPRAZolam   0.25 mg Oral QHS   amiodarone   200 mg Oral Daily   amLODipine   10 mg Oral Daily   atorvastatin   10 mg Oral QHS   carvedilol   12.5 mg Oral BID PC   feeding supplement  237 mL Oral BID BM   isosorbide  mononitrate  30 mg Oral Daily   levothyroxine   200 mcg Oral QAC breakfast   mirtazapine   7.5 mg Oral QHS   sodium bicarbonate   1,300 mg Oral BID   sodium chloride  flush  3 mL Intravenous Q12H   Continuous Infusions:      LOS: 11 days   Ivonne Mustache, MD [facility] P1/27/2026, 11:34 AM  "

## 2024-02-22 DIAGNOSIS — D62 Acute posthemorrhagic anemia: Secondary | ICD-10-CM | POA: Diagnosis not present

## 2024-02-22 LAB — CBC
HCT: 28.3 % — ABNORMAL LOW (ref 39.0–52.0)
Hemoglobin: 9 g/dL — ABNORMAL LOW (ref 13.0–17.0)
MCH: 29.7 pg (ref 26.0–34.0)
MCHC: 31.8 g/dL (ref 30.0–36.0)
MCV: 93.4 fL (ref 80.0–100.0)
Platelets: 127 10*3/uL — ABNORMAL LOW (ref 150–400)
RBC: 3.03 MIL/uL — ABNORMAL LOW (ref 4.22–5.81)
RDW: 15.8 % — ABNORMAL HIGH (ref 11.5–15.5)
WBC: 3 10*3/uL — ABNORMAL LOW (ref 4.0–10.5)
nRBC: 0 % (ref 0.0–0.2)

## 2024-02-22 LAB — BASIC METABOLIC PANEL WITH GFR
Anion gap: 11 (ref 5–15)
BUN: 42 mg/dL — ABNORMAL HIGH (ref 8–23)
CO2: 19 mmol/L — ABNORMAL LOW (ref 22–32)
Calcium: 8.9 mg/dL (ref 8.9–10.3)
Chloride: 111 mmol/L (ref 98–111)
Creatinine, Ser: 2.44 mg/dL — ABNORMAL HIGH (ref 0.61–1.24)
GFR, Estimated: 25 mL/min — ABNORMAL LOW
Glucose, Bld: 91 mg/dL (ref 70–99)
Potassium: 4.5 mmol/L (ref 3.5–5.1)
Sodium: 140 mmol/L (ref 135–145)

## 2024-02-22 NOTE — Plan of Care (Signed)
  Problem: Clinical Measurements: Goal: Ability to maintain clinical measurements within normal limits will improve Outcome: Progressing   Problem: Clinical Measurements: Goal: Diagnostic test results will improve Outcome: Progressing   Problem: Clinical Measurements: Goal: Respiratory complications will improve Outcome: Progressing   Problem: Clinical Measurements: Goal: Cardiovascular complication will be avoided Outcome: Progressing   Problem: Activity: Goal: Risk for activity intolerance will decrease Outcome: Progressing

## 2024-02-22 NOTE — Plan of Care (Signed)
" °  Problem: Clinical Measurements: Goal: Ability to maintain clinical measurements within normal limits will improve Outcome: Progressing Goal: Diagnostic test results will improve Outcome: Progressing Goal: Respiratory complications will improve Outcome: Progressing Goal: Cardiovascular complication will be avoided Outcome: Progressing   Problem: Activity: Goal: Risk for activity intolerance will decrease Outcome: Progressing   Problem: Elimination: Goal: Will not experience complications related to bowel motility Outcome: Progressing   "

## 2024-02-22 NOTE — Progress Notes (Signed)
 Mobility Specialist Progress Note;    02/22/24 1336  Mobility  Activity Ambulated with assistance  Level of Assistance Contact guard assist, steadying assist  Assistive Device Cane  Distance Ambulated (ft) 150 ft  Activity Response Tolerated well  Mobility Referral Yes  Mobility visit 1 Mobility  Mobility Specialist Start Time (ACUTE ONLY) 1336  Mobility Specialist Stop Time (ACUTE ONLY) 1350  Mobility Specialist Time Calculation (min) (ACUTE ONLY) 14 min   Patient received in bed agreeable to participate in mobility. No physical assistance required, CGA for safety. Ambulated in hallways with steady gait. Pt visibly seem SOB SPO2 95% when returned to room. VSS through on RA. No c/o throughout but stated he was anxious and pt seemed discouraged from his previous walk with PT. Pt Was left in bed with call bell and personal belongings in reach. Bed alarm on. All needs met.   Florine Oak Mobility Specialist Please Neurosurgeon or [employer] [phone]

## 2024-02-22 NOTE — Progress Notes (Signed)
 " PROGRESS NOTE  Jordan Oconnell  FMW:[id number] DOB: Jun 24, 1940 DOA: 02/09/2024 PCP: Shepard Ade, MD   Brief Narrative: Patient is a 84 year old male with history of hypertension, hyperlipidemia, GERD, hypothyroidism, secondary , solitary kidney, A-fib/tachybradycardia syndrome/status post placement replacement, aortic stenosis status post TAVR, chronic diastolic CHF, depression, anemia, bladder cancer status post cystectomy/urostomy, left total nephrectomy, right partial nephrectomy presented as a direct admission from from cardiology office after he presented with fatigue, reported blood in urine in nephrostomy bag.  On presentation, he was found to be anemic, lab work showed AKI.  Urology, cardiology consulted.   Hematuria slowly improving, hemoglobin has been stable in the range of 9.  Possible discharge to home tomorrow.  Patient requesting few episodes of mobility sessions before discharge.  Looking for tomorrow.  Assessment & Plan:  Principal Problem:   Acute blood loss anemia Active Problems:   Essential hypertension   Bladder cancer (HCC)   PAF (paroxysmal atrial fibrillation) (HCC)   CKD (chronic kidney disease) stage 4, GFR 15-29 ml/min (HCC)   Severe aortic stenosis   S/P TAVR (transcatheter aortic valve replacement)   Pacemaker   Tachycardia-bradycardia syndrome (HCC)   Chronic combined systolic and diastolic CHF (congestive heart failure) (HCC)   Hypothyroidism   Gastro-esophageal reflux disease without esophagitis   Hyperlipidemia   Major depression, single episode   Presence of urostomy (HCC)   Heart block AV complete (HCC)   Coronary artery disease involving native coronary artery of native heart without angina pectoris   Hypothyroidism due to amiodarone   Acute blood loss anemia secondary to hematuria: Presented with gross hematuria.  Takes Eliquis .  On presentation, hemoglobin was 5.5.  Given 4 units of PRBCs so far.  Hemoglobin stable in the range of 9  today  Gross hematuria/history of bladder cancer/urothelial cancer/RCC : Has complicated urological history.  Status post cystectomy, ureteral stone.  Also status post partial right nephrectomy, total nephrectomy.  Presented with gross hematuria.  Eliquis , aspirin  on hold.  Follows with [facility]/oncology: Dr Raynaldo.  Urology following here.  CT imaging showed concern for hepatic metastasis, also showed new retroperitoneal and right inguinal lymphadenopathy concerning for metastatic disease, mass in the right pelvic collecting system.  Urology contacted with his oncologist at [facility]. Started on tranexamic acid .  Finished five days course of  ceftriaxone .  Urine in the bag continues to  look  reddish today but improving. Urology cleared for discharge and recommended outpatient follow-up.  AKI on CKD stage IV: Baseline creatinine from 2.5-3.  Normal kidney.  Close to baseline.  Stopped iv fluid  Hypertension: On amlodipine , carvedilol , nitrates  Paroxysmal A-fib/A-flutter/history of tachybradycardia syndrome status post pacemaker placement: Cardiology was consulted.  Rate currently stable.  Eliquis  on hold.  Needs to be held for foreseeable future.  Has increased risk for stroke.  Cardiology signed out  History of severe aortic stenosis: Status post TAVR.  Follows with cardiology  Chronic diastolic CHF/CAD: Echo has shown normal left ventricular ejection fraction.  Had episodes of NSVT, currently asymptomatic..  Monitor on telemetry.  Continue coreg   Hypothyroidism :Continue levothyroxine   Cholelithiasis:Incidentally seen on CT.  Asymptomatic.  Also has abdominal hernia  Deconditioning/debility: Patient lives alone.  Still driving.  Does not have family.  PT recommended outpatient follow-up.  Patient is requesting home health PT, RN.  TOC consulted        DVT prophylaxis:SCDs Start: 02/09/24 1628     Code Status: Full Code  Family Communication: Discussed with daughter and son on  phone on 1/28  Patient status:Inpatient  Patient is from :home  Anticipated discharge un:ynfz  Estimated DC date: Likely tomorrow.  Patient lives alone.   Patient needs some time for preparation.  Requesting to have some mobility sessions before discharge  Consultants: Urology, cardiology  Procedures: None yet  Antimicrobials:  Anti-infectives (From admission, onward)    Start     Dose/Rate Route Frequency Ordered Stop   02/11/24 1130  cefTRIAXone  (ROCEPHIN ) 1 g in sodium chloride  0.9 % 100 mL IVPB        1 g 200 mL/hr over 30 Minutes Intravenous Every 24 hours 02/11/24 1031 02/16/24 9077       Subjective: Seen and examined at bedside today.  Hemodynamically stable.  Urine in the Foley bag still looks pink with some clots.  But hemoglobin is stable.  He requested to be kept in the hospital for another day.  Request to be moved.  I had a long discussion with his daughter and son on phone from bedside.  Objective: Vitals:   02/21/24 1944 02/22/24 0019 02/22/24 0458 02/22/24 0813  BP: 126/72 (!) 153/59 (!) 156/78 (!) 157/84  Pulse: 65 65 75 81  Resp: 16 17 18 18   Temp: 97.7 F (36.5 C) 97.7 F (36.5 C) 97.7 F (36.5 C) 97.7 F (36.5 C)  TempSrc: Oral Oral Oral Oral  SpO2: 96% 92% 96% 96%  Weight:      Height:        Intake/Output Summary (Last 24 hours) at 02/22/2024 1026 Last data filed at 02/22/2024 9173 Gross per 24 hour  Intake 120 ml  Output 1400 ml  Net -1280 ml   Filed Weights   02/10/24 0730  Weight: 84.1 kg    Examination:   General exam: Overall comfortable, not in distress, very pleasant gentleman HEENT: PERRL Respiratory system:  no wheezes or crackles  Cardiovascular system: S1 & S2 heard, RRR.  Gastrointestinal system: Abdomen is nondistended, soft and nontender.  Urostomy Central nervous system: Alert and oriented Extremities: No edema, no clubbing ,no cyanosis Skin: No rashes, no ulcers,no icterus   GU: Red urine in the Foley  bag   Data Reviewed: I have personally reviewed following labs and imaging studies  CBC: Recent Labs  Lab 02/18/24 0457 02/19/24 0319 02/20/24 0251 02/21/24 0424 02/22/24 0523  WBC 3.4* 3.3* 2.9* 2.9* 3.0*  HGB 8.5* 8.8* 8.4* 8.8* 9.0*  HCT 27.1* 27.7* 27.1* 28.0* 28.3*  MCV 97.1 96.9 96.1 94.9 93.4  PLT 86* 98* 104* 114* 127*   Basic Metabolic Panel: Recent Labs  Lab 02/17/24 0513 02/18/24 0457 02/19/24 0319 02/20/24 0251 02/21/24 0424 02/22/24 0523  NA 139 140 141 141 140 140  K 4.2 4.5 4.3 4.3 4.3 4.5  CL 110 112* 113* 112* 113* 111  CO2 19* 19* 18* 18* 17* 19*  GLUCOSE 96 96 88 83 90 91  BUN 53* 50* 48* 43* 40* 42*  CREATININE 2.91* 2.66* 2.50* 2.48* 2.28* 2.44*  CALCIUM  8.8* 8.6* 8.3* 8.3* 8.8* 8.9  MG 2.1  --   --   --   --   --      No results found for this or any previous visit (from the past 240 hours).   Radiology Studies: No results found.  Scheduled Meds:  sodium chloride    Intravenous Once   allopurinol   100 mg Oral Daily   ALPRAZolam   0.25 mg Oral QHS   amiodarone   200 mg Oral Daily   amLODipine   10 mg  Oral Daily   atorvastatin   10 mg Oral QHS   carvedilol   12.5 mg Oral BID PC   feeding supplement  237 mL Oral BID BM   isosorbide  mononitrate  30 mg Oral Daily   levothyroxine   200 mcg Oral QAC breakfast   mirtazapine   7.5 mg Oral QHS   sodium bicarbonate   1,300 mg Oral BID   sodium chloride  flush  3 mL Intravenous Q12H   Continuous Infusions:      LOS: 12 days   Ivonne Mustache, MD [facility] P1/28/2026, 10:26 AM  "

## 2024-02-22 NOTE — Progress Notes (Signed)
 Physical Therapy Treatment Patient Details Name: Jordan Oconnell MRN: [id number] DOB: [birth year] Today's Date: 02/22/2024   History of Present Illness Pt is an 84 y.o. M presenting to [facility] on 02/09/24 from cardiology for evaluation of abnormal values on pacemaker transmission. Pt found to be severely anemic. Also with gross hematuria, CT imaging concerning for hepatic metastasis and concern for metastatic disease. PMHx: bladder cancer/urothelial cancer, s/p cystectomy and partial R nephrectomy, total nephrectomy, GERD, HLD, HTN, hypothyroidism, CKD, tachybradycardia syndrome, anemia, AKI, CKD.    PT Comments  Pt received in supine and agreeable for PT session. Pt continues to be limited by decreased activity tolerance and fatigue. Discussed and practiced setting goals for mobility that are achievable. Pt can be eager to increase gait distance with poor pacing. Encouraged standing and seated rest breaks with pt required one standing and one seated rest break when ambulating 164ft with CGA and SP cane. Pt reported being fatigued and was unable to ambulate back to the room. Assisted with BLE with chair push back to the room. Recommending return home with HHPT. Will continue to follow acutely.    If plan is discharge home, recommend the following: Assist for transportation   Can travel by private vehicle      Yes  Equipment Recommendations  None recommended by PT       Precautions / Restrictions Precautions Precautions: Fall Recall of Precautions/Restrictions: Intact Restrictions Weight Bearing Restrictions Per Provider Order: No     Mobility  Bed Mobility Overal bed mobility: Modified Independent      General bed mobility comments: using bed functions, increased time    Transfers Overall transfer level: Needs assistance Equipment used: Straight cane Transfers: Sit to/from Stand Sit to Stand: Contact guard assist    General transfer comment: Increased time to reach upright  posture with B UE pushing from EOB    Ambulation/Gait Ambulation/Gait assistance: Contact guard assist Gait Distance (Feet): 100 Feet Assistive device: Straight cane Gait Pattern/deviations: Step-through pattern Gait velocity: decreased    General Gait Details: x1 standing rest break, distance limited by fatigue. 1/4 DOE    Balance Overall balance assessment: Mild deficits observed, not formally tested     Communication Communication Communication: Impaired Factors Affecting Communication: Hearing impaired  Cognition Arousal: Alert Behavior During Therapy: WFL for tasks assessed/performed   PT - Cognitive impairments: No apparent impairments    Following commands: Intact      Cueing Cueing Techniques: Verbal cues, Gestural cues         Pertinent Vitals/Pain Pain Assessment Pain Assessment: No/denies pain     PT Goals (current goals can now be found in the care plan section) Acute Rehab PT Goals Patient Stated Goal: to be able to walk further PT Goal Formulation: With patient Time For Goal Achievement: 02/25/24 Potential to Achieve Goals: Good Progress towards PT goals: Progressing toward goals    Frequency    Min 1X/week       AM-PAC PT 6 Clicks Mobility   Outcome Measure  Help needed turning from your back to your side while in a flat bed without using bedrails?: None Help needed moving from lying on your back to sitting on the side of a flat bed without using bedrails?: None Help needed moving to and from a bed to a chair (including a wheelchair)?: A Little Help needed standing up from a chair using your arms (e.g., wheelchair or bedside chair)?: A Little Help needed to walk in hospital room?: A Little Help needed climbing  3-5 steps with a railing? : A Lot 6 Click Score: 19    End of Session   Activity Tolerance: Patient limited by fatigue Patient left: in bed;with call bell/phone within reach Nurse Communication: Mobility status PT Visit  Diagnosis: Other abnormalities of gait and mobility (R26.89);Muscle weakness (generalized) (M62.81)     Time: 9072-9057 PT Time Calculation (min) (ACUTE ONLY): 15 min  Charges:    $Gait Training: 8-22 mins PT General Charges $$ ACUTE PT VISIT: 1 Visit                    Jordan Oconnell, PT, DPT Secure Chat Preferred  Rehab Office [phone]  Jordan Oconnell Jordan Oconnell 02/22/2024, 10:30 AM

## 2024-02-22 NOTE — Progress Notes (Signed)
 Mobility Specialist Progress Note;    02/22/24 0917  Mobility  Activity Ambulated with assistance;Pivoted/transferred to/from [facility]  Level of Assistance Contact guard assist, steadying assist  Assistive Device BSC  Activity Response Tolerated well  Mobility Referral Yes  Mobility visit 1 Mobility  Mobility Specialist Start Time (ACUTE ONLY) 0913  Mobility Specialist Stop Time (ACUTE ONLY) [id number]  Mobility Specialist Time Calculation (min) (ACUTE ONLY) 10 min   Pt received on BSC, requesting to get back in bed. CGA for safety. Pt declined further ambulation due to previous walk this morning. Will f/u for further mobility. Pt left in bed with call bell and personal belongings in reach. All needs met.  Florine Oak Mobility Specialist Please Neurosurgeon or [employer] [phone]

## 2024-02-22 NOTE — TOC Progression Note (Signed)
 Transition of Care [facility]) - Progression Note    Patient Details  Name: Jordan Oconnell MRN: [id number] Date of Birth: [birth year]  Transition of [facility] CM/SW Contact  Graves-Bigelow, Erminio Deems, RN Phone Number: 02/22/2024, 11:47 AM  Clinical Narrative: ICM spoke with daughter regarding disposition needs. Patient is from home alone and in need for home health. Daughter states patient has used [facility] in the past and wants to use them again. Referral submitted via the hub and the agency has accepted the patient. Patient has DME: rollator/ shower seat/ cane/ walker/ and ostomy supplies. Daughter states patient will have transportation home via friends. Daughter states she will be visiting the patient on Feb 3 from [place] . Daughter will be reaching out to [employer] regarding personal care services. No further needs identified at this time.     Expected Discharge Plan: Home w Home Health Services Barriers to Discharge: No Barriers Identified  Expected Discharge Plan and Services In-house Referral: NA Discharge Planning Services: CM Consult Post Acute Care Choice: Home Health Living arrangements for the past 2 months: Single Family Home  HH Arranged: RN, Disease Management, PT, Nurse's Aide HH Agency: [facility] Date [facility] Agency Contacted: 02/22/24 Time HH Agency Contacted: 1145 Representative spoke with at [facility] Agency: Hub   Social Drivers of Health (SDOH) Interventions SDOH Screenings   Food Insecurity: No Food Insecurity (02/09/2024)  Housing: Low Risk (02/09/2024)  Transportation Needs: No Transportation Needs (02/09/2024)  Utilities: Not At Risk (02/09/2024)  Social Connections: Unknown (02/09/2024)  Tobacco Use: Low Risk (02/10/2024)    Readmission Risk Interventions    05/04/2023   10:12 AM 03/08/2023   12:36 PM  Readmission Risk Prevention Plan  Transportation Screening Complete Complete  PCP or Specialist Appt within 3-5 Days Complete Complete  HRI or Home  Care Consult Complete Complete  Social Work Consult for Recovery Care Planning/Counseling Complete Complete  Palliative Care Screening Not Applicable Not Applicable  Medication Review Oceanographer) Complete Complete

## 2024-02-23 ENCOUNTER — Other Ambulatory Visit (HOSPITAL_COMMUNITY): Payer: Self-pay

## 2024-02-23 DIAGNOSIS — D62 Acute posthemorrhagic anemia: Secondary | ICD-10-CM | POA: Diagnosis not present

## 2024-02-23 LAB — CBC
HCT: 28.8 % — ABNORMAL LOW (ref 39.0–52.0)
Hemoglobin: 8.9 g/dL — ABNORMAL LOW (ref 13.0–17.0)
MCH: 29 pg (ref 26.0–34.0)
MCHC: 30.9 g/dL (ref 30.0–36.0)
MCV: 93.8 fL (ref 80.0–100.0)
Platelets: 126 10*3/uL — ABNORMAL LOW (ref 150–400)
RBC: 3.07 MIL/uL — ABNORMAL LOW (ref 4.22–5.81)
RDW: 15.9 % — ABNORMAL HIGH (ref 11.5–15.5)
WBC: 3.7 10*3/uL — ABNORMAL LOW (ref 4.0–10.5)
nRBC: 0 % (ref 0.0–0.2)

## 2024-02-23 LAB — BASIC METABOLIC PANEL WITH GFR
Anion gap: 10 (ref 5–15)
BUN: 46 mg/dL — ABNORMAL HIGH (ref 8–23)
CO2: 20 mmol/L — ABNORMAL LOW (ref 22–32)
Calcium: 9.1 mg/dL (ref 8.9–10.3)
Chloride: 112 mmol/L — ABNORMAL HIGH (ref 98–111)
Creatinine, Ser: 2.56 mg/dL — ABNORMAL HIGH (ref 0.61–1.24)
GFR, Estimated: 24 mL/min — ABNORMAL LOW
Glucose, Bld: 97 mg/dL (ref 70–99)
Potassium: 4.5 mmol/L (ref 3.5–5.1)
Sodium: 141 mmol/L (ref 135–145)

## 2024-02-23 MED ORDER — AMIODARONE HCL 200 MG PO TABS
200.0000 mg | ORAL_TABLET | Freq: Every day | ORAL | 0 refills | Status: AC
  Filled 2024-02-23 (×2): qty 60, 60d supply, fill #0

## 2024-02-23 MED ORDER — ISOSORBIDE MONONITRATE ER 30 MG PO TB24
30.0000 mg | ORAL_TABLET | Freq: Every day | ORAL | 0 refills | Status: AC
  Filled 2024-02-23: qty 60, 60d supply, fill #0

## 2024-02-23 MED ORDER — CARVEDILOL 12.5 MG PO TABS
12.5000 mg | ORAL_TABLET | Freq: Two times a day (BID) | ORAL | 0 refills | Status: AC
  Filled 2024-02-23 (×2): qty 60, 30d supply, fill #0

## 2024-02-23 MED ORDER — SODIUM BICARBONATE 650 MG PO TABS
1300.0000 mg | ORAL_TABLET | Freq: Two times a day (BID) | ORAL | 0 refills | Status: AC
  Filled 2024-02-23: qty 120, 30d supply, fill #0

## 2024-02-23 MED ORDER — AMLODIPINE BESYLATE 10 MG PO TABS
10.0000 mg | ORAL_TABLET | Freq: Every day | ORAL | 0 refills | Status: AC
  Filled 2024-02-23: qty 90, 90d supply, fill #0

## 2024-02-23 NOTE — Discharge Summary (Signed)
 Physician Discharge Summary  Jordan Oconnell FMW:[id number] DOB: 1940-10-14 DOA: 02/09/2024  PCP: Shepard Ade, MD  Admit date: 02/09/2024 Discharge date: 02/23/2024  Admitted From: Home Disposition:  Home  Discharge Condition:Stable CODE STATUS:FULL Diet recommendation: Heart Healthy   Brief/Interim Summary: Patient is a 84 year old male with history of hypertension, hyperlipidemia, GERD, hypothyroidism, secondary , solitary kidney, A-fib/tachybradycardia syndrome/status post placement replacement, aortic stenosis status post TAVR, chronic diastolic CHF, depression, anemia, bladder cancer status post cystectomy/urostomy, left total nephrectomy, right partial nephrectomy presented as a direct admission from from cardiology office after he presented with fatigue, reported blood in urine in nephrostomy bag.  On presentation, he was found to be anemic, lab work showed AKI.  Urology, cardiology consulted.   Hematuria slowly improving, hemoglobin has been stable in the range of 9.  Urology cleared for discharge.  Medically stable for discharge home today.   Following problems were addressed during the hospitalization:   Acute blood loss anemia secondary to hematuria: Presented with gross hematuria.  Takes Eliquis .  On presentation, hemoglobin was 5.5.  Given 4 units of PRBCs so far.  Hemoglobin stable in the range of 8.9 today   Gross hematuria/history of bladder cancer/urothelial cancer/RCC : Has complicated urological history.  Status post cystectomy, ureteral stone.  Also status post partial right nephrectomy, total nephrectomy.  Presented with gross hematuria.  Eliquis , aspirin  on hold.  Follows with [facility] urology/oncology: Dr Raynaldo.  Urology following here.  CT imaging showed concern for hepatic metastasis, also showed new retroperitoneal and right inguinal lymphadenopathy concerning for metastatic disease, mass in the right pelvic collecting system.  Urology contacted with his oncologist  at [facility]. Started on tranexamic acid .  Finished five days course of  ceftriaxone .  Urine in the bag continues to  look  reddish today but improving. Urology cleared for discharge and recommended outpatient follow-up.   AKI on CKD stage IV: Baseline creatinine from 2.5-3.  Normal kidney.  Close to baseline.     Hypertension: On amlodipine , carvedilol , imdur    Paroxysmal A-fib/A-flutter/history of tachybradycardia syndrome status post pacemaker placement: Cardiology was consulted.  Rate currently stable.  Eliquis  on hold.  Needs to be held for foreseeable future.  Has increased risk for stroke though.  Cardiology signed out   History of severe aortic stenosis: Status post TAVR.  Follows with cardiology   Chronic diastolic CHF/CAD: Echo has shown normal left ventricular ejection fraction.  Had episodes of NSVT, currently asymptomatic. Continue coreg    Hypothyroidism :Continue levothyroxine    Cholelithiasis:Incidentally seen on CT.  Asymptomatic.  Also has abdominal hernia   Deconditioning/debility: Patient lives alone.  Still driving.  Does not have family.  PT recommended outpatient follow-up.  Patient  requesting home health PT, RN.  TOC consulted    Discharge Diagnoses:  Principal Problem:   Acute blood loss anemia Active Problems:   Essential hypertension   Bladder cancer (HCC)   PAF (paroxysmal atrial fibrillation) (HCC)   CKD (chronic kidney disease) stage 4, GFR 15-29 ml/min (HCC)   Severe aortic stenosis   S/P TAVR (transcatheter aortic valve replacement)   Pacemaker   Tachycardia-bradycardia syndrome (HCC)   Chronic combined systolic and diastolic CHF (congestive heart failure) (HCC)   Hypothyroidism   Gastro-esophageal reflux disease without esophagitis   Hyperlipidemia   Major depression, single episode   Presence of urostomy (HCC)   Heart block AV complete (HCC)   Coronary artery disease involving native coronary artery of native heart without angina pectoris    Hypothyroidism due to amiodarone   Discharge Instructions  Discharge Instructions     Diet - low sodium heart healthy   Complete by: As directed    Discharge instructions   Complete by: As directed    1)Please take your medications as instructed 2)Follow up with your PCP in a week.  Check CBC /BMP tests during the follow-up 3)Follow up with your urologist/oncologist at [facility] as soon as possible   Increase activity slowly   Complete by: As directed       Allergies as of 02/23/2024       Reactions   Amoxicillin Other (See Comments)   Acute interstitial nephritis   Doxycycline Hives   Flomax [tamsulosin Hcl] Other (See Comments)   Dizziness        Medication List     STOP taking these medications    aspirin  EC 81 MG tablet   ciprofloxacin  500 MG tablet Commonly known as: CIPRO    Eliquis  2.5 MG Tabs tablet Generic drug: apixaban    hydrALAZINE  25 MG tablet Commonly known as: APRESOLINE        TAKE these medications    acetaminophen  500 MG tablet Commonly known as: TYLENOL  Take 1,000 mg by mouth See admin instructions. Take 1,000 mg by mouth in the morning and evening   allopurinol  100 MG tablet Commonly known as: ZYLOPRIM  Take 1 tablet (100 mg total) by mouth daily.   ALPRAZolam  0.25 MG tablet Commonly known as: XANAX  Take 1 tablet (0.25 mg total) by mouth at bedtime.   amiodarone  200 MG tablet Commonly known as: PACERONE  Take 1 tablet (200 mg total) by mouth daily.   amLODipine  10 MG tablet Commonly known as: NORVASC  Take 1 tablet (10 mg total) by mouth daily. What changed: how much to take   atorvastatin  10 MG tablet Commonly known as: LIPITOR Take 1 tablet (10 mg total) by mouth daily. What changed: when to take this   carvedilol  12.5 MG tablet Commonly known as: COREG  Take 1 tablet (12.5 mg total) by mouth 2 (two) times daily.   cyanocobalamin  1000 MCG tablet Commonly known as: VITAMIN B12 Take 1 tablet (1,000 mcg total) by mouth  daily.   docusate sodium  100 MG capsule Commonly known as: COLACE Take 1 capsule (100 mg total) by mouth daily.   feeding supplement Liqd Take 237 mLs by mouth 2 (two) times daily between meals.   isosorbide  mononitrate 30 MG 24 hr tablet Commonly known as: IMDUR  Take 1 tablet (30 mg total) by mouth daily.   levothyroxine  200 MCG tablet Commonly known as: SYNTHROID  Take 200 mcg by mouth daily before breakfast.   mirtazapine  15 MG tablet Commonly known as: REMERON  Take 7.5 mg by mouth at bedtime.   nitroGLYCERIN  0.4 MG SL tablet Commonly known as: NITROSTAT  1 tablet under the tongue and allow to dissolve as needed. Take every 5 minutes up to 3 times if chest pain persists What changed:  how much to take when to take this reasons to take this   sodium bicarbonate  650 MG tablet Take 2 tablets (1,300 mg total) by mouth See admin instructions. Take 1,300 mg by mouth in the morning and evening   torsemide  20 MG tablet Commonly known as: DEMADEX  Take 20 mg by mouth daily as needed (fluid).        Contact information for follow-up providers     Shepard Ade, MD. Schedule an appointment as soon as possible for a visit in 1 week(s).   Specialty: Internal Medicine Contact information: [address] [place] [address]  [phone]              Contact information for after-discharge care     Home Medical Care     [facility] .   Service: Home Health Services Why: Registered Nurse, Aide, Physical Therapy-office to call with visit times. Contact information: [address] [place]  [address] [phone]                    Allergies[1]  Consultations: Urology,cardiology   Procedures/Studies: CT ABDOMEN PELVIS WO CONTRAST Result Date: 02/09/2024 CLINICAL DATA:  Gross hematuria. Known uroepithelial cancer. history of prior left nephrectomy, cystoprostatectomy with urinary diversion. * Tracking  Code: BO * EXAM: CT ABDOMEN AND PELVIS WITHOUT CONTRAST TECHNIQUE: Multidetector CT imaging of the abdomen and pelvis was performed following the standard protocol without IV contrast. RADIATION DOSE REDUCTION: This exam was performed according to the departmental dose-optimization program which includes automated exposure control, adjustment of the mA and/or kV according to patient size and/or use of iterative reconstruction technique. COMPARISON:  CT scan 03/12/2022 FINDINGS: Lower chest: Moderate-sized right pleural effusion with overlying atelectasis. No worrisome pulmonary lesions or pulmonary nodules. Stable aortic calcifications. Pacer wires are noted. Hepatobiliary: There are at least 4 new hepatic lesions. 17 mm lesion at the right hepatic dome in segment 8. Anterior segment 3 lesion on image 21/3 measures 15 mm. Slightly more medially and inferiorly is a second lesion measuring 10 mm. There is also a lesion and segment 5 peripherally measuring 13 mm. Findings consistent with metastatic disease. There are also small cysts which are stable. No intrahepatic biliary dilatation. Calcified gallstones are noted in a contracted gallbladder. No common bile duct dilatation. Pancreas: No mass, inflammation or ductal dilatation. Spleen: Normal size.  No focal lesions. Adrenals/Urinary Tract: The adrenal glands are normal. The left kidney is surgically absent. The right kidney demonstrates hydronephrosis and duplicated collecting system. Extensive high attenuation material in the collecting system and in the upper left ureter with masslike area measuring a maximum of 3 cm. Findings are progressive when compared to the prior study and are consistent with uroepithelial neoplasm. Some of this could also be blood/hematoma. Status post cysto prostatectomy with a right-sided urinary diversion. No complicating features. Stomach/Bowel: The stomach, duodenum, small bowel and colon are grossly normal. No obvious mass lesions or  obstructive findings. Patient is a very large anterior abdominal wall hernia containing a significant amount of bowel but no findings for incarceration or obstruction. Vascular/Lymphatic: Advanced atherosclerotic calcification involving aorta, iliac arteries and branch vessels. New retroperitoneal lymphadenopathy. Right-sided node on image 29/3 measures 15 mm and left-sided node on image 32/3 measures 12 mm. No pelvic adenopathy. There is an enlarged right inguinal node measuring 17 mm. Reproductive: The prostate gland and seminal vesicles are surgically absent. Other: None Musculoskeletal: No definite lytic or sclerotic bone lesions to suggest metastatic disease. Healing/healed right seventh, eighth and ninth rib fractures. IMPRESSION: 1. Status post left nephrectomy and cystoprostatectomy with a right-sided urinary diversion. No complicating features. 2. Progressive high attenuation material in the right renal collecting system and upper right ureter with masslike area measuring a maximum of 3 cm. 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 Some of this could also be blood/hematoma. 3. New retroperitoneal and right inguinal lymphadenopathy consistent with metastatic disease. 4. New hepatic lesions consistent with metastatic disease. 5. Moderate-sized right pleural effusion with overlying atelectasis. 6. Cholelithiasis. 7. Very large anterior abdominal wall hernia containing a significant amount of bowel  but no findings for incarceration or obstruction. 8. Healing/healed right seventh, eighth and ninth rib fractures. 9. Aortic atherosclerosis. Aortic Atherosclerosis (ICD10-I70.0). Electronically Signed   By: MYRTIS Stammer M.D.   On: 02/09/2024 18:52   ECHOCARDIOGRAM COMPLETE Result Date: 02/08/2024    ECHOCARDIOGRAM REPORT   Patient Name:   Jordan Oconnell Date of Exam: 02/08/2024 Medical Rec #:  [id number]         Height:       73.0 in Accession #:    [id number]        Weight:       190.6 lb Date of  Birth:  1940/02/28         BSA:          2.108 m Patient Age:    83 years          BP:           95/51 mmHg Patient Gender: M                 HR:           65 bpm. Exam Location:  [address] Procedure: 2D Echo, Cardiac Doppler and Color Doppler (Both Spectral and Color            Flow Doppler were utilized during procedure). Indications:    I35.1 Nonrheumatic aortic (valve) insufficiency  History:        Patient has prior history of Echocardiogram examinations, most                 recent 03/06/2023. Risk Factors:Hypertension.                 Aortic Valve: 29 mm Edwards Sapien prosthetic, stented (TAVR)                 valve is present in the aortic position. Procedure Date:                 01/27/2021.  Sonographer:    Augustin Seals RDCS Referring Phys: [phone] MIHAI CROITORU IMPRESSIONS  1. Left ventricular ejection fraction, by estimation, is 50 to 55%. The left ventricle has low normal function. The left ventricle has no regional wall motion abnormalities. There is moderate left ventricular hypertrophy. Left ventricular diastolic parameters are consistent with Grade I diastolic dysfunction (impaired relaxation).  2. Right ventricular systolic function is normal. The right ventricular size is mildly enlarged.  3. Left atrial size was moderately dilated.  4. The mitral valve is normal in structure. Mild mitral valve regurgitation. No evidence of mitral stenosis.  5. The aortic valve has been repaired/replaced. Aortic valve regurgitation is not visualized. No aortic stenosis is present. There is a 29 mm Edwards Sapien prosthetic, stented Sapien prosthetic (TAVR) valve present in the aortic position. Procedure Date: 01/27/2021. Echo findings are consistent with normal structure and function of the aortic valve prosthesis. Aortic valve mean gradient measures 13.0 mmHg. Aortic valve Vmax measures 2.53 m/s.  6. The inferior vena cava is normal in size with greater than 50% respiratory variability, suggesting right atrial  pressure of 3 mmHg. FINDINGS  Left Ventricle: Left ventricular ejection fraction, by estimation, is 50 to 55%. The left ventricle has low normal function. The left ventricle has no regional wall motion abnormalities. The left ventricular internal cavity size was normal in size. There is moderate left ventricular hypertrophy. Left ventricular diastolic parameters are consistent with Grade I diastolic dysfunction (impaired relaxation). Right Ventricle: The right ventricular size is mildly enlarged.  No increase in right ventricular wall thickness. Right ventricular systolic function is normal. Left Atrium: Left atrial size was moderately dilated. Right Atrium: Right atrial size was normal in size. Pericardium: There is no evidence of pericardial effusion. Mitral Valve: The mitral valve is normal in structure. Mild mitral valve regurgitation. No evidence of mitral valve stenosis. Tricuspid Valve: The tricuspid valve is normal in structure. Tricuspid valve regurgitation is trivial. No evidence of tricuspid stenosis. Aortic Valve: The aortic valve has been repaired/replaced. Aortic valve regurgitation is not visualized. No aortic stenosis is present. Aortic valve mean gradient measures 13.0 mmHg. Aortic valve peak gradient measures 25.6 mmHg. Aortic valve area, by VTI measures 2.04 cm. There is a 29 mm Edwards Sapien prosthetic, stented Sapien prosthetic, stented (TAVR) valve present in the aortic position. Procedure Date: 01/27/2021. Echo findings are consistent with normal structure and function of the aortic valve prosthesis. Pulmonic Valve: The pulmonic valve was normal in structure. Pulmonic valve regurgitation is not visualized. No evidence of pulmonic stenosis. Aorta: The aortic root is normal in size and structure. Venous: The inferior vena cava is normal in size with greater than 50% respiratory variability, suggesting right atrial pressure of 3 mmHg. IAS/Shunts: No atrial level shunt detected by color flow  Doppler. Additional Comments: A device lead is visualized in the right ventricle.  LEFT VENTRICLE PLAX 2D LVIDd:         4.95 cm      Diastology LVIDs:         3.35 cm      LV e' medial:   5.92 cm/s LV PW:         1.30 cm      LV E/e' medial: 13.9 LV IVS:        1.45 cm LVOT diam:     2.40 cm LV SV:         108 LV SV Index:   51 LVOT Area:     4.52 cm  LV Volumes (MOD) LV vol d, MOD A2C: 128.0 ml LV vol d, MOD A4C: 99.6 ml LV vol s, MOD A2C: 56.9 ml LV vol s, MOD A4C: 50.8 ml LV SV MOD A2C:     71.1 ml LV SV MOD A4C:     99.6 ml LV SV MOD BP:      61.8 ml RIGHT VENTRICLE RV Basal diam:  4.50 cm     PULMONARY VEINS RV Mid diam:    3.60 cm     A Reversal Velocity: 32.30 cm/s RV S prime:     13.40 cm/s  Diastolic Velocity:  48.80 cm/s TAPSE (M-mode): 2.7 cm      S/D Velocity:        1.20                             Systolic Velocity:   60.30 cm/s LEFT ATRIUM             Index        RIGHT ATRIUM           Index LA diam:        4.60 cm 2.18 cm/m   RA Area:     17.60 cm LA Vol (A2C):   70.4 ml 33.40 ml/m  RA Volume:   45.90 ml  21.77 ml/m LA Vol (A4C):   86.4 ml 40.99 ml/m LA Biplane Vol: 81.2 ml 38.52 ml/m  AORTIC VALVE AV Area (Vmax):  2.02 cm AV Area (Vmean):   2.14 cm AV Area (VTI):     2.04 cm AV Vmax:           253.00 cm/s AV Vmean:          165.000 cm/s AV VTI:            0.527 m AV Peak Grad:      25.6 mmHg AV Mean Grad:      13.0 mmHg LVOT Vmax:         113.00 cm/s LVOT Vmean:        78.100 cm/s LVOT VTI:          0.238 m LVOT/AV VTI ratio: 0.45  AORTA Ao Root diam: 4.30 cm Ao Asc diam:  4.30 cm MITRAL VALVE MV Area (PHT): 3.48 cm     SHUNTS MV Decel Time: 218 msec     Systemic VTI:  0.24 m MV E velocity: 82.30 cm/s   Systemic Diam: 2.40 cm MV A velocity: 112.50 cm/s MV E/A ratio:  0.73 Oneil Parchment MD Electronically signed by Oneil Parchment MD Signature Date/Time: 02/08/2024/2:10:24 PM    Final       Subjective: Patient seen and examined at bedside today.  Hemodynamically stable.  Urine in the  Foley bag still red but is slowly clearing.  Hemoglobin stable.  He does not have any complaints today and he is eager to go home.  Medically stable for discharge.I called and discussed with his son Mr. Caruth about discharge planning  Discharge Exam: Vitals:   02/23/24 0405 02/23/24 0758  BP: (!) 151/74 (!) 147/70  Pulse: 75 75  Resp: 17 18  Temp: 98.2 F (36.8 C) 97.8 F (36.6 C)  SpO2: 97% 95%   Vitals:   02/22/24 2024 02/23/24 0018 02/23/24 0405 02/23/24 0758  BP: 126/64 129/74 (!) 151/74 (!) 147/70  Pulse: 65 71 75 75  Resp: 17 16 17 18   Temp: 98 F (36.7 C) 98.2 F (36.8 C) 98.2 F (36.8 C) 97.8 F (36.6 C)  TempSrc: Oral Oral Oral Oral  SpO2: 95% 93% 97% 95%  Weight:      Height:        General: Pt is alert, awake, not in acute distress Cardiovascular: RRR, S1/S2 +, no rubs, no gallops Respiratory: CTA bilaterally, no wheezing, no rhonchi Abdominal: Soft, NT, ND, bowel sounds +, urostomy Extremities: no edema, no cyanosis    The results of significant diagnostics from this hospitalization (including imaging, microbiology, ancillary and laboratory) are listed below for reference.     Microbiology: No results found for this or any previous visit (from the past 240 hours).   Labs: BNP (last 3 results) Recent Labs    03/06/23 0939 02/08/24 1201  BNP 151.0* 169.5*   Basic Metabolic Panel: Recent Labs  Lab 02/17/24 0513 02/18/24 0457 02/19/24 0319 02/20/24 0251 02/21/24 0424 02/22/24 0523 02/23/24 0359  NA 139   < > 141 141 140 140 141  K 4.2   < > 4.3 4.3 4.3 4.5 4.5  CL 110   < > 113* 112* 113* 111 112*  CO2 19*   < > 18* 18* 17* 19* 20*  GLUCOSE 96   < > 88 83 90 91 97  BUN 53*   < > 48* 43* 40* 42* 46*  CREATININE 2.91*   < > 2.50* 2.48* 2.28* 2.44* 2.56*  CALCIUM  8.8*   < > 8.3* 8.3* 8.8* 8.9 9.1  MG 2.1  --   --   --   --   --   --    < > =  values in this interval not displayed.   Liver Function Tests: No results for input(s): AST,  ALT, ALKPHOS, BILITOT, PROT, ALBUMIN  in the last 168 hours. No results for input(s): LIPASE, AMYLASE in the last 168 hours. No results for input(s): AMMONIA in the last 168 hours. CBC: Recent Labs  Lab 02/19/24 0319 02/20/24 0251 02/21/24 0424 02/22/24 0523 02/23/24 0359  WBC 3.3* 2.9* 2.9* 3.0* 3.7*  HGB 8.8* 8.4* 8.8* 9.0* 8.9*  HCT 27.7* 27.1* 28.0* 28.3* 28.8*  MCV 96.9 96.1 94.9 93.4 93.8  PLT 98* 104* 114* 127* 126*   Cardiac Enzymes: No results for input(s): CKTOTAL, CKMB, CKMBINDEX, TROPONINI in the last 168 hours. BNP: Invalid input(s): POCBNP CBG: No results for input(s): GLUCAP in the last 168 hours. D-Dimer No results for input(s): DDIMER in the last 72 hours. Hgb A1c No results for input(s): HGBA1C in the last 72 hours. Lipid Profile No results for input(s): CHOL, HDL, LDLCALC, TRIG, CHOLHDL, LDLDIRECT in the last 72 hours. Thyroid  function studies No results for input(s): TSH, T4TOTAL, T3FREE, THYROIDAB in the last 72 hours.  Invalid input(s): FREET3 Anemia work up No results for input(s): VITAMINB12, FOLATE, FERRITIN, TIBC, IRON, RETICCTPCT in the last 72 hours. Urinalysis    Component Value Date/Time   COLORURINE RED (A) 02/09/2024 1633   APPEARANCEUR TURBID (A) 02/09/2024 1633   LABSPEC  02/09/2024 1633    TEST NOT REPORTED DUE TO COLOR INTERFERENCE OF URINE PIGMENT   PHURINE  02/09/2024 1633    TEST NOT REPORTED DUE TO COLOR INTERFERENCE OF URINE PIGMENT   GLUCOSEU (A) 02/09/2024 1633    TEST NOT REPORTED DUE TO COLOR INTERFERENCE OF URINE PIGMENT   HGBUR (A) 02/09/2024 1633    TEST NOT REPORTED DUE TO COLOR INTERFERENCE OF URINE PIGMENT   BILIRUBINUR (A) 02/09/2024 1633    TEST NOT REPORTED DUE TO COLOR INTERFERENCE OF URINE PIGMENT   KETONESUR (A) 02/09/2024 1633    TEST NOT REPORTED DUE TO COLOR INTERFERENCE OF URINE PIGMENT   PROTEINUR (A) 02/09/2024 1633    TEST NOT REPORTED  DUE TO COLOR INTERFERENCE OF URINE PIGMENT   UROBILINOGEN 0.2 07/17/2013 1146   NITRITE (A) 02/09/2024 1633    TEST NOT REPORTED DUE TO COLOR INTERFERENCE OF URINE PIGMENT   LEUKOCYTESUR (A) 02/09/2024 1633    TEST NOT REPORTED DUE TO COLOR INTERFERENCE OF URINE PIGMENT   Sepsis Labs Recent Labs  Lab 02/20/24 0251 02/21/24 0424 02/22/24 0523 02/23/24 0359  WBC 2.9* 2.9* 3.0* 3.7*   Microbiology No results found for this or any previous visit (from the past 240 hours).  Please note: You were cared for by a hospitalist during your hospital stay. Once you are discharged, your primary care physician will handle any further medical issues. Please note that NO REFILLS for any discharge medications will be authorized once you are discharged, as it is imperative that you return to your primary care physician (or establish a relationship with a primary care physician if you do not have one) for your post hospital discharge needs so that they can reassess your need for medications and monitor your lab values.    Time coordinating discharge: 40 minutes  SIGNED:   Ivonne Mustache, MD  [facility] 02/23/2024, 10:33 AM Pager [phone]  If 7PM-7AM, please contact night-coverage [email or web address] Password [email or web address]    [1]  Allergies Allergen Reactions   Amoxicillin Other (See Comments)    Acute interstitial nephritis   Doxycycline Hives   Flomax [Tamsulosin Hcl] Other (See  Comments)    Dizziness

## 2024-02-23 NOTE — Plan of Care (Signed)
" °  Problem: Clinical Measurements: Goal: Cardiovascular complication will be avoided Outcome: Progressing   Problem: Clinical Measurements: Goal: Ability to maintain clinical measurements within normal limits will improve Outcome: Progressing   Problem: Clinical Measurements: Goal: Respiratory complications will improve Outcome: Progressing   Problem: Clinical Measurements: Goal: Diagnostic test results will improve Outcome: Progressing   Problem: Activity: Goal: Risk for activity intolerance will decrease Outcome: Progressing   Problem: Clinical Measurements: Goal: Cardiovascular complication will be avoided Outcome: Progressing   Problem: Activity: Goal: Risk for activity intolerance will decrease Outcome: Progressing   "

## 2024-02-23 NOTE — Progress Notes (Signed)
" ° °  °  Subjective: NAEON.  Light to medium red blood-tinged urine on rounds.  No clots in tubing.    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   7d ceftriaxone  today. Unlikely to be hemorrhagic UTI.  Some clot material in the shape of ureteral cast.  CT A/P again identifies 3 cm renal mass representing known carcinoma, presumed source of bleeding.   Trend H&H.  Transfuse < 7.0. improved Hgb for last 4 checks.  Hgb stable for last 4 checks.    Continue holding Eliquis  if medically reasonable.   No change to plan.  Patient has been in conference with his oncologist at [facility] and will be following up closely.  Cleared for discharge from urologic perspective.  Please call with questions  Intake/Output from previous day: 01/28 0701 - 01/29 0700 In: 495 [P.O.:495] Out: 1300 [Urine:1300]  Intake/Output this shift: No intake/output data recorded.  Physical Exam:  General: sleeping CV: No cyanosis Lungs: equal chest rise Gu: RLQ ileal conduit/urostomy draining clear light red urine.  Lab Results: Recent Labs    02/21/24 0424 02/22/24 0523 02/23/24 0359  HGB 8.8* 9.0* 8.9*  HCT 28.0* 28.3* 28.8*   BMET Recent Labs    02/22/24 0523 02/23/24 0359  NA 140 141  K 4.5 4.5  CL 111 112*  CO2 19* 20*  GLUCOSE 91 97  BUN 42* 46*  CREATININE 2.44* 2.56*  CALCIUM  8.9 9.1  HGB 9.0* 8.9*  WBC 3.0* 3.7*     Studies/Results: No results found.    LOS: 13 days   Ole Bourdon, NP [facility] Pager: [phone]  02/23/2024, 8:29  AM   "

## 2024-02-23 NOTE — Care Management Important Message (Signed)
 Important Message  Patient Details  Name: Jordan Oconnell MRN: [id number] Date of Birth: 30-Sep-1940   Important Message Given:  Yes - Medicare IM     [name] Sharps 02/23/2024, 11:12 AM

## 2024-02-23 NOTE — Plan of Care (Signed)
" °  Problem: Clinical Measurements: Goal: Ability to maintain clinical measurements within normal limits will improve 02/23/2024 1037 by Emil Roselie SAUNDERS, RN Outcome: Adequate for Discharge 02/23/2024 1037 by Emil Roselie SAUNDERS, RN Outcome: Adequate for Discharge Goal: Diagnostic test results will improve 02/23/2024 1037 by Emil Roselie SAUNDERS, RN Outcome: Adequate for Discharge 02/23/2024 1037 by Emil Roselie SAUNDERS, RN Outcome: Adequate for Discharge Goal: Respiratory complications will improve 02/23/2024 1037 by Emil Roselie SAUNDERS, RN Outcome: Adequate for Discharge 02/23/2024 1037 by Emil Roselie SAUNDERS, RN Outcome: Adequate for Discharge Goal: Cardiovascular complication will be avoided 02/23/2024 1037 by Emil Roselie SAUNDERS, RN Outcome: Adequate for Discharge 02/23/2024 1037 by Emil Roselie SAUNDERS, RN Outcome: Adequate for Discharge   Problem: Activity: Goal: Risk for activity intolerance will decrease 02/23/2024 1037 by Emil Roselie SAUNDERS, RN Outcome: Adequate for Discharge 02/23/2024 1037 by Emil Roselie SAUNDERS, RN Outcome: Adequate for Discharge   Problem: Elimination: Goal: Will not experience complications related to bowel motility 02/23/2024 1037 by Emil Roselie SAUNDERS, RN Outcome: Adequate for Discharge 02/23/2024 1037 by Emil Roselie SAUNDERS, RN Outcome: Adequate for Discharge   "

## 2024-02-24 ENCOUNTER — Encounter: Payer: Self-pay | Admitting: Cardiovascular Disease

## 2024-02-27 ENCOUNTER — Emergency Department (HOSPITAL_COMMUNITY)

## 2024-02-27 ENCOUNTER — Other Ambulatory Visit: Payer: Self-pay

## 2024-02-27 ENCOUNTER — Encounter: Payer: Self-pay | Admitting: Cardiovascular Disease

## 2024-02-27 ENCOUNTER — Encounter (HOSPITAL_COMMUNITY): Payer: Self-pay

## 2024-02-27 ENCOUNTER — Observation Stay (HOSPITAL_COMMUNITY)
Admission: EM | Admit: 2024-02-27 | Disposition: A | Discharge status: Skilled Nursing Facility | Source: Home / Self Care | Attending: Internal Medicine | Admitting: Internal Medicine

## 2024-02-27 DIAGNOSIS — N1832 Chronic kidney disease, stage 3b: Secondary | ICD-10-CM | POA: Diagnosis not present

## 2024-02-27 DIAGNOSIS — R7989 Other specified abnormal findings of blood chemistry: Secondary | ICD-10-CM | POA: Diagnosis present

## 2024-02-27 DIAGNOSIS — N184 Chronic kidney disease, stage 4 (severe): Secondary | ICD-10-CM | POA: Diagnosis present

## 2024-02-27 DIAGNOSIS — J90 Pleural effusion, not elsewhere classified: Secondary | ICD-10-CM | POA: Diagnosis present

## 2024-02-27 DIAGNOSIS — C679 Malignant neoplasm of bladder, unspecified: Secondary | ICD-10-CM | POA: Diagnosis not present

## 2024-02-27 DIAGNOSIS — R319 Hematuria, unspecified: Secondary | ICD-10-CM | POA: Diagnosis not present

## 2024-02-27 DIAGNOSIS — N179 Acute kidney failure, unspecified: Principal | ICD-10-CM | POA: Diagnosis present

## 2024-02-27 DIAGNOSIS — C7800 Secondary malignant neoplasm of unspecified lung: Secondary | ICD-10-CM | POA: Diagnosis not present

## 2024-02-27 DIAGNOSIS — R8271 Bacteriuria: Secondary | ICD-10-CM | POA: Diagnosis present

## 2024-02-27 DIAGNOSIS — I4891 Unspecified atrial fibrillation: Secondary | ICD-10-CM | POA: Diagnosis present

## 2024-02-27 DIAGNOSIS — I2510 Atherosclerotic heart disease of native coronary artery without angina pectoris: Secondary | ICD-10-CM | POA: Diagnosis present

## 2024-02-27 DIAGNOSIS — Z952 Presence of prosthetic heart valve: Secondary | ICD-10-CM

## 2024-02-27 DIAGNOSIS — E860 Dehydration: Secondary | ICD-10-CM | POA: Diagnosis not present

## 2024-02-27 DIAGNOSIS — D631 Anemia in chronic kidney disease: Secondary | ICD-10-CM

## 2024-02-27 DIAGNOSIS — D649 Anemia, unspecified: Secondary | ICD-10-CM | POA: Diagnosis present

## 2024-02-27 DIAGNOSIS — E039 Hypothyroidism, unspecified: Secondary | ICD-10-CM | POA: Diagnosis not present

## 2024-02-27 LAB — COMPREHENSIVE METABOLIC PANEL WITH GFR
ALT: 47 U/L — ABNORMAL HIGH (ref 0–44)
AST: 41 U/L (ref 15–41)
Albumin: 3.2 g/dL — ABNORMAL LOW (ref 3.5–5.0)
Alkaline Phosphatase: 72 U/L (ref 38–126)
Anion gap: 12 (ref 5–15)
BUN: 57 mg/dL — ABNORMAL HIGH (ref 8–23)
CO2: 21 mmol/L — ABNORMAL LOW (ref 22–32)
Calcium: 8.9 mg/dL (ref 8.9–10.3)
Chloride: 108 mmol/L (ref 98–111)
Creatinine, Ser: 3.27 mg/dL — ABNORMAL HIGH (ref 0.61–1.24)
GFR, Estimated: 18 mL/min — ABNORMAL LOW
Glucose, Bld: 172 mg/dL — ABNORMAL HIGH (ref 70–99)
Potassium: 4.6 mmol/L (ref 3.5–5.1)
Sodium: 141 mmol/L (ref 135–145)
Total Bilirubin: 0.4 mg/dL (ref 0.0–1.2)
Total Protein: 7 g/dL (ref 6.5–8.1)

## 2024-02-27 LAB — URINALYSIS, MICROSCOPIC (REFLEX): RBC / HPF: >50 RBC/hpf (ref 0–5)

## 2024-02-27 LAB — PRO BRAIN NATRIURETIC PEPTIDE: Pro Brain Natriuretic Peptide: 950 pg/mL — ABNORMAL HIGH

## 2024-02-27 LAB — TYPE AND SCREEN
ABO/RH(D): O POS
Antibody Screen: NEGATIVE

## 2024-02-27 LAB — CBC
HCT: 33.9 % — ABNORMAL LOW (ref 39.0–52.0)
Hemoglobin: 9.9 g/dL — ABNORMAL LOW (ref 13.0–17.0)
MCH: 28.5 pg (ref 26.0–34.0)
MCHC: 29.2 g/dL — ABNORMAL LOW (ref 30.0–36.0)
MCV: 97.7 fL (ref 80.0–100.0)
Platelets: 124 10*3/uL — ABNORMAL LOW (ref 150–400)
RBC: 3.47 MIL/uL — ABNORMAL LOW (ref 4.22–5.81)
RDW: 16.5 % — ABNORMAL HIGH (ref 11.5–15.5)
WBC: 6.9 10*3/uL (ref 4.0–10.5)
nRBC: 0 % (ref 0.0–0.2)

## 2024-02-27 LAB — URINALYSIS, ROUTINE W REFLEX MICROSCOPIC

## 2024-02-27 LAB — TROPONIN T, HIGH SENSITIVITY
Troponin T High Sensitivity: 97 ng/L — ABNORMAL HIGH (ref 0–19)
Troponin T High Sensitivity: 96 ng/L — ABNORMAL HIGH (ref 0–19)

## 2024-02-27 MED ORDER — LACTATED RINGERS IV BOLUS
1000.0000 mL | Freq: Once | INTRAVENOUS | Status: AC
  Administered 2024-02-27: 1000 mL via INTRAVENOUS

## 2024-02-27 NOTE — Assessment & Plan Note (Signed)
-  chronic avoid nephrotoxic medications such as NSAIDs, Vanco Zosyn combo,  avoid hypotension, continue to follow renal function

## 2024-02-27 NOTE — ED Notes (Signed)
Main lab called to add on urine culture.

## 2024-02-27 NOTE — Assessment & Plan Note (Signed)
 Hemoglobin stable continue to monitor Transfuse as needed if continues to drop

## 2024-02-27 NOTE — Assessment & Plan Note (Signed)
 Continue to hold Eliquis  Continue amiodarone  And pressure allows continue Coreg  12.5 micros p.o. twice daily

## 2024-02-27 NOTE — Assessment & Plan Note (Signed)
-   Check TSH continue home medications Synthroid at po q day

## 2024-02-27 NOTE — Assessment & Plan Note (Signed)
 At this point evidence of metastatic spread to the liver as well as lungs with probably pleuritic involvement and pleural effusion. Patient is interested in following up closer to home and establishing care with oncology group in [place] Will send email to oncology here They really need a discussion regarding overall plan of care and establishing goals

## 2024-02-27 NOTE — Assessment & Plan Note (Signed)
 Patient minimally symptomatic, will send for culture, hold off on abx for now

## 2024-02-27 NOTE — Assessment & Plan Note (Signed)
 Chronic stable

## 2024-02-27 NOTE — Subjective & Objective (Signed)
 Patient has hx of metastatic  bladder cancer with hematuria, presents with increased fatigue worsening SOB, unable to take care of himself any longer. Still has haematuria. HE feels so tired cannot get out of the bed Found initially hypotensive and in AKI  Gotten IV fluids  CT shows new mets to the Lungs

## 2024-02-27 NOTE — ED Triage Notes (Signed)
 Pt coming in from home where he is reporting dark colored output from his ostomy. Pt was recently admitted [facility] hospital and got 4 blood transfusions during that stay. Pt is hypotensive in triage

## 2024-02-27 NOTE — Assessment & Plan Note (Signed)
 No chest pain No EKG changes In the setting of poor renal function likely demand in the setting of anemia

## 2024-02-27 NOTE — Assessment & Plan Note (Signed)
Gently rehydrate and continue to monitor fluid status

## 2024-02-27 NOTE — Assessment & Plan Note (Signed)
 Denies any chest pain continue Coreg  12.5 mg twice daily continue Lipitor 10 mg daily

## 2024-02-27 NOTE — ED Provider Triage Note (Addendum)
 Emergency Medicine Provider Triage Evaluation Note  Jordan Oconnell , a 84 y.o. male  was evaluated in triage.  Pt complains of worsening shortness of breath and fatigue since discharge from hospital 4 days ago.  Denies falls or injuries.  Reports an episode of right sided chest pain 2 nights ago that resolved quickly.  He does have a pacemaker in place.  Blood pressures at home have been in the 110s over 60s to 70s.  He notes blood from his ostomy site appears to be slowing down since discharge from hospital.  Daughter describes output last night as wine colored; current output is bright red.  Denies palpitations, syncope.  He does report dizziness upon standing quickly.  Daughter attributes initial low blood pressure to the exertion of getting him to the hospital and sitting upright in the lobby for extended period.  Daughter states her father has been whispering more than talking for the past week or 2. Denies fevers, unilateral leg swelling or tenderness. Patient lives alone.  Review of Systems  Positive: Shortness of breath, fatigue, GI bleed, dizziness with standing Negative: Palpitations, syncope, fevers  Physical Exam  BP (!) 83/47   Pulse 76   Temp (!) 97.5 F (36.4 C)   Resp 18   Ht 6' 1 (1.854 m)   Wt 80.7 kg   SpO2 92%   BMI 23.48 kg/m  Gen:   Awake, no distress.  Chronically ill appearing Resp:  Normal effort with clear lung sounds throughout, though he is taking shallow breaths MSK:   Moves extremities without difficulty  Other:  Bright red output in colostomy bag.  Skin is warm and dry without pallor.  Medical Decision Making  Medically screening exam initiated at 3:26 PM.  Appropriate orders placed.  Jordan Oconnell was informed that the remainder of the evaluation will be completed by another provider, this initial triage assessment does not replace that evaluation, and the importance of remaining in the ED until their evaluation is complete.   Rosina Almarie LABOR,  PA-C 02/27/24 1532    Rosina Almarie LABOR, PA-C 02/27/24 1534

## 2024-02-27 NOTE — ED Triage Notes (Signed)
 Pt's daughter states pt is having SOB, fatigue, generalized weakness since being discharge from the hospital recently.

## 2024-02-28 ENCOUNTER — Observation Stay (HOSPITAL_COMMUNITY)

## 2024-02-28 ENCOUNTER — Other Ambulatory Visit: Payer: Self-pay | Admitting: Oncology

## 2024-02-28 ENCOUNTER — Ambulatory Visit

## 2024-02-28 DIAGNOSIS — I350 Nonrheumatic aortic (valve) stenosis: Secondary | ICD-10-CM

## 2024-02-28 DIAGNOSIS — Z923 Personal history of irradiation: Secondary | ICD-10-CM

## 2024-02-28 DIAGNOSIS — I509 Heart failure, unspecified: Secondary | ICD-10-CM | POA: Diagnosis not present

## 2024-02-28 DIAGNOSIS — C679 Malignant neoplasm of bladder, unspecified: Secondary | ICD-10-CM

## 2024-02-28 DIAGNOSIS — I480 Paroxysmal atrial fibrillation: Secondary | ICD-10-CM | POA: Diagnosis not present

## 2024-02-28 DIAGNOSIS — Z9221 Personal history of antineoplastic chemotherapy: Secondary | ICD-10-CM

## 2024-02-28 DIAGNOSIS — C787 Secondary malignant neoplasm of liver and intrahepatic bile duct: Secondary | ICD-10-CM | POA: Diagnosis not present

## 2024-02-28 DIAGNOSIS — N179 Acute kidney failure, unspecified: Secondary | ICD-10-CM

## 2024-02-28 DIAGNOSIS — D649 Anemia, unspecified: Secondary | ICD-10-CM

## 2024-02-28 DIAGNOSIS — N289 Disorder of kidney and ureter, unspecified: Secondary | ICD-10-CM | POA: Diagnosis not present

## 2024-02-28 DIAGNOSIS — I2510 Atherosclerotic heart disease of native coronary artery without angina pectoris: Secondary | ICD-10-CM | POA: Diagnosis not present

## 2024-02-28 DIAGNOSIS — J90 Pleural effusion, not elsewhere classified: Secondary | ICD-10-CM | POA: Diagnosis not present

## 2024-02-28 DIAGNOSIS — R319 Hematuria, unspecified: Secondary | ICD-10-CM | POA: Diagnosis not present

## 2024-02-28 DIAGNOSIS — R590 Localized enlarged lymph nodes: Secondary | ICD-10-CM | POA: Diagnosis not present

## 2024-02-28 LAB — BODY FLUID CELL COUNT WITH DIFFERENTIAL
Eos, Fluid: 6 %
Lymphs, Fluid: 10 %
Monocyte-Macrophage-Serous Fluid: 59 % (ref 50–90)
Neutrophil Count, Fluid: 24 % (ref 0–25)
Other Cells, Fluid: 1 %
Total Nucleated Cell Count, Fluid: 870 uL (ref 0–1000)

## 2024-02-28 LAB — COMPREHENSIVE METABOLIC PANEL WITH GFR
ALT: 44 U/L (ref 0–44)
AST: 40 U/L (ref 15–41)
Albumin: 2.9 g/dL — ABNORMAL LOW (ref 3.5–5.0)
Alkaline Phosphatase: 64 U/L (ref 38–126)
Anion gap: 10 (ref 5–15)
BUN: 60 mg/dL — ABNORMAL HIGH (ref 8–23)
CO2: 22 mmol/L (ref 22–32)
Calcium: 8.4 mg/dL — ABNORMAL LOW (ref 8.9–10.3)
Chloride: 108 mmol/L (ref 98–111)
Creatinine, Ser: 3.02 mg/dL — ABNORMAL HIGH (ref 0.61–1.24)
GFR, Estimated: 20 mL/min — ABNORMAL LOW
Glucose, Bld: 95 mg/dL (ref 70–99)
Potassium: 4.2 mmol/L (ref 3.5–5.1)
Sodium: 140 mmol/L (ref 135–145)
Total Bilirubin: 0.4 mg/dL (ref 0.0–1.2)
Total Protein: 5.4 g/dL — ABNORMAL LOW (ref 6.5–8.1)

## 2024-02-28 LAB — CBC
HCT: 25.9 % — ABNORMAL LOW (ref 39.0–52.0)
Hemoglobin: 8 g/dL — ABNORMAL LOW (ref 13.0–17.0)
MCH: 28.7 pg (ref 26.0–34.0)
MCHC: 30.9 g/dL (ref 30.0–36.0)
MCV: 92.8 fL (ref 80.0–100.0)
Platelets: 114 10*3/uL — ABNORMAL LOW (ref 150–400)
RBC: 2.79 MIL/uL — ABNORMAL LOW (ref 4.22–5.81)
RDW: 16.5 % — ABNORMAL HIGH (ref 11.5–15.5)
WBC: 4.3 10*3/uL (ref 4.0–10.5)
nRBC: 0 % (ref 0.0–0.2)

## 2024-02-28 LAB — MAGNESIUM: Magnesium: 1.9 mg/dL (ref 1.7–2.4)

## 2024-02-28 LAB — PHOSPHORUS: Phosphorus: 3.2 mg/dL (ref 2.5–4.6)

## 2024-02-28 LAB — URINE CULTURE: Special Requests: NORMAL

## 2024-02-28 LAB — T4, FREE: Free T4: 2.31 ng/dL — ABNORMAL HIGH (ref 0.80–2.00)

## 2024-02-28 LAB — VITAMIN D 25 HYDROXY (VIT D DEFICIENCY, FRACTURES): Vit D, 25-Hydroxy: 9.6 ng/mL — ABNORMAL LOW (ref 30–100)

## 2024-02-28 LAB — TSH: TSH: 0.677 u[IU]/mL (ref 0.350–4.500)

## 2024-02-28 MED ORDER — SODIUM CHLORIDE 0.9 % IV SOLN: INTRAVENOUS | Status: AC

## 2024-02-28 MED ORDER — SODIUM BICARBONATE 650 MG PO TABS
1300.0000 mg | ORAL_TABLET | Freq: Two times a day (BID) | ORAL | Status: AC
  Administered 2024-02-28 – 2024-03-02 (×9): 1300 mg via ORAL
  Filled 2024-02-28 (×9): qty 2

## 2024-02-28 MED ORDER — AMIODARONE HCL 200 MG PO TABS
200.0000 mg | ORAL_TABLET | Freq: Every day | ORAL | Status: AC
  Administered 2024-02-28 – 2024-03-02 (×4): 200 mg via ORAL
  Filled 2024-02-28 (×4): qty 1

## 2024-02-28 MED ORDER — ALBUTEROL SULFATE (2.5 MG/3ML) 0.083% IN NEBU: 2.5000 mg | INHALATION_SOLUTION | RESPIRATORY_TRACT | Status: DC | PRN

## 2024-02-28 MED ORDER — HYDROCODONE-ACETAMINOPHEN 5-325 MG PO TABS: 1.0000 | ORAL_TABLET | ORAL | Status: AC | PRN

## 2024-02-28 MED ORDER — ACETAMINOPHEN 325 MG PO TABS: 650.0000 mg | ORAL_TABLET | Freq: Four times a day (QID) | ORAL | Status: AC | PRN

## 2024-02-28 MED ORDER — ACETAMINOPHEN 650 MG RE SUPP: 650.0000 mg | Freq: Four times a day (QID) | RECTAL | Status: AC | PRN

## 2024-02-28 MED ORDER — LIDOCAINE-EPINEPHRINE 1 %-1:100000 IJ SOLN
20.0000 mL | Freq: Once | INTRAMUSCULAR | Status: AC
  Administered 2024-02-28: 10 mL via INTRADERMAL

## 2024-02-28 MED ORDER — ENSURE PLUS HIGH PROTEIN PO LIQD
237.0000 mL | Freq: Two times a day (BID) | ORAL | Status: AC
  Administered 2024-02-28 – 2024-03-02 (×5): 237 mL via ORAL

## 2024-02-28 MED ORDER — METOPROLOL TARTRATE 5 MG/5ML IV SOLN: 5.0000 mg | INTRAVENOUS | Status: AC | PRN

## 2024-02-28 MED ORDER — LIDOCAINE-EPINEPHRINE 1 %-1:100000 IJ SOLN
INTRAMUSCULAR | Status: AC
  Filled 2024-02-28: qty 20

## 2024-02-28 MED ORDER — THIAMINE MONONITRATE 100 MG PO TABS
100.0000 mg | ORAL_TABLET | Freq: Every day | ORAL | Status: AC
  Administered 2024-02-28 – 2024-03-02 (×4): 100 mg via ORAL
  Filled 2024-02-28 (×4): qty 1

## 2024-02-28 MED ORDER — ADULT MULTIVITAMIN W/MINERALS CH
1.0000 | ORAL_TABLET | Freq: Every day | ORAL | Status: AC
  Administered 2024-02-28 – 2024-03-02 (×4): 1 via ORAL
  Filled 2024-02-28 (×4): qty 1

## 2024-02-28 MED ORDER — CARVEDILOL 12.5 MG PO TABS
12.5000 mg | ORAL_TABLET | Freq: Two times a day (BID) | ORAL | Status: AC
  Administered 2024-02-28 – 2024-03-02 (×9): 12.5 mg via ORAL
  Filled 2024-02-28 (×9): qty 1

## 2024-02-28 MED ORDER — ONDANSETRON HCL 4 MG/2ML IJ SOLN: 4.0000 mg | Freq: Four times a day (QID) | INTRAMUSCULAR | Status: AC | PRN

## 2024-02-28 MED ORDER — MIRTAZAPINE 15 MG PO TABS
7.5000 mg | ORAL_TABLET | Freq: Every day | ORAL | Status: AC
  Administered 2024-02-28 – 2024-03-02 (×5): 7.5 mg via ORAL
  Filled 2024-02-28 (×5): qty 1

## 2024-02-28 MED ORDER — IPRATROPIUM-ALBUTEROL 0.5-2.5 (3) MG/3ML IN SOLN: 3.0000 mL | RESPIRATORY_TRACT | Status: AC | PRN

## 2024-02-28 MED ORDER — HYDRALAZINE HCL 20 MG/ML IJ SOLN: 10.0000 mg | INTRAMUSCULAR | Status: AC | PRN

## 2024-02-28 MED ORDER — LEVOTHYROXINE SODIUM 100 MCG PO TABS
200.0000 ug | ORAL_TABLET | Freq: Every day | ORAL | Status: AC
  Administered 2024-02-28 – 2024-03-02 (×4): 200 ug via ORAL
  Filled 2024-02-28 (×4): qty 2

## 2024-02-28 MED ORDER — ATORVASTATIN CALCIUM 10 MG PO TABS
10.0000 mg | ORAL_TABLET | Freq: Every day | ORAL | Status: AC
  Administered 2024-02-28 – 2024-03-02 (×5): 10 mg via ORAL
  Filled 2024-02-28 (×5): qty 1

## 2024-02-28 MED ORDER — GUAIFENESIN 100 MG/5ML PO LIQD: 5.0000 mL | ORAL | Status: AC | PRN

## 2024-02-28 MED ORDER — SENNOSIDES-DOCUSATE SODIUM 8.6-50 MG PO TABS: 1.0000 | ORAL_TABLET | Freq: Every evening | ORAL | Status: AC | PRN

## 2024-02-28 MED ORDER — VITAMIN D (ERGOCALCIFEROL) 1.25 MG (50000 UNIT) PO CAPS: 50000.0000 [IU] | ORAL_CAPSULE | ORAL | Status: AC

## 2024-02-28 MED ORDER — ONDANSETRON HCL 4 MG PO TABS: 4.0000 mg | ORAL_TABLET | Freq: Four times a day (QID) | ORAL | Status: AC | PRN

## 2024-02-28 MED ORDER — VITAMIN D (ERGOCALCIFEROL) 1.25 MG (50000 UNIT) PO CAPS
50000.0000 [IU] | ORAL_CAPSULE | ORAL | Status: DC
  Administered 2024-02-28: 50000 [IU] via ORAL
  Filled 2024-02-28: qty 1

## 2024-02-28 NOTE — ED Notes (Signed)
 Jordan Oconnell, daughter [phone] is requesting an update. Please and thank you

## 2024-02-28 NOTE — Progress Notes (Signed)
 VAST consult. MD at bedside. Notified RN to re-consult when pt is available. RN VU. Powell Bowler, RN VAST

## 2024-02-28 NOTE — Evaluation (Signed)
 Physical Therapy Evaluation Patient Details Name: Jordan Oconnell MRN: [id number] DOB: Jun 04, 1940 Today's Date: 02/28/2024  History of Present Illness  Jordan Oconnell is a 84 y.o. male who presented due to significant fatigue. CT shows new mets to the Lungs. PMHx: recent admit 1/15-1/28, bladder cancer/urothelial cancer, s/p cystectomy and partial R nephrectomy, total nephrectomy, GERD, HLD, HTN, hypothyroidism, CKD, tachybradycardia syndrome, anemia, AKI, CKD.  Clinical Impression  Patient seen and performed only bed level evaluation as relates extreme fatigue from overnight admission through ED and now post thoracentesis.  Patient previously ambulatory with difficulty at home (furniture walking per daughter) and not really able to take care of himself since last d/c from hospital stay 1/15-1/29/26.  Currently notable for LE weakness in bed and very limited activity tolerance.  Feel he will benefit from skilled PT in the acute setting and from post-acute inpatient rehab (<3 hours/day) prior to d/c home.         If plan is discharge home, recommend the following: Assistance with cooking/housework;Help with stairs or ramp for entrance;Assist for transportation;A little help with walking and/or transfers;A lot of help with bathing/dressing/bathroom   Can travel by private vehicle   No    Equipment Recommendations None recommended by PT  Recommendations for Other Services       Functional Status Assessment Patient has had a recent decline in their functional status and demonstrates the ability to make significant improvements in function in a reasonable and predictable amount of time.     Precautions / Restrictions Precautions Precautions: Fall Recall of Precautions/Restrictions: Intact      Mobility  Bed Mobility               General bed mobility comments: declined mobility due to fatigue from ED admission overnight and just post thoracentesis,    Transfers                         Ambulation/Gait                  Stairs            Wheelchair Mobility     Tilt Bed    Modified Rankin (Stroke Patients Only)       Balance                                             Pertinent Vitals/Pain Pain Assessment Pain Assessment: No/denies pain    Home Living Family/patient expects to be discharged to:: Private residence Living Arrangements: Alone Available Help at Discharge: Family;Friend(s);Available PRN/intermittently Type of Home: House Home Access: Stairs to enter   Entrance Stairs-Number of Steps: 2   Home Layout: One level Home Equipment: Cane - single point;Grab bars - tub/shower;Grab bars - toilet;Shower Quarry Manager (4 wheels)      Prior Function               Mobility Comments: using SPC in community at baseline; was walking 1 mile a day until recently (last admission) weak since home from admission and declined rapidly, though denies falls (furniture walking)       Extremity/Trunk Assessment   Upper Extremity Assessment Upper Extremity Assessment: Defer to OT evaluation    Lower Extremity Assessment Lower Extremity Assessment: Generalized weakness    Cervical / Trunk Assessment Cervical / Trunk Assessment: Kyphotic  Communication  Communication Communication: Impaired Factors Affecting Communication: Hearing impaired    Cognition Arousal: Alert Behavior During Therapy: WFL for tasks assessed/performed   PT - Cognitive impairments: No apparent impairments                         Following commands: Intact       Cueing Cueing Techniques: Verbal cues     General Comments General comments (skin integrity, edema, etc.): daughter present and supportive though lives in [place] and cannot stay with pt.  They both report plans for rehab prior to further treatment for his cancer.  Noted urostomy on R lower quadrant    Exercises     Assessment/Plan     PT Assessment Patient needs continued PT services  PT Problem List Decreased activity tolerance;Decreased balance;Decreased mobility       PT Treatment Interventions DME instruction;Gait training;Functional mobility training;Therapeutic activities;Therapeutic exercise;Balance training;Manual techniques;Patient/family education    PT Goals (Current goals can be found in the Care Plan section)  Acute Rehab PT Goals Patient Stated Goal: get stronger prior to further treatmentse PT Goal Formulation: With patient/family Time For Goal Achievement: 03/13/24 Potential to Achieve Goals: Good    Frequency Min 2X/week     Co-evaluation               AM-PAC PT 6 Clicks Mobility  Outcome Measure Help needed turning from your back to your side while in a flat bed without using bedrails?: Total (declined mobility) Help needed moving from lying on your back to sitting on the side of a flat bed without using bedrails?: Total Help needed moving to and from a bed to a chair (including a wheelchair)?: Total Help needed standing up from a chair using your arms (e.g., wheelchair or bedside chair)?: Total Help needed to walk in hospital room?: Total Help needed climbing 3-5 steps with a railing? : Total 6 Click Score: 6    End of Session   Activity Tolerance: Patient limited by fatigue Patient left: in bed;with call bell/phone within reach;with family/visitor present   PT Visit Diagnosis: Other abnormalities of gait and mobility (R26.89);Muscle weakness (generalized) (M62.81)    Time: 8498-8488 PT Time Calculation (min) (ACUTE ONLY): 10 min   Charges:   PT Evaluation $PT Eval Moderate Complexity: 1 Mod   PT General Charges $$ ACUTE PT VISIT: 1 Visit         Micheline Portal, PT Acute Rehabilitation Services Office:[phone] 02/28/2024   Montie Portal 02/28/2024, 3:42 PM

## 2024-02-28 NOTE — ED Notes (Signed)
Floor notified.

## 2024-02-28 NOTE — Progress Notes (Signed)
 PT Cancellation Note  Patient Details Name: Jordan Oconnell MRN: [id number] DOB: March 30, 1940   Cancelled Treatment:    Reason Eval/Treat Not Completed: Patient at procedure or test/unavailable; down for thoracentesis, will follow up when back in his room.   Jordan Oconnell 02/28/2024, 2:00 PM Jordan Oconnell, PT Acute Rehabilitation Services Office:[phone] 02/28/2024

## 2024-02-28 NOTE — Progress Notes (Addendum)
 Initial Nutrition Assessment  DOCUMENTATION CODES:   Severe malnutrition in context of chronic illness  INTERVENTION:  Encourage po intake Room service with assist  Ensure Plus High Protein po BID, each supplement provides 350 kcal and 20 grams of protein Mighty Shake TID with meals, each supplement provides 330 kcals and 9 grams of protein - Prefers strawberry  MVI with minerals daily  100 mg Thiamine  daily x 7 days Supplement 50,000 units Vitamin D  once weekly x 8 weeks then begin 2000 units Vitamin D  once daily for maintenance  Recommend checking Zinc  for taste aversions  Provided high calorie and high protein handout and list of high calorie/protein snacks   NUTRITION DIAGNOSIS:   Severe Malnutrition related to cancer and cancer related treatments as evidenced by severe muscle depletion, severe fat depletion, energy intake < 75% for > 7 days.  GOAL:   Patient will meet greater than or equal to 90% of their needs  MONITOR:   PO intake, Supplement acceptance, Labs  REASON FOR ASSESSMENT:   Consult Assessment of nutrition requirement/status  ASSESSMENT:   84 y.o. male with PMH of  HTN, HLD,  GERD,  hypothyroidism, solitary kidney A-fib, CKD IV, CHF, depression, anemia, bladder cancer metastatic, s/p cystoscopy/urostomy via of left total nephrectomy and right partial nephrectomy. Presented with fatigue and SOB.   2/2 - CT shows possibly new mets to lungs, right pleural effusion   Pt resting in bed with daughter at bedside. Pt recently admitted 02/09/2023-02/23/2024  for acute blood loss anemia and AKI. Per pt he was able to do all of his ADL independently before initial admission, cooking, driving, grocery shopping, etc. Pt and daughter state he was eating very well in the hospital during his initial admission however, when pt returned home he became very weak, unable to get out of bed. Was only eating frozen healthy choice meals and drinking 1 Premier protein shake per day  since discharge. Pt also endorses decrease in appetite as food does not taste good. Has been haivng to use a walker/cane recently d/t weakness.   Per weight history, pt's weight has remained stable in the last year, ranging from 190-200 lbs. Since October 2025 pt's weight has declined going from 189 lbs to now 176 lbs. 13 lb, 6.7% weight loss in 3.5 months.  Since initial admission on 02/10/2024, pt has lost 8 lbs, 4% in 2 weeks which is clinically significant. On exam pt with serve muscle and fat wasting. Meets criteria for severe malnutrition.   Discussed increasing calories and protein and provided ways to achieve this. Pt typically drinks premier protein shakes, dicussed switching to Ensure High Portein for added claories and protein. Also discussed in detail with dauhgter ways to increase intake with higher calorie food swaps and snacks. Provided handout. Per daughter pt is a picky eater, prefers strawberry. Encouraged her, if able to purchase Strawberry Ensure outside hospital. Will add Mighty Shakes.   Vitamin D  low, repletion started. Will check micronutrient labs for taste aversions. Add MVI/Thiamine . Started on appetite stimulant. Oncology consulted. MD ordered IR for thoracentesis. Likely stage IV progression of malignancy.  Palliative consulted for GOC.   Admit weight: 80.7 kg Current weight: 80.3 kg   Average Meal Intake: No meals recorded  Nutritionally Relevant Medications: Scheduled Meds:  feeding supplement  237 mL Oral BID BM   levothyroxine   200 mcg Oral QAC breakfast   mirtazapine   7.5 mg Oral QHS   multivitamin with minerals  1 tablet Oral Daily   sodium  bicarbonate  1,300 mg Oral BID   thiamine   100 mg Oral Daily   [START ON 03/06/2024] Vitamin D  (Ergocalciferol )  50,000 Units Oral Q7 days   Labs Reviewed: Sodium 140 Potassium 4.2 BUN 60 Creatinine 3.02 Phosphorus 3.2 Magnesium  1.9 Vitamin D  9.6 CBG ranges from 95-172 mg/dL over the last 24 hours HgbA1c  5.0  NUTRITION - FOCUSED PHYSICAL EXAM:  Flowsheet Row Most Recent Value  Orbital Region Moderate depletion  Upper Arm Region Severe depletion  Thoracic and Lumbar Region Severe depletion  Buccal Region Moderate depletion  Temple Region Moderate depletion  Clavicle Bone Region Moderate depletion  Clavicle and Acromion Bone Region Moderate depletion  Scapular Bone Region Severe depletion  Dorsal Hand Severe depletion  Patellar Region Severe depletion  Anterior Thigh Region Severe depletion  Posterior Calf Region Severe depletion  Edema (RD Assessment) Mild  Hair Reviewed  Eyes Reviewed  Mouth Reviewed  [Dry tongue]  Skin Reviewed  Nails Reviewed    Diet Order:   Diet Order             Diet regular Room service appropriate? Yes with Assist; Fluid consistency: Thin  Diet effective now                   EDUCATION NEEDS:   Education needs have been addressed  Skin:  Skin Assessment: Reviewed RN Assessment  Last BM:  PTA  Height:   Ht Readings from Last 1 Encounters:  02/27/24 6' 1 (1.854 m)    Weight:   Wt Readings from Last 1 Encounters:  02/28/24 80.3 kg    Ideal Body Weight:  83.6 kg  BMI:  Body mass index is 23.36 kg/m.  Estimated Nutritional Needs:   Kcal:  1900-2100 kcal  Protein:  100-120 gm  Fluid:  >1.9L/day   Olivia Kenning, RD Registered Dietitian  See Amion for more information

## 2024-02-29 ENCOUNTER — Encounter: Payer: Self-pay | Admitting: *Deleted

## 2024-02-29 DIAGNOSIS — N179 Acute kidney failure, unspecified: Secondary | ICD-10-CM | POA: Diagnosis not present

## 2024-02-29 LAB — CBC
HCT: 28.2 % — ABNORMAL LOW (ref 39.0–52.0)
Hemoglobin: 8.9 g/dL — ABNORMAL LOW (ref 13.0–17.0)
MCH: 28.7 pg (ref 26.0–34.0)
MCHC: 31.6 g/dL (ref 30.0–36.0)
MCV: 91 fL (ref 80.0–100.0)
Platelets: 118 10*3/uL — ABNORMAL LOW (ref 150–400)
RBC: 3.1 MIL/uL — ABNORMAL LOW (ref 4.22–5.81)
RDW: 16.4 % — ABNORMAL HIGH (ref 11.5–15.5)
WBC: 4.3 10*3/uL (ref 4.0–10.5)
nRBC: 0 % (ref 0.0–0.2)

## 2024-02-29 LAB — BASIC METABOLIC PANEL WITH GFR
Anion gap: 9 (ref 5–15)
BUN: 49 mg/dL — ABNORMAL HIGH (ref 8–23)
CO2: 22 mmol/L (ref 22–32)
Calcium: 8.5 mg/dL — ABNORMAL LOW (ref 8.9–10.3)
Chloride: 111 mmol/L (ref 98–111)
Creatinine, Ser: 2.51 mg/dL — ABNORMAL HIGH (ref 0.61–1.24)
GFR, Estimated: 25 mL/min — ABNORMAL LOW
Glucose, Bld: 98 mg/dL (ref 70–99)
Potassium: 4.5 mmol/L (ref 3.5–5.1)
Sodium: 141 mmol/L (ref 135–145)

## 2024-02-29 LAB — LD, BODY FLUID (OTHER): LD, Body Fluid: 356 [IU]/L

## 2024-02-29 LAB — GLUCOSE, BODY FLUID OTHER: Glucose, Body Fluid Other: 107 mg/dL

## 2024-02-29 LAB — MAGNESIUM: Magnesium: 1.9 mg/dL (ref 1.7–2.4)

## 2024-02-29 LAB — C-REACTIVE PROTEIN: CRP: 10.2 mg/dL — ABNORMAL HIGH

## 2024-02-29 LAB — CYTOLOGY - NON PAP

## 2024-02-29 NOTE — Progress Notes (Signed)
 "                                                                                                                                                        Daily Progress Note   Patient Name: Jordan Oconnell       Date: 02/29/2024 DOB: 01/18/1941  Age: 84 y.o. MRN#: [id number] Attending Physician: Caleen Burgess BROCKS, MD Primary Care Physician: Shepard Ade, MD Admit Date: 02/27/2024  Reason for Consultation/Follow-up: Establishing goals of care  Subjective: Patient is not quite sure whether the thoracentesis has helped him feel better or not, still feeling quite fatigued today.  No pain or dyspnea at this time.  His daughter is present visiting.  Created space and opportunity for patient and family's thoughts and feelings on patient's current illness.  They continue to await further information from oncology after a good first conversation with Dr. Cloretta.  They have a clear understanding of the next steps to anticipate, including pleural effusion fluid cytology results and follow-up with Dr. Raynaldo.  Patient's daughter shares experience with goals of care discussion earlier today with primary attending.  They found this very helpful and completed MOST form together today, indicating that he would want DNR/DNI, IV fluids and IV antibiotics, but no feeding tube.  I reviewed the difference between a MOST form and living will, as well as CODE STATUS.  Objective: Reviewed oncology consult note, hospitalist progress note from today, TOC note.  Labs reviewed.  Creatinine improved from 3.02->2.51. Provided copy of advance directive paperwork.  Questions and concerns addressed. PMT will continue to support holistically.   Physical Exam Vitals and nursing note reviewed.  Constitutional:      General: He is not in acute distress.    Appearance: He is ill-appearing.  HENT:     Head: Normocephalic and atraumatic.  Neurological:     Mental Status: He is alert.             Vital Signs: BP 133/69  (BP Location: Right Arm)   Pulse 70   Temp 98.2 F (36.8 C)   Resp 18   Ht 6' 1 (1.854 m)   Wt 80.3 kg Comment: Bed weight  SpO2 96%   BMI 23.36 kg/m  SpO2: SpO2: 96 % O2 Device: O2 Device: Nasal Cannula O2 Flow Rate: O2 Flow Rate (L/min): 2 L/min      Palliative Assessment/Data: 50%   Palliative Care Assessment & Plan   Patient Profile: 84 y.o. male  with past medical history of HTN, HLD, GERD, hypothyroidism, solitary kidney, A-fib, aortic stenosis, diastolic CHF, depression and anemia, bladder cancer metastatic, status post cystoscopy/urostomy via of left total nephrectomy and right partial nephrectomy,  admitted on 02/27/2024 with fatigue.    Patient is readmitted after discharge less than 7 days ago for hospitalization due to  acute blood loss anemia in the context of hematuria and bladder cancer.  He was found to be hypotensive and with AKI, given fluids.  He is interested in transferring his care from [place] to [place].   PMT has been consulted to assist with goals of care conversation and complex medical decision making.  He is status post thoracentesis of malignant pleural effusion and awaiting results from pleural fluid cytology to determine cancer treatment options.  Assessment: Goals of care conversation Failure to thrive Metastatic bladder cancer  Recommendations/Plan: CODE STATUS changed to DNR/DNI Provided education and counseling on advance directives, explaining availability of notary to assist if necessary.  Patient's daughter will find his existing paperwork and bring it in tomorrow Provided patient and family with copies of MOST form completed with MD earlier today Ongoing goals of care discussions.  Patient expressing his desire to pursue curative treatments PMT will continue to follow and support   Prognosis: Worrisome prognosis with functional decline in the context of metastatic bladder cancer  Discharge Planning: To Be Determined  Care plan was  discussed with patient, patient's daughter   Mickle SHAUNNA Fell, PA-C  Palliative Medicine Team Team phone # [phone]  Thank you for allowing the Palliative Medicine Team to assist in the care of this patient. Please utilize secure chat with additional questions, if there is no response within 30 minutes please call the above phone number.  Palliative Medicine Team providers are available by phone from 7am to 7pm daily and can be reached through the team cell phone.  Should this patient require assistance outside of these hours, please call the patient's attending physician.   I personally spent a total of 35 minutes in the care of the patient today including preparing to see the patient, getting/reviewing separately obtained history, performing a medically appropriate exam/evaluation, counseling and educating, and documenting clinical information in the EHR.  "

## 2024-02-29 NOTE — TOC Progression Note (Signed)
 Transition of Care [facility]) - Progression Note    Patient Details  Name: Jordan Oconnell MRN: [id number] Date of Birth: Jul 30, 1940  [facility]) CM/SW Contact  Lendia Dais, [place] Phone Number: 02/29/2024, 3:05 PM  Clinical Narrative: CSW spoke to the pt and pt's daughter Burnard at bedside and informed both of the PT rec of SNF. Pt was agreeable and would like return to [place] due to be familiar with the facility. CSW stated that they can send out a referral to [facility] but mentioned that bed availability changes and the facility could be full. CSW gained verbal permission to send out referrals to other facilities.  FL2 done, referrals sent out in the HUB. Bed offers pending.  CSW will continue to monitor for offers.                       Expected Discharge Plan and Services                                               Social Drivers of Health (SDOH) Interventions SDOH Screenings   Food Insecurity: No Food Insecurity (02/28/2024)  Housing: Low Risk (02/28/2024)  Transportation Needs: No Transportation Needs (02/28/2024)  Utilities: Not At Risk (02/28/2024)  Social Connections: Unknown (02/28/2024)  Tobacco Use: Low Risk (02/27/2024)    Readmission Risk Interventions    05/04/2023   10:12 AM 03/08/2023   12:36 PM  Readmission Risk Prevention Plan  Transportation Screening Complete Complete  PCP or Specialist Appt within 3-5 Days Complete Complete  HRI or Home Care Consult Complete Complete  Social Work Consult for Recovery Care Planning/Counseling Complete Complete  Palliative Care Screening Not Applicable Not Applicable  Medication Review Oceanographer) Complete Complete

## 2024-02-29 NOTE — Progress Notes (Signed)
 Physical Therapy Treatment Patient Details Name: HUEL CENTOLA MRN: [id number] DOB: [birth year] Today's Date: 02/29/2024   History of Present Illness Ganon TANUSH DREES is a 84 y.o. male who presented due to significant fatigue. CT shows new mets to the Lungs. PMHx: recent admit 1/15-1/28, bladder cancer/urothelial cancer, s/p cystectomy and partial R nephrectomy, total nephrectomy, GERD, HLD, HTN, hypothyroidism, CKD, tachybradycardia syndrome, anemia, AKI, CKD.    PT Comments  Pt greeted supine in bed, pleasant and agreeable to PT session. He engaged in bed mobility and transfers using RW with minA. Pt continues to be limited by impaired cardiopulmonary endurance, generalized weakness, and decreased activity tolerance. He ambulated a short-distance within his room. Unable to participate in LE exercises or maintain upright posture in chair d/t fatigue. Will continue to follow acutely and advance appropriately.      If plan is discharge home, recommend the following: Assistance with cooking/housework;Help with stairs or ramp for entrance;Assist for transportation;A little help with walking and/or transfers;A lot of help with bathing/dressing/bathroom   Can travel by private vehicle     Yes  Equipment Recommendations  None recommended by PT    Recommendations for Other Services       Precautions / Restrictions Precautions Precautions: Fall Recall of Precautions/Restrictions: Intact Restrictions Weight Bearing Restrictions Per Provider Order: No     Mobility  Bed Mobility Overal bed mobility: Needs Assistance Bed Mobility: Supine to Sit     Supine to sit: Min assist, HOB elevated, Used rails     General bed mobility comments: Pt sat up on R side of bed with increased time. Cues for sequencing. Pt utilized bed rail. Slowly brought BLE off EOB. Assist to elevate trunk. Scooted fwd til feet flat.    Transfers Overall transfer level: Needs assistance Equipment used: Rolling  walker (2 wheels) Transfers: Sit to/from Stand, Bed to chair/wheelchair/BSC Sit to Stand: Min assist   Step pivot transfers: Min assist       General transfer comment: Pt stood from lowest bed height. Cued proper hand placement using RW. Powered up with minA. Transferred to recliner chair. Fair eccentric control.    Ambulation/Gait Ambulation/Gait assistance: Min assist Gait Distance (Feet): 8 Feet Assistive device: Rolling walker (2 wheels) Gait Pattern/deviations: Step-through pattern, Decreased stride length, Trunk flexed Gait velocity: decr     General Gait Details: Pt ambulated with short slow steps within the room to transfer to recliner chair. He demonstrated minimal foot clearence and fwd lean. Assist to navigate room. Cued PLB technique. No overt LOB. Distance limited d/t fatigue and weakness.   Stairs             Wheelchair Mobility     Tilt Bed    Modified Rankin (Stroke Patients Only)       Balance Overall balance assessment: Needs assistance Sitting-balance support: Bilateral upper extremity supported, Feet supported Sitting balance-Leahy Scale: Fair Sitting balance - Comments: Sat EOB for a prolonged period of time before attempting OOB mobility. He maintained forearms resting on legs and anterior lean. Cues for upright posture and PLB technique.   Standing balance support: Bilateral upper extremity supported, During functional activity, Reliant on assistive device for balance Standing balance-Leahy Scale: Poor Standing balance comment: Pt dependent on RW.                            Communication Communication Communication: Impaired Factors Affecting Communication: Hearing impaired  Cognition Arousal: Alert Behavior During Therapy: [facility]  for tasks assessed/performed   PT - Cognitive impairments: No apparent impairments                         Following commands: Intact      Cueing Cueing Techniques: Verbal cues   Exercises      General Comments General comments (skin integrity, edema, etc.): Pt greeted with [place] off. SpO2 96% at rest. With activity 91-92% on RA. Cued PLB technique.      Pertinent Vitals/Pain Pain Assessment Pain Assessment: Faces Faces Pain Scale: Hurts a little bit Pain Location: Generalized Pain Descriptors / Indicators: Discomfort, Sore Pain Intervention(s): Monitored during session, Limited activity within patient's tolerance, Repositioned    Home Living                          Prior Function            PT Goals (current goals can now be found in the care plan section) Acute Rehab PT Goals Patient Stated Goal: Get better PT Goal Formulation: With patient/family Time For Goal Achievement: 03/13/24 Potential to Achieve Goals: Good Progress towards PT goals: Progressing toward goals    Frequency    Min 2X/week      PT Plan      Co-evaluation              AM-PAC PT 6 Clicks Mobility   Outcome Measure  Help needed turning from your back to your side while in a flat bed without using bedrails?: A Little Help needed moving from lying on your back to sitting on the side of a flat bed without using bedrails?: A Little Help needed moving to and from a bed to a chair (including a wheelchair)?: A Little Help needed standing up from a chair using your arms (e.g., wheelchair or bedside chair)?: A Little Help needed to walk in hospital room?: A Lot Help needed climbing 3-5 steps with a railing? : A Lot 6 Click Score: 16    End of Session Equipment Utilized During Treatment: Gait belt Activity Tolerance: Patient limited by fatigue Patient left: in chair;with call bell/phone within reach;with chair alarm set Nurse Communication: Mobility status PT Visit Diagnosis: Other abnormalities of gait and mobility (R26.89);Muscle weakness (generalized) (M62.81)     Time: 9066-9045 PT Time Calculation (min) (ACUTE ONLY): 21 min  Charges:     $Therapeutic Activity: 8-22 mins PT General Charges $$ ACUTE PT VISIT: 1 Visit                     Randall SAUNDERS, PT, DPT Acute Rehabilitation Services Office: [phone] Secure Chat Preferred  Delon CHRISTELLA Callander 02/29/2024, 10:05 AM

## 2024-02-29 NOTE — Progress Notes (Signed)
 Call placed to Dr Raynaldo at [facility], pt oncologist to call Dr Cloretta to discuss patient care.

## 2024-02-29 NOTE — Progress Notes (Signed)
 " PROGRESS NOTE    Jordan Oconnell  FMW:[id number] DOB: April 18, 1940 DOA: 02/27/2024 PCP: Shepard Ade, MD    Brief Narrative:   84 year old with history of HTN, HLD, hypothyroidism, GERD, solitary kidney, atrial fibrillation, aortic stenosis status post TAVR, diastolic CHF, depression, metastatic bladder cancer status post cystoscopy/urostomy, left total nephrectomy and right partial nephrectomy presented due to fatigue.  Patient found to be hypotensive and acute kidney injury.  CT showed metastatic lung cancer.  Patient follows at [facility] with urology and oncology.  Recently admitted from 1/15 - 02/23/2024 for severe anemia, fatigue, gross hematuria requiring PRBC transfusion.  Hospital course complicated by UTI requiring Rocephin  for 5 days.  Assessment & Plan:  Generalized weakness Dehydration - IV fluids.  TSH recent TSH elevated, will repeat along with free T4.  Check vitamin D .  PT/OT  Pleural metastases with right grade malignant pleural effusion - History of metastatic uroepithelial cancer.  Currently patient is saturating 100% on 2 L nasal cannula.  Seen by oncology, appreciate their input.  Currently awaiting further pathology - Status post thoracentesis by IR 2/3, 950 cc removed.  Paroxysmal atrial fibrillation Aortic stenosis status post history of TAVR - Had 1 episode of RVR now improved.  Continue Coreg  and amiodarone .  Recent admission Eliquis  discontinued due for foreseeable future  CKD stage IIIb now worsening - Creatinine 2.5 now slowly trending upwards close to 3.0.  Also has some degree of uremia.  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  Treated with TXA from 1/21 - 1/23.  Held anticoagulation and advised outpatient follow-up.  Hyperlipidemia - Lipitor  Essential hypertension - Coreg , amlodipine , Imdur   Congestive heart failure with mildly reduced EF Coronary artery disease - EF 55% seen  on the echo in January 2026, grade 1 DD. - Coreg   Hypothyroidism - Synthroid .  Recently elevated TSH, recheck  Anemia of chronic disease -Baseline hemoglobin around 8.0  Vitamin D  deficiency - Supplements ordered   Had extensive goals of care conversation with the patient, daughter and son over the phone.  They agree that patient should be DNR/DNI.  They also agree that he should not receive feeding tube.  DVT prophylaxis: SCDs Start: 02/28/24 0154    Code Status: Limited: Do not attempt resuscitation (DNR) -DNR-LIMITED -Do Not Intubate/DNI  Family Communication: Family updated    PT Follow up Recs: Skilled Nursing-Short Term Rehab (<3 Hours/Day)02/29/2024 0900  Subjective: Patient was able to eat some over last 24 hours.  Feels more or less the same after receiving thoracentesis.  Denying any shortness of breath at rest at this time but overall still feels quite tired   Examination:  General exam: Appears calm and comfortable  Respiratory system: Diminished breath sounds on the right Cardiovascular system: S1 & S2 heard, RRR. No JVD, murmurs, rubs, gallops or clicks. No pedal edema. Gastrointestinal system: Abdomen is nondistended, soft and nontender. No organomegaly or masses felt. Normal bowel sounds heard. Central nervous system: Alert and oriented. No focal neurological deficits. Extremities: Symmetric 5 x 5 power. Skin: No rashes, lesions or ulcers Psychiatry: Judgement and insight appear normal. Mood & affect appropriate.                Diet Orders (From admission, onward)     Start     Ordered   02/28/24 1119  Diet regular Room service appropriate? Yes with Assist; Fluid consistency: Thin  Diet effective now  Question Answer Comment  Room service appropriate? Yes with Assist   Fluid consistency: Thin      02/28/24 1119            Objective: Vitals:   02/28/24 1949 02/29/24 0412 02/29/24 0811 02/29/24 1000  BP: 129/60 (!) 132/59 133/69    Pulse: 71 69 70   Resp: 20 20 18    Temp: 97.9 F (36.6 C) 97.7 F (36.5 C) 98.2 F (36.8 C)   TempSrc:      SpO2: 95% 91% 96% 93%  Weight:      Height:        Intake/Output Summary (Last 24 hours) at 02/29/2024 1328 Last data filed at 02/29/2024 9376 Gross per 24 hour  Intake 190.83 ml  Output 2500 ml  Net -2309.17 ml   Filed Weights   02/27/24 1434 02/28/24 1105  Weight: 80.7 kg 80.3 kg    Scheduled Meds:  amiodarone   200 mg Oral Daily   atorvastatin   10 mg Oral QHS   carvedilol   12.5 mg Oral BID   feeding supplement  237 mL Oral BID BM   levothyroxine   200 mcg Oral QAC breakfast   mirtazapine   7.5 mg Oral QHS   multivitamin with minerals  1 tablet Oral Daily   sodium bicarbonate   1,300 mg Oral BID   thiamine   100 mg Oral Daily   [START ON 03/06/2024] Vitamin D  (Ergocalciferol )  50,000 Units Oral Q7 days   Continuous Infusions:  Nutritional status Signs/Symptoms: severe muscle depletion, severe fat depletion, energy intake < 75% for > 7 days Interventions: Refer to RD note for recommendations Body mass index is 23.36 kg/m.  Data Reviewed:   CBC: Recent Labs  Lab 02/23/24 0359 02/27/24 1449 02/28/24 0250 02/29/24 0439  WBC 3.7* 6.9 4.3 4.3  HGB 8.9* 9.9* 8.0* 8.9*  HCT 28.8* 33.9* 25.9* 28.2*  MCV 93.8 97.7 92.8 91.0  PLT 126* 124* 114* 118*   Basic Metabolic Panel: Recent Labs  Lab 02/23/24 0359 02/27/24 1733 02/28/24 0250 02/29/24 0439  NA 141 141 140 141  K 4.5 4.6 4.2 4.5  CL 112* 108 108 111  CO2 20* 21* 22 22  GLUCOSE 97 172* 95 98  BUN 46* 57* 60* 49*  CREATININE 2.56* 3.27* 3.02* 2.51*  CALCIUM  9.1 8.9 8.4* 8.5*  MG  --   --  1.9 1.9  PHOS  --   --  3.2  --    GFR: Estimated Creatinine Clearance: 24.8 mL/min (A) (by C-G formula based on SCr of 2.51 mg/dL (H)). Liver Function Tests: Recent Labs  Lab 02/27/24 1733 02/28/24 0250  AST 41 40  ALT 47* 44  ALKPHOS 72 64  BILITOT 0.4 0.4  PROT 7.0 5.4*  ALBUMIN  3.2* 2.9*   No  results for input(s): LIPASE, AMYLASE in the last 168 hours. No results for input(s): AMMONIA in the last 168 hours. Coagulation Profile: No results for input(s): INR, PROTIME in the last 168 hours. Cardiac Enzymes: No results for input(s): CKTOTAL, CKMB, CKMBINDEX, TROPONINI in the last 168 hours. BNP (last 3 results) Recent Labs    02/27/24 1838  PROBNP 950.0*   HbA1C: No results for input(s): HGBA1C in the last 72 hours. CBG: No results for input(s): GLUCAP in the last 168 hours. Lipid Profile: No results for input(s): CHOL, HDL, LDLCALC, TRIG, CHOLHDL, LDLDIRECT in the last 72 hours. Thyroid  Function Tests: Recent Labs    02/28/24 0953  TSH 0.677  FREET4 2.31*   Anemia Panel:  No results for input(s): VITAMINB12, FOLATE, FERRITIN, TIBC, IRON, RETICCTPCT in the last 72 hours. Sepsis Labs: No results for input(s): PROCALCITON, LATICACIDVEN in the last 168 hours.  Recent Results (from the past 240 hours)  Urine Culture (for pregnant, neutropenic or urologic patients or patients with an indwelling urinary catheter)     Status: Abnormal   Collection Time: 02/27/24  3:33 PM   Specimen: Urine, Clean Catch  Result Value Ref Range Status   Specimen Description URINE, CLEAN CATCH  Final   Special Requests   Final    Normal Performed at [facility], [address] [place], [place] [address]    Culture MULTIPLE SPECIES PRESENT, SUGGEST RECOLLECTION (A)  Final   Report Status 02/28/2024 FINAL  Final  Body fluid culture w Gram Stain     Status: None (Preliminary result)   Collection Time: 02/28/24  1:47 PM   Specimen: Lung, Right; Pleural Fluid  Result Value Ref Range Status   Specimen Description FLUID PLEURAL RIGHT  Final   Special Requests NONE  Final   Gram Stain NO WBC SEEN NO ORGANISMS SEEN   Final   Culture   Final    NO GROWTH < 24 HOURS Performed at [facility], [address], [place], [place]  [address]    Report Status PENDING  Incomplete         Radiology Studies: IR THORACENTESIS RIGHT ASP PLEURAL SPACE W/IMG GUIDE Result Date: 02/28/2024 INDICATION: Patient with a history of metastatic bladder cancer presents today with new onset right pleural effusion. Interventional Radiology asked to perform a diagnostic and therapeutic thoracentesis. EXAM: ULTRASOUND GUIDED THORACENTESIS MEDICATIONS: 1% lidocaine  10 mL COMPLICATIONS: None immediate. PROCEDURE: An ultrasound guided thoracentesis was thoroughly discussed with the patient and questions answered. The benefits, risks, alternatives and complications were also discussed. The patient understands and wishes to proceed with the procedure. Written consent was obtained. Ultrasound was performed to localize and mark an adequate pocket of fluid in the right chest. The area was then prepped and draped in the normal sterile fashion. 1% Lidocaine  was used for local anesthesia. Under ultrasound guidance a 6 Fr Safe-T-Centesis catheter was introduced. Thoracentesis was performed. The catheter was removed and a dressing applied. FINDINGS: A total of approximately 1.05 L of bloody fluid was removed. Samples were sent to the laboratory as requested by the clinical team. IMPRESSION: Successful ultrasound guided right thoracentesis yielding 1.05 L of pleural fluid. Procedure performed by Leanor Forget, PA-C Electronically Signed   By: Ester Sides M.D.   On: 02/28/2024 16:42   DG Chest Port 1 View Result Date: 02/28/2024 EXAM: 1 VIEW(S) XRAY OF THE CHEST 02/28/2024 01:23:28 PM COMPARISON: 02/27/2024 CLINICAL HISTORY: Status post thoracentesis. FINDINGS: LINES, TUBES AND DEVICES: Post Transcatheter Aortic Valve Replacement (TAVR). Left chest cardiac pacing device. LUNGS AND PLEURA: Decreased right pleural effusion, now small with associated atelectasis. Persistent left retrocardiac airspace opacity. Small left pleural effusion. No pneumothorax. HEART AND  MEDIASTINUM: Cardiomegaly. Atherosclerotic calcifications of aorta. BONES AND SOFT TISSUES: No acute osseous abnormality. IMPRESSION: 1. Decreased right pleural effusion, small, with associated atelectasis. 2. Small left pleural effusion. 3. Persistent left retrocardiac airspace opacity. Electronically signed by: Norleen Boxer MD 02/28/2024 01:51 PM EST RP Workstation: [id number]   CT Chest Wo Contrast Result Date: 02/27/2024 CLINICAL DATA:  Respiratory illness, nondiagnostic chest x-ray, history of uroepithelial cancer with evidence of metastatic disease on recent abdominal CT EXAM: CT CHEST WITHOUT CONTRAST TECHNIQUE: Multidetector CT imaging of the chest  was performed following the standard protocol without IV contrast. RADIATION DOSE REDUCTION: This exam was performed according to the departmental dose-optimization program which includes automated exposure control, adjustment of the mA and/or kV according to patient size and/or use of iterative reconstruction technique. COMPARISON:  02/27/2024, 03/12/2022 FINDINGS: Cardiovascular: Unenhanced imaging of the heart demonstrates borderline cardiomegaly. Aortic valve prosthesis is identified. There is a dual lead cardiac pacer, proximal lead in the right atrium and distal lead in the right ventricle. 4.2 cm ascending thoracic aortic aneurysm. Assessment of the vascular lumen cannot be performed without intravenous contrast. Atherosclerosis of the aorta and coronary vasculature. Mediastinum/Nodes: No enlarged mediastinal or axillary lymph nodes. Thyroid  gland, trachea, and esophagus demonstrate no significant findings. Lungs/Pleura: Moderate right pleural effusion, volume estimated less than 2 L. there is a 3.2 x 1.3 x 1.9 cm masslike hyperdense area along the posterior pleural surface within the right mid chest, reference image 73/2. A second hyperdense nodular area within the lateral right upper pleural surface reference image 53/2 measures approximate 1.0 x 2.2 x  1.2 cm. Findings are consistent with pleural metastases and malignant effusion. There is complete right lower lobe atelectasis, with significant compressive atelectasis in the posterior aspect of the right upper lobe. Right middle lobe remains patent. Minimal scarring or subsegmental atelectasis within the left lower lobe. No left effusion. No pneumothorax. Central airways are patent. Upper Abdomen: Partial visualization of the right-sided hydronephrosis noted on prior exam due to obstructing mass within the right ureter. The retroperitoneal adenopathy noted on prior CT is also partially visualized. Hypodense lesions throughout the liver are again noted consistent with hepatic metastases. Musculoskeletal: There are no acute displaced fractures. Reconstructed images demonstrate no additional findings. IMPRESSION: 1. Right-sided pleural based soft tissue nodules and moderate right pleural effusion, consistent with pleural metastases and right-sided malignant pleural effusion. 2. Stable findings within the upper abdomen compatible with the patient's known metastatic uroepithelial cancer. Please see CT abdomen and pelvis 02/09/2024 for full description of abdominal and pelvic findings. 3. 4.2 cm ascending thoracic aortic aneurysm. Assessment of the vascular lumen cannot be performed without intravenous contrast. Recommend annual imaging followup by CTA or MRA. This recommendation follows 2010 ACCF/AHA/AATS/ACR/ASA/SCA/SCAI/SIR/STS/SVM Guidelines for the Diagnosis and Management of Patients with Thoracic Aortic Disease. Circulation. 2010; 121: Z733-z630. Aortic aneurysm NOS (ICD10-I71.9) 4. Aortic Atherosclerosis (ICD10-I70.0). Coronary artery atherosclerosis. Electronically Signed   By: Ozell Daring M.D.   On: 02/27/2024 19:28   DG Chest 2 View Result Date: 02/27/2024 CLINICAL DATA:  Shortness of breath. EXAM: CHEST - 2 VIEW COMPARISON:  03/06/2023 FINDINGS: Left-sided pacemaker unchanged. Opacification over the  right lung base compatible with moderate effusion with associated atelectasis. Patchy opacification which is mostly linear over the right midlung likely atelectasis. Infection in the right base is possible. Left lung is clear. Stable cardiomegaly. Prior TAVR. Remainder of the exam is unchanged. IMPRESSION: 1. Findings suggesting moderate size right pleural effusion likely with associated basilar atelectasis. Patchy right mid lung opacification likely atelectasis. Infection over the right base is possible. 2.  Stable cardiomegaly. Electronically Signed   By: Toribio Agreste M.D.   On: 02/27/2024 16:33           LOS: 1 day   Time spent= 35 mins    Burgess JAYSON Dare, MD [facility]  If 7PM-7AM, please contact night-coverage  02/29/2024, 1:28 PM  "

## 2024-02-29 NOTE — [place] FL2 (Signed)
 " [place]  MEDICAID FL2 LEVEL OF CARE FORM     IDENTIFICATION  Patient Name: Jordan Oconnell Birthdate: 05-12-1940 Sex: male Admission Date (Current Location): 02/27/2024  [facility] and [place] Number:  Producer, Television/film/video and Address:  [facility] [place], [address], [place], [place] [address]      Provider Number: [id number]  Attending Physician Name and Address:  Caleen Burgess BROCKS, MD  Relative Name and Phone Number:  [facility]  Daughter, Emergency Contact  [phone]    Current Level of Care: Hospital Recommended Level of Care: Skilled Nursing Facility Prior Approval Number:    Date Approved/Denied:   PASRR Number: [id number]  Discharge Plan: SNF    Current Diagnoses: Patient Active Problem List   Diagnosis Date Noted   AKI (acute kidney injury) 02/27/2024   Elevated troponin 02/27/2024   Pleural effusion 02/27/2024   Bacteria in urine 02/27/2024   Peripheral artery disease 02/09/2024   Acute blood loss anemia 02/09/2024   Heart block AV complete (HCC) 02/09/2024   Coronary artery disease involving native coronary artery of native heart without angina pectoris 02/09/2024   Hypothyroidism due to amiodarone  02/09/2024   Osteomyelitis of fourth toe of left foot (HCC) 05/03/2023   Osteomyelitis of left foot (HCC) 04/26/2023   Toe pain, left 04/15/2023   Non-ST elevation (NSTEMI) myocardial infarction (HCC) 03/08/2023   Osteomyelitis of fourth toe of right foot (HCC) 03/01/2023   Chronic combined systolic and diastolic CHF (congestive heart failure) (HCC) 03/01/2023   Hypothyroidism 03/01/2023   Tachycardia-bradycardia syndrome (HCC) 03/11/2022   Atrial fibrillation with rapid ventricular response (HCC) 03/11/2022   Atypical atrial flutter (HCC) 03/03/2022   Pacemaker 02/12/2021   LBBB (left bundle branch block) 01/29/2021   1st degree AV block 01/29/2021   S/P TAVR (transcatheter aortic valve replacement) 01/27/2021   Severe aortic stenosis  10/05/2020   CKD (chronic kidney disease) stage 4, GFR 15-29 ml/min (HCC) 02/01/2017   Presence of urostomy (HCC) 09/22/2016   Pneumonia 07/26/2013   Intractable hiccups 07/22/2013   Anemia 07/22/2013   Protein-calorie malnutrition, severe (HCC) 07/19/2013   Syncope and collapse 07/17/2013   Bladder cancer (HCC) 07/17/2013   Dehydration 07/17/2013   Major depression, single episode 06/06/2013   Urothelial cancer (HCC) 09/03/2012   Essential hypertension 09/03/2012   PAF (paroxysmal atrial fibrillation) (HCC) 09/03/2012   Hyperlipidemia 01/29/2009   Gastro-esophageal reflux disease without esophagitis 01/16/2009    Orientation RESPIRATION BLADDER Height & Weight     Self, Time, Situation, Place  Normal Continent Weight: 177 lb 0.5 oz (80.3 kg) (Bed weight) Height:  6' 1 (185.4 cm)  BEHAVIORAL SYMPTOMS/MOOD NEUROLOGICAL BOWEL NUTRITION STATUS      Incontinent Diet  AMBULATORY STATUS COMMUNICATION OF NEEDS Skin   Limited Assist Verbally                         Personal Care Assistance Level of Assistance  Bathing, Feeding, Dressing Bathing Assistance: Maximum assistance Feeding assistance: Independent Dressing Assistance: Maximum assistance     Functional Limitations Info  Sight, Speech, Hearing Sight Info: Impaired Hearing Info: Impaired Speech Info: Adequate    SPECIAL CARE FACTORS FREQUENCY  PT (By licensed PT), OT (By licensed OT)     PT Frequency: 5x/wk OT Frequency: 5x/wk            Contractures Contractures Info: Not present    Additional Factors Info  Code Status, Allergies Code Status Info: DNR Allergies Info: Amoxicillin  Doxycycline  Flomax (Tamsulosin Hcl)           Current Medications (02/29/2024):  This is the current hospital active medication list Current Facility-Administered Medications  Medication Dose Route Frequency Provider Last Rate Last Admin   acetaminophen  (TYLENOL ) tablet 650 mg  650 mg Oral Q6H PRN Doutova, Anastassia, MD        Or   acetaminophen  (TYLENOL ) suppository 650 mg  650 mg Rectal Q6H PRN Doutova, Anastassia, MD       amiodarone  (PACERONE ) tablet 200 mg  200 mg Oral Daily Doutova, Anastassia, MD   200 mg at 02/29/24 1006   atorvastatin  (LIPITOR) tablet 10 mg  10 mg Oral QHS Doutova, Anastassia, MD   10 mg at 02/28/24 2307   carvedilol  (COREG ) tablet 12.5 mg  12.5 mg Oral BID Doutova, Anastassia, MD   12.5 mg at 02/29/24 1006   feeding supplement (ENSURE PLUS HIGH PROTEIN) liquid 237 mL  237 mL Oral BID BM Amin, Ankit C, MD   237 mL at 02/28/24 1412   guaiFENesin  (ROBITUSSIN) 100 MG/5ML liquid 5 mL  5 mL Oral Q4H PRN Amin, Ankit C, MD       hydrALAZINE  (APRESOLINE ) injection 10 mg  10 mg Intravenous Q4H PRN Amin, Ankit C, MD       HYDROcodone -acetaminophen  (NORCO/VICODIN) 5-325 MG per tablet 1-2 tablet  1-2 tablet Oral Q4H PRN Doutova, Anastassia, MD       ipratropium-albuterol  (DUONEB) 0.5-2.5 (3) MG/3ML nebulizer solution 3 mL  3 mL Nebulization Q4H PRN Amin, Ankit C, MD       levothyroxine  (SYNTHROID ) tablet 200 mcg  200 mcg Oral QAC breakfast Doutova, Anastassia, MD   200 mcg at 02/29/24 9375   metoprolol  tartrate (LOPRESSOR ) injection 5 mg  5 mg Intravenous Q4H PRN Amin, Ankit C, MD       mirtazapine  (REMERON ) tablet 7.5 mg  7.5 mg Oral QHS Doutova, Anastassia, MD   7.5 mg at 02/28/24 2300   multivitamin with minerals tablet 1 tablet  1 tablet Oral Daily Amin, Ankit C, MD   1 tablet at 02/29/24 1006   ondansetron  (ZOFRAN ) tablet 4 mg  4 mg Oral Q6H PRN Doutova, Anastassia, MD       Or   ondansetron  (ZOFRAN ) injection 4 mg  4 mg Intravenous Q6H PRN Doutova, Anastassia, MD       senna-docusate (Senokot-S) tablet 1 tablet  1 tablet Oral QHS PRN Amin, Ankit C, MD       sodium bicarbonate  tablet 1,300 mg  1,300 mg Oral BID Doutova, Anastassia, MD   1,300 mg at 02/29/24 1006   thiamine  (VITAMIN B1) tablet 100 mg  100 mg Oral Daily Amin, Ankit C, MD   100 mg at 02/29/24 1006   [START ON 03/06/2024] Vitamin D   (Ergocalciferol ) (DRISDOL ) 1.25 MG (50000 UNIT) capsule 50,000 Units  50,000 Units Oral Q7 days Caleen Burgess BROCKS, MD         Discharge Medications: Please see discharge summary for a list of discharge medications.  Relevant Imaging Results:  Relevant Lab Results:   Additional Information SS# [id number]  Pen Mar, LCSWA     "

## 2024-03-01 ENCOUNTER — Telehealth: Payer: Self-pay | Admitting: Cardiology

## 2024-03-01 ENCOUNTER — Encounter (HOSPITAL_COMMUNITY): Payer: Self-pay | Admitting: Internal Medicine

## 2024-03-01 ENCOUNTER — Inpatient Hospital Stay (HOSPITAL_COMMUNITY)

## 2024-03-01 MED ORDER — CHLORPROMAZINE HCL 25 MG PO TABS
25.0000 mg | ORAL_TABLET | Freq: Three times a day (TID) | ORAL | Status: AC | PRN
  Administered 2024-03-01 (×2): 25 mg via ORAL
  Filled 2024-03-01 (×3): qty 1

## 2024-03-01 NOTE — Progress Notes (Signed)
 "                                                                                                                                                        Daily Progress Note   Patient Name: Jordan Oconnell       Date: 03/01/2024 DOB: March 15, 1940  Age: 84 y.o. MRN#: [id number] Attending Physician: Caleen Burgess BROCKS, MD Primary Care Physician: Shepard Ade, MD Admit Date: 02/27/2024  Reason for Consultation/Follow-up: Establishing goals of care  Subjective: Patient sitting in bedside chair, requesting urgent assistance with ambulating to the restroom. Unfortunately had some incontinence before making it. His daughter is present visiting.  Created space and opportunity for patient and family's thoughts and feelings on patient's current illness. Patient declined conversation after trip to restroom. Spoke with daughter about patient's difficulty sleeping overnight in anticipation of visit with Dr. Cloretta. She has a good understanding of the plan for next steps in diagnostic testing and that he clearly has cancer, though still needing more information about the specific type of cancer to determine his treatment options. She also shares that she brought his advance directives, though has not been able to review with him and ensure it accurately depicts his wishes and care preferences today. She plans to do this today. I shared that she is welcome to call me back to obtain a copy for EMR today if the AD is accurate vs wait until I am back on service Monday.  Objective: Reviewed oncology progress note and hospitalist progress note from today, PT note. CXR reviewed from this morning, showing increased right pleural effusion. To be determined whether he can undergo repeat thoracentesis for this vs possible biopsy of another metastasis site.    Physical Exam Vitals and nursing note reviewed.  Constitutional:      General: He is not in acute distress.    Appearance: He is ill-appearing.  HENT:     Head:  Normocephalic and atraumatic.  Cardiovascular:     Rate and Rhythm: Normal rate.  Pulmonary:     Effort: Pulmonary effort is normal. No respiratory distress.  Neurological:     Mental Status: He is alert. Mental status is at baseline.     Gait: Gait is intact.     Comments: With walker  Psychiatric:        Cognition and Memory: Cognition normal.            Vital Signs: BP 130/62 (BP Location: Right Arm)   Pulse 69   Temp 98.5 F (36.9 C)   Resp 19   Ht 6' 1 (1.854 m)   Wt 80.3 kg Comment: Bed weight  SpO2 95%   BMI 23.36 kg/m  SpO2: SpO2: 95 % O2 Device: O2 Device: Room Air O2 Flow Rate: O2 Flow Rate (L/min): 2 L/min  Palliative Assessment/Data: 50%   Palliative Care Assessment & Plan   Patient Profile: 84 y.o. male  with past medical history of HTN, HLD, GERD, hypothyroidism, solitary kidney, A-fib, aortic stenosis, diastolic CHF, depression and anemia, bladder cancer metastatic, status post cystoscopy/urostomy via of left total nephrectomy and right partial nephrectomy,  admitted on 02/27/2024 with fatigue.    Patient is readmitted after discharge less than 7 days ago for hospitalization due to acute blood loss anemia in the context of hematuria and bladder cancer.  He was found to be hypotensive and with AKI, given fluids.  He is interested in transferring his care from [place] to [place].   PMT has been consulted to assist with goals of care conversation and complex medical decision making.  He is status post thoracentesis of malignant pleural effusion and awaiting results from pleural fluid cytology to determine cancer treatment options.  Assessment: Goals of care conversation Failure to thrive Metastatic bladder cancer  Recommendations/Plan: Continue DNR/DNI Goal is for improvement and rehabilitation. Patient continues to await further guidance from oncology after repeat thoracentesis vs biopsy of another site Ongoing goals of care discussions, will see again  Monday 2/9 PMT will continue to follow and support   Prognosis: Worrisome prognosis with functional decline in the context of metastatic bladder cancer  Discharge Planning: To Be Determined  Care plan was discussed with patient, patient's daughter   Jordan SHAUNNA Fell, PA-C  Palliative Medicine Team Team phone # [phone]  Thank you for allowing the Palliative Medicine Team to assist in the care of this patient. Please utilize secure chat with additional questions, if there is no response within 30 minutes please call the above phone number.  Palliative Medicine Team providers are available by phone from 7am to 7pm daily and can be reached through the team cell phone.  Should this patient require assistance outside of these hours, please call the patient's attending physician.   I personally spent a total of 35 minutes in the care of the patient today including preparing to see the patient, getting/reviewing separately obtained history, performing a medically appropriate exam/evaluation, counseling and educating, and documenting clinical information in the EHR.  "

## 2024-03-01 NOTE — Progress Notes (Signed)
 " PROGRESS NOTE    Jordan Oconnell  FMW:[id number] DOB: Jun 16, 1940 DOA: 02/27/2024 PCP: Shepard Ade, MD    Brief Narrative:   84 year old with history of HTN, HLD, hypothyroidism, GERD, solitary kidney, atrial fibrillation, aortic stenosis status post TAVR, diastolic CHF, depression, metastatic bladder cancer status post cystoscopy/urostomy, left total nephrectomy and right partial nephrectomy presented due to fatigue.  Patient found to be hypotensive and acute kidney injury.  CT showed metastatic lung cancer.  Patient follows at [facility] with urology and oncology.  Recently admitted from 1/15 - 02/23/2024 for severe anemia, fatigue, gross hematuria requiring PRBC transfusion.  Hospital course complicated by UTI requiring Rocephin  for 5 days.  Upon admission noted to have right-sided effusion with pleural metastases and liver lesions.  Underwent thoracentesis on 2/3 but essentially was nondiagnostic therefore we will plan on repeat thoracentesis versus tissue biopsy.  Based on that ongoing discussions with Dr Deanne regarding further treatment for his cancer versus hospice.   Assessment & Plan:  Generalized weakness Dehydration Vitamin D  deficiency - IV fluids.  TSH overall stable.  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  Currently patient is saturating 100% on 2 L nasal cannula.  - Status post thoracentesis by IR 2/3, 950 cc removed.  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  Treated with TXA from 1/21 - 1/23.  Held anticoagulation and advised outpatient follow-up.  Paroxysmal atrial fibrillation Aortic stenosis status post history of TAVR - Had 1 episode of RVR now  improved.  Continue Coreg  and amiodarone .  Recent admission Eliquis  discontinued due for foreseeable future  CKD stage IIIb now worsening - Creatinine 2.5 now slowly trending upwards close to 3.0.  Also has some degree of uremia.  Hyperlipidemia - Lipitor  Essential hypertension - Coreg , amlodipine , Imdur . IV prns  Congestive heart failure with mildly reduced EF Coronary artery disease - EF 55% seen on the echo in January 2026, grade 1 DD. - Coreg   Hypothyroidism - Synthroid .  Recently elevated TSH, recheck  Anemia of chronic disease -Baseline hemoglobin around 8.0  Vitamin D  deficiency - Supplements ordered   Had extensive goals of care conversation with the patient, daughter and son over the phone.  They agree that patient should be DNR/DNI.  They also agree that he should not receive feeding tube.  DVT prophylaxis: SCDs Start: 02/28/24 0154    Code Status: Limited: Do not attempt resuscitation (DNR) -DNR-LIMITED -Do Not Intubate/DNI  Family Communication: Family updated    PT Follow up Recs: Skilled Nursing-Short Term Rehab (<3 Hours/Day)02/29/2024 0900  Subjective: Patient was able to eat some over last 24 hours.  Feels more or less the same after receiving thoracentesis.  Denying any shortness of breath at rest at this time but overall still feels quite tired Overall feeling ok.   Examination:  General exam: Appears calm and comfortable  Respiratory system: Diminished breath sounds on the right Cardiovascular system: S1 & S2 heard, RRR. No JVD, murmurs, rubs, gallops or clicks. No pedal edema. Gastrointestinal system: Abdomen is nondistended, soft and nontender. No organomegaly or masses felt. Normal bowel sounds heard. Central nervous system: Alert and oriented. No focal neurological deficits. Extremities: Symmetric 5 x 5 power. Skin: No rashes, lesions or ulcers Psychiatry: Judgement and insight  appear normal. Mood & affect appropriate.                 Diet Orders (From admission, onward)     Start     Ordered   02/28/24 1119  Diet regular Room service appropriate? Yes with Assist; Fluid consistency: Thin  Diet effective now       Question Answer Comment  Room service appropriate? Yes with Assist   Fluid consistency: Thin      02/28/24 1119            Objective: Vitals:   02/29/24 1644 02/29/24 2117 03/01/24 0537 03/01/24 0755  BP: (!) 128/57 (!) 125/59 125/66 130/62  Pulse: 69 65 71 69  Resp: 20 18 17 19   Temp: 98.1 F (36.7 C) 98.7 F (37.1 C) 97.9 F (36.6 C) 98.5 F (36.9 C)  TempSrc: Oral  Oral   SpO2: 94% 93% 93% 95%  Weight:      Height:        Intake/Output Summary (Last 24 hours) at 03/01/2024 1318 Last data filed at 03/01/2024 1238 Gross per 24 hour  Intake 360 ml  Output 1850 ml  Net -1490 ml   Filed Weights   02/27/24 1434 02/28/24 1105  Weight: 80.7 kg 80.3 kg    Scheduled Meds:  amiodarone   200 mg Oral Daily   atorvastatin   10 mg Oral QHS   carvedilol   12.5 mg Oral BID   feeding supplement  237 mL Oral BID BM   levothyroxine   200 mcg Oral QAC breakfast   mirtazapine   7.5 mg Oral QHS   multivitamin with minerals  1 tablet Oral Daily   sodium bicarbonate   1,300 mg Oral BID   thiamine   100 mg Oral Daily   [START ON 03/06/2024] Vitamin D  (Ergocalciferol )  50,000 Units Oral Q7 days   Continuous Infusions:  Nutritional status Signs/Symptoms: severe muscle depletion, severe fat depletion, energy intake < 75% for > 7 days Interventions: Refer to RD note for recommendations Body mass index is 23.36 kg/m.  Data Reviewed:   CBC: Recent Labs  Lab 02/27/24 1449 02/28/24 0250 02/29/24 0439  WBC 6.9 4.3 4.3  HGB 9.9* 8.0* 8.9*  HCT 33.9* 25.9* 28.2*  MCV 97.7 92.8 91.0  PLT 124* 114* 118*   Basic Metabolic Panel: Recent Labs  Lab 02/27/24 1733 02/28/24 0250 02/29/24 0439  NA 141 140 141  K 4.6 4.2 4.5  CL 108 108 111  CO2 21* 22 22  GLUCOSE 172* 95 98  BUN 57* 60* 49*   CREATININE 3.27* 3.02* 2.51*  CALCIUM  8.9 8.4* 8.5*  MG  --  1.9 1.9  PHOS  --  3.2  --    GFR: Estimated Creatinine Clearance: 24.8 mL/min (A) (by C-G formula based on SCr of 2.51 mg/dL (H)). Liver Function Tests: Recent Labs  Lab 02/27/24 1733 02/28/24 0250  AST 41 40  ALT 47* 44  ALKPHOS 72 64  BILITOT 0.4 0.4  PROT 7.0 5.4*  ALBUMIN  3.2* 2.9*   No results for input(s): LIPASE, AMYLASE in the last 168 hours. No results for input(s): AMMONIA in the last 168 hours. Coagulation Profile: No results for input(s): INR, PROTIME in the last 168 hours. Cardiac Enzymes: No results for input(s): CKTOTAL, CKMB, CKMBINDEX, TROPONINI in the last 168 hours. BNP (last 3 results) Recent Labs    02/27/24 1838  PROBNP 950.0*   HbA1C: No results for input(s): HGBA1C in the last 72 hours. CBG: No results for input(s):  GLUCAP in the last 168 hours. Lipid Profile: No results for input(s): CHOL, HDL, LDLCALC, TRIG, CHOLHDL, LDLDIRECT in the last 72 hours. Thyroid  Function Tests: Recent Labs    02/28/24 0953  TSH 0.677  FREET4 2.31*   Anemia Panel: No results for input(s): VITAMINB12, FOLATE, FERRITIN, TIBC, IRON, RETICCTPCT in the last 72 hours. Sepsis Labs: No results for input(s): PROCALCITON, LATICACIDVEN in the last 168 hours.  Recent Results (from the past 240 hours)  Urine Culture (for pregnant, neutropenic or urologic patients or patients with an indwelling urinary catheter)     Status: Abnormal   Collection Time: 02/27/24  3:33 PM   Specimen: Urine, Clean Catch  Result Value Ref Range Status   Specimen Description URINE, CLEAN CATCH  Final   Special Requests   Final    Normal Performed at [facility], [address] [place], [place] [address]    Culture MULTIPLE SPECIES PRESENT, SUGGEST RECOLLECTION (A)  Final   Report Status 02/28/2024 FINAL  Final  Body fluid culture w Gram Stain     Status: None  (Preliminary result)   Collection Time: 02/28/24  1:47 PM   Specimen: Lung, Right; Pleural Fluid  Result Value Ref Range Status   Specimen Description FLUID PLEURAL RIGHT  Final   Special Requests NONE  Final   Gram Stain NO WBC SEEN NO ORGANISMS SEEN   Final   Culture   Final    NO GROWTH 2 DAYS Performed at [facility], [address], [place], [place] [address]    Report Status PENDING  Incomplete         Radiology Studies: DG Chest 2 View Result Date: 03/01/2024 CLINICAL DATA:  Pleural effusion EXAM: CHEST - 2 VIEW COMPARISON:  Two days ago FINDINGS: Stable cardiomegaly. Status post transcatheter aortic valve repair. Left-sided pacemaker is unchanged. Left lung is clear. Increased right pleural effusion is noted with associated atelectasis. Old distal right clavicular fracture is noted. IMPRESSION: Increased right pleural effusion is noted with associated atelectasis. Electronically Signed   By: Lynwood Landy Raddle M.D.   On: 03/01/2024 09:55   DG Chest Port 1 View Result Date: 02/28/2024 EXAM: 1 VIEW(S) XRAY OF THE CHEST 02/28/2024 01:23:28 PM COMPARISON: 02/27/2024 CLINICAL HISTORY: Status post thoracentesis. FINDINGS: LINES, TUBES AND DEVICES: Post Transcatheter Aortic Valve Replacement (TAVR). Left chest cardiac pacing device. LUNGS AND PLEURA: Decreased right pleural effusion, now small with associated atelectasis. Persistent left retrocardiac airspace opacity. Small left pleural effusion. No pneumothorax. HEART AND MEDIASTINUM: Cardiomegaly. Atherosclerotic calcifications of aorta. BONES AND SOFT TISSUES: No acute osseous abnormality. IMPRESSION: 1. Decreased right pleural effusion, small, with associated atelectasis. 2. Small left pleural effusion. 3. Persistent left retrocardiac airspace opacity. Electronically signed by: Norleen Boxer MD 02/28/2024 01:51 PM EST RP Workstation: [id number]           LOS: 2 days   Time spent= 35 mins    Burgess JAYSON Dare, MD [facility]  If 7PM-7AM, please contact night-coverage  03/01/2024, 1:18 PM  "

## 2024-03-01 NOTE — Plan of Care (Signed)

## 2024-03-01 NOTE — Consult Note (Signed)
 "     Chief Complaint: Patient was seen in consultation today for concern for metastatic CA, in need of tissue sampling  Chief Complaint  Patient presents with   Weakness   GI Problem   at the request of Amin, Ankit/ Cloretta Kuba   Referring Physician(s): Caleen, Ankit/ Cloretta Kuba   Supervising Physician: Hughes Simmonds  Patient Status: [facility] - In-pt  History of Present Illness: Jordan Oconnell is a 84 y.o. male with PMHs of HTN, HLD, hypothyroidism, GERD, solitary kidney, atrial fibrillation, aortic stenosis status post TAVR, diastolic CHF, depression, metastatic bladder cancer status post cystoscopy/urostomy, left total nephrectomy and right partial nephrectomy, new finding of lung nodule, RP lymphadenopathy, liver lesion and right pleural effusion, IR was consulted for possible tissue sampling.   Patient has hx of metastatic bladder cancer and he is status post cystoscopy/urostomy, left total nephrectomy and right partial nephrectomy, followed by [facility] urology. He was recently admitted from 1/15 to 01/2924 due to fatigue, AKI and hematuria. CT CT A/P on 02/09/24 showed new retroperitoneal and right inguinal lymphadenopathy and hepatic lesions consistent with metastatic disease.   Patient was brought to ED on 02/27/24 due to weakness, imaging showed right-sided pleural based soft tissue nodules and moderate right pleural effusion, consistent with pleural metastases and right-sided malignant pleural effusion. Patient was admitted and underwent therapeutic and diagnostic thoracentesis on 02/28/24 which yielded 1.05 L, cytology did not show any malignant cells. Palliative and oncology are following the patient, patient's code status was changed to DNR on 2/4 but he wishes to purse curative treatments. IR was requested to evaluate the patient for possible tissue sampling and/or repeat diagnotic thoracentesis. Case was reviewed and approved for  liver lesion biopsy by Dr. Hughes.   Patient seen in  room.  Patient sitting in a recliner sleeping, not in acute distress. Daughter at bedside, son joins the conversation via phone.  Reports chronic hematuria for couple months.  Denise headache, fever, chills, shortness of breath, cough, chest pain, abdominal pain, nausea ,vomiting.   Risks and benefits of liver lesion biopsy was discussed in length. Patient and family are concerns about contrast use during angiogram with possible embolization which may needed if patient develops significant internal bleeding after biopsy as patient has one kidney and they have been very careful with his renal function. Family asks to speak with Dr. Cloretta, spoke with Dr. Audery who agrees that contrast use for angiogram and possible embolization must be considered if patient develops life threatening internal bleeding after the biopsy. He recommended reaching out to patient's nephrologist for further discussion.  Family was informed the conversation with Dr. Cloretta, they states that they are ok for contrast use if it absolutely needed to save patient's life. No need to discusse with nephrologist.   Discussed patient's DNR status during the procedure in detail. Patient and family will discuss overnight and will let IR know tomorrow for their decision.   Past Medical History:  Diagnosis Date   Acute on chronic systolic CHF (congestive heart failure) (HCC) 03/13/2022   Acute renal failure superimposed on stage 4 chronic kidney disease (HCC) 03/13/2022   Atrial fibrillation with rapid ventricular response (HCC) 03/11/2022   Bladder cancer (HCC)    BPH (benign prostatic hyperplasia)    CAD (coronary artery disease)    CKD (chronic kidney disease), stage IV (HCC)    Colon polyps    Diverticulosis    GERD (gastroesophageal reflux disease)    Hyperchloremic metabolic acidosis    Hyperlipidemia  pt denies   Hypertension    LBBB (left bundle branch block)    Osteomyelitis of left foot (HCC) 04/26/2023   PAF  (paroxysmal atrial fibrillation) (HCC)    Presence of permanent cardiac pacemaker    Renal cell carcinoma (HCC) 2008   left   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 Wonda and Dr. Lucas   Severe aortic stenosis 10/05/2020   Tachy-brady syndrome (HCC)    Toe pain, left 04/15/2023    Past Surgical History:  Procedure Laterality Date   AMPUTATION TOE Right 03/02/2023   Procedure: AMPUTATION TOE, 4th;  Surgeon: Barton Drape, MD;  Location: [facility] ORS;  Service: Orthopedics;  Laterality: Right;   AMPUTATION TOE Left 05/04/2023   Procedure: AMPUTATION, TOE;  Surgeon: Barton Drape, MD;  Location: [facility] ORS;  Service: Orthopedics;  Laterality: Left;  left foot 4th toe metatarsalphalangeal joint disarticulation   APPENDECTOMY     BLADDER SURGERY  01/25/2006   transurethral resection/resection of prostatic urethra   BOWEL RESECTION     CARDIOVERSION N/A 03/03/2022   Procedure: CARDIOVERSION;  Surgeon: Lonni Slain, MD;  Location: [facility] ENDOSCOPY;  Service: Cardiovascular;  Laterality: N/A;   CARDIOVERSION N/A 03/12/2022   Procedure: CARDIOVERSION;  Surgeon: Sheena Pugh, DO;  Location: [facility] ENDOSCOPY;  Service: Cardiovascular;  Laterality: N/A;   ESOPHAGOGASTRODUODENOSCOPY N/A 07/24/2013   Procedure: ESOPHAGOGASTRODUODENOSCOPY (EGD);  Surgeon: Norleen LOISE Kiang, MD;  Location: [facility];  Service: Endoscopy;  Laterality: N/A;   INTRAOPERATIVE TRANSTHORACIC ECHOCARDIOGRAM N/A 01/27/2021   Procedure: INTRAOPERATIVE TRANSTHORACIC ECHOCARDIOGRAM;  Surgeon: Wonda Sharper, MD;  Location: [facility] INVASIVE CV LAB;  Service: Open Heart Surgery;  Laterality: N/A;   IR FLUORO GUIDE CV LINE RIGHT  03/04/2023   IR REMOVAL TUN CV CATH W/O FL  05/09/2023   IR THORACENTESIS RIGHT ASP PLEURAL SPACE W/IMG GUIDE  02/28/2024   IR US  GUIDE VASC ACCESS RIGHT  03/04/2023   laparoscopic surgery  01/25/2010   laser   (?)    NEPHRECTOMY  01/25/2005   partial, left   NEPHROSTOMY  01/25/2009   stent   NM MYOCAR PERF WALL MOTION  09/08/2010   Normal   PACEMAKER IMPLANT N/A 02/12/2021   Procedure: PACEMAKER IMPLANT;  Surgeon: Cindie Ole DASEN, MD;  Location: [facility] INVASIVE CV LAB;  Service: Cardiovascular;  Laterality: N/A;   RIGHT HEART CATH AND CORONARY ANGIOGRAPHY N/A 10/06/2020   Procedure: RIGHT HEART CATH AND CORONARY ANGIOGRAPHY;  Surgeon: Wonda Sharper, MD;  Location[facility] INVASIVE CV LAB;  Service: Cardiovascular;  Laterality: N/A;   ROBOT ASSISTED LAPAROSCOPIC COMPLETE CYSTECT ILEAL CONDUIT     TRANSCATHETER AORTIC VALVE REPLACEMENT, TRANSFEMORAL Right 01/27/2021   Procedure: TRANSCATHETER AORTIC VALVE REPLACEMENT, TRANSFEMORAL;  Surgeon: Wonda Sharper, MD;  Location: [facility] INVASIVE CV LAB;  Service: Open Heart Surgery;  Laterality: Right;   US  ECHOCARDIOGRAPHY  09/08/2010   mild diastolic dysfunction,mild dilated LA,mild MR,AI,mildly dilated aortic root    Allergies: Amoxicillin, Doxycycline, and Flomax [tamsulosin hcl]  Medications: Prior to Admission medications  Medication Sig Start Date End Date Taking? Authorizing Provider  acetaminophen  (TYLENOL ) 500 MG tablet Take 1,000 mg by mouth every 6 (six) hours as needed for mild pain (pain score 1-3).   Yes [provider]  allopurinol  (ZYLOPRIM ) 100 MG tablet Take 1 tablet (100 mg total) by mouth daily. 03/12/23  Yes Gherghe, Costin M, MD  ALPRAZolam  (XANAX ) 0.25 MG tablet Take 1 tablet (0.25 mg total)  by mouth at bedtime. 05/12/23  Yes Swayze, Ava, DO  amiodarone  (PACERONE ) 200 MG tablet Take 1 tablet (200 mg total) by mouth daily. 02/23/24  Yes Jillian Buttery, MD  amLODipine  (NORVASC ) 10 MG tablet Take 1 tablet (10 mg total) by mouth daily. 02/23/24  Yes Jillian Buttery, MD  atorvastatin  (LIPITOR) 10 MG tablet Take 1 tablet (10 mg total) by mouth daily. Patient taking differently: Take 10 mg by mouth at bedtime. 03/12/23  Yes Gherghe, Costin M, MD   carvedilol  (COREG ) 12.5 MG tablet Take 1 tablet (12.5 mg total) by mouth 2 (two) times daily. 02/23/24  Yes Jillian Buttery, MD  cyanocobalamin  1000 MCG tablet Take 1 tablet (1,000 mcg total) by mouth daily. 03/12/23  Yes Gherghe, Costin M, MD  feeding supplement (ENSURE ENLIVE / ENSURE PLUS) LIQD Take 237 mLs by mouth 2 (two) times daily between meals. Patient taking differently: Take 237 mLs by mouth daily as needed. 05/11/23  Yes Swayze, Ava, DO  isosorbide  mononitrate (IMDUR ) 30 MG 24 hr tablet Take 1 tablet (30 mg total) by mouth daily. 02/23/24  Yes Jillian Buttery, MD  levothyroxine  (SYNTHROID ) 200 MCG tablet Take 200 mcg by mouth daily before breakfast.   Yes [provider]  mirtazapine  (REMERON ) 15 MG tablet Take 7.5 mg by mouth at bedtime. 08/09/20  Yes [provider]  nitroGLYCERIN  (NITROSTAT ) 0.4 MG SL tablet 1 tablet under the tongue and allow to dissolve as needed. Take every 5 minutes up to 3 times if chest pain persists Patient taking differently: Place 0.4 mg under the tongue every 5 (five) minutes as needed for chest pain. 02/03/24  Yes   sodium bicarbonate  650 MG tablet Take 2 tablets (1,300 mg) by mouth in the morning and evening 02/23/24 04/23/24 Yes Adhikari, Buttery, MD  torsemide  (DEMADEX ) 20 MG tablet Take 20 mg by mouth daily as needed (fluid). 01/06/24  Yes [provider]     Family History  Problem Relation Age of Onset   Ovarian cancer Mother    Colon cancer Mother    Colon cancer Father    Crohn's disease Son    Breast cancer Daughter    Colon cancer Brother     Social History   Socioeconomic History   Marital status: Divorced    Spouse name: Not on file   Number of children: 2   Years of education: Not on file   Highest education level: Not on file  Occupational History   Occupation: runner, broadcasting/film/video  Tobacco Use   Smoking status: Never   Smokeless tobacco: Never  Vaping Use   Vaping status: Never Used  Substance and Sexual Activity    Alcohol  use: Not Currently    Comment: occasionally   Drug use: No   Sexual activity: Not on file  Other Topics Concern   Not on file  Social History Narrative   Not on file   Social Drivers of Health   Tobacco Use: Low Risk (03/01/2024)   Patient History    Smoking Tobacco Use: Never    Smokeless Tobacco Use: Never    Passive Exposure: Not on file  Financial Resource Strain: Not on file  Food Insecurity: No Food Insecurity (02/28/2024)   Epic    Worried About Programme Researcher, Broadcasting/film/video in the Last Year: Never true    Ran Out of Food in the Last Year: Never true  Transportation Needs: No Transportation Needs (02/28/2024)   Epic    Lack of Transportation (Medical): No  Lack of Transportation (Non-Medical): No  Physical Activity: Not on file  Stress: Not on file  Social Connections: Unknown (02/28/2024)   Social Connection and Isolation Panel    Frequency of Communication with Friends and Family: Twice a week    Frequency of Social Gatherings with Friends and Family: Twice a week    Attends Religious Services: Patient declined    Database Administrator or Organizations: No    Attends Banker Meetings: Never    Marital Status: Patient declined  Depression (PHQ2-9): Not on file  Alcohol  Screen: Not on file  Housing: Low Risk (02/28/2024)   Epic    Unable to Pay for Housing in the Last Year: No    Number of Times Moved in the Last Year: 0    Homeless in the Last Year: No  Utilities: Not At Risk (02/28/2024)   Epic    Threatened with loss of utilities: No  Health Literacy: Not on file     Review of Systems: A 12 point ROS discussed and pertinent positives are indicated in the HPI above.  All other systems are negative.  Vital Signs: BP 130/62 (BP Location: Right Arm)   Pulse 69   Temp 98.5 F (36.9 C)   Resp 19   Ht 6' 1 (1.854 m)   Wt 177 lb 0.5 oz (80.3 kg) Comment: Bed weight  SpO2 95%   BMI 23.36 kg/m    Physical Exam Vitals reviewed.  Constitutional:       General: He is not in acute distress.    Comments: Pleasant elderly male, alert and oriented but appears tired.   HENT:     Head: Normocephalic and atraumatic.     Mouth/Throat:     Mouth: Mucous membranes are moist.     Pharynx: Oropharynx is clear.  Cardiovascular:     Rate and Rhythm: Normal rate and regular rhythm.     Heart sounds: Normal heart sounds.  Pulmonary:     Effort: Pulmonary effort is normal.  Abdominal:     General: Abdomen is flat. Bowel sounds are normal.     Palpations: Abdomen is soft.  Genitourinary:    Comments: + Foley catheter in place, gross hematuria in bag.  Musculoskeletal:     Cervical back: Neck supple.  Skin:    General: Skin is warm and dry.     Coloration: Skin is not jaundiced or pale.  Neurological:     Mental Status: He is oriented to person, place, and time.  Psychiatric:        Mood and Affect: Mood normal.        Behavior: Behavior normal.        Judgment: Judgment normal.    Advance Care Plan: The advanced care plan/surrogate decision maker was discussed at the time of visit and the patient did not wish to discuss or was not able to name a surrogate decision maker or provide an advance care plan.   MD Evaluation Airway: WNL Heart: WNL Abdomen: WNL Chest/ Lungs: WNL ASA  Classification: 3 Mallampati/Airway Score: Two  Imaging: DG Chest 2 View Result Date: 03/01/2024 CLINICAL DATA:  Pleural effusion EXAM: CHEST - 2 VIEW COMPARISON:  Two days ago FINDINGS: Stable cardiomegaly. Status post transcatheter aortic valve repair. Left-sided pacemaker is unchanged. Left lung is clear. Increased right pleural effusion is noted with associated atelectasis. Old distal right clavicular fracture is noted. IMPRESSION: Increased right pleural effusion is noted with associated atelectasis. Electronically Signed  By: Lynwood Landy Raddle M.D.   On: 03/01/2024 09:55   IR THORACENTESIS RIGHT ASP PLEURAL SPACE W/IMG GUIDE Result Date: 02/28/2024 INDICATION:  Patient with a history of metastatic bladder cancer presents today with new onset right pleural effusion. Interventional Radiology asked to perform a diagnostic and therapeutic thoracentesis. EXAM: ULTRASOUND GUIDED THORACENTESIS MEDICATIONS: 1% lidocaine  10 mL COMPLICATIONS: None immediate. PROCEDURE: An ultrasound guided thoracentesis was thoroughly discussed with the patient and questions answered. The benefits, risks, alternatives and complications were also discussed. The patient understands and wishes to proceed with the procedure. Written consent was obtained. Ultrasound was performed to localize and mark an adequate pocket of fluid in the right chest. The area was then prepped and draped in the normal sterile fashion. 1% Lidocaine  was used for local anesthesia. Under ultrasound guidance a 6 Fr Safe-T-Centesis catheter was introduced. Thoracentesis was performed. The catheter was removed and a dressing applied. FINDINGS: A total of approximately 1.05 L of bloody fluid was removed. Samples were sent to the laboratory as requested by the clinical team. IMPRESSION: Successful ultrasound guided right thoracentesis yielding 1.05 L of pleural fluid. Procedure performed by Leanor Forget, PA-C Electronically Signed   By: Ester Sides M.D.   On: 02/28/2024 16:42   DG Chest Port 1 View Result Date: 02/28/2024 EXAM: 1 VIEW(S) XRAY OF THE CHEST 02/28/2024 01:23:28 PM COMPARISON: 02/27/2024 CLINICAL HISTORY: Status post thoracentesis. FINDINGS: LINES, TUBES AND DEVICES: Post Transcatheter Aortic Valve Replacement (TAVR). Left chest cardiac pacing device. LUNGS AND PLEURA: Decreased right pleural effusion, now small with associated atelectasis. Persistent left retrocardiac airspace opacity. Small left pleural effusion. No pneumothorax. HEART AND MEDIASTINUM: Cardiomegaly. Atherosclerotic calcifications of aorta. BONES AND SOFT TISSUES: No acute osseous abnormality. IMPRESSION: 1. Decreased right pleural effusion,  small, with associated atelectasis. 2. Small left pleural effusion. 3. Persistent left retrocardiac airspace opacity. Electronically signed by: Norleen Boxer MD 02/28/2024 01:51 PM EST RP Workstation: [id number]   CT Chest Wo Contrast Result Date: 02/27/2024 CLINICAL DATA:  Respiratory illness, nondiagnostic chest x-ray, history of uroepithelial cancer with evidence of metastatic disease on recent abdominal CT EXAM: CT CHEST WITHOUT CONTRAST TECHNIQUE: Multidetector CT imaging of the chest was performed following the standard protocol without IV contrast. RADIATION DOSE REDUCTION: This exam was performed according to the departmental dose-optimization program which includes automated exposure control, adjustment of the mA and/or kV according to patient size and/or use of iterative reconstruction technique. COMPARISON:  02/27/2024, 03/12/2022 FINDINGS: Cardiovascular: Unenhanced imaging of the heart demonstrates borderline cardiomegaly. Aortic valve prosthesis is identified. There is a dual lead cardiac pacer, proximal lead in the right atrium and distal lead in the right ventricle. 4.2 cm ascending thoracic aortic aneurysm. Assessment of the vascular lumen cannot be performed without intravenous contrast. Atherosclerosis of the aorta and coronary vasculature. Mediastinum/Nodes: No enlarged mediastinal or axillary lymph nodes. Thyroid  gland, trachea, and esophagus demonstrate no significant findings. Lungs/Pleura: Moderate right pleural effusion, volume estimated less than 2 L. there is a 3.2 x 1.3 x 1.9 cm masslike hyperdense area along the posterior pleural surface within the right mid chest, reference image 73/2. A second hyperdense nodular area within the lateral right upper pleural surface reference image 53/2 measures approximate 1.0 x 2.2 x 1.2 cm. Findings are consistent with pleural metastases and malignant effusion. There is complete right lower lobe atelectasis, with significant compressive atelectasis in  the posterior aspect of the right upper lobe. Right middle lobe remains patent. Minimal scarring or subsegmental atelectasis within the left lower lobe. No  left effusion. No pneumothorax. Central airways are patent. Upper Abdomen: Partial visualization of the right-sided hydronephrosis noted on prior exam due to obstructing mass within the right ureter. The retroperitoneal adenopathy noted on prior CT is also partially visualized. Hypodense lesions throughout the liver are again noted consistent with hepatic metastases. Musculoskeletal: There are no acute displaced fractures. Reconstructed images demonstrate no additional findings. IMPRESSION: 1. Right-sided pleural based soft tissue nodules and moderate right pleural effusion, consistent with pleural metastases and right-sided malignant pleural effusion. 2. Stable findings within the upper abdomen compatible with the patient's known metastatic uroepithelial cancer. Please see CT abdomen and pelvis 02/09/2024 for full description of abdominal and pelvic findings. 3. 4.2 cm ascending thoracic aortic aneurysm. Assessment of the vascular lumen cannot be performed without intravenous contrast. Recommend annual imaging followup by CTA or MRA. This recommendation follows 2010 ACCF/AHA/AATS/ACR/ASA/SCA/SCAI/SIR/STS/SVM Guidelines for the Diagnosis and Management of Patients with Thoracic Aortic Disease. Circulation. 2010; 121: [id number]. Aortic aneurysm NOS (ICD10-I71.9) 4. Aortic Atherosclerosis (ICD10-I70.0). Coronary artery atherosclerosis. Electronically Signed   By: Ozell Daring M.D.   On: 02/27/2024 19:28   DG Chest 2 View Result Date: 02/27/2024 CLINICAL DATA:  Shortness of breath. EXAM: CHEST - 2 VIEW COMPARISON:  03/06/2023 FINDINGS: Left-sided pacemaker unchanged. Opacification over the right lung base compatible with moderate effusion with associated atelectasis. Patchy opacification which is mostly linear over the right midlung likely atelectasis. Infection  in the right base is possible. Left lung is clear. Stable cardiomegaly. Prior TAVR. Remainder of the exam is unchanged. IMPRESSION: 1. Findings suggesting moderate size right pleural effusion likely with associated basilar atelectasis. Patchy right mid lung opacification likely atelectasis. Infection over the right base is possible. 2.  Stable cardiomegaly. Electronically Signed   By: Toribio Agreste M.D.   On: 02/27/2024 16:33   CT ABDOMEN PELVIS WO CONTRAST Result Date: 02/09/2024 CLINICAL DATA:  Gross hematuria. Known uroepithelial cancer. history of prior left nephrectomy, cystoprostatectomy with urinary diversion. * Tracking Code: BO * EXAM: CT ABDOMEN AND PELVIS WITHOUT CONTRAST TECHNIQUE: Multidetector CT imaging of the abdomen and pelvis was performed following the standard protocol without IV contrast. RADIATION DOSE REDUCTION: This exam was performed according to the departmental dose-optimization program which includes automated exposure control, adjustment of the mA and/or kV according to patient size and/or use of iterative reconstruction technique. COMPARISON:  CT scan 03/12/2022 FINDINGS: Lower chest: Moderate-sized right pleural effusion with overlying atelectasis. No worrisome pulmonary lesions or pulmonary nodules. Stable aortic calcifications. Pacer wires are noted. Hepatobiliary: There are at least 4 new hepatic lesions. 17 mm lesion at the right hepatic dome in segment 8. Anterior segment 3 lesion on image 21/3 measures 15 mm. Slightly more medially and inferiorly is a second lesion measuring 10 mm. There is also a lesion and segment 5 peripherally measuring 13 mm. Findings consistent with metastatic disease. There are also small cysts which are stable. No intrahepatic biliary dilatation. Calcified gallstones are noted in a contracted gallbladder. No common bile duct dilatation. Pancreas: No mass, inflammation or ductal dilatation. Spleen: Normal size.  No focal lesions. Adrenals/Urinary Tract:  The adrenal glands are normal. The left kidney is surgically absent. The right kidney demonstrates hydronephrosis and duplicated collecting system. Extensive high attenuation material in the collecting system and in the upper left ureter with masslike area measuring a maximum of 3 cm. Findings are progressive when compared to the prior study and are consistent with uroepithelial neoplasm. Some of this could also be blood/hematoma. Status post cysto prostatectomy with a  right-sided urinary diversion. No complicating features. Stomach/Bowel: The stomach, duodenum, small bowel and colon are grossly normal. No obvious mass lesions or obstructive findings. Patient is a very large anterior abdominal wall hernia containing a significant amount of bowel but no findings for incarceration or obstruction. Vascular/Lymphatic: Advanced atherosclerotic calcification involving aorta, iliac arteries and branch vessels. New retroperitoneal lymphadenopathy. Right-sided node on image 29/3 measures 15 mm and left-sided node on image 32/3 measures 12 mm. No pelvic adenopathy. There is an enlarged right inguinal node measuring 17 mm. Reproductive: The prostate gland and seminal vesicles are surgically absent. Other: None Musculoskeletal: No definite lytic or sclerotic bone lesions to suggest metastatic disease. Healing/healed right seventh, eighth and ninth rib fractures. IMPRESSION: 1. Status post left nephrectomy and cystoprostatectomy with a right-sided urinary diversion. No complicating features. 2. Progressive high attenuation material in the right renal collecting system and upper right ureter with masslike area measuring a maximum of 3 cm. 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 Some of this could also be blood/hematoma. 3. New retroperitoneal and right inguinal lymphadenopathy consistent with metastatic disease. 4. New hepatic lesions consistent with metastatic disease. 5. Moderate-sized right pleural effusion with  overlying atelectasis. 6. Cholelithiasis. 7. Very large anterior abdominal wall hernia containing a significant amount of bowel but no findings for incarceration or obstruction. 8. Healing/healed right seventh, eighth and ninth rib fractures. 9. Aortic atherosclerosis. Aortic Atherosclerosis (ICD10-I70.0). Electronically Signed   By: MYRTIS Stammer M.D.   On: 02/09/2024 18:52   ECHOCARDIOGRAM COMPLETE Result Date: 02/08/2024    ECHOCARDIOGRAM REPORT   Patient Name:   Goldman ZEB RAWL Date of Exam: 02/08/2024 Medical Rec #:  [id number]         Height:       73.0 in Accession #:    [id number]        Weight:       190.6 lb Date of Birth:  27-Dec-1940         BSA:          2.108 m Patient Age:    83 years          BP:           95/51 mmHg Patient Gender: M                 HR:           65 bpm. Exam Location:  [address] Procedure: 2D Echo, Cardiac Doppler and Color Doppler (Both Spectral and Color            Flow Doppler were utilized during procedure). Indications:    I35.1 Nonrheumatic aortic (valve) insufficiency  History:        Patient has prior history of Echocardiogram examinations, most                 recent 03/06/2023. Risk Factors:Hypertension.                 Aortic Valve: 29 mm Edwards Sapien prosthetic, stented (TAVR)                 valve is present in the aortic position. Procedure Date:                 01/27/2021.  Sonographer:    Augustin Seals RDCS Referring Phys: [phone] MIHAI CROITORU IMPRESSIONS  1. Left ventricular ejection fraction, by estimation, is 50 to 55%. The left ventricle has low normal function. The left ventricle has no regional wall motion abnormalities. There is  moderate left ventricular hypertrophy. Left ventricular diastolic parameters are consistent with Grade I diastolic dysfunction (impaired relaxation).  2. Right ventricular systolic function is normal. The right ventricular size is mildly enlarged.  3. Left atrial size was moderately dilated.  4. The mitral valve is normal in  structure. Mild mitral valve regurgitation. No evidence of mitral stenosis.  5. The aortic valve has been repaired/replaced. Aortic valve regurgitation is not visualized. No aortic stenosis is present. There is a 29 mm Edwards Sapien prosthetic, stented Sapien prosthetic (TAVR) valve present in the aortic position. Procedure Date: 01/27/2021. Echo findings are consistent with normal structure and function of the aortic valve prosthesis. Aortic valve mean gradient measures 13.0 mmHg. Aortic valve Vmax measures 2.53 m/s.  6. The inferior vena cava is normal in size with greater than 50% respiratory variability, suggesting right atrial pressure of 3 mmHg. FINDINGS  Left Ventricle: Left ventricular ejection fraction, by estimation, is 50 to 55%. The left ventricle has low normal function. The left ventricle has no regional wall motion abnormalities. The left ventricular internal cavity size was normal in size. There is moderate left ventricular hypertrophy. Left ventricular diastolic parameters are consistent with Grade I diastolic dysfunction (impaired relaxation). Right Ventricle: The right ventricular size is mildly enlarged. No increase in right ventricular wall thickness. Right ventricular systolic function is normal. Left Atrium: Left atrial size was moderately dilated. Right Atrium: Right atrial size was normal in size. Pericardium: There is no evidence of pericardial effusion. Mitral Valve: The mitral valve is normal in structure. Mild mitral valve regurgitation. No evidence of mitral valve stenosis. Tricuspid Valve: The tricuspid valve is normal in structure. Tricuspid valve regurgitation is trivial. No evidence of tricuspid stenosis. Aortic Valve: The aortic valve has been repaired/replaced. Aortic valve regurgitation is not visualized. No aortic stenosis is present. Aortic valve mean gradient measures 13.0 mmHg. Aortic valve peak gradient measures 25.6 mmHg. Aortic valve area, by VTI measures 2.04 cm. There is  a 29 mm Edwards Sapien prosthetic, stented Sapien prosthetic, stented (TAVR) valve present in the aortic position. Procedure Date: 01/27/2021. Echo findings are consistent with normal structure and function of the aortic valve prosthesis. Pulmonic Valve: The pulmonic valve was normal in structure. Pulmonic valve regurgitation is not visualized. No evidence of pulmonic stenosis. Aorta: The aortic root is normal in size and structure. Venous: The inferior vena cava is normal in size with greater than 50% respiratory variability, suggesting right atrial pressure of 3 mmHg. IAS/Shunts: No atrial level shunt detected by color flow Doppler. Additional Comments: A device lead is visualized in the right ventricle.  LEFT VENTRICLE PLAX 2D LVIDd:         4.95 cm      Diastology LVIDs:         3.35 cm      LV e' medial:   5.92 cm/s LV PW:         1.30 cm      LV E/e' medial: 13.9 LV IVS:        1.45 cm LVOT diam:     2.40 cm LV SV:         108 LV SV Index:   51 LVOT Area:     4.52 cm  LV Volumes (MOD) LV vol d, MOD A2C: 128.0 ml LV vol d, MOD A4C: 99.6 ml LV vol s, MOD A2C: 56.9 ml LV vol s, MOD A4C: 50.8 ml LV SV MOD A2C:     71.1 ml LV SV MOD A4C:  99.6 ml LV SV MOD BP:      61.8 ml RIGHT VENTRICLE RV Basal diam:  4.50 cm     PULMONARY VEINS RV Mid diam:    3.60 cm     A Reversal Velocity: 32.30 cm/s RV S prime:     13.40 cm/s  Diastolic Velocity:  48.80 cm/s TAPSE (M-mode): 2.7 cm      S/D Velocity:        1.20                             Systolic Velocity:   60.30 cm/s LEFT ATRIUM             Index        RIGHT ATRIUM           Index LA diam:        4.60 cm 2.18 cm/m   RA Area:     17.60 cm LA Vol (A2C):   70.4 ml 33.40 ml/m  RA Volume:   45.90 ml  21.77 ml/m LA Vol (A4C):   86.4 ml 40.99 ml/m LA Biplane Vol: 81.2 ml 38.52 ml/m  AORTIC VALVE AV Area (Vmax):    2.02 cm AV Area (Vmean):   2.14 cm AV Area (VTI):     2.04 cm AV Vmax:           253.00 cm/s AV Vmean:          165.000 cm/s AV VTI:            0.527 m AV  Peak Grad:      25.6 mmHg AV Mean Grad:      13.0 mmHg LVOT Vmax:         113.00 cm/s LVOT Vmean:        78.100 cm/s LVOT VTI:          0.238 m LVOT/AV VTI ratio: 0.45  AORTA Ao Root diam: 4.30 cm Ao Asc diam:  4.30 cm MITRAL VALVE MV Area (PHT): 3.48 cm     SHUNTS MV Decel Time: 218 msec     Systemic VTI:  0.24 m MV E velocity: 82.30 cm/s   Systemic Diam: 2.40 cm MV A velocity: 112.50 cm/s MV E/A ratio:  0.73 Oneil Parchment MD Electronically signed by Oneil Parchment MD Signature Date/Time: 02/08/2024/2:10:24 PM    Final     Labs:  CBC: Recent Labs    02/23/24 0359 02/27/24 1449 02/28/24 0250 02/29/24 0439  WBC 3.7* 6.9 4.3 4.3  HGB 8.9* 9.9* 8.0* 8.9*  HCT 28.8* 33.9* 25.9* 28.2*  PLT 126* 124* 114* 118*    COAGS: Recent Labs    03/07/23 0500 03/07/23 1602 03/08/23 0059 03/08/23 1043 02/09/24 1833  INR  --   --   --   --  1.2  APTT 65* 58* 97* 91*  --     BMP: Recent Labs    02/23/24 0359 02/27/24 1733 02/28/24 0250 02/29/24 0439  NA 141 141 140 141  K 4.5 4.6 4.2 4.5  CL 112* 108 108 111  CO2 20* 21* 22 22  GLUCOSE 97 172* 95 98  BUN 46* 57* 60* 49*  CALCIUM  9.1 8.9 8.4* 8.5*  CREATININE 2.56* 3.27* 3.02* 2.51*  GFRNONAA 24* 18* 20* 25*    LIVER FUNCTION TESTS: Recent Labs    02/09/24 1833 02/10/24 0154 02/27/24 1733 02/28/24 0250  BILITOT 0.3 0.5 0.4 0.4  AST 17 16 41  40  ALT 12 9 47* 44  ALKPHOS 43 38 72 64  PROT 5.6* 4.9* 7.0 5.4*  ALBUMIN  3.6 3.1* 3.2* 2.9*    TUMOR MARKERS: No results for input(s): AFPTM, CEA, CA199, CHROMGRNA in the last 8760 hours.  Assessment and Plan: 84 y.o. male with metastatic bladder cancer and he is status post cystoscopy/urostomy, left total nephrectomy and right partial nephrectomy who was found to have right pleural base nodule with right pleural effusion, RP lymphadenopathy, and liver lesion. Patient wishes to proceed with liver lesion biopsy.   VSS CBC hgb 8.9, plt 118 on 2/4. Will repeat tomorrow AM.  INR  1.2 on 02/09/24, will repeat tomorrow AM.  AC/AP: none  Allergies reviewed   Risks and benefits of liver lesion biopsy was discussed with the patient and/or patient's family including, but not limited to bleeding, infection, damage to adjacent structures or low yield requiring additional tests.  All of the questions were answered and there is agreement to proceed.  Consent signed and in chart. Tentatively scheduled for tomorrow pending IR scheduler.   PLAN - NPO except meds at [place] - CBC and INR in AM - Will obtain consent for thoracentesis tomorrow, patient developed hypotension during last thora. Will prioritize biopsy as thoracentesis can be performed on weekend.  - Will need to discuss DNR during procedure when patient arrives in IR.    Thank you for this interesting consult.  I greatly enjoyed meeting Chriss P Isadore and look forward to participating in their care.  A copy of this report was sent to the requesting provider on this date.  Electronically Signed: Toya VEAR Cousin, PA-C 03/01/2024, 2:59 PM   I spent a total of 40 Minutes    in face to face in clinical consultation, greater than 50% of which was counseling/coordinating care for liver lesion biopsy.   This chart was dictated using voice recognition software.  Despite best efforts to proofread,  errors can occur which can change the documentation meaning.   "

## 2024-03-01 NOTE — Telephone Encounter (Signed)
 Patient was called regarding missed remote transmission. He is admitted but will have daughter send in transmission tonight.

## 2024-03-01 NOTE — Evaluation (Signed)
 Clinical/Bedside Swallow Evaluation Patient Details  Name: Jordan Oconnell MRN: [id number] Date of Birth: 1940-08-10  Today's Date: 03/01/2024 Time: SLP Start Time (ACUTE ONLY): 1325 SLP Stop Time (ACUTE ONLY): 1405 SLP Time Calculation (min) (ACUTE ONLY): 40 min  Past Medical History:  Past Medical History:  Diagnosis Date   Acute on chronic systolic CHF (congestive heart failure) (HCC) 03/13/2022   Acute renal failure superimposed on stage 4 chronic kidney disease (HCC) 03/13/2022   Atrial fibrillation with rapid ventricular response (HCC) 03/11/2022   Bladder cancer (HCC)    BPH (benign prostatic hyperplasia)    CAD (coronary artery disease)    CKD (chronic kidney disease), stage IV (HCC)    Colon polyps    Diverticulosis    GERD (gastroesophageal reflux disease)    Hyperchloremic metabolic acidosis    Hyperlipidemia    pt denies   Hypertension    LBBB (left bundle branch block)    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 Wonda and Dr. Lucas   Severe aortic stenosis 10/05/2020   Tachy-brady syndrome (HCC)    Toe pain, left 04/15/2023   Past Surgical History:  Past Surgical History:  Procedure Laterality Date   AMPUTATION TOE Right 03/02/2023   Procedure: AMPUTATION TOE, 4th;  Surgeon: Barton Drape, MD;  Location: [facility] ORS;  Service: Orthopedics;  Laterality: Right;   AMPUTATION TOE Left 05/04/2023   Procedure: AMPUTATION, TOE;  Surgeon: Barton Drape, MD;  Location: [facility];  Service: Orthopedics;  Laterality: Left;  left foot 4th toe metatarsalphalangeal joint disarticulation   APPENDECTOMY     BLADDER SURGERY  01/25/2006   transurethral resection/resection of prostatic urethra   BOWEL RESECTION      CARDIOVERSION N/A 03/03/2022   Procedure: CARDIOVERSION;  Surgeon: Lonni Slain, MD;  Location[facility] ENDOSCOPY;  Service: Cardiovascular;  Laterality: N/A;   CARDIOVERSION N/A 03/12/2022   Procedure: CARDIOVERSION;  Surgeon: Sheena Pugh, DO;  Location: [facility] ENDOSCOPY;  Service: Cardiovascular;  Laterality: N/A;   ESOPHAGOGASTRODUODENOSCOPY N/A 07/24/2013   Procedure: ESOPHAGOGASTRODUODENOSCOPY (EGD);  Surgeon: Norleen LOISE Kiang, MD;  Location: [facility];  Service: Endoscopy;  Laterality: N/A;   INTRAOPERATIVE TRANSTHORACIC ECHOCARDIOGRAM N/A 01/27/2021   Procedure: INTRAOPERATIVE TRANSTHORACIC ECHOCARDIOGRAM;  Surgeon: Wonda Sharper, MD;  Location[facility] INVASIVE CV LAB;  Service: Open Heart Surgery;  Laterality: N/A;   IR FLUORO GUIDE CV LINE RIGHT  03/04/2023   IR REMOVAL TUN CV CATH W/O FL  05/09/2023   IR THORACENTESIS RIGHT ASP PLEURAL SPACE W/IMG GUIDE  02/28/2024   IR US  GUIDE VASC ACCESS RIGHT  03/04/2023   laparoscopic surgery  01/25/2010   laser   (?)   NEPHRECTOMY  01/25/2005   partial, left   NEPHROSTOMY  01/25/2009   stent   NM MYOCAR PERF WALL MOTION  09/08/2010   Normal   PACEMAKER IMPLANT N/A 02/12/2021   Procedure: PACEMAKER IMPLANT;  Surgeon: Cindie Ole DASEN, MD;  Location: [facility] INVASIVE CV LAB;  Service: Cardiovascular;  Laterality: N/A;   RIGHT HEART CATH AND CORONARY ANGIOGRAPHY N/A 10/06/2020   Procedure: RIGHT HEART CATH AND CORONARY ANGIOGRAPHY;  Surgeon: Wonda Sharper, MD;  Location: [facility] INVASIVE CV LAB;  Service: Cardiovascular;  Laterality: N/A;   ROBOT ASSISTED LAPAROSCOPIC  COMPLETE CYSTECT ILEAL CONDUIT     TRANSCATHETER AORTIC VALVE REPLACEMENT, TRANSFEMORAL Right 01/27/2021   Procedure: TRANSCATHETER AORTIC VALVE REPLACEMENT, TRANSFEMORAL;  Surgeon: Wonda Sharper, MD;  Location: [facility] INVASIVE CV LAB;  Service: Open Heart Surgery;  Laterality: Right;   US  ECHOCARDIOGRAPHY  09/08/2010   mild diastolic dysfunction,mild dilated LA,mild MR,AI,mildly dilated  aortic root   HPI:  84yo male admitted from home 02/27/24 with weakness and GI problem. CXR: mod right pleural effusion, likely basilar atelectasis, pathy RML opacification likely atelectasis, RLL infection possible. CTChest: lung mets. BSE 02/2022: Reg/thin, meds whole/liquid. PMH: admit 1/15-1/28/26 for anemia/hematuria 2/2 bladder cancer, metastatic bladder/urothelial cancer, s/p cystectomy and partial R nephrectomy, total nephrectomy, GERD, HLD, HTN, AFib, hypothyroidism, CKD, tachybradycardia, depression, anemia, AKI, CKD.    Assessment / Plan / Recommendation  Clinical Impression  PLAN:  Continue regular diet/thin liquid with adherence to safe swallow precautions - small bites/sips at slow rate, UPRIGHT as tolerated during and 20-30 minutes after meals. Choose soft solids for energy conservation. Will request additional sauce/gravy for moisture.   Pt presents with adequate natural dentition, missing several molars. CN exam unremarkable. Pt accepted trials of ice chips, thin liquid via cup and straw, puree, and solid textures. Pt exhibited difficulty with triscuit, likely due to the dry and particulate nature of this cracker. No obvious oral issues or overt s/s aspiration observed with ice chips, thin liquid, or puree textures. Suspect difficulty/coughing earlier today is due at least in part to positioning and weakness/fatigue. Pt required encouragement to be seated at 47 degrees, and to remain at 38 degrees following PO presentations. Pt/daughter receptive to education regarding the importance of small bites/sips at slow rate, UPRIGHT position (as much as tolerated) during and 20-30 min after meals. Belching noted during this session, raising concern for esophageal issues.   At this time, will continue regular diet to allow full range of PO choices. Will request additional sauce/gravy for moisture. Pt was encouraged to choose softer solids for energy conservation. Thin liquid ok via cup or straw. Meds  as tolerated. Safe swallow precautions posted at [facility] and reviewed with pt/daughter. ST will follow up next date to assess diet tolerance and determine if instrumental assessment is needed. RN/MD/RD informed.  SLP Visit Diagnosis: Dysphagia, unspecified (R13.10)    Aspiration Risk  Mild aspiration risk    Diet Recommendation Regular;Thin liquid    Liquid Administration via: Cup;Straw Medication Administration: Whole meds with liquid Supervision: Patient able to self feed;Intermittent supervision to cue for compensatory strategies Compensations: Minimize environmental distractions;Slow rate;Small sips/bites Postural Changes: Seated upright at 90 degrees;Remain upright for at least 30 minutes after po intake    Other Recommendations Oral Care Recommendations: Oral care BID     Swallow Evaluation Recommendations  Reg/thin, small bites/sips at slow rate, upright position during/after meals   Assistance Recommended at Discharge  TBD  Functional Status Assessment Patient has had a recent decline in their functional status and demonstrates the ability to make significant improvements in function in a reasonable and predictable amount of time.   Frequency and Duration min 1 x/week  2 weeks       Prognosis Prognosis for improved oropharyngeal function: Good      Swallow Study   General Date of Onset: 02/27/24 HPI: 84yo male admitted from home 02/27/24 with weakness and GI problem. CXR: mod right pleural effusion, likely basilar atelectasis, pathy RML opacification likely atelectasis, RLL infection possible. CTChest: lung mets. BSE 02/2022: Reg/thin, meds whole/liquid. PMH: admit 1/15-1/28/26 for anemia/hematuria  2/2 bladder cancer, metastatic bladder/urothelial cancer, s/p cystectomy and partial R nephrectomy, total nephrectomy, GERD, HLD, HTN, AFib, hypothyroidism, CKD, tachybradycardia, depression, anemia, AKI, CKD. Type of Study: Bedside Swallow Evaluation Previous Swallow Assessment: BSE  02/2022: reg/thin, meds whole with liquid Diet Prior to this Study: Regular;Thin liquids (Level 0) Temperature Spikes Noted: No Respiratory Status: Room air History of Recent Intubation: No Behavior/Cognition: Alert;Cooperative;Pleasant mood Oral Cavity Assessment: Within Functional Limits Oral Cavity - Dentition: Adequate natural dentition;Missing dentition Vision: Functional for self-feeding Self-Feeding Abilities: Able to feed self Patient Positioning: Upright in bed Baseline Vocal Quality: Hoarse Volitional Cough: Weak Volitional Swallow: Able to elicit    Oral/Motor/Sensory Function Overall Oral Motor/Sensory Function: Within functional limits   Ice Chips Ice chips: Within functional limits Presentation: Spoon   Thin Liquid Thin Liquid: Within functional limits Presentation: Cup;Self Fed;Straw    Nectar Thick Nectar Thick Liquid: Not tested   Honey Thick Honey Thick Liquid: Not tested   Puree Puree: Within functional limits Presentation: Spoon   Solid     Solid: Impaired Presentation: Self Fed Pharyngeal Phase Impairments: Cough - Immediate Other Comments: pt exhibited difficulty with triscuit - likely due to particulate dry nature of this cracker     Tremaine Earwood B. Dory, MSP, CCC-SLP Speech Language Pathologist Office: [phone]  Dory Caprice Daring 03/01/2024,2:20 PM

## 2024-03-01 NOTE — Progress Notes (Signed)
 Physical Therapy Treatment Patient Details Name: Jordan Oconnell MRN: [id number] DOB: 05/02/1940 Today's Date: 03/01/2024   History of Present Illness Jordan Oconnell is a 84 y.o. male who presented due to significant fatigue. CT shows new mets to the Lungs. PMHx: recent admit 1/15-1/28, bladder cancer/urothelial cancer, s/p cystectomy and partial R nephrectomy, total nephrectomy, GERD, HLD, HTN, hypothyroidism, CKD, tachybradycardia syndrome, anemia, AKI, CKD.   PT Comments  Pt progressed mobility, ambulating into hallway using RW with CGA and a close chair follow. He demonstrated good insight into activity tolerance requesting a seated rest break as he fatigued. Pt reported a 7/10 on the modified RPE scale at end of session. Educated pt on use of incentive spirometer and instructed him to complete 10 reps every hour. He demonstrated fair technique with cues for improved performance reaching slightly above consistently. Encouraged pt to ambulate frequently with assist and complete OOB<>chair at least 3x/day.      If plan is discharge home, recommend the following: Assistance with cooking/housework;Help with stairs or ramp for entrance;Assist for transportation;A little help with walking and/or transfers;A lot of help with bathing/dressing/bathroom   Can travel by private vehicle     Yes  Equipment Recommendations  None recommended by PT    Recommendations for Other Services       Precautions / Restrictions Precautions Precautions: Fall Recall of Precautions/Restrictions: Intact Restrictions Weight Bearing Restrictions Per Provider Order: No     Mobility  Bed Mobility Overal bed mobility: Needs Assistance Bed Mobility: Supine to Sit, Sit to Supine     Supine to sit: Supervision, HOB elevated, Used rails Sit to supine: Supervision, HOB elevated   General bed mobility comments: Pt returned to bed to rest after going to the bathroom. He brought BLE back into bed and eased  trunk down. HOB elevated to 33deg. Pt sat up on R side of bed with increased time. He managed trunk/BLE without assist. Sup for safety.    Transfers Overall transfer level: Needs assistance Equipment used: Rolling walker (2 wheels) Transfers: Sit to/from Stand, Bed to chair/wheelchair/BSC Sit to Stand: Min assist, Contact guard assist   Step pivot transfers: Contact guard assist       General transfer comment: Pt greeted in bathroom. He stood from commode with minA. Transferred back to bed. Pt stood from lowest bed height with CGA after a prolonged rest break. He demonstrated proper hand placement using RW. Fair eccentric control.    Ambulation/Gait Ambulation/Gait assistance: +2 safety/equipment, Contact guard assist (chair follow) Gait Distance (Feet): 100 Feet Assistive device: Rolling walker (2 wheels) Gait Pattern/deviations: Step-through pattern, Decreased stride length, Trunk flexed Gait velocity: decr Gait velocity interpretation: <1.31 ft/sec, indicative of household ambulator   General Gait Details: Pt took slow steady steps with limited foot clearance. Cues for upright posture with closer proximity to RW. Noted dyspnea on exertion. Cued PLB technique. Pt with good insight to decreased activity tolerance requesting a seated rest break.   Stairs             Wheelchair Mobility     Tilt Bed    Modified Rankin (Stroke Patients Only)       Balance Overall balance assessment: Needs assistance Sitting-balance support: Feet supported, No upper extremity supported Sitting balance-Leahy Scale: Fair Sitting balance - Comments: Sat EOB with close supervision-CGA   Standing balance support: Bilateral upper extremity supported, During functional activity, Reliant on assistive device for balance Standing balance-Leahy Scale: Poor Standing balance comment: Pt dependent on RW.  He required assist for pericare after standing from commode.                             Communication Communication Communication: Impaired Factors Affecting Communication: Hearing impaired (hearing aids)  Cognition Arousal: Alert Behavior During Therapy: WFL for tasks assessed/performed   PT - Cognitive impairments: No apparent impairments                         Following commands: Intact      Cueing Cueing Techniques: Verbal cues  Exercises Other Exercises Other Exercises: Incentive Spirometer: provided pt with device and educated pt on proper technique. Instructed him to complete 10 reps every hour. Emphasis on slow and controlled deep inhales with breaks for regular breaths before using device again. Pt demonstrated ability to reach <1,057mL but >779mL consistently x10 reps. Cues for improved technique.    General Comments General comments (skin integrity, edema, etc.): Daughter present and supportive throughout session. SpO2 88-91% on RA.      Pertinent Vitals/Pain Pain Assessment Pain Assessment: No/denies pain    Home Living                          Prior Function            PT Goals (current goals can now be found in the care plan section) Acute Rehab PT Goals Patient Stated Goal: Regain independence PT Goal Formulation: With patient/family Time For Goal Achievement: 03/13/24 Potential to Achieve Goals: Good Progress towards PT goals: Progressing toward goals    Frequency    Min 2X/week      PT Plan      Co-evaluation              AM-PAC PT 6 Clicks Mobility   Outcome Measure  Help needed turning from your back to your side while in a flat bed without using bedrails?: A Little Help needed moving from lying on your back to sitting on the side of a flat bed without using bedrails?: A Little Help needed moving to and from a bed to a chair (including a wheelchair)?: A Little Help needed standing up from a chair using your arms (e.g., wheelchair or bedside chair)?: A Little Help needed to walk in  hospital room?: A Little Help needed climbing 3-5 steps with a railing? : A Lot 6 Click Score: 17    End of Session Equipment Utilized During Treatment: Gait belt Activity Tolerance: Patient limited by fatigue Patient left: in chair;with call bell/phone within reach;with chair alarm set Nurse Communication: Mobility status PT Visit Diagnosis: Other abnormalities of gait and mobility (R26.89);Muscle weakness (generalized) (M62.81)     Time: 8897-8865 PT Time Calculation (min) (ACUTE ONLY): 32 min  Charges:    $Gait Training: 8-22 mins $Therapeutic Activity: 8-22 mins                  Jordan Oconnell, PT, DPT Acute Rehabilitation Services Office: [phone] Secure Chat Preferred  Jordan Oconnell 03/01/2024, 11:58 AM

## 2024-03-01 NOTE — Progress Notes (Signed)
 IP PROGRESS NOTE  Subjective:   Jordan Oconnell appeared stable when I saw him at approximately 6:30 AM.  His daughter was at the bedside.  He continues to have generalized weakness.  No new complaint.  Dyspnea improved following the thoracentesis.  Objective: Vital signs in last 24 hours: Blood pressure 130/62, pulse 69, temperature 98.5 F (36.9 C), resp. rate 19, height 6' 1 (1.854 m), weight 177 lb 0.5 oz (80.3 kg), SpO2 95%.  Intake/Output from previous day: 02/04 0701 - 02/05 0700 In: 120 [P.O.:120] Out: 1850 [Urine:1850]  Physical Exam:  Lungs: Decreased breath sounds at the right lower posterior chest, dullness at the right lower chest Cardiac: Regular rhythm, distant heart sounds Abdomen: No hepatosplenomegaly, right abdomen urostomy with blood-tinged urine Extremities: No leg edema   Lab Results: Recent Labs    02/28/24 0250 02/29/24 0439  WBC 4.3 4.3  HGB 8.0* 8.9*  HCT 25.9* 28.2*  PLT 114* 118*    BMET Recent Labs    02/28/24 0250 02/29/24 0439  NA 140 141  K 4.2 4.5  CL 108 111  CO2 22 22  GLUCOSE 95 98  BUN 60* 49*  CREATININE 3.02* 2.51*  CALCIUM  8.4* 8.5*    No results found for: CEA1, CEA, CAN199, CA125  Studies/Results: IR THORACENTESIS RIGHT ASP PLEURAL SPACE W/IMG GUIDE Result Date: 02/28/2024 INDICATION: Patient with a history of metastatic bladder cancer presents today with new onset right pleural effusion. Interventional Radiology asked to perform a diagnostic and therapeutic thoracentesis. EXAM: ULTRASOUND GUIDED THORACENTESIS MEDICATIONS: 1% lidocaine  10 mL COMPLICATIONS: None immediate. PROCEDURE: An ultrasound guided thoracentesis was thoroughly discussed with the patient and questions answered. The benefits, risks, alternatives and complications were also discussed. The patient understands and wishes to proceed with the procedure. Written consent was obtained. Ultrasound was performed to localize and mark an adequate pocket of  fluid in the right chest. The area was then prepped and draped in the normal sterile fashion. 1% Lidocaine  was used for local anesthesia. Under ultrasound guidance a 6 Fr Safe-T-Centesis catheter was introduced. Thoracentesis was performed. The catheter was removed and a dressing applied. FINDINGS: A total of approximately 1.05 L of bloody fluid was removed. Samples were sent to the laboratory as requested by the clinical team. IMPRESSION: Successful ultrasound guided right thoracentesis yielding 1.05 L of pleural fluid. Procedure performed by Jordan Forget, PA-C Electronically Signed   By: Jordan Oconnell M.D.   On: 02/28/2024 16:42   DG Chest Port 1 View Result Date: 02/28/2024 EXAM: 1 VIEW(S) XRAY OF THE CHEST 02/28/2024 01:23:28 PM COMPARISON: 02/27/2024 CLINICAL HISTORY: Status post thoracentesis. FINDINGS: LINES, TUBES AND DEVICES: Post Transcatheter Aortic Valve Replacement (TAVR). Left chest cardiac pacing device. LUNGS AND PLEURA: Decreased right pleural effusion, now small with associated atelectasis. Persistent left retrocardiac airspace opacity. Small left pleural effusion. No pneumothorax. HEART AND MEDIASTINUM: Cardiomegaly. Atherosclerotic calcifications of aorta. BONES AND SOFT TISSUES: No acute osseous abnormality. IMPRESSION: 1. Decreased right pleural effusion, small, with associated atelectasis. 2. Small left pleural effusion. 3. Persistent left retrocardiac airspace opacity. Electronically signed by: Jordan Boxer MD 02/28/2024 01:51 PM EST RP Workstation: [id number]    Medications: I have reviewed the patient's current medications.  Assessment/Plan: Right ureter cancer Initial diagnosis in 2012, treated with URS 02/26/2013 right complete ureterectomy 11/20/2020, urothelial thickening with masslike enhancement of the right renal pelvis on CT, focal areas of enhancing soft tissue in the proximal right ileal ureter 04/13/2021 right nephroscopy, diffuse disease of the right renal  pelvis, papillary  urothelial carcinoma, high-grade, noninvasive-muscularis propria not present 06/09/2021 pembrolizumab -04/21/2022 10/22/2021 CTs, right renal pelvis mass increased from 2.6 to 3.8 cm 12/20/2021 - 12/04/2021, right renal radiation 3000 cGy in 5 fractions Foundation 1: TMB 14, positive FGFR3 mutation, ATM mutation, TERT promoter mutation, BRCA1 duplication 05/06/2022-right renal pelvis mass less prominent 09/04/2022, decreasing inflammatory changes at the right renal pelvis with mild persistent urothelial thickening 02/08/2014 CT abdomen/pelvis: Progressive high attenuation material in the right renal collecting system and upper right ureter with a masslike area measuring 3 cm, new retroperitoneal and right inguinal lymphadenopathy, new hepatic lesions consistent with metastases, moderate right pleural effusion 02/27/2024 CT chest: Right pleural-based soft tissue nodules, moderate right pleural effusion 02/28/2024 right thoracentesis-atypical cells   2.  Bladder cancer March 2008 TURBT: Chronic cystitis November 2009 TURBT: Papillary hyperplasia December 2009 BCG: 6-week induction 06/21/2008 TURBT, TA, LG, prostatic urethral involvement July 2010 BCG 6-week induction 12/06/2008 TURBT: TA, LG December 2010 BCG 6-week induction 08/25/2002 TURBT TA, H G 09/06/2012 PET/CT: Pelvic lymphadenopathy 02/26/2013 cystoprostatectomy plus reversed 7 diversion, pTis,N2,M0,R0 06/25/2008 revision reverse 7 urinary diversion 07/05/2019 MRI: NAD, stable right adrenal cyst, chest x-ray: Stable, NED 07/09/2020 CT CAP CAP plus chest x-ray-NAD      3.  Cancer of the left renal pelvis and ureter 09/30/2009 left URS plus laser: TA, LG October 2011-left PCN, gemcitabine 6-week induction 08/24/2012 left URS plus laser: Multifocal recurrences, H G, incomplete resection 02/26/2013 left complete ureterectomy 05/05/2017 multifocal recurrence in the renal pelvis, H G 07/01/2016 left nephrectomy, pT1 NX M0 R0, high-grade  urothelial carcinoma with squamous differentiation, 4.1 cm   4.   Hematuria, likely secondary to tumor involving the right renal pelvis 5.   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   Paroxysmal atrial fibrillation-currently maintained off of anticoagulation 7.   Aortic stenosis, status post TAVR 8.   Renal insufficiency 9.  History of CAD/CHF 10.  Left renal cell carcinoma, June 2008 left partial nephrectomy, clear-cell histology     Jordan Oconnell appears unchanged.  The pleural fluid cytology was nondiagnostic.  We discussed the indication for pursuing a repeat thoracentesis versus a directed biopsy in order to obtain a diagnosis.  Jordan Oconnell would like to proceed with additional testing in order to obtain a diagnosis.  He would like to consider treatment options if a diagnosis of metastatic urothelial carcinoma is confirmed.  I discussed the case with Dr. Raynaldo yesterday.  He agrees with a plan for comfort care if Jordan Oconnell performance status does not improve.  We can also consider enfortumab and FGFR3 directed therapy if he improves following a rehab stay.  Jordan Oconnell plans to enter a SNF for physical therapy at discharge.  He has persistent anemia secondary to GU blood loss, renal insufficiency, and chronic disease.  Recommendations:  Repeat chest x-ray, diagnostic/therapeutic thoracentesis if there is significant right pleural fluid IR evaluation to consider a directed biopsy, pleural-based mass versus liver metastasis if the repeat pleural fluid is again nondiagnostic Monitor hemoglobin periodically, transfuse packed red blood cells as needed for symptomatic anemia Skilled nursing facility placement Outpatient follow-up at the Cancer center   LOS: 2 days   Arley Hof, MD   03/01/2024, 8:03 AM

## 2024-03-02 ENCOUNTER — Inpatient Hospital Stay (HOSPITAL_COMMUNITY)

## 2024-03-02 LAB — BASIC METABOLIC PANEL WITH GFR
Anion gap: 8 (ref 5–15)
BUN: 42 mg/dL — ABNORMAL HIGH (ref 8–23)
CO2: 21 mmol/L — ABNORMAL LOW (ref 22–32)
Calcium: 8.3 mg/dL — ABNORMAL LOW (ref 8.9–10.3)
Chloride: 107 mmol/L (ref 98–111)
Creatinine, Ser: 2.45 mg/dL — ABNORMAL HIGH (ref 0.61–1.24)
GFR, Estimated: 25 mL/min — ABNORMAL LOW
Glucose, Bld: 103 mg/dL — ABNORMAL HIGH (ref 70–99)
Potassium: 4.8 mmol/L (ref 3.5–5.1)
Sodium: 136 mmol/L (ref 135–145)

## 2024-03-02 LAB — PROTIME-INR
INR: 1.1 (ref 0.8–1.2)
Prothrombin Time: 15.2 s (ref 11.4–15.2)

## 2024-03-02 LAB — CBC WITH DIFFERENTIAL/PLATELET
Abs Immature Granulocytes: 0.26 10*3/uL — ABNORMAL HIGH (ref 0.00–0.07)
Basophils Absolute: 0 10*3/uL (ref 0.0–0.1)
Basophils Relative: 1 %
Eosinophils Absolute: 0.1 10*3/uL (ref 0.0–0.5)
Eosinophils Relative: 4 %
HCT: 28.5 % — ABNORMAL LOW (ref 39.0–52.0)
Hemoglobin: 9 g/dL — ABNORMAL LOW (ref 13.0–17.0)
Immature Granulocytes: 7 %
Lymphocytes Relative: 27 %
Lymphs Abs: 1 10*3/uL (ref 0.7–4.0)
MCH: 28.3 pg (ref 26.0–34.0)
MCHC: 31.6 g/dL (ref 30.0–36.0)
MCV: 89.6 fL (ref 80.0–100.0)
Monocytes Absolute: 0.5 10*3/uL (ref 0.1–1.0)
Monocytes Relative: 14 %
Neutro Abs: 1.8 10*3/uL (ref 1.7–7.7)
Neutrophils Relative %: 47 %
Platelets: 120 10*3/uL — ABNORMAL LOW (ref 150–400)
RBC: 3.18 MIL/uL — ABNORMAL LOW (ref 4.22–5.81)
RDW: 16.5 % — ABNORMAL HIGH (ref 11.5–15.5)
Smear Review: NORMAL
WBC: 3.7 10*3/uL — ABNORMAL LOW (ref 4.0–10.5)
nRBC: 0 % (ref 0.0–0.2)

## 2024-03-02 LAB — BODY FLUID CULTURE W GRAM STAIN
Culture: NO GROWTH
Gram Stain: NONE SEEN

## 2024-03-02 LAB — ZINC: Zinc: 54 ug/dL (ref 44–115)

## 2024-03-02 MED ORDER — MIDAZOLAM HCL (PF) 2 MG/2ML IJ SOLN
INTRAMUSCULAR | Status: AC | PRN
  Administered 2024-03-02: 1 mg via INTRAVENOUS
  Administered 2024-03-02: .5 mg via INTRAVENOUS

## 2024-03-02 MED ORDER — FENTANYL CITRATE (PF) 100 MCG/2ML IJ SOLN
INTRAMUSCULAR | Status: AC | PRN
  Administered 2024-03-02 (×2): 25 ug via INTRAVENOUS

## 2024-03-02 MED ORDER — LIDOCAINE HCL (PF) 1 % IJ SOLN
10.0000 mL | Freq: Once | INTRAMUSCULAR | Status: AC
  Administered 2024-03-02: 10 mL via INTRADERMAL

## 2024-03-02 MED ORDER — LIDOCAINE-EPINEPHRINE 1 %-1:100000 IJ SOLN
INTRAMUSCULAR | Status: AC
  Filled 2024-03-02: qty 20

## 2024-03-02 MED ORDER — MIDAZOLAM HCL 2 MG/2ML IJ SOLN
INTRAMUSCULAR | Status: AC
  Filled 2024-03-02: qty 4

## 2024-03-02 MED ORDER — ALPRAZOLAM 0.25 MG PO TABS: 0.2500 mg | ORAL_TABLET | Freq: Every day | ORAL | 0 refills | Status: DC

## 2024-03-02 MED ORDER — ALLOPURINOL 100 MG PO TABS: 50.0000 mg | ORAL_TABLET | Freq: Every day | ORAL | Status: AC

## 2024-03-02 MED ORDER — FENTANYL CITRATE (PF) 100 MCG/2ML IJ SOLN
INTRAMUSCULAR | Status: AC
  Filled 2024-03-02: qty 4

## 2024-03-02 MED ORDER — VITAMIN D (ERGOCALCIFEROL) 1.25 MG (50000 UNIT) PO CAPS: 50000.0000 [IU] | ORAL_CAPSULE | ORAL | Status: AC

## 2024-03-02 MED ORDER — ADULT MULTIVITAMIN W/MINERALS CH: 1.0000 | ORAL_TABLET | Freq: Every day | ORAL | Status: AC

## 2024-03-02 MED ORDER — LIDOCAINE-EPINEPHRINE 1 %-1:100000 IJ SOLN
20.0000 mL | Freq: Once | INTRAMUSCULAR | Status: AC
  Administered 2024-03-02: 20 mL via INTRADERMAL

## 2024-03-02 MED ORDER — HYDROCODONE-ACETAMINOPHEN 5-325 MG PO TABS: 1.0000 | ORAL_TABLET | Freq: Four times a day (QID) | ORAL | 0 refills | Status: DC | PRN

## 2024-03-02 MED ORDER — GELATIN ABSORBABLE 12-7 MM EX MISC
1.0000 | Freq: Once | CUTANEOUS | Status: AC
  Administered 2024-03-02: 1 via TOPICAL
  Filled 2024-03-02: qty 1

## 2024-03-02 MED ORDER — SODIUM ZIRCONIUM CYCLOSILICATE 10 G PO PACK
10.0000 g | PACK | Freq: Once | ORAL | Status: AC
  Administered 2024-03-02: 10 g via ORAL
  Filled 2024-03-02: qty 1

## 2024-03-02 MED ORDER — VITAMIN B-1 100 MG PO TABS: 100.0000 mg | ORAL_TABLET | Freq: Every day | ORAL | Status: AC

## 2024-03-02 MED ORDER — SENNOSIDES-DOCUSATE SODIUM 8.6-50 MG PO TABS: 2.0000 | ORAL_TABLET | Freq: Two times a day (BID) | ORAL | Status: AC | PRN

## 2024-03-02 MED ORDER — ALPRAZOLAM 0.25 MG PO TABS: 0.2500 mg | ORAL_TABLET | Freq: Every day | ORAL | 0 refills | Status: AC

## 2024-03-02 NOTE — Progress Notes (Signed)
 " PROGRESS NOTE  Jordan Oconnell FMW:[id number] DOB: 1940-10-18   PCP: Shepard Ade, MD  Patient is from: Home  DOA: 02/27/2024 LOS: 3  Chief complaints Chief Complaint  Patient presents with   Weakness   GI Problem     Brief Narrative / Interim history: 84 year old M with PMH of bladder cancer s/p left nephrectomy, partial right nephrectomy and urostomy with mets to pleura and liver, HFmrEF, aortic stenosis s/p TAVR, anemia, hypertension, hyperlipidemia, GERD, hypothyroidism, depression presenting with significant fatigue and hematuria, and admitted with working diagnosis of generalized weakness,  dehydration, hypotension, pleural effusion and gross hematuria.    Patient follows at [facility] with urology and oncology.  He was recently admitted from 1/15 - 02/23/2024 for severe anemia, fatigue, gross hematuria requiring PRBC transfusion.  Hospital course complicated by UTI requiring Rocephin  for 5 days.     In ED, BP 83/47 but improved quickly with IV fluid. Cr 3.27 (baseline 2.4-2.5).  BUN 57.  proBNP 950.  High-sensitivity troponin 96.  Hgb 8.0 (baseline 8-9).  UA bloody with many bacteria. CT chest concerning for pleural and liver metastasis, and right-sided malignant pleural effusion.  Cultures obtained.  Thoracocentesis ordered.  Patient was started on IV fluid and antibiotics and admitted for further care.  IR consulted for thoracocentesis.  Oncology and palliative consulted as well.   Patient underwent right thoracocentesis with removal of 950 cc fluid on 2/3.  Pleural fluid culture and cytology negative.  Repeat chest x-ray with recurrence of right pleural effusion with associated atelectasis.  He underwent ultrasound-guided liver biopsy and repeat right thoracocentesis with removal of 1.6 L sanguinous fluid on 2/6.  Patient is cleared for discharge for outpatient follow-up.  Oncology, Dr. Cloretta to follow-up on cytology and biopsy results, and arrange outpatient follow-up.   Therapy  recommended SNF.  Medically stable for discharge.    Subjective: Seen and examined earlier this morning.  No major events overnight or this morning after he returned from liver biopsy.  Patient's daughter at bedside.  Patient has no complaints other than the lack of sleep in the hospital.   Assessment and plan: Generalized weakness Dehydration: Resolved. Vitamin D  deficiency: Vitamin D  level 9.6. -Encourage oral hydration -Continue vitamin D  supplementation -Continue therapy at SNF.   History of high-grade uroepithelial cancer s/p total left nephrectomy, partial right nephrectomy and urostomy Pleural and hepatic metastases likely from bladder cancer. Right malignant pleural effusion -S/p right thoracocentesis with removal of 950 cc on 2/3. -Repeat CXR on 2/5 with increased right pleural effusion again. -S/p repeat right thoracocentesis with removal of 1.6 L on 2/6.  Oncology to follow-up on cytology. -S/p US  guided liver biopsy on 2/6.  Oncology to follow-up on pathology. -Oncology to arrange outpatient follow-up. -Note that patient gets his oncology and urology care at [facility].     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  Treated with TXA from 1/21 - 1/23.  Anticoagulation held and outpatient follow-up recommended.  H&H stable.   -Outpatient follow-up with urology -Continue urostomy care.   PAF: Rate controlled.  Off anticoagulation due to gross hematuria. Aortic stenosis status post history of TAVR -Continue Coreg  and amiodarone .   - Eliquis  held during recent admission due to hematuria.    AKI on CKD-3B: b/l creatinine 2.4-2.5.  K4.8.  AKI resolved. -P.o. Lokelma  10 g x 1 -Repeat renal function in 1 week   Chronic HFimpEF: TTE on 02/06/2024 with LVEF of 50 to  55% (was 30 to 35%), G1-DD and patent TAVR. History of aortic stenosis s/p TAVR. Coronary artery disease: Stable. -Continue Coreg .  Torsemide   discontinued due to AKI. - Reassess fluid status and renal function and adjust diuretics as appropriate   Hypothyroidism: Normal TSH with elevated free T4. -Reassess thyroid  panel in 4 to 6 weeks.   Essential hypertension: Normotensive off home amlodipine  and Imdur . - Continue Coreg .   Hyperlipidemia -Continue home Lipitor   Anemia of chronic disease: Stable. -Recheck CBC in 1 to 2 weeks   Anxiety: Stable. - Continue home Ativan.   Thrombocytopenia: Mild.  Improved. -Continue monitoring   Goal of care discussion: Patient, daughter and son had extensive goals of care discussion with previous attending and palliative medicine.  CODE STATUS changed to DNR -Palliative follow-up outpatient.   Severe malnutrition Body mass index is 23.36 kg/m. Nutrition Problem: Severe Malnutrition Etiology: cancer and cancer related treatments Signs/Symptoms: severe muscle depletion, severe fat depletion, energy intake < 75% for > 7 days Interventions: Refer to RD note for recommendations   DVT prophylaxis:  SCDs Start: 02/28/24 0154  Code Status: DNR Family Communication: Updated patient's daughter at bedside and son over the phone. Level of care: Telemetry Status is: Inpatient Remains inpatient appropriate because: SNF bed   Final disposition: SNF.  Medically stable for discharge.   55 minutes with more than 50% spent in reviewing records, counseling patient/family and coordinating care.  Consultants:  Oncology Interventional radiology Palliative medicine  Procedures: 2/3-right thoracocentesis with removal of 950 cc fluid 2/6-repeat right thoracocentesis with removal of 1.6 L fluid. 2/6-US  guided liver biopsy  Microbiology summarized: Urine culture with multiple species Pleural fluid culture NGTD  Objective: Vitals:   03/02/24 0900 03/02/24 0905 03/02/24 0910 03/02/24 1122  BP: 124/66 119/62 119/63 127/62  Pulse: 71 68 69   Resp: (!) 32 (!) 30 (!) 24   Temp:       TempSrc:      SpO2: 98% 97% 97%   Weight:      Height:        Examination:  GENERAL: No apparent distress.  Nontoxic.  Sitting on bedside chair. HEENT: MMM.  Vision and hearing grossly intact.  NECK: Supple.  No apparent JVD.  RESP:  No IWOB.  Diminished aeration in right lung (before thoracocentesis). CVS:  RRR. Heart sounds normal.  ABD/GI/GU: BS+. Abd soft, NTND.  RLQ urostomy.  Light pink urine in bag. MSK/EXT:  Moves extremities. No apparent deformity. No edema.  SKIN: no apparent skin lesion or wound NEURO: Awake and alert. Oriented appropriately.  No apparent focal neuro deficit. PSYCH: Calm. Normal affect.   Sch Meds:  Scheduled Meds:  amiodarone   200 mg Oral Daily   atorvastatin   10 mg Oral QHS   carvedilol   12.5 mg Oral BID   feeding supplement  237 mL Oral BID BM   levothyroxine   200 mcg Oral QAC breakfast   lidocaine -EPINEPHrine   20 mL Intradermal Once   mirtazapine   7.5 mg Oral QHS   multivitamin with minerals  1 tablet Oral Daily   sodium bicarbonate   1,300 mg Oral BID   sodium zirconium cyclosilicate   10 g Oral Once   thiamine   100 mg Oral Daily   [START ON 03/06/2024] Vitamin D  (Ergocalciferol )  50,000 Units Oral Q7 days   Continuous Infusions: PRN Meds:.acetaminophen  **OR** acetaminophen , chlorproMAZINE , guaiFENesin , hydrALAZINE , HYDROcodone -acetaminophen , ipratropium-albuterol , metoprolol  tartrate, ondansetron  **OR** ondansetron  (ZOFRAN ) IV, senna-docusate  Antimicrobials: Anti-infectives (From admission, onward)    None  I have personally reviewed the following labs and images: CBC: Recent Labs  Lab 02/27/24 1449 02/28/24 0250 02/29/24 0439 03/02/24 0335  WBC 6.9 4.3 4.3 3.7*  NEUTROABS  --   --   --  1.8  HGB 9.9* 8.0* 8.9* 9.0*  HCT 33.9* 25.9* 28.2* 28.5*  MCV 97.7 92.8 91.0 89.6  PLT 124* 114* 118* 120*   BMP &GFR Recent Labs  Lab 02/27/24 1733 02/28/24 0250 02/29/24 0439 03/02/24 0335  NA 141 140 141 136  K 4.6 4.2  4.5 4.8  CL 108 108 111 107  CO2 21* 22 22 21*  GLUCOSE 172* 95 98 103*  BUN 57* 60* 49* 42*  CREATININE 3.27* 3.02* 2.51* 2.45*  CALCIUM  8.9 8.4* 8.5* 8.3*  MG  --  1.9 1.9  --   PHOS  --  3.2  --   --    Estimated Creatinine Clearance: 25.4 mL/min (A) (by C-G formula based on SCr of 2.45 mg/dL (H)). Liver & Pancreas: Recent Labs  Lab 02/27/24 1733 02/28/24 0250  AST 41 40  ALT 47* 44  ALKPHOS 72 64  BILITOT 0.4 0.4  PROT 7.0 5.4*  ALBUMIN  3.2* 2.9*   No results for input(s): LIPASE, AMYLASE in the last 168 hours. No results for input(s): AMMONIA in the last 168 hours. Diabetic: No results for input(s): HGBA1C in the last 72 hours. No results for input(s): GLUCAP in the last 168 hours. Cardiac Enzymes: No results for input(s): CKTOTAL, CKMB, CKMBINDEX, TROPONINI in the last 168 hours. Recent Labs    02/27/24 1838  PROBNP 950.0*   Coagulation Profile: Recent Labs  Lab 03/02/24 0335  INR 1.1   Thyroid  Function Tests: No results for input(s): TSH, T4TOTAL, FREET4, T3FREE, THYROIDAB in the last 72 hours. Lipid Profile: No results for input(s): CHOL, HDL, LDLCALC, TRIG, CHOLHDL, LDLDIRECT in the last 72 hours. Anemia Panel: No results for input(s): VITAMINB12, FOLATE, FERRITIN, TIBC, IRON, RETICCTPCT in the last 72 hours. Urine analysis:    Component Value Date/Time   COLORURINE RED (A) 02/27/2024 1533   APPEARANCEUR TURBID (A) 02/27/2024 1533   LABSPEC  02/27/2024 1533    TEST NOT REPORTED DUE TO COLOR INTERFERENCE OF URINE PIGMENT   PHURINE  02/27/2024 1533    TEST NOT REPORTED DUE TO COLOR INTERFERENCE OF URINE PIGMENT   GLUCOSEU (A) 02/27/2024 1533    TEST NOT REPORTED DUE TO COLOR INTERFERENCE OF URINE PIGMENT   HGBUR (A) 02/27/2024 1533    TEST NOT REPORTED DUE TO COLOR INTERFERENCE OF URINE PIGMENT   BILIRUBINUR (A) 02/27/2024 1533    TEST NOT REPORTED DUE TO COLOR INTERFERENCE OF URINE PIGMENT    KETONESUR (A) 02/27/2024 1533    TEST NOT REPORTED DUE TO COLOR INTERFERENCE OF URINE PIGMENT   PROTEINUR (A) 02/27/2024 1533    TEST NOT REPORTED DUE TO COLOR INTERFERENCE OF URINE PIGMENT   UROBILINOGEN 0.2 07/17/2013 1146   NITRITE (A) 02/27/2024 1533    TEST NOT REPORTED DUE TO COLOR INTERFERENCE OF URINE PIGMENT   LEUKOCYTESUR (A) 02/27/2024 1533    TEST NOT REPORTED DUE TO COLOR INTERFERENCE OF URINE PIGMENT   Sepsis Labs: Invalid input(s): PROCALCITONIN, LACTICIDVEN  Microbiology: Recent Results (from the past 240 hours)  Urine Culture (for pregnant, neutropenic or urologic patients or patients with an indwelling urinary catheter)     Status: Abnormal   Collection Time: 02/27/24  3:33 PM   Specimen: Urine, Clean Catch  Result Value Ref Range Status  Specimen Description URINE, CLEAN CATCH  Final   Special Requests   Final    Normal Performed at [facility], [address], [place], [place] [address]    Culture MULTIPLE SPECIES PRESENT, SUGGEST RECOLLECTION (A)  Final   Report Status 02/28/2024 FINAL  Final  Body fluid culture w Gram Stain     Status: None   Collection Time: 02/28/24  1:47 PM   Specimen: Lung, Right; Pleural Fluid  Result Value Ref Range Status   Specimen Description FLUID PLEURAL RIGHT  Final   Special Requests NONE  Final   Gram Stain NO WBC SEEN NO ORGANISMS SEEN   Final   Culture   Final    NO GROWTH 3 DAYS Performed at [facility], [address] [place], [place] [address]    Report Status 03/02/2024 FINAL  Final    Radiology Studies: US  BIOPSY (LIVER) Result Date: 03/02/2024 INDICATION: Multiple liver lesions suspicious for malignancy. EXAM: Ultrasound-guided biopsy MEDICATIONS: None. ANESTHESIA/SEDATION: Moderate (conscious) sedation was employed during this procedure. A total of Versed  1.5 mg and Fentanyl  50 mcg was administered intravenously by the radiology nurse. Total intra-service moderate Sedation Time: 15 minutes.  The patient's level of consciousness and vital signs were monitored continuously by radiology nursing throughout the procedure under my direct supervision. COMPLICATIONS: None immediate. PROCEDURE: Informed written consent was obtained from the patient after a thorough discussion of the procedural risks, benefits and alternatives. All questions were addressed. Maximal Sterile Barrier Technique was utilized including caps, mask, sterile gowns, sterile gloves, sterile drape, hand hygiene and skin antiseptic. A timeout was performed prior to the initiation of the procedure. In a supine position the epigastric region was evaluated with ultrasound. Numerous liver lesions are again identified and compared with the previous study (CT scan abdomen and pelvis). The most amenable nodule 2 percutaneous biopsy was then selected. The patient's skin was prepped and draped in the usual sterile fashion. Local anesthesia was achieved with 1% lidocaine  by infiltrating the skin to the level of the liver capsule under ultrasound guidance. A small incision was made in the epigastric region. The 17 gauge introducer was then advanced through the incision into the lesion in question. The stylet was removed and the 18 gauge BioPince needle was then advanced into the lesion. Two sequential samples were then obtained of the nodule within the left lobe of the liver. Samples sent to pathology. All needles removed from the patient. Sterile dressing applied. IMPRESSION: Satisfactory core needle biopsy x2 left lobe liver lesion. Electronically Signed   By: Cordella Banner   On: 03/02/2024 13:31      Sakira Dahmer T. Elena Cothern [facility]  If 7PM-7AM, please contact night-coverage [email or web address] 03/02/2024, 3:23 PM   "

## 2024-03-02 NOTE — Progress Notes (Signed)
 Speech Language Pathology Treatment: Dysphagia  Patient Details Name: Jordan Oconnell MRN: [id number] DOB: 1940-09-10 Today's Date: 03/02/2024 Time: 1013-1050 SLP Time Calculation (min) (ACUTE ONLY): 37 min  Assessment / Plan / Recommendation Clinical Impression  PLAN: Continue regular/thin liquids with strict adherence to posted safe swallow precautions.  Pt seen for skilled ST intervention targeting goals for swallow safety. Pt's daughter at bedside. RN reported coughing with meds following IR procedure today. Pt seated upright in bed, and accepted trials of thin liquid and saltines/peanut butter. Pt was receptive to education re: importance of adherence to posted safe swallow precautions, specifically small bites/sips at slow rate, upright position during and for 30 minutes after PO intake.  No immediate cough elicited after PO presentations. Pt did exhibit coughing several minutes after PO trials had ended, which is not suspected to be related to PO intake. Pt reports no difficulty when adhering to precautions, and does not feel the need to proceed with MBS at this time. Will continue current diet and follow up once more for education. Dtr reports plan for DC to rehab soon - recommend follow up with speech therapy at next venue of care.   HPI HPI: 84yo male admitted from home 02/27/24 with weakness and GI problem. CXR: mod right pleural effusion, likely basilar atelectasis, pathy RML opacification likely atelectasis, RLL infection possible. CTChest: lung mets. BSE 02/2022: Reg/thin, meds whole/liquid. PMH: admit 1/15-1/28/26 for anemia/hematuria 2/2 bladder cancer, metastatic bladder/urothelial cancer, s/p cystectomy and partial R nephrectomy, total nephrectomy, GERD, HLD, HTN, AFib, hypothyroidism, CKD, tachybradycardia, depression, anemia, AKI, CKD.      SLP Plan  Continue with current plan of care         Recommendations  Diet recommendations: Regular;Thin liquid Liquids provided via:  Straw;Cup Medication Administration: Whole meds with liquid Supervision: Patient able to self feed;Staff to assist with self feeding;Intermittent supervision to cue for compensatory strategies Compensations: Minimize environmental distractions;Slow rate;Small sips/bites Postural Changes and/or Swallow Maneuvers: Seated upright 90 degrees;Upright 30-60 min after meal           Oral care BID   Frequent or constant Supervision/Assistance Dysphagia, unspecified (R13.10)     Continue with current plan of care    Paislea Hatton B. Dory, MSP, CCC-SLP Speech Language Pathologist Office: [phone]  Dory Caprice Daring 03/02/2024, 10:58 AM

## 2024-03-02 NOTE — Progress Notes (Signed)
 SLP Cancellation Note  Patient Details Name: DELANO FRATE MRN: [id number] DOB: 1940-01-28   Cancelled treatment:       Reason Eval/Treat Not Completed: Other (comment) Unable to assess diet tolerance at this time, as pt is currently NPO for IR procedure. Will continue efforts.   Muath Hallam B. Dory, MSP, CCC-SLP Speech Language Pathologist Office: [phone]  Dory Caprice Daring 03/02/2024, 9:23 AM

## 2024-03-02 NOTE — Procedures (Addendum)
 PROCEDURE SUMMARY:  Successful US  guided diagnostic and therapeutic thoracentesis. Yielded 1.6 liters of sanguineous fluid. Patient tolerated procedure well. No immediate complications. EBL = trace  Specimen sent for labs.  Post procedure chest X-ray ordered and obtained prior to departure from department. CXR reviewed by Dr. Karalee confirming no pneumothorax.   Leanor JINNY Forget PA-C 03/02/2024 1:39 PM

## 2024-03-02 NOTE — Procedures (Signed)
 Pre procedural Dx: liver lesions  Post procedural Dx: Same  Technically successful US  guided biopsy of liver lesion   EBL: None.   Complications: None immediate.   KANDICE Banner, MD Pager #: [phone]

## 2024-03-02 NOTE — TOC Progression Note (Addendum)
 Transition of Care [facility]) - Progression Note    Patient Details  Name: SEAN MALINOWSKI MRN: [id number] Date of Birth: 01-10-1941  Transition of [facility]) CM/SW Contact  Lendia Dais, [place] Phone Number: 03/02/2024, 3:49 PM  Clinical Narrative: Pt is medically stable for discharge. Pt chose bed a [facility]. CSW spoke to [name] and stated that a bed would not be available until Monday/Tuesday. [facility] is pending for SNF, Auth ID is [id number]. MD notified.  CSW will continue to follow                      Expected Discharge Plan and Services         Expected Discharge Date: 03/02/24                                     Social Drivers of Health (SDOH) Interventions SDOH Screenings   Food Insecurity: No Food Insecurity (02/28/2024)  Housing: Low Risk (02/28/2024)  Transportation Needs: No Transportation Needs (02/28/2024)  Utilities: Not At Risk (02/28/2024)  Social Connections: Unknown (02/28/2024)  Tobacco Use: Low Risk (03/01/2024)    Readmission Risk Interventions    05/04/2023   10:12 AM 03/08/2023   12:36 PM  Readmission Risk Prevention Plan  Transportation Screening Complete Complete  PCP or Specialist Appt within 3-5 Days Complete Complete  HRI or Home Care Consult Complete Complete  Social Work Consult for Recovery Care Planning/Counseling Complete Complete  Palliative Care Screening Not Applicable Not Applicable  Medication Review Oceanographer) Complete Complete

## 2024-03-02 NOTE — Progress Notes (Signed)
 IP PROGRESS NOTE  Subjective:   [name] was out of bed yesterday.  He is eating.  His daughter was at the bedside when I saw him at approximately 7 AM.  No pain.  No new complaint.  He plans to be discharged to a skilled nursing facility.  Objective: Vital signs in last 24 hours: Blood pressure 118/68, pulse 75, temperature 98 F (36.7 C), resp. rate 18, height 6' 1 (1.854 m), weight 177 lb 0.5 oz (80.3 kg), SpO2 96%.  Intake/Output from previous day: 02/05 0701 - 02/06 0700 In: 420 [P.O.:420] Out: 1550 [Urine:1550]  Physical Exam: HEENT: Dry secretions over the tongue, no thrush Lungs: Decreased breath sounds at the right lower posterior chest, dullness at the right lower chest Cardiac: Regular rhythm, distant heart sounds Abdomen: No hepatosplenomegaly, right abdomen urostomy with blood-tinged urine Extremities: No leg edema   Lab Results: Recent Labs    02/29/24 0439 03/02/24 0335  WBC 4.3 3.7*  HGB 8.9* 9.0*  HCT 28.2* 28.5*  PLT 118* 120*    BMET Recent Labs    02/29/24 0439 03/02/24 0335  NA 141 136  K 4.5 4.8  CL 111 107  CO2 22 21*  GLUCOSE 98 103*  BUN 49* 42*  CREATININE 2.51* 2.45*  CALCIUM  8.5* 8.3*    No results found for: CEA1, CEA, CAN199, CA125  Studies/Results: DG Chest 2 View Result Date: 03/01/2024 CLINICAL DATA:  Pleural effusion EXAM: CHEST - 2 VIEW COMPARISON:  Two days ago FINDINGS: Stable cardiomegaly. Status post transcatheter aortic valve repair. Left-sided pacemaker is unchanged. Left lung is clear. Increased right pleural effusion is noted with associated atelectasis. Old distal right clavicular fracture is noted. IMPRESSION: Increased right pleural effusion is noted with associated atelectasis. Electronically Signed   By: Lynwood Landy Raddle M.D.   On: 03/01/2024 09:55    Medications: I have reviewed the patient's current medications.  Assessment/Plan: Right ureter cancer Initial diagnosis in 2012, treated with  URS 02/26/2013 right complete ureterectomy 11/20/2020, urothelial thickening with masslike enhancement of the right renal pelvis on CT, focal areas of enhancing soft tissue in the proximal right ileal ureter 04/13/2021 right nephroscopy, diffuse disease of the right renal pelvis, papillary urothelial carcinoma, high-grade, noninvasive-muscularis propria not present 06/09/2021 pembrolizumab -04/21/2022 10/22/2021 CTs, right renal pelvis mass increased from 2.6 to 3.8 cm 12/20/2021 - 12/04/2021, right renal radiation 3000 cGy in 5 fractions Foundation 1: TMB 14, positive FGFR3 mutation, ATM mutation, TERT promoter mutation, BRCA1 duplication 05/06/2022-right renal pelvis mass less prominent 09/04/2022, decreasing inflammatory changes at the right renal pelvis with mild persistent urothelial thickening 02/08/2014 CT abdomen/pelvis: Progressive high attenuation material in the right renal collecting system and upper right ureter with a masslike area measuring 3 cm, new retroperitoneal and right inguinal lymphadenopathy, new hepatic lesions consistent with metastases, moderate right pleural effusion 02/27/2024 CT chest: Right pleural-based soft tissue nodules, moderate right pleural effusion 02/28/2024 right thoracentesis-atypical cells   2.  Bladder cancer March 2008 TURBT: Chronic cystitis November 2009 TURBT: Papillary hyperplasia December 2009 BCG: 6-week induction 06/21/2008 TURBT, TA, LG, prostatic urethral involvement July 2010 BCG 6-week induction 12/06/2008 TURBT: TA, LG December 2010 BCG 6-week induction 08/25/2002 TURBT TA, H G 09/06/2012 PET/CT: Pelvic lymphadenopathy 02/26/2013 cystoprostatectomy plus reversed 7 diversion, pTis,N2,M0,R0 06/25/2008 revision reverse 7 urinary diversion 07/05/2019 MRI: NAD, stable right adrenal cyst, chest x-ray: Stable, NED 07/09/2020 CT CAP CAP plus chest x-ray-NAD      3.  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   Hematuria, likely secondary to tumor involving the right renal pelvis 5.   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   Paroxysmal atrial fibrillation-currently maintained off of anticoagulation 7.   Aortic stenosis, status post TAVR 8.   Renal insufficiency 9.  History of CAD/CHF 10.  Left renal cell carcinoma, June 2008 left partial nephrectomy, clear-cell histology     [name] appears unchanged.  He is scheduled for biopsy of a liver lesion today.  He will also undergo a repeat thoracentesis.  We will discuss systemic treatment options when there is a tissue diagnosis.  He continues to have hematuria.  The hemoglobin is stable.  [name] will be scheduled for outpatient follow-up at the cancer center. Recommendations:  Biopsy of a liver lesion Repeat therapeutic/diagnostic thoracentesis Monitor hemoglobin periodically, transfuse packed red blood cells as needed for symptomatic anemia Skilled nursing facility placement Please call oncology as needed, outpatient follow-up will be scheduled at the cancer center, I will check on him 03/05/2024 if he remains in the hospital   LOS: 3 days   Arley Hof, MD   03/02/2024, 8:36 AM

## 2024-03-02 NOTE — Progress Notes (Signed)
 OT Cancellation Note  Patient Details Name: Jordan Oconnell MRN: [id number] DOB: 1940-09-12   Cancelled Treatment:    Reason Eval/Treat Not Completed: Other (comment).  Attempted skilled OT treatment session with dtr. [place]) present.  Pt. And dtr. Report pt. Is very tired from multiple procedures today and has also been up to chair and the b.room x2.  Request to rest this afternoon.  Reviewed will check back as schedule permits over wknd or early next week. Both verbalized understanding.   CHRISTELLA Nest Lorraine-COTA/L  03/02/2024, 2:39 PM

## 2024-03-02 NOTE — Plan of Care (Signed)

## 2024-04-30 ENCOUNTER — Ambulatory Visit: Admitting: Cardiovascular Disease

## 2024-05-22 ENCOUNTER — Ambulatory Visit

## 2024-05-29 ENCOUNTER — Ambulatory Visit

## 2024-08-21 ENCOUNTER — Ambulatory Visit

## 2024-08-28 ENCOUNTER — Ambulatory Visit

## 2024-11-20 ENCOUNTER — Ambulatory Visit

## 2024-11-27 ENCOUNTER — Ambulatory Visit

## 2025-02-19 ENCOUNTER — Ambulatory Visit
